# Patient Record
Sex: Female | Born: 1985 | Race: Black or African American | Hispanic: No | Marital: Single | Smoking: Former smoker
Health system: Southern US, Community
[De-identification: ages and names within clinical notes are randomized; demographics above are authoritative.]

## PROBLEM LIST (undated history)

## (undated) DIAGNOSIS — I509 Heart failure, unspecified: Secondary | ICD-10-CM

## (undated) DIAGNOSIS — N049 Nephrotic syndrome with unspecified morphologic changes: Secondary | ICD-10-CM

## (undated) DIAGNOSIS — I10 Essential (primary) hypertension: Secondary | ICD-10-CM

## (undated) DIAGNOSIS — I739 Peripheral vascular disease, unspecified: Secondary | ICD-10-CM

## (undated) DIAGNOSIS — N183 Chronic kidney disease, stage 3 unspecified: Secondary | ICD-10-CM

## (undated) DIAGNOSIS — D638 Anemia in other chronic diseases classified elsewhere: Secondary | ICD-10-CM

## (undated) DIAGNOSIS — J45909 Unspecified asthma, uncomplicated: Secondary | ICD-10-CM

## (undated) DIAGNOSIS — R0600 Dyspnea, unspecified: Secondary | ICD-10-CM

## (undated) DIAGNOSIS — E119 Type 2 diabetes mellitus without complications: Secondary | ICD-10-CM

## (undated) HISTORY — DX: Heart failure, unspecified: I50.9

## (undated) HISTORY — PX: OTHER SURGICAL HISTORY: SHX169

## (undated) HISTORY — PX: TONSILLECTOMY: SUR1361

---
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
## 2004-08-01 ENCOUNTER — Emergency Department: Payer: Self-pay | Admitting: General Practice

## 2005-01-08 ENCOUNTER — Emergency Department: Payer: Self-pay | Admitting: Unknown Physician Specialty

## 2006-01-27 ENCOUNTER — Emergency Department: Payer: Self-pay | Admitting: Emergency Medicine

## 2006-03-01 ENCOUNTER — Emergency Department: Payer: Self-pay | Admitting: General Practice

## 2008-08-07 ENCOUNTER — Emergency Department: Payer: Self-pay | Admitting: Emergency Medicine

## 2010-11-03 ENCOUNTER — Emergency Department: Payer: Self-pay | Admitting: Emergency Medicine

## 2011-04-23 ENCOUNTER — Emergency Department: Payer: Self-pay | Admitting: Internal Medicine

## 2011-06-09 ENCOUNTER — Emergency Department: Payer: Self-pay | Admitting: Emergency Medicine

## 2011-06-09 IMAGING — US US EXTREM LOW VENOUS*L*
1 series · 17 of 24 positions shown · non-contrast
Comparison: none

REASON FOR EXAM: marked edema
COMMENTS:   LMP: Four weeks ago

PROCEDURE:     US  - US DOPPLER LOW EXTR LEFT  - [DATE]  [DATE]
RESULT:     Comparison: None

[Series 1: us extrem low venous*left* · 17 of 26 slices shown]
[im 1/26]
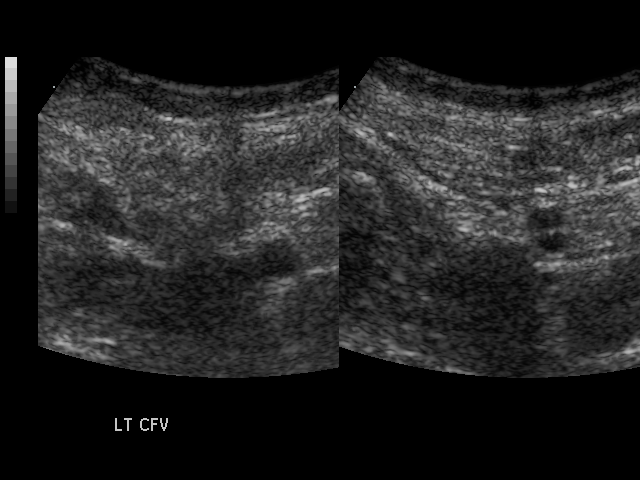
[im 3/26]
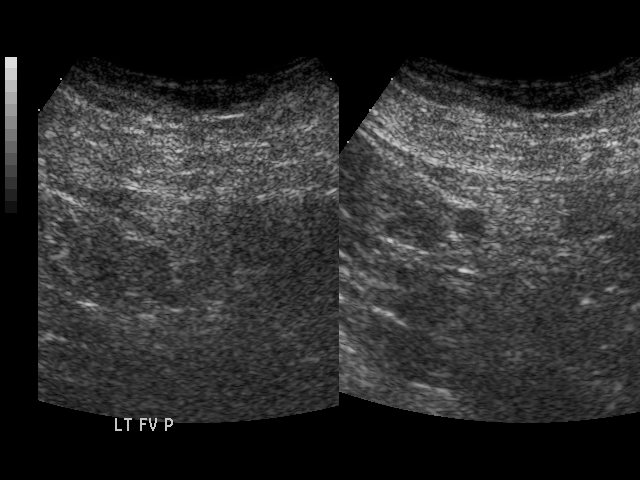
[im 4/26]
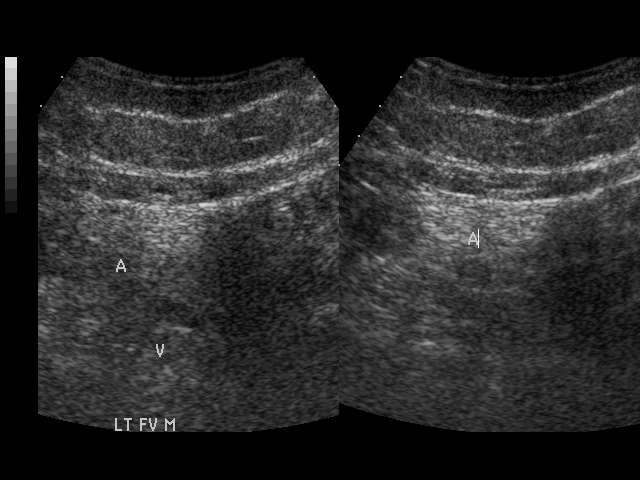
[im 5/26]
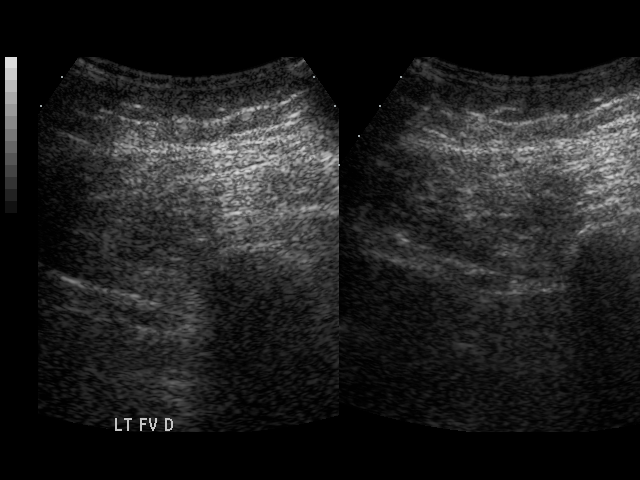
[im 7/26]
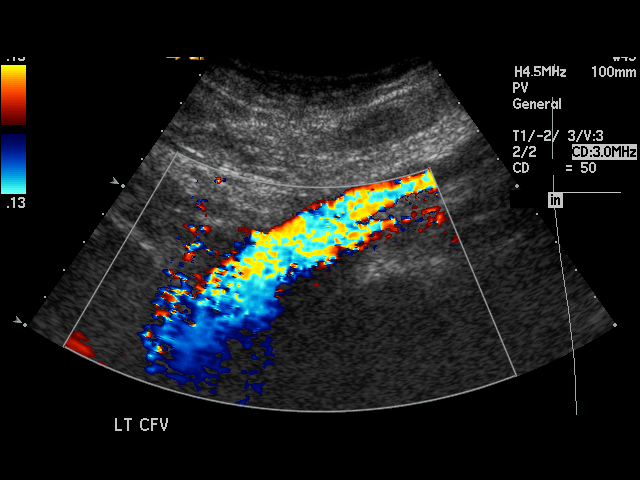
[im 8/26]
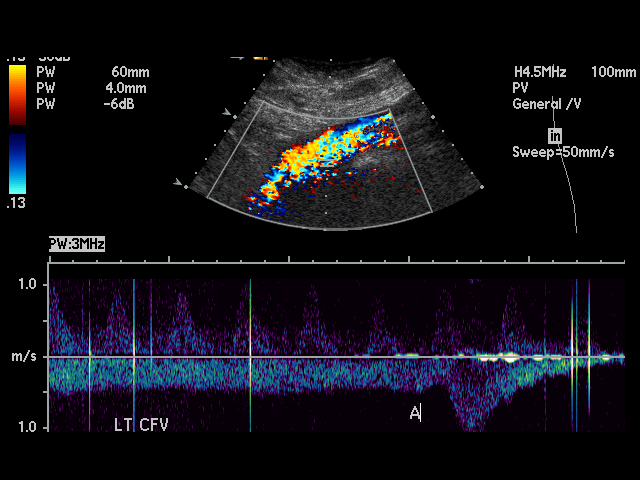
[im 10/26]
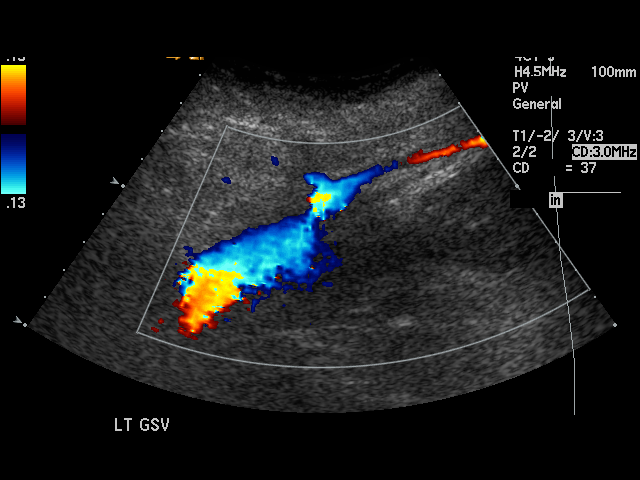
[im 11/26]
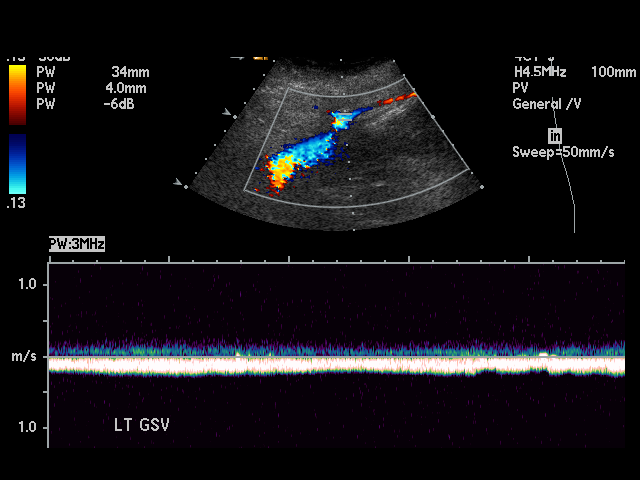
[im 14/26]
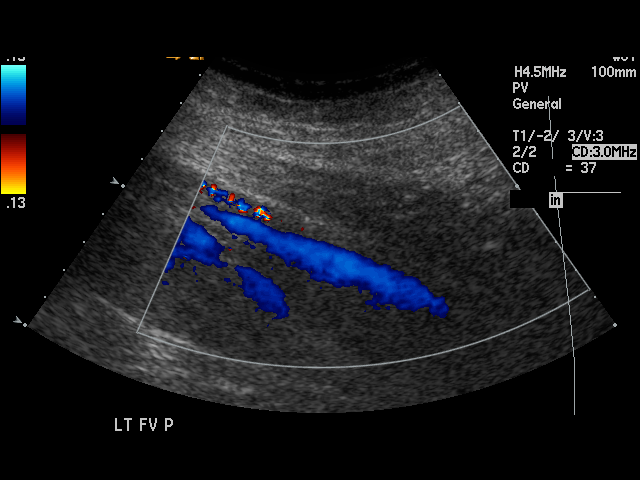
[im 15/26]
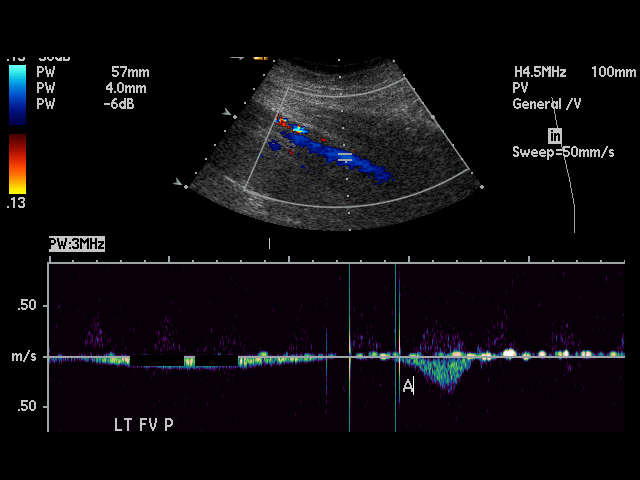
[im 16/26]
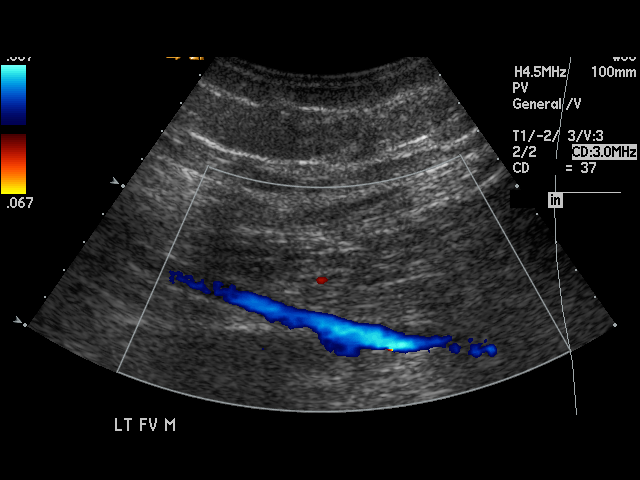
[im 18/26]
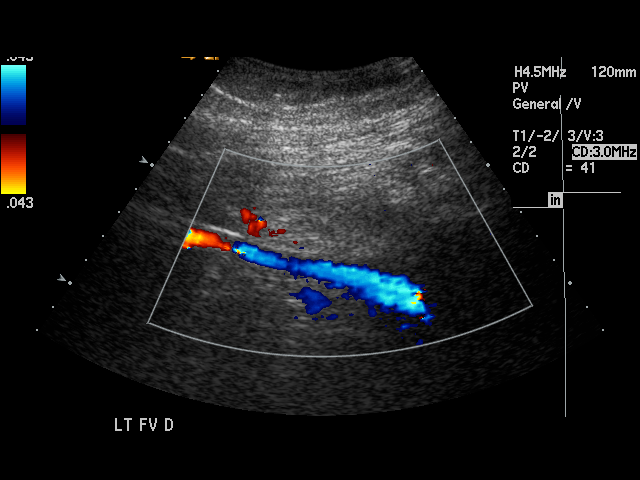
[im 19/26]
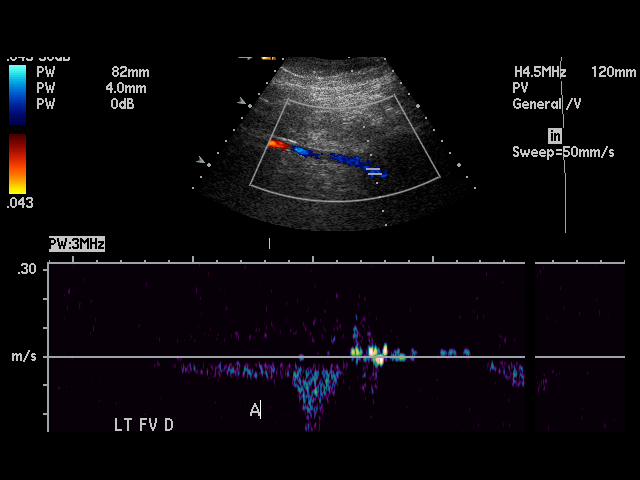
[im 21/26]
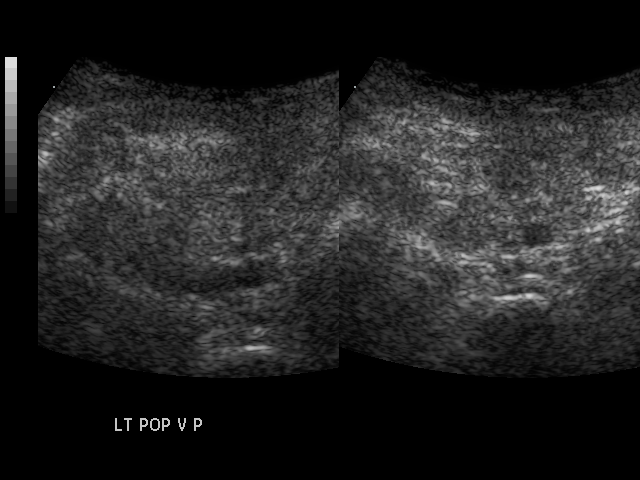
[im 22/26]
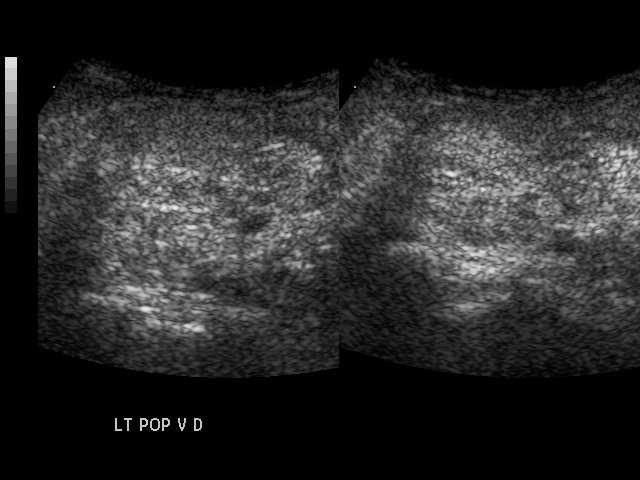
[im 23/26]
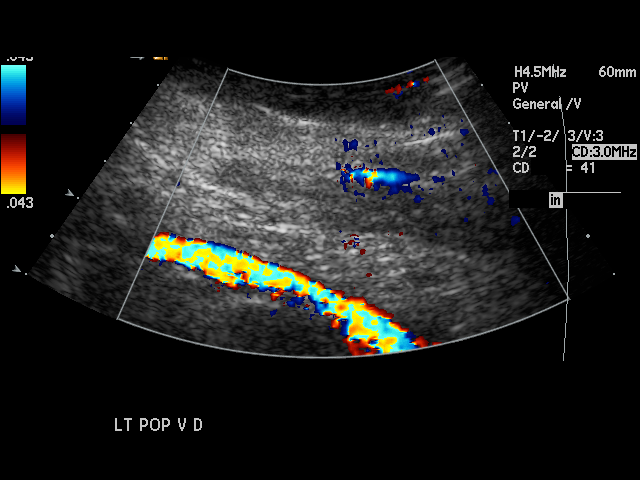
[im 26/26]
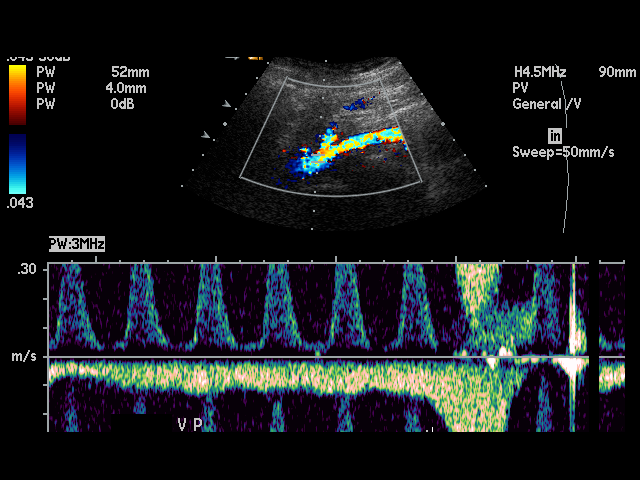

[17 of 24 positions shown; findings below may reference images not displayed]

FINDINGS: Multiple longitudinal and transverse gray-scale as well as color
and spectral Doppler images of the left lower extremity veins were obtained
from the common femoral veins through the popliteal veins.

The left common femoral, greater saphenous, femoral, popliteal veins, and
venous trifurcation are patent, demonstrating normal color-flow and
compressibility. No intraluminal thrombus is identified.There is normal
respiratory variation and augmentation demonstrated at all vein levels.
IMPRESSION: No evidence of DVT in the left lower extremity.

## 2011-07-02 ENCOUNTER — Emergency Department: Payer: Self-pay | Admitting: Emergency Medicine

## 2011-07-02 IMAGING — US US EXTREM LOW VENOUS*L*
1 series · 18 of 21 positions shown · non-contrast
Comparison: none

REASON FOR EXAM: Persistent pain, swelling
COMMENTS:

PROCEDURE:     US  - US DOPPLER LOW EXTR LEFT  - [DATE]  [DATE]
RESULT:     Left lower extremity color flow duplex Doppler reveals no
evidence of venous thrombosis.

[Series 1: us extrem low venous*left* · 18 of 21 slices shown]
[im 1/21]
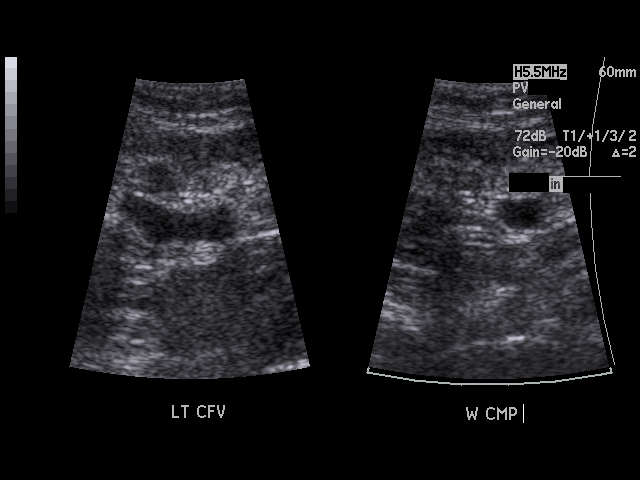
[im 2/21]
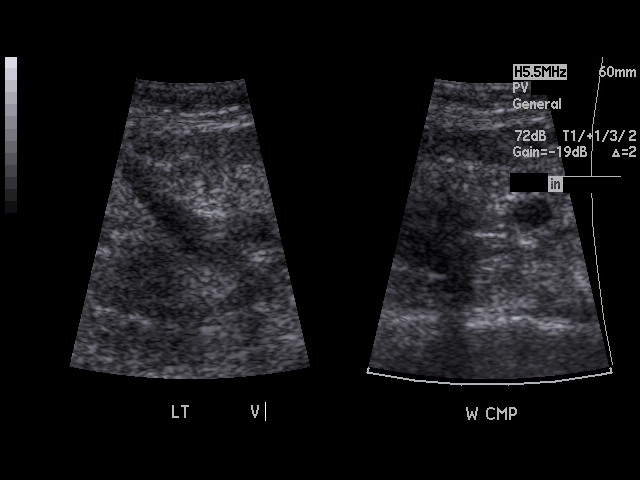
[im 3/21]
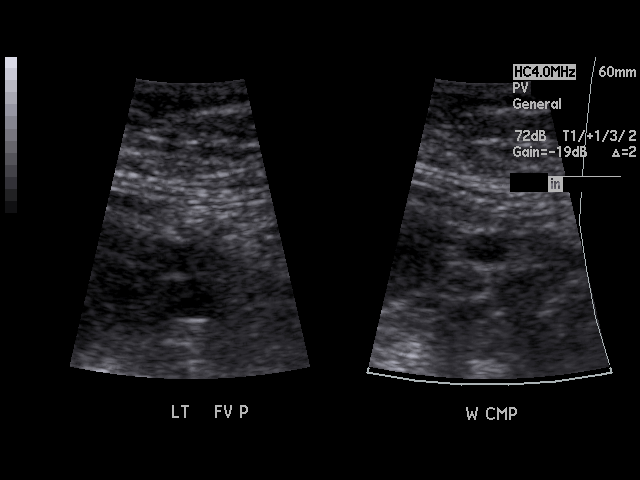
[im 5/21]
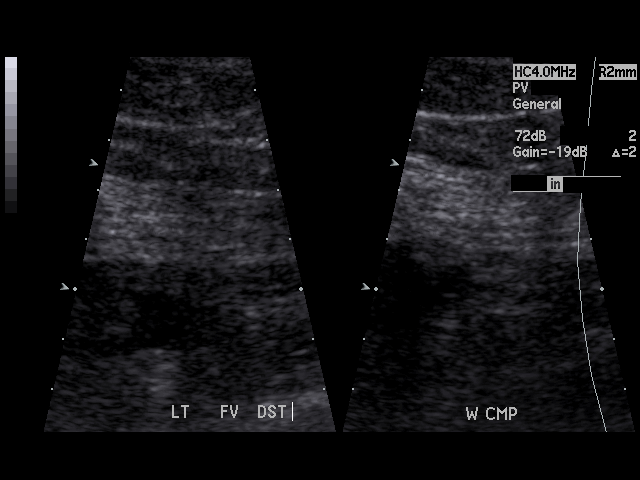
[im 6/21]
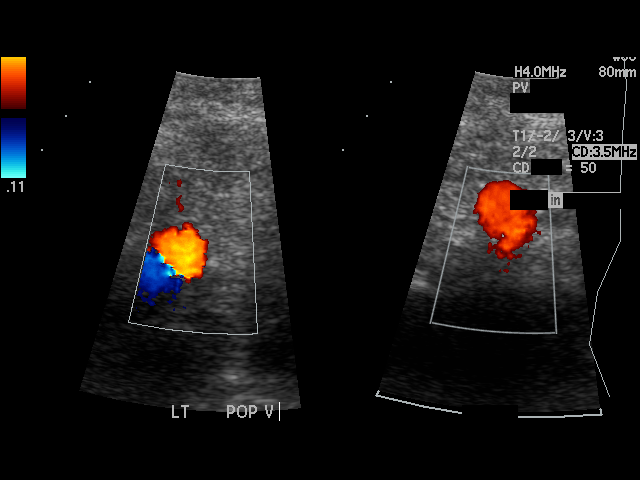
[im 7/21]
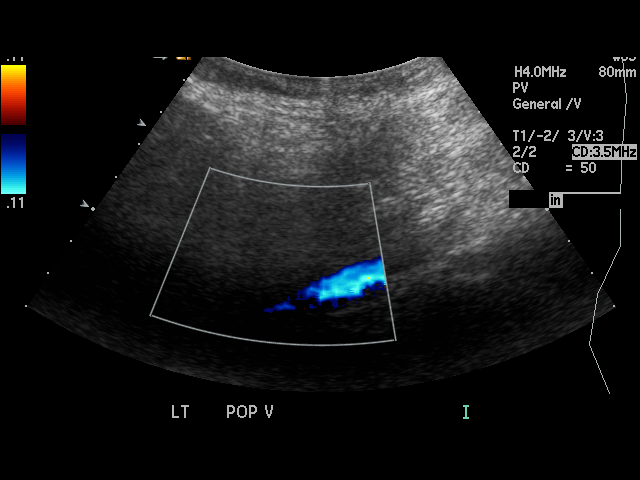
[im 8/21]
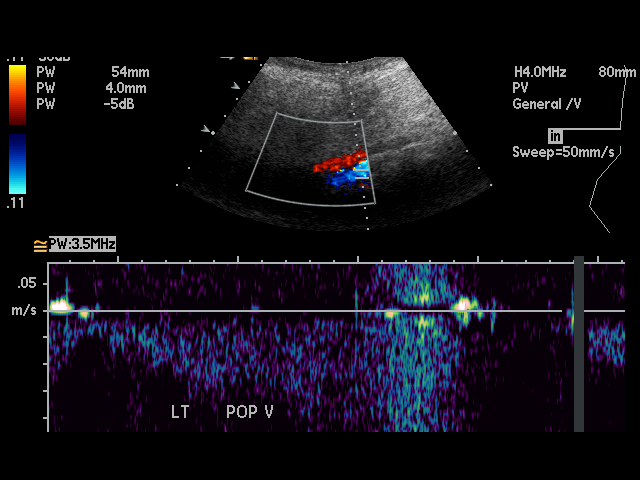
[im 9/21]
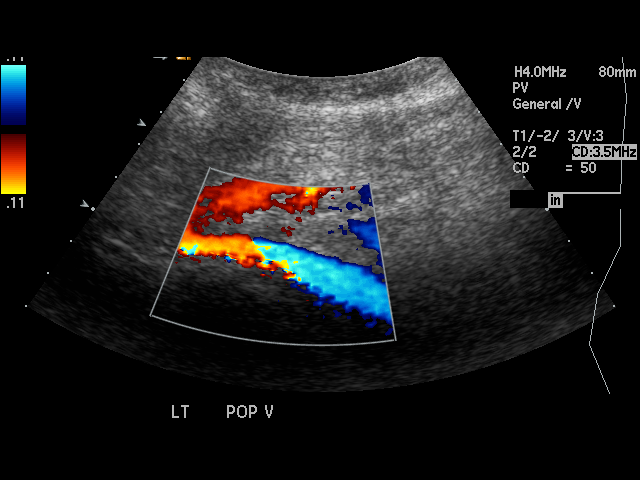
[im 10/21]
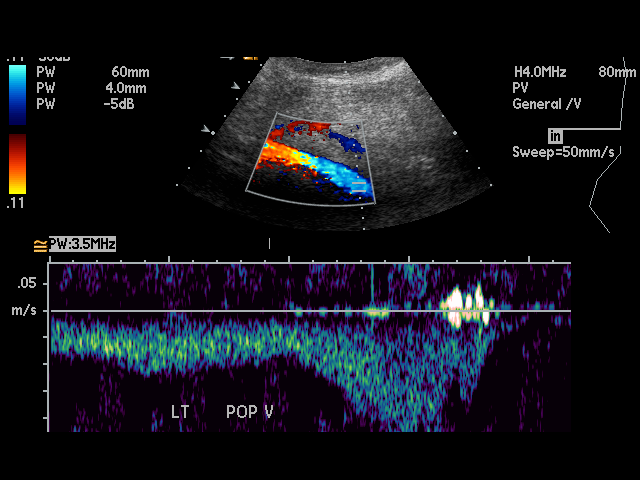
[im 12/21]
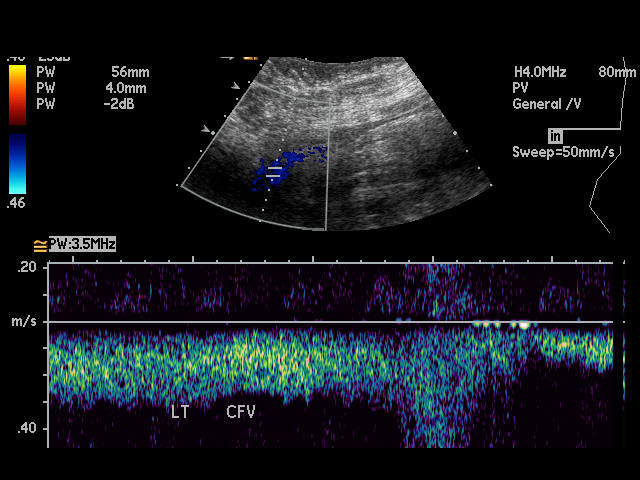
[im 13/21]
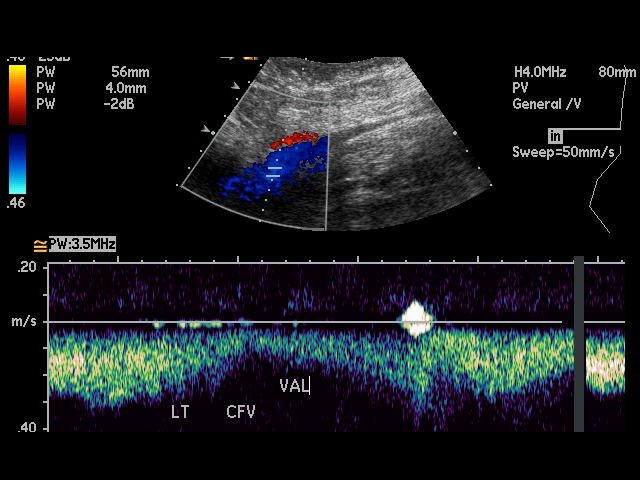
[im 14/21]
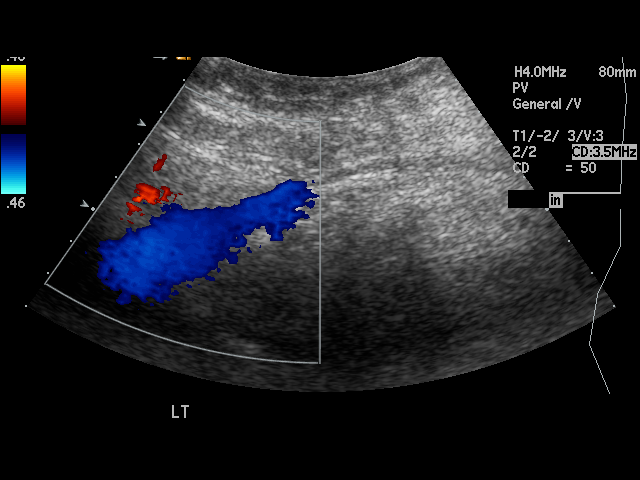
[im 15/21]
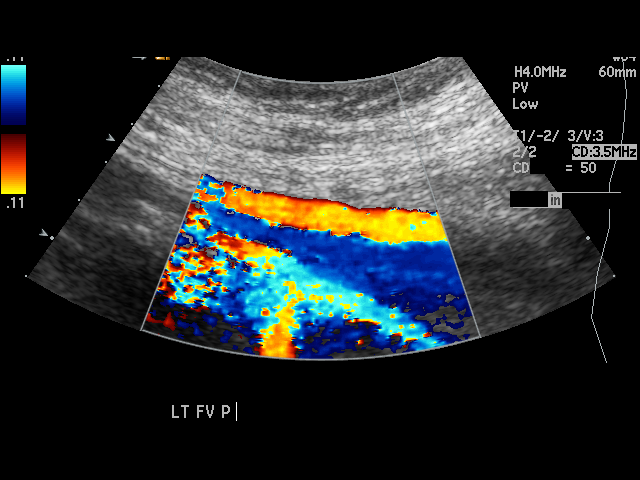
[im 16/21]
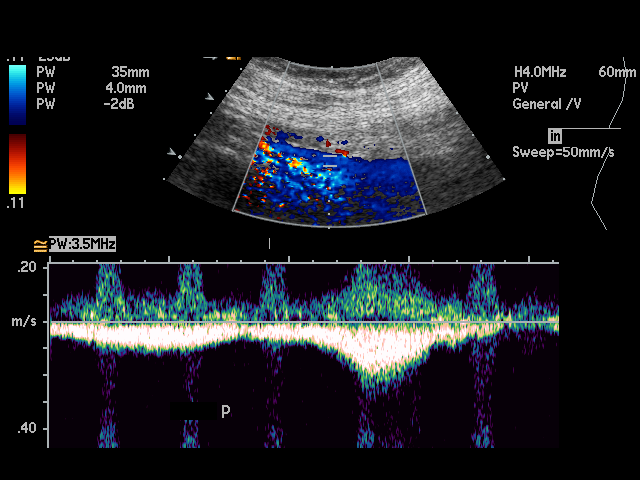
[im 17/21]
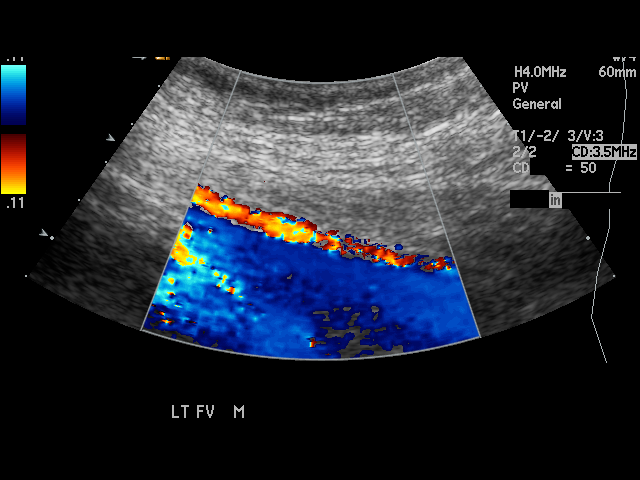
[im 19/21]
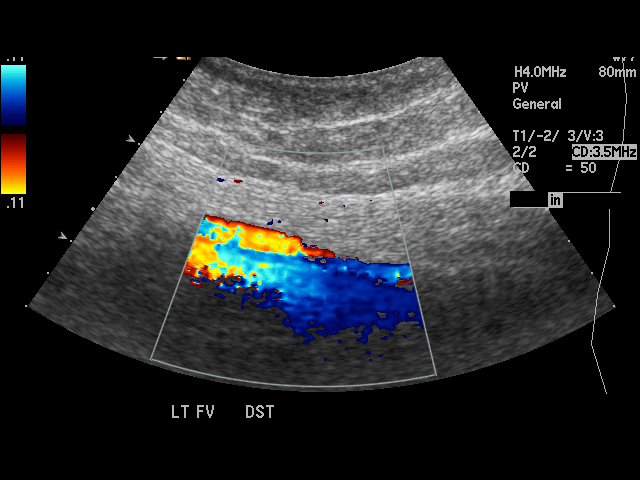
[im 20/21]
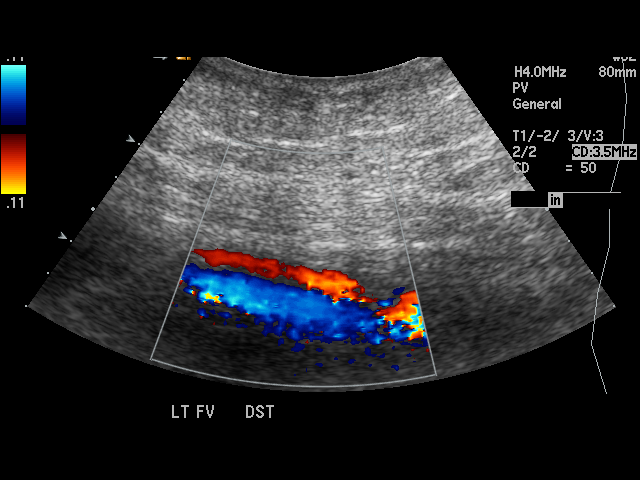
[im 21/21]
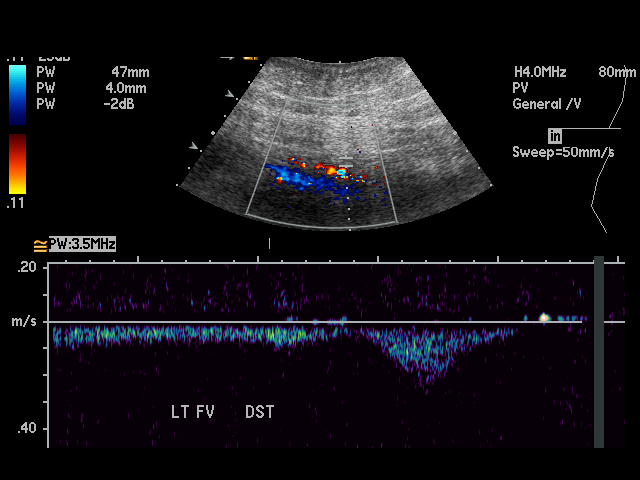

[18 of 21 positions shown; findings below may reference images not displayed]

IMPRESSION: Negative exam.

## 2011-08-08 ENCOUNTER — Emergency Department: Payer: Self-pay | Admitting: Emergency Medicine

## 2013-02-26 ENCOUNTER — Emergency Department: Payer: Self-pay | Admitting: Emergency Medicine

## 2013-12-31 ENCOUNTER — Emergency Department: Payer: Self-pay | Admitting: Emergency Medicine

## 2013-12-31 LAB — COMPREHENSIVE METABOLIC PANEL
ANION GAP: 4 — AB (ref 7–16)
Albumin: 3.1 g/dL — ABNORMAL LOW (ref 3.4–5.0)
Alkaline Phosphatase: 98 U/L
BILIRUBIN TOTAL: 0.4 mg/dL (ref 0.2–1.0)
BUN: 19 mg/dL — AB (ref 7–18)
CREATININE: 2.12 mg/dL — AB (ref 0.60–1.30)
Calcium, Total: 8.5 mg/dL (ref 8.5–10.1)
Chloride: 105 mmol/L (ref 98–107)
Co2: 26 mmol/L (ref 21–32)
EGFR (African American): 36 — ABNORMAL LOW
EGFR (Non-African Amer.): 31 — ABNORMAL LOW
Glucose: 243 mg/dL — ABNORMAL HIGH (ref 65–99)
OSMOLALITY: 280 (ref 275–301)
POTASSIUM: 3.9 mmol/L (ref 3.5–5.1)
SGOT(AST): 21 U/L (ref 15–37)
SGPT (ALT): 26 U/L (ref 12–78)
SODIUM: 135 mmol/L — AB (ref 136–145)
Total Protein: 7.4 g/dL (ref 6.4–8.2)

## 2013-12-31 LAB — CBC
HCT: 38.5 % (ref 35.0–47.0)
HGB: 12.5 g/dL (ref 12.0–16.0)
MCH: 27.5 pg (ref 26.0–34.0)
MCHC: 32.6 g/dL (ref 32.0–36.0)
MCV: 84 fL (ref 80–100)
Platelet: 239 10*3/uL (ref 150–440)
RBC: 4.57 10*6/uL (ref 3.80–5.20)
RDW: 15.2 % — ABNORMAL HIGH (ref 11.5–14.5)
WBC: 13.1 10*3/uL — AB (ref 3.6–11.0)

## 2013-12-31 LAB — HCG, QUANTITATIVE, PREGNANCY: Beta Hcg, Quant.: <1 m[IU]/mL — ABNORMAL LOW

## 2016-02-11 ENCOUNTER — Inpatient Hospital Stay
Admission: EM | Admit: 2016-02-11 | Discharge: 2016-02-16 | DRG: 291 | Disposition: A | Discharge status: Home / Self Care | Payer: Managed Care, Other (non HMO) | Attending: Internal Medicine | Admitting: Internal Medicine

## 2016-02-11 ENCOUNTER — Encounter: Payer: Self-pay | Admitting: Emergency Medicine

## 2016-02-11 ENCOUNTER — Inpatient Hospital Stay: Payer: Managed Care, Other (non HMO)

## 2016-02-11 ENCOUNTER — Emergency Department: Payer: Managed Care, Other (non HMO)

## 2016-02-11 DIAGNOSIS — N289 Disorder of kidney and ureter, unspecified: Secondary | ICD-10-CM | POA: Diagnosis not present

## 2016-02-11 DIAGNOSIS — Z8249 Family history of ischemic heart disease and other diseases of the circulatory system: Secondary | ICD-10-CM | POA: Diagnosis not present

## 2016-02-11 DIAGNOSIS — E8770 Fluid overload, unspecified: Secondary | ICD-10-CM | POA: Diagnosis not present

## 2016-02-11 DIAGNOSIS — J45909 Unspecified asthma, uncomplicated: Secondary | ICD-10-CM | POA: Diagnosis present

## 2016-02-11 DIAGNOSIS — Z6841 Body Mass Index (BMI) 40.0 and over, adult: Secondary | ICD-10-CM | POA: Diagnosis not present

## 2016-02-11 DIAGNOSIS — R609 Edema, unspecified: Secondary | ICD-10-CM | POA: Diagnosis not present

## 2016-02-11 DIAGNOSIS — D509 Iron deficiency anemia, unspecified: Secondary | ICD-10-CM | POA: Diagnosis present

## 2016-02-11 DIAGNOSIS — N920 Excessive and frequent menstruation with regular cycle: Secondary | ICD-10-CM | POA: Diagnosis not present

## 2016-02-11 DIAGNOSIS — I5033 Acute on chronic diastolic (congestive) heart failure: Secondary | ICD-10-CM | POA: Diagnosis present

## 2016-02-11 DIAGNOSIS — D631 Anemia in chronic kidney disease: Secondary | ICD-10-CM | POA: Diagnosis not present

## 2016-02-11 DIAGNOSIS — N184 Chronic kidney disease, stage 4 (severe): Secondary | ICD-10-CM | POA: Diagnosis present

## 2016-02-11 DIAGNOSIS — I509 Heart failure, unspecified: Secondary | ICD-10-CM | POA: Insufficient documentation

## 2016-02-11 DIAGNOSIS — N189 Chronic kidney disease, unspecified: Secondary | ICD-10-CM | POA: Insufficient documentation

## 2016-02-11 DIAGNOSIS — E8809 Other disorders of plasma-protein metabolism, not elsewhere classified: Secondary | ICD-10-CM | POA: Diagnosis present

## 2016-02-11 DIAGNOSIS — I130 Hypertensive heart and chronic kidney disease with heart failure and stage 1 through stage 4 chronic kidney disease, or unspecified chronic kidney disease: Secondary | ICD-10-CM | POA: Diagnosis present

## 2016-02-11 DIAGNOSIS — N179 Acute kidney failure, unspecified: Secondary | ICD-10-CM | POA: Diagnosis present

## 2016-02-11 DIAGNOSIS — I160 Hypertensive urgency: Secondary | ICD-10-CM | POA: Diagnosis not present

## 2016-02-11 DIAGNOSIS — I161 Hypertensive emergency: Secondary | ICD-10-CM | POA: Diagnosis present

## 2016-02-11 DIAGNOSIS — I10 Essential (primary) hypertension: Secondary | ICD-10-CM | POA: Diagnosis present

## 2016-02-11 DIAGNOSIS — R600 Localized edema: Secondary | ICD-10-CM | POA: Insufficient documentation

## 2016-02-11 DIAGNOSIS — N049 Nephrotic syndrome with unspecified morphologic changes: Secondary | ICD-10-CM | POA: Insufficient documentation

## 2016-02-11 DIAGNOSIS — R81 Glycosuria: Secondary | ICD-10-CM | POA: Diagnosis present

## 2016-02-11 DIAGNOSIS — R0682 Tachypnea, not elsewhere classified: Secondary | ICD-10-CM

## 2016-02-11 DIAGNOSIS — Z820 Family history of epilepsy and other diseases of the nervous system: Secondary | ICD-10-CM | POA: Diagnosis not present

## 2016-02-11 DIAGNOSIS — N183 Chronic kidney disease, stage 3 (moderate): Secondary | ICD-10-CM | POA: Diagnosis not present

## 2016-02-11 DIAGNOSIS — R000 Tachycardia, unspecified: Secondary | ICD-10-CM | POA: Insufficient documentation

## 2016-02-11 DIAGNOSIS — D6489 Other specified anemias: Secondary | ICD-10-CM

## 2016-02-11 DIAGNOSIS — I248 Other forms of acute ischemic heart disease: Secondary | ICD-10-CM | POA: Diagnosis present

## 2016-02-11 HISTORY — DX: Unspecified asthma, uncomplicated: J45.909

## 2016-02-11 HISTORY — DX: Morbid (severe) obesity due to excess calories: E66.01

## 2016-02-11 HISTORY — DX: Anemia in other chronic diseases classified elsewhere: D63.8

## 2016-02-11 HISTORY — DX: Essential (primary) hypertension: I10

## 2016-02-11 HISTORY — DX: Chronic kidney disease, stage 3 unspecified: N18.30

## 2016-02-11 HISTORY — DX: Chronic kidney disease, stage 3 (moderate): N18.3

## 2016-02-11 LAB — CBC
HCT: 26.1 % — ABNORMAL LOW (ref 35.0–47.0)
HEMOGLOBIN: 7.1 g/dL — AB (ref 12.0–16.0)
MCH: 16.2 pg — ABNORMAL LOW (ref 26.0–34.0)
MCHC: 27.3 g/dL — ABNORMAL LOW (ref 32.0–36.0)
MCV: 59.4 fL — AB (ref 80.0–100.0)
PLATELETS: 292 10*3/uL (ref 150–440)
RBC: 4.4 MIL/uL (ref 3.80–5.20)
RDW: 21.4 % — ABNORMAL HIGH (ref 11.5–14.5)
WBC: 12.9 10*3/uL — AB (ref 3.6–11.0)

## 2016-02-11 LAB — URINALYSIS COMPLETE WITH MICROSCOPIC (ARMC ONLY)
BACTERIA UA: NONE SEEN
Bilirubin Urine: NEGATIVE
Glucose, UA: 50 mg/dL — AB
Hgb urine dipstick: NEGATIVE
Ketones, ur: NEGATIVE mg/dL
Leukocytes, UA: NEGATIVE
Nitrite: NEGATIVE
RBC / HPF: NONE SEEN RBC/hpf (ref 0–5)
SPECIFIC GRAVITY, URINE: 1.011 (ref 1.005–1.030)
pH: 5 (ref 5.0–8.0)

## 2016-02-11 LAB — PROTEIN / CREATININE RATIO, URINE
Creatinine, Urine: 22 mg/dL
Protein Creatinine Ratio: 4 mg/mg{Cre} — ABNORMAL HIGH (ref 0.00–0.15)
TOTAL PROTEIN, URINE: 88 mg/dL

## 2016-02-11 LAB — HEPATIC FUNCTION PANEL
ALBUMIN: 3.1 g/dL — AB (ref 3.5–5.0)
ALK PHOS: 67 U/L (ref 38–126)
ALT: 13 U/L — AB (ref 14–54)
AST: 19 U/L (ref 15–41)
Bilirubin, Direct: 0.3 mg/dL (ref 0.1–0.5)
Indirect Bilirubin: 0.6 mg/dL (ref 0.3–0.9)
TOTAL PROTEIN: 6.8 g/dL (ref 6.5–8.1)
Total Bilirubin: 0.9 mg/dL (ref 0.3–1.2)

## 2016-02-11 LAB — TROPONIN I
TROPONIN I: 0.03 ng/mL (ref <0.031)
Troponin I: 0.03 ng/mL (ref <0.031)
Troponin I: 0.05 ng/mL — ABNORMAL HIGH (ref <0.031)
Troponin I: 0.2 ng/mL — ABNORMAL HIGH (ref <0.031)

## 2016-02-11 LAB — IRON AND TIBC
IRON: 13 ug/dL — AB (ref 28–170)
Saturation Ratios: 3 % — ABNORMAL LOW (ref 10.4–31.8)
TIBC: 506 ug/dL — AB (ref 250–450)
UIBC: 493 ug/dL

## 2016-02-11 LAB — BRAIN NATRIURETIC PEPTIDE: B Natriuretic Peptide: 469 pg/mL — ABNORMAL HIGH (ref 0.0–100.0)

## 2016-02-11 LAB — BASIC METABOLIC PANEL
ANION GAP: 8 (ref 5–15)
BUN: 39 mg/dL — ABNORMAL HIGH (ref 6–20)
CALCIUM: 8.3 mg/dL — AB (ref 8.9–10.3)
CO2: 20 mmol/L — AB (ref 22–32)
Chloride: 110 mmol/L (ref 101–111)
Creatinine, Ser: 3.08 mg/dL — ABNORMAL HIGH (ref 0.44–1.00)
GFR, EST AFRICAN AMERICAN: 22 mL/min — AB (ref >60)
GFR, EST NON AFRICAN AMERICAN: 19 mL/min — AB (ref >60)
Glucose, Bld: 116 mg/dL — ABNORMAL HIGH (ref 65–99)
Potassium: 3.7 mmol/L (ref 3.5–5.1)
Sodium: 138 mmol/L (ref 135–145)

## 2016-02-11 LAB — FERRITIN: FERRITIN: 9 ng/mL — AB (ref 11–307)

## 2016-02-11 LAB — TSH: TSH: 2.125 u[IU]/mL (ref 0.350–4.500)

## 2016-02-11 LAB — VITAMIN B12: Vitamin B-12: 352 pg/mL (ref 180–914)

## 2016-02-11 LAB — POCT PREGNANCY, URINE: PREG TEST UR: NEGATIVE

## 2016-02-11 LAB — FOLATE: Folate: 8.4 ng/mL (ref >5.9)

## 2016-02-11 LAB — HEMOGLOBIN A1C: Hgb A1c MFr Bld: 5.9 % (ref 4.0–6.0)

## 2016-02-11 IMAGING — CR DG CHEST 2V
1 series · 2 of 2 positions shown · non-contrast
Comparison: None available for comparison at time of study
interpretation.

CLINICAL DATA: LEFT leg swelling and drainage for 1 week. Diagnosed
with cellulitis. History of hypertension.

EXAM:
CHEST  2 VIEW

[Series 1: w chest pa · 0.14mm/px · 2 of 2 slices shown]
[im 1/2]
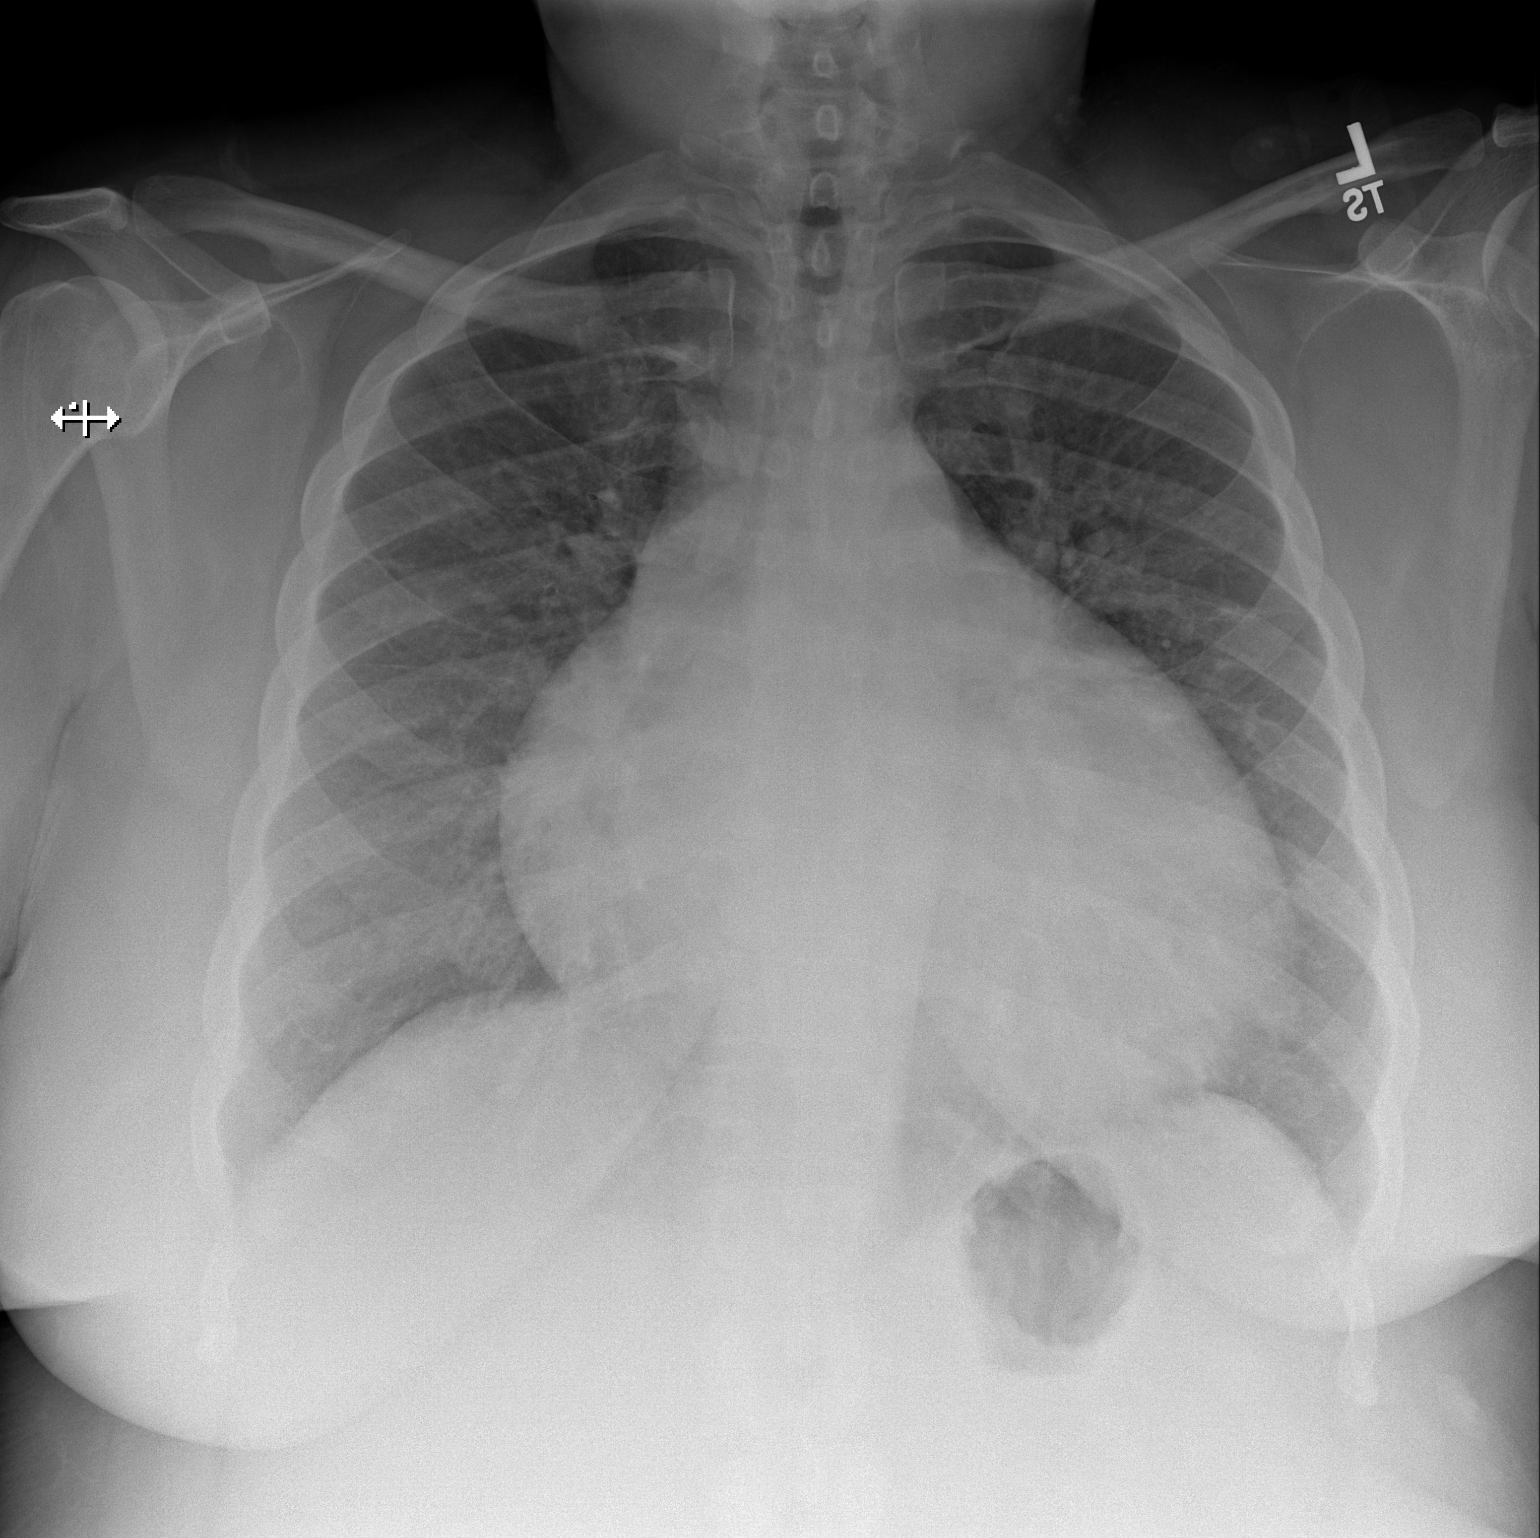
[im 2/2]
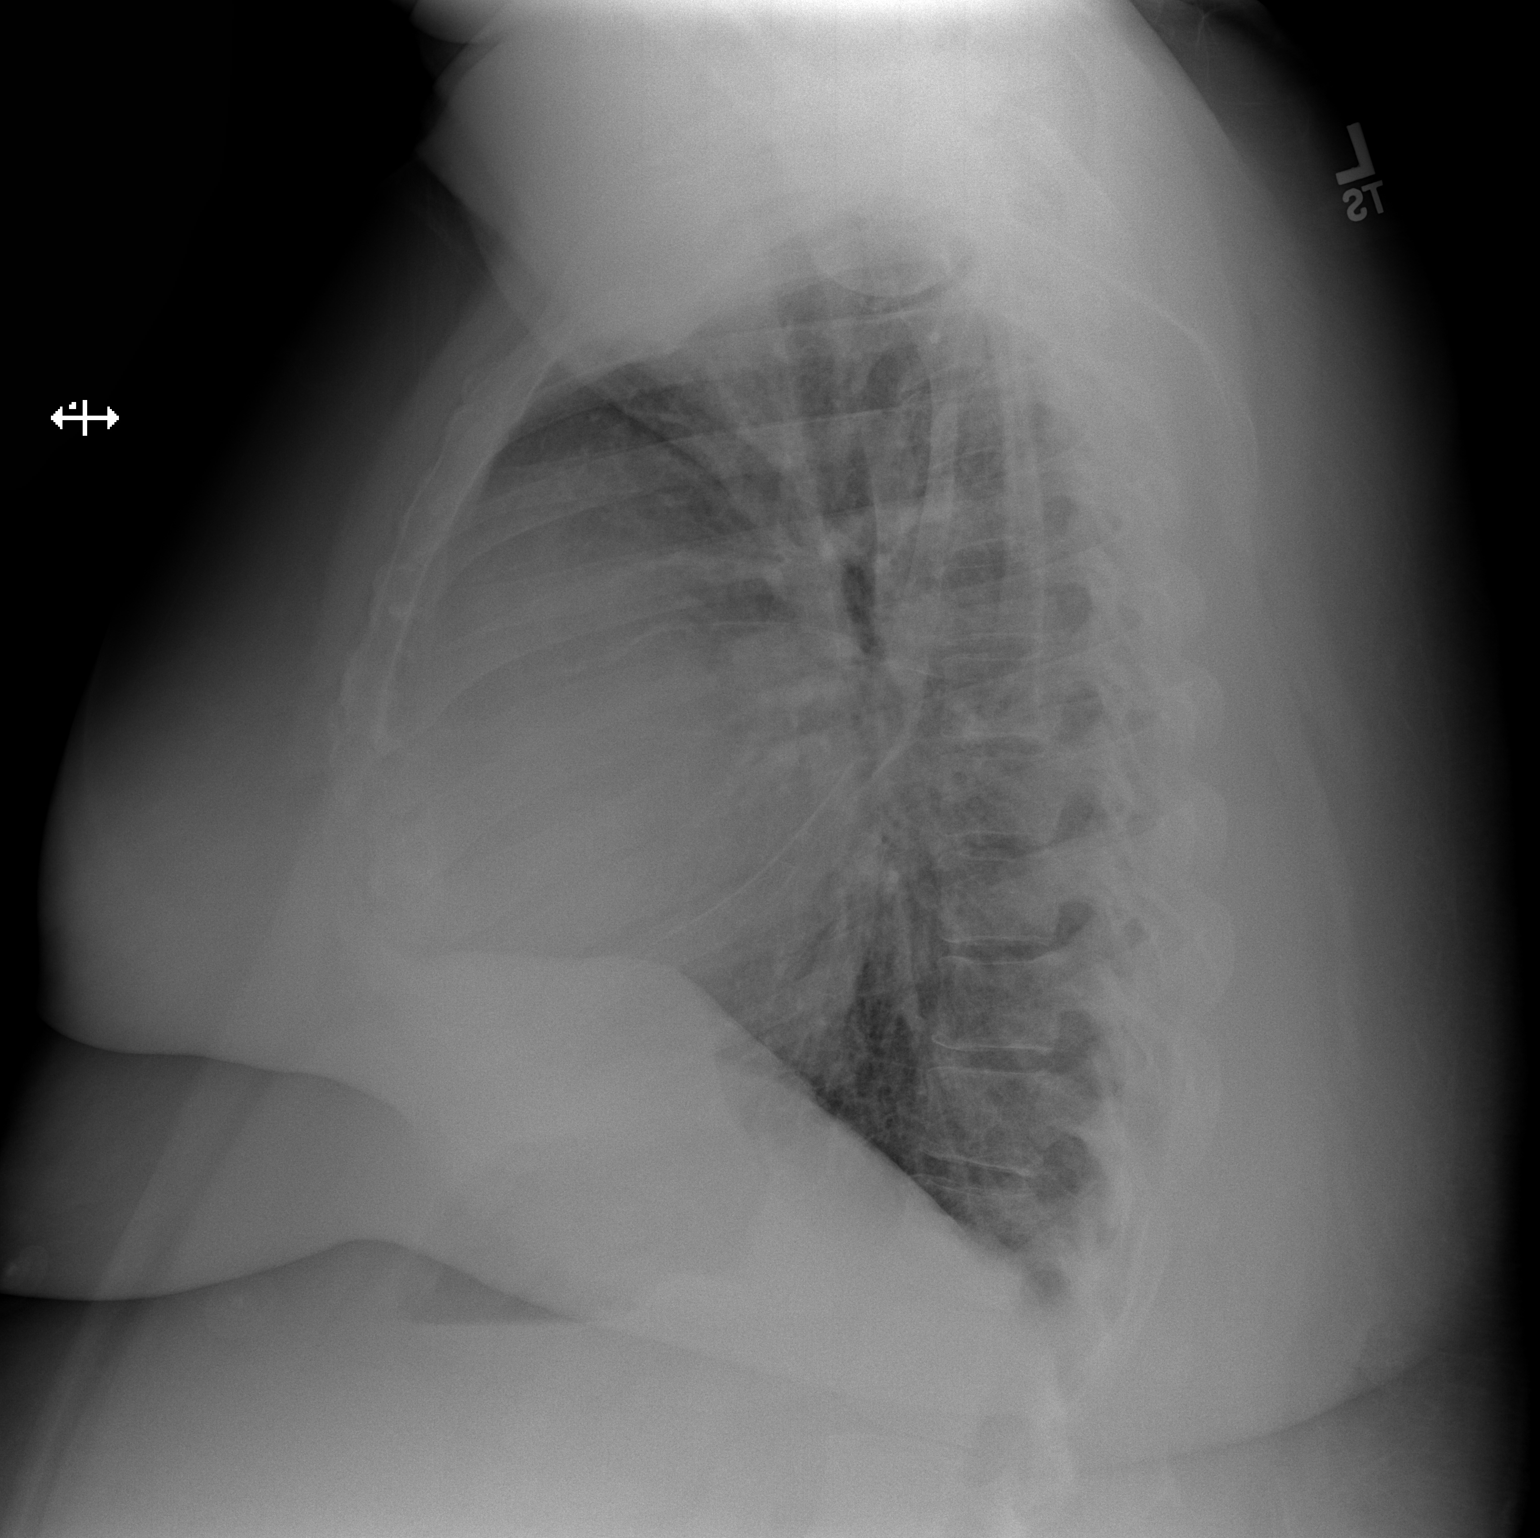

[2 of 2 positions shown; findings below may reference images not displayed]

FINDINGS: The cardiac silhouette is moderately enlarged, mediastinal
silhouette is nonsuspicious. Pulmonary vascular congestion without
pleural effusion or focal consolidation. No pneumothorax. Large body
habitus. Osseous structures are nonsuspicious.
IMPRESSION: Moderate cardiomegaly and pulmonary vascular congestion.

## 2016-02-11 MED ORDER — ASPIRIN EC 81 MG PO TBEC
81.0000 mg | DELAYED_RELEASE_TABLET | Freq: Every day | ORAL | Status: DC
  Administered 2016-02-11 – 2016-02-13 (×3): 81 mg via ORAL
  Filled 2016-02-11 (×3): qty 1

## 2016-02-11 MED ORDER — SODIUM CHLORIDE 0.9% FLUSH
3.0000 mL | Freq: Two times a day (BID) | INTRAVENOUS | Status: DC
  Administered 2016-02-11 – 2016-02-16 (×8): 3 mL via INTRAVENOUS

## 2016-02-11 MED ORDER — HYDRALAZINE HCL 20 MG/ML IJ SOLN
10.0000 mg | Freq: Once | INTRAMUSCULAR | Status: AC
  Administered 2016-02-11: 10 mg via INTRAVENOUS

## 2016-02-11 MED ORDER — HEPARIN SODIUM (PORCINE) 5000 UNIT/ML IJ SOLN
5000.0000 [IU] | Freq: Three times a day (TID) | INTRAMUSCULAR | Status: DC
  Administered 2016-02-11 – 2016-02-12 (×2): 5000 [IU] via SUBCUTANEOUS
  Filled 2016-02-11 (×3): qty 1

## 2016-02-11 MED ORDER — SODIUM CHLORIDE 0.9 % IV SOLN: 250.0000 mL | INTRAVENOUS | Status: DC | PRN

## 2016-02-11 MED ORDER — SODIUM CHLORIDE 0.9% FLUSH: 3.0000 mL | INTRAVENOUS | Status: DC | PRN

## 2016-02-11 MED ORDER — ACETAMINOPHEN 325 MG PO TABS
650.0000 mg | ORAL_TABLET | ORAL | Status: DC | PRN
  Administered 2016-02-11 – 2016-02-15 (×3): 650 mg via ORAL
  Filled 2016-02-11 (×3): qty 2

## 2016-02-11 MED ORDER — NITROGLYCERIN 2 % TD OINT
1.0000 [in_us] | TOPICAL_OINTMENT | Freq: Once | TRANSDERMAL | Status: AC
  Administered 2016-02-11: 1 [in_us] via TOPICAL

## 2016-02-11 MED ORDER — RAMIPRIL 2.5 MG PO CAPS: 2.5000 mg | ORAL_CAPSULE | Freq: Every day | ORAL | Status: DC

## 2016-02-11 MED ORDER — CARVEDILOL 6.25 MG PO TABS
6.2500 mg | ORAL_TABLET | Freq: Two times a day (BID) | ORAL | Status: DC
  Administered 2016-02-11 – 2016-02-12 (×2): 6.25 mg via ORAL
  Filled 2016-02-11 (×2): qty 1

## 2016-02-11 MED ORDER — AMLODIPINE BESYLATE 5 MG PO TABS
5.0000 mg | ORAL_TABLET | Freq: Every day | ORAL | Status: DC
  Administered 2016-02-11: 5 mg via ORAL
  Filled 2016-02-11: qty 1

## 2016-02-11 MED ORDER — ONDANSETRON HCL 4 MG/2ML IJ SOLN: 4.0000 mg | Freq: Four times a day (QID) | INTRAMUSCULAR | Status: DC | PRN

## 2016-02-11 MED ORDER — OXYCODONE-ACETAMINOPHEN 5-325 MG PO TABS
1.0000 | ORAL_TABLET | Freq: Four times a day (QID) | ORAL | Status: DC | PRN
  Administered 2016-02-11 – 2016-02-12 (×3): 1 via ORAL
  Filled 2016-02-11 (×3): qty 1

## 2016-02-11 MED ORDER — FUROSEMIDE 10 MG/ML IJ SOLN
40.0000 mg | Freq: Two times a day (BID) | INTRAMUSCULAR | Status: DC
  Administered 2016-02-11: 40 mg via INTRAVENOUS
  Filled 2016-02-11: qty 4

## 2016-02-11 MED ORDER — HYDRALAZINE HCL 20 MG/ML IJ SOLN
10.0000 mg | INTRAMUSCULAR | Status: DC | PRN
  Administered 2016-02-11 – 2016-02-12 (×4): 10 mg via INTRAVENOUS
  Filled 2016-02-11 (×5): qty 1

## 2016-02-11 MED ORDER — FUROSEMIDE 10 MG/ML IJ SOLN
20.0000 mg | Freq: Once | INTRAMUSCULAR | Status: AC
  Administered 2016-02-11: 20 mg via INTRAVENOUS
  Filled 2016-02-11: qty 4

## 2016-02-11 MED ORDER — NITROGLYCERIN 2 % TD OINT
TOPICAL_OINTMENT | TRANSDERMAL | Status: AC
  Filled 2016-02-11: qty 1

## 2016-02-11 NOTE — ED Notes (Signed)
Pt states left leg is more swollen to her than right. Pt complains of "leaking" from left lateral lower leg. Pt states history of same in past with diagnosis of cellulitis. No redness to extremities noted. Pt states has had intermittent shob with exertion and lying down. Pt denies cough, known fever.

## 2016-02-11 NOTE — Care Management Note (Signed)
Case Management Note  Patient Details  Name: [name] MRN: [id number] Date of Birth: [birth year]  Subjective/Objective:   CM assessment for new CHF. Met with patient at bedside. She works, is independent and active. Lives at home alone. Denies issues obtaining medications, copays. No PCP. List of accepting physicians given to patients.  Appointment scheduled with CHF clinic for /1:00 PM. Placed on discharge follow up instructions.   Action/Plan: Follow up with CHF clinic and new PCP.   Expected Discharge Date:                  Expected Discharge Plan:  Home/Self Care  In-House Referral:     Discharge [place] not met per provider, HF Clinic  Post Acute Care Choice:    Choice offered to:     DME Arranged:    DME Agency:     HH Arranged:    HH Agency:     Status of Service:  Completed, signed off  Medicare Important Message Given:    Date Medicare IM Given:    Medicare IM give by:    Date Additional Medicare IM Given:    Additional Medicare Important Message give by:     If discussed at [place] of Stay Meetings, dates discussed:    Additional Comments:  Jolly Mango, RN 02/11/2016, 9:49 AM

## 2016-02-11 NOTE — Consult Note (Signed)
Date: 02/11/2016                  Patient Name:  [name]  MRN: [id number]  DOB: 07/10/1986  Age / Sex: 30 y.o., female         PCP: No PCP Per Patient                 Service Requesting Consult: Internal medicine                 Reason for Consult: ARF, CKD, Edema            History of Present Illness: Patient is a 30 y.o. female with medical problems of HTN, bronchial asthma, chronic kidney disease  who was admitted to [facility] on 02/11/2016 for evaluation of evaluation of worsening edema.  Patient does not have a primary care physician.  She used to take hydrochlorothiazide for hypertension but has not taken it in a long time because of no medical care. Workup so far shows that urinalysis has glucosuria and protenuria Her creatinine is elevated at 3.08.  GFR 22. Only known previous value is 2.12 from March 2015 with a GFR was 36 She is also found to be severely anemic with hemoglobin of 26.1, MCV of 59.4 Suggesting microcytic anemia   Medications: Outpatient medications: Prescriptions prior to admission  Medication Sig Dispense Refill Last Dose  . hydrochlorothiazide (HYDRODIURIL) 25 MG tablet Take 25 mg by mouth daily.   02/10/2016 at 1900    Current medications: Current Facility-Administered Medications  Medication Dose Route Frequency Provider Last Rate Last Dose  . 0.9 %  sodium chloride infusion  250 mL Intravenous PRN Saundra Shelling, MD      . acetaminophen (TYLENOL) tablet 650 mg  650 mg Oral Q4H PRN Saundra Shelling, MD   650 mg at 02/11/16 0800  . amLODipine (NORVASC) tablet 5 mg  5 mg Oral Daily Ryan M Dunn, PA-C   5 mg at 02/11/16 1050  . aspirin EC tablet 81 mg  81 mg Oral Daily Pavan Pyreddy, MD   81 mg at 02/11/16 1030  . carvedilol (COREG) tablet 6.25 mg  6.25 mg Oral BID WC Pavan Pyreddy, MD   6.25 mg at 02/11/16 1030  . heparin injection 5,000 Units  5,000 Units Subcutaneous Q8H Pavan Pyreddy, MD   5,000 Units at 02/11/16 1030  . hydrALAZINE (APRESOLINE)  injection 10 mg  10 mg Intravenous Q4H PRN Saundra Shelling, MD   10 mg at 02/11/16 1059  . ondansetron (ZOFRAN) injection 4 mg  4 mg Intravenous Q6H PRN Saundra Shelling, MD      . oxyCODONE-acetaminophen (PERCOCET/ROXICET) 5-325 MG per tablet 1 tablet  1 tablet Oral Q6H PRN Dustin Flock, MD   1 tablet at 02/11/16 1148  . sodium chloride flush (NS) 0.9 % injection 3 mL  3 mL Intravenous Q12H Pavan Pyreddy, MD   3 mL at 02/11/16 1030  . sodium chloride flush (NS) 0.9 % injection 3 mL  3 mL Intravenous PRN Saundra Shelling, MD          Allergies: No Known Allergies    Past Medical History: Past Medical History  Diagnosis Date  . Hypertension   . Asthma   . CKD (chronic kidney disease), stage III   . Anemia of chronic disease   . Morbid obesity ([place])      Past Surgical History: Past Surgical History  Procedure Laterality Date  . Tonsillectomy  Family History: Family History  Problem Relation Age of Onset  . Hypertension Mother   . Multiple sclerosis Mother      Social History: Social History   Social History  . Marital Status: Single    Spouse Name: N/A  . Number of Children: N/A  . Years of Education: N/A   Occupational History  . works at [employer] Topics  . Smoking status: Never Smoker   . Smokeless tobacco: Not on file  . Alcohol Use: No  . Drug Use: No  . Sexual Activity: Not on file   Other Topics Concern  . Not on file   Social History Narrative  . No narrative on file     Review of Systems: Gen: no fevers, chills, weight gain due to fluid retention HEENT: no complaints CV: shortness of breath with exertion Resp: no cough or sputum GI:no GI bleed,no hemoptysis or blood in stool GU : history of uterine polyp 2015 MS: no acute complaints Derm:  no acute complaints Psych:no acute complaints Heme: no c/o Neuro: no complaints Endocrineo complaints  Vital Signs: Blood pressure 159/93, pulse 113, temperature 98.2 F  (36.8 C), temperature source Oral, resp. rate 24, height 5\' 6"  (1.676 m), weight 145.242 kg (320 lb 3.2 oz), last menstrual period 01/18/2016, SpO2 100 %.   Intake/Output Summary (Last 24 hours) at 02/11/16 1417 Last data filed at 02/11/16 1407  Gross per 24 hour  Intake    100 ml  Output   2500 ml  Net  -2400 ml    Weight trends: Filed Weights   02/11/16 0135 02/11/16 1033  Weight: 136.079 kg (300 lb) 145.242 kg (320 lb 3.2 oz)    Physical Exam: General:  obese lady, laying in the bed  HEENT Ale conjunctiva, anicteric, moist oral mucous membranes  Neck:  supple  Lungs: Clear to auscultation, normal respiratory effort  Heart::  S3 gallop, regular, no rub  Abdomen: Soft, nontender  Extremities:  + pitting edema over lower abdomen and legs up to thighs  Neurologic: Alert, oriented  Skin: No acute rashes  Access:   Foley:        Lab results: Basic Metabolic Panel:  Recent Labs Lab 02/11/16 0155  NA 138  K 3.7  CL 110  CO2 20*  GLUCOSE 116*  BUN 39*  CREATININE 3.08*  CALCIUM 8.3*    Liver Function Tests:  Recent Labs Lab 02/11/16 0155  AST 19  ALT 13*  ALKPHOS 67  BILITOT 0.9  PROT 6.8  ALBUMIN 3.1*   No results for input(s): LIPASE, AMYLASE in the last 168 hours. No results for input(s): AMMONIA in the last 168 hours.  CBC:  Recent Labs Lab 02/11/16 0155  WBC 12.9*  HGB 7.1*  HCT 26.1*  MCV 59.4*  PLT 292    Cardiac Enzymes:  Recent Labs Lab 02/11/16 0722  TROPONINI 0.03    BNP: Invalid input(s): POCBNP  CBG: No results for input(s): GLUCAP in the last 168 hours.  Microbiology: No results found for this or any previous visit (from the past 720 hour(s)).   Coagulation Studies: No results for input(s): LABPROT, INR in the last 72 hours.  Urinalysis:  Recent Labs  02/11/16 0200  COLORURINE YELLOW*  LABSPEC 1.011  PHURINE 5.0  GLUCOSEU 50*  HGBUR NEGATIVE  BILIRUBINUR NEGATIVE  KETONESUR NEGATIVE  PROTEINUR >500*   NITRITE NEGATIVE  LEUKOCYTESUR NEGATIVE        Imaging: Dg Chest  2 View  02/11/2016  CLINICAL DATA:  LEFT leg swelling and drainage for 1 week. Diagnosed with cellulitis. History of hypertension. EXAM: CHEST  2 VIEW COMPARISON:  None available for comparison at time of study interpretation. FINDINGS: The cardiac silhouette is moderately enlarged, mediastinal silhouette is nonsuspicious. Pulmonary vascular congestion without pleural effusion or focal consolidation. No pneumothorax. Large body habitus. Osseous structures are nonsuspicious. IMPRESSION: Moderate cardiomegaly and pulmonary vascular congestion. Electronically Signed   By: Elon Alas M.D.   On: 02/11/2016 02:33      Assessment & Plan: Pt is a 30 y.o. yo female with a PMHX of morbid obesity, dysfunctional uterine bleeding, chronic kidney disease stage III in 2015, was admitted on 02/11/2016 with worsening lower extremity edema.   1.  Acute renal failure on chronic kidney disease stage III versus progression of kidney disease leading to stage IV CKD 2.  Proteinuria 3.  Anasarca 4.  Severe microcytic anemia 5.  Hypoalbuminemia 6.  Severe Hypertension  Plan: Workup for nephrotic syndrome Serologies Iron studies to workup for micocytic anemia Renal ultrasound  We'll follow with you

## 2016-02-11 NOTE — ED Provider Notes (Signed)
[facility] Emergency Department Provider Note  ____________________________________________  Time seen: Approximately 3:00 AM  I have reviewed the triage vital signs and the nursing notes.   HISTORY  Chief Complaint Leg Swelling    HPI ALIZAE HERVEY is a 30 y.o. female with history of morbid obesity and hypertension who presents with a constellation of symptoms but mostly for swelling and bilateral lower extremities, left worse than right, with some "leaking" from the left lateral lower leg.  She also reports gradual onset of worsening shortness of breath with exertion and when she lies flat.  This is been going on for several weeks.  She denies chest pain, fever/chills, cough, abdominal pain, nausea, vomiting, dysuria.Nothing makes her symptoms better and they are getting gradually worse over time.  She currently does not have a primary care doctor.  Her only blood pressure medicine was HCTZ 25 mg daily and she did not take it for an extended period of time because she recently moved and lost her meds.  She did find them recently, however, and has been taking them for at least a couple of days.   Past Medical History  Diagnosis Date  . Hypertension     There are no active problems to display for this patient.   History reviewed. No pertinent past surgical history.  Current Outpatient Rx  Name  Route  Sig  Dispense  Refill  . hydrochlorothiazide (HYDRODIURIL) 25 MG tablet   Oral   Take 25 mg by mouth daily.           Allergies Review of patient's allergies indicates no known allergies.  No family history on file.  Social History Social History  Substance Use Topics  . Smoking status: Never Smoker   . Smokeless tobacco: None  . Alcohol Use: No    Review of Systems Constitutional: No fever/chills Eyes: No visual changes. ENT: No sore throat. Cardiovascular: Denies chest pain. Respiratory: Shortness of breath with exertion and  lying flat. Gastrointestinal: No abdominal pain.  No nausea, no vomiting.  No diarrhea.  No constipation. Genitourinary: Negative for dysuria. Musculoskeletal: Negative for back pain.  Pitting edema in bilateral lower extremities, worse on the left, and with some weeping from her left lower leg. Skin: Negative for rash. Neurological: Negative for headaches, focal weakness or numbness.  10-point ROS otherwise negative.  ____________________________________________   PHYSICAL EXAM:  VITAL SIGNS: ED Triage Vitals  Enc Vitals Group     BP 02/11/16 0135 216/113 mmHg     Pulse Rate 02/11/16 0135 110     Resp 02/11/16 0135 20     Temp 02/11/16 0135 98.2 F (36.8 C)     Temp Source 02/11/16 0135 Oral     SpO2 02/11/16 0135 98 %     Weight 02/11/16 0135 300 lb (136.079 kg)     Height 02/11/16 0135 5\' 6"  (1.676 m)     Head Cir --      Peak Flow --      Pain Score --      Pain Loc --      Pain Edu? --      Excl. in [place]? --     Constitutional: Alert and oriented. Well appearing and in no acute distress though she has an increased respiratory rate. Eyes: Conjunctivae are normal. PERRL. EOMI. Head: Atraumatic. Nose: No congestion/rhinnorhea. Mouth/Throat: Mucous membranes are moist.  Oropharynx non-erythematous. Neck: No stridor.  No meningeal signs.   Cardiovascular: Normal rate, regular rhythm.  Good peripheral circulation. Grossly normal heart sounds.   Respiratory: Normal respiratory effort.  No retractions. Lungs CTAB. Gastrointestinal: Super morbid obesity. Soft and nontender. No distention.  Musculoskeletal: 2+ pitting edema of bilateral lower extremities.  The left is worse than the right.  There is no evidence of cellulitis although there are chronic skin thickening and discoloration of both legs.  There is no active weeping at this time. Neurologic:  Normal speech and language. No gross focal neurologic deficits are appreciated.  Skin:  Skin is warm, dry and intact. No rash  noted. Psychiatric: Mood and affect are normal. Speech and behavior are normal.  ____________________________________________   LABS (all labs ordered are listed, but only abnormal results are displayed)  Labs Reviewed  BASIC METABOLIC PANEL - Abnormal; Notable for the following:    CO2 20 (*)    Glucose, Bld 116 (*)    BUN 39 (*)    Creatinine, Ser 3.08 (*)    Calcium 8.3 (*)    GFR calc non Af Amer 19 (*)    GFR calc Af Amer 22 (*)    All other components within normal limits  CBC - Abnormal; Notable for the following:    WBC 12.9 (*)    Hemoglobin 7.1 (*)    HCT 26.1 (*)    MCV 59.4 (*)    MCH 16.2 (*)    MCHC 27.3 (*)    RDW 21.4 (*)    All other components within normal limits  BRAIN NATRIURETIC PEPTIDE - Abnormal; Notable for the following:    B Natriuretic Peptide 469.0 (*)    All other components within normal limits  HEPATIC FUNCTION PANEL - Abnormal; Notable for the following:    Albumin 3.1 (*)    ALT 13 (*)    All other components within normal limits  URINALYSIS COMPLETEWITH MICROSCOPIC ([facility] ONLY) - Abnormal; Notable for the following:    Color, Urine YELLOW (*)    APPearance CLEAR (*)    Glucose, UA 50 (*)    Protein, ur >500 (*)    Squamous Epithelial / LPF 0-5 (*)    All other components within normal limits  TROPONIN I  POC URINE PREG, ED  POCT PREGNANCY, URINE   ____________________________________________  EKG  ED ECG REPORT I, Dhilan Brauer, the attending physician, personally viewed and interpreted this ECG.  Date: 02/11/2016 EKG Time: 02:00 Rate: 114 Rhythm: Sinus tachycardia QRS Axis: normal Intervals: normal ST/T Wave abnormalities: Non-specific ST segment / T-wave changes, but no evidence of acute ischemia. Conduction Disturbances: none Narrative Interpretation: unremarkable  ____________________________________________  RADIOLOGY   Dg Chest 2 View  02/11/2016  CLINICAL DATA:  LEFT leg swelling and drainage for 1 week.  Diagnosed with cellulitis. History of hypertension. EXAM: CHEST  2 VIEW COMPARISON:  None available for comparison at time of study interpretation. FINDINGS: The cardiac silhouette is moderately enlarged, mediastinal silhouette is nonsuspicious. Pulmonary vascular congestion without pleural effusion or focal consolidation. No pneumothorax. Large body habitus. Osseous structures are nonsuspicious. IMPRESSION: Moderate cardiomegaly and pulmonary vascular congestion. Electronically Signed   By: Elon Alas M.D.   On: 02/11/2016 02:33    ____________________________________________   PROCEDURES  Procedure(s) performed: None  Critical Care performed: No ____________________________________________   INITIAL IMPRESSION / ASSESSMENT AND PLAN / ED COURSE  Pertinent labs & imaging results that were available during my care of the patient were reviewed by me and considered in my medical decision making (see chart for details).  3:13 AM:  The patient has signs and symptoms consistent with congestive heart failure with exacerbation at this time.  Her body habitus is too large to use the new CHF vest in the emergency department.  I am giving her dose of Lasix 20 mg IV.  I believe that she needs treatment for her hypertension and CHF, and her vital signs are abnormal (tachycardic, tachypnea, and excessive hypertension).  I explained to her the reasons I thought she should come into the hospital but at this point she is refusing.  I am still waiting for the rest of her lab work to come back but I will reassess and rediscuss with her when all the lab results have returned.  I will still give her a dose of the Lasix to help begin the diuresis process.  ----------------------------------------- 3:51 AM on 02/11/2016 -----------------------------------------  The patient's labs are concerning for acute on chronic renal failure, and I am concerned about the multisystem organ involvement given the  congestive heart failure exacerbation (without a known diagnosis of CHF).  I had an extensive conversation with the patient about just how ill she is in her need for hospitalization.  She initially refuses multiple times stating that there is too much going on in her life but I expressed this severe morbidity and mortality if she does not seek immediate treatment and inpatient hospitalization.  Her nurse, April, also spoke with her and eventually she agreed to hospitalization.  I spoke With the hospitalist who will admit.  I treated her with the Lasix 20 mg IV as well as with nitroglycerin 1 inch paste on her chest for both her hypertensive urgency and her volume overload.  She needs IV fluids for her renal failure but given the volume overload I am deferring the fluids to the hospitalist to manage the balance between input and output. ____________________________________________  FINAL CLINICAL IMPRESSION(S) / ED DIAGNOSES  Final diagnoses:  Acute congestive heart failure, unspecified congestive heart failure type ([place])  Acute renal failure, unspecified acute renal failure type ([place])  Peripheral edema  Sinus tachycardia ([facility])  Tachypnea  Hypertensive urgency      NEW MEDICATIONS STARTED DURING THIS VISIT:  New Prescriptions   No medications on file      Note:  This document was prepared using Dragon voice recognition software and may include unintentional dictation errors.   Hinda Kehr, MD 02/11/16 [phone]

## 2016-02-11 NOTE — ED Notes (Signed)
Dr. Karma Greaser aware of htn, in to reassess and speak with pt regarding admission.

## 2016-02-11 NOTE — Progress Notes (Signed)
Patient admitted early this morning with with sob, noted to have acute CHF, worsening renal failure and anemia  1. Acute CHF type unknown continue Lasix follow echo 2. Anemia due to iron deficiency last hemoglobin in March 2015 was normal 3. acute renal failure on chronic renal failure could be related to fluid overload I will ask nephrology to see order renal ultrasound 4. Miscellaneous heparin for DVT prophylaxis

## 2016-02-11 NOTE — ED Notes (Signed)
Spoke with dr. reddy regarding pt's continued htn value currently 175/112. Order for additional 10mg  hydralazine iv received.

## 2016-02-11 NOTE — H&P (Signed)
[place] [facility] [place] NAME: [name]    MR#:  [id number]  DATE OF BIRTH:  July 29, 1986  DATE OF ADMISSION:  02/11/2016  PRIMARY CARE PHYSICIAN: No PCP Per Patient   REQUESTING/REFERRING PHYSICIAN:   CHIEF COMPLAINT:   Chief Complaint  Patient presents with  . Leg Swelling    HISTORY OF PRESENT ILLNESS: [name]  is a 30 y.o. female with a known history of Hypertension, bronchial asthma, chronic kidney disease presented to the emergency room because she had swelling in both the legs. Patient says she has been retaining fluid and swelling in the legs for more than a week. She does not have any primary care physician. She also has a shortness of breath and which has been worsening for the last few days. No history of chest pain. Has history of orthopnea. Patient does not have any history of heart failure. She was evaluated in the emergency room and chest x-ray showed vascular congestion and first set of troponin was negative. Hospitalist service was consulted for further care. Patient also has elevated creatinine when compared to the old lab. No history of fever or chills or cough. No history of recent travel or sick contacts at home.  PAST MEDICAL HISTORY:   Past Medical History  Diagnosis Date  . Hypertension   . Asthma   . CKD (chronic kidney disease) stage 2, GFR 60-89 ml/min     PAST SURGICAL HISTORY: Past Surgical History  Procedure Laterality Date  . Tonsillectomy      SOCIAL HISTORY:  Social History  Substance Use Topics  . Smoking status: Never Smoker   . Smokeless tobacco: Not on file  . Alcohol Use: No    FAMILY HISTORY:  Family History  Problem Relation Age of Onset  . Hypertension Mother   . Multiple sclerosis Mother     DRUG ALLERGIES: No Known Allergies  REVIEW OF SYSTEMS:   CONSTITUTIONAL: No fever, fatigue or weakness.  EYES: No blurred or double vision.  EARS, NOSE, AND THROAT: No  tinnitus or ear pain.  RESPIRATORY: No cough, has shortness of breath, no wheezing or hemoptysis.  CARDIOVASCULAR: No chest pain, has orthopnea,has edema.  GASTROINTESTINAL: No nausea, vomiting, diarrhea or abdominal pain.  GENITOURINARY: No dysuria, hematuria.  ENDOCRINE: No polyuria, nocturia,  HEMATOLOGY: No anemia, easy bruising or bleeding SKIN: No rash or lesion. MUSCULOSKELETAL: No joint pain or arthritis.swelling in both legs noted.   NEUROLOGIC: No tingling, numbness, weakness.  PSYCHIATRY: No anxiety or depression.   MEDICATIONS AT HOME:  Prior to Admission medications   Medication Sig Start Date End Date Taking? Authorizing Provider  hydrochlorothiazide (HYDRODIURIL) 25 MG tablet Take 25 mg by mouth daily.   Yes Historical Provider, MD      PHYSICAL EXAMINATION:   VITAL SIGNS: Blood pressure 179/124, pulse 113, temperature 98.2 F (36.8 C), temperature source Oral, resp. rate 35, height 5\' 6"  (1.676 m), weight 136.079 kg (300 lb), last menstrual period 01/18/2016, SpO2 100 %.  GENERAL:  30 y.o.-year-old patient lying in the bed with no acute distress.  EYES: Pupils equal, round, reactive to light and accommodation. No scleral icterus. Extraocular muscles intact.  HEENT: Head atraumatic, normocephalic. Oropharynx and nasopharynx clear.  NECK:  Supple, no jugular venous distention. No thyroid enlargement, no tenderness.  LUNGS: Decreased breath sounds bilaterally, no wheezing, bilateral basal crepitations heard. No use of accessory muscles of respiration.  CARDIOVASCULAR: S1, S2 normal. No murmurs, rubs, or gallops.  ABDOMEN: Soft, nontender, nondistended. Bowel sounds present. No organomegaly or mass.  EXTREMITIES: No pedal edema, cyanosis, or clubbing.Bilateral lower extremity edema noted.  NEUROLOGIC: Cranial nerves II through XII are intact. Muscle strength 5/5 in all extremities. Sensation intact. Gait normal. PSYCHIATRIC: The patient is alert and oriented x 3.  SKIN:  No obvious rash, lesion, or ulcer.   LABORATORY PANEL:   CBC  Recent Labs Lab 02/11/16 0155  WBC 12.9*  HGB 7.1*  HCT 26.1*  PLT 292  MCV 59.4*  MCH 16.2*  MCHC 27.3*  RDW 21.4*   ------------------------------------------------------------------------------------------------------------------  Chemistries   Recent Labs Lab 02/11/16 0155  NA 138  K 3.7  CL 110  CO2 20*  GLUCOSE 116*  BUN 39*  CREATININE 3.08*  CALCIUM 8.3*  AST 19  ALT 13*  ALKPHOS 67  BILITOT 0.9   ------------------------------------------------------------------------------------------------------------------ estimated creatinine clearance is 37.9 mL/min (by C-G formula based on Cr of 3.08). ------------------------------------------------------------------------------------------------------------------ No results for input(s): TSH, T4TOTAL, T3FREE, THYROIDAB in the last 72 hours.  Invalid input(s): FREET3   Coagulation profile No results for input(s): INR, PROTIME in the last 168 hours. ------------------------------------------------------------------------------------------------------------------- No results for input(s): DDIMER in the last 72 hours. -------------------------------------------------------------------------------------------------------------------  Cardiac Enzymes  Recent Labs Lab 02/11/16 0155  TROPONINI 0.03   ------------------------------------------------------------------------------------------------------------------ Invalid input(s): POCBNP  ---------------------------------------------------------------------------------------------------------------  Urinalysis    Component Value Date/Time   COLORURINE YELLOW* 02/11/2016 0200   APPEARANCEUR CLEAR* 02/11/2016 0200   LABSPEC 1.011 02/11/2016 0200   PHURINE 5.0 02/11/2016 0200   GLUCOSEU 50* 02/11/2016 0200   HGBUR NEGATIVE 02/11/2016 0200   BILIRUBINUR NEGATIVE 02/11/2016 0200   KETONESUR  NEGATIVE 02/11/2016 0200   PROTEINUR >500* 02/11/2016 0200   NITRITE NEGATIVE 02/11/2016 0200   LEUKOCYTESUR NEGATIVE 02/11/2016 0200     RADIOLOGY: Dg Chest 2 View  02/11/2016  CLINICAL DATA:  LEFT leg swelling and drainage for 1 week. Diagnosed with cellulitis. History of hypertension. EXAM: CHEST  2 VIEW COMPARISON:  None available for comparison at time of study interpretation. FINDINGS: The cardiac silhouette is moderately enlarged, mediastinal silhouette is nonsuspicious. Pulmonary vascular congestion without pleural effusion or focal consolidation. No pneumothorax. Large body habitus. Osseous structures are nonsuspicious. IMPRESSION: Moderate cardiomegaly and pulmonary vascular congestion. Electronically Signed   By: Elon Alas M.D.   On: 02/11/2016 02:33    EKG: Orders placed or performed during the hospital encounter of 02/11/16  . ED EKG within 10 minutes  . ED EKG within 10 minutes  . EKG 12-Lead  . EKG 12-Lead    IMPRESSION AND PLAN: 30 year old obese female patient with history of hypertension, bronchial asthma, chronic kidney disease presented to the emergency room with the difficulty breathing and swelling in both lower extremities. Admitting diagnosis 1. Fluid overload 2. New onset congestive heart failure 3. Uncontrolled hypertension 4. Acute on chronic renal failure 5. Anemia which could be secondary to kidney disease Treatment plan 1. Admit patient to telemetry 2 diurese patient with IV Lasix 40 mg every 12 hourly 3. DVT prophylaxis subcutaneous heparin 4. Control blood pressure with oral beta blocker, ACE inhibitor and when necessary IV hydralazine 5 check echocardiogram 6. Cardiology consultation 7. Monitor hemoglobin and hematocrit.  All the records are reviewed and case discussed with ED provider. Management plans discussed with the patient, family and they are in agreement.  CODE STATUS:FULL Code Status History    This patient does not have a  recorded code status. Please follow your organizational policy for patients in this situation.  TOTAL TIME TAKING CARE OF THIS PATIENT:52 minutes.    Saundra Shelling M.D on 02/11/2016 at 4:56 AM  Between 7am to 6pm - Pager - [phone]  After 6pm go to [email or web address] - password [email or web address] [facility]  [facility]  Office  [phone]  CC: Primary care physician; No PCP Per Patient

## 2016-02-11 NOTE — ED Notes (Signed)
Pt assisted up to restroom

## 2016-02-11 NOTE — ED Notes (Signed)
.  Patient ambulatory to triage with steady gait, without difficulty or distress noted; pt reports swelling to left leg left eye tonight; st hx of same with cellulitis

## 2016-02-11 NOTE — Progress Notes (Deleted)
Called from [place] stating that pts o2 sats in 40s/ RN to bedside to assess/ pt o2 sats in the 70s/ fingernail bed bluish/ placed on NRB and resp called to room/ BP 105/57/ continues to be bradycardic/ HR 40-50s/ pt easily arousalible/ speaking to nurse and resp / no distress/ ABG ordered and EKG./ pt is a DNR/  MD aware and at bedside/ MD speaking with family/ waiting on results of ABG to determine if bipap is needed/ staff at pts bedside/ will continue to monitor

## 2016-02-11 NOTE — Consult Note (Signed)
Cardiology Consultation Note  Patient ID: ROOSEVELT GORDNER, MRN: [id number], DOB/AGE: Nov 18, 1985 30 y.o. Admit date: 02/11/2016   Date of Consult: 02/11/2016 Primary Physician: No PCP Per Patient Primary Cardiologist: New to [facility]  Chief Complaint: LE swelling and weeping for 2-3 years Reason for Consult: Acute on chronic diastolic CHF   HPI: 30 y.o. female with h/o CKD stage III, uncontrolled HTN, morbid obesity, and asthma who has not seen an MD in teh outpatient setting in many years presented to [facility] on 4/26 with LE swelling for the past 2-3 years. Cardiology is consulted for CHF exacerbation int he setting of hypoalbuminemia and acute on chronic anemia with hgb of 7.1.   She has no previously known cardiac history. Patient has chronic LE edma for the past 2-3 years. Her current edema is actually improved from her baseline, though she is still with significant edema. She presented to [facility] on 4/26 given weeping from the LLE. No SOB, chest pain, palpitations, N/V/D, or diaphoresis. She was originally diagnosed with HTN 2-3 years ago and has been prescribed HCTZ only, though does not take this medication. She does not have a PCP. She does not check her BP at home. She has stable 2-pillow orthopnea for years. No early satiety. She denies any BRBPR, melena, hematuria, or hematemesis. Her periods are fairly heavy and last 5-7 days.  Upon the patient's arrival to [facility] they were found to have negative troponin x 2, BNP 469, >500 protein in urine, albumin 3.1, SCr 2.12-->3.08, admission BP of 216/113-->169/98. ECG showed sinus tachycardia, 114 bpm, low voltage precordial leads, borderline IVCD, nonspecific st/t changes, CXR showed cardiomegaly with vascular congestion. She received IV Lasix 20 mg x 1 with bump in renal function as above and no documented UOP.    Past Medical History  Diagnosis Date  . Hypertension   . Asthma   . CKD (chronic kidney disease) stage 2, GFR 60-89 ml/min       Most  Recent Cardiac Studies: none   Surgical History:  Past Surgical History  Procedure Laterality Date  . Tonsillectomy       Home Meds: Prior to Admission medications   Medication Sig Start Date End Date Taking? Authorizing Provider  hydrochlorothiazide (HYDRODIURIL) 25 MG tablet Take 25 mg by mouth daily.   Yes Historical Provider, MD    Inpatient Medications:  . aspirin EC  81 mg Oral Daily  . carvedilol  6.25 mg Oral BID WC  . furosemide  40 mg Intravenous Q12H  . heparin  5,000 Units Subcutaneous Q8H  . ramipril  2.5 mg Oral Daily  . sodium chloride flush  3 mL Intravenous Q12H      Allergies: No Known Allergies  Social History   Social History  . Marital Status: Single    Spouse Name: N/A  . Number of Children: N/A  . Years of Education: N/A   Occupational History  . works at [employer] Topics  . Smoking status: Never Smoker   . Smokeless tobacco: Not on file  . Alcohol Use: No  . Drug Use: No  . Sexual Activity: Not on file   Other Topics Concern  . Not on file   Social History Narrative  . No narrative on file     Family History  Problem Relation Age of Onset  . Hypertension Mother   . Multiple sclerosis Mother      Review of Systems: Review of Systems  Constitutional:  Positive for malaise/fatigue. Negative for fever, chills, weight loss and diaphoresis.  HENT: Negative for congestion.   Eyes: Negative for discharge and redness.  Respiratory: Negative for cough, hemoptysis, sputum production, shortness of breath and wheezing.   Cardiovascular: Positive for leg swelling. Negative for chest pain, palpitations, orthopnea, claudication and PND.  Gastrointestinal: Negative for nausea, vomiting, diarrhea, constipation, blood in stool and melena.  Genitourinary: Negative for hematuria.  Musculoskeletal: Negative for myalgias and falls.  Skin: Negative for rash.  Neurological: Positive for weakness. Negative for dizziness,  tingling, tremors, sensory change, speech change, focal weakness and loss of consciousness.  Endo/Heme/Allergies: Does not bruise/bleed easily.  Psychiatric/Behavioral: Negative for substance abuse. The patient is not nervous/anxious.   All other systems reviewed and are negative.   Labs:  Recent Labs  02/11/16 0155 02/11/16 0722  TROPONINI 0.03 0.03   Lab Results  Component Value Date   WBC 12.9* 02/11/2016   HGB 7.1* 02/11/2016   HCT 26.1* 02/11/2016   MCV 59.4* 02/11/2016   PLT 292 02/11/2016    Recent Labs Lab 02/11/16 0155  NA 138  K 3.7  CL 110  CO2 20*  BUN 39*  CREATININE 3.08*  CALCIUM 8.3*  PROT 6.8  BILITOT 0.9  ALKPHOS 67  ALT 13*  AST 19  GLUCOSE 116*   No results found for: CHOL, HDL, LDLCALC, TRIG No results found for: DDIMER  Radiology/Studies:  Dg Chest 2 View  02/11/2016  CLINICAL DATA:  LEFT leg swelling and drainage for 1 week. Diagnosed with cellulitis. History of hypertension. EXAM: CHEST  2 VIEW COMPARISON:  None available for comparison at time of study interpretation. FINDINGS: The cardiac silhouette is moderately enlarged, mediastinal silhouette is nonsuspicious. Pulmonary vascular congestion without pleural effusion or focal consolidation. No pneumothorax. Large body habitus. Osseous structures are nonsuspicious. IMPRESSION: Moderate cardiomegaly and pulmonary vascular congestion. Electronically Signed   By: Elon Alas M.D.   On: 02/11/2016 02:33    EKG: sinus tachycardia, 114 bpm, low voltage precordial leads, borderline IVCD, nonspecific st/t changes  Weights: Filed Weights   02/11/16 0135  Weight: 300 lb (136.079 kg)     Physical Exam: Blood pressure 169/98, pulse 113, temperature 98.2 F (36.8 C), temperature source Oral, resp. rate 20, height 5\' 6"  (1.676 m), weight 300 lb (136.079 kg), last menstrual period 01/18/2016, SpO2 97 %. Body mass index is 48.44 kg/(m^2). General: Well developed, well nourished, in no acute  distress. Head: Normocephalic, atraumatic, sclera non-icteric, no xanthomas, nares are without discharge.  Neck: Negative for carotid bruits. JVD unable to be assessed given patient's body habitus. Lungs: Clear bilaterally to auscultation without wheezes, rales, or rhonchi. Breathing is unlabored. Heart: RRR with S1 S2. No murmurs, rubs, or gallops appreciated. Abdomen: Obese, soft, non-tender, non-distended with normoactive bowel sounds. No hepatomegaly. No rebound/guarding. No obvious abdominal masses. Msk:  Strength and tone appear normal for age. Extremities: No clubbing or cyanosis. 2+ pitting edema to the hips.  Distal pedal pulses are 2+ and equal bilaterally. Neuro: Alert and oriented X 3. No facial asymmetry. No focal deficit. Moves all extremities spontaneously. Psych:  Responds to questions appropriately with a normal affect.    Assessment and Plan:   1. Acute on chronic diastolic CHF: -Echo pending to evaluate LV systolic function -BNP elevated at 469 -No UOP with IV Lasix 20 mg x 1, however there was worsening of renal function with this -Will hold diuresis this morning given worsening renal function -Suspect her LE edema, which is actually  improved from her baseline, is multifactorial including diastolic CHF, hypoalbuminemia, and acute on chronic anemia. -Without addressing and correcting her underlying anemia and protein issues diuresis alone will not resolve her symptoms. These aspects need to be addressed as well.  2. Concern for nephrotic syndrome: -Patient presented with SCr of 2.12 which worsened to 3.08 s/p IV Lasix 20 mg x 1. Patient with >500 mg protein in her UA without hematuria, an albumin of 3.1, and SBP 216. -Cannot rule out that her LE edema and vascular congestion is multifactorial including both acute on chronic diastolic CHF and nephrotic syndrome. -Recommend work up for nephrotic syndrome and nephrology consultation.   3. Malignant HTN/hypertensive  emergency: -As above, she presented with BP 216/113 with evidence of end organ damage. -BP has improved to 99991111 systolic at this time -Continue to titrate antihypertensives -Hold Altace as below given worsening renal function -Add Norvasc and titrate   4. 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 Anemia of chronic disease: -Prior hgb from 2015 was 12.5 -Hgb this admission was 7.1 -Needs evaluation for possible bleed -Recommend Fe, EPO, and transfusion to hgb of 8.5 -Per IM  6. Asthma: -Controlled  7. Morbid obesity: -Weight loss advised   Melvern Banker, PA-C Pager: [phone] 02/11/2016, 9:25 AM

## 2016-02-12 ENCOUNTER — Inpatient Hospital Stay (HOSPITAL_COMMUNITY)
Admit: 2016-02-12 | Discharge: 2016-02-12 | Disposition: A | Discharge status: Home / Self Care | Payer: Managed Care, Other (non HMO) | Attending: Internal Medicine | Admitting: Internal Medicine

## 2016-02-12 DIAGNOSIS — R600 Localized edema: Secondary | ICD-10-CM | POA: Insufficient documentation

## 2016-02-12 DIAGNOSIS — N189 Chronic kidney disease, unspecified: Secondary | ICD-10-CM

## 2016-02-12 DIAGNOSIS — R000 Tachycardia, unspecified: Secondary | ICD-10-CM | POA: Insufficient documentation

## 2016-02-12 DIAGNOSIS — E8770 Fluid overload, unspecified: Secondary | ICD-10-CM

## 2016-02-12 DIAGNOSIS — I160 Hypertensive urgency: Secondary | ICD-10-CM | POA: Insufficient documentation

## 2016-02-12 DIAGNOSIS — R609 Edema, unspecified: Secondary | ICD-10-CM

## 2016-02-12 DIAGNOSIS — N179 Acute kidney failure, unspecified: Secondary | ICD-10-CM

## 2016-02-12 DIAGNOSIS — D509 Iron deficiency anemia, unspecified: Secondary | ICD-10-CM | POA: Insufficient documentation

## 2016-02-12 DIAGNOSIS — I509 Heart failure, unspecified: Secondary | ICD-10-CM | POA: Insufficient documentation

## 2016-02-12 LAB — ECHOCARDIOGRAM COMPLETE
Height: 66 in
WEIGHTICAEL: 4889.6 [oz_av]

## 2016-02-12 LAB — LIPID PANEL
CHOL/HDL RATIO: 3 ratio
Cholesterol: 89 mg/dL (ref 0–200)
HDL: 30 mg/dL — AB (ref >40)
LDL CALC: 46 mg/dL (ref 0–99)
TRIGLYCERIDES: 65 mg/dL (ref <150)
VLDL: 13 mg/dL (ref 0–40)

## 2016-02-12 LAB — PROTIME-INR
INR: 1.16
Prothrombin Time: 15 seconds (ref 11.4–15.0)

## 2016-02-12 LAB — CBC WITH DIFFERENTIAL/PLATELET
BASOS ABS: 0.1 10*3/uL (ref 0–0.1)
Basophils Relative: 1 %
EOS PCT: 1 %
Eosinophils Absolute: 0.1 10*3/uL (ref 0–0.7)
HEMATOCRIT: 27.6 % — AB (ref 35.0–47.0)
Hemoglobin: 7.6 g/dL — ABNORMAL LOW (ref 12.0–16.0)
LYMPHS ABS: 1.3 10*3/uL (ref 1.0–3.6)
Lymphocytes Relative: 9 %
MCH: 16.1 pg — AB (ref 26.0–34.0)
MCHC: 27.5 g/dL — AB (ref 32.0–36.0)
MCV: 58.4 fL — AB (ref 80.0–100.0)
MONO ABS: 0.7 10*3/uL (ref 0.2–0.9)
Monocytes Relative: 5 %
Neutro Abs: 12.3 10*3/uL — ABNORMAL HIGH (ref 1.4–6.5)
Neutrophils Relative %: 84 %
Platelets: 326 10*3/uL (ref 150–440)
RBC: 4.72 MIL/uL (ref 3.80–5.20)
RDW: 21.6 % — AB (ref 11.5–14.5)
WBC: 14.5 10*3/uL — AB (ref 3.6–11.0)

## 2016-02-12 LAB — HEPATITIS B SURFACE ANTIBODY,QUALITATIVE: HEP B S AB: NONREACTIVE

## 2016-02-12 LAB — ANCA TITERS
Atypical P-ANCA titer: <1:20 {titer}
C-ANCA: <1:20 {titer}

## 2016-02-12 LAB — BASIC METABOLIC PANEL
ANION GAP: 12 (ref 5–15)
BUN: 34 mg/dL — ABNORMAL HIGH (ref 6–20)
CALCIUM: 8.8 mg/dL — AB (ref 8.9–10.3)
CHLORIDE: 109 mmol/L (ref 101–111)
CO2: 20 mmol/L — AB (ref 22–32)
Creatinine, Ser: 2.71 mg/dL — ABNORMAL HIGH (ref 0.44–1.00)
GFR calc non Af Amer: 22 mL/min — ABNORMAL LOW (ref >60)
GFR, EST AFRICAN AMERICAN: 26 mL/min — AB (ref >60)
Glucose, Bld: 110 mg/dL — ABNORMAL HIGH (ref 65–99)
POTASSIUM: 3.6 mmol/L (ref 3.5–5.1)
Sodium: 141 mmol/L (ref 135–145)

## 2016-02-12 LAB — HEPATITIS C ANTIBODY (REFLEX)

## 2016-02-12 LAB — HEPATITIS B SURFACE ANTIGEN: Hepatitis B Surface Ag: NEGATIVE

## 2016-02-12 LAB — HCV COMMENT:

## 2016-02-12 LAB — C3 COMPLEMENT: C3 Complement: 141 mg/dL (ref 82–167)

## 2016-02-12 LAB — APTT: aPTT: 28 seconds (ref 24–36)

## 2016-02-12 LAB — MPO/PR-3 (ANCA) ANTIBODIES
ANCA Proteinase 3: <3.5 U/mL (ref 0.0–3.5)
Myeloperoxidase Abs: <9 U/mL (ref 0.0–9.0)

## 2016-02-12 LAB — HIV ANTIBODY (ROUTINE TESTING W REFLEX): HIV SCREEN 4TH GENERATION: NONREACTIVE

## 2016-02-12 LAB — HEPARIN LEVEL (UNFRACTIONATED)
Heparin Unfractionated: 0.2 IU/mL — ABNORMAL LOW (ref 0.30–0.70)
Heparin Unfractionated: <0.1 IU/mL — ABNORMAL LOW (ref 0.30–0.70)

## 2016-02-12 LAB — C4 COMPLEMENT: COMPLEMENT C4, BODY FLUID: 24 mg/dL (ref 14–44)

## 2016-02-12 LAB — TROPONIN I: Troponin I: 0.22 ng/mL — ABNORMAL HIGH (ref <0.031)

## 2016-02-12 LAB — OCCULT BLOOD X 1 CARD TO LAB, STOOL: FECAL OCCULT BLD: NEGATIVE

## 2016-02-12 LAB — PARATHYROID HORMONE, INTACT (NO CA): PTH: 217 pg/mL — ABNORMAL HIGH (ref 15–65)

## 2016-02-12 MED ORDER — CARVEDILOL 12.5 MG PO TABS
12.5000 mg | ORAL_TABLET | Freq: Once | ORAL | Status: AC
  Administered 2016-02-12: 12.5 mg via ORAL
  Filled 2016-02-12: qty 1

## 2016-02-12 MED ORDER — SODIUM CHLORIDE 0.9 % IV SOLN
500.0000 mg | Freq: Once | INTRAVENOUS | Status: DC
  Filled 2016-02-12: qty 25

## 2016-02-12 MED ORDER — SODIUM CHLORIDE 0.9 % IV SOLN
510.0000 mg | Freq: Once | INTRAVENOUS | Status: AC
  Administered 2016-02-12: 510 mg via INTRAVENOUS
  Filled 2016-02-12: qty 17

## 2016-02-12 MED ORDER — CARVEDILOL 12.5 MG PO TABS
25.0000 mg | ORAL_TABLET | Freq: Two times a day (BID) | ORAL | Status: DC
  Administered 2016-02-12 – 2016-02-16 (×8): 25 mg via ORAL
  Filled 2016-02-12 (×8): qty 2

## 2016-02-12 MED ORDER — FUROSEMIDE 10 MG/ML IJ SOLN
40.0000 mg | Freq: Once | INTRAMUSCULAR | Status: AC
  Administered 2016-02-12: 40 mg via INTRAVENOUS
  Filled 2016-02-12: qty 4

## 2016-02-12 MED ORDER — HEPARIN (PORCINE) IN NACL 100-0.45 UNIT/ML-% IJ SOLN
1650.0000 [IU]/h | INTRAMUSCULAR | Status: DC
  Administered 2016-02-12: 1200 [IU]/h via INTRAVENOUS
  Administered 2016-02-13: 1650 [IU]/h via INTRAVENOUS
  Filled 2016-02-12 (×3): qty 250

## 2016-02-12 MED ORDER — CLONIDINE HCL 0.1 MG PO TABS
0.1000 mg | ORAL_TABLET | Freq: Three times a day (TID) | ORAL | Status: DC
  Administered 2016-02-12 – 2016-02-13 (×5): 0.1 mg via ORAL
  Filled 2016-02-12 (×5): qty 1

## 2016-02-12 MED ORDER — HEPARIN BOLUS VIA INFUSION
2800.0000 [IU] | Freq: Once | INTRAVENOUS | Status: AC
  Administered 2016-02-12: 2800 [IU] via INTRAVENOUS
  Filled 2016-02-12: qty 2800

## 2016-02-12 MED ORDER — ISOSORBIDE MONONITRATE ER 30 MG PO TB24
30.0000 mg | ORAL_TABLET | Freq: Two times a day (BID) | ORAL | Status: DC
  Administered 2016-02-12 – 2016-02-16 (×9): 30 mg via ORAL
  Filled 2016-02-12 (×9): qty 1

## 2016-02-12 NOTE — Progress Notes (Signed)
Subjective:  Serum creatinine slightly improved to 2.71.urine output 4750 cc. Lower Extremity edema, appears to be improved.  Also noted to have severe iron deficiency. Urine protein/creatinine ratio is 4.0 suggesting nephrotic range proteinuria   Objective:  Vital signs in last 24 hours:  Temp:  [98 F (36.7 C)-98.6 F (37 C)] 98.5 F (36.9 C) (04/28 1122) Pulse Rate:  [100-114] 100 (04/28 1122) Resp:  [16-20] 20 (04/28 1122) BP: (151-172)/(93-103) 151/93 mmHg (04/28 1122) SpO2:  [98 %-100 %] 100 % (04/28 1122) Weight:  [138.619 kg (305 lb 9.6 oz)] 138.619 kg (305 lb 9.6 oz) (04/28 0602)  Weight change: 9.163 kg (20 lb 3.2 oz) Filed Weights   02/11/16 0135 02/11/16 1033 02/12/16 0602  Weight: 136.079 kg (300 lb) 145.242 kg (320 lb 3.2 oz) 138.619 kg (305 lb 9.6 oz)    Intake/Output:    Intake/Output Summary (Last 24 hours) at 02/12/16 1409 Last data filed at 02/12/16 0830  Gross per 24 hour  Intake    240 ml  Output   2250 ml  Net  -2010 ml     Physical Exam: General: No acute distress, sitting up in the bed  HEENT Pale conjunctiva, moist oral mucous membranes  Neck supple  Pulm/lungs normal effort,clear to auscultation bilaterally  CVS/Heart Regular, tachycardic, S3 gallop  Abdomen:  Soft, nontender, nondistended  Extremities: 2+ pitting edema bilaterally  Neurologic: Alert, oriented  Skin: No acute rashes          Basic Metabolic Panel:   Recent Labs Lab 02/11/16 0155 02/12/16 0449  NA 138 141  K 3.7 3.6  CL 110 109  CO2 20* 20*  GLUCOSE 116* 110*  BUN 39* 34*  CREATININE 3.08* 2.71*  CALCIUM 8.3* 8.8*     CBC:  Recent Labs Lab 02/11/16 0155 02/12/16 0449  WBC 12.9* 14.5*  NEUTROABS  --  12.3*  HGB 7.1* 7.6*  HCT 26.1* 27.6*  MCV 59.4* 58.4*  PLT 292 326      Microbiology:  No results found for this or any previous visit (from the past 720 hour(s)).  Coagulation Studies:  Recent Labs  02/12/16 0708  LABPROT 15.0  INR  1.16    Urinalysis:  Recent Labs  02/11/16 0200  COLORURINE YELLOW*  LABSPEC 1.011  PHURINE 5.0  GLUCOSEU 50*  HGBUR NEGATIVE  BILIRUBINUR NEGATIVE  KETONESUR NEGATIVE  PROTEINUR >500*  NITRITE NEGATIVE  LEUKOCYTESUR NEGATIVE      Imaging: Dg Chest 2 View  02/11/2016  CLINICAL DATA:  LEFT leg swelling and drainage for 1 week. Diagnosed with cellulitis. History of hypertension. EXAM: CHEST  2 VIEW COMPARISON:  None available for comparison at time of study interpretation. FINDINGS: The cardiac silhouette is moderately enlarged, mediastinal silhouette is nonsuspicious. Pulmonary vascular congestion without pleural effusion or focal consolidation. No pneumothorax. Large body habitus. Osseous structures are nonsuspicious. IMPRESSION: Moderate cardiomegaly and pulmonary vascular congestion. Electronically Signed   By: Elon Alas M.D.   On: 02/11/2016 02:33   US Renal  02/11/2016  CLINICAL DATA:  Acute renal failure EXAM: RENAL / URINARY TRACT ULTRASOUND COMPLETE COMPARISON:  None in PACs FINDINGS: Right Kidney: Length: 12.1 cm. The renal cortical echotexture is increased and greater than that of the adjacent liver. There is no hydronephrosis nor discrete mass. Left Kidney: Length: 10.6 cm. The cortical echotexture is increased. There is no hydronephrosis. There is no discrete mass. Bladder: The partially distended urinary bladder is normal in appearance. Bilateral ureteral jets are observed. IMPRESSION: Increased renal  cortical echotexture consistent with medical renal disease. There is no evidence of obstruction. Electronically Signed   By: David  Martinique M.D.   On: 02/11/2016 15:46     Medications:   . heparin 1,200 Units/hr (02/12/16 0819)   . aspirin EC  81 mg Oral Daily  . carvedilol  25 mg Oral BID WC  . cloNIDine  0.1 mg Oral TID  . ferumoxytol  510 mg Intravenous Once  . isosorbide mononitrate  30 mg Oral BID  . sodium chloride flush  3 mL Intravenous Q12H   sodium  chloride, acetaminophen, hydrALAZINE, ondansetron (ZOFRAN) IV, oxyCODONE-acetaminophen, sodium chloride flush  Assessment/ Plan:  30 y.o. female with morbid obesity, malignant hypertension, asthma, chronic kidney disease  1.  Acute renal failure on chronic kidney disease stage III.  Baseline creatinine unknown but it was 2.12 in March 2015 with GFR of 36.  Patient also has nephrotic range proteinuria. Cholesterol is not elevated.  Hemoglobin A1c is 5.9%. Differential diagnosis includes FSGS-primary versus secondary due to morbid obesity Patient might end up needing a kidney biopsy. if still in the hospital, will arrange for it next week otherwise we can get it done  ANA, ANCA are pending  2. Severe iron deficiency anemia Recommend Feraheme administration  3.  Anasarca 2-D echo is pending Currently getting IV Lasix with good result  4.severe hypertension.  Treated with carvedilol.  Avoid ACE inhibitors/ARBat this time with acute renal failure but we will be able to use it once serum creatinine stabilizes. We will add clonidine to her regimen   LOS: 1 Drako Maese 4/28/20172:09 PM

## 2016-02-12 NOTE — Progress Notes (Addendum)
Patient: [name] / Admit Date: 02/11/2016 / Date of Encounter: 02/12/2016, 8:43 AM   Subjective: Reports edema has improved slightly, still firm edema in both legs Recent fatigue, tachycardia  Review of Systems: Review of Systems  Constitutional: Positive for malaise/fatigue.  Respiratory: Positive for shortness of breath.   Cardiovascular: Positive for palpitations.  Gastrointestinal: Negative.   Musculoskeletal: Negative.   Neurological: Negative.   Psychiatric/Behavioral: Negative.   All other systems reviewed and are negative.   Objective: Telemetry: sinus tachycardia Physical Exam: Blood pressure 170/97, pulse 109, temperature 98 F (36.7 C), temperature source Oral, resp. rate 16, height 5\' 6"  (1.676 m), weight 305 lb 9.6 oz (138.619 kg), last menstrual period 01/18/2016, SpO2 98 %. Body mass index is 49.35 kg/(m^2). General: Well developed, well nourished, in no acute distress. Head: Normocephalic, atraumatic, sclera non-icteric, no xanthomas, nares are without discharge. Neck: Negative for carotid bruits. JVD unable to be assessed given patient's body habitus. Lungs: Clear bilaterally to auscultation without wheezes, rales, or rhonchi. Breathing is unlabored. Heart: RRR with S1 S2. No murmurs, rubs, or gallops appreciated. Abdomen: Obese, soft, non-tender, non-distended with normoactive bowel sounds. No hepatomegaly. No rebound/guarding. No obvious abdominal masses. Msk: Strength and tone appear normal for age. Extremities: No clubbing or cyanosis. 1+ pitting edema to the hips. Distal pedal pulses are 2+ and equal bilaterally. Neuro: Alert and oriented X 3. No facial asymmetry. No focal deficit. Moves all extremities spontaneously. Psych: Responds to questions appropriately with a normal affect.   Intake/Output Summary (Last 24 hours) at 02/12/16 0843 Last data filed at 02/12/16 [id number]  Gross per 24 hour  Intake      0 ml  Output   4750 ml  Net   -4750 ml    Inpatient Medications:  . amLODipine  5 mg Oral Daily  . aspirin EC  81 mg Oral Daily  . carvedilol  6.25 mg Oral BID WC  . sodium chloride flush  3 mL Intravenous Q12H   Infusions:  . heparin 1,200 Units/hr (02/12/16 0819)    Labs:  Recent Labs  02/11/16 0155 02/12/16 0449  NA 138 141  K 3.7 3.6  CL 110 109  CO2 20* 20*  GLUCOSE 116* 110*  BUN 39* 34*  CREATININE 3.08* 2.71*  CALCIUM 8.3* 8.8*    Recent Labs  02/11/16 0155  AST 19  ALT 13*  ALKPHOS 67  BILITOT 0.9  PROT 6.8  ALBUMIN 3.1*    Recent Labs  02/11/16 0155  WBC 12.9*  HGB 7.1*  HCT 26.1*  MCV 59.4*  PLT 292    Recent Labs  02/11/16 0722 02/11/16 1317 02/11/16 1920 02/12/16 0449  TROPONINI 0.03 0.05* 0.20* 0.22*   Invalid input(s): POCBNP  Recent Labs  02/11/16 1317  HGBA1C 5.9     Weights: Filed Weights   02/11/16 0135 02/11/16 1033 02/12/16 0602  Weight: 300 lb (136.079 kg) 320 lb 3.2 oz (145.242 kg) 305 lb 9.6 oz (138.619 kg)     Radiology/Studies:  Dg Chest 2 View  02/11/2016  CLINICAL DATA:  LEFT leg swelling and drainage for 1 week. Diagnosed with cellulitis. History of hypertension. EXAM: CHEST  2 VIEW COMPARISON:  None available for comparison at time of study interpretation. FINDINGS: The cardiac silhouette is moderately enlarged, mediastinal silhouette is nonsuspicious. Pulmonary vascular congestion without pleural effusion or focal consolidation. No pneumothorax. Large body habitus. Osseous structures are nonsuspicious. IMPRESSION: Moderate cardiomegaly and pulmonary vascular congestion. Electronically Signed   By: Sandie Ano  Bloomer M.D.   On: 02/11/2016 02:33   US Renal  02/11/2016  CLINICAL DATA:  Acute renal failure EXAM: RENAL / URINARY TRACT ULTRASOUND COMPLETE COMPARISON:  None in PACs FINDINGS: Right Kidney: Length: 12.1 cm. The renal cortical echotexture is increased and greater than that of the adjacent liver. There is no hydronephrosis nor  discrete mass. Left Kidney: Length: 10.6 cm. The cortical echotexture is increased. There is no hydronephrosis. There is no discrete mass. Bladder: The partially distended urinary bladder is normal in appearance. Bilateral ureteral jets are observed. IMPRESSION: Increased renal cortical echotexture consistent with medical renal disease. There is no evidence of obstruction. Electronically Signed   By: David  Martinique M.D.   On: 02/11/2016 15:46     Assessment and Plan  30 y.o. female   1. Acute on chronic diastolic CHF: -Echo pending  LE edema is multifactorial including diastolic CHF, hypoalbuminemia, and acute on chronic anemia. --Could continue gentle diuresis, For anemia, see below  2. Anemia: Consider Epogen, consider blood transfusion, check iron studies She does report having heavy menstrual cycles 5-7 days per months May need progesterone birth control (would defer to primary care/GYN)  3. Malignant HTN/hypertensive emergency:  presented with BP 216/113  -Increase coreg up to 25 mg po BID Avoid CA channel blockers in setting of leg edema Start isosorbide 30 mg po BID  4. 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 Asthma: -Controlled  6. Morbid obesity: -Weight loss advised  7. Sinus tachycardia Possibly exacerbated by anemia --Will increase coreg up to 25 mg po BID   Total encounter time more than 35 minutes  Greater than 50% was spent in counseling and coordination of care with the patient    Signed, Esmond Plants, MD, Ph.D. [facility] 02/12/2016, 8:43 AM

## 2016-02-12 NOTE — Progress Notes (Signed)
[place] [facility]                                                                                                                                                                                            Patient Demographics   [name], is a 30 y.o. female, DOB - June 15, 1986, [place] date - 02/11/2016   Admitting Physician Saundra Shelling, MD  Outpatient Primary MD for the patient is No PCP Per Patient   LOS - 1  Subjective:Patient feeling better breathing improved renal function improved patient urinated a lot since yesterday denies any chest pain and swelling of the lower extremity improved      Review of Systems:   CONSTITUTIONAL: No documented fever. No fatigue, weakness. No weight gain, no weight loss.  EYES: No blurry or double vision.  ENT: No tinnitus. No postnasal drip. No redness of the oropharynx.  RESPIRATORY: No cough, no wheeze, no hemoptysis.Positive  dyspnea.  CARDIOVASCULAR: No chest pain. No orthopnea. No palpitations. No syncope.  GASTROINTESTINAL: No nausea, no vomiting or diarrhea. No abdominal pain. No melena or hematochezia.  GENITOURINARY: No dysuria or hematuria.  ENDOCRINE: No polyuria or nocturia. No heat or cold intolerance.  HEMATOLOGY: No anemia. No bruising. No bleeding.  INTEGUMENTARY: No rashes. No lesions.  MUSCULOSKELETAL: No arthritis. No swelling. No gout.  NEUROLOGIC: No numbness, tingling, or ataxia. No seizure-type activity.  PSYCHIATRIC: No anxiety. No insomnia. No ADD.    Vitals:   Filed Vitals:   02/11/16 1943 02/11/16 2027 02/12/16 0602 02/12/16 0651  BP: 168/103 163/102 172/99 170/97  Pulse: 104 109 114 109  Temp: 98.6 F (37 C)  98 F (36.7 C)   TempSrc: Oral  Oral   Resp: 16  16   Height:      Weight:   138.619 kg (305 lb 9.6 oz)   SpO2: 100%  98%     Wt Readings from Last 3 Encounters:  02/12/16 138.619 kg (305 lb 9.6 oz)     Intake/Output Summary (Last  24 hours) at 02/12/16 0915 Last data filed at 02/12/16 [id number]  Gross per 24 hour  Intake      0 ml  Output   4750 ml  Net  -4750 ml    Physical Exam:   GENERAL: Pleasant-appearing in no apparent distress.  HEAD, EYES, EARS, NOSE AND THROAT: Atraumatic, normocephalic. Extraocular muscles are intact. Pupils equal and reactive to light. Sclerae anicteric. No conjunctival injection. No oro-pharyngeal erythema.  NECK: Supple. There is no jugular venous distention. No bruits, no lymphadenopathy, no thyromegaly.  HEART: Regular rate and rhythm,.  No murmurs, no rubs, no clicks.  LUNGS: Clear to auscultation bilaterally. No rales or rhonchi. No wheezes.  ABDOMEN: Soft, flat, nontender, nondistended. Has good bowel sounds. No hepatosplenomegaly appreciated.  EXTREMITIES: No evidence of any cyanosis, clubbing, or  +2 pedal and radial pulses bilaterally.  NEUROLOGIC: The patient is alert, awake, and oriented x3 with no focal motor or sensory deficits appreciated bilaterally.  SKIN: Moist and warm with no rashes appreciated.  Psych: Not anxious, depressed LN: No inguinal LN enlargement    Antibiotics   Anti-infectives    None      Medications   Scheduled Meds: . aspirin EC  81 mg Oral Daily  . carvedilol  12.5 mg Oral Once  . carvedilol  25 mg Oral BID WC  . isosorbide mononitrate  30 mg Oral BID  . sodium chloride flush  3 mL Intravenous Q12H   Continuous Infusions: . heparin 1,200 Units/hr (02/12/16 0819)   PRN Meds:.sodium chloride, acetaminophen, hydrALAZINE, ondansetron (ZOFRAN) IV, oxyCODONE-acetaminophen, sodium chloride flush   Data Review:   Micro Results No results found for this or any previous visit (from the past 240 hour(s)).  Radiology Reports Dg Chest 2 View  02/11/2016  CLINICAL DATA:  LEFT leg swelling and drainage for 1 week. Diagnosed with cellulitis. History of hypertension. EXAM: CHEST  2 VIEW COMPARISON:  None available for comparison at time of study  interpretation. FINDINGS: The cardiac silhouette is moderately enlarged, mediastinal silhouette is nonsuspicious. Pulmonary vascular congestion without pleural effusion or focal consolidation. No pneumothorax. Large body habitus. Osseous structures are nonsuspicious. IMPRESSION: Moderate cardiomegaly and pulmonary vascular congestion. Electronically Signed   By: Elon Alas M.D.   On: 02/11/2016 02:33   US Renal  02/11/2016  CLINICAL DATA:  Acute renal failure EXAM: RENAL / URINARY TRACT ULTRASOUND COMPLETE COMPARISON:  None in PACs FINDINGS: Right Kidney: Length: 12.1 cm. The renal cortical echotexture is increased and greater than that of the adjacent liver. There is no hydronephrosis nor discrete mass. Left Kidney: Length: 10.6 cm. The cortical echotexture is increased. There is no hydronephrosis. There is no discrete mass. Bladder: The partially distended urinary bladder is normal in appearance. Bilateral ureteral jets are observed. IMPRESSION: Increased renal cortical echotexture consistent with medical renal disease. There is no evidence of obstruction. Electronically Signed   By: David  Martinique M.D.   On: 02/11/2016 15:46     CBC  Recent Labs Lab 02/11/16 0155  WBC 12.9*  HGB 7.1*  HCT 26.1*  PLT 292  MCV 59.4*  MCH 16.2*  MCHC 27.3*  RDW 21.4*    Chemistries   Recent Labs Lab 02/11/16 0155 02/12/16 0449  NA 138 141  K 3.7 3.6  CL 110 109  CO2 20* 20*  GLUCOSE 116* 110*  BUN 39* 34*  CREATININE 3.08* 2.71*  CALCIUM 8.3* 8.8*  AST 19  --   ALT 13*  --   ALKPHOS 67  --   BILITOT 0.9  --    ------------------------------------------------------------------------------------------------------------------ estimated creatinine clearance is 43.6 mL/min (by C-G formula based on Cr of 2.71). ------------------------------------------------------------------------------------------------------------------  Recent Labs  02/11/16 1317  HGBA1C 5.9    ------------------------------------------------------------------------------------------------------------------  Recent Labs  02/12/16 0449  CHOL 89  HDL 30*  LDLCALC 46  TRIG 65  CHOLHDL 3.0   ------------------------------------------------------------------------------------------------------------------  Recent Labs  02/11/16 1317  TSH 2.125   ------------------------------------------------------------------------------------------------------------------  Recent Labs  02/11/16 0722 02/11/16 1317  VITAMINB12 352  --   FOLATE  --  8.4  FERRITIN  --  9*  TIBC  --  506*  IRON  --  13*    Coagulation profile  Recent Labs Lab 02/12/16 0708  INR 1.16    No results for input(s): DDIMER in the last 72 hours.  Cardiac Enzymes  Recent Labs Lab 02/11/16 1317 02/11/16 1920 02/12/16 0449  TROPONINI 0.05* 0.20* 0.22*   ------------------------------------------------------------------------------------------------------------------ Invalid input(s): POCBNP    Assessment & Plan  Patient is a 30 year old presents with swelling short of breath  1. Acute CHF type unknown : Continue to monitor for now Lasix has been discontinued. Patient did diurese well with Lasix.  Nephrology and cardiology following  Echocardiogram of the heart pending  2. Anemia due to iron deficiency last hemoglobin in March 2015 was normalHemoglobin currently pending. Severe iron deficiency. I will give her a dose of IV iron  3. acute renal failure on chronic renal failure could be related to fluid overload Appreciate nephrology input plan for workup for nephrotic syndrome 4. Elevated troponin could be demand ischemia or due to non-ST MI continue heparin drip cardiology following echocardiogram of the heart pending  5. Miscellaneous heparin for DVT prophylaxis               Code Status Orders        Start     Ordered   02/11/16 0708  Full code   Continuous      02/11/16 0707    Code Status History    Date Active Date Inactive Code Status Order ID Comments User Context   This patient has a current code status but no historical code status.           Consults  Cardiology nephrology  DVT Prophylaxis heparin  Lab Results  Component Value Date   PLT 292 02/11/2016     Time Spent in minutes   93min Greater than 50% of time spent in care coordination and counseling patient regarding the condition and plan of care.   Dustin Flock M.D on 02/12/2016 at 9:15 AM  Between 7am to 6pm - Pager - [phone]  After 6pm go to [email or web address] - password [email or web address] [facility]   Office  [phone]

## 2016-02-12 NOTE — Progress Notes (Signed)
*  PRELIMINARY RESULTS* Echocardiogram 2D Echocardiogram has been performed.  [name] 02/12/2016, 7:39 AM

## 2016-02-12 NOTE — Progress Notes (Signed)
Initial Heart Failure Clinic appointment scheduled for Feb 19, 2016 at 1:00pm. Thank you.

## 2016-02-12 NOTE — Progress Notes (Signed)
ANTICOAGULATION CONSULT NOTE - Initial Consult  Pharmacy Consult for heparin Indication: chest pain/ACS  No Known Allergies  Patient Measurements: Height: 5\' 6"  (167.6 cm) Weight: (!) 305 lb 9.6 oz (138.619 kg) IBW/kg (Calculated) : 59.3 Heparin Dosing Weight: 92.7 kg  Vital Signs: Temp: 98 F (36.7 C) (04/28 0602) Temp Source: Oral (04/28 0602) BP: 172/99 mmHg (04/28 0602) Pulse Rate: 114 (04/28 0602)  Labs:  Recent Labs  02/11/16 0155  02/11/16 1317 02/11/16 1920 02/12/16 0449  HGB 7.1*  --   --   --   --   HCT 26.1*  --   --   --   --   PLT 292  --   --   --   --   CREATININE 3.08*  --   --   --  2.71*  TROPONINI 0.03  < > 0.05* 0.20* 0.22*  < > = values in this interval not displayed.  Estimated Creatinine Clearance: 43.6 mL/min (by C-G formula based on Cr of 2.71).   Medical History: Past Medical History  Diagnosis Date  . Hypertension   . Asthma   . CKD (chronic kidney disease), stage III   . Anemia of chronic disease   . Morbid obesity (HCC)     Medications:  Infusions:  . heparin      Assessment: 29 yof with positive troponin, pharmacy consulted for heparin dosing. Note, patient received 5000 units subcutaneously x 1 at 0618 this morning.  Goal of Therapy:  Heparin level 0.3-0.7 units/ml Monitor platelets by anticoagulation protocol: Yes   Plan:  Start heparin infusion at 1200 units/hr Check anti-Xa level in 6 hours and daily while on heparin Continue to monitor H&H and platelets  Laural Benes, Pharm.D., BCPS Clinical Pharmacist 02/12/2016,6:48 AM

## 2016-02-12 NOTE — Progress Notes (Signed)
ANTICOAGULATION CONSULT NOTE - Initial Consult  Pharmacy Consult for heparin Indication: chest pain/ACS  No Known Allergies  Patient Measurements: Height: 5\' 6"  (167.6 cm) Weight: (!) 305 lb 9.6 oz (138.619 kg) IBW/kg (Calculated) : 59.3 Heparin Dosing Weight: 92.7 kg  Vital Signs: Temp: 98.5 F (36.9 C) (04/28 1122) Temp Source: Oral (04/28 1122) BP: 151/93 mmHg (04/28 1122) Pulse Rate: 100 (04/28 1122)  Labs:  Recent Labs  02/11/16 0155  02/11/16 1317 02/11/16 1920 02/12/16 0449 02/12/16 0708 02/12/16 1354  HGB 7.1*  --   --   --  7.6*  --   --   HCT 26.1*  --   --   --  27.6*  --   --   PLT 292  --   --   --  326  --   --   APTT  --   --   --   --   --  28  --   LABPROT  --   --   --   --   --  15.0  --   INR  --   --   --   --   --  1.16  --   HEPARINUNFRC  --   --   --   --   --   --  <0.10*  CREATININE 3.08*  --   --   --  2.71*  --   --   TROPONINI 0.03  < > 0.05* 0.20* 0.22*  --   --   < > = values in this interval not displayed.  Estimated Creatinine Clearance: 43.6 mL/min (by C-G formula based on Cr of 2.71).   Medical History: Past Medical History  Diagnosis Date  . Hypertension   . Asthma   . CKD (chronic kidney disease), stage III   . Anemia of chronic disease   . Morbid obesity ([place])     Medications:  Infusions:  . heparin 1,200 Units/hr (02/12/16 [id number])    Assessment: 84 yof with positive troponin, pharmacy consulted for heparin dosing. Note, patient received 5000 units subcutaneously x 1 at 0618 this morning.  Goal of Therapy:  Heparin level 0.3-0.7 units/ml Monitor platelets by anticoagulation protocol: Yes   Plan:  Heparin level is below goal. Drip paused only for 15 minutes per RN. Will bolus heparin 2800 units iv and increase infusion to 1500 units/hr. Will recheck a HL in 6 hours.   Ulice Dash D, Pharm.D., BCPS Clinical Pharmacist 02/12/2016,4:25 PM

## 2016-02-12 NOTE — Progress Notes (Signed)
Notified MD of critical troponin 0.22; subQ heparin discontinued and heparin pharmacy consult order placed by Dr. Estanislado Pandy. Patient has not complained of any chest pain, VSS, and ST on tele. Nursing staff will continue to monitor. Earleen Reaper, RN

## 2016-02-13 DIAGNOSIS — N183 Chronic kidney disease, stage 3 (moderate): Secondary | ICD-10-CM

## 2016-02-13 DIAGNOSIS — N920 Excessive and frequent menstruation with regular cycle: Secondary | ICD-10-CM

## 2016-02-13 DIAGNOSIS — D631 Anemia in chronic kidney disease: Secondary | ICD-10-CM | POA: Insufficient documentation

## 2016-02-13 DIAGNOSIS — N189 Chronic kidney disease, unspecified: Secondary | ICD-10-CM

## 2016-02-13 DIAGNOSIS — D509 Iron deficiency anemia, unspecified: Secondary | ICD-10-CM

## 2016-02-13 LAB — BASIC METABOLIC PANEL
Anion gap: 7 (ref 5–15)
BUN: 37 mg/dL — AB (ref 6–20)
CHLORIDE: 108 mmol/L (ref 101–111)
CO2: 25 mmol/L (ref 22–32)
Calcium: 8.7 mg/dL — ABNORMAL LOW (ref 8.9–10.3)
Creatinine, Ser: 2.93 mg/dL — ABNORMAL HIGH (ref 0.44–1.00)
GFR, EST AFRICAN AMERICAN: 24 mL/min — AB (ref >60)
GFR, EST NON AFRICAN AMERICAN: 20 mL/min — AB (ref >60)
Glucose, Bld: 101 mg/dL — ABNORMAL HIGH (ref 65–99)
POTASSIUM: 3.8 mmol/L (ref 3.5–5.1)
SODIUM: 140 mmol/L (ref 135–145)

## 2016-02-13 LAB — CBC
HEMATOCRIT: 26.4 % — AB (ref 35.0–47.0)
Hemoglobin: 7.4 g/dL — ABNORMAL LOW (ref 12.0–16.0)
MCH: 16.4 pg — ABNORMAL LOW (ref 26.0–34.0)
MCHC: 27.9 g/dL — ABNORMAL LOW (ref 32.0–36.0)
MCV: 59 fL — AB (ref 80.0–100.0)
PLATELETS: 289 10*3/uL (ref 150–440)
RBC: 4.48 MIL/uL (ref 3.80–5.20)
RDW: 20.8 % — AB (ref 11.5–14.5)
WBC: 11.2 10*3/uL — AB (ref 3.6–11.0)

## 2016-02-13 LAB — RETICULOCYTES
RBC.: 4.56 MIL/uL (ref 3.80–5.20)
RETIC COUNT ABSOLUTE: 145.9 10*3/uL (ref 19.0–183.0)
Retic Ct Pct: 3.2 % — ABNORMAL HIGH (ref 0.4–3.1)

## 2016-02-13 LAB — IRON AND TIBC
IRON: 163 ug/dL (ref 28–170)
Saturation Ratios: 43 % — ABNORMAL HIGH (ref 10.4–31.8)
TIBC: 384 ug/dL (ref 250–450)
UIBC: 221 ug/dL

## 2016-02-13 LAB — FOLATE: FOLATE: 6.8 ng/mL (ref >5.9)

## 2016-02-13 LAB — ANA W/REFLEX IF POSITIVE: Anti Nuclear Antibody(ANA): NEGATIVE

## 2016-02-13 LAB — PREPARE RBC (CROSSMATCH)

## 2016-02-13 LAB — HEPARIN LEVEL (UNFRACTIONATED): Heparin Unfractionated: 0.27 IU/mL — ABNORMAL LOW (ref 0.30–0.70)

## 2016-02-13 LAB — ABO/RH: ABO/RH(D): A POS

## 2016-02-13 LAB — FERRITIN: FERRITIN: 45 ng/mL (ref 11–307)

## 2016-02-13 MED ORDER — SODIUM CHLORIDE 0.9% FLUSH: 10.0000 mL | INTRAVENOUS | Status: DC | PRN

## 2016-02-13 MED ORDER — HEPARIN BOLUS VIA INFUSION
1400.0000 [IU] | Freq: Once | INTRAVENOUS | Status: AC
  Administered 2016-02-13: 1400 [IU] via INTRAVENOUS
  Filled 2016-02-13: qty 1400

## 2016-02-13 MED ORDER — DIPHENHYDRAMINE HCL 25 MG PO CAPS
25.0000 mg | ORAL_CAPSULE | Freq: Once | ORAL | Status: AC
  Administered 2016-02-13: 25 mg via ORAL
  Filled 2016-02-13: qty 1

## 2016-02-13 MED ORDER — SODIUM CHLORIDE 0.9 % IV SOLN
250.0000 mL | Freq: Once | INTRAVENOUS | Status: AC
  Administered 2016-02-13: 250 mL via INTRAVENOUS

## 2016-02-13 MED ORDER — ACETAMINOPHEN 325 MG PO TABS
650.0000 mg | ORAL_TABLET | Freq: Once | ORAL | Status: AC
  Administered 2016-02-13: 650 mg via ORAL
  Filled 2016-02-13: qty 2

## 2016-02-13 NOTE — Progress Notes (Signed)
Tylenol/Benadryl given for premeds, s/s transfusion reaction reviewed with pt.  Denies need.

## 2016-02-13 NOTE — Progress Notes (Signed)
ANTICOAGULATION CONSULT NOTE - Initial Consult  Pharmacy Consult for heparin Indication: chest pain/ACS  No Known Allergies  Patient Measurements: Height: 5\' 6"  (167.6 cm) Weight: (!) 305 lb 9.6 oz (138.619 kg) IBW/kg (Calculated) : 59.3 Heparin Dosing Weight: 92.7 kg  Vital Signs: Temp: 98.2 F (36.8 C) (04/28 1953) Temp Source: Oral (04/28 1953) BP: 121/68 mmHg (04/28 1953) Pulse Rate: 90 (04/28 1953)  Labs:  Recent Labs  02/11/16 0155  02/11/16 1317 02/11/16 1920 02/12/16 0449 02/12/16 0708 02/12/16 1354 02/12/16 2330  HGB 7.1*  --   --   --  7.6*  --   --   --   HCT 26.1*  --   --   --  27.6*  --   --   --   PLT 292  --   --   --  326  --   --   --   APTT  --   --   --   --   --  28  --   --   LABPROT  --   --   --   --   --  15.0  --   --   INR  --   --   --   --   --  1.16  --   --   HEPARINUNFRC  --   --   --   --   --   --  <0.10* 0.20*  CREATININE 3.08*  --   --   --  2.71*  --   --   --   TROPONINI 0.03  < > 0.05* 0.20* 0.22*  --   --   --   < > = values in this interval not displayed.  Estimated Creatinine Clearance: 43.6 mL/min (by C-G formula based on Cr of 2.71).   Medical History: Past Medical History  Diagnosis Date  . Hypertension   . Asthma   . CKD (chronic kidney disease), stage III   . Anemia of chronic disease   . Morbid obesity ([place])     Medications:  Infusions:  . heparin 1,500 Units/hr (02/12/16 1754)    Assessment: 64 yof with positive troponin, pharmacy consulted for heparin dosing. Note, patient received 5000 units subcutaneously x 1 at 0618 this morning.  Goal of Therapy:  Heparin level 0.3-0.7 units/ml Monitor platelets by anticoagulation protocol: Yes   Plan:  Heparin level subtherapeutic. 1400 units IV x 1 bolus and increase rate to 1650 units/hr. Will recheck a HL in 6 hours.   Laural Benes, Pharm.D., BCPS Clinical Pharmacist 02/13/2016,12:06 AM

## 2016-02-13 NOTE — Progress Notes (Signed)
Subjective:  Serum creatinine remains high at 2.91/ GFR 24 urine output 2275 cc. Lower Extremity edema appears to be improved.  Urine protein/creatinine ratio is 4.0 suggesting nephrotic range proteinuria Given iv iron this admission   Objective:  Vital signs in last 24 hours:  Temp:  [97.5 F (36.4 C)-98.7 F (37.1 C)] 97.5 F (36.4 C) (04/29 1133) Pulse Rate:  [59-93] 85 (04/29 1133) Resp:  [17-20] 19 (04/29 1133) BP: (109-136)/(61-83) 121/61 mmHg (04/29 1133) SpO2:  [97 %-100 %] 100 % (04/29 1133) Weight:  [136.76 kg (301 lb 8 oz)] 136.76 kg (301 lb 8 oz) (04/29 0453)  Weight change: -8.482 kg (-18 lb 11.2 oz) Filed Weights   02/11/16 1033 02/12/16 0602 02/13/16 0453  Weight: 145.242 kg (320 lb 3.2 oz) 138.619 kg (305 lb 9.6 oz) 136.76 kg (301 lb 8 oz)    Intake/Output:    Intake/Output Summary (Last 24 hours) at 02/13/16 1544 Last data filed at 02/13/16 1411  Gross per 24 hour  Intake 956.45 ml  Output   2675 ml  Net -1718.55 ml     Physical Exam: General: No acute distress, sitting up in the bed  HEENT Pale conjunctiva, moist oral mucous membranes  Neck supple  Pulm/lungs normal effort,clear to auscultation bilaterally  CVS/Heart Regular, tachycardic, S3 gallop  Abdomen:  Soft, nontender, nondistended  Extremities: 2+ pitting edema bilaterally  Neurologic: Alert, oriented  Skin: No acute rashes          Basic Metabolic Panel:   Recent Labs Lab 02/11/16 0155 02/12/16 0449 02/13/16 0701  NA 138 141 140  K 3.7 3.6 3.8  CL 110 109 108  CO2 20* 20* 25  GLUCOSE 116* 110* 101*  BUN 39* 34* 37*  CREATININE 3.08* 2.71* 2.93*  CALCIUM 8.3* 8.8* 8.7*     CBC:  Recent Labs Lab 02/11/16 0155 02/12/16 0449 02/13/16 0701  WBC 12.9* 14.5* 11.2*  NEUTROABS  --  12.3*  --   HGB 7.1* 7.6* 7.4*  HCT 26.1* 27.6* 26.4*  MCV 59.4* 58.4* 59.0*  PLT 292 326 289      Microbiology:  No results found for this or any previous visit (from the past 720  hour(s)).  Coagulation Studies:  Recent Labs  02/12/16 0708  LABPROT 15.0  INR 1.16    Urinalysis:  Recent Labs  02/11/16 0200  COLORURINE YELLOW*  LABSPEC 1.011  PHURINE 5.0  GLUCOSEU 50*  HGBUR NEGATIVE  BILIRUBINUR NEGATIVE  KETONESUR NEGATIVE  PROTEINUR >500*  NITRITE NEGATIVE  LEUKOCYTESUR NEGATIVE      Imaging: US Renal  02/11/2016  CLINICAL DATA:  Acute renal failure EXAM: RENAL / URINARY TRACT ULTRASOUND COMPLETE COMPARISON:  None in PACs FINDINGS: Right Kidney: Length: 12.1 cm. The renal cortical echotexture is increased and greater than that of the adjacent liver. There is no hydronephrosis nor discrete mass. Left Kidney: Length: 10.6 cm. The cortical echotexture is increased. There is no hydronephrosis. There is no discrete mass. Bladder: The partially distended urinary bladder is normal in appearance. Bilateral ureteral jets are observed. IMPRESSION: Increased renal cortical echotexture consistent with medical renal disease. There is no evidence of obstruction. Electronically Signed   By: David  Martinique M.D.   On: 02/11/2016 15:46     Medications:     . sodium chloride  250 mL Intravenous Once  . acetaminophen  650 mg Oral Once  . carvedilol  25 mg Oral BID WC  . cloNIDine  0.1 mg Oral TID  . diphenhydrAMINE  25 mg Oral Once  . isosorbide mononitrate  30 mg Oral BID  . sodium chloride flush  3 mL Intravenous Q12H   sodium chloride, acetaminophen, hydrALAZINE, ondansetron (ZOFRAN) IV, oxyCODONE-acetaminophen, sodium chloride flush, sodium chloride flush  Assessment/ Plan:  30 y.o. female with morbid obesity, malignant hypertension, asthma, chronic kidney disease  1.  Acute renal failure on chronic kidney disease stage III.  Baseline creatinine unknown but it was 2.12 in March 2015 with GFR of 36.  Patient also has nephrotic range proteinuria. Cholesterol is not elevated.  Hemoglobin A1c is 5.9%. Differential diagnosis includes FSGS-primary versus  secondary due to morbid obesity Patient will need a kidney biopsy. if still in the hospital, will arrange for it next week otherwise we can get it done as outpatient ANA, ANCA, Hep B, hep C, HIV, ANA are negative  2. Severe iron deficiency anemia - Feraheme administration  3.  Anasarca 2-D echo - EF 50-55% Currently getting IV Lasix with good result  4.severe hypertension.  Treated with carvedilol.  Avoid ACE inhibitors/ARBat this time with acute renal failure but we will be able to use it once serum creatinine stabilizes. We will add clonidine to her regimen  5. Nephrotic range proteinuria See above   LOS: 2 Johnna Bollier 4/29/20173:44 PM

## 2016-02-13 NOTE — Progress Notes (Signed)
Patient ID: [name], female   DOB: [birth year], 30 y.o.   MRN: [id number] [facility] NAME: [name]    MR#:  [id number]  DATE OF BIRTH:  [birth year]  SUBJECTIVE:  No complaints weak  REVIEW OF SYSTEMS:   Review of Systems  Constitutional: Negative for fever, chills and weight loss.  HENT: Negative for ear discharge, ear pain and nosebleeds.   Eyes: Negative for blurred vision, pain and discharge.  Respiratory: Positive for sputum production. Negative for shortness of breath, wheezing and stridor.   Cardiovascular: Positive for leg swelling. Negative for chest pain, palpitations, orthopnea and PND.  Gastrointestinal: Negative for nausea, vomiting, abdominal pain and diarrhea.  Genitourinary: Negative for urgency and frequency.  Musculoskeletal: Negative for back pain and joint pain.  Neurological: Positive for weakness. Negative for sensory change, speech change and focal weakness.  Psychiatric/Behavioral: Negative for depression and hallucinations. The patient is not nervous/anxious.   All other systems reviewed and are negative.  Tolerating Diet:yes Tolerating PT: not needed  DRUG ALLERGIES:  No Known Allergies  VITALS:  Blood pressure 121/61, pulse 85, temperature 97.5 F (36.4 C), temperature source Oral, resp. rate 19, height 5\' 6"  (1.676 m), weight 136.76 kg (301 lb 8 oz), last menstrual period 01/18/2016, SpO2 100 %.  PHYSICAL EXAMINATION:   Physical Exam  GENERAL:  30 y.o.-year-old patient lying in the bed with no acute distress. obese EYES: Pupils equal, round, reactive to light and accommodation. No scleral icterus. Extraocular muscles intact.  HEENT: Head atraumatic, normocephalic. Oropharynx and nasopharynx clear.  NECK:  Supple, no jugular venous distention. No thyroid enlargement, no tenderness.  LUNGS: Normal breath sounds bilaterally, no wheezing, rales, rhonchi. No use of accessory  muscles of respiration.  CARDIOVASCULAR: S1, S2 normal. No murmurs, rubs, or gallops.  ABDOMEN: Soft, nontender, nondistended. Bowel sounds present. No organomegaly or mass.  EXTREMITIES: No cyanosis, clubbing , +++edema b/l.    NEUROLOGIC: Cranial nerves II through XII are intact. No focal Motor or sensory deficits b/l.   PSYCHIATRIC:  patient is alert and oriented x 3.  SKIN: No obvious rash, lesion, or ulcer.   LABORATORY PANEL:  CBC  Recent Labs Lab 02/13/16 0701  WBC 11.2*  HGB 7.4*  HCT 26.4*  PLT 289    Chemistries   Recent Labs Lab 02/11/16 0155  02/13/16 0701  NA 138  < > 140  K 3.7  < > 3.8  CL 110  < > 108  CO2 20*  < > 25  GLUCOSE 116*  < > 101*  BUN 39*  < > 37*  CREATININE 3.08*  < > 2.93*  CALCIUM 8.3*  < > 8.7*  AST 19  --   --   ALT 13*  --   --   ALKPHOS 67  --   --   BILITOT 0.9  --   --   < > = values in this interval not displayed. Cardiac Enzymes  Recent Labs Lab 02/12/16 0449  TROPONINI 0.22*   RADIOLOGY:  US Renal  02/11/2016  CLINICAL DATA:  Acute renal failure EXAM: RENAL / URINARY TRACT ULTRASOUND COMPLETE COMPARISON:  None in PACs FINDINGS: Right Kidney: Length: 12.1 cm. The renal cortical echotexture is increased and greater than that of the adjacent liver. There is no hydronephrosis nor discrete mass. Left Kidney: Length: 10.6 cm. The cortical echotexture is increased. There is no hydronephrosis. There is no discrete mass. Bladder: The partially  distended urinary bladder is normal in appearance. Bilateral ureteral jets are observed. IMPRESSION: Increased renal cortical echotexture consistent with medical renal disease. There is no evidence of obstruction. Electronically Signed   By: David  Martinique M.D.   On: 02/11/2016 15:46   ASSESSMENT AND PLAN:  [name] is a 30 y.o. female with a known history of Hypertension, bronchial asthma, chronic kidney disease presented to the emergency room because she had swelling in both the legs.  Patient says she has been retaining fluid and swelling in the legs for more than a week. She does not have any primary care physician. She also has a shortness of breath and which has been worsening for the last few days  1. Acute CHF diastolic : Continue to monitor for now Lasix has been discontinued. Patient did diurese well with Lasix.  Nephrology and cardiology following  Echocardiogram noted  2. Anemia due to iron deficiency last hemoglobin in March 2015 was normalHemoglobin currently pending. Severe iron deficiency.recieved dose of IV iron  -hematology to see pt Pt will benefit from 1unit of BT if ok with hematology  3. acute renal failure on chronic renal failure could be related to fluid overload and nephrotic syndrome Appreciate nephrology input plan for workup for nephrotic syndrome -pt will need renal biopsy -will ask IR to do on Monday. Will hold asa  4. Elevated troponin could be demand ischemia or due to non-ST MI continue heparin drip cardiology following echocardiogram of the heart pending  5. Miscellaneous heparin for DVT prophylaxis   Case discussed with Care Management/Social Worker. Management plans discussed with the patient, family and they are in agreement.  CODE STATUS: full DVT Prophylaxis: lovenox TOTAL TIME TAKING CARE OF THIS PATIENT: 30 minutes.  >50% time spent on counselling and coordination of care pt and Dr Candiss Norse  POSSIBLE D/C IN 1-2 DAYS, DEPENDING ON CLINICAL CONDITION.  Note: This dictation was prepared with Dragon dictation along with smaller phrase technology. Any transcriptional errors that result from this process are unintentional.  Vishal Sandlin M.D on 02/13/2016 at 1:46 PM  Between 7am to 6pm - Pager - [phone]  After 6pm go to [email or web address] - password [email or web address] [facility]  [facility]  Office  [phone]  CC: Primary care physician; No PCP Per Patient

## 2016-02-13 NOTE — Progress Notes (Signed)
1 unit PRBC's hung.

## 2016-02-13 NOTE — Plan of Care (Signed)
Problem: Pain Managment: Goal: General experience of comfort will improve Outcome: Progressing PRN medications  Problem: Tissue Perfusion: Goal: Risk factors for ineffective tissue perfusion will decrease Outcome: Progressing Heparin

## 2016-02-13 NOTE — Consult Note (Signed)
[place]  [phone]  Fax:([phone]) [place] DOB: 14-Mar-1986  MR#: [id number]  BTD#:[id number]  Patient Care Team: No Pcp Per Patient as PCP - General (General Practice)  CHIEF COMPLAINT:  Chief Complaint  Patient presents with  . Leg Swelling    INTERVAL HISTORY:  [name] is a 30 y.o. female with a known history of Hypertension, bronchial asthma, chronic kidney disease presented to the emergency room because she had swelling in both the legs. Patient says she has been retaining fluid and swelling in the legs for more than a week She also has a shortness of breath and which has been worsening for the last few days. No history of chest pain. Has history of orthopnea. Ferritin and iron/tibc have been drawn and found to iron deficiency anemia. She reports last menstrual cycle was the first week of April. She saturates a super plus tampon and an overnight pad in 1-2 hours with frequent episodes of bleeding through. Heavy bleeding typically lasts 4 days and her cycle lasts a total of 7 days.   REVIEW OF SYSTEMS:   Review of Systems  Constitutional: Positive for malaise/fatigue. Negative for fever, chills, weight loss and diaphoresis.  HENT: Negative.   Eyes: Negative.   Respiratory: Positive for shortness of breath. Negative for cough, hemoptysis, sputum production and wheezing.   Cardiovascular: Positive for orthopnea and leg swelling. Negative for chest pain, palpitations, claudication and PND.  Gastrointestinal: Negative for heartburn, nausea, vomiting, abdominal pain, diarrhea, constipation, blood in stool and melena.  Genitourinary: Negative.   Musculoskeletal: Negative.   Skin: Negative.   Neurological: Negative for dizziness, tingling, focal weakness, seizures and weakness.  Endo/Heme/Allergies: Does not bruise/bleed easily.  Psychiatric/Behavioral: Negative for depression. The patient is not nervous/anxious and does not have  insomnia.     As per HPI. Otherwise, a complete review of systems is negatve.   PAST MEDICAL HISTORY: Past Medical History  Diagnosis Date  . Hypertension   . Asthma   . CKD (chronic kidney disease), stage III   . Anemia of chronic disease   . Morbid obesity ([place])     PAST SURGICAL HISTORY: Past Surgical History  Procedure Laterality Date  . Tonsillectomy      FAMILY HISTORY Family History  Problem Relation Age of Onset  . Hypertension Mother   . Multiple sclerosis Mother     GYNECOLOGIC HISTORY:  Patient's last menstrual period was 01/18/2016 (exact date).      HEALTH MAINTENANCE: Social History  Substance Use Topics  . Smoking status: Never Smoker   . Smokeless tobacco: None  . Alcohol Use: No     No Known Allergies  Current Facility-Administered Medications  Medication Dose Route Frequency Provider Last Rate Last Dose  . 0.9 %  sodium chloride infusion  250 mL Intravenous PRN Saundra Shelling, MD      . acetaminophen (TYLENOL) tablet 650 mg  650 mg Oral Q4H PRN Saundra Shelling, MD   650 mg at 02/13/16 0745  . carvedilol (COREG) tablet 25 mg  25 mg Oral BID WC Minna Merritts, MD   25 mg at 02/13/16 1643  . cloNIDine (CATAPRES) tablet 0.1 mg  0.1 mg Oral TID Murlean Iba, MD   0.1 mg at 02/13/16 1644  . hydrALAZINE (APRESOLINE) injection 10 mg  10 mg Intravenous Q4H PRN Saundra Shelling, MD   10 mg at 02/12/16 0618  . isosorbide mononitrate (IMDUR) 24 hr tablet 30 mg  30  mg Oral BID Minna Merritts, MD   30 mg at 02/13/16 0904  . ondansetron (ZOFRAN) injection 4 mg  4 mg Intravenous Q6H PRN Saundra Shelling, MD      . oxyCODONE-acetaminophen (PERCOCET/ROXICET) 5-325 MG per tablet 1 tablet  1 tablet Oral Q6H PRN Dustin Flock, MD   1 tablet at 02/12/16 0624  . sodium chloride flush (NS) 0.9 % injection 10 mL  10 mL Intracatheter PRN Evlyn Kanner, NP      . sodium chloride flush (NS) 0.9 % injection 3 mL  3 mL Intravenous Q12H Saundra Shelling, MD   3 mL at 02/13/16  0904  . sodium chloride flush (NS) 0.9 % injection 3 mL  3 mL Intravenous PRN Saundra Shelling, MD        OBJECTIVE: BP 134/76 mmHg  Pulse 83  Temp(Src) 98.3 F (36.8 C) (Oral)  Resp 17  Ht 5' 6"  (1.676 m)  Wt 301 lb 8 oz (136.76 kg)  BMI 48.69 kg/m2  SpO2 100%  LMP 01/18/2016 (Exact Date)   Body mass index is 48.69 kg/(m^2).    ECOG FS:2 - Symptomatic, <50% confined to bed  General: Well-developed, well-nourished, no acute distress. Sitting in chair. Eyes: Pink conjunctiva, anicteric sclera. HEENT: Normocephalic, moist mucous membranes, clear oropharnyx. Lungs: Clear to auscultation bilaterally. Heart: Regular rate and rhythm. Musculoskeletal: 2+ pitting edema, left>right.. Neuro: Alert, answering all questions appropriately. Cranial nerves grossly intact. Skin: No rashes or petechiae noted. Psych: Normal affect.   LAB RESULTS:  Admission on 02/11/2016  Component Date Value Ref Range Status  . Sodium 02/11/2016 138  135 - 145 mmol/L Final  . Potassium 02/11/2016 3.7  3.5 - 5.1 mmol/L Final  . Chloride 02/11/2016 110  101 - 111 mmol/L Final  . CO2 02/11/2016 20* 22 - 32 mmol/L Final  . Glucose, Bld 02/11/2016 116* 65 - 99 mg/dL Final  . BUN 02/11/2016 39* 6 - 20 mg/dL Final  . Creatinine, Ser 02/11/2016 3.08* 0.44 - 1.00 mg/dL Final  . Calcium 02/11/2016 8.3* 8.9 - 10.3 mg/dL Final  . GFR calc non Af Amer 02/11/2016 19* >60 mL/min Final  . GFR calc Af Amer 02/11/2016 22* >60 mL/min Final   Comment: (NOTE) The eGFR has been calculated using the CKD EPI equation. This calculation has not been validated in all clinical situations. eGFR's persistently <60 mL/min signify possible Chronic Kidney Disease.   . Anion gap 02/11/2016 8  5 - 15 Final  . WBC 02/11/2016 12.9* 3.6 - 11.0 K/uL Final  . RBC 02/11/2016 4.40  3.80 - 5.20 MIL/uL Final  . Hemoglobin 02/11/2016 7.1* 12.0 - 16.0 g/dL Final  . HCT 02/11/2016 26.1* 35.0 - 47.0 % Final  . MCV 02/11/2016 59.4* 80.0 - 100.0 fL  Final  . MCH 02/11/2016 16.2* 26.0 - 34.0 pg Final  . MCHC 02/11/2016 27.3* 32.0 - 36.0 g/dL Final  . RDW 02/11/2016 21.4* 11.5 - 14.5 % Final  . Platelets 02/11/2016 292  150 - 440 K/uL Final  . Troponin I 02/11/2016 0.03  <0.031 ng/mL Final   Comment:        NO INDICATION OF MYOCARDIAL INJURY.   . B Natriuretic Peptide 02/11/2016 469.0* 0.0 - 100.0 pg/mL Final  . Total Protein 02/11/2016 6.8  6.5 - 8.1 g/dL Final  . Albumin 02/11/2016 3.1* 3.5 - 5.0 g/dL Final  . AST 02/11/2016 19  15 - 41 U/L Final  . ALT 02/11/2016 13* 14 - 54 U/L Final  . Alkaline  Phosphatase 02/11/2016 67  38 - 126 U/L Final  . Total Bilirubin 02/11/2016 0.9  0.3 - 1.2 mg/dL Final  . Bilirubin, Direct 02/11/2016 0.3  0.1 - 0.5 mg/dL Final  . Indirect Bilirubin 02/11/2016 0.6  0.3 - 0.9 mg/dL Final  . Color, Urine 02/11/2016 YELLOW* YELLOW Final  . APPearance 02/11/2016 CLEAR* CLEAR Final  . Glucose, UA 02/11/2016 50* NEGATIVE mg/dL Final  . Bilirubin Urine 02/11/2016 NEGATIVE  NEGATIVE Final  . Ketones, ur 02/11/2016 NEGATIVE  NEGATIVE mg/dL Final  . Specific Gravity, Urine 02/11/2016 1.011  1.005 - 1.030 Final  . Hgb urine dipstick 02/11/2016 NEGATIVE  NEGATIVE Final  . pH 02/11/2016 5.0  5.0 - 8.0 Final  . Protein, ur 02/11/2016 >500* NEGATIVE mg/dL Final  . Nitrite 02/11/2016 NEGATIVE  NEGATIVE Final  . Leukocytes, UA 02/11/2016 NEGATIVE  NEGATIVE Final  . RBC / HPF 02/11/2016 NONE SEEN  0 - 5 RBC/hpf Final  . WBC, UA 02/11/2016 0-5  0 - 5 WBC/hpf Final  . Bacteria, UA 02/11/2016 NONE SEEN  NONE SEEN Final  . Squamous Epithelial / LPF 02/11/2016 0-5* NONE SEEN Final  . Preg Test, Ur 02/11/2016 NEGATIVE  NEGATIVE Final   Comment:        THE SENSITIVITY OF THIS METHODOLOGY IS >24 mIU/mL   . Troponin I 02/11/2016 0.05* <0.031 ng/mL Final   Comment: READ BACK AND VERIFIED WITH JESSICA CHRISTMAS RN AT 7619 02/11/16 MSS.        PERSISTENTLY INCREASED TROPONIN VALUES IN THE RANGE OF 0.04-0.49 ng/mL CAN  BE SEEN IN:       -UNSTABLE ANGINA       -CONGESTIVE HEART FAILURE       -MYOCARDITIS       -CHEST TRAUMA       -ARRYHTHMIAS       -LATE PRESENTING MYOCARDIAL INFARCTION       -COPD   CLINICAL FOLLOW-UP RECOMMENDED.   Marland Kitchen Troponin I 02/11/2016 0.20* <0.031 ng/mL Final   Comment: PREVIOUS RESULT CALLED AT 1620 02/11/16 MSS.        PERSISTENTLY INCREASED TROPONIN VALUES IN THE RANGE OF 0.04-0.49 ng/mL CAN BE SEEN IN:       -UNSTABLE ANGINA       -CONGESTIVE HEART FAILURE       -MYOCARDITIS       -CHEST TRAUMA       -ARRYHTHMIAS       -LATE PRESENTING MYOCARDIAL INFARCTION       -COPD   CLINICAL FOLLOW-UP RECOMMENDED.   . Weight 02/12/2016 4889.6   Final  . Height 02/12/2016 66   Final  . BP 02/12/2016 170/97   Final  . Troponin I 02/11/2016 0.03  <0.031 ng/mL Final   Comment:        NO INDICATION OF MYOCARDIAL INJURY.   . Iron 02/11/2016 13* 28 - 170 ug/dL Final  . TIBC 02/11/2016 506* 250 - 450 ug/dL Final  . Saturation Ratios 02/11/2016 3* 10.4 - 31.8 % Final  . UIBC 02/11/2016 493   Final  . Ferritin 02/11/2016 9* 11 - 307 ng/mL Final  . Vitamin B-12 02/11/2016 352  180 - 914 pg/mL Final   Comment: (NOTE) This assay is not validated for testing neonatal or myeloproliferative syndrome specimens for Vitamin B12 levels. Performed at [facility]   . Folate 02/11/2016 8.4  >5.9 ng/mL Final  . Fecal Occult Bld 02/12/2016 NEGATIVE  NEGATIVE Final  . Creatinine, Urine 02/11/2016 22   Final  .  Total Protein, Urine 02/11/2016 88   Final   NO NORMAL RANGE ESTABLISHED FOR THIS TEST  . Protein Creatinine Ratio 02/11/2016 4.00* 0.00 - 0.15 mg/mg[Cre] Final  . TSH 02/11/2016 2.125  0.350 - 4.500 uIU/mL Final  . C3 Complement 02/11/2016 141  82 - 167 mg/dL Final   Comment: (NOTE) Performed At: [facility] [place], [place] [id number] Lindon Romp MD [id number]   . Complement C4, Body Fluid 02/11/2016 24  14 - 44 mg/dL Final   Comment:  (NOTE) Performed At: [facility] [place], [place] [id number] Lindon Romp MD [id number]   . Hepatitis B Surface Ag 02/11/2016 Negative  Negative Final   Comment: (NOTE) Performed At: [facility] [place], [place] [id number] Lindon Romp MD [id number]   . HCV Ab 02/11/2016 <0.1  0.0 - 0.9 s/co ratio Final   Comment: (NOTE) Performed At: [facility] [place], [place] [id number] Lindon Romp MD QG:[id number]   . HIV Screen 4th Generation wRfx 02/11/2016 Non Reactive  Non Reactive Final   Comment: (NOTE) Performed At: [facility] [place], [place] [id number] Lindon Romp MD [id number]   . Hep B S Ab 02/11/2016 Non Reactive   Final   Comment: (NOTE)              Non Reactive: Inconsistent with immunity,                            less than 10 mIU/mL              Reactive:     Consistent with immunity,                            greater than 9.9 mIU/mL Performed At: [facility] [place], [place] [id number] Lindon Romp MD SR:[id number]   . PTH 02/11/2016 217* 15 - 65 pg/mL Final   Comment: (NOTE) Performed At: [facility] [place], [place] [id number] Lindon Romp MD TR:[id number]   . C-ANCA 02/11/2016 <1:20  Neg:<1:20 titer Final  . P-ANCA 02/11/2016 <1:20  Neg:<1:20 titer Final   Comment: (NOTE) The presence of positive fluorescence exhibiting P-ANCA or C-ANCA patterns alone is not specific for the diagnosis of Wegener's Granulomatosis (WG) or microscopic polyangiitis. Decisions about treatment should not be based solely on ANCA IFA results.  The International ANCA Group Consensus recommends follow up testing of positive sera with both PR-3 and MPO-ANCA enzyme immunoassays. As many as 5% serum samples are positive only by EIA. Ref. AM J Clin Pathol 1999;111:507-513.   Marland Kitchen Atypical P-ANCA titer 02/11/2016  <1:20  Neg:<1:20 titer Final   Comment: (NOTE) The atypical pANCA pattern has been observed in a significant percentage of patients with ulcerative colitis, primary sclerosing cholangitis and autoimmune hepatitis. Performed At: [facility] [place], [place] [id number] Lindon Romp MD TY:[id number]   . Myeloperoxidase Abs 02/11/2016 <9.0  0.0 - 9.0 U/mL Final  . ANCA Proteinase 3 02/11/2016 <3.5  0.0 - 3.5 U/mL Corrected   Comment: (NOTE) Performed At: [facility] [place], [place] [id number] Lindon Romp MD UY:[id number]   . Hgb A1c MFr Bld 02/11/2016 5.9  4.0 - 6.0 % Final  . Sodium 02/12/2016 141  135 - 145 mmol/L Final  . Potassium  02/12/2016 3.6  3.5 - 5.1 mmol/L Final  . Chloride 02/12/2016 109  101 - 111 mmol/L Final  . CO2 02/12/2016 20* 22 - 32 mmol/L Final  . Glucose, Bld 02/12/2016 110* 65 - 99 mg/dL Final  . BUN 02/12/2016 34* 6 - 20 mg/dL Final  . Creatinine, Ser 02/12/2016 2.71* 0.44 - 1.00 mg/dL Final  . Calcium 02/12/2016 8.8* 8.9 - 10.3 mg/dL Final  . GFR calc non Af Amer 02/12/2016 22* >60 mL/min Final  . GFR calc Af Amer 02/12/2016 26* >60 mL/min Final   Comment: (NOTE) The eGFR has been calculated using the CKD EPI equation. This calculation has not been validated in all clinical situations. eGFR's persistently <60 mL/min signify possible Chronic Kidney Disease.   . Anion gap 02/12/2016 12  5 - 15 Final  . Anit Nuclear Antibody(ANA) 02/12/2016 Negative  Negative Final   Comment: (NOTE) Performed At: [facility] [place], [place] [id number] Lindon Romp MD [id number]   . Cholesterol 02/12/2016 89  0 - 200 mg/dL Final  . Triglycerides 02/12/2016 65  <150 mg/dL Final  . HDL 02/12/2016 30* >40 mg/dL Final  . Total CHOL/HDL Ratio 02/12/2016 3.0   Final  . VLDL 02/12/2016 13  0 - 40 mg/dL Final  . LDL Cholesterol 02/12/2016 46  0 - 99 mg/dL Final   Comment:        Total  Cholesterol/HDL:CHD Risk Coronary Heart Disease Risk Table                     Men   Women  1/2 Average Risk   3.4   3.3  Average Risk       5.0   4.4  2 X Average Risk   9.6   7.1  3 X Average Risk  23.4   11.0        Use the calculated Patient Ratio above and the CHD Risk Table to determine the patient's CHD Risk.        ATP III CLASSIFICATION (LDL):  <100     mg/dL   Optimal  100-129  mg/dL   Near or Above                    Optimal  130-159  mg/dL   Borderline  160-189  mg/dL   High  >190     mg/dL   Very High   . Troponin I 02/12/2016 0.22* <0.031 ng/mL Final   Comment: PREVIOUS RESULT CALLED BY MSS @ 9381 ON 02/11/2016 CAF        PERSISTENTLY INCREASED TROPONIN VALUES IN THE RANGE OF 0.04-0.49 ng/mL CAN BE SEEN IN:       -UNSTABLE ANGINA       -CONGESTIVE HEART FAILURE       -MYOCARDITIS       -CHEST TRAUMA       -ARRYHTHMIAS       -LATE PRESENTING MYOCARDIAL INFARCTION       -COPD   CLINICAL FOLLOW-UP RECOMMENDED.   Marland Kitchen aPTT 02/12/2016 28  24 - 36 seconds Final  . Prothrombin Time 02/12/2016 15.0  11.4 - 15.0 seconds Final  . INR 02/12/2016 1.16   Final  . Heparin Unfractionated 02/12/2016 <0.10* 0.30 - 0.70 IU/mL Final   Comment:        IF HEPARIN RESULTS ARE BELOW EXPECTED VALUES, AND PATIENT DOSAGE HAS BEEN CONFIRMED, SUGGEST FOLLOW UP TESTING OF ANTITHROMBIN III LEVELS.   . WBC  02/12/2016 14.5* 3.6 - 11.0 K/uL Final  . RBC 02/12/2016 4.72  3.80 - 5.20 MIL/uL Final  . Hemoglobin 02/12/2016 7.6* 12.0 - 16.0 g/dL Final   RESULT REPEATED AND VERIFIED  . HCT 02/12/2016 27.6* 35.0 - 47.0 % Final   RESULT REPEATED AND VERIFIED  . MCV 02/12/2016 58.4* 80.0 - 100.0 fL Final  . MCH 02/12/2016 16.1* 26.0 - 34.0 pg Final  . MCHC 02/12/2016 27.5* 32.0 - 36.0 g/dL Final  . RDW 02/12/2016 21.6* 11.5 - 14.5 % Final  . Platelets 02/12/2016 326  150 - 440 K/uL Final  . Neutrophils Relative % 02/12/2016 84   Final  . Lymphocytes Relative 02/12/2016 9   Final  .  Monocytes Relative 02/12/2016 5   Final  . Eosinophils Relative 02/12/2016 1   Final  . Basophils Relative 02/12/2016 1   Final  . Neutro Abs 02/12/2016 12.3* 1.4 - 6.5 K/uL Final  . Lymphs Abs 02/12/2016 1.3  1.0 - 3.6 K/uL Final  . Monocytes Absolute 02/12/2016 0.7  0.2 - 0.9 K/uL Final  . Eosinophils Absolute 02/12/2016 0.1  0 - 0.7 K/uL Final  . Basophils Absolute 02/12/2016 0.1  0 - 0.1 K/uL Final  . Heparin Unfractionated 02/12/2016 0.20* 0.30 - 0.70 IU/mL Final   Comment:        IF HEPARIN RESULTS ARE BELOW EXPECTED VALUES, AND PATIENT DOSAGE HAS BEEN CONFIRMED, SUGGEST FOLLOW UP TESTING OF ANTITHROMBIN III LEVELS.   . Comment: 02/11/2016 Comment   Final   Comment: (NOTE) Non reactive HCV antibody screen is consistent with no HCV infection, unless recent infection is suspected or other evidence exists to indicate HCV infection. Performed At: [facility] [place], [place] [id number] Lindon Romp MD [id number]   . Sodium 02/13/2016 140  135 - 145 mmol/L Final  . Potassium 02/13/2016 3.8  3.5 - 5.1 mmol/L Final  . Chloride 02/13/2016 108  101 - 111 mmol/L Final  . CO2 02/13/2016 25  22 - 32 mmol/L Final  . Glucose, Bld 02/13/2016 101* 65 - 99 mg/dL Final  . BUN 02/13/2016 37* 6 - 20 mg/dL Final  . Creatinine, Ser 02/13/2016 2.93* 0.44 - 1.00 mg/dL Final  . Calcium 02/13/2016 8.7* 8.9 - 10.3 mg/dL Final  . GFR calc non Af Amer 02/13/2016 20* >60 mL/min Final  . GFR calc Af Amer 02/13/2016 24* >60 mL/min Final   Comment: (NOTE) The eGFR has been calculated using the CKD EPI equation. This calculation has not been validated in all clinical situations. eGFR's persistently <60 mL/min signify possible Chronic Kidney Disease.   . Anion gap 02/13/2016 7  5 - 15 Final  . WBC 02/13/2016 11.2* 3.6 - 11.0 K/uL Final  . RBC 02/13/2016 4.48  3.80 - 5.20 MIL/uL Final  . Hemoglobin 02/13/2016 7.4* 12.0 - 16.0 g/dL Final   RESULT REPEATED AND VERIFIED  .  HCT 02/13/2016 26.4* 35.0 - 47.0 % Final  . MCV 02/13/2016 59.0* 80.0 - 100.0 fL Final  . MCH 02/13/2016 16.4* 26.0 - 34.0 pg Final  . MCHC 02/13/2016 27.9* 32.0 - 36.0 g/dL Final  . RDW 02/13/2016 20.8* 11.5 - 14.5 % Final  . Platelets 02/13/2016 289  150 - 440 K/uL Final  . Heparin Unfractionated 02/13/2016 0.27* 0.30 - 0.70 IU/mL Final   Comment:        IF HEPARIN RESULTS ARE BELOW EXPECTED VALUES, AND PATIENT DOSAGE HAS BEEN CONFIRMED, SUGGEST FOLLOW UP TESTING OF ANTITHROMBIN III LEVELS.   Marland Kitchen  Folate 02/13/2016 6.8  >5.9 ng/mL Final  . Iron 02/13/2016 163  28 - 170 ug/dL Final  . TIBC 02/13/2016 384  250 - 450 ug/dL Final  . Saturation Ratios 02/13/2016 43* 10.4 - 31.8 % Final  . UIBC 02/13/2016 221   Final  . Ferritin 02/13/2016 45  11 - 307 ng/mL Final  . Retic Ct Pct 02/13/2016 3.2* 0.4 - 3.1 % Final  . RBC. 02/13/2016 4.56  3.80 - 5.20 MIL/uL Final  . Retic Count, Manual 02/13/2016 145.9  19.0 - 183.0 K/uL Final  . Order Confirmation 02/13/2016 ORDER PROCESSED BY BLOOD BANK   Final  . ABO/RH(D) 02/13/2016 A POS   Final  . Antibody Screen 02/13/2016 NEG   Final  . Sample Expiration 02/13/2016 02/16/2016   Final  . Unit Number 02/13/2016 [id number]   Final  . Blood Component Type 02/13/2016 RBC, LR IRR   Final  . Unit division 02/13/2016 00   Final  . Status of Unit 02/13/2016 ISSUED   Final  . Transfusion Status 02/13/2016 OK TO TRANSFUSE   Final  . Crossmatch Result 02/13/2016 Compatible   Final  . ABO/RH(D) 02/13/2016 A POS   Final    STUDIES: Dg Chest 2 View  02/11/2016  CLINICAL DATA:  LEFT leg swelling and drainage for 1 week. Diagnosed with cellulitis. History of hypertension. EXAM: CHEST  2 VIEW COMPARISON:  None available for comparison at time of study interpretation. FINDINGS: The cardiac silhouette is moderately enlarged, mediastinal silhouette is nonsuspicious. Pulmonary vascular congestion without pleural effusion or focal consolidation. No pneumothorax.  Large body habitus. Osseous structures are nonsuspicious. IMPRESSION: Moderate cardiomegaly and pulmonary vascular congestion. Electronically Signed   By: Elon Alas M.D.   On: 02/11/2016 02:33   US Renal  02/11/2016  CLINICAL DATA:  Acute renal failure EXAM: RENAL / URINARY TRACT ULTRASOUND COMPLETE COMPARISON:  None in PACs FINDINGS: Right Kidney: Length: 12.1 cm. The renal cortical echotexture is increased and greater than that of the adjacent liver. There is no hydronephrosis nor discrete mass. Left Kidney: Length: 10.6 cm. The cortical echotexture is increased. There is no hydronephrosis. There is no discrete mass. Bladder: The partially distended urinary bladder is normal in appearance. Bilateral ureteral jets are observed. IMPRESSION: Increased renal cortical echotexture consistent with medical renal disease. There is no evidence of obstruction. Electronically Signed   By: David  Martinique M.D.   On: 02/11/2016 15:46    ASSESSMENT:  Iron deficiency anemia.  PLAN:   1. IDA. Most recent Hgb in 2015 was wnl. In the setting of acute on chronic renal failure, CHF, as well as HTN. Heavy menses may also be a contributing factor. Patient was ordered feraheme this morning which she has received. Will also tranfuse with 1 unit of pRBCs today. Patient will require follow up as an outpatient in 1 week for another feraheme infusion. Follow up should be made with Dr. Mike Gip at time of discharge.   Patient expressed understanding and was in agreement with this plan. She also understands that She can call clinic at any time with any questions, concerns, or complaints.   We will continue follow through this admission. Thank you for the opportunity to provide care to Ms. Lacks. Dr. Grayland Ormond was available for consultation and review of plan of care for this patient.   Evlyn Kanner, NP   02/13/2016 5:38 PM

## 2016-02-13 NOTE — Progress Notes (Signed)
Tolerating blood well. 

## 2016-02-14 ENCOUNTER — Inpatient Hospital Stay: Payer: Managed Care, Other (non HMO)

## 2016-02-14 ENCOUNTER — Other Ambulatory Visit: Payer: Self-pay | Admitting: Family Medicine

## 2016-02-14 ENCOUNTER — Encounter: Payer: Self-pay | Admitting: Obstetrics and Gynecology

## 2016-02-14 DIAGNOSIS — D6489 Other specified anemias: Secondary | ICD-10-CM

## 2016-02-14 LAB — TYPE AND SCREEN
ABO/RH(D): A POS
Antibody Screen: NEGATIVE
UNIT DIVISION: 0

## 2016-02-14 LAB — BASIC METABOLIC PANEL
Anion gap: 7 (ref 5–15)
BUN: 37 mg/dL — AB (ref 6–20)
CALCIUM: 8.5 mg/dL — AB (ref 8.9–10.3)
CO2: 25 mmol/L (ref 22–32)
CREATININE: 2.94 mg/dL — AB (ref 0.44–1.00)
Chloride: 107 mmol/L (ref 101–111)
GFR calc non Af Amer: 20 mL/min — ABNORMAL LOW (ref >60)
GFR, EST AFRICAN AMERICAN: 24 mL/min — AB (ref >60)
GLUCOSE: 84 mg/dL (ref 65–99)
Potassium: 4.1 mmol/L (ref 3.5–5.1)
Sodium: 139 mmol/L (ref 135–145)

## 2016-02-14 LAB — TSH: TSH: 2.104 u[IU]/mL (ref 0.350–4.500)

## 2016-02-14 LAB — VITAMIN B12: VITAMIN B 12: 305 pg/mL (ref 180–914)

## 2016-02-14 LAB — HEMOGLOBIN AND HEMATOCRIT, BLOOD
HCT: 27.4 % — ABNORMAL LOW (ref 35.0–47.0)
Hemoglobin: 7.7 g/dL — ABNORMAL LOW (ref 12.0–16.0)

## 2016-02-14 MED ORDER — MEDROXYPROGESTERONE ACETATE 150 MG/ML IM SUSP
150.0000 mg | Freq: Once | INTRAMUSCULAR | Status: AC
  Administered 2016-02-14: 150 mg via INTRAMUSCULAR
  Filled 2016-02-14: qty 1

## 2016-02-14 MED ORDER — CLONIDINE HCL 0.1 MG PO TABS
0.1000 mg | ORAL_TABLET | Freq: Two times a day (BID) | ORAL | Status: DC
  Administered 2016-02-14 – 2016-02-16 (×5): 0.1 mg via ORAL
  Filled 2016-02-14 (×5): qty 1

## 2016-02-14 NOTE — Consult Note (Signed)
GYNECOLOGY INPATIENT CONSULT NOTE  Reason for Consult: Anemia and history of heavy menses Referring Physician: Fritzi Mandes, MD  [name] is an 30 y.o. P0 female with a known history of Hypertension, bronchial asthma, chronic kidney disease who is currently admitted to the hospital for treatment of acute CHF exacerbation, acute on chronic renal failure, and anemia due to iron deficiency.  Consult placed due to patient with having h/o heavy menses, cycle just started today, and patient with anemia s/p blood transfusion 1 unit PRBCs this admission.  Patient notes that she has not seen a Editor, commissioning in years.     Pertinent Gynecological History: Menses: flow is excessive with use of 5-6 pads or tampons on heaviest days.  Cycles last 5-6 days, uses both super tampons and pads on heaviest days.  Bleeding: heavy  menstrual bleleding, worsening over the past 1-2 years Contraception: none Blood transfusions: 1 unit PRBCs thtis admission Sexually transmitted diseases: no past history Previous GYN Procedures: cervical polypectomy 2015  Last pap: normal Date: between 3-5 years ago    Menstrual History: Menarche age: 5 Patient's last menstrual period was 01/18/2016 (exact date).    Past Medical History  Diagnosis Date  . Hypertension   . Asthma   . CKD (chronic kidney disease), stage III   . Anemia of chronic disease   . Morbid obesity ([place])     Obstetric History   G0   P0   T0   P0   A0   TAB0   SAB0   E0   M0   L0       Past Surgical History  Procedure Laterality Date  . Tonsillectomy      Family History  Problem Relation Age of Onset  . Hypertension Mother   . Multiple sclerosis Mother     Social History   Social History  . Marital Status: Single    Spouse Name: N/A  . Number of Children: N/A  . Years of Education: N/A   Occupational History  . works at [employer] Topics  . Smoking status: Never Smoker   . Smokeless tobacco: Not on  file  . Alcohol Use: No  . Drug Use: No  . Sexual Activity: Not on file   Other Topics Concern  . Not on file   Social History Narrative  . No narrative on file    Allergies: No Known Allergies  Medications:  Prior to Admission:  Prescriptions prior to admission  Medication Sig Dispense Refill Last Dose  . hydrochlorothiazide (HYDRODIURIL) 25 MG tablet Take 25 mg by mouth daily.   02/10/2016 at 1900   Scheduled: . carvedilol  25 mg Oral BID WC  . cloNIDine  0.1 mg Oral BID  . isosorbide mononitrate  30 mg Oral BID  . medroxyPROGESTERone  150 mg Intramuscular Once  . sodium chloride flush  3 mL Intravenous Q12H    Review of Systems  Constitutional: Negative for fever and chills.  HENT: Negative for tinnitus.   Eyes: Negative for blurred vision and pain.  Respiratory: Positive for wheezing. Negative for cough and shortness of breath.   Cardiovascular: Positive for leg swelling. Negative for chest pain and palpitations.  Gastrointestinal: Negative for heartburn, nausea, vomiting, abdominal pain, diarrhea and constipation.  Genitourinary: Negative for dysuria, urgency and frequency.  Musculoskeletal: Negative for myalgias, back pain and joint pain.  Skin: Negative for rash.  Neurological: Negative for dizziness, sensory change, weakness and headaches.  Endo/Heme/Allergies: Negative for polydipsia. Does not bruise/bleed easily.  Psychiatric/Behavioral: Negative for depression and substance abuse.    Blood pressure 127/70, pulse 80, temperature 98.2 F (36.8 C), temperature source Oral, resp. rate 21, height 5' 6"  (1.676 m), weight 303 lb 3.2 oz (137.531 kg), last menstrual period 01/18/2016, SpO2 98 %. Physical Exam  Constitutional: She is oriented to person, place, and time. She appears well-developed. No distress.  HENT:  Head: Normocephalic and atraumatic.  Eyes: Conjunctivae and EOM are normal. Pupils are equal, round, and reactive to light.  Neck: Normal range of  motion. Neck supple. No tracheal deviation present. No thyromegaly present.  Cardiovascular: Normal rate and regular rhythm.  Exam reveals no friction rub.   No murmur heard. Respiratory: Effort normal. No respiratory distress. She has no wheezes.  GI: Soft. Bowel sounds are normal. She exhibits no distension and no mass. There is no tenderness. There is no guarding.  Genitourinary: Vagina normal and uterus normal. No vaginal discharge found.  Cervix appears normal.  Exam limited by body habitus but uterus palpates normal size and shape, non -tender.  No adnexal masses or tenderness.   Musculoskeletal: Normal range of motion. She exhibits edema (trace bilateral LE edema).  Neurological: She is alert and oriented to person, place, and time.  Skin: Skin is warm and dry.  Psychiatric: She has a normal mood and affect.      Labs:  Results for orders placed or performed during the hospital encounter of 02/11/16 (from the past 48 hour(s))  Heparin level (unfractionated)     Status: Abnormal   Collection Time: 02/12/16  1:54 PM  Result Value Ref Range   Heparin Unfractionated <0.10 (L) 0.30 - 0.70 IU/mL    Comment:        IF HEPARIN RESULTS ARE BELOW EXPECTED VALUES, AND PATIENT DOSAGE HAS BEEN CONFIRMED, SUGGEST FOLLOW UP TESTING OF ANTITHROMBIN III LEVELS.   Occult blood card to lab, stool RN will collect     Status: None   Collection Time: 02/12/16  9:17 PM  Result Value Ref Range   Fecal Occult Bld NEGATIVE NEGATIVE  Heparin level (unfractionated)     Status: Abnormal   Collection Time: 02/12/16 11:30 PM  Result Value Ref Range   Heparin Unfractionated 0.20 (L) 0.30 - 0.70 IU/mL    Comment:        IF HEPARIN RESULTS ARE BELOW EXPECTED VALUES, AND PATIENT DOSAGE HAS BEEN CONFIRMED, SUGGEST FOLLOW UP TESTING OF ANTITHROMBIN III LEVELS.   Basic metabolic panel     Status: Abnormal   Collection Time: 02/13/16  7:01 AM  Result Value Ref Range   Sodium 140 135 - 145 mmol/L    Potassium 3.8 3.5 - 5.1 mmol/L   Chloride 108 101 - 111 mmol/L   CO2 25 22 - 32 mmol/L   Glucose, Bld 101 (H) 65 - 99 mg/dL   BUN 37 (H) 6 - 20 mg/dL   Creatinine, Ser 2.93 (H) 0.44 - 1.00 mg/dL   Calcium 8.7 (L) 8.9 - 10.3 mg/dL   GFR calc non Af Amer 20 (L) >60 mL/min   GFR calc Af Amer 24 (L) >60 mL/min    Comment: (NOTE) The eGFR has been calculated using the CKD EPI equation. This calculation has not been validated in all clinical situations. eGFR's persistently <60 mL/min signify possible Chronic Kidney Disease.    Anion gap 7 5 - 15  CBC     Status: Abnormal   Collection Time: 02/13/16  7:01 AM  Result Value Ref Range   WBC 11.2 (H) 3.6 - 11.0 K/uL   RBC 4.48 3.80 - 5.20 MIL/uL   Hemoglobin 7.4 (L) 12.0 - 16.0 g/dL    Comment: RESULT REPEATED AND VERIFIED   HCT 26.4 (L) 35.0 - 47.0 %   MCV 59.0 (L) 80.0 - 100.0 fL   MCH 16.4 (L) 26.0 - 34.0 pg   MCHC 27.9 (L) 32.0 - 36.0 g/dL   RDW 20.8 (H) 11.5 - 14.5 %   Platelets 289 150 - 440 K/uL  Heparin level (unfractionated)     Status: Abnormal   Collection Time: 02/13/16  7:01 AM  Result Value Ref Range   Heparin Unfractionated 0.27 (L) 0.30 - 0.70 IU/mL    Comment:        IF HEPARIN RESULTS ARE BELOW EXPECTED VALUES, AND PATIENT DOSAGE HAS BEEN CONFIRMED, SUGGEST FOLLOW UP TESTING OF ANTITHROMBIN III LEVELS.   Folate     Status: None   Collection Time: 02/13/16  7:01 AM  Result Value Ref Range   Folate 6.8 >5.9 ng/mL  Iron and TIBC     Status: Abnormal   Collection Time: 02/13/16  7:01 AM  Result Value Ref Range   Iron 163 28 - 170 ug/dL   TIBC 384 250 - 450 ug/dL   Saturation Ratios 43 (H) 10.4 - 31.8 %   UIBC 221 ug/dL  Ferritin     Status: None   Collection Time: 02/13/16  7:01 AM  Result Value Ref Range   Ferritin 45 11 - 307 ng/mL  Reticulocytes     Status: Abnormal   Collection Time: 02/13/16  7:01 AM  Result Value Ref Range   Retic Ct Pct 3.2 (H) 0.4 - 3.1 %   RBC. 4.56 3.80 - 5.20 MIL/uL   Retic  Count, Manual 145.9 19.0 - 183.0 K/uL  Prepare RBC     Status: None   Collection Time: 02/13/16  3:41 PM  Result Value Ref Range   Order Confirmation ORDER PROCESSED BY BLOOD BANK   Type and screen [place]     Status: None   Collection Time: 02/13/16  3:41 PM  Result Value Ref Range   ABO/RH(D) A POS    Antibody Screen NEG    Sample Expiration 02/16/2016    Unit Number [id number]    Blood Component Type RBC, LR IRR    Unit division 00    Status of Unit ISSUED,FINAL    Transfusion Status OK TO TRANSFUSE    Crossmatch Result Compatible   ABO/Rh     Status: None   Collection Time: 02/13/16  3:42 PM  Result Value Ref Range   ABO/RH(D) A POS   Basic metabolic panel     Status: Abnormal   Collection Time: 02/14/16  5:32 AM  Result Value Ref Range   Sodium 139 135 - 145 mmol/L   Potassium 4.1 3.5 - 5.1 mmol/L   Chloride 107 101 - 111 mmol/L   CO2 25 22 - 32 mmol/L   Glucose, Bld 84 65 - 99 mg/dL   BUN 37 (H) 6 - 20 mg/dL   Creatinine, Ser 2.94 (H) 0.44 - 1.00 mg/dL   Calcium 8.5 (L) 8.9 - 10.3 mg/dL   GFR calc non Af Amer 20 (L) >60 mL/min   GFR calc Af Amer 24 (L) >60 mL/min    Comment: (NOTE) The eGFR has been calculated using the CKD EPI equation. This calculation has not been  validated in all clinical situations. eGFR's persistently <60 mL/min signify possible Chronic Kidney Disease.    Anion gap 7 5 - 15  Hemoglobin and hematocrit, blood     Status: Abnormal   Collection Time: 02/14/16  5:32 AM  Result Value Ref Range   Hemoglobin 7.7 (L) 12.0 - 16.0 g/dL    Comment: RESULT REPEATED AND VERIFIED   HCT 27.4 (L) 35.0 - 47.0 %    Comment: RESULT REPEATED AND VERIFIED    No results found.  Assessment/Plan: 1. Menorrhagia - Patient notes worsening cycles over past 2 years, has not sought care for this.  She has a normal exam, no evidence of lesions.  All prior labs reviewed, will order TSH as thyroid disease can also be a cause of abnormal  bleeding, and a pelvic ultrasound to evaluate for any structural gynecologic abnormalities (as patient's exam was limited by body habitus).  Will notify patient with these results.  - Discussed management options for abnormal uterine bleeding including tranexamic acid (Lysteda), oral progesterone, Depo Provera, Mirena IUD, endometrial ablation (Novasure/Hydrothermal Ablation) or hysterectomy as definitive surgical management.  Discussed risks and benefits of each method.   Patient desires Depo Provera.  Is due to begin next period today or tomorrow.  Will administer Depo Provera injection today.   2. Anemia, iron deficiency - S/p 1 unit PRBCs this admission - Is scheduled to see Hematologist outpatient for weekly iron infusions.  - Hopefully some improvement will also be seen with management of heavy menses with Depo Provera.   3. Multiple comorbidities (including acute CHF, acute on chronic renal failure, HTN) - Being managed by Hospitalist team.  Patient anticipated to undergo kidney biopsy in a.m.    Thank you for the consult.  Will f/u tomorrow to discuss results of ultrasound.  Patient will need to have f/u in my office in the next 2-3 weeks.     Rubie Maid, MD [facility] [phone] )(office) [phone] (pager)

## 2016-02-14 NOTE — Plan of Care (Signed)
Problem: Activity: Goal: Risk for activity intolerance will decrease Outcome: Progressing Independent in room  Problem: Fluid Volume: Goal: Ability to maintain a balanced intake and output will improve Outcome: Progressing I&O, daily weights

## 2016-02-14 NOTE — Progress Notes (Signed)
Subjective:  Serum creatinine remains high at 2.94/ GFR 24.  urine output is good. Lower Extremity edema appears to be improved. Net neg by over 6 liters since admission Urine protein/creatinine ratio is 4.0 suggesting nephrotic range proteinuria Given iv iron this admission   Objective:  Vital signs in last 24 hours:  Temp:  [97.7 F (36.5 C)-98.7 F (37.1 C)] 98.2 F (36.8 C) (04/30 1057) Pulse Rate:  [80-87] 80 (04/30 1057) Resp:  [16-21] 21 (04/30 1057) BP: (123-134)/(70-81) 127/70 mmHg (04/30 1057) SpO2:  [98 %-100 %] 98 % (04/30 1057) Weight:  [137.531 kg (303 lb 3.2 oz)] 137.531 kg (303 lb 3.2 oz) (04/30 0432)  Weight change: 0.771 kg (1 lb 11.2 oz) Filed Weights   02/12/16 0602 02/13/16 0453 02/14/16 0432  Weight: 138.619 kg (305 lb 9.6 oz) 136.76 kg (301 lb 8 oz) 137.531 kg (303 lb 3.2 oz)    Intake/Output:    Intake/Output Summary (Last 24 hours) at 02/14/16 1347 Last data filed at 02/14/16 1100  Gross per 24 hour  Intake 1150.67 ml  Output   1200 ml  Net -49.33 ml     Physical Exam: General: No acute distress, sitting up in the chair  HEENT Pale conjunctiva, moist oral mucous membranes  Neck supple  Pulm/lungs normal effort,clear to auscultation bilaterally  CVS/Heart Regular, tachycardic,    Abdomen:  Soft, nontender, nondistended  Extremities: 2+ pitting edema bilaterally; Left > Right  Neurologic: Alert, oriented  Skin: No acute rashes          Basic Metabolic Panel:   Recent Labs Lab 02/11/16 0155 02/12/16 0449 02/13/16 0701 02/14/16 0532  NA 138 141 140 139  K 3.7 3.6 3.8 4.1  CL 110 109 108 107  CO2 20* 20* 25 25  GLUCOSE 116* 110* 101* 84  BUN 39* 34* 37* 37*  CREATININE 3.08* 2.71* 2.93* 2.94*  CALCIUM 8.3* 8.8* 8.7* 8.5*     CBC:  Recent Labs Lab 02/11/16 0155 02/12/16 0449 02/13/16 0701 02/14/16 0532  WBC 12.9* 14.5* 11.2*  --   NEUTROABS  --  12.3*  --   --   HGB 7.1* 7.6* 7.4* 7.7*  HCT 26.1* 27.6* 26.4* 27.4*   MCV 59.4* 58.4* 59.0*  --   PLT 292 326 289  --       Microbiology:  No results found for this or any previous visit (from the past 720 hour(s)).  Coagulation Studies:  Recent Labs  02/12/16 0708  LABPROT 15.0  INR 1.16    Urinalysis: No results for input(s): COLORURINE, LABSPEC, PHURINE, GLUCOSEU, HGBUR, BILIRUBINUR, KETONESUR, PROTEINUR, UROBILINOGEN, NITRITE, LEUKOCYTESUR in the last 72 hours.  Invalid input(s): APPERANCEUR    Imaging: No results found.   Medications:     . carvedilol  25 mg Oral BID WC  . cloNIDine  0.1 mg Oral BID  . isosorbide mononitrate  30 mg Oral BID  . sodium chloride flush  3 mL Intravenous Q12H   sodium chloride, acetaminophen, hydrALAZINE, ondansetron (ZOFRAN) IV, oxyCODONE-acetaminophen, sodium chloride flush, sodium chloride flush  Assessment/ Plan:  30 y.o. female with morbid obesity, malignant hypertension, asthma, chronic kidney disease  1.  Acute renal failure on chronic kidney disease stage III.  Baseline creatinine unknown but it was 2.12 in March 2015 with GFR of 36.  Patient also has nephrotic range proteinuria. Cholesterol is not elevated.  Hemoglobin A1c is 5.9%. Differential diagnosis includes FSGS-primary versus secondary due to morbid obesity Patient will need a kidney biopsy. Requested  by VIR on Monday ANA, ANCA, Hep B, hep C, HIV, ANA are negative ? New baseline of 2394/GFR 24  2. Severe iron deficiency anemia - Feraheme administration  3.  Anasarca 2-D echo - EF 50-55% Improved Net neg by 6 liters Will need lasix 40 BID prior to d/c  4.severe hypertension.  Treated with carvedilol.  Avoid ACE inhibitors/ARBat this time with acute renal failure but we will be able to use it once serum creatinine stabilizes. Better controlled with clonidine  5. Nephrotic range proteinuria NPO, renal Bx on Monday possibly by VIR. Send specimen to [facility] nephropathology - f/u as outpatient - advised patient of no heavy lifting  over 20 lbs x 2 weeks/ post procedure   LOS: 3 Britian Jentz 4/30/20171:47 PM

## 2016-02-14 NOTE — Progress Notes (Signed)
Back from ultrasound

## 2016-02-14 NOTE — Progress Notes (Signed)
Patient ID: [name], female   DOB: 1986/10/08, 30 y.o.   MRN: [id number] [facility] NAME: [name]    MR#:  [id number]  DATE OF BIRTH:  August 25, 1986  SUBJECTIVE:  No complaints Weak Started her periods  REVIEW OF SYSTEMS:   Review of Systems  Constitutional: Negative for fever, chills and weight loss.  HENT: Negative for ear discharge, ear pain and nosebleeds.   Eyes: Negative for blurred vision, pain and discharge.  Respiratory: Negative for shortness of breath, wheezing and stridor.   Cardiovascular: Positive for leg swelling. Negative for chest pain, palpitations, orthopnea and PND.  Gastrointestinal: Negative for nausea, vomiting, abdominal pain and diarrhea.  Genitourinary: Negative for urgency and frequency.  Musculoskeletal: Negative for back pain and joint pain.  Neurological: Positive for weakness. Negative for sensory change, speech change and focal weakness.  Psychiatric/Behavioral: Negative for depression and hallucinations. The patient is not nervous/anxious.   All other systems reviewed and are negative.  Tolerating Diet:yes Tolerating PT: not needed  DRUG ALLERGIES:  No Known Allergies  VITALS:  Blood pressure 127/72, pulse 86, temperature 98.6 F (37 C), temperature source Oral, resp. rate 20, height 5\' 6"  (1.676 m), weight 137.531 kg (303 lb 3.2 oz), last menstrual period 01/18/2016, SpO2 98 %.  PHYSICAL EXAMINATION:   Physical Exam  GENERAL:  30 y.o.-year-old patient lying in the bed with no acute distress. Obese, gen pallor EYES: Pupils equal, round, reactive to light and accommodation. No scleral icterus. Extraocular muscles intact.  HEENT: Head atraumatic, normocephalic. Oropharynx and nasopharynx clear.  NECK:  Supple, no jugular venous distention. No thyroid enlargement, no tenderness.  LUNGS: Normal breath sounds bilaterally, no wheezing, rales, rhonchi. No use of accessory  muscles of respiration.  CARDIOVASCULAR: S1, S2 normal. No murmurs, rubs, or gallops.  ABDOMEN: Soft, nontender, nondistended. Bowel sounds present. No organomegaly or mass.  EXTREMITIES: No cyanosis, clubbing , +++edema b/l.    NEUROLOGIC: Cranial nerves II through XII are intact. No focal Motor or sensory deficits b/l.   PSYCHIATRIC:  patient is alert and oriented x 3.  SKIN: No obvious rash, lesion, or ulcer.   LABORATORY PANEL:  CBC  Recent Labs Lab 02/13/16 0701 02/14/16 0532  WBC 11.2*  --   HGB 7.4* 7.7*  HCT 26.4* 27.4*  PLT 289  --     Chemistries   Recent Labs Lab 02/11/16 0155  02/14/16 0532  NA 138  < > 139  K 3.7  < > 4.1  CL 110  < > 107  CO2 20*  < > 25  GLUCOSE 116*  < > 84  BUN 39*  < > 37*  CREATININE 3.08*  < > 2.94*  CALCIUM 8.3*  < > 8.5*  AST 19  --   --   ALT 13*  --   --   ALKPHOS 67  --   --   BILITOT 0.9  --   --   < > = values in this interval not displayed. Cardiac Enzymes  Recent Labs Lab 02/12/16 0449  TROPONINI 0.22*   RADIOLOGY:  No results found. ASSESSMENT AND PLAN:  [name] is a 30 y.o. female with a known history of Hypertension, bronchial asthma, chronic kidney disease presented to the emergency room because she had swelling in both the legs. Patient says she has been retaining fluid and swelling in the legs for more than a week. She does not have any primary  care physician. She also has a shortness of breath and which has been worsening for the last few days  1. Acute CHF diastolic : Continue to monitor for now Lasix has been discontinued. Patient did diurese well with Lasix UOP 6.5 liters so far. Held lasix Nephrology and cardiology following  Echocardiogram noted  2. Anemia due to iron deficiency last hemoglobin in March 2015 was normal Hemoglobin  - Severe iron deficiency.recieved dose of IV iron x2 -hematology to see pt- f/u with Dr Mike Gip for weekly IV ferraheme -s/p 1 unit BT on 4/29  3. acute renal  failure on chronic renal failure could be related to fluid overload and nephrotic syndrome Appreciate nephrology input plan for workup for nephrotic syndrome -pt will need renal biopsy -will ask IR to do on Monday. Will hold asa  4. Elevated troponin could be demand ischemia or due to non-ST MI continue heparin drip cardiology following echocardiogram of the heart pending  5. Miscellaneous heparin for DVT prophylaxis  6. menorrorrahgia Gyn consult ?pelvic USG   Case discussed with Care Management/Social Worker. Management plans discussed with the patient, family and they are in agreement.  CODE STATUS: full DVT Prophylaxis: lovenox TOTAL TIME TAKING CARE OF THIS PATIENT: 30 minutes.  >50% time spent on counselling and coordination of care pt and Dr Candiss Norse  POSSIBLE D/C IN 1-2 DAYS, DEPENDING ON CLINICAL CONDITION.  Note: This dictation was prepared with Dragon dictation along with smaller phrase technology. Any transcriptional errors that result from this process are unintentional.  Alden Feagan M.D on 02/14/2016 at 7:17 AM  Between 7am to 6pm - Pager - [phone]  After 6pm go to [email or web address] - password [email or web address] [facility]  [facility]  Office  [phone]  CC: Primary care physician; No PCP Per Patient

## 2016-02-14 NOTE — Progress Notes (Signed)
To ultrasound via w/c.

## 2016-02-14 NOTE — Consult Note (Signed)
[place]  [phone]  Fax:([phone] [place] DOB: 10/25/1985  MR#: [id number]  QMV#:[id number]  Patient Care Team: No Pcp Per Patient as PCP - General (General Practice)  CHIEF COMPLAINT:  Chief Complaint  Patient presents with  . Leg Swelling    INTERVAL HISTORY:  [name] is a 30 y.o. female with a known history of Hypertension, bronchial asthma, chronic kidney disease presented to the emergency room because she had swelling in both the legs. Patient says she has been retaining fluid and swelling in the legs for more than a week She also has a shortness of breath and which has been worsening for the last few days. No history of chest pain. Has history of orthopnea. Ferritin and iron/tibc have been drawn and found to iron deficiency anemia. She reports last menstrual cycle was the first week of April. She saturates a super plus tampon and an overnight pad in 1-2 hours with frequent episodes of bleeding through. Heavy bleeding typically lasts 4 days and her cycle lasts a total of ~7 days.  She reports having started her cycle this morning. She states that she is feeling somewhat improved from yesterday. She has discussed with Dr. Candiss Norse a renal biopsy and this is scheduled for Monday.   REVIEW OF SYSTEMS:   Review of Systems  Constitutional: Positive for malaise/fatigue. Negative for fever, chills, weight loss and diaphoresis.  HENT: Negative.   Eyes: Negative.   Respiratory: Positive for shortness of breath. Negative for cough, hemoptysis, sputum production and wheezing.   Cardiovascular: Positive for orthopnea and leg swelling. Negative for chest pain, palpitations, claudication and PND.  Gastrointestinal: Negative for heartburn, nausea, vomiting, abdominal pain, diarrhea, constipation, blood in stool and melena.  Genitourinary: Negative.   Musculoskeletal: Negative.   Skin: Negative.   Neurological: Negative for dizziness,  tingling, focal weakness, seizures and weakness.  Endo/Heme/Allergies: Does not bruise/bleed easily.  Psychiatric/Behavioral: Negative for depression. The patient is not nervous/anxious and does not have insomnia.     As per HPI. Otherwise, a complete review of systems is negatve.   PAST MEDICAL HISTORY: Past Medical History  Diagnosis Date  . Hypertension   . Asthma   . CKD (chronic kidney disease), stage III   . Anemia of chronic disease   . Morbid obesity ([place])     PAST SURGICAL HISTORY: Past Surgical History  Procedure Laterality Date  . Tonsillectomy      FAMILY HISTORY Family History  Problem Relation Age of Onset  . Hypertension Mother   . Multiple sclerosis Mother     GYNECOLOGIC HISTORY:  Patient's last menstrual period was 01/18/2016 (exact date).      HEALTH MAINTENANCE: Social History  Substance Use Topics  . Smoking status: Never Smoker   . Smokeless tobacco: None  . Alcohol Use: No     No Known Allergies  Current Facility-Administered Medications  Medication Dose Route Frequency Provider Last Rate Last Dose  . 0.9 %  sodium chloride infusion  250 mL Intravenous PRN Saundra Shelling, MD      . acetaminophen (TYLENOL) tablet 650 mg  650 mg Oral Q4H PRN Saundra Shelling, MD   650 mg at 02/13/16 0745  . carvedilol (COREG) tablet 25 mg  25 mg Oral BID WC Minna Merritts, MD   25 mg at 02/14/16 0747  . cloNIDine (CATAPRES) tablet 0.1 mg  0.1 mg Oral BID Fritzi Mandes, MD   0.1 mg at 02/14/16 0908  .  hydrALAZINE (APRESOLINE) injection 10 mg  10 mg Intravenous Q4H PRN Saundra Shelling, MD   10 mg at 02/12/16 0618  . isosorbide mononitrate (IMDUR) 24 hr tablet 30 mg  30 mg Oral BID Minna Merritts, MD   30 mg at 02/14/16 0908  . medroxyPROGESTERone (DEPO-PROVERA) injection 150 mg  150 mg Intramuscular Once Rubie Maid, MD      . ondansetron (ZOFRAN) injection 4 mg  4 mg Intravenous Q6H PRN Saundra Shelling, MD      . oxyCODONE-acetaminophen (PERCOCET/ROXICET) 5-325  MG per tablet 1 tablet  1 tablet Oral Q6H PRN Dustin Flock, MD   1 tablet at 02/12/16 0624  . sodium chloride flush (NS) 0.9 % injection 10 mL  10 mL Intracatheter PRN Evlyn Kanner, NP      . sodium chloride flush (NS) 0.9 % injection 3 mL  3 mL Intravenous Q12H Saundra Shelling, MD   3 mL at 02/14/16 0908  . sodium chloride flush (NS) 0.9 % injection 3 mL  3 mL Intravenous PRN Saundra Shelling, MD        OBJECTIVE: BP 127/70 mmHg  Pulse 80  Temp(Src) 98.2 F (36.8 C) (Oral)  Resp 21  Ht 5' 6"  (1.676 m)  Wt 303 lb 3.2 oz (137.531 kg)  BMI 48.96 kg/m2  SpO2 98%  LMP 01/18/2016 (Exact Date)   Body mass index is 48.96 kg/(m^2).    ECOG FS:2 - Symptomatic, <50% confined to bed  General: Well-developed, well-nourished, no acute distress. Sitting in chair. Eyes: Pink conjunctiva, anicteric sclera. HEENT: Normocephalic, moist mucous membranes, clear oropharnyx. Lungs: Clear to auscultation bilaterally. Heart: Regular rate and rhythm. Musculoskeletal: 2+ pitting edema, left>right.. Neuro: Alert, answering all questions appropriately. Cranial nerves grossly intact. Skin: No rashes or petechiae noted. Psych: Normal affect.   LAB RESULTS:  Admission on 02/11/2016  Component Date Value Ref Range Status  . Sodium 02/11/2016 138  135 - 145 mmol/L Final  . Potassium 02/11/2016 3.7  3.5 - 5.1 mmol/L Final  . Chloride 02/11/2016 110  101 - 111 mmol/L Final  . CO2 02/11/2016 20* 22 - 32 mmol/L Final  . Glucose, Bld 02/11/2016 116* 65 - 99 mg/dL Final  . BUN 02/11/2016 39* 6 - 20 mg/dL Final  . Creatinine, Ser 02/11/2016 3.08* 0.44 - 1.00 mg/dL Final  . Calcium 02/11/2016 8.3* 8.9 - 10.3 mg/dL Final  . GFR calc non Af Amer 02/11/2016 19* >60 mL/min Final  . GFR calc Af Amer 02/11/2016 22* >60 mL/min Final   Comment: (NOTE) The eGFR has been calculated using the CKD EPI equation. This calculation has not been validated in all clinical situations. eGFR's persistently <60 mL/min signify  possible Chronic Kidney Disease.   . Anion gap 02/11/2016 8  5 - 15 Final  . WBC 02/11/2016 12.9* 3.6 - 11.0 K/uL Final  . RBC 02/11/2016 4.40  3.80 - 5.20 MIL/uL Final  . Hemoglobin 02/11/2016 7.1* 12.0 - 16.0 g/dL Final  . HCT 02/11/2016 26.1* 35.0 - 47.0 % Final  . MCV 02/11/2016 59.4* 80.0 - 100.0 fL Final  . MCH 02/11/2016 16.2* 26.0 - 34.0 pg Final  . MCHC 02/11/2016 27.3* 32.0 - 36.0 g/dL Final  . RDW 02/11/2016 21.4* 11.5 - 14.5 % Final  . Platelets 02/11/2016 292  150 - 440 K/uL Final  . Troponin I 02/11/2016 0.03  <0.031 ng/mL Final   Comment:        NO INDICATION OF MYOCARDIAL INJURY.   . B Natriuretic Peptide 02/11/2016  469.0* 0.0 - 100.0 pg/mL Final  . Total Protein 02/11/2016 6.8  6.5 - 8.1 g/dL Final  . Albumin 02/11/2016 3.1* 3.5 - 5.0 g/dL Final  . AST 02/11/2016 19  15 - 41 U/L Final  . ALT 02/11/2016 13* 14 - 54 U/L Final  . Alkaline Phosphatase 02/11/2016 67  38 - 126 U/L Final  . Total Bilirubin 02/11/2016 0.9  0.3 - 1.2 mg/dL Final  . Bilirubin, Direct 02/11/2016 0.3  0.1 - 0.5 mg/dL Final  . Indirect Bilirubin 02/11/2016 0.6  0.3 - 0.9 mg/dL Final  . Color, Urine 02/11/2016 YELLOW* YELLOW Final  . APPearance 02/11/2016 CLEAR* CLEAR Final  . Glucose, UA 02/11/2016 50* NEGATIVE mg/dL Final  . Bilirubin Urine 02/11/2016 NEGATIVE  NEGATIVE Final  . Ketones, ur 02/11/2016 NEGATIVE  NEGATIVE mg/dL Final  . Specific Gravity, Urine 02/11/2016 1.011  1.005 - 1.030 Final  . Hgb urine dipstick 02/11/2016 NEGATIVE  NEGATIVE Final  . pH 02/11/2016 5.0  5.0 - 8.0 Final  . Protein, ur 02/11/2016 >500* NEGATIVE mg/dL Final  . Nitrite 02/11/2016 NEGATIVE  NEGATIVE Final  . Leukocytes, UA 02/11/2016 NEGATIVE  NEGATIVE Final  . RBC / HPF 02/11/2016 NONE SEEN  0 - 5 RBC/hpf Final  . WBC, UA 02/11/2016 0-5  0 - 5 WBC/hpf Final  . Bacteria, UA 02/11/2016 NONE SEEN  NONE SEEN Final  . Squamous Epithelial / LPF 02/11/2016 0-5* NONE SEEN Final  . Preg Test, Ur 02/11/2016  NEGATIVE  NEGATIVE Final   Comment:        THE SENSITIVITY OF THIS METHODOLOGY IS >24 mIU/mL   . Troponin I 02/11/2016 0.05* <0.031 ng/mL Final   Comment: READ BACK AND VERIFIED WITH JESSICA CHRISTMAS RN AT 1694 02/11/16 MSS.        PERSISTENTLY INCREASED TROPONIN VALUES IN THE RANGE OF 0.04-0.49 ng/mL CAN BE SEEN IN:       -UNSTABLE ANGINA       -CONGESTIVE HEART FAILURE       -MYOCARDITIS       -CHEST TRAUMA       -ARRYHTHMIAS       -LATE PRESENTING MYOCARDIAL INFARCTION       -COPD   CLINICAL FOLLOW-UP RECOMMENDED.   Marland Kitchen Troponin I 02/11/2016 0.20* <0.031 ng/mL Final   Comment: PREVIOUS RESULT CALLED AT 1620 02/11/16 MSS.        PERSISTENTLY INCREASED TROPONIN VALUES IN THE RANGE OF 0.04-0.49 ng/mL CAN BE SEEN IN:       -UNSTABLE ANGINA       -CONGESTIVE HEART FAILURE       -MYOCARDITIS       -CHEST TRAUMA       -ARRYHTHMIAS       -LATE PRESENTING MYOCARDIAL INFARCTION       -COPD   CLINICAL FOLLOW-UP RECOMMENDED.   . Weight 02/12/2016 4889.6   Final  . Height 02/12/2016 66   Final  . BP 02/12/2016 170/97   Final  . Troponin I 02/11/2016 0.03  <0.031 ng/mL Final   Comment:        NO INDICATION OF MYOCARDIAL INJURY.   . Iron 02/11/2016 13* 28 - 170 ug/dL Final  . TIBC 02/11/2016 506* 250 - 450 ug/dL Final  . Saturation Ratios 02/11/2016 3* 10.4 - 31.8 % Final  . UIBC 02/11/2016 493   Final  . Ferritin 02/11/2016 9* 11 - 307 ng/mL Final  . Vitamin B-12 02/11/2016 352  180 - 914 pg/mL Final   Comment: (NOTE)  This assay is not validated for testing neonatal or myeloproliferative syndrome specimens for Vitamin B12 levels. Performed at [facility]   . Folate 02/11/2016 8.4  >5.9 ng/mL Final  . Fecal Occult Bld 02/12/2016 NEGATIVE  NEGATIVE Final  . Creatinine, Urine 02/11/2016 22   Final  . Total Protein, Urine 02/11/2016 88   Final   NO NORMAL RANGE ESTABLISHED FOR THIS TEST  . Protein Creatinine Ratio 02/11/2016 4.00* 0.00 - 0.15 mg/mg[Cre] Final  .  TSH 02/11/2016 2.125  0.350 - 4.500 uIU/mL Final  . C3 Complement 02/11/2016 141  82 - 167 mg/dL Final   Comment: (NOTE) Performed At: [facility] [place], [place] [id number] Lindon Romp MD [id number]   . Complement C4, Body Fluid 02/11/2016 24  14 - 44 mg/dL Final   Comment: (NOTE) Performed At: [facility] [place], [place] [id number] Lindon Romp MD [id number]   . Hepatitis B Surface Ag 02/11/2016 Negative  Negative Final   Comment: (NOTE) Performed At: [facility] [place], [place] [id number] Lindon Romp MD [id number]   . HCV Ab 02/11/2016 <0.1  0.0 - 0.9 s/co ratio Final   Comment: (NOTE) Performed At: [facility] [place], [place] [id number] Lindon Romp MD YI:[id number]   . HIV Screen 4th Generation wRfx 02/11/2016 Non Reactive  Non Reactive Final   Comment: (NOTE) Performed At: [facility] [place], [place] [id number] Lindon Romp MD [id number]   . Hep B S Ab 02/11/2016 Non Reactive   Final   Comment: (NOTE)              Non Reactive: Inconsistent with immunity,                            less than 10 mIU/mL              Reactive:     Consistent with immunity,                            greater than 9.9 mIU/mL Performed At: [facility] [place], [place] [id number] Lindon Romp MD [id number]   . PTH 02/11/2016 217* 15 - 65 pg/mL Final   Comment: (NOTE) Performed At: [facility] [place], [place] [id number] Lindon Romp MD [id number]   . C-ANCA 02/11/2016 <1:20  Neg:<1:20 titer Final  . P-ANCA 02/11/2016 <1:20  Neg:<1:20 titer Final   Comment: (NOTE) The presence of positive fluorescence exhibiting P-ANCA or C-ANCA patterns alone is not specific for the diagnosis of Wegener's Granulomatosis (WG) or microscopic polyangiitis. Decisions about treatment  should not be based solely on ANCA IFA results.  The International ANCA Group Consensus recommends follow up testing of positive sera with both PR-3 and MPO-ANCA enzyme immunoassays. As many as 5% serum samples are positive only by EIA. Ref. AM J Clin Pathol 1999;111:507-513.   Marland Kitchen Atypical P-ANCA titer 02/11/2016 <1:20  Neg:<1:20 titer Final   Comment: (NOTE) The atypical pANCA pattern has been observed in a significant percentage of patients with ulcerative colitis, primary sclerosing cholangitis and autoimmune hepatitis. Performed At: [facility] [place], [place] [id number] Lindon Romp MD [id number]   . Myeloperoxidase Abs 02/11/2016 <9.0  0.0 - 9.0 U/mL Final  . ANCA Proteinase 3 02/11/2016 <3.5  0.0 -  3.5 U/mL Corrected   Comment: (NOTE) Performed At: [facility] [place], [place] [id number] Lindon Romp MD [id number]   . Hgb A1c MFr Bld 02/11/2016 5.9  4.0 - 6.0 % Final  . Sodium 02/12/2016 141  135 - 145 mmol/L Final  . Potassium 02/12/2016 3.6  3.5 - 5.1 mmol/L Final  . Chloride 02/12/2016 109  101 - 111 mmol/L Final  . CO2 02/12/2016 20* 22 - 32 mmol/L Final  . Glucose, Bld 02/12/2016 110* 65 - 99 mg/dL Final  . BUN 02/12/2016 34* 6 - 20 mg/dL Final  . Creatinine, Ser 02/12/2016 2.71* 0.44 - 1.00 mg/dL Final  . Calcium 02/12/2016 8.8* 8.9 - 10.3 mg/dL Final  . GFR calc non Af Amer 02/12/2016 22* >60 mL/min Final  . GFR calc Af Amer 02/12/2016 26* >60 mL/min Final   Comment: (NOTE) The eGFR has been calculated using the CKD EPI equation. This calculation has not been validated in all clinical situations. eGFR's persistently <60 mL/min signify possible Chronic Kidney Disease.   . Anion gap 02/12/2016 12  5 - 15 Final  . Anit Nuclear Antibody(ANA) 02/12/2016 Negative  Negative Final   Comment: (NOTE) Performed At: [facility] [place], [place] [id number] Lindon Romp MD  RS:[id number]   . Cholesterol 02/12/2016 89  0 - 200 mg/dL Final  . Triglycerides 02/12/2016 65  <150 mg/dL Final  . HDL 02/12/2016 30* >40 mg/dL Final  . Total CHOL/HDL Ratio 02/12/2016 3.0   Final  . VLDL 02/12/2016 13  0 - 40 mg/dL Final  . LDL Cholesterol 02/12/2016 46  0 - 99 mg/dL Final   Comment:        Total Cholesterol/HDL:CHD Risk Coronary Heart Disease Risk Table                     Men   Women  1/2 Average Risk   3.4   3.3  Average Risk       5.0   4.4  2 X Average Risk   9.6   7.1  3 X Average Risk  23.4   11.0        Use the calculated Patient Ratio above and the CHD Risk Table to determine the patient's CHD Risk.        ATP III CLASSIFICATION (LDL):  <100     mg/dL   Optimal  100-129  mg/dL   Near or Above                    Optimal  130-159  mg/dL   Borderline  160-189  mg/dL   High  >190     mg/dL   Very High   . Troponin I 02/12/2016 0.22* <0.031 ng/mL Final   Comment: PREVIOUS RESULT CALLED BY MSS @ 0938 ON 02/11/2016 CAF        PERSISTENTLY INCREASED TROPONIN VALUES IN THE RANGE OF 0.04-0.49 ng/mL CAN BE SEEN IN:       -UNSTABLE ANGINA       -CONGESTIVE HEART FAILURE       -MYOCARDITIS       -CHEST TRAUMA       -ARRYHTHMIAS       -LATE PRESENTING MYOCARDIAL INFARCTION       -COPD   CLINICAL FOLLOW-UP RECOMMENDED.   Marland Kitchen aPTT 02/12/2016 28  24 - 36 seconds Final  . Prothrombin Time 02/12/2016 15.0  11.4 - 15.0 seconds Final  .  INR 02/12/2016 1.16   Final  . Heparin Unfractionated 02/12/2016 <0.10* 0.30 - 0.70 IU/mL Final   Comment:        IF HEPARIN RESULTS ARE BELOW EXPECTED VALUES, AND PATIENT DOSAGE HAS BEEN CONFIRMED, SUGGEST FOLLOW UP TESTING OF ANTITHROMBIN III LEVELS.   . WBC 02/12/2016 14.5* 3.6 - 11.0 K/uL Final  . RBC 02/12/2016 4.72  3.80 - 5.20 MIL/uL Final  . Hemoglobin 02/12/2016 7.6* 12.0 - 16.0 g/dL Final   RESULT REPEATED AND VERIFIED  . HCT 02/12/2016 27.6* 35.0 - 47.0 % Final   RESULT REPEATED AND VERIFIED  . MCV  02/12/2016 58.4* 80.0 - 100.0 fL Final  . MCH 02/12/2016 16.1* 26.0 - 34.0 pg Final  . MCHC 02/12/2016 27.5* 32.0 - 36.0 g/dL Final  . RDW 02/12/2016 21.6* 11.5 - 14.5 % Final  . Platelets 02/12/2016 326  150 - 440 K/uL Final  . Neutrophils Relative % 02/12/2016 84   Final  . Lymphocytes Relative 02/12/2016 9   Final  . Monocytes Relative 02/12/2016 5   Final  . Eosinophils Relative 02/12/2016 1   Final  . Basophils Relative 02/12/2016 1   Final  . Neutro Abs 02/12/2016 12.3* 1.4 - 6.5 K/uL Final  . Lymphs Abs 02/12/2016 1.3  1.0 - 3.6 K/uL Final  . Monocytes Absolute 02/12/2016 0.7  0.2 - 0.9 K/uL Final  . Eosinophils Absolute 02/12/2016 0.1  0 - 0.7 K/uL Final  . Basophils Absolute 02/12/2016 0.1  0 - 0.1 K/uL Final  . Heparin Unfractionated 02/12/2016 0.20* 0.30 - 0.70 IU/mL Final   Comment:        IF HEPARIN RESULTS ARE BELOW EXPECTED VALUES, AND PATIENT DOSAGE HAS BEEN CONFIRMED, SUGGEST FOLLOW UP TESTING OF ANTITHROMBIN III LEVELS.   . Comment: 02/11/2016 Comment   Final   Comment: (NOTE) Non reactive HCV antibody screen is consistent with no HCV infection, unless recent infection is suspected or other evidence exists to indicate HCV infection. Performed At: [facility] [place], [place] [id number] Lindon Romp MD [id number]   . Sodium 02/13/2016 140  135 - 145 mmol/L Final  . Potassium 02/13/2016 3.8  3.5 - 5.1 mmol/L Final  . Chloride 02/13/2016 108  101 - 111 mmol/L Final  . CO2 02/13/2016 25  22 - 32 mmol/L Final  . Glucose, Bld 02/13/2016 101* 65 - 99 mg/dL Final  . BUN 02/13/2016 37* 6 - 20 mg/dL Final  . Creatinine, Ser 02/13/2016 2.93* 0.44 - 1.00 mg/dL Final  . Calcium 02/13/2016 8.7* 8.9 - 10.3 mg/dL Final  . GFR calc non Af Amer 02/13/2016 20* >60 mL/min Final  . GFR calc Af Amer 02/13/2016 24* >60 mL/min Final   Comment: (NOTE) The eGFR has been calculated using the CKD EPI equation. This calculation has not been validated in  all clinical situations. eGFR's persistently <60 mL/min signify possible Chronic Kidney Disease.   . Anion gap 02/13/2016 7  5 - 15 Final  . WBC 02/13/2016 11.2* 3.6 - 11.0 K/uL Final  . RBC 02/13/2016 4.48  3.80 - 5.20 MIL/uL Final  . Hemoglobin 02/13/2016 7.4* 12.0 - 16.0 g/dL Final   RESULT REPEATED AND VERIFIED  . HCT 02/13/2016 26.4* 35.0 - 47.0 % Final  . MCV 02/13/2016 59.0* 80.0 - 100.0 fL Final  . MCH 02/13/2016 16.4* 26.0 - 34.0 pg Final  . MCHC 02/13/2016 27.9* 32.0 - 36.0 g/dL Final  . RDW 02/13/2016 20.8* 11.5 - 14.5 % Final  . Platelets  02/13/2016 289  150 - 440 K/uL Final  . Heparin Unfractionated 02/13/2016 0.27* 0.30 - 0.70 IU/mL Final   Comment:        IF HEPARIN RESULTS ARE BELOW EXPECTED VALUES, AND PATIENT DOSAGE HAS BEEN CONFIRMED, SUGGEST FOLLOW UP TESTING OF ANTITHROMBIN III LEVELS.   Marland Kitchen Vitamin B-12 02/13/2016 305  180 - 914 pg/mL Final   Comment: (NOTE) This assay is not validated for testing neonatal or myeloproliferative syndrome specimens for Vitamin B12 levels. Performed at [facility]   . Folate 02/13/2016 6.8  >5.9 ng/mL Final  . Iron 02/13/2016 163  28 - 170 ug/dL Final  . TIBC 02/13/2016 384  250 - 450 ug/dL Final  . Saturation Ratios 02/13/2016 43* 10.4 - 31.8 % Final  . UIBC 02/13/2016 221   Final  . Ferritin 02/13/2016 45  11 - 307 ng/mL Final  . Retic Ct Pct 02/13/2016 3.2* 0.4 - 3.1 % Final  . RBC. 02/13/2016 4.56  3.80 - 5.20 MIL/uL Final  . Retic Count, Manual 02/13/2016 145.9  19.0 - 183.0 K/uL Final  . Order Confirmation 02/13/2016 ORDER PROCESSED BY BLOOD BANK   Final  . ABO/RH(D) 02/13/2016 A POS   Final  . Antibody Screen 02/13/2016 NEG   Final  . Sample Expiration 02/13/2016 02/16/2016   Final  . Unit Number 02/13/2016 [id number]   Final  . Blood Component Type 02/13/2016 RBC, LR IRR   Final  . Unit division 02/13/2016 00   Final  . Status of Unit 02/13/2016 ISSUED,FINAL   Final  . Transfusion Status 02/13/2016 OK  TO TRANSFUSE   Final  . Crossmatch Result 02/13/2016 Compatible   Final  . ABO/RH(D) 02/13/2016 A POS   Final  . Sodium 02/14/2016 139  135 - 145 mmol/L Final  . Potassium 02/14/2016 4.1  3.5 - 5.1 mmol/L Final  . Chloride 02/14/2016 107  101 - 111 mmol/L Final  . CO2 02/14/2016 25  22 - 32 mmol/L Final  . Glucose, Bld 02/14/2016 84  65 - 99 mg/dL Final  . BUN 02/14/2016 37* 6 - 20 mg/dL Final  . Creatinine, Ser 02/14/2016 2.94* 0.44 - 1.00 mg/dL Final  . Calcium 02/14/2016 8.5* 8.9 - 10.3 mg/dL Final  . GFR calc non Af Amer 02/14/2016 20* >60 mL/min Final  . GFR calc Af Amer 02/14/2016 24* >60 mL/min Final   Comment: (NOTE) The eGFR has been calculated using the CKD EPI equation. This calculation has not been validated in all clinical situations. eGFR's persistently <60 mL/min signify possible Chronic Kidney Disease.   . Anion gap 02/14/2016 7  5 - 15 Final  . Hemoglobin 02/14/2016 7.7* 12.0 - 16.0 g/dL Final   RESULT REPEATED AND VERIFIED  . HCT 02/14/2016 27.4* 35.0 - 47.0 % Final   RESULT REPEATED AND VERIFIED    STUDIES: Dg Chest 2 View  02/11/2016  CLINICAL DATA:  LEFT leg swelling and drainage for 1 week. Diagnosed with cellulitis. History of hypertension. EXAM: CHEST  2 VIEW COMPARISON:  None available for comparison at time of study interpretation. FINDINGS: The cardiac silhouette is moderately enlarged, mediastinal silhouette is nonsuspicious. Pulmonary vascular congestion without pleural effusion or focal consolidation. No pneumothorax. Large body habitus. Osseous structures are nonsuspicious. IMPRESSION: Moderate cardiomegaly and pulmonary vascular congestion. Electronically Signed   By: Elon Alas M.D.   On: 02/11/2016 02:33   US Renal  02/11/2016  CLINICAL DATA:  Acute renal failure EXAM: RENAL / URINARY TRACT ULTRASOUND COMPLETE COMPARISON:  None  in PACs FINDINGS: Right Kidney: Length: 12.1 cm. The renal cortical echotexture is increased and greater than that of  the adjacent liver. There is no hydronephrosis nor discrete mass. Left Kidney: Length: 10.6 cm. The cortical echotexture is increased. There is no hydronephrosis. There is no discrete mass. Bladder: The partially distended urinary bladder is normal in appearance. Bilateral ureteral jets are observed. IMPRESSION: Increased renal cortical echotexture consistent with medical renal disease. There is no evidence of obstruction. Electronically Signed   By: David  Martinique M.D.   On: 02/11/2016 15:46    ASSESSMENT:  Iron deficiency anemia.  PLAN:   1. IDA. Most recent Hgb in 2015 was wnl. In the setting of acute on chronic renal failure, CHF, as well as HTN. Heavy menses may also be a contributing factor. Patient was ordered feraheme this morning which she has received. She also received 1 unit of pRBCs yesterday. Patient will require follow up as an outpatient in 1 week for another feraheme infusion. Follow up should be made with Dr. Mike Gip at time of discharge.   Patient expressed understanding and was in agreement with this plan. She also understands that She can call clinic at any time with any questions, concerns, or complaints.   We will continue follow through this admission. Thank you for the opportunity to provide care to [name]. Dr. Grayland Ormond was available for consultation and review of plan of care for this patient.   Evlyn Kanner, NP   02/14/2016 2:27 PM

## 2016-02-15 ENCOUNTER — Inpatient Hospital Stay: Payer: Managed Care, Other (non HMO)

## 2016-02-15 DIAGNOSIS — N289 Disorder of kidney and ureter, unspecified: Secondary | ICD-10-CM

## 2016-02-15 DIAGNOSIS — D631 Anemia in chronic kidney disease: Secondary | ICD-10-CM

## 2016-02-15 LAB — PROTEIN ELECTROPHORESIS, SERUM
A/G Ratio: 0.9 (ref 0.7–1.7)
ALPHA-1-GLOBULIN: 0.3 g/dL (ref 0.0–0.4)
ALPHA-2-GLOBULIN: 0.9 g/dL (ref 0.4–1.0)
Albumin ELP: 3.5 g/dL (ref 2.9–4.4)
BETA GLOBULIN: 1.1 g/dL (ref 0.7–1.3)
GAMMA GLOBULIN: 1.4 g/dL (ref 0.4–1.8)
Globulin, Total: 3.7 g/dL (ref 2.2–3.9)
Total Protein ELP: 7.2 g/dL (ref 6.0–8.5)

## 2016-02-15 LAB — PROTEIN ELECTRO, RANDOM URINE
ALBUMIN ELP UR: 59.8 %
Alpha-1-Globulin, U: 2.9 %
Alpha-2-Globulin, U: 6.4 %
BETA GLOBULIN, U: 12.5 %
GAMMA GLOBULIN, U: 18.4 %
Total Protein, Urine: 352.5 mg/dL

## 2016-02-15 IMAGING — CT CT BIOPSY
3 series · 14 of 32 positions shown, 19 images · non-contrast
Comparison: none

INDICATION: Nephrotic syndrome with need for renal biopsy

[Series 2: routine abdomen · axial · 0.93mm/px · z∈[-720,-546]mm · 7 of 47 slices shown, 12 images (1 of 2)]
[im 6/47  soft-tissue]
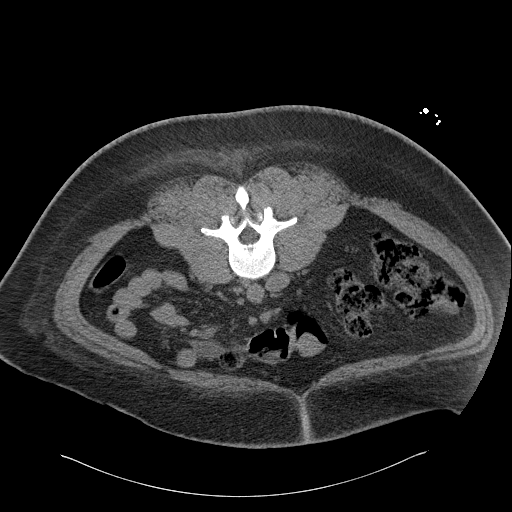
[im 6/47  bone]
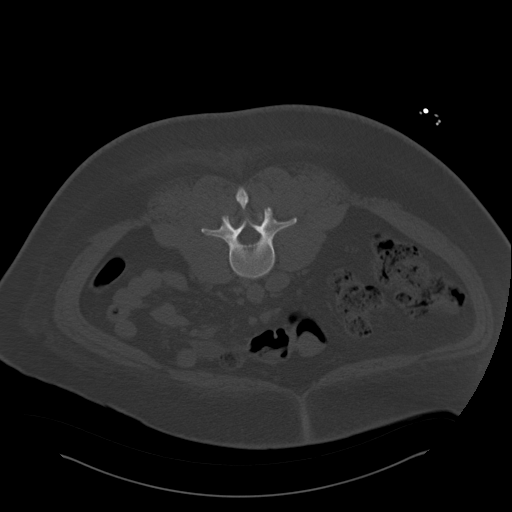
[im 12/47  soft-tissue]
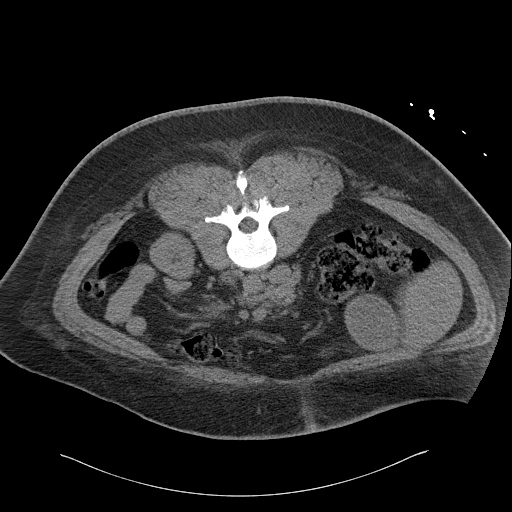
[im 18/47  soft-tissue]
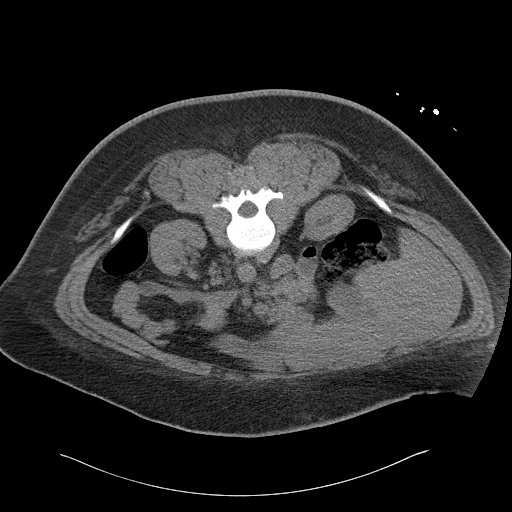
[im 24/47  soft-tissue]
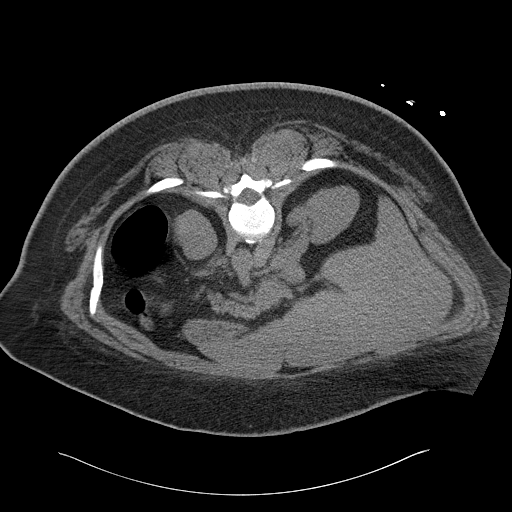
[im 24/47  lung]
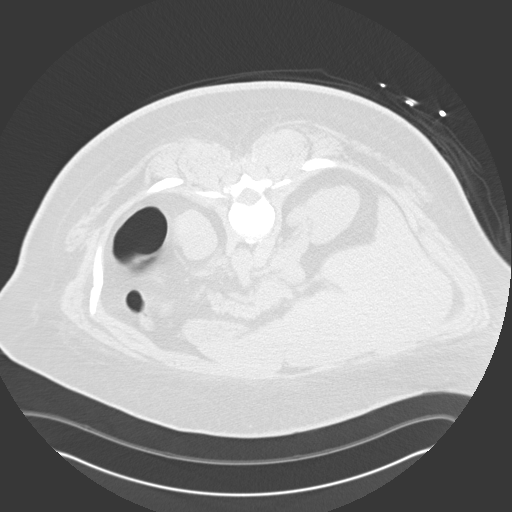
[im 29/47  soft-tissue]
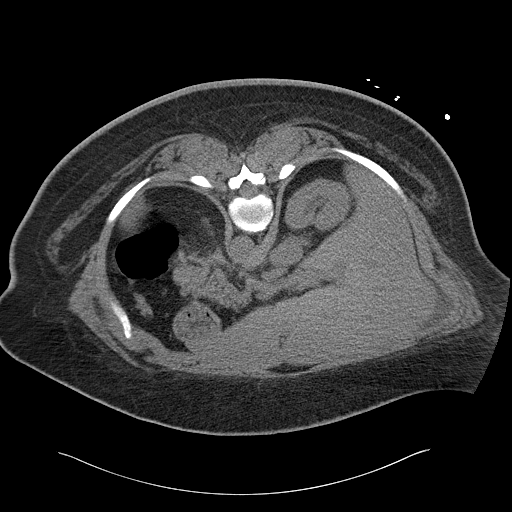
[im 29/47  lung]
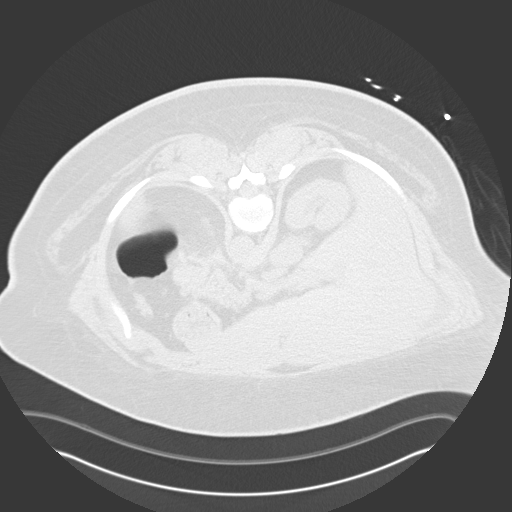
[im 35/47  soft-tissue]
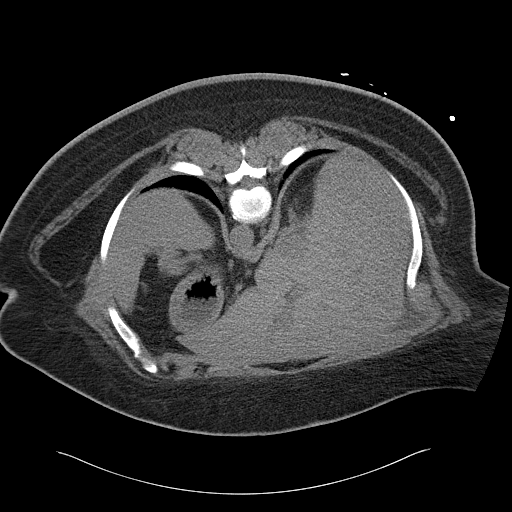
[im 35/47  lung]
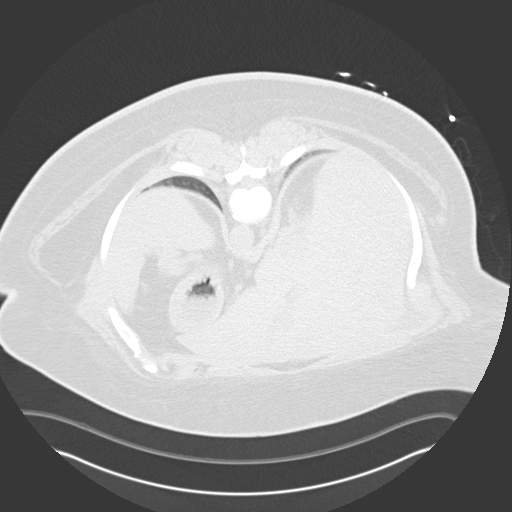
[im 41/47  soft-tissue]
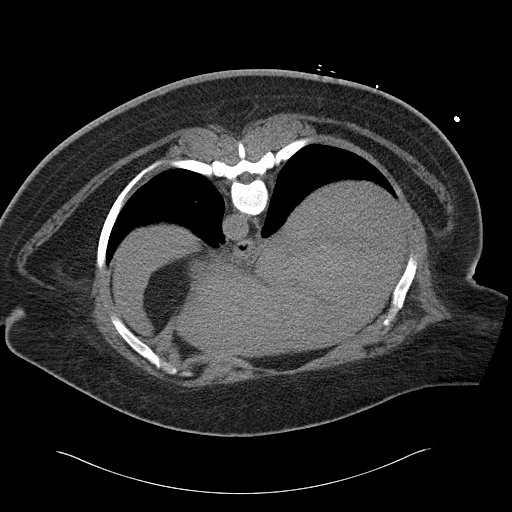
[im 41/47  lung]
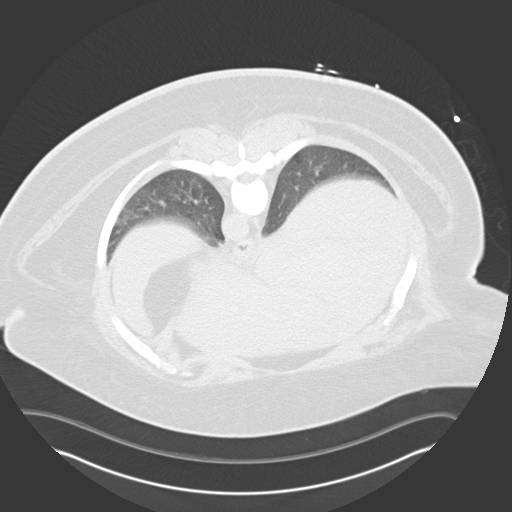

[Series 3: biopsy 2.4 b30s · axial · 0.74mm/px · z∈[-702,-697]mm · 4 of 96 slices shown]
[im 12/96  soft-tissue]
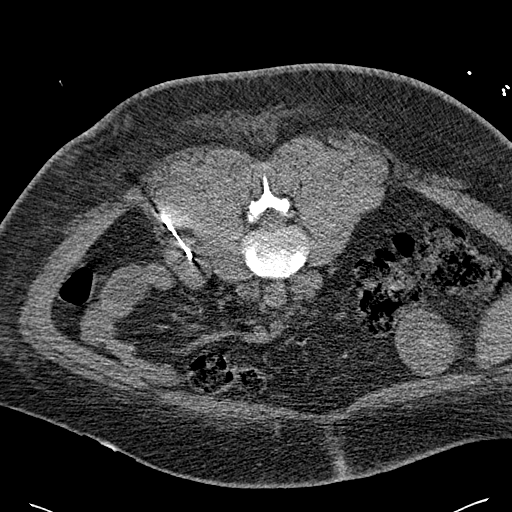
[im 23/96  soft-tissue]
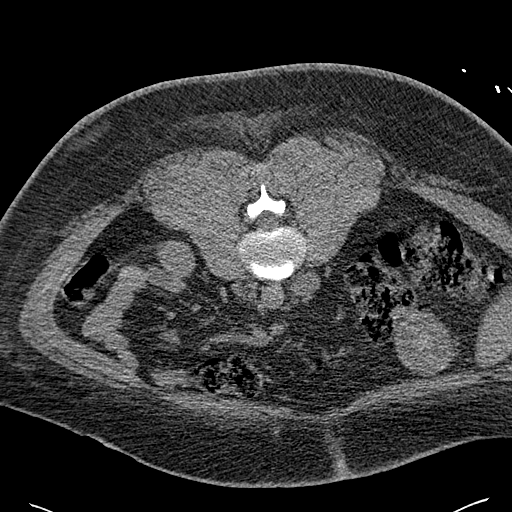
[im 34/96  soft-tissue]
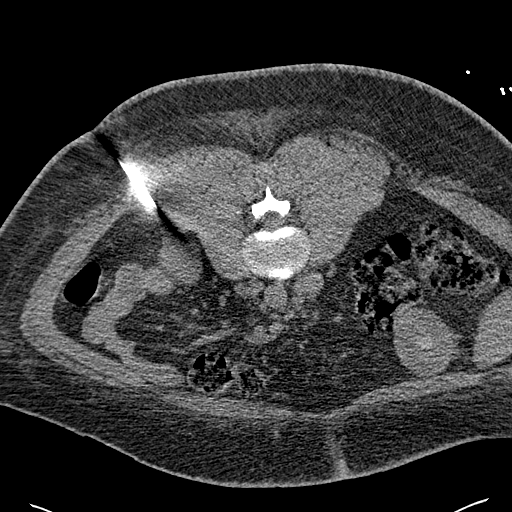
[im 45/96  soft-tissue]
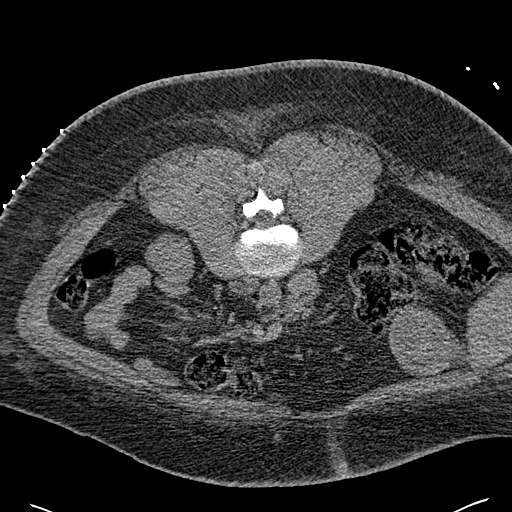

[Series 4: routine abdomen · axial · 0.93mm/px · z∈[-707,-642]mm · 3 of 27 slices shown (2 of 2)]
[im 7/27  soft-tissue]
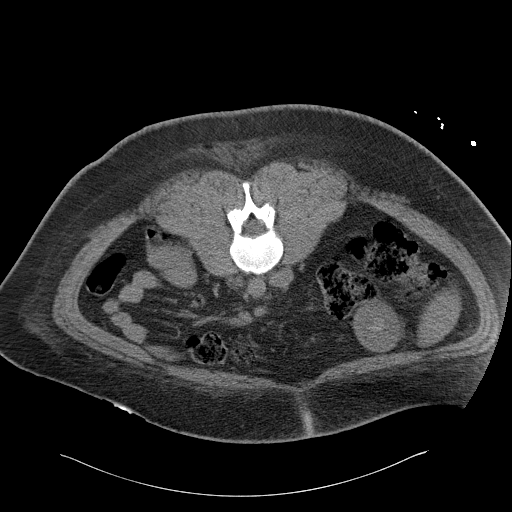
[im 14/27  soft-tissue]
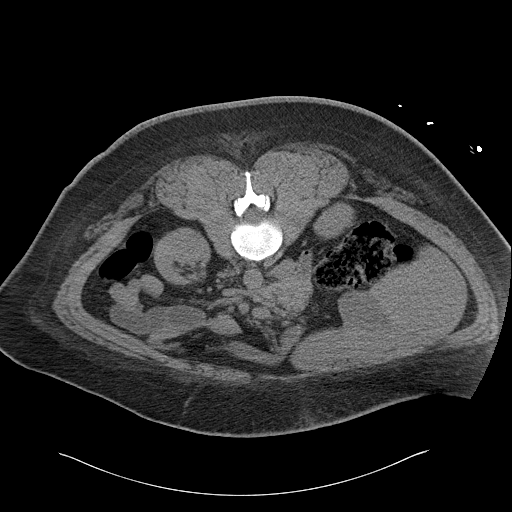
[im 20/27  soft-tissue]
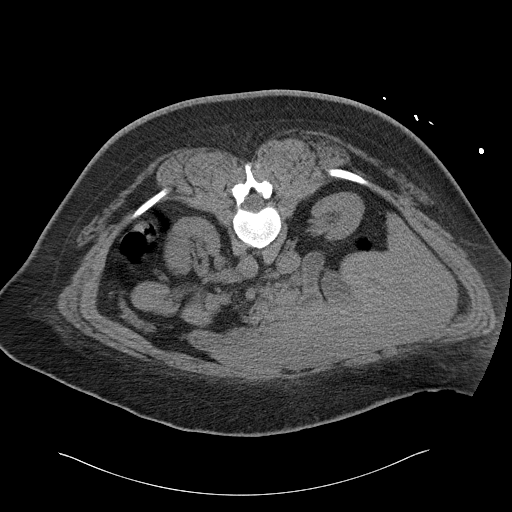

[14 of 32 positions shown; findings below may reference images not displayed]

EXAM:
CT RENAL BIOPSY

MEDICATIONS:
None.

ANESTHESIA/SEDATION:
Moderate (conscious) sedation was employed during this procedure. A
total of Versed 1 mg was administered intravenously.

Moderate Sedation Time: 40 minutes. The patient's level of
consciousness and vital signs were monitored continuously by
radiology nursing throughout the procedure under my direct
supervision.

FLUOROSCOPY TIME:  Not applicable

COMPLICATIONS:
None immediate.

PROCEDURE:
Informed written consent was obtained from the patient after a
thorough discussion of the procedural risks, benefits and
alternatives. All questions were addressed. Maximal Sterile Barrier
Technique was utilized including caps, mask, sterile gowns, sterile
gloves, sterile drape, hand hygiene and skin antiseptic. A timeout
was performed prior to the initiation of the procedure.

Initial CT scanning was performed and the inferior pole of the left
kidney was localized. An area was selected for subsequent biopsy
needle placement. Initial local anesthesia was not given utilizing
0.25% bupivacaine. Subsequently deep anesthesia was administered
utilizing bupivacaine as well to the margin of the lower pole of the
left kidney. Utilizing CT fluoroscopic guidance, a 15 gauge guiding
needle was placed adjacent to the lower pole of the left kidney.
Multiple 16 gauge core biopsies were then obtained. These were sent
for pathologic evaluation and deemed adequate. The guiding needle
was then removed following placement of Gel-Foam slurry surrounding
the lower pole of the left kidney. Followup scanning shows no
significant hematoma. The patient tolerated the procedure well was
returned to her room in satisfactory condition.
IMPRESSION: Successful CT-guided left random renal biopsy.

## 2016-02-15 MED ORDER — BUPIVACAINE HCL (PF) 0.25 % IJ SOLN
INTRAMUSCULAR | Status: AC
  Filled 2016-02-15: qty 30

## 2016-02-15 MED ORDER — MIDAZOLAM HCL 5 MG/5ML IJ SOLN
INTRAMUSCULAR | Status: AC
  Filled 2016-02-15: qty 5

## 2016-02-15 MED ORDER — ASPIRIN EC 81 MG PO TBEC
81.0000 mg | DELAYED_RELEASE_TABLET | Freq: Every day | ORAL | Status: DC
  Administered 2016-02-16: 81 mg via ORAL
  Filled 2016-02-15: qty 1

## 2016-02-15 MED ORDER — BUPIVACAINE HCL (PF) 0.25 % IJ SOLN
INTRAMUSCULAR | Status: AC | PRN
  Administered 2016-02-15: 30 mL

## 2016-02-15 MED ORDER — HEPARIN SODIUM (PORCINE) 5000 UNIT/ML IJ SOLN
5000.0000 [IU] | Freq: Three times a day (TID) | INTRAMUSCULAR | Status: DC
  Administered 2016-02-16: 5000 [IU] via SUBCUTANEOUS
  Filled 2016-02-15: qty 1

## 2016-02-15 MED ORDER — MIDAZOLAM HCL 5 MG/5ML IJ SOLN
INTRAMUSCULAR | Status: AC | PRN
Start: 2016-02-15 — End: 2016-02-15
  Administered 2016-02-15: 1 mg via INTRAVENOUS

## 2016-02-15 MED ORDER — FUROSEMIDE 40 MG PO TABS
40.0000 mg | ORAL_TABLET | Freq: Two times a day (BID) | ORAL | Status: DC
  Administered 2016-02-15 – 2016-02-16 (×2): 40 mg via ORAL
  Filled 2016-02-15 (×2): qty 1

## 2016-02-15 MED ORDER — SODIUM CHLORIDE 0.9 % IV SOLN
INTRAVENOUS | Status: DC
  Administered 2016-02-15: 08:00:00 via INTRAVENOUS

## 2016-02-15 MED ORDER — FENTANYL CITRATE (PF) 100 MCG/2ML IJ SOLN
INTRAMUSCULAR | Status: AC
  Filled 2016-02-15: qty 4

## 2016-02-15 NOTE — Procedures (Signed)
CT guided left renal biopsy  Complications:  None  Blood Loss: none  See dictation in canopy pacs

## 2016-02-15 NOTE — Consult Note (Signed)
GYNECOLOGY INPATIENT CONSULT NOTE FOLLOW UP  Subjective: Patient reports feeling sore, but otherwise ok.  Had renal biopsy this morning. Notes receiving Depo Provera injection overnight with no adverse reactions.   Objective: I have reviewed patient's vital signs and intake and output.  Filed Vitals:   02/15/16 1016 02/15/16 1022 02/15/16 1055 02/15/16 1114  BP: 167/93 173/91 142/100 136/86  Pulse: 84 86  83  Temp:      TempSrc:      Resp: 30 30    Height:      Weight:      SpO2: 100% 100%      General: alert and no distress, morbidly obese Resp: clear to auscultation bilaterally Cardio: regular rate and rhythm, S1, S2 normal, no murmur, click, rub or gallop GI: soft, non-tender; bowel sounds normal; no masses,  no organomegaly.  Extremities: extremities normal, atraumatic, no cyanosis or edema Vaginal Bleeding: minimal   Labs:  CBC Latest Ref Rng 02/14/2016 02/13/2016 02/12/2016  WBC 3.6 - 11.0 K/uL - 11.2(H) 14.5(H)  Hemoglobin 12.0 - 16.0 g/dL 7.7(L) 7.4(L) 7.6(L)  Hematocrit 35.0 - 47.0 % 27.4(L) 26.4(L) 27.6(L)  Platelets 150 - 440 K/uL - 289 326      Imaging (02/14/2016):  EXAM: TRANSABDOMINAL AND TRANSVAGINAL ULTRASOUND OF PELVIS COMPARISON: 12/31/2013  FINDINGS: Uterus  Measurements: 7.8 x 3.8 x 4.6 cm. No fibroids or other mass visualized.  Endometrium  Thickness: 4 mm. No focal abnormality visualized.  Right ovary  Measurements: 4.3 x 3.4 x 2.7 cm. Normal appearance/no adnexal mass.  Left ovary  Measurements: 3.3 x 1.6 x 1.9 cm. Normal appearance/no adnexal mass.  Other findings  Small volume pelvic ascites.  IMPRESSION: Negative pelvic ultrasound.  Assessment/Plan: 1. Menorrhagia - Reviewed pelvic ultrasound results with patient.   No fibroids or other masses noted. Relatively normal sono.  - Patient received Depo Provera overnight.Menses started today, light. Will be following up in office q 3 months for injections.   2.  Anemia, iron deficiency - S/p 1 unit PRBCs this admission - Is scheduled to see Hematologist outpatient for weekly iron infusions.  - Hopefully some improvement will also be seen with management of heavy menses with Depo Provera.   3. Multiple comorbidities (including acute CHF, acute on chronic renal failure, HTN) - Being managed by Hospitalist team. Patient s/p undergo kidney biopsy this morning.  Is scheduled to d/c home possibly tomorrow.     Thank you for the consult. Will sign off. Patient will need to have f/u in my office in the next 2-3 weeks.      LOS: 4 days    Rubie Maid, MD [facility] [phone] )(office) [phone] (pager)

## 2016-02-15 NOTE — Progress Notes (Signed)
Patient back from Renal Biopsy. Site is clean dry and intact no signs of bleeding noted. Patient is aware that she will be on bedrest for the next four hours. Will continue to monitor site.

## 2016-02-15 NOTE — Progress Notes (Signed)
Subjective:  Recent status post CT-guided renal biopsytoday. She appears have tolerated this quite well. Her mother is currently at bedside as well.    Objective:  Vital signs in last 24 hours:  Temp:  [98.2 F (36.8 C)-98.3 F (36.8 C)] 98.2 F (36.8 C) (05/01 0350) Pulse Rate:  [80-86] 80 (05/01 1656) Resp:  [18-35] 30 (05/01 1022) BP: (115-173)/(75-100) 115/75 mmHg (05/01 1656) SpO2:  [98 %-100 %] 100 % (05/01 1022) Weight:  [136.397 kg (300 lb 11.2 oz)] 136.397 kg (300 lb 11.2 oz) (05/01 0522)  Weight change: -1.134 kg (-2 lb 8 oz) Filed Weights   02/13/16 0453 02/14/16 0432 02/15/16 0522  Weight: 136.76 kg (301 lb 8 oz) 137.531 kg (303 lb 3.2 oz) 136.397 kg (300 lb 11.2 oz)    Intake/Output:    Intake/Output Summary (Last 24 hours) at 02/15/16 1759 Last data filed at 02/15/16 1343  Gross per 24 hour  Intake    240 ml  Output    300 ml  Net    -60 ml     Physical Exam: General: No acute distress, laying flat in bed  HEENT Pale conjunctiva, moist oral mucous membranes  Neck supple  Pulm/lungs normal effort,clear to auscultation bilaterally  CVS/Heart S1S2 no rubs  Abdomen:  Soft, nontender, nondistended  Extremities: 2+ pitting edema bilaterally; Left > Right  Neurologic: Alert, oriented, follows commands  Skin: No acute rashes          Basic Metabolic Panel:   Recent Labs Lab 02/11/16 0155 02/12/16 0449 02/13/16 0701 02/14/16 0532  NA 138 141 140 139  K 3.7 3.6 3.8 4.1  CL 110 109 108 107  CO2 20* 20* 25 25  GLUCOSE 116* 110* 101* 84  BUN 39* 34* 37* 37*  CREATININE 3.08* 2.71* 2.93* 2.94*  CALCIUM 8.3* 8.8* 8.7* 8.5*     CBC:  Recent Labs Lab 02/11/16 0155 02/12/16 0449 02/13/16 0701 02/14/16 0532  WBC 12.9* 14.5* 11.2*  --   NEUTROABS  --  12.3*  --   --   HGB 7.1* 7.6* 7.4* 7.7*  HCT 26.1* 27.6* 26.4* 27.4*  MCV 59.4* 58.4* 59.0*  --   PLT 292 326 289  --       Microbiology:  No results found for this or any previous  visit (from the past 720 hour(s)).  Coagulation Studies: No results for input(s): LABPROT, INR in the last 72 hours.  Urinalysis: No results for input(s): COLORURINE, LABSPEC, PHURINE, GLUCOSEU, HGBUR, BILIRUBINUR, KETONESUR, PROTEINUR, UROBILINOGEN, NITRITE, LEUKOCYTESUR in the last 72 hours.  Invalid input(s): APPERANCEUR    Imaging: US Transvaginal Non-ob  02/14/2016  CLINICAL DATA:  Menorrhagia x2 years EXAM: TRANSABDOMINAL AND TRANSVAGINAL ULTRASOUND OF PELVIS TECHNIQUE: Both transabdominal and transvaginal ultrasound examinations of the pelvis were performed. Transabdominal technique was performed for global imaging of the pelvis including uterus, ovaries, adnexal regions, and pelvic cul-de-sac. It was necessary to proceed with endovaginal exam following the transabdominal exam to visualize the endometrium. COMPARISON:  12/31/2013 FINDINGS: Uterus Measurements: 7.8 x 3.8 x 4.6 cm. No fibroids or other mass visualized. Endometrium Thickness: 4 mm.  No focal abnormality visualized. Right ovary Measurements: 4.3 x 3.4 x 2.7 cm. Normal appearance/no adnexal mass. Left ovary Measurements: 3.3 x 1.6 x 1.9 cm. Normal appearance/no adnexal mass. Other findings Small volume pelvic ascites. IMPRESSION: Negative pelvic ultrasound. Electronically Signed   By: Julian Hy M.D.   On: 02/14/2016 18:28   US Pelvis Complete  02/14/2016  CLINICAL DATA:  Menorrhagia x2 years EXAM: TRANSABDOMINAL AND TRANSVAGINAL ULTRASOUND OF PELVIS TECHNIQUE: Both transabdominal and transvaginal ultrasound examinations of the pelvis were performed. Transabdominal technique was performed for global imaging of the pelvis including uterus, ovaries, adnexal regions, and pelvic cul-de-sac. It was necessary to proceed with endovaginal exam following the transabdominal exam to visualize the endometrium. COMPARISON:  12/31/2013 FINDINGS: Uterus Measurements: 7.8 x 3.8 x 4.6 cm. No fibroids or other mass visualized. Endometrium  Thickness: 4 mm.  No focal abnormality visualized. Right ovary Measurements: 4.3 x 3.4 x 2.7 cm. Normal appearance/no adnexal mass. Left ovary Measurements: 3.3 x 1.6 x 1.9 cm. Normal appearance/no adnexal mass. Other findings Small volume pelvic ascites. IMPRESSION: Negative pelvic ultrasound. Electronically Signed   By: Julian Hy M.D.   On: 02/14/2016 18:28   Ct Biopsy  02/15/2016  INDICATION: Nephrotic syndrome with need for renal biopsy EXAM: CT RENAL BIOPSY MEDICATIONS: None. ANESTHESIA/SEDATION: Moderate (conscious) sedation was employed during this procedure. A total of Versed 1 mg was administered intravenously. Moderate Sedation Time: 40 minutes. The patient's level of consciousness and vital signs were monitored continuously by radiology nursing throughout the procedure under my direct supervision. FLUOROSCOPY TIME:  Not applicable COMPLICATIONS: None immediate. PROCEDURE: Informed written consent was obtained from the patient after a thorough discussion of the procedural risks, benefits and alternatives. All questions were addressed. Maximal Sterile Barrier Technique was utilized including caps, mask, sterile gowns, sterile gloves, sterile drape, hand hygiene and skin antiseptic. A timeout was performed prior to the initiation of the procedure. Initial CT scanning was performed and the inferior pole of the left kidney was localized. An area was selected for subsequent biopsy needle placement. Initial local anesthesia was not given utilizing 0.25% bupivacaine. Subsequently deep anesthesia was administered utilizing bupivacaine as well to the margin of the lower pole of the left kidney. Utilizing CT fluoroscopic guidance, a 15 gauge guiding needle was placed adjacent to the lower pole of the left kidney. Multiple 16 gauge core biopsies were then obtained. These were sent for pathologic evaluation and deemed adequate. The guiding needle was then removed following placement of Gel-Foam slurry  surrounding the lower pole of the left kidney. Followup scanning shows no significant hematoma. The patient tolerated the procedure well was returned to her room in satisfactory condition. IMPRESSION: Successful CT-guided left random renal biopsy. Electronically Signed   By: Inez Catalina M.D.   On: 02/15/2016 11:56     Medications:   . sodium chloride 10 mL/hr at 02/15/16 0814   . [START ON 02/16/2016] aspirin EC  81 mg Oral Daily  . carvedilol  25 mg Oral BID WC  . cloNIDine  0.1 mg Oral BID  . furosemide  40 mg Oral BID  . [START ON 02/16/2016] heparin subcutaneous  5,000 Units Subcutaneous Q8H  . isosorbide mononitrate  30 mg Oral BID  . sodium chloride flush  3 mL Intravenous Q12H   sodium chloride, acetaminophen, hydrALAZINE, ondansetron (ZOFRAN) IV, oxyCODONE-acetaminophen, sodium chloride flush, sodium chloride flush  Assessment/ Plan:  30 y.o. female with morbid obesity, malignant hypertension, asthma, chronic kidney disease  1.  Acute renal failure on chronic kidney disease stage III.  Baseline creatinine unknown but it was 2.12 in March 2015 with GFR of 36.  Patient also has nephrotic range proteinuria. Cholesterol is not elevated.  Hemoglobin A1c is 5.9%. Differential diagnosis includes FSGS-primary versus secondary due to morbid obesity Patient underwent CT guided renal biopsy 02/15/16.   -  Await biopsy results, follow renal function trend  daily for now.  2. Severe iron deficiency anemia - Feraheme administration performed, monitor hgb.   3.  Anasarca 2-D echo - EF 50-55% Continue Lasix 40 mg by mouth twice a day.  4. Hypertension.  Blood pressure 115/75.  Continue coreg, clonidine.  5. Nephrotic range proteinuria - s/p renal biopsy today, awaiting result.    LOS: 4 [name] 5/1/20175:59 PM

## 2016-02-15 NOTE — Progress Notes (Addendum)
Patient ID: [name], female   DOB: 02-06-1986, 30 y.o.   MRN: [id number] [facility] [place] NAME: [name]    MR#:  [id number]  DATE OF BIRTH:  May 19, 1986  SUBJECTIVE:  No complaints Status post kidney biopsy today.  REVIEW OF SYSTEMS:   Review of Systems  Constitutional: Negative for fever, chills and weight loss.  HENT: Negative for ear discharge, ear pain and nosebleeds.   Eyes: Negative for blurred vision, pain and discharge.  Respiratory: Negative for shortness of breath, wheezing and stridor.   Cardiovascular: Positive for leg swelling. Negative for chest pain, palpitations, orthopnea and PND.  Gastrointestinal: Negative for nausea, vomiting, abdominal pain and diarrhea.  Genitourinary: Negative for urgency and frequency.  Musculoskeletal: Negative for back pain and joint pain.  Neurological: Positive for weakness. Negative for sensory change, speech change and focal weakness.  Psychiatric/Behavioral: Negative for depression and hallucinations. The patient is not nervous/anxious.   All other systems reviewed and are negative.  Tolerating Diet:yes Tolerating PT: not needed  DRUG ALLERGIES:  No Known Allergies  VITALS:  Blood pressure 136/86, pulse 83, temperature 98.2 F (36.8 C), temperature source Oral, resp. rate 30, height 5\' 6"  (1.676 m), weight 136.397 kg (300 lb 11.2 oz), last menstrual period 02/14/2016, SpO2 100 %.  PHYSICAL EXAMINATION:   Physical Exam  GENERAL:  30 y.o.-year-old patient lying in the bed with no acute distress. Morbid obese. EYES: Pupils equal, round, reactive to light and accommodation. No scleral icterus. Extraocular muscles intact.  HEENT: Head atraumatic, normocephalic. Oropharynx and nasopharynx clear.  NECK:  Supple, no jugular venous distention. No thyroid enlargement, no tenderness.  LUNGS: Normal breath sounds bilaterally, no wheezing, rales, rhonchi. No use of  accessory muscles of respiration.  CARDIOVASCULAR: S1, S2 normal. No murmurs, rubs, or gallops.  ABDOMEN: Soft, nontender, nondistended. Bowel sounds present. No organomegaly or mass.  EXTREMITIES: No cyanosis, clubbing , bilateral leg edema 1-2 +    NEUROLOGIC: Cranial nerves II through XII are intact. No focal Motor or sensory deficits b/l.   PSYCHIATRIC:  patient is alert and oriented x 3.  SKIN: No obvious rash, lesion, or ulcer.   LABORATORY PANEL:  CBC  Recent Labs Lab 02/13/16 0701 02/14/16 0532  WBC 11.2*  --   HGB 7.4* 7.7*  HCT 26.4* 27.4*  PLT 289  --     Chemistries   Recent Labs Lab 02/11/16 0155  02/14/16 0532  NA 138  < > 139  K 3.7  < > 4.1  CL 110  < > 107  CO2 20*  < > 25  GLUCOSE 116*  < > 84  BUN 39*  < > 37*  CREATININE 3.08*  < > 2.94*  CALCIUM 8.3*  < > 8.5*  AST 19  --   --   ALT 13*  --   --   ALKPHOS 67  --   --   BILITOT 0.9  --   --   < > = values in this interval not displayed. Cardiac Enzymes  Recent Labs Lab 02/12/16 0449  TROPONINI 0.22*   RADIOLOGY:  US Transvaginal Non-ob  02/14/2016  CLINICAL DATA:  Menorrhagia x2 years EXAM: TRANSABDOMINAL AND TRANSVAGINAL ULTRASOUND OF PELVIS TECHNIQUE: Both transabdominal and transvaginal ultrasound examinations of the pelvis were performed. Transabdominal technique was performed for global imaging of the pelvis including uterus, ovaries, adnexal regions, and pelvic cul-de-sac. It was necessary to proceed with endovaginal exam following  the transabdominal exam to visualize the endometrium. COMPARISON:  12/31/2013 FINDINGS: Uterus Measurements: 7.8 x 3.8 x 4.6 cm. No fibroids or other mass visualized. Endometrium Thickness: 4 mm.  No focal abnormality visualized. Right ovary Measurements: 4.3 x 3.4 x 2.7 cm. Normal appearance/no adnexal mass. Left ovary Measurements: 3.3 x 1.6 x 1.9 cm. Normal appearance/no adnexal mass. Other findings Small volume pelvic ascites. IMPRESSION: Negative pelvic  ultrasound. Electronically Signed   By: Julian Hy M.D.   On: 02/14/2016 18:28   US Pelvis Complete  02/14/2016  CLINICAL DATA:  Menorrhagia x2 years EXAM: TRANSABDOMINAL AND TRANSVAGINAL ULTRASOUND OF PELVIS TECHNIQUE: Both transabdominal and transvaginal ultrasound examinations of the pelvis were performed. Transabdominal technique was performed for global imaging of the pelvis including uterus, ovaries, adnexal regions, and pelvic cul-de-sac. It was necessary to proceed with endovaginal exam following the transabdominal exam to visualize the endometrium. COMPARISON:  12/31/2013 FINDINGS: Uterus Measurements: 7.8 x 3.8 x 4.6 cm. No fibroids or other mass visualized. Endometrium Thickness: 4 mm.  No focal abnormality visualized. Right ovary Measurements: 4.3 x 3.4 x 2.7 cm. Normal appearance/no adnexal mass. Left ovary Measurements: 3.3 x 1.6 x 1.9 cm. Normal appearance/no adnexal mass. Other findings Small volume pelvic ascites. IMPRESSION: Negative pelvic ultrasound. Electronically Signed   By: Julian Hy M.D.   On: 02/14/2016 18:28   Ct Biopsy  02/15/2016  INDICATION: Nephrotic syndrome with need for renal biopsy EXAM: CT RENAL BIOPSY MEDICATIONS: None. ANESTHESIA/SEDATION: Moderate (conscious) sedation was employed during this procedure. A total of Versed 1 mg was administered intravenously. Moderate Sedation Time: 40 minutes. The patient's level of consciousness and vital signs were monitored continuously by radiology nursing throughout the procedure under my direct supervision. FLUOROSCOPY TIME:  Not applicable COMPLICATIONS: None immediate. PROCEDURE: Informed written consent was obtained from the patient after a thorough discussion of the procedural risks, benefits and alternatives. All questions were addressed. Maximal Sterile Barrier Technique was utilized including caps, mask, sterile gowns, sterile gloves, sterile drape, hand hygiene and skin antiseptic. A timeout was performed prior  to the initiation of the procedure. Initial CT scanning was performed and the inferior pole of the left kidney was localized. An area was selected for subsequent biopsy needle placement. Initial local anesthesia was not given utilizing 0.25% bupivacaine. Subsequently deep anesthesia was administered utilizing bupivacaine as well to the margin of the lower pole of the left kidney. Utilizing CT fluoroscopic guidance, a 15 gauge guiding needle was placed adjacent to the lower pole of the left kidney. Multiple 16 gauge core biopsies were then obtained. These were sent for pathologic evaluation and deemed adequate. The guiding needle was then removed following placement of Gel-Foam slurry surrounding the lower pole of the left kidney. Followup scanning shows no significant hematoma. The patient tolerated the procedure well was returned to her room in satisfactory condition. IMPRESSION: Successful CT-guided left random renal biopsy. Electronically Signed   By: Inez Catalina M.D.   On: 02/15/2016 11:56   ASSESSMENT AND PLAN:  [name] is a 30 y.o. female with a known history of Hypertension, bronchial asthma, chronic kidney disease presented to the emergency room because she had swelling in both the legs. Patient says she has been retaining fluid and swelling in the legs for more than a week. She does not have any primary care physician. She also has a shortness of breath and which has been worsening for the last few days  1. Acute  Diastolic CHF :  She was treated wtih Lasix  which was discontinued. Resume lasix 40 mg bid per Dr. Candiss Norse. Echocardiogram EF: 50% - 55%.  2. Anemia due to iron deficiency last hemoglobin in March 2015 was normal Hemoglobin  - Severe iron deficiency.recieved dose of IV iron x2 -hematology to see pt- f/u with Dr Mike Gip for weekly IV ferraheme -s/p 1 unit BT on 4/29. Follow-up hemoglobin.   3. 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 Follow up pathology. May resume aspirin and heparin subcutaneous tomorrow.  4. Elevated troponin,  could be demand ischemia or acute renal failure. She was on heparin drip.   5. Miscellaneous heparin for DVT prophylaxis  6. Menorrorrahgia. She was treated with Depo-PROVERA. Follow-up with OB/GYN as outpatient.  7. Malignant emergent HTN BP was 216/113.Better controlled. She has been treated with Coreg 25mg  BID, Clonidine 0.1mg  BID, and Imdur 30mg  BID.  I discussed with Dr. Holley Raring and cardiologist. Case discussed with Care Management/Social Worker. Management plans discussed with the patient, her mother and they are in agreement.  CODE STATUS: full code.  TOTAL TIME TAKING CARE OF THIS PATIENT: 35 minutes.  >50% time spent on counselling and coordination of care.  POSSIBLE D/C IN 1-2 DAYS, DEPENDING ON CLINICAL CONDITION.  Note: This dictation was prepared with Dragon dictation along with smaller phrase technology. Any transcriptional errors that result from this process are unintentional.  Demetrios Loll M.D on 02/15/2016 at 1:40 PM  Between 7am to 6pm - Pager - [phone]  After 6pm go to [email or web address] - password [email or web address] [facility]  [facility]  Office  [phone]  CC: Primary care physician; No PCP Per Patient

## 2016-02-15 NOTE — Progress Notes (Signed)
Hospital Problem List     Principal Problem:   CHF (congestive heart failure) (HCC) Active Problems:   Fluid overload   Uncontrolled hypertension   Acute congestive heart failure (HCC)   Hypertensive urgency   Peripheral edema   Sinus tachycardia (HCC)   Acute on chronic renal failure (HCC)   Anemia due to other cause    Patient Profile:   Primary Cardiologist: Dr. Fletcher Anon  30 y.o. female w/ PMH of Stage 3 CKD, uncontrolled HTN, morbid obesity, and asthma who has not seen an MD in the outpatient setting in many years presented to [facility] on 4/26 with LE swelling for the past 2-3 years. Cardiology consulted for CHF exacerbation.  Subjective   Had renal biopsy this AM. Denies any orthopnea, PND, or current dyspnea. Reports lower extremity edema has significantly improved.   Inpatient Medications    . [START ON 02/16/2016] aspirin EC  81 mg Oral Daily  . carvedilol  25 mg Oral BID WC  . cloNIDine  0.1 mg Oral BID  . isosorbide mononitrate  30 mg Oral BID  . sodium chloride flush  3 mL Intravenous Q12H    Vital Signs    Filed Vitals:   02/15/16 1016 02/15/16 1022 02/15/16 1055 02/15/16 1114  BP: 167/93 173/91 142/100 136/86  Pulse: 84 86  83  Temp:      TempSrc:      Resp: 30 30    Height:      Weight:      SpO2: 100% 100%      Intake/Output Summary (Last 24 hours) at 02/15/16 1143 Last data filed at 02/15/16 0522  Gross per 24 hour  Intake    240 ml  Output    650 ml  Net   -410 ml   Filed Weights   02/13/16 0453 02/14/16 0432 02/15/16 0522  Weight: 301 lb 8 oz (136.76 kg) 303 lb 3.2 oz (137.531 kg) 300 lb 11.2 oz (136.397 kg)    Physical Exam    General: Obese African American  female appearing in no acute distress. Head: Normocephalic, atraumatic.  Neck: Supple without bruits, JVD not elevated. Lungs:  Resp regular and unlabored, CTA without wheezing or rales. Heart: RRR, S1, S2, no S3, S4, or murmur; no rub. Abdomen: Soft, non-tender, non-distended  with normoactive bowel sounds. No hepatomegaly. No rebound/guarding. No obvious abdominal masses. Dressing in place on back. Extremities: No clubbing, cyanosis, 1+ pitting edema on right, 2+ on left. Distal pedal pulses are 2+ bilaterally. Neuro: Alert and oriented X 3. Moves all extremities spontaneously. Psych: Normal affect.  Labs    CBC  Recent Labs  02/13/16 0701 02/14/16 0532  WBC 11.2*  --   HGB 7.4* 7.7*  HCT 26.4* 27.4*  MCV 59.0*  --   PLT 289  --    Basic Metabolic Panel  Recent Labs  02/13/16 0701 02/14/16 0532  NA 140 139  K 3.8 4.1  CL 108 107  CO2 25 25  GLUCOSE 101* 84  BUN 37* 37*  CREATININE 2.93* 2.94*  CALCIUM 8.7* 8.5*   Thyroid Function Tests  Recent Labs  02/14/16 1413  TSH 2.104    Telemetry    NSR, HR in 70's - 80's. No atopic events.  ECG    No new tracings.   Cardiac Studies and Radiology     US Renal: 02/11/2016  CLINICAL DATA:  Acute renal failure EXAM: RENAL / URINARY TRACT ULTRASOUND COMPLETE COMPARISON:  None in PACs  FINDINGS: Right Kidney: Length: 12.1 cm. The renal cortical echotexture is increased and greater than that of the adjacent liver. There is no hydronephrosis nor discrete mass. Left Kidney: Length: 10.6 cm. The cortical echotexture is increased. There is no hydronephrosis. There is no discrete mass. Bladder: The partially distended urinary bladder is normal in appearance. Bilateral ureteral jets are observed. IMPRESSION: Increased renal cortical echotexture consistent with medical renal disease. There is no evidence of obstruction. Electronically Signed   By: David  Martinique M.D.   On: 02/11/2016 15:46    Echocardiogram: 02/12/2016 Study Conclusions - Left ventricle: The cavity size was at the upper limits of  normal. There was mild concentric hypertrophy. Systolic function  was normal. The estimated ejection fraction was in the range of  50% to 55%. Wall motion was normal; there were no regional wall  motion  abnormalities. Doppler parameters are consistent with  abnormal left ventricular relaxation (grade 1 diastolic  dysfunction). - Left atrium: The atrium was mildly dilated. - Right ventricle: Systolic function was normal. - Pulmonary arteries: Systolic pressure was within the normal  range. - Pericardium, extracardiac: A small pericardial effusion was  identified.  Assessment & Plan    1. Acute on chronic diastolic CHF - Echo this admission shows a preserved EF of 50-55% with Grade 1 DD. - LE edema is multifactorial including diastolic CHF, hypoalbuminemia, and acute on chronic anemia.  - still has significant lower extremity edema on examination, lungs are clear and JVD does not appear elevated. - net output this admission is -6.7L. Nephrology recommended Lasix dosing of 40mg  BID.  - would continue BB. No ACE-I/ARB secondary to AKI.  2. Anemia - Hgb at 7.7 on 02/14/2016. - is being followed by OB/GYN for menorrhagia. Hematology recommended weekly IV Feraheme.  3. Malignant HTN/hypertensive emergency - presented with BP of 216/113. - BP has been 120/75 - 173/100 in the past 24 hours. - continue Coreg 25mg  BID, Clonidine 0.1mg  BID, and Imdur 30mg  BID.  4. 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 Limit nephrotoxic agents - Creatinine at 2.94 on 02/14/2016. - underwent left renal biopsy this AM.  - Nephrology following.   5. Sinus tachycardia - Possibly exacerbated by anemia - currently resolved, with HR remaining in the 80's. - continue BB.    6. Elevated Troponin - cyclic values this admission were 0.05, 0.20, and 0.22. Likely secondary to demand ischemia in the setting of hypertensive emergency and acute on chronic Stage 4 CKD. Will recheck with AM labs to make sure this is trending down. - was on IV Heparin, d/c'ed for procedure. Would not resume at this time. - EKG without acute ischemic changes and no recent anginal symptoms  Signed, Erma Heritage , PA-C 11:43 AM 02/15/2016 Pager: [phone]

## 2016-02-15 NOTE — Progress Notes (Signed)
Post IR Procedure Consult: Anticoagluation/Antiplatelet Med list review  Patient is s/p IR renal biopsy. Bleeding risk associated with procedure: standard.  Previously on aspirin 81mg  daily, discontinued yesterday for procedure.   Will resume aspirin 81 mg daily tomorrow.  Patient was also previously on heparin drip for NSTEMI vs. Demand ischemia. Heparin drip stopped 4/29. Discussed with Dr. Bridgett Larsson and Dr. Holley Raring. Will not plan to resume heparin after procedure.   Rexene Edison, PharmD Clinical Pharmacist  02/15/2016 11:07 AM

## 2016-02-16 DIAGNOSIS — N049 Nephrotic syndrome with unspecified morphologic changes: Secondary | ICD-10-CM | POA: Insufficient documentation

## 2016-02-16 DIAGNOSIS — N920 Excessive and frequent menstruation with regular cycle: Secondary | ICD-10-CM | POA: Insufficient documentation

## 2016-02-16 LAB — CBC
HCT: 30.5 % — ABNORMAL LOW (ref 35.0–47.0)
Hemoglobin: 8.7 g/dL — ABNORMAL LOW (ref 12.0–16.0)
MCH: 18.3 pg — ABNORMAL LOW (ref 26.0–34.0)
MCHC: 28.5 g/dL — ABNORMAL LOW (ref 32.0–36.0)
MCV: 64.2 fL — ABNORMAL LOW (ref 80.0–100.0)
PLATELETS: 297 10*3/uL (ref 150–440)
RBC: 4.75 MIL/uL (ref 3.80–5.20)
RDW: 23.1 % — ABNORMAL HIGH (ref 11.5–14.5)
WBC: 12.8 10*3/uL — AB (ref 3.6–11.0)

## 2016-02-16 LAB — BASIC METABOLIC PANEL
Anion gap: 9 (ref 5–15)
BUN: 35 mg/dL — ABNORMAL HIGH (ref 6–20)
CALCIUM: 8.9 mg/dL (ref 8.9–10.3)
CO2: 23 mmol/L (ref 22–32)
CREATININE: 2.94 mg/dL — AB (ref 0.44–1.00)
Chloride: 110 mmol/L (ref 101–111)
GFR, EST AFRICAN AMERICAN: 24 mL/min — AB (ref >60)
GFR, EST NON AFRICAN AMERICAN: 20 mL/min — AB (ref >60)
Glucose, Bld: 93 mg/dL (ref 65–99)
Potassium: 4 mmol/L (ref 3.5–5.1)
SODIUM: 142 mmol/L (ref 135–145)

## 2016-02-16 LAB — METHYLMALONIC ACID, SERUM: METHYLMALONIC ACID, QUANTITATIVE: 368 nmol/L (ref 0–378)

## 2016-02-16 LAB — TROPONIN I: Troponin I: <0.03 ng/mL (ref <0.031)

## 2016-02-16 MED ORDER — ASPIRIN 81 MG PO TBEC: 81.0000 mg | DELAYED_RELEASE_TABLET | Freq: Every day | ORAL | Status: AC

## 2016-02-16 MED ORDER — FUROSEMIDE 40 MG PO TABS: 40.0000 mg | ORAL_TABLET | Freq: Two times a day (BID) | ORAL | Status: DC

## 2016-02-16 MED ORDER — ISOSORBIDE MONONITRATE ER 30 MG PO TB24: 30.0000 mg | ORAL_TABLET | Freq: Two times a day (BID) | ORAL | Status: DC

## 2016-02-16 MED ORDER — CARVEDILOL 25 MG PO TABS: 25.0000 mg | ORAL_TABLET | Freq: Two times a day (BID) | ORAL | Status: DC

## 2016-02-16 MED ORDER — CLONIDINE HCL 0.1 MG PO TABS: 0.1000 mg | ORAL_TABLET | Freq: Two times a day (BID) | ORAL | Status: DC

## 2016-02-16 NOTE — Discharge Instructions (Signed)
Heart Failure Clinic appointment on Feb 19, 2016 at 1:00pm with Darylene Price, [place]. Please call [phone] to reschedule.   Heart healthy diet. Activity as tolerated.

## 2016-02-16 NOTE — Progress Notes (Signed)
[place] at [place] was admitted to the Hospital on 02/11/2016 and Discharged  02/16/2016 and should be excused from work/school   for 14  days starting 02/11/2016, follow up appointments to decide whether go back to work.  Demetrios Loll MD.  Demetrios Loll M.D on 02/16/2016,at 1:29 PM  [place] [facility]  [phone]

## 2016-02-16 NOTE — Progress Notes (Signed)
Discharge instructions explained to pt and pts mother/ verbalized an understanding/ iv and tele removed/ rx given to pt/ will transport off unit via wheelchair.

## 2016-02-16 NOTE — Progress Notes (Signed)
Subjective:  Patient had CT-guided renal biopsy yesterday. She tolerated this well. Creatinine stable at 2.94. She is adamant that she would like to leave today.     Objective:  Vital signs in last 24 hours:  Temp:  [98.2 F (36.8 C)] 98.2 F (36.8 C) (05/02 0512) Pulse Rate:  [78-83] 82 (05/02 0725) Resp:  [16-18] 18 (05/02 0725) BP: (115-136)/(75-87) 130/81 mmHg (05/02 0725) SpO2:  [99 %-100 %] 99 % (05/02 0725) Weight:  [135.94 kg (299 lb 11.1 oz)] 135.94 kg (299 lb 11.1 oz) (05/02 0512)  Weight change: -0.457 kg (-1 lb 0.1 oz) Filed Weights   02/14/16 0432 02/15/16 0522 02/16/16 0512  Weight: 137.531 kg (303 lb 3.2 oz) 136.397 kg (300 lb 11.2 oz) 135.94 kg (299 lb 11.1 oz)    Intake/Output:    Intake/Output Summary (Last 24 hours) at 02/16/16 1108 Last data filed at 02/16/16 0958  Gross per 24 hour  Intake    603 ml  Output   2700 ml  Net  -2097 ml     Physical Exam: General: No acute distress, Sitting up in bed   HEENT Pale conjunctiva, moist oral mucous membranes  Neck supple  Pulm/lungs normal effort,clear to auscultation bilaterally  CVS/Heart S1S2 no rubs  Abdomen:  Soft, nontender, nondistended  Extremities: 2+ pitting edema bilaterally; Left > Right  Neurologic: Alert, oriented, follows commands  Skin: No acute rashes          Basic Metabolic Panel:   Recent Labs Lab 02/11/16 0155 02/12/16 0449 02/13/16 0701 02/14/16 0532 02/16/16 0507  NA 138 141 140 139 142  K 3.7 3.6 3.8 4.1 4.0  CL 110 109 108 107 110  CO2 20* 20* 25 25 23   GLUCOSE 116* 110* 101* 84 93  BUN 39* 34* 37* 37* 35*  CREATININE 3.08* 2.71* 2.93* 2.94* 2.94*  CALCIUM 8.3* 8.8* 8.7* 8.5* 8.9     CBC:  Recent Labs Lab 02/11/16 0155 02/12/16 0449 02/13/16 0701 02/14/16 0532 02/16/16 0507  WBC 12.9* 14.5* 11.2*  --  12.8*  NEUTROABS  --  12.3*  --   --   --   HGB 7.1* 7.6* 7.4* 7.7* 8.7*  HCT 26.1* 27.6* 26.4* 27.4* 30.5*  MCV 59.4* 58.4* 59.0*  --  64.2*   PLT 292 326 289  --  297      Microbiology:  No results found for this or any previous visit (from the past 720 hour(s)).  Coagulation Studies: No results for input(s): LABPROT, INR in the last 72 hours.  Urinalysis: No results for input(s): COLORURINE, LABSPEC, PHURINE, GLUCOSEU, HGBUR, BILIRUBINUR, KETONESUR, PROTEINUR, UROBILINOGEN, NITRITE, LEUKOCYTESUR in the last 72 hours.  Invalid input(s): APPERANCEUR    Imaging: US Transvaginal Non-ob  02/14/2016  CLINICAL DATA:  Menorrhagia x2 years EXAM: TRANSABDOMINAL AND TRANSVAGINAL ULTRASOUND OF PELVIS TECHNIQUE: Both transabdominal and transvaginal ultrasound examinations of the pelvis were performed. Transabdominal technique was performed for global imaging of the pelvis including uterus, ovaries, adnexal regions, and pelvic cul-de-sac. It was necessary to proceed with endovaginal exam following the transabdominal exam to visualize the endometrium. COMPARISON:  12/31/2013 FINDINGS: Uterus Measurements: 7.8 x 3.8 x 4.6 cm. No fibroids or other mass visualized. Endometrium Thickness: 4 mm.  No focal abnormality visualized. Right ovary Measurements: 4.3 x 3.4 x 2.7 cm. Normal appearance/no adnexal mass. Left ovary Measurements: 3.3 x 1.6 x 1.9 cm. Normal appearance/no adnexal mass. Other findings Small volume pelvic ascites. IMPRESSION: Negative pelvic ultrasound. Electronically Signed   By: Bertis Ruddy  [place] Pink M.D.   On: 02/14/2016 18:28   US Pelvis Complete  02/14/2016  CLINICAL DATA:  Menorrhagia x2 years EXAM: TRANSABDOMINAL AND TRANSVAGINAL ULTRASOUND OF PELVIS TECHNIQUE: Both transabdominal and transvaginal ultrasound examinations of the pelvis were performed. Transabdominal technique was performed for global imaging of the pelvis including uterus, ovaries, adnexal regions, and pelvic cul-de-sac. It was necessary to proceed with endovaginal exam following the transabdominal exam to visualize the endometrium. COMPARISON:  12/31/2013  FINDINGS: Uterus Measurements: 7.8 x 3.8 x 4.6 cm. No fibroids or other mass visualized. Endometrium Thickness: 4 mm.  No focal abnormality visualized. Right ovary Measurements: 4.3 x 3.4 x 2.7 cm. Normal appearance/no adnexal mass. Left ovary Measurements: 3.3 x 1.6 x 1.9 cm. Normal appearance/no adnexal mass. Other findings Small volume pelvic ascites. IMPRESSION: Negative pelvic ultrasound. Electronically Signed   By: Julian Hy M.D.   On: 02/14/2016 18:28   Ct Biopsy  02/15/2016  INDICATION: Nephrotic syndrome with need for renal biopsy EXAM: CT RENAL BIOPSY MEDICATIONS: None. ANESTHESIA/SEDATION: Moderate (conscious) sedation was employed during this procedure. A total of Versed 1 mg was administered intravenously. Moderate Sedation Time: 40 minutes. The patient's level of consciousness and vital signs were monitored continuously by radiology nursing throughout the procedure under my direct supervision. FLUOROSCOPY TIME:  Not applicable COMPLICATIONS: None immediate. PROCEDURE: Informed written consent was obtained from the patient after a thorough discussion of the procedural risks, benefits and alternatives. All questions were addressed. Maximal Sterile Barrier Technique was utilized including caps, mask, sterile gowns, sterile gloves, sterile drape, hand hygiene and skin antiseptic. A timeout was performed prior to the initiation of the procedure. Initial CT scanning was performed and the inferior pole of the left kidney was localized. An area was selected for subsequent biopsy needle placement. Initial local anesthesia was not given utilizing 0.25% bupivacaine. Subsequently deep anesthesia was administered utilizing bupivacaine as well to the margin of the lower pole of the left kidney. Utilizing CT fluoroscopic guidance, a 15 gauge guiding needle was placed adjacent to the lower pole of the left kidney. Multiple 16 gauge core biopsies were then obtained. These were sent for pathologic evaluation  and deemed adequate. The guiding needle was then removed following placement of Gel-Foam slurry surrounding the lower pole of the left kidney. Followup scanning shows no significant hematoma. The patient tolerated the procedure well was returned to her room in satisfactory condition. IMPRESSION: Successful CT-guided left random renal biopsy. Electronically Signed   By: Inez Catalina M.D.   On: 02/15/2016 11:56     Medications:   . sodium chloride 10 mL/hr at 02/15/16 0814   . aspirin EC  81 mg Oral Daily  . carvedilol  25 mg Oral BID WC  . cloNIDine  0.1 mg Oral BID  . furosemide  40 mg Oral BID  . heparin subcutaneous  5,000 Units Subcutaneous Q8H  . isosorbide mononitrate  30 mg Oral BID  . sodium chloride flush  3 mL Intravenous Q12H   sodium chloride, acetaminophen, hydrALAZINE, ondansetron (ZOFRAN) IV, oxyCODONE-acetaminophen, sodium chloride flush, sodium chloride flush  Assessment/ Plan:  30 y.o. female with morbid obesity, malignant hypertension, asthma, chronic kidney disease  1.  Acute renal failure on chronic kidney disease stage III.  Baseline creatinine unknown but it was 2.12 in March 2015 with GFR of 36.  Patient also has nephrotic range proteinuria. Cholesterol is not elevated.  Hemoglobin A1c is 5.9%. Differential diagnosis includes FSGS-primary versus secondary due to morbid obesity Patient underwent CT guided renal biopsy  02/15/16.   -  Patient had renal biopsy. She does not want to wait in the hospital for the result of the biopsy. Given stability of renal function this is reasonable. Therefore she will be discharged today and will have follow-up with Dr. Candiss Norse in the next 2 weeks.  2. Severe iron deficiency anemia - Feraheme administration performed, hgb up to 8.7.  3.  Anasarca 2-D echo - EF 50-55% Continue Lasix 40 mg by mouth twice a day.  4. Hypertension.  Blood pressure 130/81.  Continue coreg and clonidine.    5. Nephrotic range proteinuria - s/p renal  biopsy yesterday, awaiting result.    LOS: 5 Amere Bricco 5/2/201711:08 AM

## 2016-02-16 NOTE — Progress Notes (Signed)
[facility] Hematology/Oncology Progress Note  Date of admission: 02/11/2016  Hospital day:  02/15/2016  Chief Complaint: [name] is a 30 y.o. female with chronic kidney disease who was admitted with bilateral lower extremity swelling and shortness of breath.  History of Present Illness:  Prior to recent events, the patient denies any past history of anemia.  Hemoglobin was normal in 2015.  She notes a history of the renal insufficiency  (stage III chronic kidney disease). She presented to the emergency room with bilateral lower extremity swelling as well as shortness of breath.   Initial labs revealed a hematocrit of 26.1, hematoma 7.1, MCV 59., platelets  292,000, and white count 12,900.  BUN was 39 with a creatinine of 3.08. Calcium was 8.3. Albumin was 3.1. Protein was 6.8. Urinalysis reveals greater than 500 mg/dL protein.  Iron studies included a ferritin of 9 (low), iron saturation 3% (low) and TIBC 506 (elevated) consistent with iron deficiency anemia. B12 was 352 and folate 8.4. Guaiac was negative. TSH was 2.16 (normal).  Hepatitis B and C serologies were negative. HIV testing was negative.  She has received 1 unit of packed red blood cells as well as a Feraheme for her iron deficiency anemia. She has undergone a laboratory workup for her chronic kidney disease. She underwent a left sided renal biopsy today.  Echo on 02/12/2016 revealed an EF of 50-55%.  She notes a good diet.  She eats meat regularly. She denies any melena or hematochezia.  She does note heavy menses. She has a period every 28 days lasting from 5-7 days.  Menses are heavy requiring a pad and tampon about 6 times a day. She denies any lightheadedness. She has ice pica.  She denies any excess bruising or bleeding.  She has undergone a tonsillectomy without excess bleeding.  Subjective:  Feeling a little better today.  Notes plans for discharge tomorrow.  Social History: The patient is  accompanied by her mother  today.  Allergies: No Known Allergies  Scheduled Medications: . aspirin EC  81 mg Oral Daily  . carvedilol  25 mg Oral BID WC  . cloNIDine  0.1 mg Oral BID  . furosemide  40 mg Oral BID  . heparin subcutaneous  5,000 Units Subcutaneous Q8H  . isosorbide mononitrate  30 mg Oral BID  . sodium chloride flush  3 mL Intravenous Q12H    Review of Systems: GENERAL:  Feels better.  No fevers, sweats or weight loss. PERFORMANCE STATUS (ECOG):  1 HEENT:  No visual changes, runny nose, sore throat, mouth sores or tenderness. Lungs: Shortness of breath, resolved.  No cough.  No hemoptysis. Cardiac:  No chest pain, palpitations, orthopnea, or PND. GI:  No nausea, vomiting, diarrhea, constipation, melena or hematochezia. GU:  No urgency, frequency, dysuria, or hematuria.  Heavy menses. Musculoskeletal:  No back pain.  No joint pain.  No muscle tenderness. Extremities:  Lower extremity swelling. Skin:  No rashes or skin changes. Neuro:  General weakness.  No headache, numbness or weakness, balance or coordination issues. Endocrine:  No diabetes, thyroid issues, hot flashes or night sweats. Psych:  No mood changes, depression or anxiety. Pain:  No focal pain. Review of systems:  All other systems reviewed and found to be negative.  Physical Exam: Blood pressure 130/81, pulse 82, temperature 98.2 F (36.8 C), temperature source Oral, resp. rate 18, height 5' 6"  (1.676 m), weight 299 lb 11.1 oz (135.94 kg), last menstrual period 02/14/2016, SpO2 99 %.  GENERAL:  Well developed, well nourished, heavy set woman sitting comfortably in the exam room in no acute distress. MENTAL STATUS:  Alert and oriented to person, place and time. HEAD:  Brown hair.  Normocephalic, atraumatic, face full and symmetric, no Cushingoid features. EYES:  Brown eyes.  Pupils equal round and reactive to light and accomodation.  No conjunctivitis or scleral icterus. ENT:  Oropharynx clear without  lesion.  Tongue normal. Mucous membranes moist.  RESPIRATORY:  Clear to auscultation without rales, wheezes or rhonchi. CARDIOVASCULAR:  Regular rate and rhythm without murmur, rub or gallop. ABDOMEN:  Soft, non-tender, with active bowel sounds, and no appreciable hepatosplenomegaly.  No masses. BACK:  Right flank dressing s/p renal biopsy.   SKIN:  No rashes, ulcers or lesions. EXTREMITIES: Bilateral lower extremity edema (left > right).  No skin discoloration or tenderness.  No palpable cords. LYMPH NODES: No palpable cervical, supraclavicular, axillary or inguinal adenopathy  NEUROLOGICAL: Unremarkable. PSYCH:  Appropriate.  Results for orders placed or performed during the hospital encounter of 02/11/16 (from the past 48 hour(s))  TSH     Status: None   Collection Time: 02/14/16  2:13 PM  Result Value Ref Range   TSH 2.104 0.350 - 4.500 uIU/mL  Troponin I     Status: None   Collection Time: 02/16/16  5:07 AM  Result Value Ref Range   Troponin I <0.03 <0.031 ng/mL    Comment:        NO INDICATION OF MYOCARDIAL INJURY.   Basic metabolic panel     Status: Abnormal   Collection Time: 02/16/16  5:07 AM  Result Value Ref Range   Sodium 142 135 - 145 mmol/L   Potassium 4.0 3.5 - 5.1 mmol/L   Chloride 110 101 - 111 mmol/L   CO2 23 22 - 32 mmol/L   Glucose, Bld 93 65 - 99 mg/dL   BUN 35 (H) 6 - 20 mg/dL   Creatinine, Ser 2.94 (H) 0.44 - 1.00 mg/dL   Calcium 8.9 8.9 - 10.3 mg/dL   GFR calc non Af Amer 20 (L) >60 mL/min   GFR calc Af Amer 24 (L) >60 mL/min    Comment: (NOTE) The eGFR has been calculated using the CKD EPI equation. This calculation has not been validated in all clinical situations. eGFR's persistently <60 mL/min signify possible Chronic Kidney Disease.    Anion gap 9 5 - 15  CBC     Status: Abnormal   Collection Time: 02/16/16  5:07 AM  Result Value Ref Range   WBC 12.8 (H) 3.6 - 11.0 K/uL   RBC 4.75 3.80 - 5.20 MIL/uL   Hemoglobin 8.7 (L) 12.0 - 16.0 g/dL     Comment: RESULT REPEATED AND VERIFIED   HCT 30.5 (L) 35.0 - 47.0 %   MCV 64.2 (L) 80.0 - 100.0 fL   MCH 18.3 (L) 26.0 - 34.0 pg   MCHC 28.5 (L) 32.0 - 36.0 g/dL   RDW 23.1 (H) 11.5 - 14.5 %   Platelets 297 150 - 440 K/uL   US Transvaginal Non-ob  02/14/2016  CLINICAL DATA:  Menorrhagia x2 years EXAM: TRANSABDOMINAL AND TRANSVAGINAL ULTRASOUND OF PELVIS TECHNIQUE: Both transabdominal and transvaginal ultrasound examinations of the pelvis were performed. Transabdominal technique was performed for global imaging of the pelvis including uterus, ovaries, adnexal regions, and pelvic cul-de-sac. It was necessary to proceed with endovaginal exam following the transabdominal exam to visualize the endometrium. COMPARISON:  12/31/2013 FINDINGS: Uterus Measurements: 7.8 x 3.8 x 4.6  cm. No fibroids or other mass visualized. Endometrium Thickness: 4 mm.  No focal abnormality visualized. Right ovary Measurements: 4.3 x 3.4 x 2.7 cm. Normal appearance/no adnexal mass. Left ovary Measurements: 3.3 x 1.6 x 1.9 cm. Normal appearance/no adnexal mass. Other findings Small volume pelvic ascites. IMPRESSION: Negative pelvic ultrasound. Electronically Signed   By: Julian Hy M.D.   On: 02/14/2016 18:28   US Pelvis Complete  02/14/2016  CLINICAL DATA:  Menorrhagia x2 years EXAM: TRANSABDOMINAL AND TRANSVAGINAL ULTRASOUND OF PELVIS TECHNIQUE: Both transabdominal and transvaginal ultrasound examinations of the pelvis were performed. Transabdominal technique was performed for global imaging of the pelvis including uterus, ovaries, adnexal regions, and pelvic cul-de-sac. It was necessary to proceed with endovaginal exam following the transabdominal exam to visualize the endometrium. COMPARISON:  12/31/2013 FINDINGS: Uterus Measurements: 7.8 x 3.8 x 4.6 cm. No fibroids or other mass visualized. Endometrium Thickness: 4 mm.  No focal abnormality visualized. Right ovary Measurements: 4.3 x 3.4 x 2.7 cm. Normal appearance/no  adnexal mass. Left ovary Measurements: 3.3 x 1.6 x 1.9 cm. Normal appearance/no adnexal mass. Other findings Small volume pelvic ascites. IMPRESSION: Negative pelvic ultrasound. Electronically Signed   By: Julian Hy M.D.   On: 02/14/2016 18:28   Ct Biopsy  02/15/2016  INDICATION: Nephrotic syndrome with need for renal biopsy EXAM: CT RENAL BIOPSY MEDICATIONS: None. ANESTHESIA/SEDATION: Moderate (conscious) sedation was employed during this procedure. A total of Versed 1 mg was administered intravenously. Moderate Sedation Time: 40 minutes. The patient's level of consciousness and vital signs were monitored continuously by radiology nursing throughout the procedure under my direct supervision. FLUOROSCOPY TIME:  Not applicable COMPLICATIONS: None immediate. PROCEDURE: Informed written consent was obtained from the patient after a thorough discussion of the procedural risks, benefits and alternatives. All questions were addressed. Maximal Sterile Barrier Technique was utilized including caps, mask, sterile gowns, sterile gloves, sterile drape, hand hygiene and skin antiseptic. A timeout was performed prior to the initiation of the procedure. Initial CT scanning was performed and the inferior pole of the left kidney was localized. An area was selected for subsequent biopsy needle placement. Initial local anesthesia was not given utilizing 0.25% bupivacaine. Subsequently deep anesthesia was administered utilizing bupivacaine as well to the margin of the lower pole of the left kidney. Utilizing CT fluoroscopic guidance, a 15 gauge guiding needle was placed adjacent to the lower pole of the left kidney. Multiple 16 gauge core biopsies were then obtained. These were sent for pathologic evaluation and deemed adequate. The guiding needle was then removed following placement of Gel-Foam slurry surrounding the lower pole of the left kidney. Followup scanning shows no significant hematoma. The patient tolerated the  procedure well was returned to her room in satisfactory condition. IMPRESSION: Successful CT-guided left random renal biopsy. Electronically Signed   By: Inez Catalina M.D.   On: 02/15/2016 11:56    Assessment:  SUELLA COGAR is a 30 y.o. female with renal insufficiency and iron deficiency anemia.  Iron deficiency anemia is likely due to heavy menses.  Diet is good.  She denies melena or hematochezia.  Work-up on 02/11/2016 revealed iron deficiency anemia with a ferritin of 9 (low), iron saturation 3% (low) and TIBC 506 (elevated).  MCV was 59.  BUN was 39 with a creatinine of 3.08.  Calcium was 8.3. Albumin was 3.1 and protein was 6.8. Urinalysis reveals greater than 500 mg/dL protein.  Normal labs included:  B12, folate, TSH, hepatitis B and C serologies, and HIV testing.  Guaiac was negative.  She also has anemia of chronic kidney disease.  Etiology unclear.  Renal biopsy performed today.  Plan: 1.  Hematology/Oncology:  Iron deficiency anemia and anemia of chronic kidney disease.  She has received 1 unit of PRBCs and Feraheme.  Will follow-up in outpatient department for additional Feraheme.  She is s/p Depo-Provera.  Doubt myeloma given young age.  Await kidney biopsy. 2.  Nephrology:  Follow-up kidney biopsy.  If iron deficiency anemia corrected and patient remains anemic in the outpatient department, may require an ESA.   Lequita Asal, MD  02/16/2016, 8:33 AM

## 2016-02-16 NOTE — Discharge Summary (Signed)
[place] [facility] [place] NAME: [name]    MR#:  [id number]  DATE OF BIRTH:  1986/05/10  DATE OF ADMISSION:  02/11/2016 ADMITTING PHYSICIAN: Saundra Shelling, MD  DATE OF DISCHARGE: 02/16/2016 PRIMARY CARE PHYSICIAN: No PCP Per Patient    ADMISSION DIAGNOSIS:  Tachypnea [R06.82] Sinus tachycardia ([place]) [R00.0] Peripheral edema [R60.9] Hypertensive urgency [I16.0] Acute renal failure, unspecified acute renal failure type ([place]) [N17.9] Acute congestive heart failure, unspecified congestive heart failure type ([place]) [I50.9]   DISCHARGE DIAGNOSIS:  Acute Diastolic CHF Anemia due to iron deficiency 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 SECONDARY DIAGNOSIS:   Past Medical History  Diagnosis Date  . Hypertension   . Asthma   . CKD (chronic kidney disease), stage III   . Anemia of chronic disease   . Morbid obesity [facility])     HOSPITAL COURSE:   [name] is a 30 y.o. female with a known history of Hypertension, bronchial asthma, chronic kidney disease presented to the emergency room because she had swelling in both the legs. Patient says she has been retaining fluid and swelling in the legs for more than a week. She does not have any primary care physician. She also has a shortness of breath and which has been worsening for the last few days  1. Acute Diastolic CHF :  She was treated wtih Lasix which was discontinued. Resumed lasix 40 mg bid per Dr. Candiss Norse. Echocardiogram EF: 50% - 55%. Her symptoms has much improved, leg swelling is much better.  2. Anemia due to iron deficiency last hemoglobin in March 2015 was normal Hemoglobin  - Severe iron deficiency.recieved dose of IV iron x2 -hematology to see pt- f/u with Dr Mike Gip for weekly IV ferraheme -s/p 1 unit BT on 4/29. Hemoglobin is stable. Hemoglobin today is 8.7.   3. 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  Status post renal biopsy. Follow up pathology and BMP with nephrologist. Resume aspirin today. Renal function is stable at 2.94. Dr. Holley Raring suggested stay in the hospital 1 more day to day and follow-up kidney biopsy report. But the patient insisted going home today.  4. Elevated troponin, could be demand ischemia or acute renal failure. She was on heparin drip.   5. Miscellaneous heparin for DVT prophylaxis  6. Menorrorrahgia. She was treated with Depo-PROVERA. Follow-up with OB/GYN as outpatient.  7. Malignant emergent HTN. Controlled. She has been treated with Coreg 25mg  BID, Clonidine 0.1mg  BID, and Imdur 30mg  BID.  I discussed with Dr. Holley Raring and Dr. Rockey Situ.  DISCHARGE CONDITIONS:   Stable, discharge to home today.  CONSULTS OBTAINED:  Treatment Team:  Murlean Iba, MD Rubie Maid, MD  DRUG ALLERGIES:  No Known Allergies  DISCHARGE MEDICATIONS:   Current Discharge Medication List    START taking these medications   Details  aspirin EC 81 MG EC tablet Take 1 tablet (81 mg total) by mouth daily. Qty: 30 tablet, Refills: 2    carvedilol (COREG) 25 MG tablet Take 1 tablet (25 mg total) by mouth 2 (two) times daily with a meal. Qty: 60 tablet, Refills: 0    cloNIDine (CATAPRES) 0.1 MG tablet Take 1 tablet (0.1 mg total) by mouth 2 (two) times daily. Qty: 60 tablet, Refills: 0    furosemide (LASIX) 40 MG tablet Take 1 tablet (40 mg total) by mouth 2 (two) times daily. Qty: 60  tablet, Refills: 0    isosorbide mononitrate (IMDUR) 30 MG 24 hr tablet Take 1 tablet (30 mg total) by mouth 2 (two) times daily. Qty: 60 tablet, Refills: 0      STOP taking these medications     hydrochlorothiazide (HYDRODIURIL) 25 MG tablet          DISCHARGE INSTRUCTIONS:    If you experience worsening of your admission symptoms, develop shortness of breath, life threatening emergency, suicidal or homicidal thoughts you must  seek medical attention immediately by calling 911 or calling your MD immediately  if symptoms less severe.  You Must read complete instructions/literature along with all the possible adverse reactions/side effects for all the Medicines you take and that have been prescribed to you. Take any new Medicines after you have completely understood and accept all the possible adverse reactions/side effects.   Please note  You were cared for by a hospitalist during your hospital stay. If you have any questions about your discharge medications or the care you received while you were in the hospital after you are discharged, you can call the unit and asked to speak with the hospitalist on call if the hospitalist that took care of you is not available. Once you are discharged, your primary care physician will handle any further medical issues. Please note that NO REFILLS for any discharge medications will be authorized once you are discharged, as it is imperative that you return to your primary care physician (or establish a relationship with a primary care physician if you do not have one) for your aftercare needs so that they can reassess your need for medications and monitor your lab values.    Today   SUBJECTIVE   No complaint.   VITAL SIGNS:  Blood pressure 115/67, pulse 71, temperature 97.7 F (36.5 C), temperature source Oral, resp. rate 18, height 5\' 6"  (1.676 m), weight 135.94 kg (299 lb 11.1 oz), last menstrual period 02/14/2016, SpO2 100 %.  I/O:   Intake/Output Summary (Last 24 hours) at 02/16/16 1126 Last data filed at 02/16/16 0958  Gross per 24 hour  Intake    603 ml  Output   2700 ml  Net  -2097 ml    PHYSICAL EXAMINATION:  GENERAL:  30 y.o.-year-old patient lying in the bed with no acute distress. Morbidly obese. EYES: Pupils equal, round, reactive to light and accommodation. No scleral icterus. Extraocular muscles intact.  HEENT: Head atraumatic, normocephalic. Oropharynx and  nasopharynx clear.  NECK:  Supple, no jugular venous distention. No thyroid enlargement, no tenderness.  LUNGS: Normal breath sounds bilaterally, no wheezing, rales,rhonchi or crepitation. No use of accessory muscles of respiration.  CARDIOVASCULAR: S1, S2 normal. No murmurs, rubs, or gallops.  ABDOMEN: Soft, non-tender, non-distended. Bowel sounds present. No organomegaly or mass.  EXTREMITIES: Trace leg edema, no cyanosis, or clubbing.  NEUROLOGIC: Cranial nerves II through XII are intact. Muscle strength 5/5 in all extremities. Sensation intact. Gait not checked.  PSYCHIATRIC: The patient is alert and oriented x 3.  SKIN: No obvious rash, lesion, or ulcer.   DATA REVIEW:   CBC  Recent Labs Lab 02/16/16 0507  WBC 12.8*  HGB 8.7*  HCT 30.5*  PLT 297    Chemistries   Recent Labs Lab 02/11/16 0155  02/16/16 0507  NA 138  < > 142  K 3.7  < > 4.0  CL 110  < > 110  CO2 20*  < > 23  GLUCOSE 116*  < > 93  BUN  39*  < > 35*  CREATININE 3.08*  < > 2.94*  CALCIUM 8.3*  < > 8.9  AST 19  --   --   ALT 13*  --   --   ALKPHOS 67  --   --   BILITOT 0.9  --   --   < > = values in this interval not displayed.  Cardiac Enzymes  Recent Labs Lab 02/16/16 0507  TROPONINI <0.03    Microbiology Results  No results found for this or any previous visit.  RADIOLOGY:  US Transvaginal Non-ob  02/14/2016  CLINICAL DATA:  Menorrhagia x2 years EXAM: TRANSABDOMINAL AND TRANSVAGINAL ULTRASOUND OF PELVIS TECHNIQUE: Both transabdominal and transvaginal ultrasound examinations of the pelvis were performed. Transabdominal technique was performed for global imaging of the pelvis including uterus, ovaries, adnexal regions, and pelvic cul-de-sac. It was necessary to proceed with endovaginal exam following the transabdominal exam to visualize the endometrium. COMPARISON:  12/31/2013 FINDINGS: Uterus Measurements: 7.8 x 3.8 x 4.6 cm. No fibroids or other mass visualized. Endometrium Thickness: 4 mm.   No focal abnormality visualized. Right ovary Measurements: 4.3 x 3.4 x 2.7 cm. Normal appearance/no adnexal mass. Left ovary Measurements: 3.3 x 1.6 x 1.9 cm. Normal appearance/no adnexal mass. Other findings Small volume pelvic ascites. IMPRESSION: Negative pelvic ultrasound. Electronically Signed   By: Julian Hy M.D.   On: 02/14/2016 18:28   US Pelvis Complete  02/14/2016  CLINICAL DATA:  Menorrhagia x2 years EXAM: TRANSABDOMINAL AND TRANSVAGINAL ULTRASOUND OF PELVIS TECHNIQUE: Both transabdominal and transvaginal ultrasound examinations of the pelvis were performed. Transabdominal technique was performed for global imaging of the pelvis including uterus, ovaries, adnexal regions, and pelvic cul-de-sac. It was necessary to proceed with endovaginal exam following the transabdominal exam to visualize the endometrium. COMPARISON:  12/31/2013 FINDINGS: Uterus Measurements: 7.8 x 3.8 x 4.6 cm. No fibroids or other mass visualized. Endometrium Thickness: 4 mm.  No focal abnormality visualized. Right ovary Measurements: 4.3 x 3.4 x 2.7 cm. Normal appearance/no adnexal mass. Left ovary Measurements: 3.3 x 1.6 x 1.9 cm. Normal appearance/no adnexal mass. Other findings Small volume pelvic ascites. IMPRESSION: Negative pelvic ultrasound. Electronically Signed   By: Julian Hy M.D.   On: 02/14/2016 18:28   Ct Biopsy  02/15/2016  INDICATION: Nephrotic syndrome with need for renal biopsy EXAM: CT RENAL BIOPSY MEDICATIONS: None. ANESTHESIA/SEDATION: Moderate (conscious) sedation was employed during this procedure. A total of Versed 1 mg was administered intravenously. Moderate Sedation Time: 40 minutes. The patient's level of consciousness and vital signs were monitored continuously by radiology nursing throughout the procedure under my direct supervision. FLUOROSCOPY TIME:  Not applicable COMPLICATIONS: None immediate. PROCEDURE: Informed written consent was obtained from the patient after a thorough  discussion of the procedural risks, benefits and alternatives. All questions were addressed. Maximal Sterile Barrier Technique was utilized including caps, mask, sterile gowns, sterile gloves, sterile drape, hand hygiene and skin antiseptic. A timeout was performed prior to the initiation of the procedure. Initial CT scanning was performed and the inferior pole of the left kidney was localized. An area was selected for subsequent biopsy needle placement. Initial local anesthesia was not given utilizing 0.25% bupivacaine. Subsequently deep anesthesia was administered utilizing bupivacaine as well to the margin of the lower pole of the left kidney. Utilizing CT fluoroscopic guidance, a 15 gauge guiding needle was placed adjacent to the lower pole of the left kidney. Multiple 16 gauge core biopsies were then obtained. These were sent for pathologic evaluation and deemed  adequate. The guiding needle was then removed following placement of Gel-Foam slurry surrounding the lower pole of the left kidney. Followup scanning shows no significant hematoma. The patient tolerated the procedure well was returned to her room in satisfactory condition. IMPRESSION: Successful CT-guided left random renal biopsy. Electronically Signed   By: Inez Catalina M.D.   On: 02/15/2016 11:56        Management plans discussed with the patient, family and they are in agreement.  CODE STATUS:     Code Status Orders        Start     Ordered   02/11/16 0708  Full code   Continuous     02/11/16 0707    Code Status History    Date Active Date Inactive Code Status Order ID Comments User Context   This patient has a current code status but no historical code status.      TOTAL TIME TAKING CARE OF THIS PATIENT:  37 minutes.    Demetrios Loll M.D on 02/16/2016 at 11:26 AM  Between 7am to 6pm - Pager - [phone]  After 6pm go to [email or web address] - password [email or web address] [facility]  [facility]  Office   [phone]  CC: Primary care physician; No PCP Per Patient

## 2016-02-16 NOTE — Care Management (Signed)
Provided patient with scales to monitor weight.

## 2016-02-18 ENCOUNTER — Inpatient Hospital Stay (HOSPITAL_BASED_OUTPATIENT_CLINIC_OR_DEPARTMENT_OTHER): Payer: Managed Care, Other (non HMO) | Admitting: Hematology and Oncology

## 2016-02-18 ENCOUNTER — Inpatient Hospital Stay: Payer: Managed Care, Other (non HMO)

## 2016-02-18 ENCOUNTER — Encounter: Payer: Self-pay | Admitting: Hematology and Oncology

## 2016-02-18 ENCOUNTER — Encounter: Payer: Self-pay | Admitting: *Deleted

## 2016-02-18 ENCOUNTER — Other Ambulatory Visit: Payer: Self-pay | Admitting: Hematology and Oncology

## 2016-02-18 ENCOUNTER — Inpatient Hospital Stay: Payer: Managed Care, Other (non HMO) | Attending: Hematology and Oncology

## 2016-02-18 VITALS — BP 138/85 | HR 79 | Temp 95.9°F | Resp 18 | Ht 66.0 in | Wt 290.9 lb

## 2016-02-18 DIAGNOSIS — Z7982 Long term (current) use of aspirin: Secondary | ICD-10-CM | POA: Diagnosis not present

## 2016-02-18 DIAGNOSIS — N183 Chronic kidney disease, stage 3 (moderate): Secondary | ICD-10-CM | POA: Diagnosis not present

## 2016-02-18 DIAGNOSIS — D509 Iron deficiency anemia, unspecified: Secondary | ICD-10-CM

## 2016-02-18 DIAGNOSIS — N289 Disorder of kidney and ureter, unspecified: Secondary | ICD-10-CM | POA: Diagnosis not present

## 2016-02-18 DIAGNOSIS — I129 Hypertensive chronic kidney disease with stage 1 through stage 4 chronic kidney disease, or unspecified chronic kidney disease: Secondary | ICD-10-CM | POA: Diagnosis not present

## 2016-02-18 DIAGNOSIS — Z79899 Other long term (current) drug therapy: Secondary | ICD-10-CM

## 2016-02-18 DIAGNOSIS — N920 Excessive and frequent menstruation with regular cycle: Secondary | ICD-10-CM

## 2016-02-18 DIAGNOSIS — D6489 Other specified anemias: Secondary | ICD-10-CM

## 2016-02-18 DIAGNOSIS — J45909 Unspecified asthma, uncomplicated: Secondary | ICD-10-CM | POA: Insufficient documentation

## 2016-02-18 LAB — COMPREHENSIVE METABOLIC PANEL
ALT: 13 U/L — ABNORMAL LOW (ref 14–54)
AST: 15 U/L (ref 15–41)
Albumin: 3.5 g/dL (ref 3.5–5.0)
Alkaline Phosphatase: 65 U/L (ref 38–126)
Anion gap: 9 (ref 5–15)
BUN: 51 mg/dL — ABNORMAL HIGH (ref 6–20)
CO2: 24 mmol/L (ref 22–32)
Calcium: 8.9 mg/dL (ref 8.9–10.3)
Chloride: 105 mmol/L (ref 101–111)
Creatinine, Ser: 3.14 mg/dL — ABNORMAL HIGH (ref 0.44–1.00)
GFR calc Af Amer: 22 mL/min — ABNORMAL LOW (ref >60)
GFR calc non Af Amer: 19 mL/min — ABNORMAL LOW (ref >60)
Glucose, Bld: 123 mg/dL — ABNORMAL HIGH (ref 65–99)
Potassium: 3.8 mmol/L (ref 3.5–5.1)
Sodium: 138 mmol/L (ref 135–145)
Total Bilirubin: 0.6 mg/dL (ref 0.3–1.2)
Total Protein: 7.4 g/dL (ref 6.5–8.1)

## 2016-02-18 LAB — CBC WITH DIFFERENTIAL/PLATELET
BASOS ABS: 0.1 10*3/uL (ref 0–0.1)
Basophils Relative: 1 %
EOS ABS: 0.3 10*3/uL (ref 0–0.7)
HCT: 33 % — ABNORMAL LOW (ref 35.0–47.0)
Hemoglobin: 9.8 g/dL — ABNORMAL LOW (ref 12.0–16.0)
Lymphs Abs: 1.8 10*3/uL (ref 1.0–3.6)
MCH: 19.1 pg — ABNORMAL LOW (ref 26.0–34.0)
MCHC: 29.6 g/dL — ABNORMAL LOW (ref 32.0–36.0)
MCV: 64.6 fL — ABNORMAL LOW (ref 80.0–100.0)
Monocytes Absolute: 0.7 10*3/uL (ref 0.2–0.9)
Monocytes Relative: 5 %
Neutro Abs: 10.5 10*3/uL — ABNORMAL HIGH (ref 1.4–6.5)
Neutrophils Relative %: 79 %
PLATELETS: 301 10*3/uL (ref 150–440)
RBC: 5.11 MIL/uL (ref 3.80–5.20)
RDW: 27 % — ABNORMAL HIGH (ref 11.5–14.5)
WBC: 13.3 10*3/uL — AB (ref 3.6–11.0)

## 2016-02-18 LAB — SAMPLE TO BLOOD BANK

## 2016-02-18 LAB — FERRITIN: Ferritin: 83 ng/mL (ref 11–307)

## 2016-02-18 NOTE — Progress Notes (Signed)
Pt reports heavy periods first four days.  Pt reports that since she received Depo shot periods have not been heavy this time.

## 2016-02-19 ENCOUNTER — Ambulatory Visit: Payer: Managed Care, Other (non HMO) | Admitting: Family

## 2016-02-19 ENCOUNTER — Telehealth: Payer: Self-pay | Admitting: Family

## 2016-02-19 NOTE — Telephone Encounter (Signed)
Patient did not show for her Initial [facility] appointment on 02/19/16. Will attempt to reschedule.

## 2016-03-01 ENCOUNTER — Encounter: Payer: Managed Care, Other (non HMO) | Admitting: Nurse Practitioner

## 2016-03-02 ENCOUNTER — Encounter: Payer: Self-pay | Admitting: Obstetrics and Gynecology

## 2016-03-02 LAB — SURGICAL PATHOLOGY

## 2016-03-03 ENCOUNTER — Ambulatory Visit: Payer: Managed Care, Other (non HMO) | Admitting: Cardiovascular Disease

## 2016-03-03 ENCOUNTER — Encounter: Payer: Self-pay | Admitting: *Deleted

## 2016-03-07 ENCOUNTER — Encounter: Payer: Self-pay | Admitting: Nephrology

## 2016-03-10 ENCOUNTER — Ambulatory Visit: Payer: Managed Care, Other (non HMO) | Attending: Family | Admitting: Family

## 2016-03-10 ENCOUNTER — Encounter: Payer: Self-pay | Admitting: Family

## 2016-03-10 VITALS — BP 117/62 | HR 78 | Resp 18 | Ht 66.0 in | Wt 292.0 lb

## 2016-03-10 DIAGNOSIS — J45909 Unspecified asthma, uncomplicated: Secondary | ICD-10-CM | POA: Insufficient documentation

## 2016-03-10 DIAGNOSIS — I10 Essential (primary) hypertension: Secondary | ICD-10-CM

## 2016-03-10 DIAGNOSIS — N289 Disorder of kidney and ureter, unspecified: Secondary | ICD-10-CM

## 2016-03-10 DIAGNOSIS — D631 Anemia in chronic kidney disease: Secondary | ICD-10-CM | POA: Diagnosis not present

## 2016-03-10 DIAGNOSIS — Z6841 Body Mass Index (BMI) 40.0 and over, adult: Secondary | ICD-10-CM | POA: Insufficient documentation

## 2016-03-10 DIAGNOSIS — I130 Hypertensive heart and chronic kidney disease with heart failure and stage 1 through stage 4 chronic kidney disease, or unspecified chronic kidney disease: Secondary | ICD-10-CM | POA: Insufficient documentation

## 2016-03-10 DIAGNOSIS — I5032 Chronic diastolic (congestive) heart failure: Secondary | ICD-10-CM | POA: Diagnosis present

## 2016-03-10 DIAGNOSIS — N183 Chronic kidney disease, stage 3 (moderate): Secondary | ICD-10-CM | POA: Diagnosis not present

## 2016-03-10 MED ORDER — CARVEDILOL 25 MG PO TABS: 25.0000 mg | ORAL_TABLET | Freq: Two times a day (BID) | ORAL | Status: DC

## 2016-03-10 MED ORDER — ISOSORBIDE MONONITRATE ER 30 MG PO TB24: 30.0000 mg | ORAL_TABLET | Freq: Two times a day (BID) | ORAL | Status: DC

## 2016-03-10 MED ORDER — CLONIDINE HCL 0.1 MG PO TABS: 0.1000 mg | ORAL_TABLET | Freq: Two times a day (BID) | ORAL | Status: DC

## 2016-03-10 NOTE — Progress Notes (Signed)
Subjective:    Patient ID: [name], female    DOB: 08/22/1986, 30 y.o.   MRN: [id number]  Congestive Heart Failure Presents for initial visit. The disease course has been stable. Pertinent negatives include no abdominal pain, chest pain, edema, fatigue, orthopnea, palpitations or shortness of breath. The symptoms have been improving. Past treatments include salt and fluid restriction, beta blockers and ACE inhibitors. The treatment provided significant relief. Compliance with prior treatments has been good. Her past medical history is significant for anemia, chronic lung disease and HTN. There is no history of CVA or DM.  Hypertension This is a chronic problem. The current episode started more than 1 year ago. The problem has been gradually improving since onset. Pertinent negatives include no chest pain, headaches, neck pain, palpitations, peripheral edema or shortness of breath. There are no associated agents to hypertension. Risk factors for coronary artery disease include obesity. Past treatments include ACE inhibitors, beta blockers, alpha 1 blockers, diuretics and lifestyle changes. The current treatment provides significant improvement. There are no compliance problems.  Hypertensive end-organ damage includes kidney disease and heart failure.   Past Medical History  Diagnosis Date  . Hypertension   . Asthma   . CKD (chronic kidney disease), stage III   . Anemia of chronic disease   . Morbid obesity [facility])     Past Surgical History  Procedure Laterality Date  . Tonsillectomy      Family History  Problem Relation Age of Onset  . Hypertension Mother   . Multiple sclerosis Mother   . Cancer Maternal Grandmother     Ovarian   . Cancer Maternal Grandfather     Prostate     Social History  Substance Use Topics  . Smoking status: Never Smoker   . Smokeless tobacco: Never Used  . Alcohol Use: No    No Known Allergies  Prior to Admission medications   Medication Sig  Start Date End Date Taking? Authorizing Provider  aspirin EC 81 MG EC tablet Take 1 tablet (81 mg total) by mouth daily. Patient taking differently: Take 81 mg by mouth as needed for pain.  02/16/16  Yes Demetrios Loll, MD  carvedilol (COREG) 25 MG tablet Take 1 tablet (25 mg total) by mouth 2 (two) times daily with a meal. 03/10/16  Yes Alisa Graff, FNP  cloNIDine (CATAPRES) 0.1 MG tablet Take 1 tablet (0.1 mg total) by mouth 2 (two) times daily. 03/10/16  Yes Alisa Graff, FNP  enalapril (VASOTEC) 5 MG tablet Take 5 mg by mouth daily.   Yes Historical Provider, MD  Ferrous Sulfate (FERROUSUL PO) Take 1 tablet by mouth daily.   Yes Historical Provider, MD  furosemide (LASIX) 40 MG tablet Take 1 tablet (40 mg total) by mouth 2 (two) times daily. 02/16/16  Yes Demetrios Loll, MD  isosorbide mononitrate (IMDUR) 30 MG 24 hr tablet Take 1 tablet (30 mg total) by mouth 2 (two) times daily. 03/10/16  Yes Alisa Graff, FNP      Review of Systems  Constitutional: Negative for appetite change and fatigue.  HENT: Negative for congestion, rhinorrhea and sore throat.   Eyes: Negative.   Respiratory: Negative for cough, chest tightness and shortness of breath.   Cardiovascular: Negative for chest pain, palpitations and leg swelling.  Gastrointestinal: Negative for abdominal pain and abdominal distention.  Endocrine: Negative.   Genitourinary: Negative.   Musculoskeletal: Negative for back pain and neck pain.  Skin: Negative.   Allergic/Immunologic: Negative.  Neurological: Positive for dizziness (after taking medications). Negative for light-headedness and headaches.  Hematological: Negative for adenopathy. Does not bruise/bleed easily.  Psychiatric/Behavioral: Negative for sleep disturbance (sleeping on 2 pillows) and dysphoric mood. The patient is not nervous/anxious.        Objective:   Physical Exam  Constitutional: She is oriented to person, place, and time. She appears well-developed and  well-nourished.  HENT:  Head: Normocephalic and atraumatic.  Eyes: Conjunctivae are normal. Pupils are equal, round, and reactive to light.  Neck: Normal range of motion. Neck supple.  Cardiovascular: Normal rate and regular rhythm.   Pulmonary/Chest: Effort normal. She has no wheezes. She has no rales.  Abdominal: Soft. She exhibits no distension. There is no tenderness.  Musculoskeletal: She exhibits no edema or tenderness.  Neurological: She is alert and oriented to person, place, and time.  Skin: Skin is warm and dry.  Psychiatric: She has a normal mood and affect. Her behavior is normal. Thought content normal.  Nursing note and vitals reviewed.   BP 117/62 mmHg  Pulse 78  Resp 18  Ht 5\' 6"  (1.676 m)  Wt 292 lb (132.45 kg)  BMI 47.15 kg/m2  SpO2 100%  LMP 02/14/2016       Assessment & Plan:  1: Chronic heart failure with preserved ejection fraction- Patient presents without any fatigue, shortness of breath or swelling in her legs/abdomen. She is not weighing daily and the importance of weighing every morning was discussed. She is to call for an overnight weight gain of >2 pounds or a weekly weight gain of >5 pounds. She does not use salt and uses Mrs. Dash seasoning instead. Discussed the importance of following a 2000mg  sodium diet and reviewed how to read food labels. Written dietary information was also given to patient. She is trying to increase her activity. Tolerating her medications without known side effects. 2: HTN- Blood pressure looks great today. Continue medications at this time. 3: Renal insufficiency- Is being followed closely by Dr. Candiss Norse in this regard.  Medication bottles were reviewed.  Return here in 1 month or sooner for any questions/problems before then.

## 2016-03-10 NOTE — Patient Instructions (Signed)
Begin weighing daily and call for an overnight weight gain of > 2 pounds or a weekly weight gain of >5 pounds. 

## 2016-03-11 ENCOUNTER — Encounter: Payer: Self-pay | Admitting: Family

## 2016-03-11 DIAGNOSIS — N289 Disorder of kidney and ureter, unspecified: Secondary | ICD-10-CM | POA: Insufficient documentation

## 2016-03-11 DIAGNOSIS — I10 Essential (primary) hypertension: Secondary | ICD-10-CM | POA: Insufficient documentation

## 2016-03-15 ENCOUNTER — Encounter: Payer: Self-pay | Admitting: Hematology and Oncology

## 2016-03-15 ENCOUNTER — Other Ambulatory Visit: Payer: Managed Care, Other (non HMO)

## 2016-03-15 NOTE — Progress Notes (Signed)
[place] [facility] day:  02/18/2016  Chief Complaint: [name] is a 30 y.o. female with chronic renal insufficiency and iron deficiency anemia who is seen for reassessment and continuation of IV iron after recent hospitalization.  HPI: The patient was admitted to [facility] from 02/11/2016 - 02/16/2016 after presenting with bilateral lower extremity swelling and shortness of breath.  Prior to admission, she denied any past history of anemia. Hemoglobin was normal in 2015. She has a history of the renal insufficiency (stage III chronic kidney disease).   Initial labs revealed a hematocrit of 26.1, hematoma 7.1, MCV 59, platelets 292,000, and white count 12,900. BUN was 39 with a creatinine of 3.08. Calcium was 8.3. Albumin was 3.1. Protein was 6.8. Urinalysis reveals greater than 500 mg/dL protein. Iron studies included a ferritin of 9 (low), iron saturation 3% (low) and TIBC 506 (elevated) consistent with iron deficiency anemia. B12 was 352 and folate 8.4. Guaiac was negative. TSH was 2.16 (normal). Hepatitis B and C serologies were negative. HIV testing was negative.  She has received 1 unit of packed red blood cells as well as a Feraheme (02/12/2016) for her iron deficiency anemia.  She underwent a laboratory workup for her chronic kidney disease. She underwent a left sided renal biopsy on 02/15/2016 (pathology pending). Echo on 02/12/2016 revealed an EF of 50-55%.  She notes a good diet. She eats meat regularly. She denies any melena or hematochezia. She has heavy menses. She has a period every 28 days lasting from 5-7 days. Menses are heavy requiring a pad and tampon about 6 times a day.  She denies any lightheadedness. Menses has been light since receiving DepoProvera.  She has had ice pica. She denies any excess bruising or bleeding. She has undergone a tonsillectomy without excess bleeding.  CBC on discharge included a hematocrit of 30.5,  hemoglobin 8.7, MCV 64.2, platelets 297,000, and WBC 12,800.  Symptomatically, she feels better.  She is slightly dizzy.  Menses are less.  She has a follow-up appointment with gynecology on 03/02/2016.  She denies any melena or hematochezia.   Past Medical History  Diagnosis Date  . Hypertension   . Asthma   . CKD (chronic kidney disease), stage III   . Anemia of chronic disease   . Morbid obesity [facility])     Past Surgical History  Procedure Laterality Date  . Tonsillectomy      Family History  Problem Relation Age of Onset  . Hypertension Mother   . Multiple sclerosis Mother   . Cancer Maternal Grandmother     Ovarian   . Cancer Maternal Grandfather     Prostate     Social History:  reports that she has never smoked. She has never used smokeless tobacco. She reports that she does not drink alcohol or use illicit drugs.  The patient is accompanied by her mother today.  Allergies: No Known Allergies  Current Medications: Current Outpatient Prescriptions  Medication Sig Dispense Refill  . aspirin EC 81 MG EC tablet Take 1 tablet (81 mg total) by mouth daily. (Patient taking differently: Take 81 mg by mouth as needed for pain. ) 30 tablet 2  . furosemide (LASIX) 40 MG tablet Take 1 tablet (40 mg total) by mouth 2 (two) times daily. 60 tablet 0  . carvedilol (COREG) 25 MG tablet Take 1 tablet (25 mg total) by mouth 2 (two) times daily with a meal. 60 tablet 5  . cloNIDine (CATAPRES) 0.1  MG tablet Take 1 tablet (0.1 mg total) by mouth 2 (two) times daily. 60 tablet 5  . enalapril (VASOTEC) 5 MG tablet Take 5 mg by mouth daily.    . Ferrous Sulfate (FERROUSUL PO) Take 1 tablet by mouth daily.    . isosorbide mononitrate (IMDUR) 30 MG 24 hr tablet Take 1 tablet (30 mg total) by mouth 2 (two) times daily. 60 tablet 5   No current facility-administered medications for this visit.    Review of Systems:  GENERAL:  Feels better.  No fevers, sweats or weight loss. PERFORMANCE  STATUS (ECOG):  1 HEENT:  No visual changes, runny nose, sore throat, mouth sores or tenderness. Lungs: No shortness of breath or cough.  No hemoptysis. Cardiac:  No chest pain, palpitations, orthopnea, or PND. GI:  No nausea, vomiting, diarrhea, constipation, melena or hematochezia. GU:  No urgency, frequency, dysuria, or hematuria.  Menses lighter. Musculoskeletal:  No back pain.  No joint pain.  No muscle tenderness. Extremities:  No pain or swelling. Skin:  No rashes or skin changes. Neuro:  Less dizzy.  No headache, numbness or weakness, balance or coordination issues. Endocrine:  No diabetes, thyroid issues, hot flashes or night sweats. Psych:  No mood changes, depression or anxiety. Pain:  No focal pain. Review of systems:  All other systems reviewed and found to be negative.   Physical Exam: Blood pressure 138/85, pulse 79, temperature 95.9 F (35.5 C), temperature source Tympanic, resp. rate 18, height 5' 6"  (1.676 m), weight 290 lb 14.4 oz (131.95 kg), last menstrual period 02/14/2016. GENERAL:  Well developed, well nourished, sitting comfortably in the exam room in no acute distress. MENTAL STATUS:  Alert and oriented to person, place and time. HEAD:  Brown hair.  Normocephalic, atraumatic, face symmetric, no Cushingoid features. EYES:  Brown eyes.  Pupils equal round and reactive to light and accomodation.  No conjunctivitis or scleral icterus. ENT:  Oropharynx clear without lesion.  Tongue normal. Mucous membranes moist.  RESPIRATORY:  Clear to auscultation without rales, wheezes or rhonchi. CARDIOVASCULAR:  Regular rate and rhythm without murmur, rub or gallop. ABDOMEN:  Soft, non-tender, with active bowel sounds, and no hepatosplenomegaly.  No masses. SKIN:  No rashes, ulcers or lesions. EXTREMITIES: No edema, no skin discoloration or tenderness.  No palpable cords. LYMPH NODES: No palpable cervical, supraclavicular, axillary or inguinal adenopathy  NEUROLOGICAL:  Unremarkable. PSYCH:  Appropriate.  Clinical Support on 02/18/2016  Component Date Value Ref Range Status  . WBC 02/18/2016 13.3* 3.6 - 11.0 K/uL Final  . RBC 02/18/2016 5.11  3.80 - 5.20 MIL/uL Final  . Hemoglobin 02/18/2016 9.8* 12.0 - 16.0 g/dL Final   RESULT REPEATED AND VERIFIED  . HCT 02/18/2016 33.0* 35.0 - 47.0 % Final   RESULT REPEATED AND VERIFIED  . MCV 02/18/2016 64.6* 80.0 - 100.0 fL Final  . MCH 02/18/2016 19.1* 26.0 - 34.0 pg Final  . MCHC 02/18/2016 29.6* 32.0 - 36.0 g/dL Final  . RDW 02/18/2016 27.0* 11.5 - 14.5 % Final  . Platelets 02/18/2016 301  150 - 440 K/uL Final  . Neutrophils Relative % 02/18/2016 79%   Final  . Neutro Abs 02/18/2016 10.5* 1.4 - 6.5 K/uL Final  . Lymphocytes Relative 02/18/2016 13%   Final  . Lymphs Abs 02/18/2016 1.8  1.0 - 3.6 K/uL Final  . Monocytes Relative 02/18/2016 5%   Final  . Monocytes Absolute 02/18/2016 0.7  0.2 - 0.9 K/uL Final  . Eosinophils Relative 02/18/2016 2%  Final  . Eosinophils Absolute 02/18/2016 0.3  0 - 0.7 K/uL Final  . Basophils Relative 02/18/2016 1%   Final  . Basophils Absolute 02/18/2016 0.1  0 - 0.1 K/uL Final  . Sodium 02/18/2016 138  135 - 145 mmol/L Final  . Potassium 02/18/2016 3.8  3.5 - 5.1 mmol/L Final  . Chloride 02/18/2016 105  101 - 111 mmol/L Final  . CO2 02/18/2016 24  22 - 32 mmol/L Final  . Glucose, Bld 02/18/2016 123* 65 - 99 mg/dL Final  . BUN 02/18/2016 51* 6 - 20 mg/dL Final  . Creatinine, Ser 02/18/2016 3.14* 0.44 - 1.00 mg/dL Final  . Calcium 02/18/2016 8.9  8.9 - 10.3 mg/dL Final  . Total Protein 02/18/2016 7.4  6.5 - 8.1 g/dL Final  . Albumin 02/18/2016 3.5  3.5 - 5.0 g/dL Final  . AST 02/18/2016 15  15 - 41 U/L Final  . ALT 02/18/2016 13* 14 - 54 U/L Final  . Alkaline Phosphatase 02/18/2016 65  38 - 126 U/L Final  . Total Bilirubin 02/18/2016 0.6  0.3 - 1.2 mg/dL Final  . GFR calc non Af Amer 02/18/2016 19* >60 mL/min Final  . GFR calc Af Amer 02/18/2016 22* >60 mL/min Final    Comment: (NOTE) The eGFR has been calculated using the CKD EPI equation. This calculation has not been validated in all clinical situations. eGFR's persistently <60 mL/min signify possible Chronic Kidney Disease.   . Anion gap 02/18/2016 9  5 - 15 Final  . Ferritin 02/18/2016 83  11 - 307 ng/mL Final  . Blood Bank Specimen 02/18/2016 SAMPLE AVAILABLE FOR TESTING   Final  . Sample Expiration 02/18/2016 02/21/2016   Final  Admission on 02/11/2016, Discharged on 02/16/2016  No results displayed because visit has over 200 results.      Assessment:  JOLAN MEALOR is a 30 y.o. female with renal insufficiency and iron deficiency anemia. Iron deficiency anemia is likely due to heavy menses. Diet is good. She denies melena or hematochezia.  Work-up on 02/11/2016 revealed iron deficiency anemia with a ferritin of 9 (low), iron saturation 3% (low) and TIBC 506 (elevated). MCV was 59. BUN was 39 with a creatinine of 3.08. Calcium was 8.3. Albumin was 3.1 and protein was 6.8. Urinalysis reveals greater than 500 mg/dL protein. Normal labs included: B12, folate, TSH, hepatitis B and C serologies, and HIV testing. Guaiac was negative.  She received Feraheme 510 mg IV on 02/12/2016.  She also has anemia of chronic kidney disease.Creatinine is 3.14 (CrCl 22 ml/min) today.  Renal biopsy on 02/15/2016 is pending.    Symptomatically, she is feeling better.  Exam is unremarkable.  Hematocrit has improved to 33.0.  Ferritin is 83.  Plan: 1.  Review hospitalization and diagnosis of iron deficiency anemia.  Discus plan for additional IV iron if needed. 2.  Discuss renal insufficiency.  Discuss potential need for Procrit in the future if anemia persists with adequate iron stores. 3.  Labs today:  CBC with diff, CMP, ferritin. 4.  Follow-up with gynecology on 03/02/2016. 5.  RTC in 1 month for MD assessment, labs (CBC with diff, ferritin 2 days before) and +/- Feraheme.   Lequita Asal, MD   02/18/2016

## 2016-03-25 ENCOUNTER — Inpatient Hospital Stay: Payer: Managed Care, Other (non HMO) | Attending: Hematology and Oncology

## 2016-03-25 DIAGNOSIS — N189 Chronic kidney disease, unspecified: Secondary | ICD-10-CM | POA: Insufficient documentation

## 2016-03-25 DIAGNOSIS — N269 Renal sclerosis, unspecified: Secondary | ICD-10-CM | POA: Insufficient documentation

## 2016-03-25 DIAGNOSIS — Z79899 Other long term (current) drug therapy: Secondary | ICD-10-CM | POA: Insufficient documentation

## 2016-03-25 DIAGNOSIS — D509 Iron deficiency anemia, unspecified: Secondary | ICD-10-CM | POA: Diagnosis present

## 2016-03-25 DIAGNOSIS — I129 Hypertensive chronic kidney disease with stage 1 through stage 4 chronic kidney disease, or unspecified chronic kidney disease: Secondary | ICD-10-CM | POA: Diagnosis not present

## 2016-03-25 LAB — CBC WITH DIFFERENTIAL/PLATELET
Basophils Absolute: 0 10*3/uL (ref 0–0.1)
Basophils Relative: 0 %
Eosinophils Absolute: 0.3 10*3/uL (ref 0–0.7)
Eosinophils Relative: 2 %
HCT: 33.3 % — ABNORMAL LOW (ref 35.0–47.0)
Hemoglobin: 10.4 g/dL — ABNORMAL LOW (ref 12.0–16.0)
Lymphocytes Relative: 19 %
Lymphs Abs: 2.4 10*3/uL (ref 1.0–3.6)
MCH: 21.8 pg — ABNORMAL LOW (ref 26.0–34.0)
MCHC: 31.3 g/dL — ABNORMAL LOW (ref 32.0–36.0)
MCV: 69.5 fL — ABNORMAL LOW (ref 80.0–100.0)
Monocytes Absolute: 0.7 10*3/uL (ref 0.2–0.9)
Monocytes Relative: 6 %
Neutro Abs: 9 10*3/uL — ABNORMAL HIGH (ref 1.4–6.5)
Neutrophils Relative %: 73 %
Platelets: 198 10*3/uL (ref 150–440)
RBC: 4.79 MIL/uL (ref 3.80–5.20)
RDW: 32.3 % — ABNORMAL HIGH (ref 11.5–14.5)
WBC: 12.5 10*3/uL — ABNORMAL HIGH (ref 3.6–11.0)

## 2016-03-25 LAB — FERRITIN: Ferritin: 33 ng/mL (ref 11–307)

## 2016-03-28 ENCOUNTER — Inpatient Hospital Stay: Payer: Managed Care, Other (non HMO)

## 2016-03-28 ENCOUNTER — Inpatient Hospital Stay (HOSPITAL_BASED_OUTPATIENT_CLINIC_OR_DEPARTMENT_OTHER): Payer: Managed Care, Other (non HMO) | Admitting: Hematology and Oncology

## 2016-03-28 ENCOUNTER — Other Ambulatory Visit: Payer: Self-pay | Admitting: Hematology and Oncology

## 2016-03-28 VITALS — BP 133/76 | HR 80 | Temp 97.3°F | Resp 17 | Ht 66.0 in | Wt 297.6 lb

## 2016-03-28 DIAGNOSIS — I129 Hypertensive chronic kidney disease with stage 1 through stage 4 chronic kidney disease, or unspecified chronic kidney disease: Secondary | ICD-10-CM

## 2016-03-28 DIAGNOSIS — Z79899 Other long term (current) drug therapy: Secondary | ICD-10-CM

## 2016-03-28 DIAGNOSIS — D509 Iron deficiency anemia, unspecified: Secondary | ICD-10-CM

## 2016-03-28 DIAGNOSIS — N189 Chronic kidney disease, unspecified: Secondary | ICD-10-CM

## 2016-03-28 DIAGNOSIS — N289 Disorder of kidney and ureter, unspecified: Secondary | ICD-10-CM

## 2016-03-28 DIAGNOSIS — N269 Renal sclerosis, unspecified: Secondary | ICD-10-CM | POA: Diagnosis not present

## 2016-03-28 NOTE — Progress Notes (Signed)
[place] [facility] day:  03/28/2016   Chief Complaint: [name] is a 30 y.o. female with chronic renal insufficiency and iron deficiency anemia who is seen for 1 month assessment.  HPI: The patient was last seen in the medical oncology clinic on 02/18/2016.  At that time, she was feeling better after her hospitalization.  Exam was unremarkable.  Hematocrit had improved to 33.0.  Ferritin was 83.  She has followed up with nephrology.   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  She states that she was put on another blood pressure pill.  During the interim, she has felt great.  Her energy level is good.  Her diet is "not healthy foods".  She has had no further menses since her Depo Provera shot.  She has a follow-up with gynecology next month.  She is taking 1 iron pill a day.  She denies any melena or hematochezia.   Past Medical History  Diagnosis Date  . Hypertension   . Asthma   . CKD (chronic kidney disease), stage III   . Anemia of chronic disease   . Morbid obesity [facility])     Past Surgical History  Procedure Laterality Date  . Tonsillectomy      Family History  Problem Relation Age of Onset  . Hypertension Mother   . Multiple sclerosis Mother   . Cancer Maternal Grandmother     Ovarian   . Cancer Maternal Grandfather     Prostate     Social History:  reports that she has never smoked. She has never used smokeless tobacco. She reports that she does not drink alcohol or use illicit drugs.  She is a Freight forwarder at [employer].  She describes working 126 hours in 2 weeks.  The patient is alone today.  Allergies: No Known Allergies  Current Medications: Current Outpatient Prescriptions  Medication Sig Dispense Refill  . aspirin EC 81 MG EC tablet Take 1 tablet (81 mg total) by mouth daily. (Patient taking differently: Take 81 mg by mouth as  needed for pain. ) 30 tablet 2  . carvedilol (COREG) 25 MG tablet Take 1 tablet (25 mg total) by mouth 2 (two) times daily with a meal. 60 tablet 5  . cloNIDine (CATAPRES) 0.1 MG tablet Take 1 tablet (0.1 mg total) by mouth 2 (two) times daily. 60 tablet 5  . enalapril (VASOTEC) 5 MG tablet Take 5 mg by mouth daily.    . Ferrous Sulfate (FERROUSUL PO) Take 1 tablet by mouth daily.    . furosemide (LASIX) 40 MG tablet Take 1 tablet (40 mg total) by mouth 2 (two) times daily. 60 tablet 0  . isosorbide mononitrate (IMDUR) 30 MG 24 hr tablet Take 1 tablet (30 mg total) by mouth 2 (two) times daily. 60 tablet 5   No current facility-administered medications for this visit.    Review of Systems:  GENERAL:  Feels great.  No fevers or sweats.  Weight up 7 pounds. PERFORMANCE STATUS (ECOG):  0 HEENT:  No visual changes, runny nose, sore throat, mouth sores or tenderness. Lungs: No shortness of breath or cough.  No hemoptysis. Cardiac:  No chest pain, palpitations, orthopnea, or PND. GI:  No nausea, vomiting, diarrhea, constipation, melena or hematochezia. GU:  No urgency, frequency, dysuria, or hematuria.  Menses lighter. Musculoskeletal:  No back pain.  No joint pain.  No muscle tenderness. Extremities:  No pain  or swelling. Skin:  No rashes or skin changes. Neuro:  No headache, numbness or weakness, balance or coordination issues. Endocrine:  No diabetes, thyroid issues, hot flashes or night sweats. Psych:  No mood changes, depression or anxiety. Pain:  No focal pain. Review of systems:  All other systems reviewed and found to be negative.  Physical Exam: Blood pressure 133/76, pulse 80, temperature 97.3 F (36.3 C), temperature source Tympanic, resp. rate 17, height 5\' 6"  (1.676 m), weight 297 lb 9.9 oz (135 kg). GENERAL:  Well developed, well nourished, woman sitting comfortably in the exam room in no acute distress. MENTAL STATUS:  Alert and oriented to person, place and time. HEAD:   Brown hair.  Normocephalic, atraumatic, face symmetric, no Cushingoid features. EYES:  Brown eyes.  Pupils equal round and reactive to light and accomodation.  No conjunctivitis or scleral icterus. ENT:  Oropharynx clear without lesion.  Tongue normal. Mucous membranes moist.  RESPIRATORY:  Clear to auscultation without rales, wheezes or rhonchi. CARDIOVASCULAR:  Regular rate and rhythm without murmur, rub or gallop. ABDOMEN:  Soft, non-tender, with active bowel sounds, and no hepatosplenomegaly.  No masses. SKIN:  No rashes, ulcers or lesions. EXTREMITIES: No edema, no skin discoloration or tenderness.  No palpable cords. LYMPH NODES: No palpable cervical, supraclavicular, axillary or inguinal adenopathy  NEUROLOGICAL: Unremarkable. PSYCH:  Appropriate.  No visits with results within 3 Day(s) from this visit. Latest known visit with results is:  Appointment on 03/25/2016  Component Date Value Ref Range Status  . WBC 03/25/2016 12.5* 3.6 - 11.0 K/uL Final  . RBC 03/25/2016 4.79  3.80 - 5.20 MIL/uL Final  . Hemoglobin 03/25/2016 10.4* 12.0 - 16.0 g/dL Final  . HCT 03/25/2016 33.3* 35.0 - 47.0 % Final  . MCV 03/25/2016 69.5* 80.0 - 100.0 fL Final  . MCH 03/25/2016 21.8* 26.0 - 34.0 pg Final  . MCHC 03/25/2016 31.3* 32.0 - 36.0 g/dL Final  . RDW 03/25/2016 32.3* 11.5 - 14.5 % Final  . Platelets 03/25/2016 198  150 - 440 K/uL Final  . Neutrophils Relative % 03/25/2016 73   Final  . Neutro Abs 03/25/2016 9.0* 1.4 - 6.5 K/uL Final  . Lymphocytes Relative 03/25/2016 19   Final  . Lymphs Abs 03/25/2016 2.4  1.0 - 3.6 K/uL Final  . Monocytes Relative 03/25/2016 6   Final  . Monocytes Absolute 03/25/2016 0.7  0.2 - 0.9 K/uL Final  . Eosinophils Relative 03/25/2016 2   Final  . Eosinophils Absolute 03/25/2016 0.3  0 - 0.7 K/uL Final  . Basophils Relative 03/25/2016 0   Final  . Basophils Absolute 03/25/2016 0.0  0 - 0.1 K/uL Final  . Ferritin 03/25/2016 33  11 - 307 ng/mL Final     Assessment:  [name] is a 30 y.o. female with renal insufficiency and iron deficiency anemia. Iron deficiency anemia is likely due to heavy menses. Diet is good. She denies melena or hematochezia.  Work-up on 02/11/2016 revealed iron deficiency anemia with a ferritin of 9 (low), iron saturation 3% (low) and TIBC 506 (elevated). MCV was 59. BUN was 39 with a creatinine of 3.08. Calcium was 8.3. Albumin was 3.1 and protein was 6.8. Urinalysis reveals greater than 500 mg/dL protein. Normal labs included: B12, folate, TSH, hepatitis B and C serologies, and HIV testing. Guaiac was negative.  She received Feraheme 510 mg IV on 02/12/2016.  She is taking oral iron.  She also has anemia of chronic kidney disease.Creatinine was 3.14 (CrCl  22 ml/min) on 02/18/2016.  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   Symptomatically, she feels good.  She has had no menses since Depo Provera.  Exam is unremarkable.  Hematocrit has improved to 33.3.  Ferritin is 33.  Plan: 1.  Review labs from 03/25/2016.  Discuss plan for additional IV iron if ferritin < 30.  Encourage iron rich foods.  Patient to take oral iron with OJ or vitamin C.  Discuss plan for recheck of labs in 1 month to ensure continued improvement. 2.  Discuss renal insufficiency.  Discuss potential need for Procrit in the future if anemia (HCT < 30 or Hgb < 10) persists with adequate iron stores (goal ferritin 100). 3.  Follow-up with gynecology next month as scheduled. 4.  RTC in 1 month for labs (CBC, ferritin). 5.  RTC in 2 months for MD assessment, labs (CBC with diff, ferritin drawn day before) and +/- Feraheme.   Lequita Asal, MD  03/28/2016, 3:01 PM

## 2016-03-28 NOTE — Progress Notes (Signed)
No changes since last visit. 

## 2016-03-30 ENCOUNTER — Encounter: Payer: Self-pay | Admitting: Hematology and Oncology

## 2016-04-07 ENCOUNTER — Ambulatory Visit: Payer: Managed Care, Other (non HMO) | Admitting: Cardiovascular Disease

## 2016-04-07 ENCOUNTER — Encounter: Payer: Self-pay | Admitting: *Deleted

## 2016-04-13 ENCOUNTER — Ambulatory Visit: Payer: Managed Care, Other (non HMO) | Admitting: Family

## 2016-04-14 ENCOUNTER — Encounter: Payer: Self-pay | Admitting: Family

## 2016-04-14 ENCOUNTER — Ambulatory Visit: Payer: Managed Care, Other (non HMO) | Attending: Family | Admitting: Family

## 2016-04-14 VITALS — BP 131/78 | HR 79 | Resp 18 | Ht 66.0 in | Wt 298.0 lb

## 2016-04-14 DIAGNOSIS — I130 Hypertensive heart and chronic kidney disease with heart failure and stage 1 through stage 4 chronic kidney disease, or unspecified chronic kidney disease: Secondary | ICD-10-CM | POA: Diagnosis present

## 2016-04-14 DIAGNOSIS — Z9889 Other specified postprocedural states: Secondary | ICD-10-CM | POA: Diagnosis not present

## 2016-04-14 DIAGNOSIS — Z79899 Other long term (current) drug therapy: Secondary | ICD-10-CM | POA: Insufficient documentation

## 2016-04-14 DIAGNOSIS — Z7982 Long term (current) use of aspirin: Secondary | ICD-10-CM | POA: Diagnosis not present

## 2016-04-14 DIAGNOSIS — I10 Essential (primary) hypertension: Secondary | ICD-10-CM

## 2016-04-14 DIAGNOSIS — N289 Disorder of kidney and ureter, unspecified: Secondary | ICD-10-CM

## 2016-04-14 DIAGNOSIS — J45909 Unspecified asthma, uncomplicated: Secondary | ICD-10-CM | POA: Diagnosis not present

## 2016-04-14 DIAGNOSIS — I5032 Chronic diastolic (congestive) heart failure: Secondary | ICD-10-CM | POA: Diagnosis present

## 2016-04-14 NOTE — Patient Instructions (Signed)
Continue weighing daily and call for an overnight weight gain of > 2 pounds or a weekly weight gain of >5 pounds. 

## 2016-04-14 NOTE — Progress Notes (Signed)
Subjective:    Patient ID: [name], female    DOB: 20-Apr-1986, 30 y.o.   MRN: [id number]  Congestive Heart Failure Presents for follow-up visit. The disease course has been stable. Pertinent negatives include no abdominal pain, chest pain, chest pressure, edema, fatigue, muscle weakness, orthopnea, palpitations or shortness of breath. The symptoms have been stable. Past treatments include beta blockers, salt and fluid restriction and ACE inhibitors. The treatment provided significant relief. Compliance with prior treatments has been good. Her past medical history is significant for anemia and HTN. There is no history of CVA or DM.  Hypertension This is a chronic problem. The current episode started more than 1 year ago. The problem is unchanged. The problem is controlled. Pertinent negatives include no chest pain, headaches, neck pain, palpitations, peripheral edema or shortness of breath. There are no associated agents to hypertension. Risk factors for coronary artery disease include family history, obesity and sedentary lifestyle. Past treatments include beta blockers, ACE inhibitors, diuretics and lifestyle changes. The current treatment provides significant improvement. Compliance problems include exercise.  Hypertensive end-organ damage includes kidney disease and heart failure.   Past Medical History  Diagnosis Date  . Hypertension   . Asthma   . CKD (chronic kidney disease), stage III   . Anemia of chronic disease   . Morbid obesity [facility])     Past Surgical History  Procedure Laterality Date  . Tonsillectomy      Family History  Problem Relation Age of Onset  . Hypertension Mother   . Multiple sclerosis Mother   . Cancer Maternal Grandmother     Ovarian   . Cancer Maternal Grandfather     Prostate     Social History  Substance Use Topics  . Smoking status: Never Smoker   . Smokeless tobacco: Never Used  . Alcohol Use: No    No Known Allergies  Prior to  Admission medications   Medication Sig Start Date End Date Taking? Authorizing Provider  aspirin EC 81 MG EC tablet Take 1 tablet (81 mg total) by mouth daily. Patient taking differently: Take 81 mg by mouth as needed for pain.  02/16/16  Yes Demetrios Loll, MD  carvedilol (COREG) 25 MG tablet Take 1 tablet (25 mg total) by mouth 2 (two) times daily with a meal. 03/10/16  Yes Alisa Graff, FNP  cloNIDine (CATAPRES) 0.1 MG tablet Take 1 tablet (0.1 mg total) by mouth 2 (two) times daily. 03/10/16  Yes Alisa Graff, FNP  enalapril (VASOTEC) 5 MG tablet Take 5 mg by mouth daily.   Yes Historical Provider, MD  Ferrous Sulfate (FERROUSUL PO) Take 1 tablet by mouth daily.   Yes Historical Provider, MD  furosemide (LASIX) 40 MG tablet Take 1 tablet (40 mg total) by mouth 2 (two) times daily. 02/16/16  Yes Demetrios Loll, MD  isosorbide mononitrate (IMDUR) 30 MG 24 hr tablet Take 1 tablet (30 mg total) by mouth 2 (two) times daily. 03/10/16  Yes Alisa Graff, FNP      Review of Systems  Constitutional: Negative for appetite change and fatigue.  HENT: Negative for congestion, postnasal drip and sore throat.   Eyes: Negative.   Respiratory: Negative for cough, chest tightness and shortness of breath.   Cardiovascular: Negative for chest pain, palpitations and leg swelling.  Gastrointestinal: Negative for abdominal pain and abdominal distention.  Endocrine: Negative.   Genitourinary: Negative.   Musculoskeletal: Negative for back pain, muscle weakness and neck pain.  Skin: Negative.  Allergic/Immunologic: Negative.   Neurological: Positive for dizziness (for a few minutes after taking medications). Negative for light-headedness and headaches.  Hematological: Negative for adenopathy. Does not bruise/bleed easily.  Psychiatric/Behavioral: Negative for sleep disturbance (sleeping on 2 pillows) and dysphoric mood. The patient is not nervous/anxious.        Objective:   Physical Exam  Constitutional: She is  oriented to person, place, and time. She appears well-developed and well-nourished.  HENT:  Head: Normocephalic and atraumatic.  Eyes: Conjunctivae are normal. Pupils are equal, round, and reactive to light.  Neck: Normal range of motion. Neck supple.  Cardiovascular: Normal rate and regular rhythm.   Pulmonary/Chest: Effort normal. She has no wheezes. She has no rales.  Abdominal: Soft. She exhibits no distension. There is no tenderness.  Musculoskeletal: She exhibits no edema or tenderness.  Neurological: She is alert and oriented to person, place, and time.  Skin: Skin is warm and dry.  Psychiatric: She has a normal mood and affect. Her behavior is normal. Thought content normal.  Nursing note and vitals reviewed.   BP 131/78 mmHg  Pulse 79  Resp 18  Ht 5\' 6"  (1.676 m)  Wt 298 lb (135.172 kg)  BMI 48.12 kg/m2  SpO2 100%  LMP 02/15/2016 (Within Days)       Assessment & Plan:  1: Chronic heart failure with preserved ejection fraction- Patient presents without any fatigue or shortness of breath (Class I). She is not having difficulties at work. She continues to weigh herself and says that her weight has been stable. By our scale, she's gained 6 pounds in the last month. She admits that she's really not getting any exercise and she was encouraged to start fitting some into her daily schedule. Reminded to call for an overnight weight gain of >2 pounds or a weekly weight gain of >5 pounds. She is not adding salt to her food and uses Mrs. Dash instead and also tries to eat low sodium foods. She is tolerating her medications without difficulty.  2: HTN- Blood pressure looks good today. Continue medications. She sees her PCP on 04/27/16. 3: Renal insufficiency- She says that she has to make a follow-up appointment with nephrology.   Patient did not bring her medications nor a list. Each medication was verbally reviewed with the patient and she was encouraged to bring the bottles to every  visit to confirm accuracy of list.  Return in 3 months or sooner for any questions/problems before then.

## 2016-04-27 ENCOUNTER — Inpatient Hospital Stay: Payer: Managed Care, Other (non HMO) | Attending: Hematology and Oncology

## 2016-05-05 ENCOUNTER — Telehealth: Payer: Self-pay | Admitting: Obstetrics and Gynecology

## 2016-05-05 NOTE — Telephone Encounter (Signed)
This pt called and said you saw her in the ER, she was give Depo inj on 4/30 before she was discharged. She no showed for her appt in May to see you and now wants an appt before the 29th of July b/c she needs her next depo. You don not have anything available nor dr Tennis Must, what do I need to advise pt to do?

## 2016-05-06 NOTE — Telephone Encounter (Signed)
Schedule her for a nurse only visit by July 29th to received Depo injection, and a f/u appt with me at next available time.  Please inform patient that it is important that she follows up with me as I would not be able to continue her Depo injections long term without follow up.

## 2016-05-17 NOTE — Telephone Encounter (Signed)
I COULD NOT REACH THIS PT, TRIED SEVERAL TIMES.

## 2016-05-17 NOTE — Telephone Encounter (Signed)
Ok.  Can we please send this patient a letter stating that we have tried to contact her several times.

## 2016-05-18 ENCOUNTER — Encounter: Payer: Self-pay | Admitting: Obstetrics and Gynecology

## 2016-05-18 NOTE — Telephone Encounter (Signed)
Letter was sent on 05/18/16. I informed pt we could not contact her. I ask her to call the office to get a fu appt. And give [place] a way to contact her.

## 2016-05-24 ENCOUNTER — Inpatient Hospital Stay: Payer: Managed Care, Other (non HMO) | Attending: Hematology and Oncology

## 2016-05-24 DIAGNOSIS — N269 Renal sclerosis, unspecified: Secondary | ICD-10-CM | POA: Insufficient documentation

## 2016-05-24 DIAGNOSIS — D509 Iron deficiency anemia, unspecified: Secondary | ICD-10-CM | POA: Insufficient documentation

## 2016-05-24 DIAGNOSIS — Z79899 Other long term (current) drug therapy: Secondary | ICD-10-CM | POA: Diagnosis not present

## 2016-05-24 DIAGNOSIS — D631 Anemia in chronic kidney disease: Secondary | ICD-10-CM | POA: Diagnosis not present

## 2016-05-24 DIAGNOSIS — I129 Hypertensive chronic kidney disease with stage 1 through stage 4 chronic kidney disease, or unspecified chronic kidney disease: Secondary | ICD-10-CM | POA: Diagnosis not present

## 2016-05-24 DIAGNOSIS — N289 Disorder of kidney and ureter, unspecified: Secondary | ICD-10-CM

## 2016-05-24 DIAGNOSIS — N189 Chronic kidney disease, unspecified: Secondary | ICD-10-CM | POA: Insufficient documentation

## 2016-05-24 LAB — FERRITIN: Ferritin: 29 ng/mL (ref 11–307)

## 2016-05-24 LAB — CBC WITH DIFFERENTIAL/PLATELET
Basophils Absolute: 0.1 10*3/uL (ref 0–0.1)
Basophils Relative: 1 %
Eosinophils Absolute: 0.4 10*3/uL (ref 0–0.7)
Eosinophils Relative: 3 %
HCT: 34.2 % — ABNORMAL LOW (ref 35.0–47.0)
Hemoglobin: 11.2 g/dL — ABNORMAL LOW (ref 12.0–16.0)
Lymphocytes Relative: 20 %
Lymphs Abs: 2.5 10*3/uL (ref 1.0–3.6)
MCH: 25.2 pg — ABNORMAL LOW (ref 26.0–34.0)
MCHC: 32.8 g/dL (ref 32.0–36.0)
MCV: 76.7 fL — ABNORMAL LOW (ref 80.0–100.0)
Monocytes Absolute: 0.7 10*3/uL (ref 0.2–0.9)
Monocytes Relative: 6 %
Neutro Abs: 8.6 10*3/uL — ABNORMAL HIGH (ref 1.4–6.5)
Neutrophils Relative %: 70 %
Platelets: 232 10*3/uL (ref 150–440)
RBC: 4.46 MIL/uL (ref 3.80–5.20)
RDW: 17.8 % — ABNORMAL HIGH (ref 11.5–14.5)
WBC: 12.2 10*3/uL — ABNORMAL HIGH (ref 3.6–11.0)

## 2016-05-25 NOTE — Progress Notes (Signed)
[facility] day:  05/26/16   Chief Complaint: [name] is a 30 y.o. female with chronic renal insufficiency and iron deficiency anemia who is seen for 2 month assessment.  HPI: The patient was last seen in the medical oncology clinic on 03/28/2016.  At that time, she felt good.  She had no menses since Depo Provera.  Exam was unremarkable.  Hematocrit had improved to 33.3.  Ferritin was 33.  We discussed continuation of oral iron.  We discussed IV iron if her ferritin decreased to < 30.  Labs on 05/24/2016 revealed a hematocrit of 34.2, hemoglobin 11.2, MCV 76.7, WBC12,200 and ANC of 8600.  Ferritin was 29.  During the interim, she has done "okay". She notes a 2 week period, described as "not as heavy", but "light".  She has not been on oral iron for a week secondary to nausea caused by oral iron.  She is scheduled tomorrow for a "shot" (DepoProvera).   Past Medical History:  Diagnosis Date  . Anemia of chronic disease   . Asthma   . CKD (chronic kidney disease), stage III   . Hypertension   . Morbid obesity ([place])     Past Surgical History:  Procedure Laterality Date  . TONSILLECTOMY      Family History  Problem Relation Age of Onset  . Hypertension Mother   . Multiple sclerosis Mother   . Cancer Maternal Grandmother     Ovarian   . Cancer Maternal Grandfather     Prostate     Social History:  reports that she has never smoked. She has never used smokeless tobacco. She reports that she does not drink alcohol or use drugs.  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  The patient is alone today.  Allergies: No Known Allergies  Current Medications: Current Outpatient Prescriptions  Medication Sig Dispense Refill  . aspirin EC 81 MG EC tablet Take 1 tablet (81 mg total) by mouth daily. (Patient taking differently: Take 81 mg by mouth as needed for pain. ) 30 tablet 2  . carvedilol (COREG) 25  MG tablet Take 1 tablet (25 mg total) by mouth 2 (two) times daily with a meal. 60 tablet 5  . cloNIDine (CATAPRES) 0.1 MG tablet Take 1 tablet (0.1 mg total) by mouth 2 (two) times daily. 60 tablet 5  . enalapril (VASOTEC) 5 MG tablet Take 5 mg by mouth daily.    . furosemide (LASIX) 40 MG tablet Take 1 tablet (40 mg total) by mouth 2 (two) times daily. 60 tablet 0  . isosorbide mononitrate (IMDUR) 30 MG 24 hr tablet Take 1 tablet (30 mg total) by mouth 2 (two) times daily. 60 tablet 5  . Ferrous Sulfate (FERROUSUL PO) Take 1 tablet by mouth daily.     No current facility-administered medications for this visit.     Review of Systems:  GENERAL:  Feels "ok".  No fevers or sweats.  Weight up 7 pounds. PERFORMANCE STATUS (ECOG):  0 HEENT:  No visual changes, runny nose, sore throat, mouth sores or tenderness. Lungs: No shortness of breath or cough.  No hemoptysis. Cardiac:  No chest pain, palpitations, orthopnea, or PND. GI:  Nausea caused by oral iron.  No vomiting, diarrhea, constipation, melena or hematochezia. GU:  No urgency, frequency, dysuria, or hematuria.  Two week menses described as "light". Musculoskeletal:  No back pain.  No joint pain.  No muscle tenderness. Extremities:  No pain or swelling. Skin:  No rashes or skin changes. Neuro:  No headache, numbness or weakness, balance or coordination issues. Endocrine:  No diabetes, thyroid issues, hot flashes or night sweats. Psych:  No mood changes, depression or anxiety. Pain:  No focal pain. Review of systems:  All other systems reviewed and found to be negative.  Physical Exam: Blood pressure (!) 141/91, pulse 78, temperature 97.2 F (36.2 C), temperature source Tympanic, resp. rate 18, weight (!) 306 lb 15.9 oz (139.3 kg). GENERAL:  Well developed, well nourished, woman sitting comfortably in the exam room in no acute distress. MENTAL STATUS:  Alert and oriented to person, place and time. HEAD:  Brown hair pulled back.   Normocephalic, atraumatic, face symmetric, no Cushingoid features. EYES:  Brown eyes.  Pupils equal round and reactive to light and accomodation.  No conjunctivitis or scleral icterus. ENT:  Oropharynx clear without lesion.  Tongue normal. Mucous membranes moist.  RESPIRATORY:  Clear to auscultation without rales, wheezes or rhonchi. CARDIOVASCULAR:  Regular rate and rhythm without murmur, rub or gallop. ABDOMEN:  Soft, non-tender, with active bowel sounds, and no hepatosplenomegaly.  No masses. SKIN:  No rashes, ulcers or lesions. EXTREMITIES: No edema, no skin discoloration or tenderness.  No palpable cords. LYMPH NODES: No palpable cervical, supraclavicular, axillary or inguinal adenopathy  NEUROLOGICAL: Unremarkable. PSYCH:  Appropriate.   Appointment on 05/24/2016  Component Date Value Ref Range Status  . WBC 05/24/2016 12.2* 3.6 - 11.0 K/uL Final  . RBC 05/24/2016 4.46  3.80 - 5.20 MIL/uL Final  . Hemoglobin 05/24/2016 11.2* 12.0 - 16.0 g/dL Final  . HCT 05/24/2016 34.2* 35.0 - 47.0 % Final  . MCV 05/24/2016 76.7* 80.0 - 100.0 fL Final  . MCH 05/24/2016 25.2* 26.0 - 34.0 pg Final  . MCHC 05/24/2016 32.8  32.0 - 36.0 g/dL Final  . RDW 05/24/2016 17.8* 11.5 - 14.5 % Final  . Platelets 05/24/2016 232  150 - 440 K/uL Final  . Neutrophils Relative % 05/24/2016 70  % Final  . Neutro Abs 05/24/2016 8.6* 1.4 - 6.5 K/uL Final  . Lymphocytes Relative 05/24/2016 20  % Final  . Lymphs Abs 05/24/2016 2.5  1.0 - 3.6 K/uL Final  . Monocytes Relative 05/24/2016 6  % Final  . Monocytes Absolute 05/24/2016 0.7  0.2 - 0.9 K/uL Final  . Eosinophils Relative 05/24/2016 3  % Final  . Eosinophils Absolute 05/24/2016 0.4  0 - 0.7 K/uL Final  . Basophils Relative 05/24/2016 1  % Final  . Basophils Absolute 05/24/2016 0.1  0 - 0.1 K/uL Final  . Ferritin 05/24/2016 29  11 - 307 ng/mL Final    Assessment:  [name] is a 30 y.o. female with renal insufficiency and iron deficiency anemia. Iron  deficiency anemia is likely due to heavy menses. Diet is good. She denies melena or hematochezia.  Work-up on 02/11/2016 revealed iron deficiency anemia with a ferritin of 9 (low), iron saturation 3% (low) and TIBC 506 (elevated). MCV was 59. BUN was 39 with a creatinine of 3.08. Calcium was 8.3. Albumin was 3.1 and protein was 6.8. Urinalysis reveals greater than 500 mg/dL protein. Normal labs included: B12, folate, TSH, hepatitis B and C serologies, and HIV testing. Guaiac was negative.  She received Feraheme 510 mg IV on 02/12/2016.  She has not taken oral iron in 1 week secondary to nausea.  She has anemia of chronic kidney disease.Creatinine was 3.14 (CrCl 22 ml/min) on  02/18/2016.  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   Symptomatically, she feels "ok".  She has had a 2 week light menses.  Exam is unremarkable.  Hematocrit is 34.2.  Ferritin is 29.  Plan: 1.  Review labs from 05/24/2016.  Discuss consideration of additional IV iron if ferritin < 30.  Encourage iron rich foods.  Consider combination pill (vitamin C + oral iron).   2.  Review issues with renal insufficiency and anemia.  Discuss potential need for Procrit in the future if anemia (HCT < 30 or Hgb < 10) persists with adequate iron stores (goal ferritin 100). 3.  RTC in 6 weeks for labs (CBC with diff, ferritin, creatinine). 4.  RTC in 3 months for MD assessment, labs (CBC with diff, ferritin- day before) and +/- Feraheme.   Lequita Asal, MD  05/26/2016, 11:04 AM

## 2016-05-26 ENCOUNTER — Inpatient Hospital Stay (HOSPITAL_BASED_OUTPATIENT_CLINIC_OR_DEPARTMENT_OTHER): Payer: Managed Care, Other (non HMO) | Admitting: Hematology and Oncology

## 2016-05-26 ENCOUNTER — Inpatient Hospital Stay: Payer: Managed Care, Other (non HMO)

## 2016-05-26 ENCOUNTER — Other Ambulatory Visit: Payer: Self-pay | Admitting: Hematology and Oncology

## 2016-05-26 VITALS — BP 141/91 | HR 78 | Temp 97.2°F | Resp 18 | Wt 307.0 lb

## 2016-05-26 DIAGNOSIS — D631 Anemia in chronic kidney disease: Secondary | ICD-10-CM | POA: Diagnosis not present

## 2016-05-26 DIAGNOSIS — I129 Hypertensive chronic kidney disease with stage 1 through stage 4 chronic kidney disease, or unspecified chronic kidney disease: Secondary | ICD-10-CM

## 2016-05-26 DIAGNOSIS — N269 Renal sclerosis, unspecified: Secondary | ICD-10-CM

## 2016-05-26 DIAGNOSIS — N189 Chronic kidney disease, unspecified: Secondary | ICD-10-CM

## 2016-05-26 DIAGNOSIS — D509 Iron deficiency anemia, unspecified: Secondary | ICD-10-CM | POA: Diagnosis not present

## 2016-05-26 DIAGNOSIS — Z79899 Other long term (current) drug therapy: Secondary | ICD-10-CM

## 2016-05-26 DIAGNOSIS — D6489 Other specified anemias: Secondary | ICD-10-CM

## 2016-05-26 NOTE — Progress Notes (Signed)
Patient is here for folow up, she has not taken her iron in the past week says it makes her feel sick.

## 2016-05-27 DIAGNOSIS — D649 Anemia, unspecified: Secondary | ICD-10-CM | POA: Insufficient documentation

## 2016-06-03 ENCOUNTER — Telehealth: Payer: Self-pay | Admitting: *Deleted

## 2016-06-03 MED ORDER — IRON-VITAMIN C 65-125 MG PO TABS: 1.0000 | ORAL_TABLET | Freq: Every day | ORAL | 1 refills | Status: DC

## 2016-06-03 NOTE — Telephone Encounter (Signed)
Patient informed that Rx for Vitron has been sent ot pharmacy for her

## 2016-06-03 NOTE — Telephone Encounter (Signed)
  Let's try Vitron  M

## 2016-06-03 NOTE — Telephone Encounter (Signed)
Called to say that an iron pill was to be called in but no one said where it was sent. I see no documentation that anything was called in. Please advise

## 2016-06-28 ENCOUNTER — Encounter: Payer: Self-pay | Admitting: Obstetrics and Gynecology

## 2016-07-07 ENCOUNTER — Inpatient Hospital Stay: Payer: Managed Care, Other (non HMO) | Attending: Hematology and Oncology

## 2016-07-15 ENCOUNTER — Encounter: Payer: Self-pay | Admitting: Family

## 2016-07-15 ENCOUNTER — Ambulatory Visit: Payer: Managed Care, Other (non HMO) | Admitting: Family

## 2016-07-15 ENCOUNTER — Telehealth: Payer: Self-pay | Admitting: Family

## 2016-07-15 NOTE — Telephone Encounter (Signed)
Patient did not show for her [facility] appointment on 07/15/16. Will attempt to reschedule.

## 2016-07-24 ENCOUNTER — Encounter: Payer: Self-pay | Admitting: Hematology and Oncology

## 2016-07-28 ENCOUNTER — Inpatient Hospital Stay: Payer: Managed Care, Other (non HMO) | Attending: Hematology and Oncology

## 2016-07-28 DIAGNOSIS — N189 Chronic kidney disease, unspecified: Secondary | ICD-10-CM | POA: Insufficient documentation

## 2016-07-28 DIAGNOSIS — I129 Hypertensive chronic kidney disease with stage 1 through stage 4 chronic kidney disease, or unspecified chronic kidney disease: Secondary | ICD-10-CM | POA: Insufficient documentation

## 2016-07-28 DIAGNOSIS — Z79899 Other long term (current) drug therapy: Secondary | ICD-10-CM | POA: Diagnosis not present

## 2016-07-28 DIAGNOSIS — D631 Anemia in chronic kidney disease: Secondary | ICD-10-CM | POA: Diagnosis not present

## 2016-07-28 DIAGNOSIS — N269 Renal sclerosis, unspecified: Secondary | ICD-10-CM | POA: Diagnosis not present

## 2016-07-28 DIAGNOSIS — D509 Iron deficiency anemia, unspecified: Secondary | ICD-10-CM | POA: Insufficient documentation

## 2016-07-28 DIAGNOSIS — D6489 Other specified anemias: Secondary | ICD-10-CM

## 2016-07-28 LAB — CBC WITH DIFFERENTIAL/PLATELET
Basophils Absolute: 0.1 10*3/uL (ref 0–0.1)
Basophils Relative: 1 %
Eosinophils Absolute: 0.4 10*3/uL (ref 0–0.7)
Eosinophils Relative: 3 %
HCT: 37.2 % (ref 35.0–47.0)
Hemoglobin: 12 g/dL (ref 12.0–16.0)
Lymphocytes Relative: 20 %
Lymphs Abs: 2.9 10*3/uL (ref 1.0–3.6)
MCH: 25.8 pg — ABNORMAL LOW (ref 26.0–34.0)
MCHC: 32.4 g/dL (ref 32.0–36.0)
MCV: 79.6 fL — ABNORMAL LOW (ref 80.0–100.0)
Monocytes Absolute: 1 10*3/uL — ABNORMAL HIGH (ref 0.2–0.9)
Monocytes Relative: 7 %
Neutro Abs: 10.2 10*3/uL — ABNORMAL HIGH (ref 1.4–6.5)
Neutrophils Relative %: 69 %
Platelets: 213 10*3/uL (ref 150–440)
RBC: 4.68 MIL/uL (ref 3.80–5.20)
RDW: 17.3 % — ABNORMAL HIGH (ref 11.5–14.5)
WBC: 14.6 10*3/uL — ABNORMAL HIGH (ref 3.6–11.0)

## 2016-07-28 LAB — CREATININE, SERUM
Creatinine, Ser: 2.26 mg/dL — ABNORMAL HIGH (ref 0.44–1.00)
GFR calc Af Amer: 32 mL/min — ABNORMAL LOW (ref >60)
GFR calc non Af Amer: 28 mL/min — ABNORMAL LOW (ref >60)

## 2016-07-28 LAB — FERRITIN: Ferritin: 20 ng/mL (ref 11–307)

## 2016-08-26 ENCOUNTER — Inpatient Hospital Stay: Payer: Managed Care, Other (non HMO) | Attending: Hematology and Oncology | Admitting: Hematology and Oncology

## 2016-08-26 ENCOUNTER — Encounter: Payer: Self-pay | Admitting: Hematology and Oncology

## 2016-08-26 ENCOUNTER — Inpatient Hospital Stay: Payer: Managed Care, Other (non HMO)

## 2016-08-26 ENCOUNTER — Telehealth: Payer: Self-pay | Admitting: *Deleted

## 2016-08-26 VITALS — BP 128/82 | HR 81 | Resp 18 | Wt 310.6 lb

## 2016-08-26 DIAGNOSIS — D509 Iron deficiency anemia, unspecified: Secondary | ICD-10-CM

## 2016-08-26 DIAGNOSIS — Z79899 Other long term (current) drug therapy: Secondary | ICD-10-CM | POA: Insufficient documentation

## 2016-08-26 DIAGNOSIS — I129 Hypertensive chronic kidney disease with stage 1 through stage 4 chronic kidney disease, or unspecified chronic kidney disease: Secondary | ICD-10-CM | POA: Diagnosis not present

## 2016-08-26 DIAGNOSIS — J45909 Unspecified asthma, uncomplicated: Secondary | ICD-10-CM | POA: Insufficient documentation

## 2016-08-26 DIAGNOSIS — D631 Anemia in chronic kidney disease: Secondary | ICD-10-CM

## 2016-08-26 DIAGNOSIS — Z7982 Long term (current) use of aspirin: Secondary | ICD-10-CM | POA: Insufficient documentation

## 2016-08-26 DIAGNOSIS — N183 Chronic kidney disease, stage 3 (moderate): Secondary | ICD-10-CM

## 2016-08-26 DIAGNOSIS — N920 Excessive and frequent menstruation with regular cycle: Secondary | ICD-10-CM | POA: Diagnosis not present

## 2016-08-26 DIAGNOSIS — D6489 Other specified anemias: Secondary | ICD-10-CM

## 2016-08-26 DIAGNOSIS — N289 Disorder of kidney and ureter, unspecified: Secondary | ICD-10-CM

## 2016-08-26 LAB — CBC WITH DIFFERENTIAL/PLATELET
Basophils Absolute: 0 10*3/uL (ref 0–0.1)
Basophils Relative: 0 %
Eosinophils Absolute: 0.4 10*3/uL (ref 0–0.7)
Eosinophils Relative: 3 %
HCT: 38.7 % (ref 35.0–47.0)
Hemoglobin: 12.7 g/dL (ref 12.0–16.0)
Lymphocytes Relative: 21 %
Lymphs Abs: 2.5 10*3/uL (ref 1.0–3.6)
MCH: 26.5 pg (ref 26.0–34.0)
MCHC: 32.7 g/dL (ref 32.0–36.0)
MCV: 81 fL (ref 80.0–100.0)
Monocytes Absolute: 0.8 10*3/uL (ref 0.2–0.9)
Monocytes Relative: 7 %
Neutro Abs: 8.4 10*3/uL — ABNORMAL HIGH (ref 1.4–6.5)
Neutrophils Relative %: 69 %
Platelets: 169 10*3/uL (ref 150–440)
RBC: 4.77 MIL/uL (ref 3.80–5.20)
RDW: 16.8 % — ABNORMAL HIGH (ref 11.5–14.5)
WBC: 12.2 10*3/uL — ABNORMAL HIGH (ref 3.6–11.0)

## 2016-08-26 LAB — FERRITIN: Ferritin: 24 ng/mL (ref 11–307)

## 2016-08-26 NOTE — Progress Notes (Signed)
[facility] day:  08/26/16   Chief Complaint: [name] is a 30 y.o. female with chronic renal insufficiency and iron deficiency anemia who is seen for 3 month assessment.  HPI: The patient was last seen in the medical oncology clinic on 05/26/2016.  At that time, she felt "ok".  She had a 2 week light menses.  Exam was unremarkable.  Hematocrit was 34.2.  Ferritin was 29.  A prescription for Vitron was sent to the pharmacy.  Labs on 07/28/2016 revealed a hematocrit of 37.2, hemoglobin 12.0, MCV 79.6, platelets 213,000, WBC 14,600.  Ferritin was 20.  During the interim, she has done well.  She continues Depo Provera every 3 months (next 08/2016).  She goes to see her nephrologist next month.  Her enalapril was increased.  She describes her diet as"not good".  She eats a lot of fast food.     Past Medical History:  Diagnosis Date  . Anemia of chronic disease   . Asthma   . CKD (chronic kidney disease), stage III   . Hypertension   . Morbid obesity ([place])     Past Surgical History:  Procedure Laterality Date  . TONSILLECTOMY      Family History  Problem Relation Age of Onset  . Hypertension Mother   . Multiple sclerosis Mother   . Cancer Maternal Grandmother     Ovarian   . Cancer Maternal Grandfather     Prostate     Social History:  reports that she has never smoked. She has never used smokeless tobacco. She reports that she does not drink alcohol or use drugs.  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  Her hours have improved.  The patient is alone today.  Allergies: No Known Allergies  Current Medications: Current Outpatient Prescriptions  Medication Sig Dispense Refill  . aspirin EC 81 MG EC tablet Take 1 tablet (81 mg total) by mouth daily. (Patient taking differently: Take 81 mg by mouth as needed for pain. ) 30 tablet 2  . carvedilol (COREG) 25 MG tablet Take 1 tablet (25 mg total)  by mouth 2 (two) times daily with a meal. 60 tablet 5  . enalapril (VASOTEC) 5 MG tablet Take 5 mg by mouth daily.    . furosemide (LASIX) 40 MG tablet Take 1 tablet (40 mg total) by mouth 2 (two) times daily. 60 tablet 0  . Iron-Vitamin C 65-125 MG TABS Take 1 capsule by mouth daily. 30 tablet 1  . isosorbide mononitrate (IMDUR) 30 MG 24 hr tablet Take 1 tablet (30 mg total) by mouth 2 (two) times daily. 60 tablet 5   No current facility-administered medications for this visit.     Review of Systems:  GENERAL:  Feels "good".  No fevers or sweats.  Weight up 4 pounds. PERFORMANCE STATUS (ECOG):  0 HEENT:  No visual changes, runny nose, sore throat, mouth sores or tenderness. Lungs: No shortness of breath or cough.  No hemoptysis. Cardiac:  No chest pain, palpitations, orthopnea, or PND. GI:  No vomiting, diarrhea, constipation, melena or hematochezia. GU:  No urgency, frequency, dysuria, or hematuria.  Depo Provera every 3 months.. Musculoskeletal:  No back pain.  No joint pain.  No muscle tenderness. Extremities:  No pain or swelling. Skin:  No rashes or skin changes. Neuro:  No headache, numbness or weakness, balance or coordination issues. Endocrine:  No diabetes, thyroid  issues, hot flashes or night sweats. Psych:  No mood changes, depression or anxiety. Pain:  No focal pain. Review of systems:  All other systems reviewed and found to be negative.  Physical Exam: Blood pressure 128/82, pulse 81, resp. rate 18, weight (!) 310 lb 10.1 oz (140.9 kg). GENERAL:  Well developed, well nourished, heavyset woman sitting comfortably in the exam room in no acute distress. MENTAL STATUS:  Alert and oriented to person, place and time. HEAD:  Brown hair pulled back.  Normocephalic, atraumatic, face symmetric, no Cushingoid features. EYES:  Brown eyes.  Pupils equal round and reactive to light and accomodation.  No conjunctivitis or scleral icterus. ENT:  Oropharynx clear without lesion.  Tongue  normal. Mucous membranes moist.  RESPIRATORY:  Clear to auscultation without rales, wheezes or rhonchi. CARDIOVASCULAR:  Regular rate and rhythm without murmur, rub or gallop. ABDOMEN:  Soft, non-tender, with active bowel sounds, and no hepatosplenomegaly.  No masses. SKIN:  No rashes, ulcers or lesions. EXTREMITIES: No edema, no skin discoloration or tenderness.  No palpable cords. LYMPH NODES: No palpable cervical, supraclavicular, axillary or inguinal adenopathy  NEUROLOGICAL: Unremarkable. PSYCH:  Appropriate.   Appointment on 08/26/2016  Component Date Value Ref Range Status  . WBC 08/26/2016 12.2* 3.6 - 11.0 K/uL Final  . RBC 08/26/2016 4.77  3.80 - 5.20 MIL/uL Final  . Hemoglobin 08/26/2016 12.7  12.0 - 16.0 g/dL Final  . HCT 08/26/2016 38.7  35.0 - 47.0 % Final  . MCV 08/26/2016 81.0  80.0 - 100.0 fL Final  . MCH 08/26/2016 26.5  26.0 - 34.0 pg Final  . MCHC 08/26/2016 32.7  32.0 - 36.0 g/dL Final  . RDW 08/26/2016 16.8* 11.5 - 14.5 % Final  . Platelets 08/26/2016 169  150 - 440 K/uL Final  . Neutrophils Relative % 08/26/2016 69  % Final  . Neutro Abs 08/26/2016 8.4* 1.4 - 6.5 K/uL Final  . Lymphocytes Relative 08/26/2016 21  % Final  . Lymphs Abs 08/26/2016 2.5  1.0 - 3.6 K/uL Final  . Monocytes Relative 08/26/2016 7  % Final  . Monocytes Absolute 08/26/2016 0.8  0.2 - 0.9 K/uL Final  . Eosinophils Relative 08/26/2016 3  % Final  . Eosinophils Absolute 08/26/2016 0.4  0 - 0.7 K/uL Final  . Basophils Relative 08/26/2016 0  % Final  . Basophils Absolute 08/26/2016 0.0  0 - 0.1 K/uL Final    Assessment:  [name] is a 30 y.o. female with renal insufficiency and iron deficiency anemia. Iron deficiency anemia was likely due to heavy menses. Diet is good. She denies melena or hematochezia.  Menses are now light on Depo Provera.  Work-up on 02/11/2016 revealed iron deficiency anemia with a ferritin of 9 (low), iron saturation 3% (low) and TIBC 506 (elevated). MCV  was 59. BUN was 39 with a creatinine of 3.08. Calcium was 8.3. Albumin was 3.1 and protein was 6.8. Urinalysis reveals greater than 500 mg/dL protein. Normal labs included: B12, folate, TSH, hepatitis B and C serologies, and HIV testing. Guaiac was negative.  She received Feraheme 510 mg IV on 02/12/2016.  She does not take oral iron secondary to nausea.  She has anemia of chronic kidney disease.Creatinine was 3.14 (CrCl 22 ml/min) on 02/18/2016.  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   Symptomatically, she feels "good".  Menses are light on Depo Provera.  Exam is unremarkable.  Hematocrit has improved from 34.2 to 38.7.  Ferritin is 24.  Plan: 1.  Labs today:  CBC with diff, ferritin, iron studies. 2.  Discuss diet issues and iron rich foods.  Postpone IV iron as MCV normal with continued improvement in hematocrit and no blood loss (menses). 3.  No Feraheme today. 4.  RTC in 3 months for labs (CBC with diff, ferritin, creatinine). 5.  RTC in 6 months for MD assessment, labs (CBC with diff, ferritin) and +/- Feraheme.   Lequita Asal, MD  08/26/2016, 11:14 AM

## 2016-08-26 NOTE — Telephone Encounter (Signed)
-----   Message from Lequita Asal, MD sent at 08/26/2016 12:45 PM EST ----- Regarding: Please call patient  With ferritin level  M  ----- Message ----- From: Interface, Lab In [place] Sent: 08/26/2016  10:17 AM To: Lequita Asal, MD

## 2016-08-26 NOTE — Telephone Encounter (Signed)
Called patient to inform her that her ferritin level is improving.  Patient verbalized understanding and thanked me for calling.

## 2016-08-27 LAB — IRON AND TIBC
Iron: 51 ug/dL (ref 28–170)
Saturation Ratios: 14 % (ref 10.4–31.8)
TIBC: 359 ug/dL (ref 250–450)
UIBC: 308 ug/dL

## 2016-11-25 ENCOUNTER — Inpatient Hospital Stay: Payer: Managed Care, Other (non HMO) | Attending: Hematology and Oncology

## 2016-12-16 ENCOUNTER — Inpatient Hospital Stay: Payer: Managed Care, Other (non HMO) | Attending: Hematology and Oncology

## 2016-12-17 IMAGING — US US RENAL
1 series · 14 of 25 positions shown · non-contrast
Comparison: None in PACs

CLINICAL DATA: Acute renal failure

EXAM:
RENAL / URINARY TRACT ULTRASOUND COMPLETE

[Series 1: us renal · 0.23mm/px · 14 of 39 slices shown]
[im 1/39]
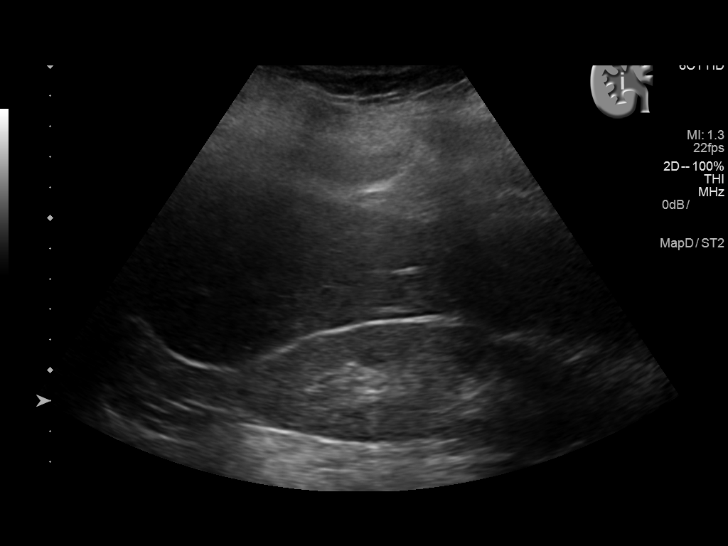
[im 4/39]
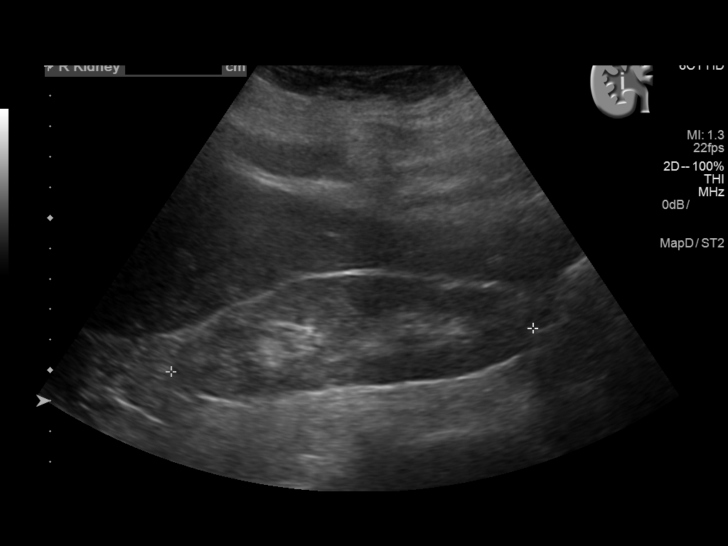
[im 7/39]
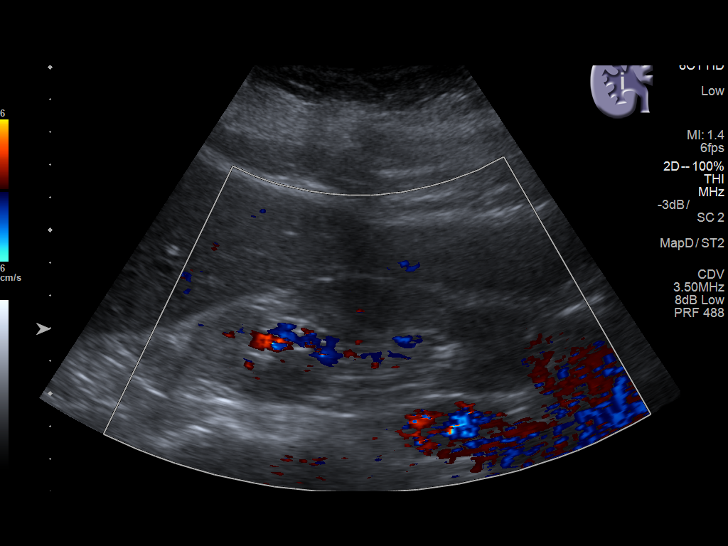
[im 10/39]
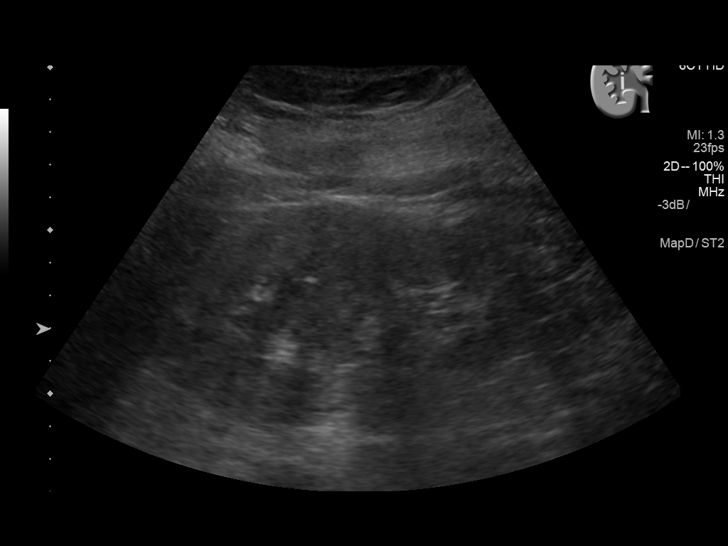
[im 13/39]
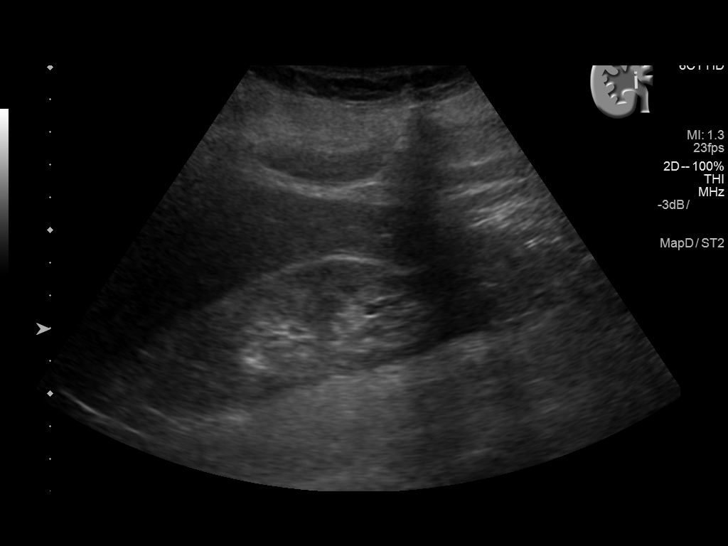
[im 15/39]
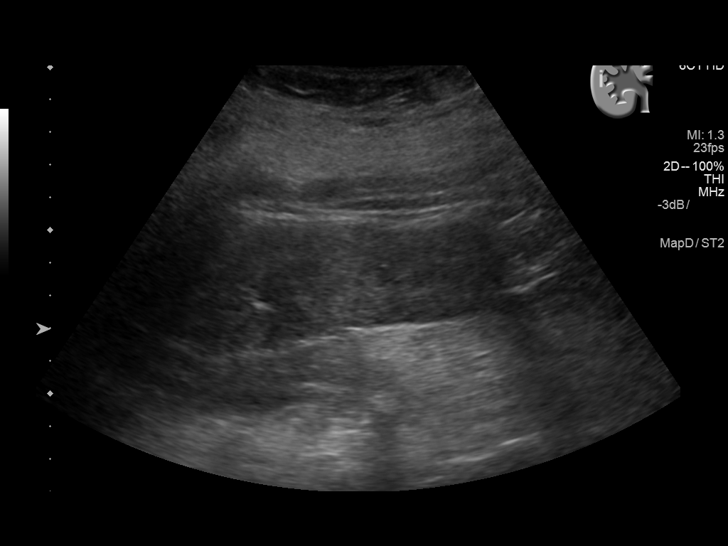
[im 18/39]
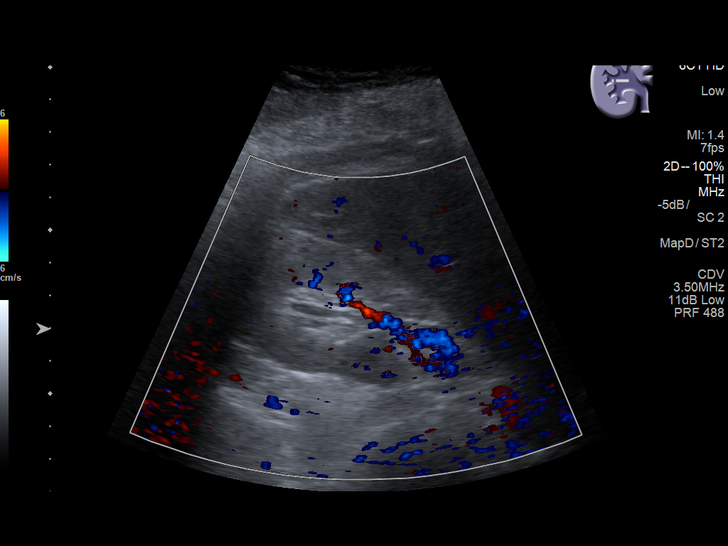
[im 21/39]
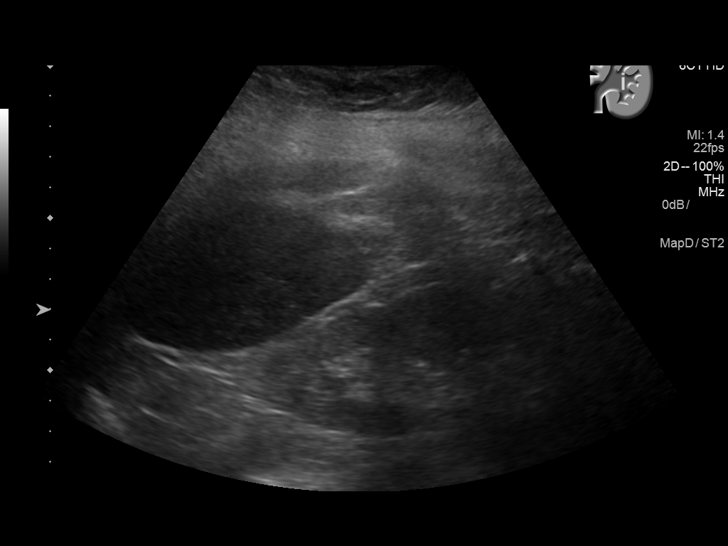
[im 24/39]
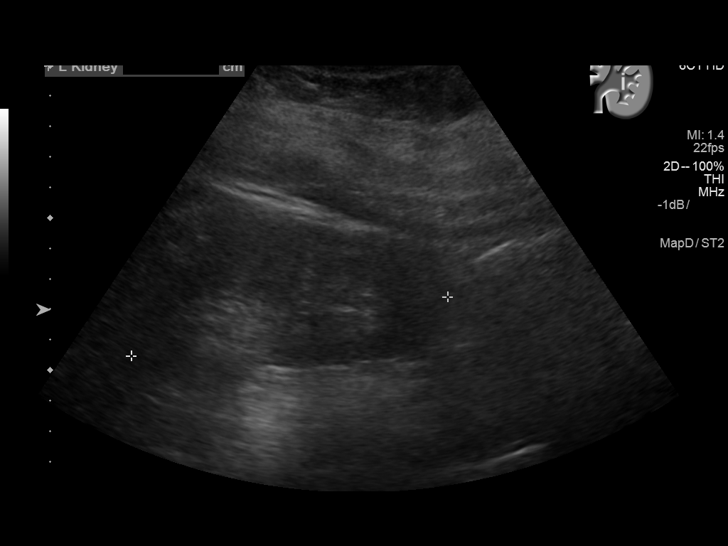
[im 26/39]
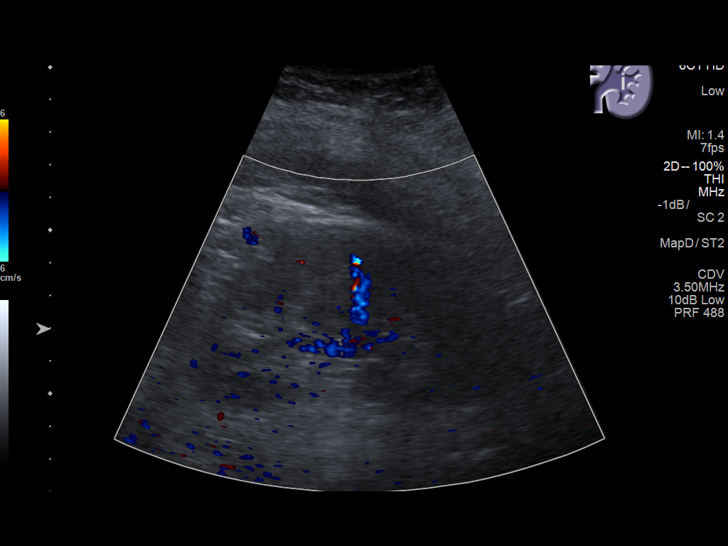
[im 29/39]
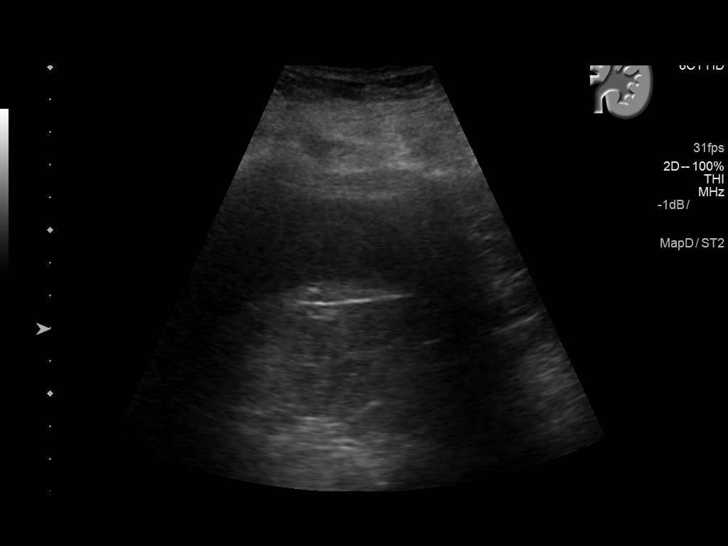
[im 32/39]
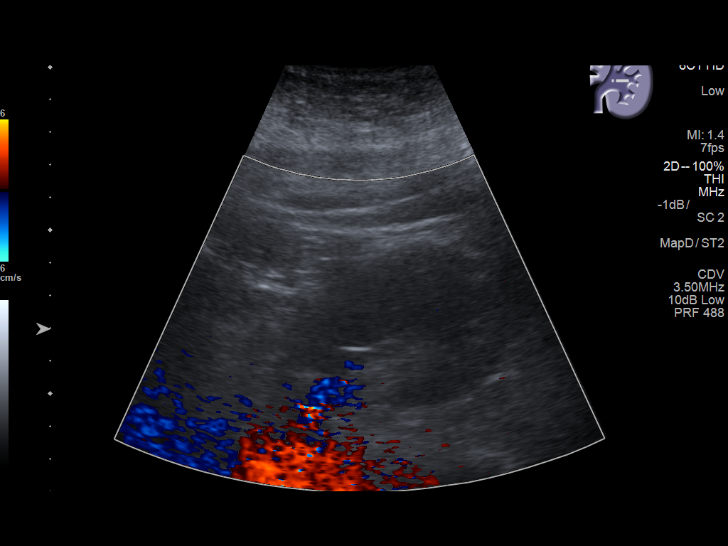
[im 35/39]
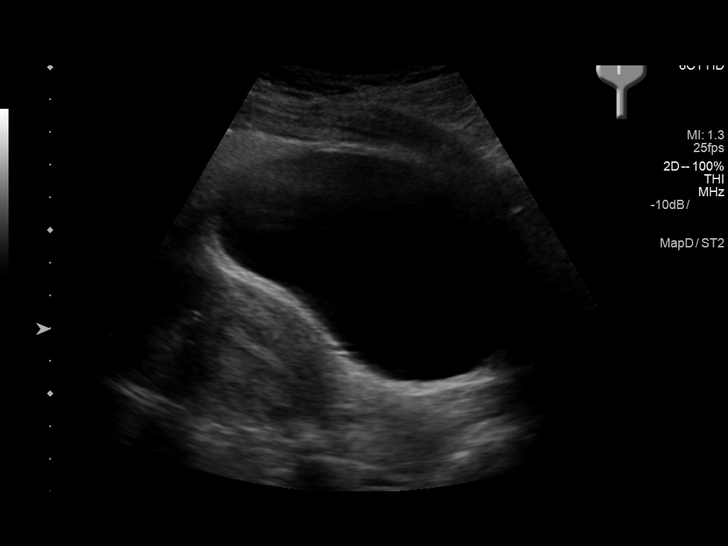
[im 39/39]
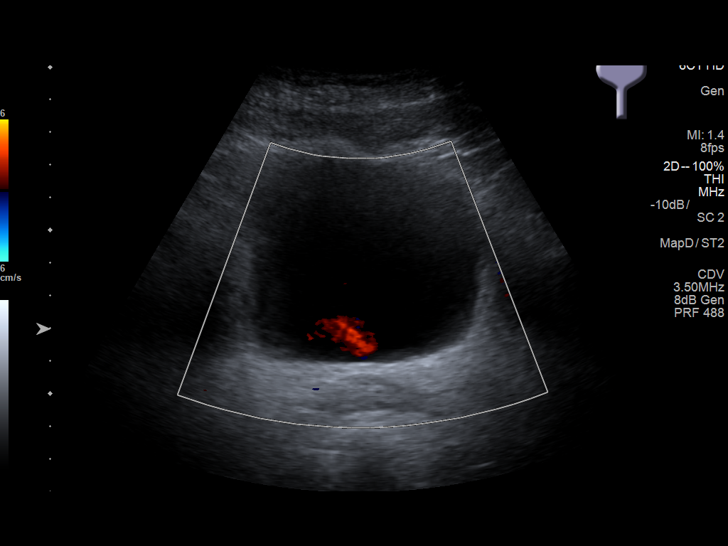

[14 of 25 positions shown; findings below may reference images not displayed]

FINDINGS: Right Kidney:

Length: 12.1 cm. The renal cortical echotexture is increased and
greater than that of the adjacent liver. There is no hydronephrosis
nor discrete mass.

Left Kidney:

Length: 10.6 cm. The cortical echotexture is increased. There is no
hydronephrosis. There is no discrete mass.

Bladder:

The partially distended urinary bladder is normal in appearance.
Bilateral ureteral jets are observed.
IMPRESSION: Increased renal cortical echotexture consistent with medical renal
disease. There is no evidence of obstruction.

## 2016-12-20 IMAGING — US US PELVIS COMPLETE
1 series · 14 of 25 positions shown · non-contrast
Comparison: [DATE]

CLINICAL DATA: Menorrhagia x2 years



[Series 1: us pelvis complete · 0.22mm/px · 14 of 144 slices shown]
[im 1/144]
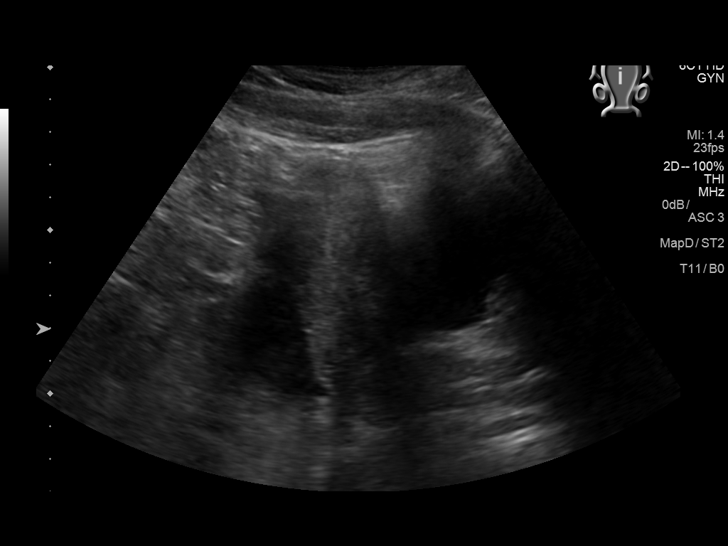
[im 12/144]
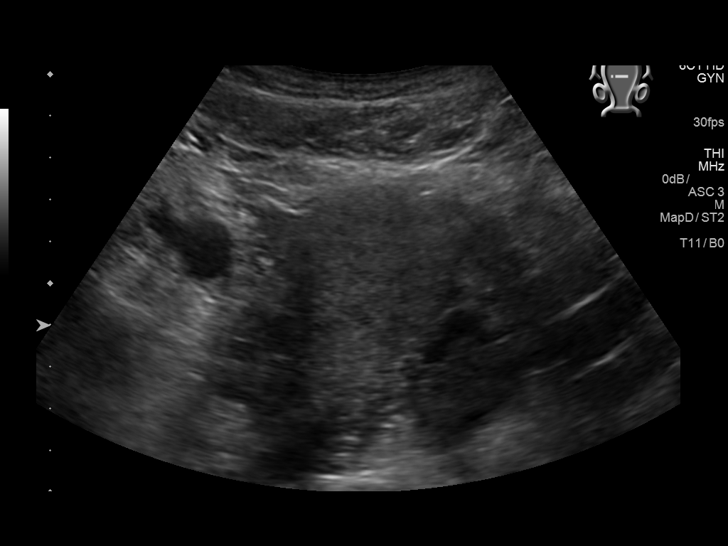
[im 24/144]
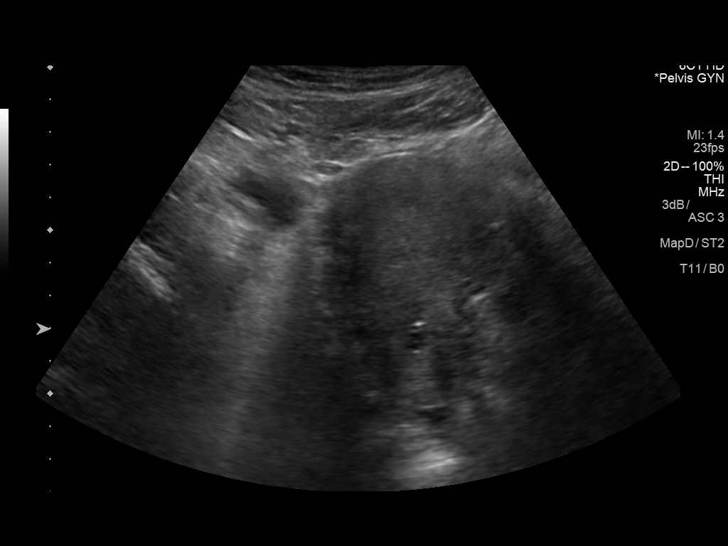
[im 36/144]
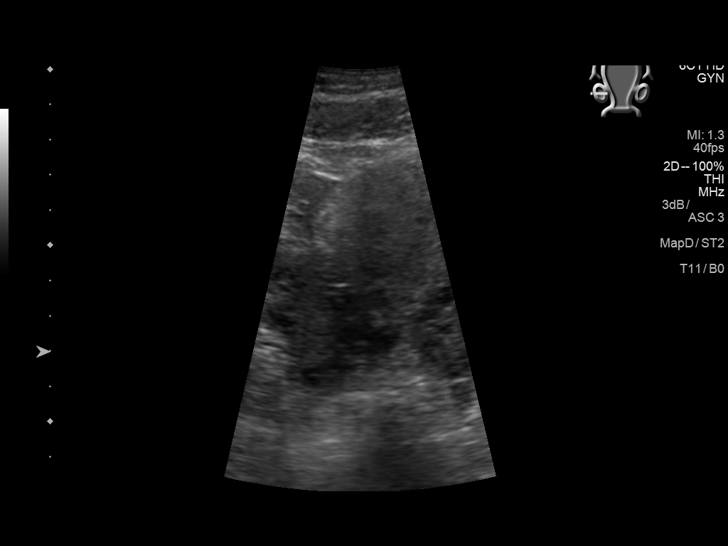
[im 48/144]
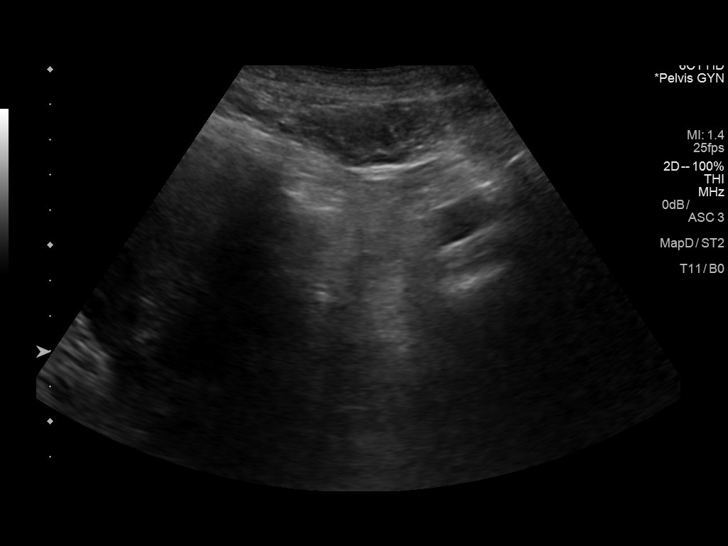
[im 54/144]
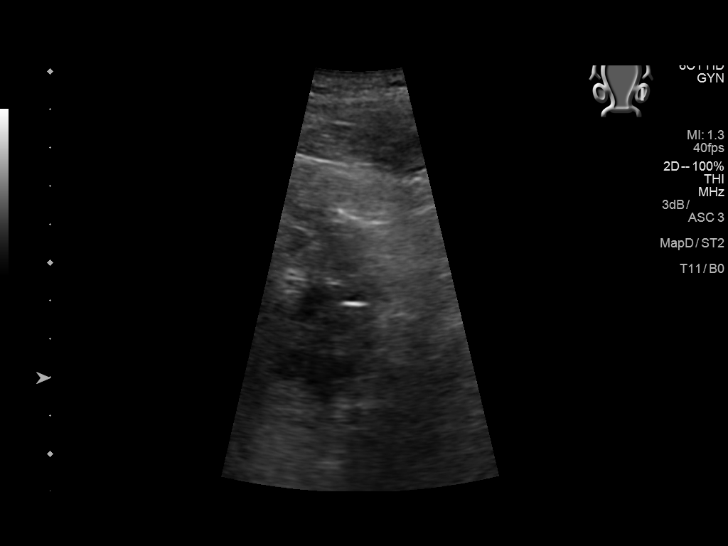
[im 66/144]
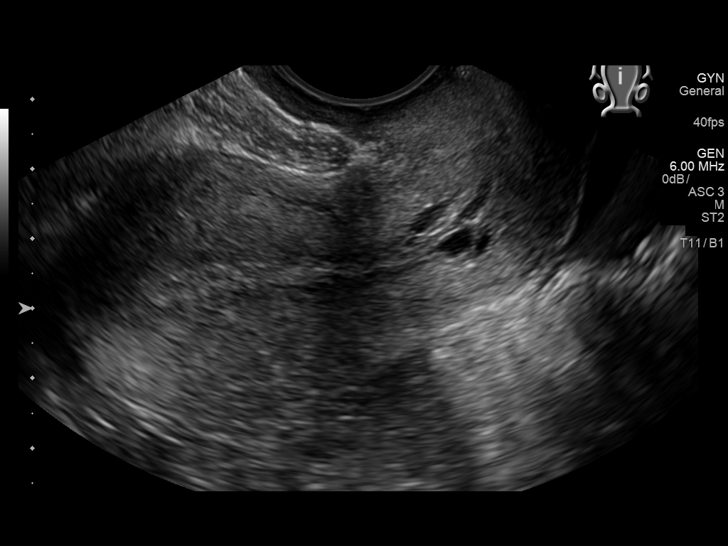
[im 78/144]
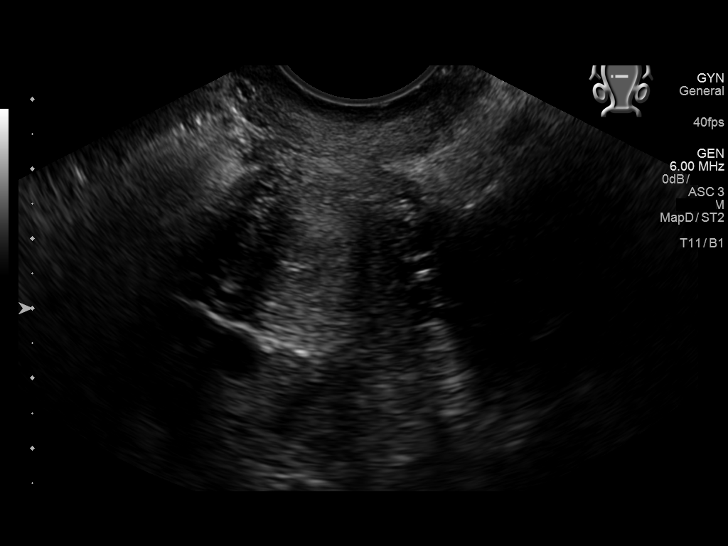
[im 90/144]
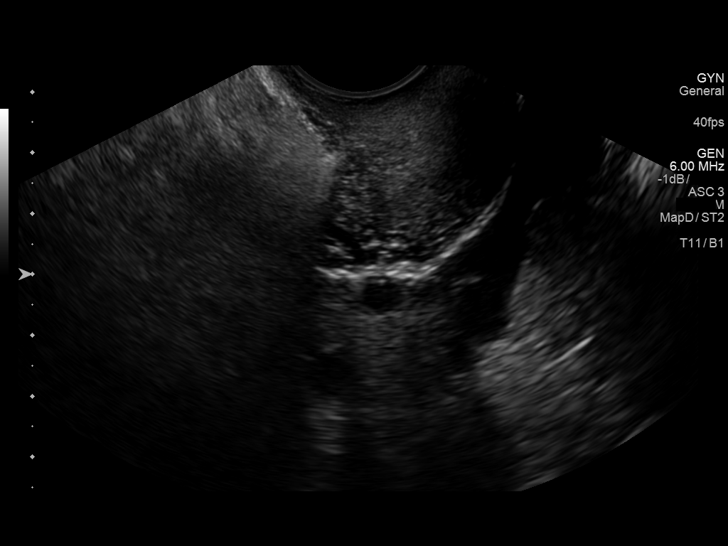
[im 96/144]
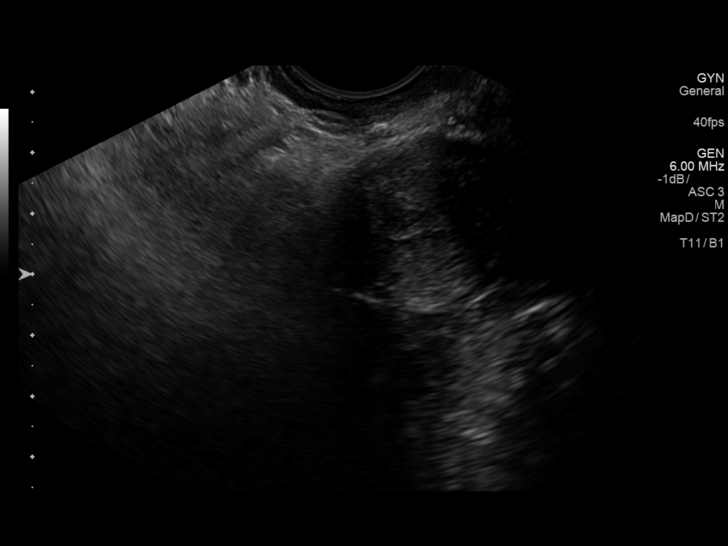
[im 108/144]
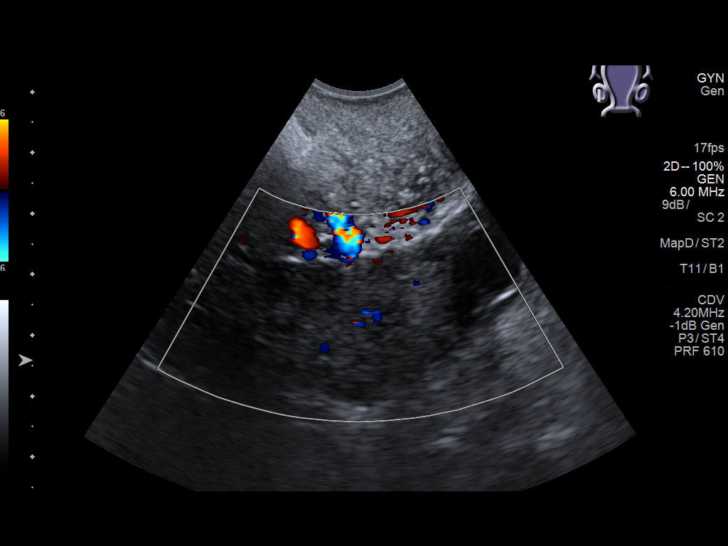
[im 120/144]
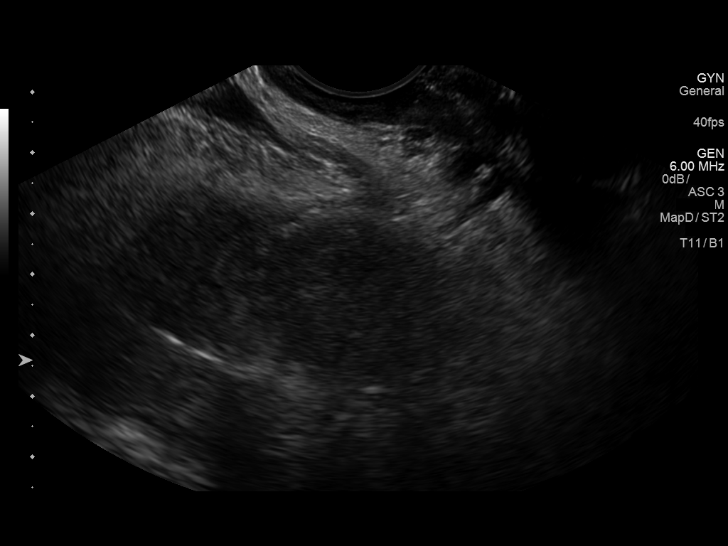
[im 132/144]
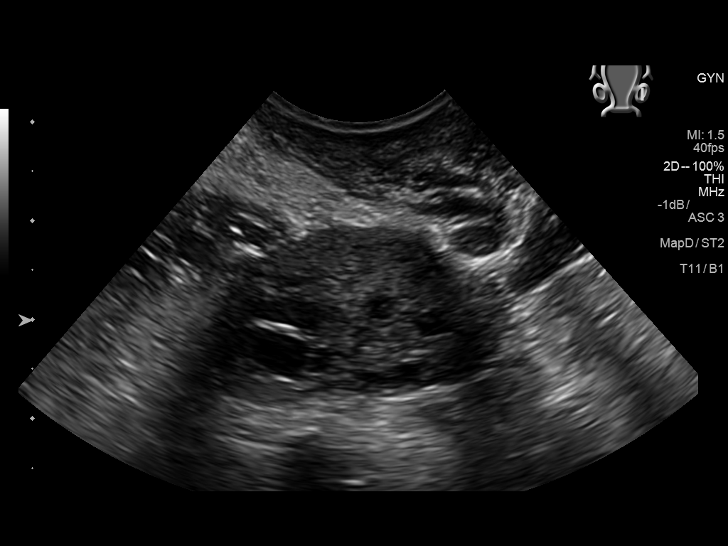
[im 144/144]
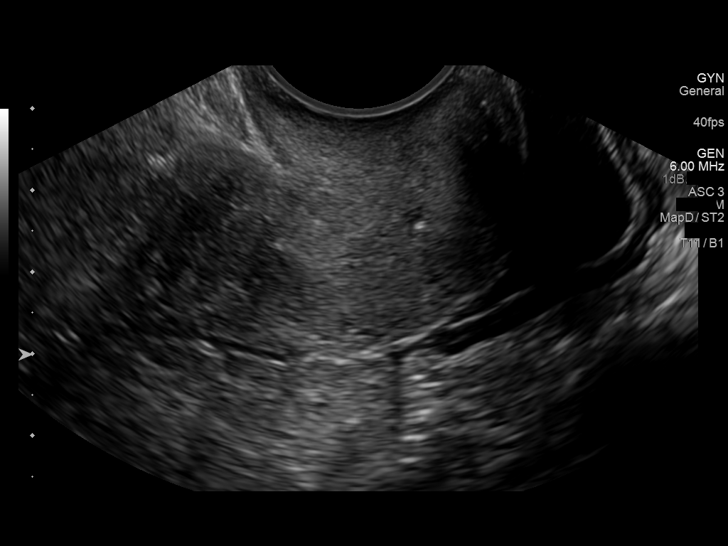

[14 of 25 positions shown; findings below may reference images not displayed]

FINDINGS: Uterus

Measurements: 7.8 x 3.8 x 4.6 cm. No fibroids or other mass
visualized.

Endometrium

Thickness: 4 mm.  No focal abnormality visualized.

Right ovary

Measurements: 4.3 x 3.4 x 2.7 cm. Normal appearance/no adnexal mass.

Left ovary

Measurements: 3.3 x 1.6 x 1.9 cm. Normal appearance/no adnexal mass.

Other findings

Small volume pelvic ascites.
IMPRESSION: Negative pelvic ultrasound.

## 2017-01-05 ENCOUNTER — Other Ambulatory Visit: Payer: Self-pay | Admitting: Family

## 2017-01-12 ENCOUNTER — Encounter: Payer: Self-pay | Admitting: Family

## 2017-01-12 ENCOUNTER — Ambulatory Visit: Payer: 59 | Attending: Family | Admitting: Family

## 2017-01-12 VITALS — BP 181/100 | HR 85 | Resp 18 | Ht 66.0 in | Wt 322.1 lb

## 2017-01-12 DIAGNOSIS — D631 Anemia in chronic kidney disease: Secondary | ICD-10-CM | POA: Diagnosis not present

## 2017-01-12 DIAGNOSIS — N183 Chronic kidney disease, stage 3 (moderate): Secondary | ICD-10-CM | POA: Insufficient documentation

## 2017-01-12 DIAGNOSIS — Z6841 Body Mass Index (BMI) 40.0 and over, adult: Secondary | ICD-10-CM | POA: Insufficient documentation

## 2017-01-12 DIAGNOSIS — I5032 Chronic diastolic (congestive) heart failure: Secondary | ICD-10-CM | POA: Diagnosis present

## 2017-01-12 DIAGNOSIS — I130 Hypertensive heart and chronic kidney disease with heart failure and stage 1 through stage 4 chronic kidney disease, or unspecified chronic kidney disease: Secondary | ICD-10-CM | POA: Insufficient documentation

## 2017-01-12 DIAGNOSIS — Z820 Family history of epilepsy and other diseases of the nervous system: Secondary | ICD-10-CM | POA: Diagnosis not present

## 2017-01-12 DIAGNOSIS — I10 Essential (primary) hypertension: Secondary | ICD-10-CM

## 2017-01-12 DIAGNOSIS — Z8041 Family history of malignant neoplasm of ovary: Secondary | ICD-10-CM | POA: Diagnosis not present

## 2017-01-12 DIAGNOSIS — Z8249 Family history of ischemic heart disease and other diseases of the circulatory system: Secondary | ICD-10-CM | POA: Diagnosis not present

## 2017-01-12 DIAGNOSIS — D509 Iron deficiency anemia, unspecified: Secondary | ICD-10-CM

## 2017-01-12 DIAGNOSIS — Z8042 Family history of malignant neoplasm of prostate: Secondary | ICD-10-CM | POA: Insufficient documentation

## 2017-01-12 DIAGNOSIS — Z7982 Long term (current) use of aspirin: Secondary | ICD-10-CM | POA: Insufficient documentation

## 2017-01-12 DIAGNOSIS — Z9889 Other specified postprocedural states: Secondary | ICD-10-CM | POA: Diagnosis not present

## 2017-01-12 DIAGNOSIS — N289 Disorder of kidney and ureter, unspecified: Secondary | ICD-10-CM

## 2017-01-12 MED ORDER — ISOSORBIDE MONONITRATE ER 30 MG PO TB24: 30.0000 mg | ORAL_TABLET | Freq: Two times a day (BID) | ORAL | 5 refills | Status: DC

## 2017-01-12 MED ORDER — CARVEDILOL 25 MG PO TABS: 25.0000 mg | ORAL_TABLET | Freq: Two times a day (BID) | ORAL | 5 refills | Status: DC

## 2017-01-12 NOTE — Patient Instructions (Signed)
Continue weighing daily and call for an overnight weight gain of > 2 pounds or a weekly weight gain of >5 pounds. 

## 2017-01-12 NOTE — Progress Notes (Signed)
Patient ID: [name], female    DOB: November 08, 1985, 31 y.o.   MRN: [id number]  HPI  [name] is a 31 y/o female with a history of HTN, CKD, morbid obesity, asthma, anemia and chronic heart failure.   Last echo was done 02/12/16 and showed an EF of 50-55%.  Last admitted 02/11/16 with acute exacerbation of her HF. Initially treated with IV diuretics and transitioned to oral diuretics. Received 2 doses of IV iron due to anemia along with a blood transfusion. Nephrology consult was done. Discharged home.   She presents today for her follow-up visit although hasn't been seen since June 2017. She denies any fatigue or shortness of breath upon exertion or at rest. She does notice occasional edema in her lower legs but she says that it depends on how much travelling she is doing. Does try to elevate them. Has been weighing daily and reports a gradual weight gain. Has been out of her carvedilol and isosorbide for the last week as she's been out of state.  Past Medical History:  Diagnosis Date  . Anemia of chronic disease   . Asthma   . CKD (chronic kidney disease), stage III   . Hypertension   . Morbid obesity ([place])    Past Surgical History:  Procedure Laterality Date  . TONSILLECTOMY     Family History  Problem Relation Age of Onset  . Hypertension Mother   . Multiple sclerosis Mother   . Cancer Maternal Grandmother     Ovarian   . Cancer Maternal Grandfather     Prostate    Social History  Substance Use Topics  . Smoking status: Never Smoker  . Smokeless tobacco: Never Used  . Alcohol use No   No Known Allergies Prior to Admission medications   Medication Sig Start Date End Date Taking? Authorizing Provider  aspirin EC 81 MG EC tablet Take 1 tablet (81 mg total) by mouth daily. Patient taking differently: Take 81 mg by mouth as needed for pain.  02/16/16  Yes Demetrios Loll, MD  enalapril (VASOTEC) 5 MG tablet Take 10 mg by mouth daily.    Yes Historical Provider, MD  furosemide  (LASIX) 40 MG tablet Take 1 tablet (40 mg total) by mouth 2 (two) times daily. 02/16/16  Yes Demetrios Loll, MD  Iron-Vitamin C 65-125 MG TABS Take 1 capsule by mouth daily. 06/03/16  Yes Lequita Asal, MD  carvedilol (COREG) 25 MG tablet Take 1 tablet (25 mg total) by mouth 2 (two) times daily with a meal. 01/12/17   Alisa Graff, FNP  isosorbide mononitrate (IMDUR) 30 MG 24 hr tablet Take 1 tablet (30 mg total) by mouth 2 (two) times daily. 01/12/17   Alisa Graff, FNP      Review of Systems  Constitutional: Negative for appetite change and fatigue.  HENT: Negative for congestion, postnasal drip and sore throat.   Eyes: Negative.   Respiratory: Negative for chest tightness and shortness of breath.   Cardiovascular: Positive for leg swelling (at times). Negative for chest pain and palpitations.  Gastrointestinal: Negative for abdominal distention and abdominal pain.  Endocrine: Negative.   Genitourinary: Negative.   Musculoskeletal: Negative for back pain and neck pain.  Skin: Negative.   Allergic/Immunologic: Negative.   Neurological: Negative for dizziness and light-headedness.  Hematological: Negative for adenopathy. Does not bruise/bleed easily.  Psychiatric/Behavioral: Negative for dysphoric mood, sleep disturbance (sleeping on 2 pillows) and suicidal ideas. The patient is not nervous/anxious.  Vitals:   01/12/17 1116  BP: (!) 181/100  Pulse: 85  Resp: 18  SpO2: 100%  Weight: (!) 322 lb 2 oz (146.1 kg)  Height: 5\' 6"  (1.676 m)   Wt Readings from Last 3 Encounters:  01/12/17 (!) 322 lb 2 oz (146.1 kg)  08/26/16 (!) 310 lb 10.1 oz (140.9 kg)  05/26/16 (!) 306 lb 15.9 oz (139.3 kg)   Lab Results  Component Value Date   CREATININE 2.26 (H) 07/28/2016   CREATININE 3.14 (H) 02/18/2016   CREATININE 2.94 (H) 02/16/2016    Physical Exam  Constitutional: She is oriented to person, place, and time. She appears well-developed and well-nourished.  HENT:  Head: Normocephalic  and atraumatic.  Eyes: Conjunctivae are normal. Pupils are equal, round, and reactive to light.  Neck: Normal range of motion. Neck supple. No JVD present.  Cardiovascular: Normal rate and regular rhythm.   Pulmonary/Chest: Effort normal. She has no wheezes. She has no rales.  Abdominal: Soft. She exhibits no distension. There is no tenderness.  Musculoskeletal: She exhibits edema (trace pitting edmea in bilateral lower legs). She exhibits no tenderness.  Neurological: She is alert and oriented to person, place, and time.  Skin: Skin is warm and dry.  Psychiatric: She has a normal mood and affect. Her behavior is normal. Thought content normal.  Nursing note and vitals reviewed.    Assessment & Plan:  1: Chronic heart failure with preserved ejection fraction-  - NYHA class II - euvolemic - weigh daily. Has gained 24 pounds since she was last here June 2017. Encouraged her to increase her activity. Reminded her to call for an overnight weight gain of >2 pounds or a weekly weight gain of >5 pounds - not adding salt to her food but admits that she's been recently eating saltier foods as she was out of town for work and staying in a hotel. Now back to eating her regular foods - needs to make an appointment with cardiologist Rockey Situ). She says that she'll call. If not not done by next visit, will make the appointment for her.  2: HTN-  - BP elevated today but she's been out of carvedilol and isosorbide for the last week - refills provided today and discussed the importance of not running out of medications even if she's out of town - to check her BP at home daily to make sure that it comes down - needs to make an appointment with PCP (Reeves)  3: Renal insufficiency-  - seeing nephrologist Candiss Norse) and returns to him 02/09/17  4: Anemia- - being followed at the cancer center Mike Gip) and saw her 08/26/16  Patient did not bring her medications nor a list. Each medication was  verbally reviewed with the patient and she was encouraged to bring the bottles to every visit to confirm accuracy of list.  Return here in 3 months or sooner for any questions/problems before then.

## 2017-02-24 ENCOUNTER — Other Ambulatory Visit: Payer: Managed Care, Other (non HMO)

## 2017-02-24 ENCOUNTER — Ambulatory Visit: Payer: Managed Care, Other (non HMO) | Admitting: Hematology and Oncology

## 2017-03-10 ENCOUNTER — Other Ambulatory Visit: Payer: 59

## 2017-03-10 ENCOUNTER — Ambulatory Visit: Payer: Self-pay | Admitting: Hematology and Oncology

## 2017-03-17 ENCOUNTER — Inpatient Hospital Stay: Payer: 59 | Admitting: Hematology and Oncology

## 2017-03-17 ENCOUNTER — Inpatient Hospital Stay: Payer: 59

## 2017-03-17 NOTE — Progress Notes (Deleted)
[facility] day:  03/17/17   Chief Complaint: [name] is a 31 y.o. female with chronic renal insufficiency and iron deficiency anemia who is seen for 6 month assessment.  HPI: The patient was last seen in the medical oncology clinic on 08/26/2016.  At that time, she feelt "good".  Menses was light on Depo Provera.  Exam wasunremarkable.  Hematocrit had improved from 34.2 to 38.7.  Ferritin was 24.    Past Medical History:  Diagnosis Date  . Anemia of chronic disease   . Asthma   . CKD (chronic kidney disease), stage III   . Hypertension   . Morbid obesity ([place])     Past Surgical History:  Procedure Laterality Date  . TONSILLECTOMY      Family History  Problem Relation Age of Onset  . Hypertension Mother   . Multiple sclerosis Mother   . Cancer Maternal Grandmother        Ovarian   . Cancer Maternal Grandfather        Prostate     Social History:  reports that she has never smoked. She has never used smokeless tobacco. She reports that she does not drink alcohol or use drugs.  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  Her hours have improved.  The patient is alone today.  Allergies: No Known Allergies  Current Medications: Current Outpatient Prescriptions  Medication Sig Dispense Refill  . aspirin EC 81 MG EC tablet Take 1 tablet (81 mg total) by mouth daily. (Patient taking differently: Take 81 mg by mouth as needed for pain. ) 30 tablet 2  . carvedilol (COREG) 25 MG tablet Take 1 tablet (25 mg total) by mouth 2 (two) times daily with a meal. 60 tablet 5  . enalapril (VASOTEC) 5 MG tablet Take 10 mg by mouth daily.     . furosemide (LASIX) 40 MG tablet Take 1 tablet (40 mg total) by mouth 2 (two) times daily. 60 tablet 0  . Iron-Vitamin C 65-125 MG TABS Take 1 capsule by mouth daily. 30 tablet 1  . isosorbide mononitrate (IMDUR) 30 MG 24 hr tablet Take 1 tablet (30 mg total) by mouth  2 (two) times daily. 60 tablet 5   No current facility-administered medications for this visit.     Review of Systems:  GENERAL:  Feels "good".  No fevers or sweats.  Weight up 4 pounds. PERFORMANCE STATUS (ECOG):  0 HEENT:  No visual changes, runny nose, sore throat, mouth sores or tenderness. Lungs: No shortness of breath or cough.  No hemoptysis. Cardiac:  No chest pain, palpitations, orthopnea, or PND. GI:  No vomiting, diarrhea, constipation, melena or hematochezia. GU:  No urgency, frequency, dysuria, or hematuria.  Depo Provera every 3 months.. Musculoskeletal:  No back pain.  No joint pain.  No muscle tenderness. Extremities:  No pain or swelling. Skin:  No rashes or skin changes. Neuro:  No headache, numbness or weakness, balance or coordination issues. Endocrine:  No diabetes, thyroid issues, hot flashes or night sweats. Psych:  No mood changes, depression or anxiety. Pain:  No focal pain. Review of systems:  All other systems reviewed and found to be negative.  Physical Exam: There were no vitals taken for this visit. GENERAL:  Well developed, well nourished, heavyset woman sitting comfortably in the exam room in no acute distress. MENTAL STATUS:  Alert and oriented to person, place  and time. HEAD:  Brown hair pulled back.  Normocephalic, atraumatic, face symmetric, no Cushingoid features. EYES:  Brown eyes.  Pupils equal round and reactive to light and accomodation.  No conjunctivitis or scleral icterus. ENT:  Oropharynx clear without lesion.  Tongue normal. Mucous membranes moist.  RESPIRATORY:  Clear to auscultation without rales, wheezes or rhonchi. CARDIOVASCULAR:  Regular rate and rhythm without murmur, rub or gallop. ABDOMEN:  Soft, non-tender, with active bowel sounds, and no hepatosplenomegaly.  No masses. SKIN:  No rashes, ulcers or lesions. EXTREMITIES: No edema, no skin discoloration or tenderness.  No palpable cords. LYMPH NODES: No palpable cervical,  supraclavicular, axillary or inguinal adenopathy  NEUROLOGICAL: Unremarkable. PSYCH:  Appropriate.   No visits with results within 3 Day(s) from this visit.  Latest known visit with results is:  Appointment on 08/26/2016  Component Date Value Ref Range Status  . WBC 08/26/2016 12.2* 3.6 - 11.0 K/uL Final  . RBC 08/26/2016 4.77  3.80 - 5.20 MIL/uL Final  . Hemoglobin 08/26/2016 12.7  12.0 - 16.0 g/dL Final  . HCT 08/26/2016 38.7  35.0 - 47.0 % Final  . MCV 08/26/2016 81.0  80.0 - 100.0 fL Final  . MCH 08/26/2016 26.5  26.0 - 34.0 pg Final  . MCHC 08/26/2016 32.7  32.0 - 36.0 g/dL Final  . RDW 08/26/2016 16.8* 11.5 - 14.5 % Final  . Platelets 08/26/2016 169  150 - 440 K/uL Final  . Neutrophils Relative % 08/26/2016 69  % Final  . Neutro Abs 08/26/2016 8.4* 1.4 - 6.5 K/uL Final  . Lymphocytes Relative 08/26/2016 21  % Final  . Lymphs Abs 08/26/2016 2.5  1.0 - 3.6 K/uL Final  . Monocytes Relative 08/26/2016 7  % Final  . Monocytes Absolute 08/26/2016 0.8  0.2 - 0.9 K/uL Final  . Eosinophils Relative 08/26/2016 3  % Final  . Eosinophils Absolute 08/26/2016 0.4  0 - 0.7 K/uL Final  . Basophils Relative 08/26/2016 0  % Final  . Basophils Absolute 08/26/2016 0.0  0 - 0.1 K/uL Final  . Ferritin 08/26/2016 24  11 - 307 ng/mL Final  . Iron 08/26/2016 51  28 - 170 ug/dL Final  . TIBC 08/26/2016 359  250 - 450 ug/dL Final  . Saturation Ratios 08/26/2016 14  10.4 - 31.8 % Final  . UIBC 08/26/2016 308  ug/dL Final    Assessment:  [name] is a 31 y.o. female with renal insufficiency and iron deficiency anemia. Iron deficiency anemia was likely due to heavy menses. Diet is good. She denies melena or hematochezia.  Menses are now light on Depo Provera.  Work-up on 02/11/2016 revealed iron deficiency anemia with a ferritin of 9 (low), iron saturation 3% (low) and TIBC 506 (elevated). MCV was 59. BUN was 39 with a creatinine of 3.08. Calcium was 8.3. Albumin was 3.1 and protein was  6.8. Urinalysis reveals greater than 500 mg/dL protein. Normal labs included: B12, folate, TSH, hepatitis B and C serologies, and HIV testing. Guaiac was negative.  She received Feraheme 510 mg IV on 02/12/2016.  She does not take oral iron secondary to nausea.  She has anemia of chronic kidney disease.Creatinine was 3.14 (CrCl 22 ml/min) on 02/18/2016.  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   Symptomatically, she feels "good".  Menses are light on Depo Provera.  Exam is unremarkable.  Hematocrit has improved from 34.2 to 38.7.  Ferritin is 24.  Plan: 1.  Labs today:  CBC with diff, ferritin, iron studies. 2.  Discuss diet issues and iron rich foods.  Postpone IV iron as MCV normal with continued improvement in hematocrit and no blood loss (menses). 3.  No Feraheme today. 4.  RTC in 3 months for labs (CBC with diff, ferritin, creatinine). 5.  RTC in 6 months for MD assessment, labs (CBC with diff, ferritin) and +/- Feraheme.   Lequita Asal, MD  03/17/2017, 5:31 AM

## 2017-04-11 ENCOUNTER — Telehealth: Payer: Self-pay | Admitting: Family

## 2017-04-11 ENCOUNTER — Ambulatory Visit: Payer: 59 | Admitting: Family

## 2017-04-11 NOTE — Telephone Encounter (Signed)
Patient did not show for her [facility] appointment on 04/11/17. Will attempt to reschedule.

## 2017-05-01 ENCOUNTER — Inpatient Hospital Stay: Payer: 59 | Admitting: Hematology and Oncology

## 2017-05-01 ENCOUNTER — Inpatient Hospital Stay: Payer: 59

## 2017-05-01 NOTE — Progress Notes (Unsigned)
[facility] day:  05/01/17   Chief Complaint: [name] is a 31 y.o. female with chronic renal insufficiency and iron deficiency anemia who is seen for 8 month assessment.  HPI: The patient was last seen in the medical oncology clinic on 08/26/2016.  At that time, she felt "good".  Menses was light on Depo Provera.  Exam was unremarkable.  Hematocrit had improved from 34.2 to 38.7.  Ferritin was 24.  She did not receive IV iron.  She continued iron rich foods.  She has not had follow-up labs.  During the interim,   Past Medical History:  Diagnosis Date  . Anemia of chronic disease   . Asthma   . CKD (chronic kidney disease), stage III   . Hypertension   . Morbid obesity ([place])     Past Surgical History:  Procedure Laterality Date  . TONSILLECTOMY      Family History  Problem Relation Age of Onset  . Hypertension Mother   . Multiple sclerosis Mother   . Cancer Maternal Grandmother        Ovarian   . Cancer Maternal Grandfather        Prostate     Social History:  reports that she has never smoked. She has never used smokeless tobacco. She reports that she does not drink alcohol or use drugs.  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  Her hours have improved.  The patient is alone today.  Allergies: No Known Allergies  Current Medications: Current Outpatient Prescriptions  Medication Sig Dispense Refill  . aspirin EC 81 MG EC tablet Take 1 tablet (81 mg total) by mouth daily. (Patient taking differently: Take 81 mg by mouth as needed for pain. ) 30 tablet 2  . carvedilol (COREG) 25 MG tablet Take 1 tablet (25 mg total) by mouth 2 (two) times daily with a meal. 60 tablet 5  . enalapril (VASOTEC) 5 MG tablet Take 10 mg by mouth daily.     . furosemide (LASIX) 40 MG tablet Take 1 tablet (40 mg total) by mouth 2 (two) times daily. 60 tablet 0  . Iron-Vitamin C 65-125 MG TABS Take 1 capsule  by mouth daily. 30 tablet 1  . isosorbide mononitrate (IMDUR) 30 MG 24 hr tablet Take 1 tablet (30 mg total) by mouth 2 (two) times daily. 60 tablet 5   No current facility-administered medications for this visit.     Review of Systems:  GENERAL:  Feels "good".  No fevers or sweats.  Weight up 4 pounds. PERFORMANCE STATUS (ECOG):  0 HEENT:  No visual changes, runny nose, sore throat, mouth sores or tenderness. Lungs: No shortness of breath or cough.  No hemoptysis. Cardiac:  No chest pain, palpitations, orthopnea, or PND. GI:  No vomiting, diarrhea, constipation, melena or hematochezia. GU:  No urgency, frequency, dysuria, or hematuria.  Depo Provera every 3 months.. Musculoskeletal:  No back pain.  No joint pain.  No muscle tenderness. Extremities:  No pain or swelling. Skin:  No rashes or skin changes. Neuro:  No headache, numbness or weakness, balance or coordination issues. Endocrine:  No diabetes, thyroid issues, hot flashes or night sweats. Psych:  No mood changes, depression or anxiety. Pain:  No focal pain. Review of systems:  All other systems reviewed and found to be negative.  Physical Exam: There were no vitals taken for this visit. GENERAL:  Well  developed, well nourished, heavyset woman sitting comfortably in the exam room in no acute distress. MENTAL STATUS:  Alert and oriented to person, place and time. HEAD:  Brown hair pulled back.  Normocephalic, atraumatic, face symmetric, no Cushingoid features. EYES:  Brown eyes.  Pupils equal round and reactive to light and accomodation.  No conjunctivitis or scleral icterus. ENT:  Oropharynx clear without lesion.  Tongue normal. Mucous membranes moist.  RESPIRATORY:  Clear to auscultation without rales, wheezes or rhonchi. CARDIOVASCULAR:  Regular rate and rhythm without murmur, rub or gallop. ABDOMEN:  Soft, non-tender, with active bowel sounds, and no hepatosplenomegaly.  No masses. SKIN:  No rashes, ulcers or  lesions. EXTREMITIES: No edema, no skin discoloration or tenderness.  No palpable cords. LYMPH NODES: No palpable cervical, supraclavicular, axillary or inguinal adenopathy  NEUROLOGICAL: Unremarkable. PSYCH:  Appropriate.   No visits with results within 3 Day(s) from this visit.  Latest known visit with results is:  Appointment on 08/26/2016  Component Date Value Ref Range Status  . WBC 08/26/2016 12.2* 3.6 - 11.0 K/uL Final  . RBC 08/26/2016 4.77  3.80 - 5.20 MIL/uL Final  . Hemoglobin 08/26/2016 12.7  12.0 - 16.0 g/dL Final  . HCT 08/26/2016 38.7  35.0 - 47.0 % Final  . MCV 08/26/2016 81.0  80.0 - 100.0 fL Final  . MCH 08/26/2016 26.5  26.0 - 34.0 pg Final  . MCHC 08/26/2016 32.7  32.0 - 36.0 g/dL Final  . RDW 08/26/2016 16.8* 11.5 - 14.5 % Final  . Platelets 08/26/2016 169  150 - 440 K/uL Final  . Neutrophils Relative % 08/26/2016 69  % Final  . Neutro Abs 08/26/2016 8.4* 1.4 - 6.5 K/uL Final  . Lymphocytes Relative 08/26/2016 21  % Final  . Lymphs Abs 08/26/2016 2.5  1.0 - 3.6 K/uL Final  . Monocytes Relative 08/26/2016 7  % Final  . Monocytes Absolute 08/26/2016 0.8  0.2 - 0.9 K/uL Final  . Eosinophils Relative 08/26/2016 3  % Final  . Eosinophils Absolute 08/26/2016 0.4  0 - 0.7 K/uL Final  . Basophils Relative 08/26/2016 0  % Final  . Basophils Absolute 08/26/2016 0.0  0 - 0.1 K/uL Final  . Ferritin 08/26/2016 24  11 - 307 ng/mL Final  . Iron 08/26/2016 51  28 - 170 ug/dL Final  . TIBC 08/26/2016 359  250 - 450 ug/dL Final  . Saturation Ratios 08/26/2016 14  10.4 - 31.8 % Final  . UIBC 08/26/2016 308  ug/dL Final    Assessment:  [name] is a 31 y.o. female with renal insufficiency and iron deficiency anemia. Iron deficiency anemia was likely due to heavy menses. Diet is good. She denies melena or hematochezia.  Menses are now light on Depo Provera.  Work-up on 02/11/2016 revealed iron deficiency anemia with a ferritin of 9 (low), iron saturation 3% (low)  and TIBC 506 (elevated). MCV was 59. BUN was 39 with a creatinine of 3.08. Calcium was 8.3. Albumin was 3.1 and protein was 6.8. Urinalysis reveals greater than 500 mg/dL protein. Normal labs included: B12, folate, TSH, hepatitis B and C serologies, and HIV testing. Guaiac was negative.  She received Feraheme 510 mg IV on 02/12/2016.  She does not take oral iron secondary to nausea.  She has anemia of chronic kidney disease.Creatinine was 3.14 (CrCl 22 ml/min) on 02/18/2016.  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   Symptomatically, she feels "good".  Menses are  light on Depo Provera.  Exam is unremarkable.  Hematocrit has improved from 34.2 to 38.7.  Ferritin is 24.  Plan: 1.  Labs today:  CBC with diff, ferritin, iron studies, creatinine.  2.  Discuss diet issues and iron rich foods.  Postpone IV iron as MCV normal with continued improvement in hematocrit and no blood loss (menses). 3.  No Feraheme today. 4.  RTC in 3 months for labs (CBC with diff, ferritin, creatinine). 5.  RTC in 6 months for MD assessment, labs (CBC with diff, ferritin) and +/- Feraheme.   Lequita Asal, MD  05/01/2017, 4:28 AM

## 2017-05-25 ENCOUNTER — Inpatient Hospital Stay: Payer: 59 | Attending: Hematology and Oncology

## 2017-05-25 ENCOUNTER — Encounter: Payer: Self-pay | Admitting: Hematology and Oncology

## 2017-05-25 ENCOUNTER — Inpatient Hospital Stay (HOSPITAL_BASED_OUTPATIENT_CLINIC_OR_DEPARTMENT_OTHER): Payer: 59 | Admitting: Hematology and Oncology

## 2017-05-25 VITALS — BP 174/120 | HR 78 | Temp 97.6°F | Resp 18 | Wt 333.2 lb

## 2017-05-25 DIAGNOSIS — I129 Hypertensive chronic kidney disease with stage 1 through stage 4 chronic kidney disease, or unspecified chronic kidney disease: Secondary | ICD-10-CM | POA: Diagnosis not present

## 2017-05-25 DIAGNOSIS — Z79899 Other long term (current) drug therapy: Secondary | ICD-10-CM

## 2017-05-25 DIAGNOSIS — D631 Anemia in chronic kidney disease: Secondary | ICD-10-CM

## 2017-05-25 DIAGNOSIS — N269 Renal sclerosis, unspecified: Secondary | ICD-10-CM | POA: Diagnosis not present

## 2017-05-25 DIAGNOSIS — J45909 Unspecified asthma, uncomplicated: Secondary | ICD-10-CM | POA: Insufficient documentation

## 2017-05-25 DIAGNOSIS — Z7982 Long term (current) use of aspirin: Secondary | ICD-10-CM | POA: Diagnosis not present

## 2017-05-25 DIAGNOSIS — N184 Chronic kidney disease, stage 4 (severe): Secondary | ICD-10-CM

## 2017-05-25 DIAGNOSIS — N183 Chronic kidney disease, stage 3 (moderate): Secondary | ICD-10-CM | POA: Diagnosis not present

## 2017-05-25 DIAGNOSIS — D509 Iron deficiency anemia, unspecified: Secondary | ICD-10-CM

## 2017-05-25 DIAGNOSIS — N289 Disorder of kidney and ureter, unspecified: Secondary | ICD-10-CM

## 2017-05-25 LAB — CBC WITH DIFFERENTIAL/PLATELET
Basophils Absolute: 0 10*3/uL (ref 0–0.1)
Basophils Relative: 0 %
Eosinophils Absolute: 0.2 10*3/uL (ref 0–0.7)
Eosinophils Relative: 2 %
HCT: 38.5 % (ref 35.0–47.0)
Hemoglobin: 12.3 g/dL (ref 12.0–16.0)
Lymphocytes Relative: 22 %
Lymphs Abs: 2.6 10*3/uL (ref 1.0–3.6)
MCH: 25 pg — ABNORMAL LOW (ref 26.0–34.0)
MCHC: 31.9 g/dL — ABNORMAL LOW (ref 32.0–36.0)
MCV: 78.5 fL — ABNORMAL LOW (ref 80.0–100.0)
Monocytes Absolute: 0.8 10*3/uL (ref 0.2–0.9)
Monocytes Relative: 7 %
Neutro Abs: 8.1 10*3/uL — ABNORMAL HIGH (ref 1.4–6.5)
Neutrophils Relative %: 69 %
Platelets: 233 10*3/uL (ref 150–440)
RBC: 4.91 MIL/uL (ref 3.80–5.20)
RDW: 17.3 % — ABNORMAL HIGH (ref 11.5–14.5)
WBC: 11.8 10*3/uL — ABNORMAL HIGH (ref 3.6–11.0)

## 2017-05-25 LAB — IRON AND TIBC
Iron: 33 ug/dL (ref 28–170)
Saturation Ratios: 10 % — ABNORMAL LOW (ref 10.4–31.8)
TIBC: 323 ug/dL (ref 250–450)
UIBC: 290 ug/dL

## 2017-05-25 LAB — CREATININE, SERUM
Creatinine, Ser: 2.56 mg/dL — ABNORMAL HIGH (ref 0.44–1.00)
GFR calc Af Amer: 27 mL/min — ABNORMAL LOW (ref >60)
GFR calc non Af Amer: 24 mL/min — ABNORMAL LOW (ref >60)

## 2017-05-25 LAB — FERRITIN: Ferritin: 20 ng/mL (ref 11–307)

## 2017-05-25 NOTE — Progress Notes (Signed)
Discussed elevated blood pressure with patient and discussed with Dr. Mike Gip.  Patient reports not taking her medicine this am, Dr. Mike Gip and RN instructed patient that the level of her BP was dangerous for a CVA and encouraged patient to go to the ED.

## 2017-05-25 NOTE — Progress Notes (Signed)
Patient offers no complaints today.  BP elevated 198/107 HR 86.  Patient states she has not taken her BP medications this morning.  She is going home after her appointment and will take it when she gets home.

## 2017-05-25 NOTE — Progress Notes (Signed)
[facility] day:  05/25/17   Chief Complaint: [name] is a 31 y.o. female with chronic renal insufficiency and iron deficiency anemia who is seen for 8 month assessment.  HPI: The patient was last seen in the medical oncology clinic on 08/26/2016.  At that time, she felt "good".  Menses was light on Depo Provera.  Exam was unremarkable.  Hematocrit had improved from 34.2 to 38.7.  Ferritin was 24.  She did not receive IV iron.  She continued iron rich foods.  She has not had follow-up labs.  During the interim, she denies any complaints.  She missed her follow-up as she was helping out in [place] on other properties.  She states that her menses has been light. She saw Dr. Candiss Norse about 2 months ago. She was told her renal function was stable. She states that her blood pressure is high today as she did not take her blood pressure medicine this morning.   Past Medical History:  Diagnosis Date  . Anemia of chronic disease   . Asthma   . CKD (chronic kidney disease), stage III   . Hypertension   . Morbid obesity ([place])     Past Surgical History:  Procedure Laterality Date  . TONSILLECTOMY      Family History  Problem Relation Age of Onset  . Hypertension Mother   . Multiple sclerosis Mother   . Cancer Maternal Grandmother        Ovarian   . Cancer Maternal Grandfather        Prostate     Social History:  reports that she has never smoked. She has never used smokeless tobacco. She reports that she does not drink alcohol or use drugs.  She is a Freight forwarder at [employer].  She describes working 126 hours in 2 weeks.  Her hours have improved.  She has recently been working in [place].  The patient is alone today.  Allergies: No Known Allergies  Current Medications: Current Outpatient Prescriptions  Medication Sig Dispense Refill  . aspirin EC 81 MG EC tablet Take 1 tablet (81 mg total) by mouth daily. (Patient taking differently:  Take 81 mg by mouth as needed for pain. ) 30 tablet 2  . carvedilol (COREG) 25 MG tablet Take 1 tablet (25 mg total) by mouth 2 (two) times daily with a meal. 60 tablet 5  . enalapril (VASOTEC) 5 MG tablet Take 10 mg by mouth daily.     . furosemide (LASIX) 40 MG tablet Take 1 tablet (40 mg total) by mouth 2 (two) times daily. 60 tablet 0  . Iron-Vitamin C 65-125 MG TABS Take 1 capsule by mouth daily. 30 tablet 1  . isosorbide mononitrate (IMDUR) 30 MG 24 hr tablet Take 1 tablet (30 mg total) by mouth 2 (two) times daily. 60 tablet 5   No current facility-administered medications for this visit.     Review of Systems:  GENERAL:  Feels "good".  No fevers or sweats.  Weight up 23 pounds. PERFORMANCE STATUS (ECOG):  0 HEENT:  No visual changes, runny nose, sore throat, mouth sores or tenderness. Lungs: No shortness of breath or cough.  No hemoptysis. Cardiac:  No chest pain, palpitations, orthopnea, or PND.  Hypertension, did not take medications this morning. GI:  No vomiting, diarrhea, constipation, melena or hematochezia. GU:  No urgency, frequency, dysuria, or hematuria.  Depo Provera every 3 months.. Musculoskeletal:  No back pain.  No joint  pain.  No muscle tenderness. Extremities:  No pain or swelling. Skin:  No rashes or skin changes. Neuro:  No headache, numbness or weakness, balance or coordination issues. Endocrine:  No diabetes, thyroid issues, hot flashes or night sweats. Psych:  No mood changes, depression or anxiety. Pain:  No focal pain. Review of systems:  All other systems reviewed and found to be negative.  Physical Exam: Blood pressure (!) 198/107, pulse 86, temperature 97.6 F (36.4 C), temperature source Tympanic, resp. rate 18, weight (!) 333 lb 4 oz (151.2 kg). GENERAL:  Well developed, well nourished, heavyset woman sitting comfortably in the exam room in no acute distress. MENTAL STATUS:  Alert and oriented to person, place and time. HEAD:  Brown hair pulled  back.  Normocephalic, atraumatic, face symmetric, no Cushingoid features. EYES:  Brown eyes.  Right stye.  Pupils equal round and reactive to light and accomodation.  No conjunctivitis or scleral icterus. ENT:  Oropharynx clear without lesion.  Tongue normal. Mucous membranes moist.  RESPIRATORY:  Clear to auscultation without rales, wheezes or rhonchi. CARDIOVASCULAR:  Regular rate and rhythm without murmur, rub or gallop. ABDOMEN:  Soft, non-tender, with active bowel sounds, and no hepatosplenomegaly.  No masses. SKIN:  No rashes, ulcers or lesions. EXTREMITIES: No edema, no skin discoloration or tenderness.  No palpable cords. LYMPH NODES: No palpable cervical, supraclavicular, axillary or inguinal adenopathy  NEUROLOGICAL: Unremarkable. PSYCH:  Appropriate.   Appointment on 05/25/2017  Component Date Value Ref Range Status  . WBC 05/25/2017 11.8* 3.6 - 11.0 K/uL Final  . RBC 05/25/2017 4.91  3.80 - 5.20 MIL/uL Final  . Hemoglobin 05/25/2017 12.3  12.0 - 16.0 g/dL Final  . HCT 05/25/2017 38.5  35.0 - 47.0 % Final  . MCV 05/25/2017 78.5* 80.0 - 100.0 fL Final  . MCH 05/25/2017 25.0* 26.0 - 34.0 pg Final  . MCHC 05/25/2017 31.9* 32.0 - 36.0 g/dL Final  . RDW 05/25/2017 17.3* 11.5 - 14.5 % Final  . Platelets 05/25/2017 233  150 - 440 K/uL Final  . Neutrophils Relative % 05/25/2017 69  % Final  . Neutro Abs 05/25/2017 8.1* 1.4 - 6.5 K/uL Final  . Lymphocytes Relative 05/25/2017 22  % Final  . Lymphs Abs 05/25/2017 2.6  1.0 - 3.6 K/uL Final  . Monocytes Relative 05/25/2017 7  % Final  . Monocytes Absolute 05/25/2017 0.8  0.2 - 0.9 K/uL Final  . Eosinophils Relative 05/25/2017 2  % Final  . Eosinophils Absolute 05/25/2017 0.2  0 - 0.7 K/uL Final  . Basophils Relative 05/25/2017 0  % Final  . Basophils Absolute 05/25/2017 0.0  0 - 0.1 K/uL Final  . Ferritin 05/25/2017 20  11 - 307 ng/mL Final  . Creatinine, Ser 05/25/2017 2.56* 0.44 - 1.00 mg/dL Final  . GFR calc non Af Amer  05/25/2017 24* >60 mL/min Final  . GFR calc Af Amer 05/25/2017 27* >60 mL/min Final   Comment: (NOTE) The eGFR has been calculated using the CKD EPI equation. This calculation has not been validated in all clinical situations. eGFR's persistently <60 mL/min signify possible Chronic Kidney Disease.   . Iron 05/25/2017 33  28 - 170 ug/dL Final  . TIBC 05/25/2017 323  250 - 450 ug/dL Final  . Saturation Ratios 05/25/2017 10* 10.4 - 31.8 % Final  . UIBC 05/25/2017 290  ug/dL Final    Assessment:  [name] is a 31 y.o. female with renal insufficiency and iron deficiency anemia. Iron deficiency anemia was  likely due to heavy menses. Diet is good. She denies melena or hematochezia.  Menses are now light on Depo Provera.  Work-up on 02/11/2016 revealed iron deficiency anemia with a ferritin of 9 (low), iron saturation 3% (low) and TIBC 506 (elevated). MCV was 59. BUN was 39 with a creatinine of 3.08. Calcium was 8.3. Albumin was 3.1 and protein was 6.8. Urinalysis reveals greater than 500 mg/dL protein. Normal labs included: B12, folate, TSH, hepatitis B and C serologies, and HIV testing. Guaiac was negative.  She received Feraheme 510 mg IV on 02/12/2016.  She does not take oral iron secondary to nausea.  She has anemia of chronic kidney disease.Creatinine was 3.14 (CrCl 22 ml/min) on 02/18/2016.  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   Symptomatically, she feels denies any complaint.  Menses are light on Depo Provera.  Blood pressure is elevated.  Exam is unremarkable.  Hematocrit is 38.5.  Ferritin is 20.  Creatinine is 2.56.  Plan: 1.  Labs today:  CBC with diff, ferritin, iron studies, creatinine. 2.  Discuss stable hematocrit.  No anemia.  MCV slightly low.  May require additional IV iron in next 6 weeks.  3.  Discuss concern for increased creatinine.  Follow-up with Dr. Candiss Norse. 4.   Discuss concern for elevated blood pressure.  Blood pressure repeated today.  Encouraged patient to go to ER. 5.  RTC in 6 weeks for labs (CBC with diff, ferritin) 6.  RTC in 3 months for MD assess, labs (CBC with diff, BMP, ferritin-day before) +/- Feraheme.   Lequita Asal, MD  05/25/2017, 11:00 AM

## 2017-07-04 ENCOUNTER — Emergency Department
Admission: EM | Admit: 2017-07-04 | Discharge: 2017-07-04 | Disposition: A | Discharge status: Home / Self Care | Payer: 59 | Attending: Emergency Medicine | Admitting: Emergency Medicine

## 2017-07-04 ENCOUNTER — Emergency Department: Payer: 59

## 2017-07-04 DIAGNOSIS — I130 Hypertensive heart and chronic kidney disease with heart failure and stage 1 through stage 4 chronic kidney disease, or unspecified chronic kidney disease: Secondary | ICD-10-CM | POA: Diagnosis not present

## 2017-07-04 DIAGNOSIS — J45909 Unspecified asthma, uncomplicated: Secondary | ICD-10-CM | POA: Insufficient documentation

## 2017-07-04 DIAGNOSIS — Z7982 Long term (current) use of aspirin: Secondary | ICD-10-CM | POA: Insufficient documentation

## 2017-07-04 DIAGNOSIS — R0789 Other chest pain: Secondary | ICD-10-CM | POA: Insufficient documentation

## 2017-07-04 DIAGNOSIS — N183 Chronic kidney disease, stage 3 (moderate): Secondary | ICD-10-CM | POA: Insufficient documentation

## 2017-07-04 DIAGNOSIS — I509 Heart failure, unspecified: Secondary | ICD-10-CM | POA: Diagnosis not present

## 2017-07-04 DIAGNOSIS — Z79899 Other long term (current) drug therapy: Secondary | ICD-10-CM | POA: Insufficient documentation
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
## 2017-07-04 MED ORDER — CEPHALEXIN 500 MG PO CAPS: 500.0000 mg | ORAL_CAPSULE | Freq: Three times a day (TID) | ORAL | 0 refills | Status: AC

## 2017-07-04 MED ORDER — KETOROLAC TROMETHAMINE 30 MG/ML IJ SOLN
30.0000 mg | Freq: Once | INTRAMUSCULAR | Status: AC
  Administered 2017-07-04: 30 mg via INTRAMUSCULAR
  Filled 2017-07-04: qty 1

## 2017-07-04 MED ORDER — ORPHENADRINE CITRATE 30 MG/ML IJ SOLN
60.0000 mg | Freq: Two times a day (BID) | INTRAMUSCULAR | Status: DC
  Administered 2017-07-04: 60 mg via INTRAMUSCULAR
  Filled 2017-07-04: qty 2

## 2017-07-04 MED ORDER — MELOXICAM 7.5 MG PO TABS: 7.5000 mg | ORAL_TABLET | Freq: Every day | ORAL | 1 refills | Status: AC

## 2017-07-04 MED ORDER — CYCLOBENZAPRINE HCL 10 MG PO TABS: 10.0000 mg | ORAL_TABLET | Freq: Three times a day (TID) | ORAL | 0 refills | Status: AC | PRN
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
## 2017-07-04 NOTE — ED Notes (Signed)
Pt states that her mother is going to take her home. Pt was advised not to drive since she received medication that will make her drowsy.
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
## 2017-07-06 ENCOUNTER — Inpatient Hospital Stay: Payer: 59 | Attending: Hematology and Oncology

## 2017-08-23 ENCOUNTER — Inpatient Hospital Stay: Payer: 59 | Attending: Hematology and Oncology

## 2017-08-24 ENCOUNTER — Inpatient Hospital Stay: Payer: 59 | Admitting: Hematology and Oncology

## 2017-08-24 ENCOUNTER — Inpatient Hospital Stay: Payer: 59

## 2017-08-24 ENCOUNTER — Other Ambulatory Visit: Payer: Self-pay | Admitting: Hematology and Oncology

## 2017-08-24 DIAGNOSIS — D509 Iron deficiency anemia, unspecified: Secondary | ICD-10-CM
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
## 2017-08-31 DIAGNOSIS — R7301 Impaired fasting glucose: Secondary | ICD-10-CM | POA: Insufficient documentation

## 2017-08-31 DIAGNOSIS — N183 Chronic kidney disease, stage 3 unspecified: Secondary | ICD-10-CM | POA: Insufficient documentation

## 2018-01-23 ENCOUNTER — Other Ambulatory Visit: Payer: Self-pay | Admitting: Family

## 2018-01-25 ENCOUNTER — Other Ambulatory Visit: Payer: Self-pay | Admitting: Family

## 2018-02-07 ENCOUNTER — Other Ambulatory Visit: Payer: Self-pay | Admitting: Family

## 2018-02-18 NOTE — Progress Notes (Signed)
Patient ID: [name], female    DOB: [birth year], 32 y.o.   MRN: [id number]  HPI  [name] is a 32 y/o female with a history of HTN, CKD, morbid obesity, asthma, anemia and chronic heart failure.   Last echo was done 02/12/16 and showed an EF of 50-55%.  Has not been admitted or been in the ED in the last 6 months.   She presents today for a follow-up visit although hasn't been seen since March 2018. She presents with intermittent palpitations since she's been out of medication for the last month. She has associated minimal edema in left foot. She currently denies any difficulty sleeping, abdominal distention, chest pain, shortness of breath, fatigue, dizziness or weight gain. Currently without medical insurance.   Past Medical History:  Diagnosis Date  . Anemia of chronic disease   . Asthma   . CKD (chronic kidney disease), stage III   . Hypertension   . Morbid obesity ([place])    Past Surgical History:  Procedure Laterality Date  . TONSILLECTOMY     Family History  Problem Relation Age of Onset  . Hypertension Mother   . Multiple sclerosis Mother   . Cancer Maternal Grandmother        Ovarian   . Cancer Maternal Grandfather        Prostate    Social History   Tobacco Use  . Smoking status: Never Smoker  . Smokeless tobacco: Never Used  Substance Use Topics  . Alcohol use: No    Alcohol/week: 0.0 oz   No Known Allergies  Prior to Admission medications   Medication Sig Start Date End Date Taking? Authorizing Provider  aspirin EC 81 MG EC tablet Take 1 tablet (81 mg total) by mouth daily. Patient taking differently: Take 81 mg by mouth as needed for pain.  02/16/16  Yes Demetrios Loll, MD  enalapril (VASOTEC) 5 MG tablet Take 10 mg by mouth daily.    Yes [provider]  furosemide (LASIX) 40 MG tablet Take 1 tablet (40 mg total) by mouth 2 (two) times daily. 02/16/16  Yes Demetrios Loll, MD  Insulin Degludec (TRESIBA) 100 UNIT/ML SOLN Inject 24 Units into the skin  daily.   Yes [provider]  Iron-Vitamin C 65-125 MG TABS Take 1 capsule by mouth daily. 06/03/16  Yes Lequita Asal, MD    Review of Systems  Constitutional: Negative for appetite change and fatigue.  HENT: Negative for congestion, postnasal drip and sore throat.   Eyes: Negative.   Respiratory: Negative for chest tightness and shortness of breath.   Cardiovascular: Positive for palpitations (infrequently) and leg swelling (at times). Negative for chest pain.  Gastrointestinal: Negative for abdominal distention and abdominal pain.  Endocrine: Negative.   Genitourinary: Negative.   Musculoskeletal: Negative for back pain and neck pain.  Skin: Negative.   Allergic/Immunologic: Negative.   Neurological: Negative for dizziness and light-headedness.  Hematological: Negative for adenopathy. Does not bruise/bleed easily.  Psychiatric/Behavioral: Negative for dysphoric mood, sleep disturbance (sleeping on 2 pillows) and suicidal ideas. The patient is not nervous/anxious.    Vitals:   02/20/18 1331  BP: (!) 166/97  Pulse: 89  Resp: 18  SpO2: 100%  Weight: (!) 330 lb 4 oz (149.8 kg)  Height: 5\' 6"  (1.676 m)   Wt Readings from Last 3 Encounters:  02/20/18 (!) 330 lb 4 oz (149.8 kg)  07/04/17 (!) 330 lb (149.7 kg)  05/25/17 (!) 333 lb 4 oz (151.2 kg)  Lab Results  Component Value Date   CREATININE 2.56 (H) 05/25/2017   CREATININE 2.26 (H) 07/28/2016   CREATININE 3.14 (H) 02/18/2016    Physical Exam  Constitutional: She is oriented to person, place, and time. She appears well-developed and well-nourished.  HENT:  Head: Normocephalic and atraumatic.  Eyes: Pupils are equal, round, and reactive to light. Conjunctivae are normal.  Neck: Normal range of motion. Neck supple. No JVD present.  Cardiovascular: Normal rate and regular rhythm.  Pulmonary/Chest: Effort normal. She has no wheezes. She has no rales.  Abdominal: Soft. She exhibits no distension. There is no  tenderness.  Musculoskeletal: She exhibits no edema or tenderness.  Neurological: She is alert and oriented to person, place, and time.  Skin: Skin is warm and dry.  Psychiatric: She has a normal mood and affect. Her behavior is normal. Thought content normal.  Nursing note and vitals reviewed.   Assessment & Plan:  1: Chronic heart failure with preserved ejection fraction-  - NYHA class I - euvolemic - has been weighing daily. Reminded to call for an overnight weight gain of >2 pounds or a weekly weight gain of >5 pounds - weight down 3 pounds from August 2018 - currently without medical insurance so unable to follow-up with cardiology - charity care application given to patient to fill out  2: HTN-  - BP elevated today but she's been out of carvedilol and isosorbide for the last month - refilled isosorbide as well as carvedilol. Did advise her to start the carvedilol at 1/2 tablet twice daily for a week and then begin 1 tablet twice daily -has seen PCP at [facility] within the last 2 months - supposed to have a sleep study but will need to wait to see if she gets approved for charity care - information given regarding medication management clinic  Patient did not bring her medications nor a list. Each medication was verbally reviewed with the patient and she was encouraged to bring the bottles to every visit to confirm accuracy of list.  Return in 1 month or sooner for any questions/problems before then.

## 2018-02-20 ENCOUNTER — Encounter: Payer: Self-pay | Admitting: Family

## 2018-02-20 ENCOUNTER — Ambulatory Visit: Payer: Self-pay | Attending: Family | Admitting: Family

## 2018-02-20 VITALS — BP 166/97 | HR 89 | Resp 18 | Ht 66.0 in | Wt 330.2 lb

## 2018-02-20 DIAGNOSIS — I5032 Chronic diastolic (congestive) heart failure: Secondary | ICD-10-CM | POA: Insufficient documentation

## 2018-02-20 DIAGNOSIS — Z7982 Long term (current) use of aspirin: Secondary | ICD-10-CM | POA: Insufficient documentation

## 2018-02-20 DIAGNOSIS — I10 Essential (primary) hypertension: Secondary | ICD-10-CM

## 2018-02-20 DIAGNOSIS — D631 Anemia in chronic kidney disease: Secondary | ICD-10-CM | POA: Insufficient documentation

## 2018-02-20 DIAGNOSIS — N183 Chronic kidney disease, stage 3 (moderate): Secondary | ICD-10-CM | POA: Insufficient documentation

## 2018-02-20 DIAGNOSIS — Z79899 Other long term (current) drug therapy: Secondary | ICD-10-CM | POA: Insufficient documentation

## 2018-02-20 DIAGNOSIS — I130 Hypertensive heart and chronic kidney disease with heart failure and stage 1 through stage 4 chronic kidney disease, or unspecified chronic kidney disease: Secondary | ICD-10-CM | POA: Insufficient documentation

## 2018-02-20 DIAGNOSIS — J45909 Unspecified asthma, uncomplicated: Secondary | ICD-10-CM | POA: Insufficient documentation

## 2018-02-20 DIAGNOSIS — Z6841 Body Mass Index (BMI) 40.0 and over, adult: Secondary | ICD-10-CM | POA: Insufficient documentation

## 2018-02-20 MED ORDER — ISOSORBIDE MONONITRATE ER 30 MG PO TB24: 30.0000 mg | ORAL_TABLET | Freq: Two times a day (BID) | ORAL | 5 refills | Status: DC

## 2018-02-20 MED ORDER — CARVEDILOL 25 MG PO TABS: 25.0000 mg | ORAL_TABLET | Freq: Two times a day (BID) | ORAL | 5 refills | Status: DC

## 2018-02-20 NOTE — Patient Instructions (Addendum)
Continue weighing daily and call for an overnight weight gain of > 2 pounds or a weekly weight gain of >5 pounds.  Resume carvedilol 1/2 tablet (12.5mg ) twice daily for one week and then go to 1 whole tablet twice daily.

## 2018-03-27 ENCOUNTER — Ambulatory Visit: Payer: Self-pay | Admitting: Family

## 2018-04-13 ENCOUNTER — Telehealth: Payer: Self-pay | Admitting: Family

## 2018-04-13 ENCOUNTER — Ambulatory Visit: Payer: Self-pay | Admitting: Family

## 2018-04-13 NOTE — Telephone Encounter (Signed)
Patient did not show for her [facility] appointment on 04/13/18. Will attempt to reschedule.

## 2018-10-17 HISTORY — DX: Other disorders of calcium metabolism: E83.59

## 2019-02-28 ENCOUNTER — Encounter
Admission: RE | Disposition: A | Discharge status: Home / Self Care | Payer: Self-pay | Source: Ambulatory Visit | Attending: Vascular Surgery

## 2019-02-28 ENCOUNTER — Other Ambulatory Visit
Admission: RE | Admit: 2019-02-28 | Discharge: 2019-02-28 | Disposition: A | Discharge status: Home / Self Care | Payer: Self-pay | Source: Ambulatory Visit | Attending: Vascular Surgery | Admitting: Vascular Surgery

## 2019-02-28 ENCOUNTER — Other Ambulatory Visit (INDEPENDENT_AMBULATORY_CARE_PROVIDER_SITE_OTHER): Payer: Self-pay | Admitting: Nurse Practitioner

## 2019-02-28 ENCOUNTER — Ambulatory Visit
Admission: RE | Admit: 2019-02-28 | Discharge: 2019-02-28 | Disposition: A | Discharge status: Home / Self Care | Payer: Self-pay | Source: Ambulatory Visit | Attending: Vascular Surgery | Admitting: Vascular Surgery

## 2019-02-28 ENCOUNTER — Other Ambulatory Visit: Payer: Self-pay

## 2019-02-28 DIAGNOSIS — Z1159 Encounter for screening for other viral diseases: Secondary | ICD-10-CM | POA: Insufficient documentation

## 2019-02-28 DIAGNOSIS — Z992 Dependence on renal dialysis: Secondary | ICD-10-CM | POA: Insufficient documentation

## 2019-02-28 DIAGNOSIS — I120 Hypertensive chronic kidney disease with stage 5 chronic kidney disease or end stage renal disease: Secondary | ICD-10-CM

## 2019-02-28 DIAGNOSIS — N186 End stage renal disease: Secondary | ICD-10-CM | POA: Insufficient documentation

## 2019-02-28 DIAGNOSIS — E1122 Type 2 diabetes mellitus with diabetic chronic kidney disease: Secondary | ICD-10-CM | POA: Insufficient documentation

## 2019-02-28 DIAGNOSIS — Z8249 Family history of ischemic heart disease and other diseases of the circulatory system: Secondary | ICD-10-CM | POA: Insufficient documentation

## 2019-02-28 DIAGNOSIS — I509 Heart failure, unspecified: Secondary | ICD-10-CM | POA: Insufficient documentation

## 2019-02-28 DIAGNOSIS — I132 Hypertensive heart and chronic kidney disease with heart failure and with stage 5 chronic kidney disease, or end stage renal disease: Secondary | ICD-10-CM | POA: Insufficient documentation

## 2019-02-28 DIAGNOSIS — Z6841 Body Mass Index (BMI) 40.0 and over, adult: Secondary | ICD-10-CM | POA: Insufficient documentation

## 2019-02-28 HISTORY — DX: Type 2 diabetes mellitus without complications: E11.9

## 2019-02-28 HISTORY — PX: DIALYSIS/PERMA CATHETER INSERTION: CATH118288

## 2019-02-28 LAB — POTASSIUM (ARMC VASCULAR LAB ONLY): Potassium (ARMC vascular lab): 4.3 (ref 3.5–5.1)

## 2019-02-28 LAB — GLUCOSE, CAPILLARY
Glucose-Capillary: 96 mg/dL (ref 70–99)
Glucose-Capillary: 95 mg/dL (ref 70–99)

## 2019-02-28 LAB — SARS CORONAVIRUS 2 BY RT PCR (HOSPITAL ORDER, PERFORMED IN ~~LOC~~ HOSPITAL LAB): SARS Coronavirus 2: NEGATIVE

## 2019-02-28 SURGERY — DIALYSIS/PERMA CATHETER INSERTION
Anesthesia: Moderate Sedation

## 2019-02-28 MED ORDER — ONDANSETRON HCL 4 MG/2ML IJ SOLN: 4.0000 mg | Freq: Four times a day (QID) | INTRAMUSCULAR | Status: DC | PRN

## 2019-02-28 MED ORDER — METHYLPREDNISOLONE SODIUM SUCC 125 MG IJ SOLR: 125.0000 mg | Freq: Once | INTRAMUSCULAR | Status: DC | PRN

## 2019-02-28 MED ORDER — DIPHENHYDRAMINE HCL 50 MG/ML IJ SOLN: 50.0000 mg | Freq: Once | INTRAMUSCULAR | Status: DC | PRN

## 2019-02-28 MED ORDER — HYDROMORPHONE HCL 1 MG/ML IJ SOLN: 1.0000 mg | Freq: Once | INTRAMUSCULAR | Status: DC | PRN

## 2019-02-28 MED ORDER — CEFAZOLIN SODIUM-DEXTROSE 1-4 GM/50ML-% IV SOLN
1.0000 g | Freq: Once | INTRAVENOUS | Status: AC
  Administered 2019-02-28: 1 g via INTRAVENOUS

## 2019-02-28 MED ORDER — FAMOTIDINE 20 MG PO TABS: 40.0000 mg | ORAL_TABLET | Freq: Once | ORAL | Status: DC | PRN

## 2019-02-28 MED ORDER — FENTANYL CITRATE (PF) 100 MCG/2ML IJ SOLN
INTRAMUSCULAR | Status: AC
  Filled 2019-02-28: qty 2

## 2019-02-28 MED ORDER — SODIUM CHLORIDE 0.9 % IV SOLN: INTRAVENOUS | Status: DC

## 2019-02-28 MED ORDER — HEPARIN SODIUM (PORCINE) 10000 UNIT/ML IJ SOLN
INTRAMUSCULAR | Status: AC
  Filled 2019-02-28: qty 1

## 2019-02-28 MED ORDER — MIDAZOLAM HCL 2 MG/2ML IJ SOLN
INTRAMUSCULAR | Status: DC | PRN
  Administered 2019-02-28 (×2): 2 mg via INTRAVENOUS

## 2019-02-28 MED ORDER — FENTANYL CITRATE (PF) 100 MCG/2ML IJ SOLN
INTRAMUSCULAR | Status: DC | PRN
  Administered 2019-02-28 (×2): 50 ug via INTRAVENOUS

## 2019-02-28 MED ORDER — MIDAZOLAM HCL 2 MG/ML PO SYRP: 8.0000 mg | ORAL_SOLUTION | Freq: Once | ORAL | Status: DC | PRN

## 2019-02-28 MED ORDER — MIDAZOLAM HCL 5 MG/5ML IJ SOLN
INTRAMUSCULAR | Status: AC
  Filled 2019-02-28: qty 5

## 2019-02-28 SURGICAL SUPPLY — 6 items
CATH PALINDROME RT-P 15FX23CM (CATHETERS) ×3 IMPLANT
DERMABOND ADVANCED (GAUZE/BANDAGES/DRESSINGS) ×2
DERMABOND ADVANCED .7 DNX12 (GAUZE/BANDAGES/DRESSINGS) ×1 IMPLANT
PACK ANGIOGRAPHY (CUSTOM PROCEDURE TRAY) ×3 IMPLANT
SUT MNCRL AB 4-0 PS2 18 (SUTURE) ×3 IMPLANT
TOWEL OR 17X26 4PK STRL BLUE (TOWEL DISPOSABLE) ×3 IMPLANT

## 2019-02-28 NOTE — Discharge Instructions (Signed)
Vascular Access for Hemodialysis        A vascular access is a connection to the blood inside your blood vessels that allows blood to be easily removed from your body and returned to your body during kidney dialysis (hemodialysis). Hemodialysis is a procedure in which a machine outside of the body filters the blood of a person whose kidneys are no longer working properly. There are three types of vascular accesses:  Arteriovenous fistula (AVF). This is a connection between an artery and a vein (usually in the arm) that is made by sewing them together. Blood in the artery flows directly into the vein, causing it to get larger over time. This makes it easier for the vein to be used for hemodialysis. An arteriovenous fistula takes 1-6 months to develop after surgery.  Arteriovenous graft (AVG). This is a connection between an artery and a vein in the arm that is made with a tube. An arteriovenous graft can be used within 2-3 weeks of surgery.  Venous catheter. This is a thin, flexible tube that is placed in a large vein (usually in the neck, chest, or groin). A venous catheter for hemodialysis contains two tubes that come out of the skin. A venous catheter can be used right away. It is usually used as a temporary access if you need hemodialysis before a fistula or graft has developed, or if kidney failure is sudden (acute) and likely to improve without the need for long-term dialysis. It may also be used as a permanent access if a fistula or graft cannot be created. Which type of access is best for me? The type of access that is best for you depends on the size and strength of your veins, your age, and any other health problems that you have, such as diabetes. An ultrasound test may be used to look at your veins to help make this decision. A fistula is usually the preferred type of access. It can last several years and is less likely than the other types of accesses to become infected or to cause a blood  clot within a blood vessel (thrombosis). However, a fistula is not an option for everyone. If your veins are not the right size or if the fistula does not develop properly, a graft may be used instead. Grafts require you to have strong veins. If your veins are not strong enough for a graft, a catheter may be used. Catheters are more likely than fistulas and grafts to become infected or to have a thrombosis. Sometimes, only one type of access is an option. Your health care provider will help you determine which type of access is best for you. How is a vascular access used? The way that the access is used depends on the type of access:  If the access is a fistula or graft, two needles are inserted through the skin into the access before each hemodialysis session. Blood leaves the body through one of the needles and travels through a tube to the hemodialysis machine (dialyzer). Then it flows through another tube and returns to the body through the second needle.  If the access is a catheter, one tube is connected directly to the tube that leads to the dialyzer, and the other tube is connected to a tube that leads away from the dialyzer. Blood leaves the body through one tube and returns to the body through the other. What problems can occur with vascular access?  A blood clot within a blood vessel (thrombosis).   Thrombosis can lead to a narrowing of a blood vessel (stenosis). If thrombosis occurs frequently, another access site may be created as a backup.  Infection.  Heart enlargement (cardiomegaly) and heart failure. Changes in blood flow may cause an increase in blood pressure or heart rate, making your heart work harder to pump blood. These problems are most likely to occur with a venous catheter and least likely to occur with an arteriovenous fistula. How do I care for my vascular access?  Wear a medical alert bracelet. In case of an emergency, this bracelet tells health care providers that you  are a dialysis patient and allows them to care for your veins appropriately. If you have a graft or fistula:  A "bruit" is a noise that is heard with a stethoscope, and a "thrill" is a vibration that is felt over the graft or fistula. The presence of the bruit and thrill indicates that the access is working. You will be taught to feel for the thrill each day. If this is not felt, the access may be clotted. Call your health care provider.  Keep your arm straight and raised (elevated) above your heart while the access site is healing.  You may freely use the arm where your vascular access is located after the site heals. Keep the following in mind: ? Avoid pressure on the arm. ? Avoid lifting heavy objects with the arm. ? Avoid sleeping on the arm. ? Avoid wearing tight-sleeved shirts or jewelry around the graft or fistula.  Do not allow blood pressure monitoring or needle punctures on the side where the graft or fistula is located.  With permission from your health care provider, you may do exercises to help with blood flow through a fistula. These exercises involve squeezing a rubber ball or other soft objects as instructed.  Wash your access site according to directions from your health care provider. If you have a venous catheter:  Keep the insertion site clean and dry at all times.  If you are told you can shower after the site heals, use a protective covering over the catheter to keep it dry.  Follow directions from your health care provider for bandage (dressing) changes.  Ask your health care provider what activities are safe for you. You may be restricted from lifting or making repetitive arm movements on the side with the catheter. Contact a health care provider if:  Swelling around the graft or fistula gets worse.  You develop new pain.  Your catheter gets damaged. Get help right away if:  You have pain, numbness, an unusual pale skin color, or blue fingers or sores at  the tips of your fingers in the hand on the side of your fistula.  You have chills.  You have a fever.  You have pus or other fluid (drainage) at the vascular access site.  You develop skin redness or red streaking on the skin around, above, or below the vascular access.  You have bleeding at the vascular access that cannot be easily controlled.  Your catheter gets pulled out of place.  You feel your heart racing or skipping beats.  You have chest pain. Summary  A vascular access is a connection to the blood inside your blood vessels that allows blood to be easily removed from your body and returned to your body during kidney dialysis (hemodialysis).  There are three types of vascular accesses.The type of access that is best for you depends on the size and strength of your   veins, your age, and any other health problems that you have, such as diabetes.  A fistula is usually the preferred type of access, although it is not an option for everyone. It can last several years and is less likely than the other types of accesses to become infected or to cause a blood clot within a blood vessel (thrombosis).  Wear a medical alert bracelet. In case of an emergency, this tells health care providers that you are a dialysis patient. This information is not intended to replace advice given to you by your health care provider. Make sure you discuss any questions you have with your health care provider. Document Released: 12/24/2002 Document Revised: 10/28/2016 Document Reviewed: 10/28/2016 Elsevier Interactive Patient Education  2019 [employer].

## 2019-02-28 NOTE — OR Nursing (Signed)
Pt reports last period one month ago, had intercourse 3 weeks ago. States she does not think she could be pregnant. Pregnancy test was not done, but shielding of abdomen performed due to fact that pt needs dialysis catheter placed.Plan for minimal radiation use.

## 2019-02-28 NOTE — H&P (Signed)
[facility] Admission History & Physical  MRN : [id number]  [name] is a 33 y.o. (1985-12-22) female who presents with chief complaint of No chief complaint on file. Marland Kitchen  History of Present Illness: Patient is sent over from her nephrologist office for placement of a PermCath.  She has advanced chronic kidney disease and was found to have a markedly reduced hemoglobin of 6, a markedly elevated creatinine, and need to initiate dialysis at this time.  She has swelling and some extra volume.  She reports lethargy and weakness with minimal activity.  Current Facility-Administered Medications  Medication Dose Route Frequency Provider Last Rate Last Dose  . 0.9 %  sodium chloride infusion   Intravenous Continuous Eulogio Ditch E, NP      . ceFAZolin (ANCEF) IVPB 1 g/50 mL premix  1 g Intravenous Once Eulogio Ditch E, NP      . diphenhydrAMINE (BENADRYL) injection 50 mg  50 mg Intravenous Once PRN Kris Hartmann, NP      . famotidine (PEPCID) tablet 40 mg  40 mg Oral Once PRN Kris Hartmann, NP      . fentaNYL (SUBLIMAZE) 100 MCG/2ML injection           . HYDROmorphone (DILAUDID) injection 1 mg  1 mg Intravenous Once PRN Eulogio Ditch E, NP      . methylPREDNISolone sodium succinate (SOLU-MEDROL) 125 mg/2 mL injection 125 mg  125 mg Intravenous Once PRN Eulogio Ditch E, NP      . midazolam (VERSED) 2 MG/ML syrup 8 mg  8 mg Oral Once PRN Kris Hartmann, NP      . midazolam (VERSED) 5 MG/5ML injection           . ondansetron (ZOFRAN) injection 4 mg  4 mg Intravenous Q6H PRN Kris Hartmann, NP        Past Medical History:  Diagnosis Date  . Anemia of chronic disease   . Asthma   . CHF (congestive heart failure) ([place])   . CKD (chronic kidney disease), stage III ([place])   . Diabetes ([place])    type 2  . Hypertension   . Morbid obesity ([place])     Past Surgical History:  Procedure Laterality Date  . TONSILLECTOMY      Social History Social History    Tobacco Use  . Smoking status: Never Smoker  . Smokeless tobacco: Never Used  Substance Use Topics  . Alcohol use: No    Alcohol/week: 0.0 standard drinks  . Drug use: No    Family History Family History  Problem Relation Age of Onset  . Hypertension Mother   . Multiple sclerosis Mother   . Cancer Maternal Grandmother        Ovarian   . Cancer Maternal Grandfather        Prostate     No Known Allergies   REVIEW OF SYSTEMS (Negative unless checked)  Constitutional: [] Weight loss  [] Fever  [] Chills Cardiac: [] Chest pain   [] Chest pressure   [] Palpitations   [] Shortness of breath when laying flat   [] Shortness of breath at rest   [x] Shortness of breath with exertion. Vascular:  [] Pain in legs with walking   [] Pain in legs at rest   [] Pain in legs when laying flat   [] Claudication   [] Pain in feet when walking  [] Pain in feet at rest  [] Pain in feet when laying flat   [] History of DVT   [] Phlebitis   [  x]Swelling in legs   [] Varicose veins   [] Non-healing ulcers Pulmonary:   [] Uses home oxygen   [] Productive cough   [] Hemoptysis   [] Wheeze  [] COPD   [] Asthma Neurologic:  [] Dizziness  [] Blackouts   [] Seizures   [] History of stroke   [] History of TIA  [] Aphasia   [] Temporary blindness   [] Dysphagia   [] Weakness or numbness in arms   [] Weakness or numbness in legs Musculoskeletal:  [] Arthritis   [] Joint swelling   [] Joint pain   [] Low back pain Hematologic:  [] Easy bruising  [] Easy bleeding   [] Hypercoagulable state   [x] Anemic  [] Hepatitis Gastrointestinal:  [] Blood in stool   [] Vomiting blood  [x] Gastroesophageal reflux/heartburn   [] Difficulty swallowing. Genitourinary:  [] Chronic kidney disease   [] Difficult urination  [] Frequent urination  [] Burning with urination   [] Blood in urine Skin:  [] Rashes   [] Ulcers   [] Wounds Psychological:  [] History of anxiety   []  History of major depression.  Physical Examination  Vitals:   02/28/19 1326  BP: (!) 171/118  Pulse: 95  Resp: 20   Temp: 97.9 F (36.6 C)  TempSrc: Oral  SpO2: 100%  Weight: (!) 154.2 kg  Height: 5\' 6"  (1.676 m)   Body mass index is 54.88 kg/m. Gen: WD/WN, NAD.  Morbidly obese   Head: [place]/AT, No temporalis wasting.  Ear/Nose/Throat: Hearing grossly intact, nares w/o erythema or drainage, oropharynx w/o Erythema/Exudate,  Eyes: Conjunctiva clear, sclera non-icteric Neck: Trachea midline.  No JVD.  Pulmonary:  Good air movement, respirations not labored, no use of accessory muscles.  Cardiac: RRR, normal S1, S2. Vascular:  Vessel Right Left  Radial Palpable Palpable   Musculoskeletal: M/S 5/5 throughout.  Extremities without ischemic changes.  No deformity or atrophy.  Neurologic: Sensation grossly intact in extremities.  Symmetrical.  Speech is fluent. Motor exam as listed above. Psychiatric: Judgment intact, Mood & affect appropriate for pt's clinical situation. Dermatologic: No rashes or ulcers noted.  No cellulitis or open wounds.      CBC Lab Results  Component Value Date   WBC 11.8 (H) 05/25/2017   HGB 12.3 05/25/2017   HCT 38.5 05/25/2017   MCV 78.5 (L) 05/25/2017   PLT 233 05/25/2017    BMET    Component Value Date/Time   NA 138 02/18/2016 1413   NA 135 (L) 12/31/2013 1020   K 3.8 02/18/2016 1413   K 3.9 12/31/2013 1020   CL 105 02/18/2016 1413   CL 105 12/31/2013 1020   CO2 24 02/18/2016 1413   CO2 26 12/31/2013 1020   GLUCOSE 123 (H) 02/18/2016 1413   GLUCOSE 243 (H) 12/31/2013 1020   BUN 51 (H) 02/18/2016 1413   BUN 19 (H) 12/31/2013 1020   CREATININE 2.56 (H) 05/25/2017 0958   CREATININE 2.12 (H) 12/31/2013 1020   CALCIUM 8.9 02/18/2016 1413   CALCIUM 8.5 12/31/2013 1020   GFRNONAA 24 (L) 05/25/2017 0958   GFRNONAA 31 (L) 12/31/2013 1020   GFRAA 27 (L) 05/25/2017 0958   GFRAA 36 (L) 12/31/2013 1020   CrCl cannot be calculated (Patient's most recent lab result is older than the maximum 21 days allowed.).  COAG Lab Results  Component Value Date   INR  1.16 02/12/2016    Radiology No results found.    Assessment/Plan 1.  ESRD.  Patient sent from nephrology for initiation of dialysis with need for a PermCath.  Will ultimately need an outpatient work-up with vein mapping for evaluation for AV fistular graft. 2.  Diabetes.  Likely  an underlying cause of her renal failure and blood glucose control important in reducing the progression of atherosclerotic disease. Also, involved in wound healing. On appropriate medications. 3. Hypertension. Likely an underlying cause of her renal failure and blood pressure control important in reducing the progression of atherosclerotic disease. On appropriate oral medications.    [name], MD  02/28/2019 2:20 PM

## 2019-02-28 NOTE — Op Note (Signed)
OPERATIVE NOTE    PRE-OPERATIVE DIAGNOSIS: 1. ESRD   POST-OPERATIVE DIAGNOSIS: same as above  PROCEDURE: 1. Ultrasound guidance for vascular access to the right internal jugular vein 2. Fluoroscopic guidance for placement of catheter 3. Placement of a 23 cm tip to cuff tunneled hemodialysis catheter via the right internal jugular vein  SURGEON: [name], MD  ANESTHESIA:  Local with Moderate conscious sedation for approximately 15 minutes using 4 mg of Versed and 100 mcg of Fentanyl  ESTIMATED BLOOD LOSS: 10 cc  FLUORO TIME: less than one minute  CONTRAST: none  FINDING(S): 1.  Patent right internal jugular vein  SPECIMEN(S):  None  INDICATIONS:   [name] is a 33 y.o.female who presents with renal failure.  The patient needs long term dialysis access for their ESRD, and a Permcath is necessary.  Risks and benefits are discussed and informed consent is obtained.    DESCRIPTION: After obtaining full informed written consent, the patient was brought back to the vascular suited. The patient's right neck and chest were sterilely prepped and draped in a sterile surgical field was created. Moderate conscious sedation was administered during a face to face encounter with the patient throughout the procedure with my supervision of the RN administering medicines and monitoring the patient's vital signs, pulse oximetry, telemetry and mental status throughout from the start of the procedure until the patient was taken to the recovery room.  The right internal jugular vein was visualized with ultrasound and found to be patent. It was then accessed under direct ultrasound guidance and a permanent image was recorded. A wire was placed. After skin nick and dilatation, the peel-away sheath was placed over the wire. I then turned my attention to an area under the clavicle. Approximately 1-2 fingerbreadths below the clavicle a small counterincision was created and tunneled from the  subclavicular incision to the access site. Using fluoroscopic guidance, a 23 centimeter tip to cuff tunneled hemodialysis catheter was selected, and tunneled from the subclavicular incision to the access site. It was then placed through the peel-away sheath and the peel-away sheath was removed. Using fluoroscopic guidance the catheter tips were parked in the right atrium. The appropriate distal connectors were placed. It withdrew blood well and flushed easily with heparinized saline and a concentrated heparin solution was then placed. It was secured to the chest wall with 2 Prolene sutures. The access incision was closed single 4-0 Monocryl. A 4-0 Monocryl pursestring suture was placed around the exit site. Sterile dressings were placed. The patient tolerated the procedure well and was taken to the recovery room in stable condition.  COMPLICATIONS: None  CONDITION: Stable  [name], MD 02/28/2019 5:04 PM   This note was created with Dragon Medical transcription system. Any errors in dictation are purely unintentional.

## 2019-03-01 ENCOUNTER — Encounter: Payer: Self-pay | Admitting: Vascular Surgery

## 2019-03-22 ENCOUNTER — Encounter (INDEPENDENT_AMBULATORY_CARE_PROVIDER_SITE_OTHER): Payer: Self-pay

## 2019-03-22 ENCOUNTER — Other Ambulatory Visit (INDEPENDENT_AMBULATORY_CARE_PROVIDER_SITE_OTHER): Payer: Self-pay

## 2019-03-22 ENCOUNTER — Ambulatory Visit (INDEPENDENT_AMBULATORY_CARE_PROVIDER_SITE_OTHER): Payer: Self-pay | Admitting: Nurse Practitioner

## 2019-03-22 ENCOUNTER — Ambulatory Visit (INDEPENDENT_AMBULATORY_CARE_PROVIDER_SITE_OTHER): Payer: Self-pay | Admitting: Vascular Surgery

## 2019-04-22 ENCOUNTER — Other Ambulatory Visit (INDEPENDENT_AMBULATORY_CARE_PROVIDER_SITE_OTHER): Payer: Self-pay | Admitting: Vascular Surgery

## 2019-04-22 DIAGNOSIS — N186 End stage renal disease: Secondary | ICD-10-CM

## 2019-04-26 ENCOUNTER — Other Ambulatory Visit (INDEPENDENT_AMBULATORY_CARE_PROVIDER_SITE_OTHER): Payer: Self-pay

## 2019-04-26 ENCOUNTER — Ambulatory Visit (INDEPENDENT_AMBULATORY_CARE_PROVIDER_SITE_OTHER): Payer: Self-pay | Admitting: Nurse Practitioner

## 2019-04-26 ENCOUNTER — Encounter (INDEPENDENT_AMBULATORY_CARE_PROVIDER_SITE_OTHER): Payer: Self-pay

## 2019-05-02 DIAGNOSIS — N186 End stage renal disease: Secondary | ICD-10-CM | POA: Insufficient documentation

## 2019-05-02 DIAGNOSIS — I10 Essential (primary) hypertension: Secondary | ICD-10-CM | POA: Insufficient documentation

## 2019-05-20 ENCOUNTER — Other Ambulatory Visit (INDEPENDENT_AMBULATORY_CARE_PROVIDER_SITE_OTHER): Payer: Self-pay | Admitting: Nephrology

## 2019-05-20 ENCOUNTER — Ambulatory Visit (INDEPENDENT_AMBULATORY_CARE_PROVIDER_SITE_OTHER): Payer: Self-pay

## 2019-05-20 ENCOUNTER — Other Ambulatory Visit: Payer: Self-pay

## 2019-05-20 DIAGNOSIS — M79605 Pain in left leg: Secondary | ICD-10-CM

## 2019-05-20 DIAGNOSIS — M7989 Other specified soft tissue disorders: Secondary | ICD-10-CM

## 2019-05-21 HISTORY — PX: OTHER SURGICAL HISTORY: SHX169

## 2019-07-02 ENCOUNTER — Other Ambulatory Visit: Payer: Self-pay

## 2019-07-02 ENCOUNTER — Encounter: Payer: Self-pay | Admitting: *Deleted

## 2019-07-02 ENCOUNTER — Inpatient Hospital Stay
Admission: EM | Admit: 2019-07-02 | Discharge: 2019-07-10 | DRG: 871 | Disposition: A | Discharge status: Home / Self Care | Payer: Medicare Other | Attending: Internal Medicine | Admitting: Internal Medicine

## 2019-07-02 ENCOUNTER — Emergency Department: Payer: Medicare Other

## 2019-07-02 ENCOUNTER — Inpatient Hospital Stay: Payer: Medicare Other

## 2019-07-02 DIAGNOSIS — Z79899 Other long term (current) drug therapy: Secondary | ICD-10-CM

## 2019-07-02 DIAGNOSIS — L918 Other hypertrophic disorders of the skin: Secondary | ICD-10-CM | POA: Diagnosis not present

## 2019-07-02 DIAGNOSIS — E875 Hyperkalemia: Secondary | ICD-10-CM | POA: Diagnosis not present

## 2019-07-02 DIAGNOSIS — L03115 Cellulitis of right lower limb: Secondary | ICD-10-CM | POA: Diagnosis present

## 2019-07-02 DIAGNOSIS — I96 Gangrene, not elsewhere classified: Secondary | ICD-10-CM | POA: Diagnosis not present

## 2019-07-02 DIAGNOSIS — Z6841 Body Mass Index (BMI) 40.0 and over, adult: Secondary | ICD-10-CM

## 2019-07-02 DIAGNOSIS — Z7982 Long term (current) use of aspirin: Secondary | ICD-10-CM

## 2019-07-02 DIAGNOSIS — Z992 Dependence on renal dialysis: Secondary | ICD-10-CM | POA: Diagnosis not present

## 2019-07-02 DIAGNOSIS — Z20828 Contact with and (suspected) exposure to other viral communicable diseases: Secondary | ICD-10-CM | POA: Diagnosis present

## 2019-07-02 DIAGNOSIS — E1152 Type 2 diabetes mellitus with diabetic peripheral angiopathy with gangrene: Secondary | ICD-10-CM | POA: Diagnosis present

## 2019-07-02 DIAGNOSIS — L97219 Non-pressure chronic ulcer of right calf with unspecified severity: Secondary | ICD-10-CM | POA: Diagnosis present

## 2019-07-02 DIAGNOSIS — N186 End stage renal disease: Secondary | ICD-10-CM | POA: Diagnosis not present

## 2019-07-02 DIAGNOSIS — I959 Hypotension, unspecified: Secondary | ICD-10-CM | POA: Diagnosis not present

## 2019-07-02 DIAGNOSIS — I82409 Acute embolism and thrombosis of unspecified deep veins of unspecified lower extremity: Secondary | ICD-10-CM | POA: Diagnosis not present

## 2019-07-02 DIAGNOSIS — Z8249 Family history of ischemic heart disease and other diseases of the circulatory system: Secondary | ICD-10-CM

## 2019-07-02 DIAGNOSIS — I132 Hypertensive heart and chronic kidney disease with heart failure and with stage 5 chronic kidney disease, or end stage renal disease: Secondary | ICD-10-CM | POA: Diagnosis present

## 2019-07-02 DIAGNOSIS — M7989 Other specified soft tissue disorders: Secondary | ICD-10-CM | POA: Diagnosis present

## 2019-07-02 DIAGNOSIS — Z8041 Family history of malignant neoplasm of ovary: Secondary | ICD-10-CM

## 2019-07-02 DIAGNOSIS — I5023 Acute on chronic systolic (congestive) heart failure: Secondary | ICD-10-CM | POA: Diagnosis present

## 2019-07-02 DIAGNOSIS — R0989 Other specified symptoms and signs involving the circulatory and respiratory systems: Secondary | ICD-10-CM

## 2019-07-02 DIAGNOSIS — L03116 Cellulitis of left lower limb: Secondary | ICD-10-CM | POA: Diagnosis present

## 2019-07-02 DIAGNOSIS — Z820 Family history of epilepsy and other diseases of the nervous system: Secondary | ICD-10-CM | POA: Diagnosis not present

## 2019-07-02 DIAGNOSIS — R600 Localized edema: Secondary | ICD-10-CM

## 2019-07-02 DIAGNOSIS — L118 Other specified acantholytic disorders: Secondary | ICD-10-CM | POA: Diagnosis not present

## 2019-07-02 DIAGNOSIS — I120 Hypertensive chronic kidney disease with stage 5 chronic kidney disease or end stage renal disease: Secondary | ICD-10-CM | POA: Diagnosis not present

## 2019-07-02 DIAGNOSIS — E11622 Type 2 diabetes mellitus with other skin ulcer: Secondary | ICD-10-CM | POA: Diagnosis present

## 2019-07-02 DIAGNOSIS — J45909 Unspecified asthma, uncomplicated: Secondary | ICD-10-CM | POA: Diagnosis present

## 2019-07-02 DIAGNOSIS — A419 Sepsis, unspecified organism: Secondary | ICD-10-CM | POA: Diagnosis present

## 2019-07-02 DIAGNOSIS — Z8042 Family history of malignant neoplasm of prostate: Secondary | ICD-10-CM

## 2019-07-02 DIAGNOSIS — D631 Anemia in chronic kidney disease: Secondary | ICD-10-CM | POA: Diagnosis present

## 2019-07-02 DIAGNOSIS — E1122 Type 2 diabetes mellitus with diabetic chronic kidney disease: Secondary | ICD-10-CM | POA: Diagnosis present

## 2019-07-02 DIAGNOSIS — L989 Disorder of the skin and subcutaneous tissue, unspecified: Secondary | ICD-10-CM | POA: Diagnosis not present

## 2019-07-02 LAB — CBC
HCT: 28.3 % — ABNORMAL LOW (ref 36.0–46.0)
Hemoglobin: 8.8 g/dL — ABNORMAL LOW (ref 12.0–15.0)
MCH: 27.6 pg (ref 26.0–34.0)
MCHC: 31.1 g/dL (ref 30.0–36.0)
MCV: 88.7 fL (ref 80.0–100.0)
Platelets: 327 10*3/uL (ref 150–400)
RBC: 3.19 MIL/uL — ABNORMAL LOW (ref 3.87–5.11)
RDW: 18.1 % — ABNORMAL HIGH (ref 11.5–15.5)
WBC: 24.1 10*3/uL — ABNORMAL HIGH (ref 4.0–10.5)
nRBC: 0 % (ref 0.0–0.2)

## 2019-07-02 LAB — LACTIC ACID, PLASMA
Lactic Acid, Venous: 1.2 mmol/L (ref 0.5–1.9)
Lactic Acid, Venous: 1.1 mmol/L (ref 0.5–1.9)

## 2019-07-02 LAB — BASIC METABOLIC PANEL
Anion gap: 18 — ABNORMAL HIGH (ref 5–15)
BUN: 69 mg/dL — ABNORMAL HIGH (ref 6–20)
CO2: 21 mmol/L — ABNORMAL LOW (ref 22–32)
Calcium: 8.3 mg/dL — ABNORMAL LOW (ref 8.9–10.3)
Chloride: 100 mmol/L (ref 98–111)
Creatinine, Ser: 12.98 mg/dL — ABNORMAL HIGH (ref 0.44–1.00)
GFR calc Af Amer: 4 mL/min — ABNORMAL LOW (ref >60)
GFR calc non Af Amer: 3 mL/min — ABNORMAL LOW (ref >60)
Glucose, Bld: 104 mg/dL — ABNORMAL HIGH (ref 70–99)
Potassium: 4.6 mmol/L (ref 3.5–5.1)
Sodium: 139 mmol/L (ref 135–145)

## 2019-07-02 LAB — PROTIME-INR
INR: 1.1 (ref 0.8–1.2)
Prothrombin Time: 14.4 seconds (ref 11.4–15.2)

## 2019-07-02 IMAGING — US US EXTREM LOW VENOUS
1 series · 13 of 24 positions shown · non-contrast
Comparison: None.

CLINICAL DATA: Bilateral leg swelling for 1 month



[Series 1: us extrem low venous · 13 of 58 slices shown]
[im 1/58]
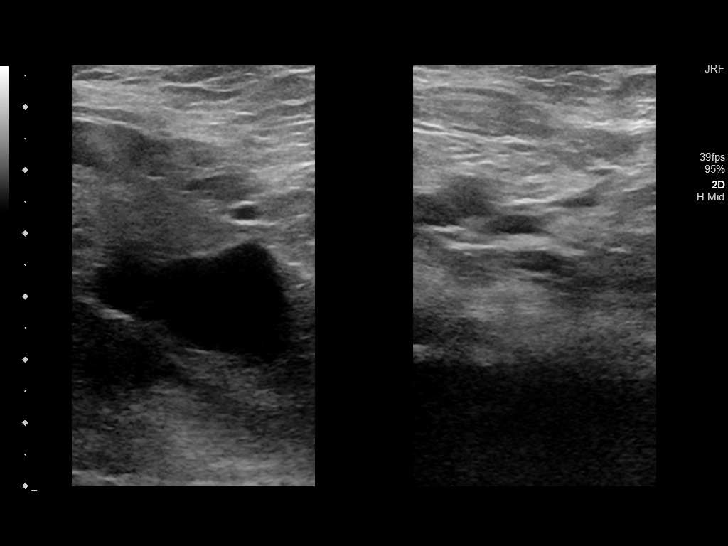
[im 5/58]
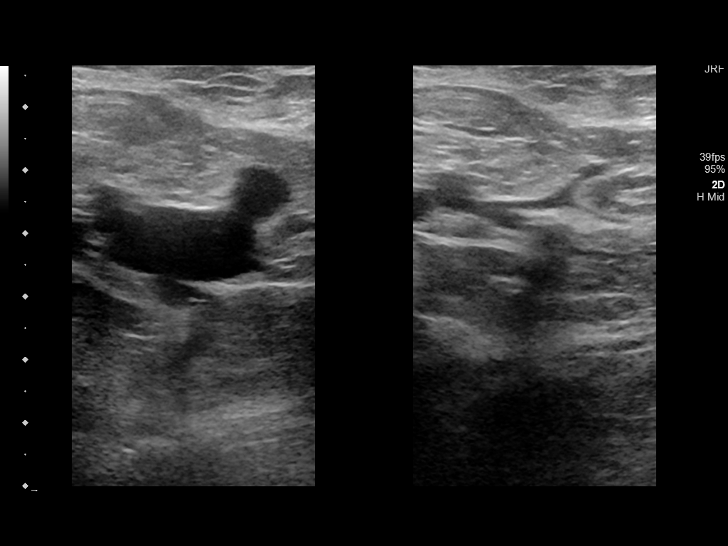
[im 10/58]
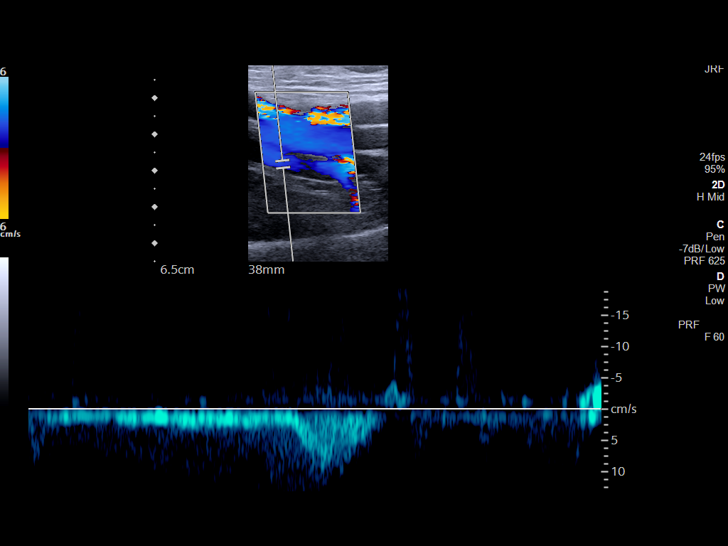
[im 15/58]
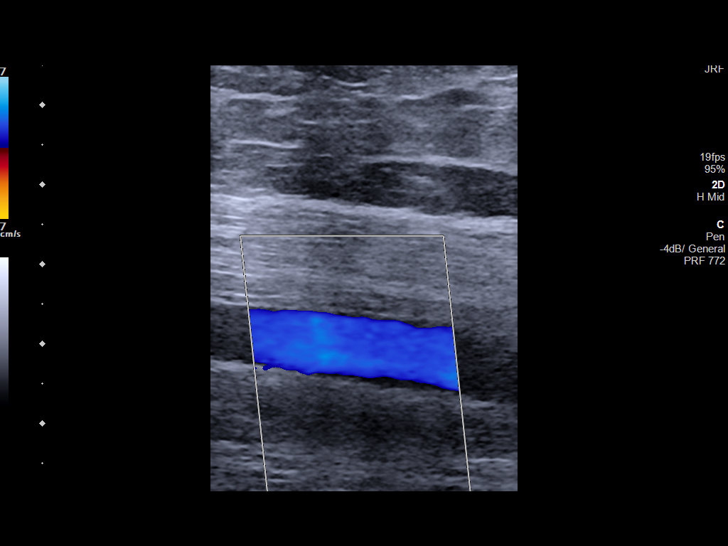
[im 20/58]
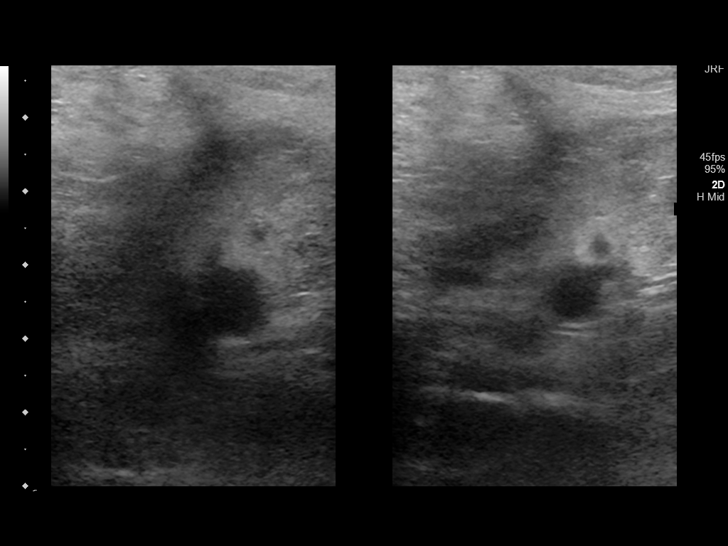
[im 25/58]
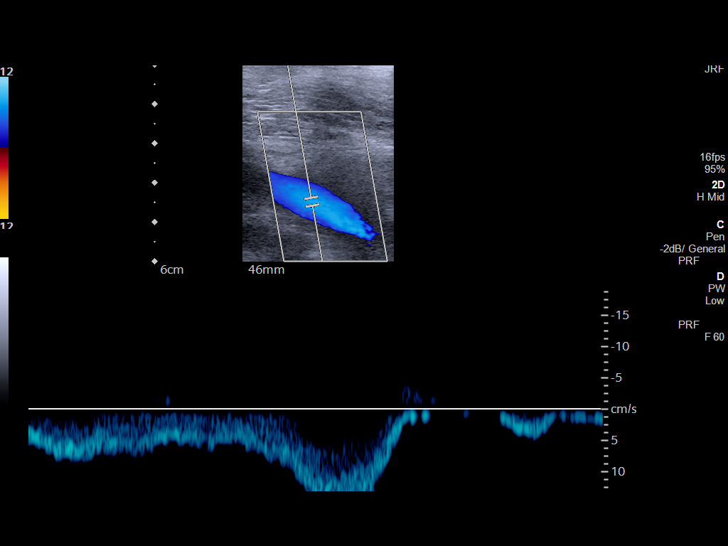
[im 30/58]
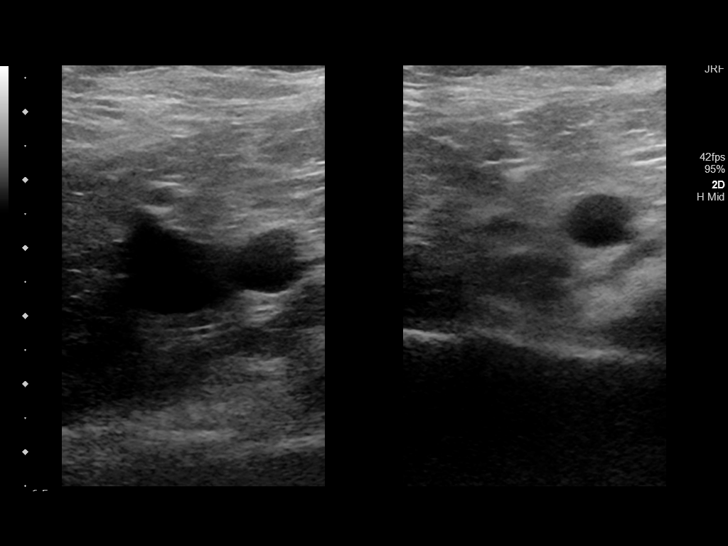
[im 33/58]
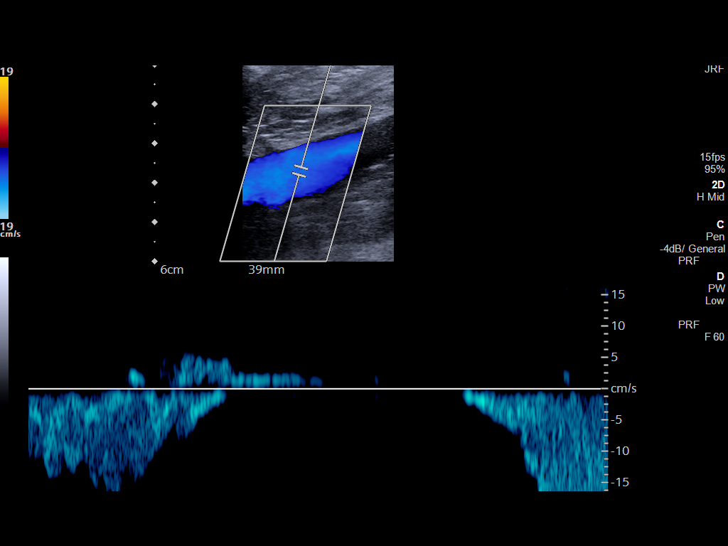
[im 38/58]
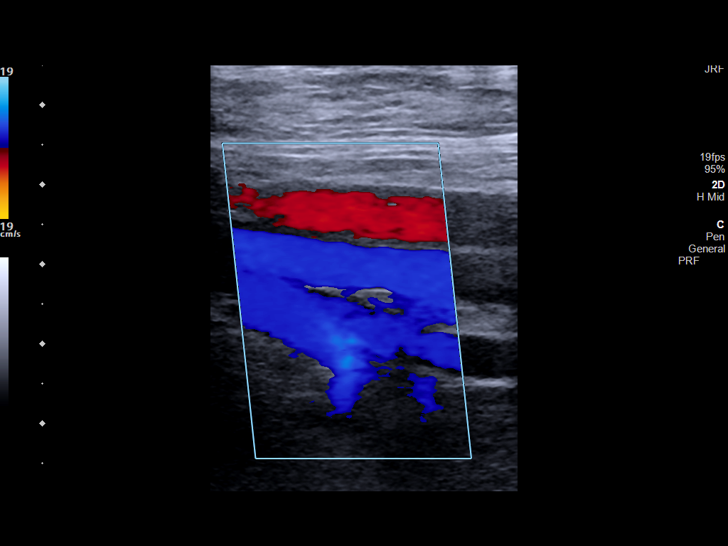
[im 43/58]
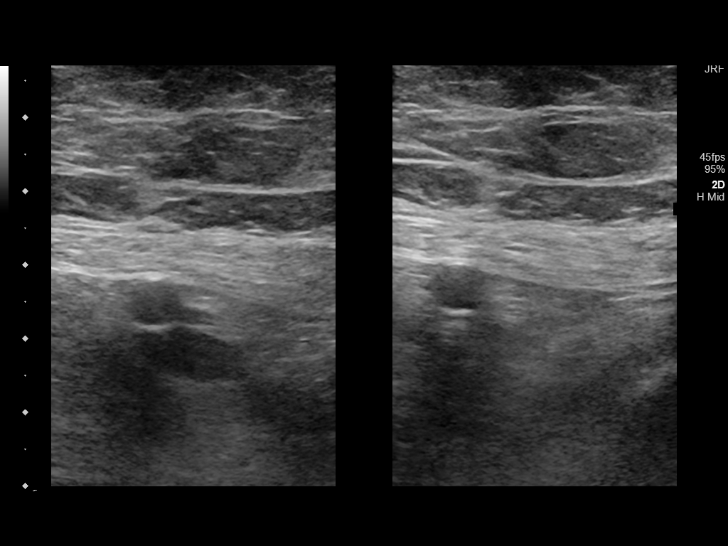
[im 48/58]
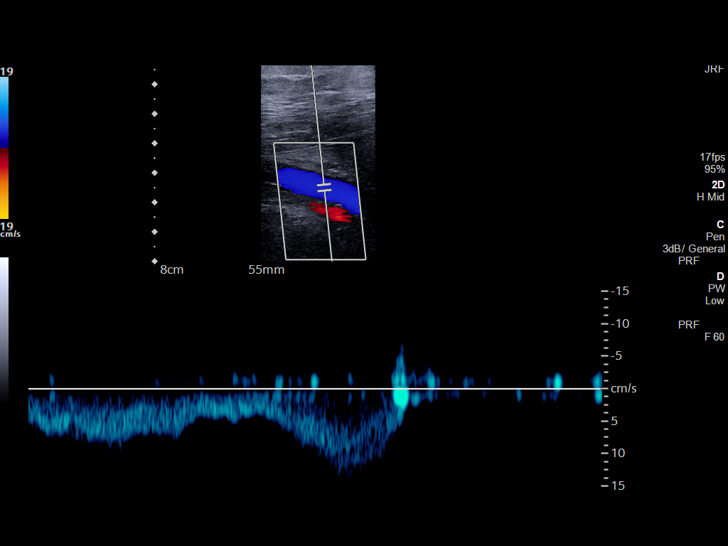
[im 53/58]
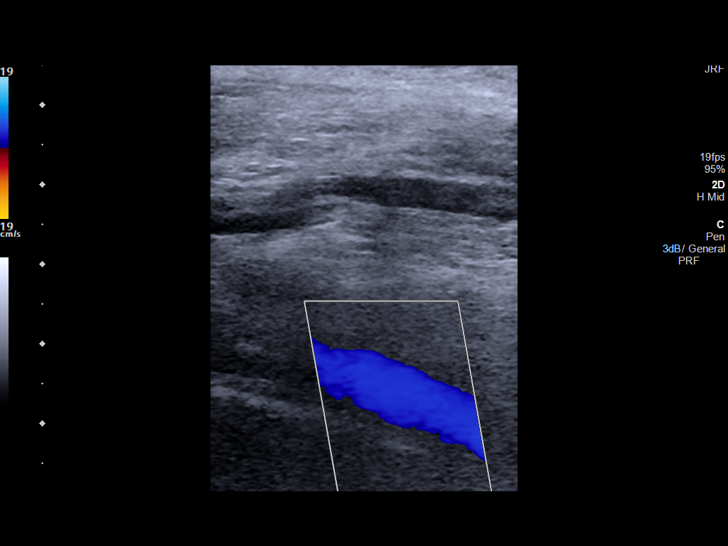
[im 58/58]
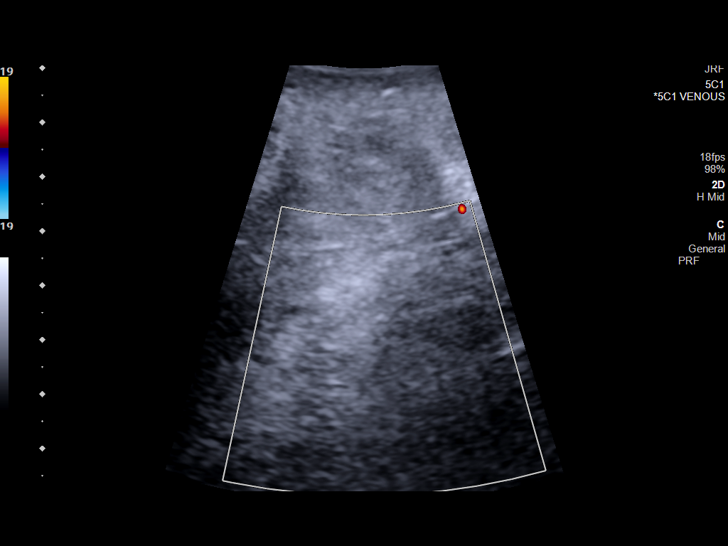

[13 of 24 positions shown; findings below may reference images not displayed]

FINDINGS: RIGHT LOWER EXTREMITY

Common Femoral Vein: No evidence of thrombus. Normal
compressibility, respiratory phasicity and response to augmentation.

Saphenofemoral Junction: No evidence of thrombus. Normal
compressibility and flow on color Doppler imaging.

Profunda Femoral Vein: No evidence of thrombus. Normal
compressibility and flow on color Doppler imaging.

Femoral Vein: No evidence of thrombus. Normal compressibility,
respiratory phasicity and response to augmentation.

Popliteal Vein: No evidence of thrombus. Normal compressibility,
respiratory phasicity and response to augmentation.

Calf Veins: Calf veins are difficult to visualize due to underlying
edema.

Superficial Great Saphenous Vein: No evidence of thrombus. Normal
compressibility.

Venous Reflux:  None.

Other Findings:  None.

LEFT LOWER EXTREMITY

Common Femoral Vein: No evidence of thrombus. Normal
compressibility, respiratory phasicity and response to augmentation.

Saphenofemoral Junction: No evidence of thrombus. Normal
compressibility and flow on color Doppler imaging.

Profunda Femoral Vein: No evidence of thrombus. Normal
compressibility and flow on color Doppler imaging.

Femoral Vein: No evidence of thrombus. Normal compressibility,
respiratory phasicity and response to augmentation.

Popliteal Vein: No evidence of thrombus. Normal compressibility,
respiratory phasicity and response to augmentation.

Calf Veins: Calf veins are difficult to visualize due to underlying
edema

Superficial Great Saphenous Vein: No evidence of thrombus. Normal
compressibility.

Venous Reflux:  None.

Other Findings:  None.
IMPRESSION: No central deep venous thrombosis is noted. The calf veins are
difficult to visualize.

## 2019-07-02 IMAGING — DX DG CHEST 1V PORT
1 series · 1 of 1 positions shown · non-contrast
Comparison: [DATE]

CLINICAL DATA: Peripheral edema

EXAM:
PORTABLE CHEST 1 VIEW

[chest ap]
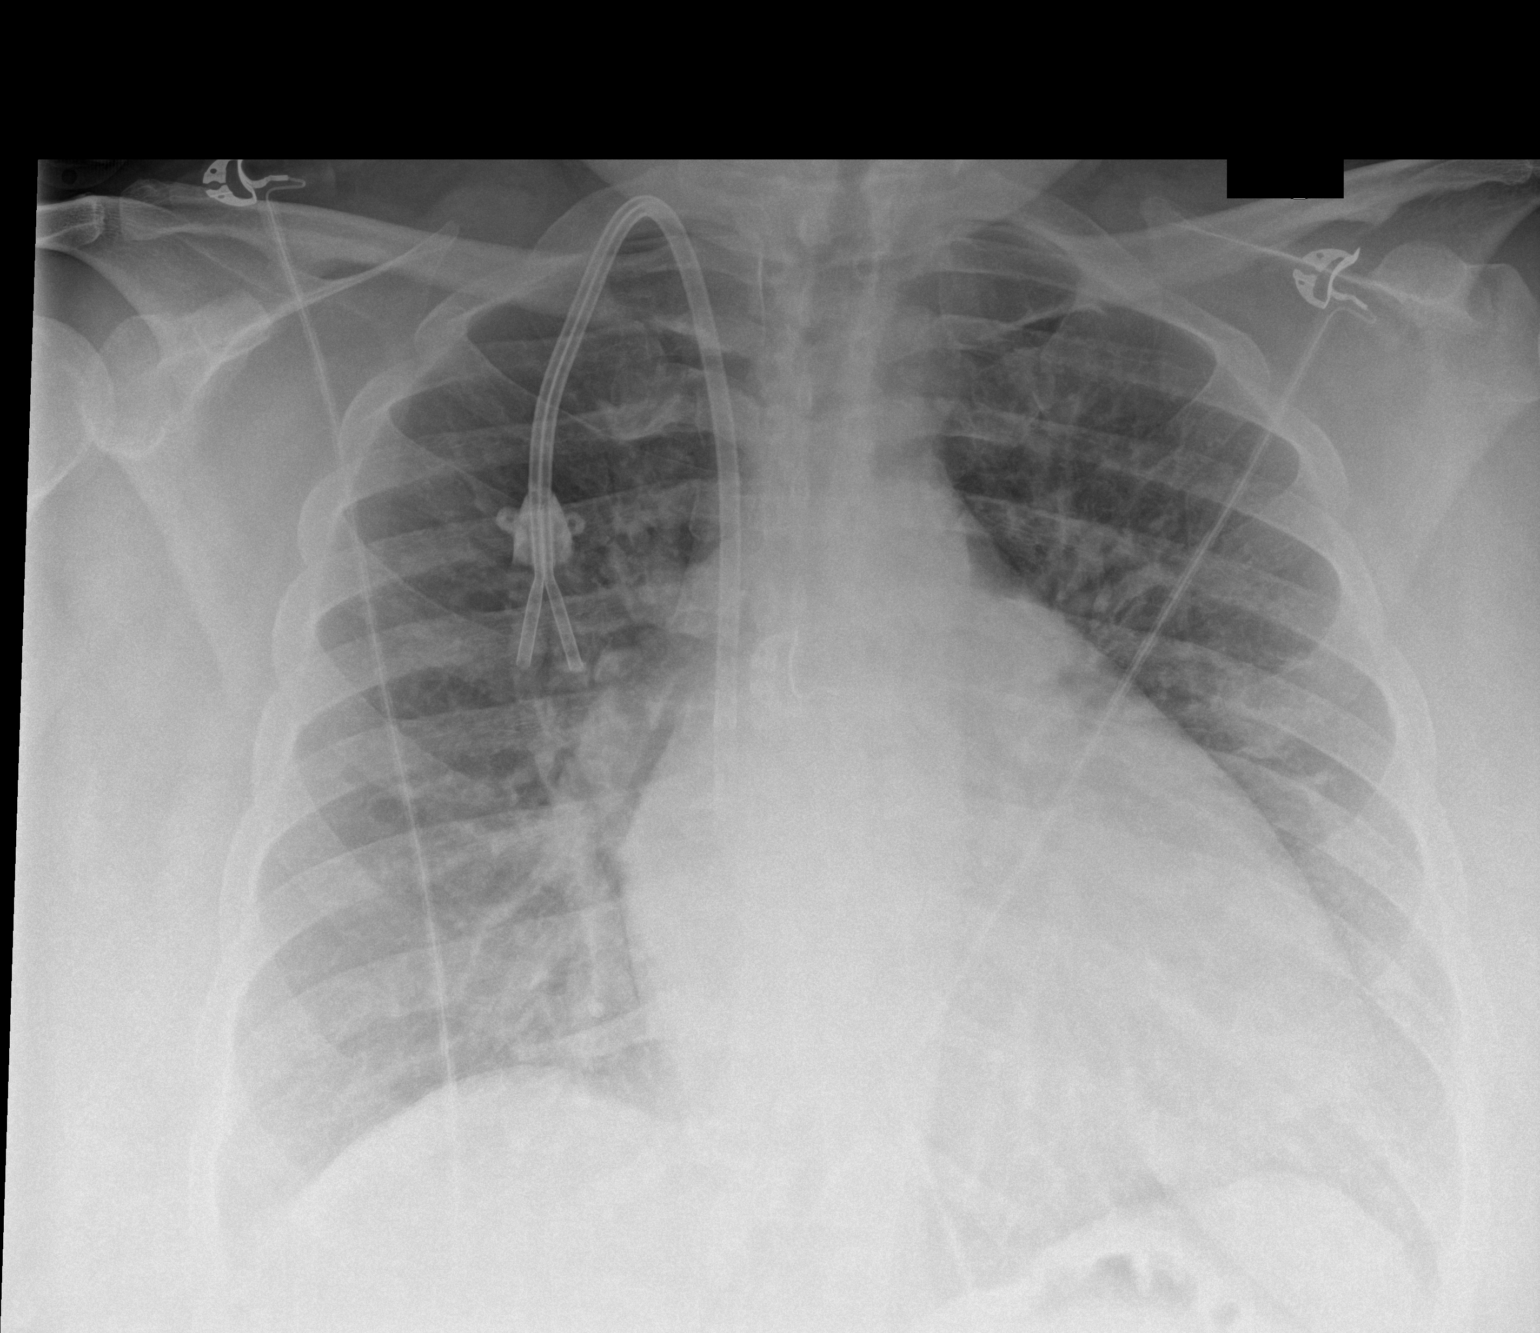

[1 of 1 positions shown; findings below may reference images not displayed]

FINDINGS: Cardiac shadow is mildly prominent. Dialysis catheter is again seen.
Vascular congestion is noted likely related to volume overload. No
focal infiltrate or effusion is seen.
IMPRESSION: Vascular congestion likely related to volume overload.

## 2019-07-02 IMAGING — DX DG TIBIA/FIBULA 2V*R*
2 series · 2 of 2 positions shown · non-contrast
Comparison: None.

CLINICAL DATA: Right lower leg swelling and bruising. No known
injury.

EXAM:
RIGHT TIBIA AND FIBULA - 2 VIEW

[tibia ap]
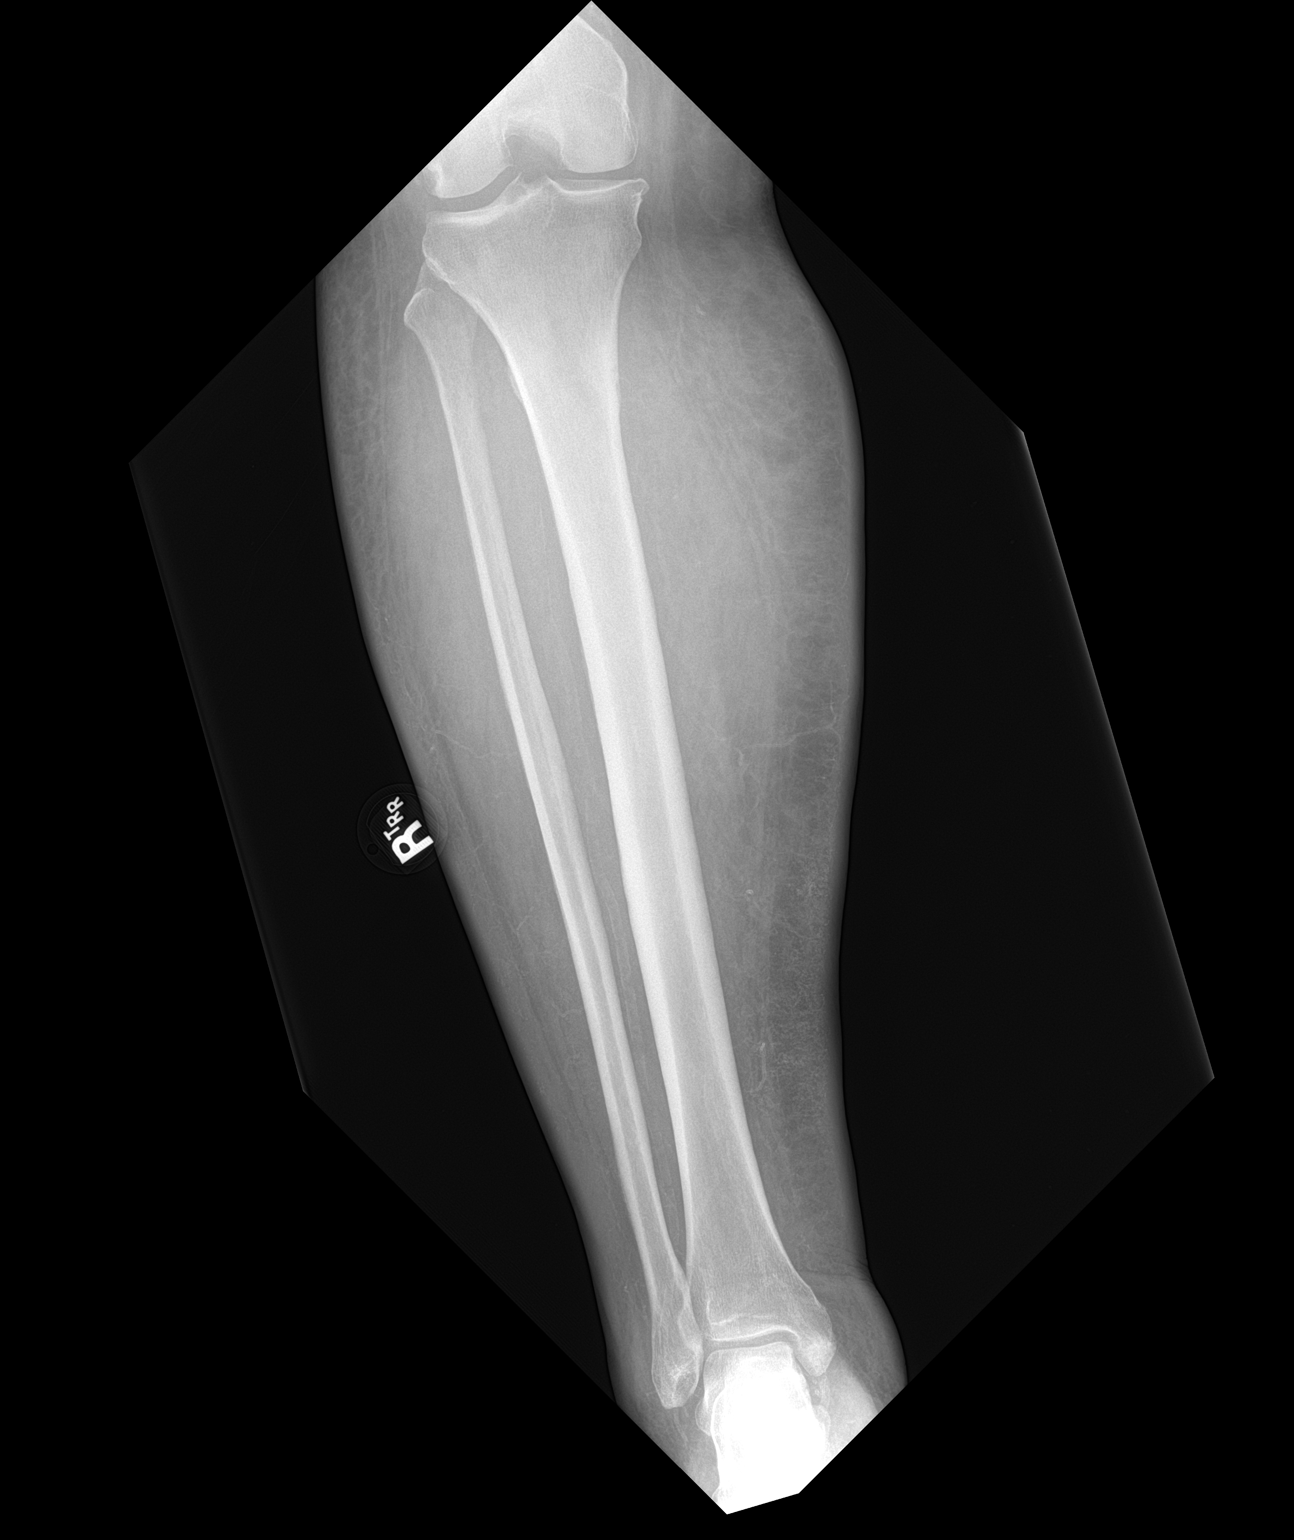

[tibia lat]
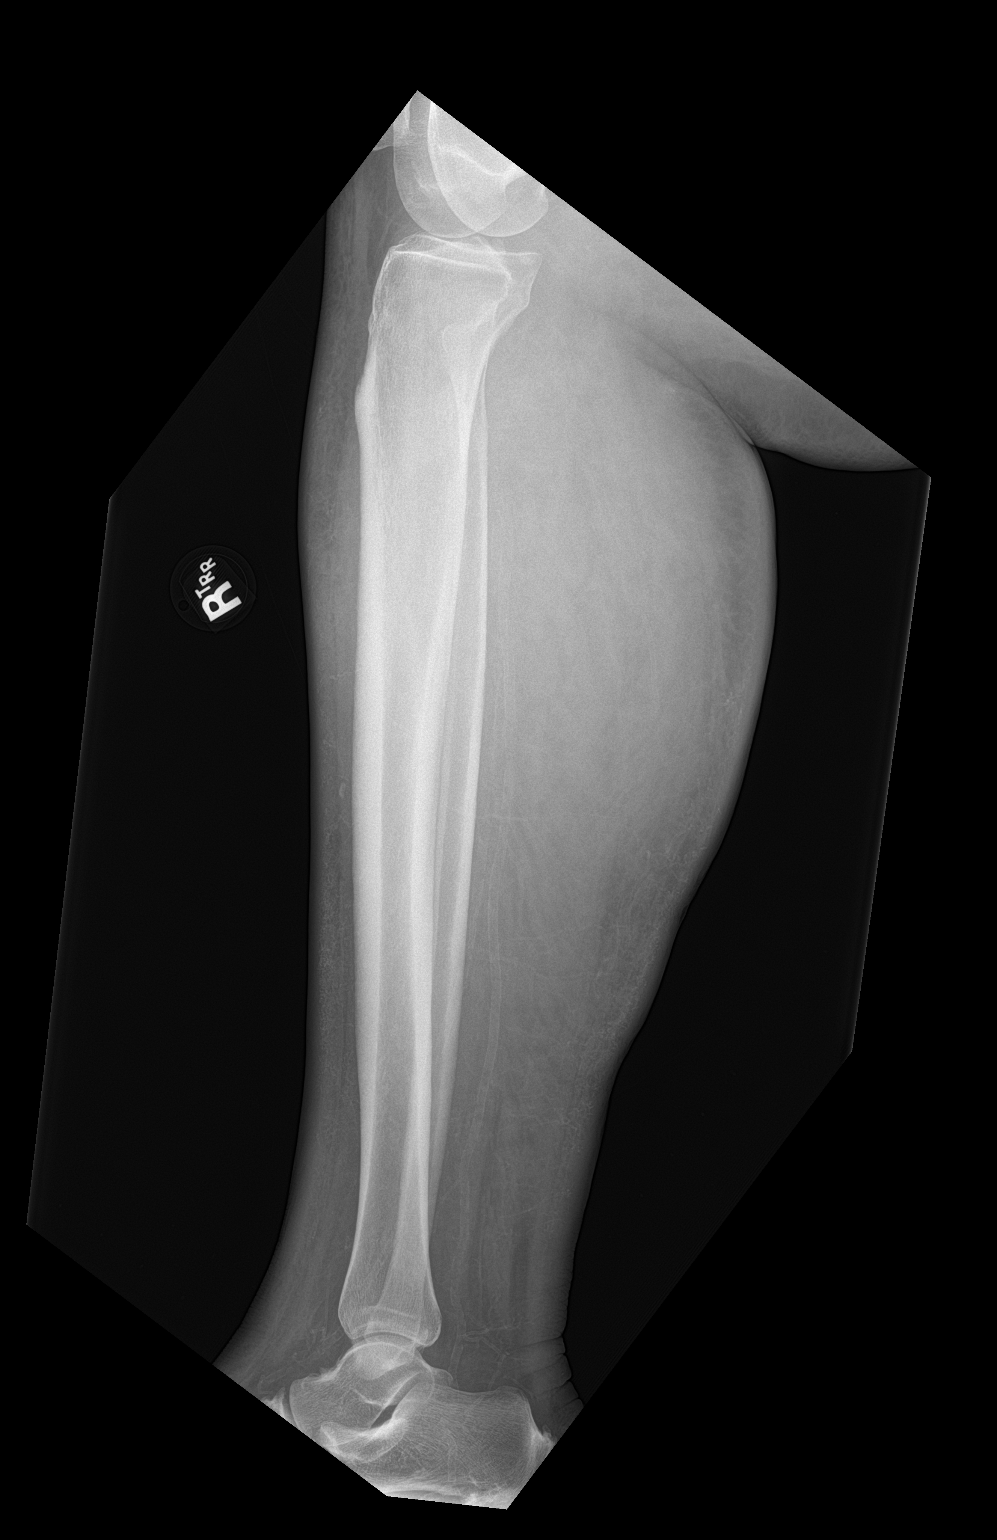

[2 of 2 positions shown; findings below may reference images not displayed]

FINDINGS: There is no evidence of fracture or other focal bone lesions. Soft
tissues are unremarkable.
IMPRESSION: Negative exam.

## 2019-07-02 MED ORDER — DIPHENHYDRAMINE HCL 50 MG/ML IJ SOLN
25.0000 mg | Freq: Once | INTRAMUSCULAR | Status: AC
  Administered 2019-07-02: 25 mg via INTRAVENOUS

## 2019-07-02 MED ORDER — VANCOMYCIN HCL 10 G IV SOLR
2500.0000 mg | Freq: Once | INTRAVENOUS | Status: AC
  Administered 2019-07-02: 23:00:00 2500 mg via INTRAVENOUS
  Filled 2019-07-02: qty 2500

## 2019-07-02 MED ORDER — AMLODIPINE BESYLATE 10 MG PO TABS
10.0000 mg | ORAL_TABLET | Freq: Every day | ORAL | Status: DC
  Administered 2019-07-03 – 2019-07-05 (×2): 10 mg via ORAL
  Filled 2019-07-02 (×2): qty 1

## 2019-07-02 MED ORDER — SODIUM CHLORIDE 0.9 % IV SOLN: 2.0000 g | Freq: Once | INTRAVENOUS | Status: DC

## 2019-07-02 MED ORDER — SODIUM CHLORIDE 0.9 % IV SOLN
2.0000 g | Freq: Once | INTRAVENOUS | Status: AC
  Administered 2019-07-02: 2 g via INTRAVENOUS
  Filled 2019-07-02: qty 2

## 2019-07-02 MED ORDER — CLONIDINE HCL 0.1 MG PO TABS
0.1000 mg | ORAL_TABLET | Freq: Three times a day (TID) | ORAL | Status: DC
  Administered 2019-07-02 – 2019-07-05 (×8): 0.1 mg via ORAL
  Filled 2019-07-02 (×9): qty 1

## 2019-07-02 MED ORDER — HYDROMORPHONE HCL 1 MG/ML IJ SOLN
1.0000 mg | INTRAMUSCULAR | Status: DC | PRN
  Administered 2019-07-03 – 2019-07-09 (×15): 1 mg via INTRAVENOUS
  Filled 2019-07-02 (×17): qty 1

## 2019-07-02 MED ORDER — ASPIRIN EC 81 MG PO TBEC
81.0000 mg | DELAYED_RELEASE_TABLET | Freq: Every day | ORAL | Status: DC
  Administered 2019-07-03 – 2019-07-10 (×7): 81 mg via ORAL
  Filled 2019-07-02 (×7): qty 1

## 2019-07-02 MED ORDER — CARVEDILOL 25 MG PO TABS
25.0000 mg | ORAL_TABLET | Freq: Two times a day (BID) | ORAL | Status: DC
  Administered 2019-07-03 – 2019-07-10 (×9): 25 mg via ORAL
  Filled 2019-07-02 (×11): qty 1

## 2019-07-02 MED ORDER — INSULIN ASPART 100 UNIT/ML ~~LOC~~ SOLN: 0.0000 [IU] | Freq: Three times a day (TID) | SUBCUTANEOUS | Status: DC

## 2019-07-02 MED ORDER — DIPHENHYDRAMINE HCL 50 MG/ML IJ SOLN
INTRAMUSCULAR | Status: AC
  Filled 2019-07-02: qty 1

## 2019-07-02 MED ORDER — INSULIN ASPART 100 UNIT/ML ~~LOC~~ SOLN: 0.0000 [IU] | Freq: Every day | SUBCUTANEOUS | Status: DC

## 2019-07-02 MED ORDER — VANCOMYCIN HCL IN DEXTROSE 1-5 GM/200ML-% IV SOLN
1000.0000 mg | Freq: Once | INTRAVENOUS | Status: DC
  Filled 2019-07-02: qty 200

## 2019-07-02 MED ORDER — LABETALOL HCL 5 MG/ML IV SOLN
10.0000 mg | INTRAVENOUS | Status: DC | PRN
  Administered 2019-07-03: 10 mg via INTRAVENOUS
  Filled 2019-07-02: qty 4

## 2019-07-02 MED ORDER — VANCOMYCIN HCL IN DEXTROSE 1-5 GM/200ML-% IV SOLN: 1000.0000 mg | Freq: Once | INTRAVENOUS | Status: DC

## 2019-07-02 MED ORDER — METRONIDAZOLE IN NACL 5-0.79 MG/ML-% IV SOLN
500.0000 mg | Freq: Once | INTRAVENOUS | Status: AC
  Administered 2019-07-02: 500 mg via INTRAVENOUS
  Filled 2019-07-02: qty 100

## 2019-07-02 MED ORDER — CALCIUM ACETATE (PHOS BINDER) 667 MG PO CAPS
1334.0000 mg | ORAL_CAPSULE | Freq: Three times a day (TID) | ORAL | Status: DC
  Administered 2019-07-03 – 2019-07-09 (×14): 1334 mg via ORAL
  Filled 2019-07-02 (×15): qty 2

## 2019-07-02 MED ORDER — ISOSORBIDE MONONITRATE ER 30 MG PO TB24
30.0000 mg | ORAL_TABLET | Freq: Two times a day (BID) | ORAL | Status: DC
  Administered 2019-07-02 – 2019-07-10 (×14): 30 mg via ORAL
  Filled 2019-07-02 (×14): qty 1

## 2019-07-02 MED ORDER — HYDROMORPHONE HCL 1 MG/ML IJ SOLN
1.0000 mg | Freq: Once | INTRAMUSCULAR | Status: AC
  Administered 2019-07-02: 23:00:00 1 mg via INTRAVENOUS
  Filled 2019-07-02: qty 1

## 2019-07-02 MED ORDER — TORSEMIDE 20 MG PO TABS
40.0000 mg | ORAL_TABLET | Freq: Every day | ORAL | Status: DC
  Administered 2019-07-03 – 2019-07-10 (×7): 40 mg via ORAL
  Filled 2019-07-02 (×8): qty 2

## 2019-07-02 MED ORDER — HEPARIN SODIUM (PORCINE) 5000 UNIT/ML IJ SOLN
5000.0000 [IU] | Freq: Three times a day (TID) | INTRAMUSCULAR | Status: DC
  Administered 2019-07-03 – 2019-07-04 (×3): 5000 [IU] via SUBCUTANEOUS
  Filled 2019-07-02 (×4): qty 1

## 2019-07-02 MED ORDER — OXYCODONE-ACETAMINOPHEN 5-325 MG PO TABS
1.0000 | ORAL_TABLET | Freq: Once | ORAL | Status: AC
  Administered 2019-07-02: 1 via ORAL
  Filled 2019-07-02: qty 1

## 2019-07-02 MED ORDER — HYDROXYZINE HCL 25 MG PO TABS: 25.0000 mg | ORAL_TABLET | Freq: Two times a day (BID) | ORAL | Status: DC | PRN

## 2019-07-02 MED ORDER — METRONIDAZOLE IN NACL 5-0.79 MG/ML-% IV SOLN
500.0000 mg | Freq: Three times a day (TID) | INTRAVENOUS | Status: DC
  Administered 2019-07-03 – 2019-07-08 (×14): 500 mg via INTRAVENOUS
  Filled 2019-07-02 (×18): qty 100

## 2019-07-02 MED ORDER — ACETAMINOPHEN 500 MG PO TABS
1000.0000 mg | ORAL_TABLET | Freq: Once | ORAL | Status: AC
  Administered 2019-07-02: 22:00:00 1000 mg via ORAL
  Filled 2019-07-02: qty 2

## 2019-07-02 NOTE — ED Notes (Signed)
Spoke with admitting doctor regarding BP concerns, admitting doctor to speak with med surg charge nurse

## 2019-07-02 NOTE — ED Notes (Addendum)
Ok to start antibiotics per Dr. Jari Pigg with one set of blood cultures. Attempted IV access lab draw without success.

## 2019-07-02 NOTE — ED Triage Notes (Signed)
Pt to ED reporting bilateral leg swelling and bruising with pt reporting she has been able to "rub" her skin off which has caused abrasions and ulcers to the back of right calf. Pt rpeorts the swelling has been worsening for the past month. Dialysis patient, last dialysis was Saturday. Pt scheduled to go again tomorrow.   Pt tearful with leg pain.

## 2019-07-02 NOTE — ED Provider Notes (Signed)
[facility] Emergency Department Provider Note  ____________________________________________   First MD Initiated Contact with Patient 07/02/19 2040     (approximate)  I have reviewed the triage vital signs and the nursing notes.   HISTORY  Chief Complaint Leg Swelling and Bleeding/Bruising    HPI [name] is a 33 y.o. female with ESRD who presents with concerns for leg swelling.  Pt has baseline swollen leg but has been rx with doxy for left lower leg redness recently.  She also had some skin breakdown on the right lower leg with now scabs on it.  Endorses feeling warm in that leg with pain that is moderate, constant, burning, nothing makes it better or worse.  Denies dysuria, SOB, abd pain. No known covid contacts.  Did miss dialysis today. Last dialysis on Saturday.           Past Medical History:  Diagnosis Date  . Anemia of chronic disease   . Asthma   . CHF (congestive heart failure) ([place])   . CKD (chronic kidney disease), stage III ([place])   . Diabetes ([place])    type 2  . Hypertension   . Morbid obesity [facility])     Patient Active Problem List   Diagnosis Date Noted  . HTN (hypertension) 03/11/2016  . Renal insufficiency 03/11/2016  . Menorrhagia   . Nephrotic syndrome   . Anemia in chronic kidney disease   . Iron deficiency anemia 02/12/2016  . CHF (congestive heart failure) ([place]) 02/11/2016    Past Surgical History:  Procedure Laterality Date  . DIALYSIS/PERMA CATHETER INSERTION N/A 02/28/2019   Procedure: DIALYSIS/PERMA CATHETER INSERTION;  Surgeon: Algernon Huxley, MD;  Location: [facility];  Service: Cardiovascular;  Laterality: N/A;  . TONSILLECTOMY      Prior to Admission medications   Medication Sig Start Date End Date Taking? Authorizing Provider  amLODipine (NORVASC) 10 MG tablet Take 10 mg by mouth daily.   Yes [provider]  aspirin EC 81 MG EC tablet Take 1 tablet (81 mg total) by mouth daily.  02/16/16  Yes Demetrios Loll, MD  calcium acetate (PHOSLO) 667 MG capsule Take 1,334 mg by mouth 3 (three) times daily with meals.   Yes [provider]  carvedilol (COREG) 25 MG tablet Take 1 tablet (25 mg total) by mouth 2 (two) times daily with a meal. 02/20/18  Yes Darylene Price A, FNP  cloNIDine (CATAPRES) 0.1 MG tablet Take 0.1 mg by mouth 3 (three) times daily.   Yes [provider]  hydrOXYzine (ATARAX/VISTARIL) 25 MG tablet Take 25 mg by mouth 2 (two) times daily as needed for itching. 05/09/19  Yes [provider]  isosorbide mononitrate (IMDUR) 30 MG 24 hr tablet Take 1 tablet (30 mg total) by mouth 2 (two) times daily. 02/20/18  Yes Hackney, Otila Kluver A, FNP  losartan (COZAAR) 100 MG tablet Take 100 mg by mouth every evening.    Yes [provider]  torsemide (DEMADEX) 20 MG tablet Take 40 mg by mouth daily.    Yes [provider]    Allergies Patient has no known allergies.  Family History  Problem Relation Age of Onset  . Hypertension Mother   . Multiple sclerosis Mother   . Cancer Maternal Grandmother        Ovarian   . Cancer Maternal Grandfather        Prostate     Social History Social History   Tobacco Use  .  Smoking status: Never Smoker  . Smokeless tobacco: Never Used  Substance Use Topics  . Alcohol use: No    Alcohol/week: 0.0 standard drinks  . Drug use: No      Review of Systems Constitutional: + fever Eyes: No visual changes. ENT: No sore throat. Cardiovascular: Denies chest pain. Respiratory: Denies shortness of breath. Gastrointestinal: No abdominal pain.  No nausea, no vomiting.  No diarrhea.  No constipation. Genitourinary: Negative for dysuria. Musculoskeletal: Negative for back pain. + leg swelling + leg pain + redness Skin: Negative for rash. Neurological: Negative for headaches, focal weakness or numbness. All other ROS negative ____________________________________________   PHYSICAL EXAM:  VITAL  SIGNS: ED Triage Vitals  Enc Vitals Group     BP 07/02/19 1939 (!) 213/130     Pulse Rate 07/02/19 1939 (!) 131     Resp 07/02/19 1939 20     Temp 07/02/19 1939 99.5 F (37.5 C)     Temp Source 07/02/19 1939 Oral     SpO2 07/02/19 1939 100 %     Weight 07/02/19 1940 285 lb (129.3 kg)     Height 07/02/19 1940 5\' 6"  (1.676 m)     Head Circumference --      Peak Flow --      Pain Score 07/02/19 1940 10     Pain Loc --      Pain Edu? --      Excl. in [place]? --     Constitutional: Alert and oriented overweight female  Eyes: Conjunctivae are normal. EOMI. Head: Atraumatic. Nose: No congestion/rhinnorhea. Mouth/Throat: Mucous membranes are moist.   Neck: No stridor. Trachea Midline. FROM Cardiovascular: tachycardiac, regular rhythm. Grossly normal heart sounds.  Good peripheral circulation. R chest wall dialysis catheter Respiratory: Normal respiratory effort.  No retractions. Lungs CTAB. Gastrointestinal: Soft and nontender. No distention. No abdominal bruits.  Musculoskeletal: 2+ edema bilateral.  Scabs on the back of the R leg some warmth no obvious redness. Good distal pulse. No crepitus.  Neurologic:  Normal speech and language. No gross focal neurologic deficits are appreciated.  Skin:  Skin is warm, dry and intact. No rash noted. Psychiatric: Mood and affect are normal. Speech and behavior are normal. GU: Deferred   ____________________________________________   LABS (all labs ordered are listed, but only abnormal results are displayed)  Labs Reviewed  CBC - Abnormal; Notable for the following components:      Result Value   WBC 24.1 (*)    RBC 3.19 (*)    Hemoglobin 8.8 (*)    HCT 28.3 (*)    RDW 18.1 (*)    All other components within normal limits  BASIC METABOLIC PANEL - Abnormal; Notable for the following components:   CO2 21 (*)    Glucose, Bld 104 (*)    BUN 69 (*)    Creatinine, Ser 12.98 (*)    Calcium 8.3 (*)    GFR calc non Af Amer 3 (*)    GFR calc Af  Amer 4 (*)    Anion gap 18 (*)    All other components within normal limits  CULTURE, BLOOD (ROUTINE X 2)  CULTURE, BLOOD (ROUTINE X 2)  URINE CULTURE  SARS CORONAVIRUS 2 (TAT 6-24 HRS)  LACTIC ACID, PLASMA  PROTIME-INR  LACTIC ACID, PLASMA  URINALYSIS, ROUTINE W REFLEX MICROSCOPIC  HEMOGLOBIN A1C  HIV ANTIBODY (ROUTINE TESTING W REFLEX)  BASIC METABOLIC PANEL  CBC  MAGNESIUM  POC URINE PREG, ED   ____________________________________________  ED ECG REPORT I, Vanessa Newville, the attending physician, personally viewed and interpreted this ECG.  EKG sinus tachycardia rate of 127, no st elevation, no twi.  ____________________________________________  RADIOLOGY Robert Bellow, personally viewed and evaluated these images (plain radiographs) as part of my medical decision making, as well as reviewing the written report by the radiologist.  ED MD interpretation:  Mild vascular congestion, no pna.   Official radiology report(s): Dg Tibia/fibula Right  Result Date: 07/02/2019 CLINICAL DATA:  Right lower leg swelling and bruising. No known injury. EXAM: RIGHT TIBIA AND FIBULA - 2 VIEW COMPARISON:  None. FINDINGS: There is no evidence of fracture or other focal bone lesions. Soft tissues are unremarkable. IMPRESSION: Negative exam. Electronically Signed   By: Inge Rise M.D.   On: 07/02/2019 20:44   Dg Chest Portable 1 View  Result Date: 07/02/2019 CLINICAL DATA:  Peripheral edema EXAM: PORTABLE CHEST 1 VIEW COMPARISON:  07/05/2017 FINDINGS: Cardiac shadow is mildly prominent. Dialysis catheter is again seen. Vascular congestion is noted likely related to volume overload. No focal infiltrate or effusion is seen. IMPRESSION: Vascular congestion likely related to volume overload. Electronically Signed   By: Inez Catalina M.D.   On: 07/02/2019 20:50    ____________________________________________   PROCEDURES  Procedure(s) performed (including Critical Care):  .Critical Care  Performed by: Vanessa Grayson, MD Authorized by: Vanessa , MD   Critical care provider statement:    Critical care time (minutes):  45   Critical care was necessary to treat or prevent imminent or life-threatening deterioration of the following conditions:  Sepsis   Critical care was time spent personally by me on the following activities:  Discussions with consultants, evaluation of patient's response to treatment, examination of patient, ordering and performing treatments and interventions, ordering and review of laboratory studies, ordering and review of radiographic studies, pulse oximetry, re-evaluation of patient's condition, obtaining history from patient or surrogate and review of old charts     ____________________________________________   INITIAL IMPRESSION / [place] / ED COURSE  [name] was evaluated in Emergency Department on 07/02/2019 for the symptoms described in the history of present illness. She was evaluated in the context of the global COVID-19 pandemic, which necessitated consideration that the patient might be at risk for infection with the SARS-CoV-2 virus that causes COVID-19. Institutional protocols and algorithms that pertain to the evaluation of patients at risk for COVID-19 are in a state of rapid change based on information released by regulatory bodies including the CDC and federal and state organizations. These policies and algorithms were followed during the patient's care in the ED.    Pt with sepsis most likely from cellulitis R leg vs bacteremia and does have R chest wall dialysis catheter but no redness around it.  Given ESRD will cover broadly.  No crepitus on exam to suggest nec fasc and this has been going on a couple of days.  Xray to evaluate.  No abd pain to suggest abd infection. No urine symptoms to suggest UTI.  Will hold off on full fluid resuc given missed dialysis and concern for some pulm edema. Will first give Abs and see  how she handles that fluid and consider IVF if better and give tylenol for fever.  Chest xray no PNA but does have early signs of pulmonary edema.   K is normal does not need emergent dialysis.  WBC elevated 20K.  Sepsis alert called. Starts broad antibiotics. Possible R  leg cellulitis. Xray without crepitus.  No overtly red but does feel warming on the R and does have some scabs on that side and recent given doxy.   D/w hospital team and will admit pt.    ____________________________________________   FINAL CLINICAL IMPRESSION(S) / ED DIAGNOSES   Final diagnoses:  Sepsis, due to unspecified organism, unspecified whether acute organ dysfunction present ([place])  Cellulitis of right lower extremity      MEDICATIONS GIVEN DURING THIS VISIT:  Medications  vancomycin (VANCOCIN) 2,500 mg in sodium chloride 0.9 % 500 mL IVPB (2,500 mg Intravenous New Bag/Given 07/02/19 2240)  diphenhydrAMINE (BENADRYL) 50 MG/ML injection (has no administration in time range)  aspirin EC tablet 81 mg (has no administration in time range)  amLODipine (NORVASC) tablet 10 mg (has no administration in time range)  carvedilol (COREG) tablet 25 mg (has no administration in time range)  cloNIDine (CATAPRES) tablet 0.1 mg (has no administration in time range)  isosorbide mononitrate (IMDUR) 24 hr tablet 30 mg (has no administration in time range)  torsemide (DEMADEX) tablet 40 mg (has no administration in time range)  hydrOXYzine (ATARAX/VISTARIL) tablet 25 mg (has no administration in time range)  calcium acetate (PHOSLO) capsule 1,334 mg (has no administration in time range)  insulin aspart (novoLOG) injection 0-9 Units (has no administration in time range)  insulin aspart (novoLOG) injection 0-5 Units (has no administration in time range)  heparin injection 5,000 Units (has no administration in time range)  metroNIDAZOLE (FLAGYL) IVPB 500 mg (has no administration in time range)  HYDROmorphone (DILAUDID)  injection 1 mg (has no administration in time range)  ceFEPIme (MAXIPIME) 2 g in sodium chloride 0.9 % 100 mL IVPB (0 g Intravenous Stopped 07/02/19 2134)  metroNIDAZOLE (FLAGYL) IVPB 500 mg (500 mg Intravenous New Bag/Given 07/02/19 2136)  acetaminophen (TYLENOL) tablet 1,000 mg (1,000 mg Oral Given 07/02/19 2136)  oxyCODONE-acetaminophen (PERCOCET/ROXICET) 5-325 MG per tablet 1 tablet (1 tablet Oral Given 07/02/19 2136)  diphenhydrAMINE (BENADRYL) injection 25 mg (25 mg Intravenous Given 07/02/19 2142)  HYDROmorphone (DILAUDID) injection 1 mg (1 mg Intravenous Given 07/02/19 2238)     ED Discharge Orders    None       Note:  This document was prepared using Dragon voice recognition software and may include unintentional dictation errors.   Vanessa [place], MD 07/02/19 2256

## 2019-07-02 NOTE — ED Notes (Signed)
Report given to Melissa, RN

## 2019-07-02 NOTE — Progress Notes (Signed)
CODE SEPSIS - PHARMACY COMMUNICATION  **Broad Spectrum Antibiotics should be administered within 1 hour of Sepsis diagnosis**  Time Code Sepsis Called/Page Received:  9/15 @ 2019   Antibiotics Ordered: Cefepime , metronidazole   Time of 1st antibiotic administration: 9/15 @ 2035   Additional action taken by pharmacy:   If necessary, Name of Provider/Nurse Contacted:     Cheyrl Buley D ,PharmD Clinical Pharmacist  07/02/2019  9:37 PM

## 2019-07-02 NOTE — ED Notes (Signed)
First nurse made aware of patients vitals.

## 2019-07-02 NOTE — ED Notes (Signed)
Pt reports pain 7/10 after receiving dilaudid. Pt no longer writhing in pain and is laughing with sister at this time

## 2019-07-02 NOTE — ED Notes (Signed)
Verbal given by Dr. Stark Klein for dilaudid 1mg  IV

## 2019-07-02 NOTE — Consult Note (Signed)
PHARMACY -  BRIEF ANTIBIOTIC NOTE   Pharmacy has received consult(s) for Cefepime/Vancomycin from an ED provider.  The patient's profile has been reviewed for ht/wt/allergies/indication/available labs.    One time order(s) placed for Cefepime 2g x1 dose and Vancomycin 2500 mg X1 dose.   Further antibiotics/pharmacy consults should be ordered by admitting physician if indicated.                       Thank you, Rowland Lathe 07/02/2019  8:23 PM

## 2019-07-02 NOTE — ED Notes (Signed)
US at bedside

## 2019-07-03 LAB — CBC
HCT: 24.7 % — ABNORMAL LOW (ref 36.0–46.0)
Hemoglobin: 7.5 g/dL — ABNORMAL LOW (ref 12.0–15.0)
MCH: 27.4 pg (ref 26.0–34.0)
MCHC: 30.4 g/dL (ref 30.0–36.0)
MCV: 90.1 fL (ref 80.0–100.0)
Platelets: 296 10*3/uL (ref 150–400)
RBC: 2.74 MIL/uL — ABNORMAL LOW (ref 3.87–5.11)
RDW: 18.2 % — ABNORMAL HIGH (ref 11.5–15.5)
WBC: 21.4 10*3/uL — ABNORMAL HIGH (ref 4.0–10.5)
nRBC: 0 % (ref 0.0–0.2)

## 2019-07-03 LAB — GLUCOSE, CAPILLARY
Glucose-Capillary: 105 mg/dL — ABNORMAL HIGH (ref 70–99)
Glucose-Capillary: 62 mg/dL — ABNORMAL LOW (ref 70–99)
Glucose-Capillary: 63 mg/dL — ABNORMAL LOW (ref 70–99)
Glucose-Capillary: 77 mg/dL (ref 70–99)
Glucose-Capillary: 90 mg/dL (ref 70–99)
Glucose-Capillary: 99 mg/dL (ref 70–99)

## 2019-07-03 LAB — MAGNESIUM: Magnesium: 1.7 mg/dL (ref 1.7–2.4)

## 2019-07-03 LAB — BASIC METABOLIC PANEL
Anion gap: 15 (ref 5–15)
BUN: 73 mg/dL — ABNORMAL HIGH (ref 6–20)
CO2: 20 mmol/L — ABNORMAL LOW (ref 22–32)
Calcium: 7.7 mg/dL — ABNORMAL LOW (ref 8.9–10.3)
Chloride: 103 mmol/L (ref 98–111)
Creatinine, Ser: 13.43 mg/dL — ABNORMAL HIGH (ref 0.44–1.00)
GFR calc Af Amer: 4 mL/min — ABNORMAL LOW (ref >60)
GFR calc non Af Amer: 3 mL/min — ABNORMAL LOW (ref >60)
Glucose, Bld: 96 mg/dL (ref 70–99)
Potassium: 4.5 mmol/L (ref 3.5–5.1)
Sodium: 138 mmol/L (ref 135–145)

## 2019-07-03 LAB — SARS CORONAVIRUS 2 (TAT 6-24 HRS): SARS Coronavirus 2: NEGATIVE

## 2019-07-03 MED ORDER — OXYCODONE-ACETAMINOPHEN 7.5-325 MG PO TABS
1.0000 | ORAL_TABLET | Freq: Four times a day (QID) | ORAL | Status: DC | PRN
  Administered 2019-07-03 – 2019-07-07 (×6): 1 via ORAL
  Filled 2019-07-03 (×6): qty 1

## 2019-07-03 MED ORDER — VANCOMYCIN HCL IN DEXTROSE 1-5 GM/200ML-% IV SOLN
1000.0000 mg | INTRAVENOUS | Status: DC
  Administered 2019-07-04 – 2019-07-06 (×2): 1000 mg via INTRAVENOUS
  Filled 2019-07-03 (×4): qty 200

## 2019-07-03 MED ORDER — FENTANYL CITRATE (PF) 100 MCG/2ML IJ SOLN
50.0000 ug | Freq: Once | INTRAMUSCULAR | Status: AC
  Administered 2019-07-03: 01:00:00 50 ug via INTRAVENOUS
  Filled 2019-07-03: qty 2

## 2019-07-03 MED ORDER — EPOETIN ALFA 10000 UNIT/ML IJ SOLN
10000.0000 [IU] | INTRAMUSCULAR | Status: DC
  Administered 2019-07-04 – 2019-07-09 (×3): 10000 [IU] via INTRAVENOUS
  Filled 2019-07-03: qty 1

## 2019-07-03 MED ORDER — SODIUM CHLORIDE 0.9 % IV SOLN
2.0000 g | INTRAVENOUS | Status: DC
  Administered 2019-07-04 – 2019-07-09 (×4): 2 g via INTRAVENOUS
  Filled 2019-07-03 (×3): qty 2

## 2019-07-03 MED ORDER — CHLORHEXIDINE GLUCONATE CLOTH 2 % EX PADS
6.0000 | MEDICATED_PAD | Freq: Every day | CUTANEOUS | Status: DC
  Administered 2019-07-03 – 2019-07-09 (×6): 6 via TOPICAL

## 2019-07-03 MED ORDER — DIPHENHYDRAMINE HCL 25 MG PO CAPS
25.0000 mg | ORAL_CAPSULE | Freq: Three times a day (TID) | ORAL | Status: DC | PRN
  Administered 2019-07-04 – 2019-07-07 (×3): 25 mg via ORAL
  Filled 2019-07-03 (×3): qty 1

## 2019-07-03 MED ORDER — DIPHENHYDRAMINE HCL 25 MG PO CAPS
25.0000 mg | ORAL_CAPSULE | Freq: Four times a day (QID) | ORAL | Status: AC | PRN
  Administered 2019-07-03: 25 mg via ORAL
  Filled 2019-07-03: qty 1

## 2019-07-03 MED ORDER — SODIUM CHLORIDE 0.9 % IV SOLN
INTRAVENOUS | Status: DC | PRN
  Administered 2019-07-03 (×2): 30 mL via INTRAVENOUS
  Administered 2019-07-03: 250 mL via INTRAVENOUS
  Administered 2019-07-04 (×3): 30 mL via INTRAVENOUS
  Administered 2019-07-04 – 2019-07-09 (×3): 250 mL via INTRAVENOUS

## 2019-07-03 MED ORDER — SODIUM THIOSULFATE 25 % IV SOLN
25.0000 g | Freq: Once | INTRAVENOUS | Status: AC
  Administered 2019-07-03: 25 g via INTRAVENOUS
  Filled 2019-07-03: qty 100

## 2019-07-03 NOTE — Progress Notes (Signed)
Post HD Note:    07/03/19 1927  Vital Signs  Temp 99.1 F (37.3 C)  Temp Source Oral  Pulse Rate (!) 110  Resp 19  BP (!) 138/96  BP Location Right Arm  BP Method Automatic  Patient Position (if appropriate) Lying  Oxygen Therapy  SpO2 98 %  O2 Device Nasal Cannula  O2 Flow Rate (L/min) 3 L/min  Pain Assessment  Pain Scale 0-10  Pain Score 9  Post-Hemodialysis Assessment  Rinseback Volume (mL) 0 mL  KECN 42.7 V  Dialyzer Clearance Lightly streaked  Duration of HD Treatment -hour(s) 3.5 hour(s)  Hemodialysis Intake (mL) 250 mL  UF Total -Machine (mL) 2399 mL  Net UF (mL) 2149 mL  Tolerated HD Treatment Yes  AVG/AVF Arterial Site Held (minutes)  (n/a )  AVG/AVF Venous Site Held (minutes)  (n/a)  Education / Care Plan  Dialysis Education Provided Yes  Documented Education in Care Plan Yes  Outpatient Plan of Care Reviewed and on Chart Yes  Hemodialysis Catheter Right Internal jugular Double-lumen  Placement Date: 02/28/19   Time Out: Correct patient;Correct site;Correct procedure  Maximum sterile barrier precautions: Hand hygiene;Cap;Mask;Large sterile sheet;Sterile gloves;Sterile gown  Site Prep: Chlorhexidine  Local Anesthetic: Injectable - 1...  Site Condition No complications  Blue Lumen Status Heparin locked  Red Lumen Status Heparin locked  Purple Lumen Status N/A  Catheter fill solution Heparin 1000 units/ml  Catheter fill volume (Arterial) 1.7 cc  Catheter fill volume (Venous) 1.7  Dressing Type Biopatch;Occlusive  Dressing Status Clean;Dry;Intact  Interventions New dressing  Drainage Description None  Dressing Change Due 07/09/19  Post treatment catheter status Capped and Clamped

## 2019-07-03 NOTE — Progress Notes (Signed)
Pre HD Assessment:     07/03/19 1615  Neurological  Level of Consciousness Alert  Orientation Level Oriented X4  Respiratory  Respiratory Pattern Regular;Unlabored  Cardiac  Pulse Regular  Heart Sounds S1, S2  Vascular  R Radial Pulse +2  L Radial Pulse +2  Psychosocial  Psychosocial (WDL) WDL

## 2019-07-03 NOTE — Progress Notes (Signed)
Post HD Assessment:    07/03/19 1927  Neurological  Level of Consciousness Alert  Orientation Level Oriented X4  Respiratory  Respiratory Pattern Regular;Unlabored  Cardiac  Pulse Regular  Heart Sounds S1, S2  Vascular  R Radial Pulse +2  L Radial Pulse +2  Psychosocial  Psychosocial (WDL) WDL

## 2019-07-03 NOTE — Progress Notes (Signed)
Pharmacy Antibiotic Note  [name] is a 33 y.o. female admitted on 07/02/2019 with sepsis.  Pharmacy has been consulted for Vancomycin and Cefepime dosing.  past medical history of ESRD on HD Tuesdays, Thursdays and Saturdays,   Plan: Pt received Vancomycin 2500mg  and Cefepime 2gm on admission  Will monitor plans for HD and dose appropriately.    Height: 5\' 6"  (167.6 cm) Weight: 285 lb (129.3 kg) IBW/kg (Calculated) : 59.3  Temp (24hrs), Avg:99.1 F (37.3 C), Min:98.7 F (37.1 C), Max:99.5 F (37.5 C)  Recent Labs  Lab 07/02/19 1947 07/02/19 2008 07/02/19 2224  WBC 24.1*  --   --   CREATININE 12.98*  --   --   LATICACIDVEN  --  1.2 1.1    Estimated Creatinine Clearance: 8.4 mL/min (A) (by C-G formula based on SCr of 12.98 mg/dL (H)).    No Known Allergies  Antimicrobials this admission: Cefepime 9/15 >>  Vancomycin 9/15 >>  Flagyl 500mg  x 1 on 9/15  Dose adjustments this admission:  Microbiology results:  BCx:   UCx:    Sputum:    MRSA PCR:   Thank you for allowing pharmacy to be a part of this patient's care.  Hart Robinsons A 07/03/2019 2:35 AM

## 2019-07-03 NOTE — Progress Notes (Signed)
[place], [place] 07/03/19  Subjective:   LOS: 1 09/15 0701 - 09/16 0700 In: 274.1 [IV Piggyback:274.1] Out: -  Patient presented to ER for worsening le edema and painful ulcers Seen during HD  BFR 260 cc/min    Objective:  Vital signs in last 24 hours:  Temp:  [98.1 F (36.7 C)-99.5 F (37.5 C)] 98.5 F (36.9 C) (09/16 1158) Pulse Rate:  [100-138] 103 (09/16 1158) Resp:  [18-27] 18 (09/16 1158) BP: (140-213)/(97-130) 140/100 (09/16 1158) SpO2:  [92 %-100 %] 98 % (09/16 1158) Weight:  [129.3 kg] 129.3 kg (09/15 1940)  Weight change:  Filed Weights   07/02/19 1940  Weight: 129.3 kg    Intake/Output:    Intake/Output Summary (Last 24 hours) at 07/03/2019 1705 Last data filed at 07/03/2019 1600 Gross per 24 hour  Intake 782.47 ml  Output -  Net 782.47 ml    Gen:   Alert, cooperative, no distress, appears stated age Head:   Normocephalic, without obvious abnormality, atraumatic Eyes/ENT:  conjunctiva/corneas clear,  moist oral mucus membranes Neck:  Supple,  thyroid: not enlarged, no JVD Lungs:   Clear to auscultation bilaterally, respirations unlabored, O2 by [place] Heart:   Regular rate and rhythm, S1, S2 normal, no murmur, rub or gallop Abdomen:   Soft, non-tender, bowel sounds active  Extremities: no cyanosis; 3+  Edema;  Skin:  Skin color, texture, turgor normal, ulcerative lesions at back of calf Neurologic: Alert and oriented, able to answer questions appropriately       Basic Metabolic Panel:  Recent Labs  Lab 07/02/19 1947 07/03/19 0417  NA 139 138  K 4.6 4.5  CL 100 103  CO2 21* 20*  GLUCOSE 104* 96  BUN 69* 73*  CREATININE 12.98* 13.43*  CALCIUM 8.3* 7.7*  MG  --  1.7     CBC: Recent Labs  Lab 07/02/19 1947 07/03/19 0417  WBC 24.1* 21.4*  HGB 8.8* 7.5*  HCT 28.3* 24.7*  MCV 88.7 90.1  PLT 327 296      Lab Results  Component Value Date   HEPBSAG Negative 02/11/2016   HEPBSAB Non Reactive  02/11/2016      Microbiology:  Recent Results (from the past 240 hour(s))  Culture, blood (Routine x 2)     Status: None (Preliminary result)   Collection Time: 07/02/19  8:08 PM   Specimen: BLOOD  Result Value Ref Range Status   Specimen Description BLOOD LEFT ANTECUBITAL  Final   Special Requests   Final    BOTTLES DRAWN AEROBIC AND ANAEROBIC Blood Culture results may not be optimal due to an excessive volume of blood received in culture bottles   Culture   Final    NO GROWTH < 12 HOURS Performed at [facility], [address] [place], [place] [address]    Report Status PENDING  Incomplete  Culture, blood (Routine x 2)     Status: None (Preliminary result)   Collection Time: 07/02/19  8:08 PM   Specimen: BLOOD  Result Value Ref Range Status   Specimen Description BLOOD BLOOD LEFT HAND  Final   Special Requests   Final    BOTTLES DRAWN AEROBIC AND ANAEROBIC Blood Culture adequate volume   Culture   Final    NO GROWTH < 12 HOURS Performed at [facility], [place], [place] [address]    Report Status PENDING  Incomplete  SARS CORONAVIRUS 2 (TAT 6-24 HRS) Nasopharyngeal Nasopharyngeal Swab  Status: None   Collection Time: 07/02/19 10:48 PM   Specimen: Nasopharyngeal Swab  Result Value Ref Range Status   SARS Coronavirus 2 NEGATIVE NEGATIVE Final    Comment: (NOTE) SARS-CoV-2 target nucleic acids are NOT DETECTED. The SARS-CoV-2 RNA is generally detectable in upper and lower respiratory specimens during the acute phase of infection. Negative results do not preclude SARS-CoV-2 infection, do not rule out co-infections with other pathogens, and should not be used as the sole basis for treatment or other patient management decisions. Negative results must be combined with clinical observations, patient history, and epidemiological information. The expected result is Negative. Fact Sheet for Patients:  SugarRoll.be Fact Sheet for Healthcare Providers: https://www.woods-mathews.com/ This test is not yet approved or cleared by the [place] FDA and  has been authorized for detection and/or diagnosis of SARS-CoV-2 by FDA under an Emergency Use Authorization (EUA). This EUA will remain  in effect (meaning this test can be used) for the duration of the COVID-19 declaration under Section 56 4(b)(1) of the Act, 21 U.S.C. section 360bbb-3(b)(1), unless the authorization is terminated or revoked sooner. Performed at [facility], [address] [place], [place] [address]     Coagulation Studies: Recent Labs    07/02/19 01/15/07  LABPROT 14.4  INR 1.1    Urinalysis: No results for input(s): COLORURINE, LABSPEC, PHURINE, GLUCOSEU, HGBUR, BILIRUBINUR, KETONESUR, PROTEINUR, UROBILINOGEN, NITRITE, LEUKOCYTESUR in the last 72 hours.  Invalid input(s): APPERANCEUR    Imaging: Dg Tibia/fibula Right  Result Date: 07/02/2019 CLINICAL DATA:  Right lower leg swelling and bruising. No known injury. EXAM: RIGHT TIBIA AND FIBULA - 2 VIEW COMPARISON:  None. FINDINGS: There is no evidence of fracture or other focal bone lesions. Soft tissues are unremarkable. IMPRESSION: Negative exam. Electronically Signed   By: Inge Rise M.D.   On: 07/02/2019 20:44   US Venous Img Lower Bilateral  Result Date: 07/03/2019 CLINICAL DATA:  Bilateral leg swelling for 1 month EXAM: BILATERAL LOWER EXTREMITY VENOUS DOPPLER ULTRASOUND TECHNIQUE: Gray-scale sonography with graded compression, as well as color Doppler and duplex ultrasound were performed to evaluate the lower extremity deep venous systems from the level of the common femoral vein and including the common femoral, femoral, profunda femoral, popliteal and calf veins including the posterior tibial, peroneal and gastrocnemius veins when visible. The superficial great saphenous vein was also interrogated.  Spectral Doppler was utilized to evaluate flow at rest and with distal augmentation maneuvers in the common femoral, femoral and popliteal veins. COMPARISON:  None. FINDINGS: RIGHT LOWER EXTREMITY Common Femoral Vein: No evidence of thrombus. Normal compressibility, respiratory phasicity and response to augmentation. Saphenofemoral Junction: No evidence of thrombus. Normal compressibility and flow on color Doppler imaging. Profunda Femoral Vein: No evidence of thrombus. Normal compressibility and flow on color Doppler imaging. Femoral Vein: No evidence of thrombus. Normal compressibility, respiratory phasicity and response to augmentation. Popliteal Vein: No evidence of thrombus. Normal compressibility, respiratory phasicity and response to augmentation. Calf Veins: Calf veins are difficult to visualize due to underlying edema. Superficial Great Saphenous Vein: No evidence of thrombus. Normal compressibility. Venous Reflux:  None. Other Findings:  None. LEFT LOWER EXTREMITY Common Femoral Vein: No evidence of thrombus. Normal compressibility, respiratory phasicity and response to augmentation. Saphenofemoral Junction: No evidence of thrombus. Normal compressibility and flow on color Doppler imaging. Profunda Femoral Vein: No evidence of thrombus. Normal compressibility and flow on color Doppler imaging. Femoral Vein: No evidence of thrombus. Normal compressibility, respiratory phasicity and response to augmentation. Popliteal  Vein: No evidence of thrombus. Normal compressibility, respiratory phasicity and response to augmentation. Calf Veins: Calf veins are difficult to visualize due to underlying edema Superficial Great Saphenous Vein: No evidence of thrombus. Normal compressibility. Venous Reflux:  None. Other Findings:  None. IMPRESSION: No central deep venous thrombosis is noted. The calf veins are difficult to visualize. Electronically Signed   By: Inez Catalina M.D.   On: 07/03/2019 00:04   Dg Chest Portable  1 View  Result Date: 07/02/2019 CLINICAL DATA:  Peripheral edema EXAM: PORTABLE CHEST 1 VIEW COMPARISON:  07/05/2017 FINDINGS: Cardiac shadow is mildly prominent. Dialysis catheter is again seen. Vascular congestion is noted likely related to volume overload. No focal infiltrate or effusion is seen. IMPRESSION: Vascular congestion likely related to volume overload. Electronically Signed   By: Inez Catalina M.D.   On: 07/02/2019 20:50     Medications:   . sodium chloride Stopped (07/03/19 1549)  . [START ON 07/04/2019] ceFEPime (MAXIPIME) IV    . metronidazole Stopped (07/03/19 1508)  . [START ON 07/04/2019] vancomycin     . amLODipine  10 mg Oral Daily  . aspirin EC  81 mg Oral Daily  . calcium acetate  1,334 mg Oral TID WC  . carvedilol  25 mg Oral BID WC  . Chlorhexidine Gluconate Cloth  6 each Topical Q0600  . cloNIDine  0.1 mg Oral TID  . heparin  5,000 Units Subcutaneous Q8H  . insulin aspart  0-5 Units Subcutaneous QHS  . insulin aspart  0-9 Units Subcutaneous TID WC  . isosorbide mononitrate  30 mg Oral BID  . torsemide  40 mg Oral Daily   sodium chloride, diphenhydrAMINE, HYDROmorphone (DILAUDID) injection, hydrOXYzine, labetalol, oxyCODONE-acetaminophen  Assessment/ Plan:  33 y.o.[address] female with ESTD, HTN, DM, CHF, Anemia, Asthma, Chronic edema  Active Problems:   Sepsis due to cellulitis [facility])  [facility]/ [employer] TTS  #. ESRD Recent Labs    07/02/19 1947 07/03/19 0417  CREATININE 12.98* 13.43*  HD today for volume removal Extra treatment tomorrow - UF only  #. Anemia of CKD  Lab Results  Component Value Date   HGB 7.5 (L) 07/03/2019  EPO with HD  #. SHPTH     Component Value Date/Time   PTH 217 (H) 02/11/2016 1317   No results found for: PHOS Monitor phos  #. HTN   BP control has improved with volume removal  #. Diabetes type 2 with CKD Hgb A1c MFr Bld (%)  Date Value  02/11/2016 5.9   # Leg ulcers with chronic edema  Differential includes venous ulceration vs calciphylaxis Will consider sodium thiosulfate    LOS: [place] 9/16/20205:05 PM  [place], [place]

## 2019-07-03 NOTE — H&P (Addendum)
[place] [facility] [place] NAME: [name]    MR#:  [id number]  DATE OF BIRTH:  1986/07/04  DATE OF ADMISSION:  07/02/2019  PRIMARY CARE PHYSICIAN: [facility]   REQUESTING/REFERRING PHYSICIAN: Janeice Robinson, MD  CHIEF COMPLAINT:   Chief Complaint  Patient presents with  . Leg Swelling  . Bleeding/Bruising    HISTORY OF PRESENT ILLNESS:   33 year old female with past medical history of ESR D on HD Tuesdays, Thursdays and Saturdays, hypertension, diabetes mellitus, CHF, anemia, and asthma presenting to the ED with chief complaints of bilateral leg swelling and pain.  Ports onset of leg swelling for the past few months with worsening symptoms in the last month. She was recently treated with doxycycline for left lower leg redness with no improvement.  She describes symptoms of warmth in her leg, constant aching and burning worse with ambulation.  Patient states she is developed raw areas in her bilateral legs and bruising which is caused abrasion and ulcers to the back of her right calf.  Patient states she was unable to go to dialysis today due to not feeling well from constant pain associated with fever last night.  Denies shortness of breath, chest pain, palpitations, nausea or vomiting, abdominal pain or any recent sick contacts.  On arrival to the ED, she was afebrile with blood pressure 213/130 mm Hg and pulse rate 131 beats/min. There were no focal neurological deficits; she was alert and oriented x4.  Labs revealed WBC 24.1, hemoglobin 8.8, glucose 104, BUN 69, creatinine 12.9, lactic acid 1.2.  X-ray of the right leg shows no evidence of fracture or lesion.  Ultrasound venous bilateral shows no evidence of DVT.  Patient was started on empiric antibiotics for cellulitis in the ED and will be admitted under hospitalist service for further management.  PAST MEDICAL HISTORY:   Past Medical History:  Diagnosis Date  .  Anemia of chronic disease   . Asthma   . CHF (congestive heart failure) ([place])   . CKD (chronic kidney disease), stage III ([place])   . Diabetes ([place])    type 2  . Hypertension   . Morbid obesity ([place])     PAST SURGICAL HISTORY:   Past Surgical History:  Procedure Laterality Date  . DIALYSIS/PERMA CATHETER INSERTION N/A 02/28/2019   Procedure: DIALYSIS/PERMA CATHETER INSERTION;  Surgeon: Algernon Huxley, MD;  Location: [facility];  Service: Cardiovascular;  Laterality: N/A;  . TONSILLECTOMY      SOCIAL HISTORY:   Social History   Tobacco Use  . Smoking status: Never Smoker  . Smokeless tobacco: Never Used  Substance Use Topics  . Alcohol use: No    Alcohol/week: 0.0 standard drinks    FAMILY HISTORY:   Family History  Problem Relation Age of Onset  . Hypertension Mother   . Multiple sclerosis Mother   . Cancer Maternal Grandmother        Ovarian   . Cancer Maternal Grandfather        Prostate     DRUG ALLERGIES:  No Known Allergies  REVIEW OF SYSTEMS:   Review of Systems  Constitutional: Positive for chills. Negative for fever, malaise/fatigue and weight loss.  HENT: Negative for congestion, hearing loss and sore throat.   Eyes: Negative for blurred vision and double vision.  Respiratory: Negative for cough, shortness of breath and wheezing.   Cardiovascular: Positive for leg swelling. Negative for chest pain, palpitations and orthopnea.  Gastrointestinal: Negative for abdominal pain, diarrhea, nausea and vomiting.  Genitourinary: Negative for dysuria and urgency.  Musculoskeletal: Negative for myalgias.       Leg pain  Skin: Negative for rash.  Neurological: Negative for dizziness, sensory change, speech change, focal weakness and headaches.  Psychiatric/Behavioral: Negative for depression.   MEDICATIONS AT HOME:   Prior to Admission medications   Medication Sig Start Date End Date Taking? Authorizing Provider  amLODipine (NORVASC) 10 MG tablet  Take 10 mg by mouth daily.   Yes [provider]  aspirin EC 81 MG EC tablet Take 1 tablet (81 mg total) by mouth daily. 02/16/16  Yes Demetrios Loll, MD  calcium acetate (PHOSLO) 667 MG capsule Take 1,334 mg by mouth 3 (three) times daily with meals.   Yes [provider]  carvedilol (COREG) 25 MG tablet Take 1 tablet (25 mg total) by mouth 2 (two) times daily with a meal. 02/20/18  Yes Darylene Price A, FNP  cloNIDine (CATAPRES) 0.1 MG tablet Take 0.1 mg by mouth 3 (three) times daily.   Yes [provider]  hydrOXYzine (ATARAX/VISTARIL) 25 MG tablet Take 25 mg by mouth 2 (two) times daily as needed for itching. 05/09/19  Yes [provider]  isosorbide mononitrate (IMDUR) 30 MG 24 hr tablet Take 1 tablet (30 mg total) by mouth 2 (two) times daily. 02/20/18  Yes Hackney, Otila Kluver A, FNP  losartan (COZAAR) 100 MG tablet Take 100 mg by mouth every evening.    Yes [provider]  torsemide (DEMADEX) 20 MG tablet Take 40 mg by mouth daily.    Yes [provider]      VITAL SIGNS:  Blood pressure (!) 190/113, pulse (!) 126, temperature 98.7 F (37.1 C), temperature source Oral, resp. rate 20, height 5' 6"  (1.676 m), weight 129.3 kg, last menstrual period 06/05/2019, SpO2 99 %.  PHYSICAL EXAMINATION:   Physical Exam  GENERAL:  33 y.o.-year-old patient lying in the bed with no acute distress.  EYES: Pupils equal, round, reactive to light and accommodation. No scleral icterus. Extraocular muscles intact.  HEENT: Head atraumatic, normocephalic. Oropharynx and nasopharynx clear.  NECK:  Supple, no jugular venous distention. No thyroid enlargement, no tenderness.  LUNGS: Normal breath sounds bilaterally, no wheezing, rales,rhonchi or crepitation. No use of accessory muscles of respiration.  CARDIOVASCULAR: S1, S2 normal. No murmurs, rubs, or gallops.  ABDOMEN: Soft, nontender, nondistended. Bowel sounds present. No organomegaly or mass.  EXTREMITIES: No pedal  edema, cyanosis, or clubbing. No rash or lesions. + pedal pulses MUSCULOSKELETAL: Normal bulk, and power was 5+ grip and elbow, knee, and ankle flexion and extension bilaterally.  NEUROLOGIC:Alert and oriented x 3. CN 2-12 intact. Gait not tested due to safety concern. PSYCHIATRIC: The patient is alert and oriented x 3.  SKIN: As below    .   DATA REVIEWED:  LABORATORY PANEL:   CBC Recent Labs  Lab 07/02/19 1947  WBC 24.1*  HGB 8.8*  HCT 28.3*  PLT 327   ------------------------------------------------------------------------------------------------------------------  Chemistries  Recent Labs  Lab 07/02/19 1947  NA 139  K 4.6  CL 100  CO2 21*  GLUCOSE 104*  BUN 69*  CREATININE 12.98*  CALCIUM 8.3*   ------------------------------------------------------------------------------------------------------------------  Cardiac Enzymes No results for input(s): TROPONINI in the last 168 hours. ------------------------------------------------------------------------------------------------------------------  RADIOLOGY:  Dg Tibia/fibula Right  Result Date: 07/02/2019 CLINICAL DATA:  Right lower leg swelling and bruising. No known injury. EXAM: RIGHT TIBIA AND FIBULA - 2 VIEW COMPARISON:  None.  FINDINGS: There is no evidence of fracture or other focal bone lesions. Soft tissues are unremarkable. IMPRESSION: Negative exam. Electronically Signed   By: Inge Rise M.D.   On: 07/02/2019 20:44   US Venous Img Lower Bilateral  Result Date: 07/03/2019 CLINICAL DATA:  Bilateral leg swelling for 1 month EXAM: BILATERAL LOWER EXTREMITY VENOUS DOPPLER ULTRASOUND TECHNIQUE: Gray-scale sonography with graded compression, as well as color Doppler and duplex ultrasound were performed to evaluate the lower extremity deep venous systems from the level of the common femoral vein and including the common femoral, femoral, profunda femoral, popliteal and calf veins including the posterior  tibial, peroneal and gastrocnemius veins when visible. The superficial great saphenous vein was also interrogated. Spectral Doppler was utilized to evaluate flow at rest and with distal augmentation maneuvers in the common femoral, femoral and popliteal veins. COMPARISON:  None. FINDINGS: RIGHT LOWER EXTREMITY Common Femoral Vein: No evidence of thrombus. Normal compressibility, respiratory phasicity and response to augmentation. Saphenofemoral Junction: No evidence of thrombus. Normal compressibility and flow on color Doppler imaging. Profunda Femoral Vein: No evidence of thrombus. Normal compressibility and flow on color Doppler imaging. Femoral Vein: No evidence of thrombus. Normal compressibility, respiratory phasicity and response to augmentation. Popliteal Vein: No evidence of thrombus. Normal compressibility, respiratory phasicity and response to augmentation. Calf Veins: Calf veins are difficult to visualize due to underlying edema. Superficial Great Saphenous Vein: No evidence of thrombus. Normal compressibility. Venous Reflux:  None. Other Findings:  None. LEFT LOWER EXTREMITY Common Femoral Vein: No evidence of thrombus. Normal compressibility, respiratory phasicity and response to augmentation. Saphenofemoral Junction: No evidence of thrombus. Normal compressibility and flow on color Doppler imaging. Profunda Femoral Vein: No evidence of thrombus. Normal compressibility and flow on color Doppler imaging. Femoral Vein: No evidence of thrombus. Normal compressibility, respiratory phasicity and response to augmentation. Popliteal Vein: No evidence of thrombus. Normal compressibility, respiratory phasicity and response to augmentation. Calf Veins: Calf veins are difficult to visualize due to underlying edema Superficial Great Saphenous Vein: No evidence of thrombus. Normal compressibility. Venous Reflux:  None. Other Findings:  None. IMPRESSION: No central deep venous thrombosis is noted. The calf veins are  difficult to visualize. Electronically Signed   By: Inez Catalina M.D.   On: 07/03/2019 00:04   Dg Chest Portable 1 View  Result Date: 07/02/2019 CLINICAL DATA:  Peripheral edema EXAM: PORTABLE CHEST 1 VIEW COMPARISON:  07/05/2017 FINDINGS: Cardiac shadow is mildly prominent. Dialysis catheter is again seen. Vascular congestion is noted likely related to volume overload. No focal infiltrate or effusion is seen. IMPRESSION: Vascular congestion likely related to volume overload. Electronically Signed   By: Inez Catalina M.D.   On: 07/02/2019 20:50    EKG:  EKG: unchanged from previous tracings, sinus tachycardia. Vent. rate 127 BPM PR interval * ms QRS duration 94 ms QT/QTc 332/483 ms P-R-T axes 83 23 6  IMPRESSION AND PLAN:   33 y.o. female with past medical history of ESR D on HD Tuesdays, Thursdays and Saturdays, hypertension, diabetes mellitus, CHF, anemia, and asthma presenting to the ED with chief complaints of bilateral leg swelling and pain.  1. Sepsis - Patient meets SIRS criteria, likely secondary to Bilateral leg cellulitis - Admit to medsurg unit - Urine cultures pending - Blood cultures pending - Start Empiric abx with vancomycin, cefepime and Flagyl - PRN IV and p.o. pain management  2.  ESRD on HD Tuesdays, Thursdays and Saturday -BUN/creatinine elevated above baseline likely due to missing dialysis  -Hold  nephrotoxins -Nephrology consult placed.  Message sent via [facility] to Dr. Candiss Norse  3.  Acute on chronic systolic Congestive Heart Failure: Bilateral lower extremity edema  Last Echo 01/2016 with  EF 50-55% - Chest x-ray shows vascular congestion likely due to volume overload - Continue Demadex - Continue Imdur and Coreg - Pending HD in a.m. - Low salt diet  - Check daily weight - Strict I&Os  4. HTN- stable + Goal BP <130/80 -Continue clonidine, amlodipine,coreg -PRN antihypertensives  5. Diabetes Mellitus Type 2 with complications - Check [id number] - CBG  monitoring - SSI - DM education  6. Anemia of CKD:  Hgb at 8.8.  Receiving Epogen with each treatment.    7. DVT prophylaxis - Heparin    All the records are reviewed and case discussed with ED provider. Management plans discussed with the patient, family and they are in agreement.  CODE STATUS: FULL  TOTAL TIME TAKING CARE OF THIS PATIENT: 50 minutes.    on 07/03/2019 at 12:46 AM  Rufina Falco, DNP, FNP-BC [facility] Nurse Practitioner Between 7am to 6pm - Pager [phone]  After 6pm go to [email or web address] - password [email or web address] [facility]  Office  [phone]  CC: Primary care physician; [place]

## 2019-07-03 NOTE — Progress Notes (Signed)
[place] at [facility]                                                                                                                                                                                  Patient Demographics   [name], is a 33 y.o. female, DOB - [birth year], [place] date - 07/02/2019   Admitting Physician Lang Snow, NP  Outpatient Primary MD for the patient is [place]   LOS - 1  Subjective: Patient admitted with bilateral lower extremity cellulitis    Review of Systems:   CONSTITUTIONAL: No documented fever. No fatigue, weakness. No weight gain, no weight loss.  EYES: No blurry or double vision.  ENT: No tinnitus. No postnasal drip. No redness of the oropharynx.  RESPIRATORY: No cough, no wheeze, no hemoptysis. No dyspnea.  CARDIOVASCULAR: No chest pain. No orthopnea. No palpitations. No syncope.  GASTROINTESTINAL: No nausea, no vomiting or diarrhea. No abdominal pain. No melena or hematochezia.  GENITOURINARY: No dysuria or hematuria.  ENDOCRINE: No polyuria or nocturia. No heat or cold intolerance.  HEMATOLOGY: No anemia. No bruising. No bleeding.  INTEGUMENTARY: Bilateral lower extremity swelling  MUSCULOSKELETAL: No arthritis. No swelling. No gout.  NEUROLOGIC: No numbness, tingling, or ataxia. No seizure-type activity.  PSYCHIATRIC: No anxiety. No insomnia. No ADD.    Vitals:   Vitals:   07/03/19 0441 07/03/19 0528 07/03/19 0802 07/03/19 1158  BP: (!) 162/115 (!) 141/97 (!) 161/103 (!) 140/100  Pulse: (!) 113 100  (!) 103  Resp:  20  18  Temp:    98.5 F (36.9 C)  TempSrc:    Oral  SpO2: 100% 92%  98%  Weight:      Height:        Wt Readings from Last 3 Encounters:  07/02/19 129.3 kg  02/28/19 (!) 154.2 kg  02/20/18 (!) 149.8 kg     Intake/Output Summary (Last 24 hours) at 07/03/2019 1348 Last data filed at 07/03/2019 0300 Gross per 24 hour  Intake 274.06 ml   Output -  Net 274.06 ml    Physical Exam:   GENERAL: Pleasant-appearing in no apparent distress.  HEAD, EYES, EARS, NOSE AND THROAT: Atraumatic, normocephalic. Extraocular muscles are intact. Pupils equal and reactive to light. Sclerae anicteric. No conjunctival injection. No oro-pharyngeal erythema.  NECK: Supple. There is no jugular venous distention. No bruits, no lymphadenopathy, no thyromegaly.  HEART: Regular rate and rhythm,. No murmurs, no rubs, no clicks.  LUNGS: Clear to auscultation bilaterally. No rales or rhonchi. No wheezes.  ABDOMEN: Soft, flat, nontender, nondistended. Has good bowel sounds. No hepatosplenomegaly appreciated.  EXTREMITIES: Bilateral lower extremity swelling  warm to touch NEUROLOGIC: The patient is alert, awake, and oriented x3 with no focal motor or sensory deficits appreciated bilaterally.  SKIN: Moist and warm with no rashes appreciated.  Psych: Not anxious, depressed LN: No inguinal LN enlargement    Antibiotics   Anti-infectives (From admission, onward)   Start     Dose/Rate Route Frequency Ordered Stop   07/04/19 1200  vancomycin (VANCOCIN) IVPB 1000 mg/200 mL premix     1,000 mg 200 mL/hr over 60 Minutes Intravenous Every T-Th-Sa (Hemodialysis) 07/03/19 0839     07/04/19 1200  ceFEPIme (MAXIPIME) 2 g in sodium chloride 0.9 % 100 mL IVPB     2 g 200 mL/hr over 30 Minutes Intravenous Every T-Th-Sa (Hemodialysis) 07/03/19 0839     07/02/19 2330  metroNIDAZOLE (FLAGYL) IVPB 500 mg     500 mg 100 mL/hr over 60 Minutes Intravenous Every 8 hours 07/02/19 2245     07/02/19 2300  ceFEPIme (MAXIPIME) 2 g in sodium chloride 0.9 % 100 mL IVPB  Status:  Discontinued     2 g 200 mL/hr over 30 Minutes Intravenous  Once 07/02/19 2245 07/02/19 2252   07/02/19 2300  vancomycin (VANCOCIN) IVPB 1000 mg/200 mL premix  Status:  Discontinued     1,000 mg 200 mL/hr over 60 Minutes Intravenous  Once 07/02/19 2245 07/02/19 2251   07/02/19 2030  ceFEPIme (MAXIPIME)  2 g in sodium chloride 0.9 % 100 mL IVPB     2 g 200 mL/hr over 30 Minutes Intravenous  Once 07/02/19 2019 07/02/19 2134   07/02/19 2030  metroNIDAZOLE (FLAGYL) IVPB 500 mg     500 mg 100 mL/hr over 60 Minutes Intravenous  Once 07/02/19 2019 07/02/19 2316   07/02/19 2030  vancomycin (VANCOCIN) IVPB 1000 mg/200 mL premix  Status:  Discontinued     1,000 mg 200 mL/hr over 60 Minutes Intravenous  Once 07/02/19 2019 07/02/19 2021   07/02/19 2030  vancomycin (VANCOCIN) 2,500 mg in sodium chloride 0.9 % 500 mL IVPB     2,500 mg 250 mL/hr over 120 Minutes Intravenous  Once 07/02/19 2021 07/03/19 0007      Medications   Scheduled Meds: . amLODipine  10 mg Oral Daily  . aspirin EC  81 mg Oral Daily  . calcium acetate  1,334 mg Oral TID WC  . carvedilol  25 mg Oral BID WC  . Chlorhexidine Gluconate Cloth  6 each Topical Q0600  . cloNIDine  0.1 mg Oral TID  . heparin  5,000 Units Subcutaneous Q8H  . insulin aspart  0-5 Units Subcutaneous QHS  . insulin aspart  0-9 Units Subcutaneous TID WC  . isosorbide mononitrate  30 mg Oral BID  . torsemide  40 mg Oral Daily   Continuous Infusions: . sodium chloride 30 mL (07/03/19 0940)  . [START ON 07/04/2019] ceFEPime (MAXIPIME) IV    . metronidazole 500 mg (07/03/19 0941)  . [START ON 07/04/2019] vancomycin     PRN Meds:.sodium chloride, HYDROmorphone (DILAUDID) injection, hydrOXYzine, labetalol   Data Review:   Micro Results Recent Results (from the past 240 hour(s))  Culture, blood (Routine x 2)     Status: None (Preliminary result)   Collection Time: 07/02/19  8:08 PM   Specimen: BLOOD  Result Value Ref Range Status   Specimen Description BLOOD LEFT ANTECUBITAL  Final   Special Requests   Final    BOTTLES DRAWN AEROBIC AND ANAEROBIC Blood Culture results may not be optimal due to an excessive volume  of blood received in culture bottles   Culture   Final    NO GROWTH < 12 HOURS Performed at [facility], [address]  [place], [place] [address]    Report Status PENDING  Incomplete  Culture, blood (Routine x 2)     Status: None (Preliminary result)   Collection Time: 07/02/19  8:08 PM   Specimen: BLOOD  Result Value Ref Range Status   Specimen Description BLOOD BLOOD LEFT HAND  Final   Special Requests   Final    BOTTLES DRAWN AEROBIC AND ANAEROBIC Blood Culture adequate volume   Culture   Final    NO GROWTH < 12 HOURS Performed at [facility], [address], [place], [place] [address]    Report Status PENDING  Incomplete  SARS CORONAVIRUS 2 (TAT 6-24 HRS) Nasopharyngeal Nasopharyngeal Swab     Status: None   Collection Time: 07/02/19 10:48 PM   Specimen: Nasopharyngeal Swab  Result Value Ref Range Status   SARS Coronavirus 2 NEGATIVE NEGATIVE Final    Comment: (NOTE) SARS-CoV-2 target nucleic acids are NOT DETECTED. The SARS-CoV-2 RNA is generally detectable in upper and lower respiratory specimens during the acute phase of infection. Negative results do not preclude SARS-CoV-2 infection, do not rule out co-infections with other pathogens, and should not be used as the sole basis for treatment or other patient management decisions. Negative results must be combined with clinical observations, patient history, and epidemiological information. The expected result is Negative. Fact Sheet for Patients: SugarRoll.be Fact Sheet for Healthcare Providers: https://www.woods-mathews.com/ This test is not yet approved or cleared by the [place] FDA and  has been authorized for detection and/or diagnosis of SARS-CoV-2 by FDA under an Emergency Use Authorization (EUA). This EUA will remain  in effect (meaning this test can be used) for the duration of the COVID-19 declaration under Section 56 4(b)(1) of the Act, 21 U.S.C. section 360bbb-3(b)(1), unless the authorization is terminated or revoked sooner. Performed at [facility], [place] [address] [place], [place] [address]     Radiology Reports Dg Tibia/fibula Right  Result Date: 07/02/2019 CLINICAL DATA:  Right lower leg swelling and bruising. No known injury. EXAM: RIGHT TIBIA AND FIBULA - 2 VIEW COMPARISON:  None. FINDINGS: There is no evidence of fracture or other focal bone lesions. Soft tissues are unremarkable. IMPRESSION: Negative exam. Electronically Signed   By: Inge Rise M.D.   On: 07/02/2019 20:44   US Venous Img Lower Bilateral  Result Date: 07/03/2019 CLINICAL DATA:  Bilateral leg swelling for 1 month EXAM: BILATERAL LOWER EXTREMITY VENOUS DOPPLER ULTRASOUND TECHNIQUE: Gray-scale sonography with graded compression, as well as color Doppler and duplex ultrasound were performed to evaluate the lower extremity deep venous systems from the level of the common femoral vein and including the common femoral, femoral, profunda femoral, popliteal and calf veins including the posterior tibial, peroneal and gastrocnemius veins when visible. The superficial great saphenous vein was also interrogated. Spectral Doppler was utilized to evaluate flow at rest and with distal augmentation maneuvers in the common femoral, femoral and popliteal veins. COMPARISON:  None. FINDINGS: RIGHT LOWER EXTREMITY Common Femoral Vein: No evidence of thrombus. Normal compressibility, respiratory phasicity and response to augmentation. Saphenofemoral Junction: No evidence of thrombus. Normal compressibility and flow on color Doppler imaging. Profunda Femoral Vein: No evidence of thrombus. Normal compressibility and flow on color Doppler imaging. Femoral Vein: No evidence of thrombus. Normal compressibility, respiratory phasicity and response to augmentation. Popliteal Vein:  No evidence of thrombus. Normal compressibility, respiratory phasicity and response to augmentation. Calf Veins: Calf veins are difficult to visualize due to underlying edema. Superficial Great Saphenous Vein: No evidence of thrombus. Normal  compressibility. Venous Reflux:  None. Other Findings:  None. LEFT LOWER EXTREMITY Common Femoral Vein: No evidence of thrombus. Normal compressibility, respiratory phasicity and response to augmentation. Saphenofemoral Junction: No evidence of thrombus. Normal compressibility and flow on color Doppler imaging. Profunda Femoral Vein: No evidence of thrombus. Normal compressibility and flow on color Doppler imaging. Femoral Vein: No evidence of thrombus. Normal compressibility, respiratory phasicity and response to augmentation. Popliteal Vein: No evidence of thrombus. Normal compressibility, respiratory phasicity and response to augmentation. Calf Veins: Calf veins are difficult to visualize due to underlying edema Superficial Great Saphenous Vein: No evidence of thrombus. Normal compressibility. Venous Reflux:  None. Other Findings:  None. IMPRESSION: No central deep venous thrombosis is noted. The calf veins are difficult to visualize. Electronically Signed   By: Inez Catalina M.D.   On: 07/03/2019 00:04   Dg Chest Portable 1 View  Result Date: 07/02/2019 CLINICAL DATA:  Peripheral edema EXAM: PORTABLE CHEST 1 VIEW COMPARISON:  07/05/2017 FINDINGS: Cardiac shadow is mildly prominent. Dialysis catheter is again seen. Vascular congestion is noted likely related to volume overload. No focal infiltrate or effusion is seen. IMPRESSION: Vascular congestion likely related to volume overload. Electronically Signed   By: Inez Catalina M.D.   On: 07/02/2019 20:50     CBC Recent Labs  Lab 07/02/19 1947 07/03/19 0417  WBC 24.1* 21.4*  HGB 8.8* 7.5*  HCT 28.3* 24.7*  PLT 327 296  MCV 88.7 90.1  MCH 27.6 27.4  MCHC 31.1 30.4  RDW 18.1* 18.2*    Chemistries  Recent Labs  Lab 07/02/19 1947 07/03/19 0417  NA 139 138  K 4.6 4.5  CL 100 103  CO2 21* 20*  GLUCOSE 104* 96  BUN 69* 73*  CREATININE 12.98* 13.43*  CALCIUM 8.3* 7.7*  MG  --  1.7    ------------------------------------------------------------------------------------------------------------------ estimated creatinine clearance is 8.1 mL/min (A) (by C-G formula based on SCr of 13.43 mg/dL (H)). ------------------------------------------------------------------------------------------------------------------ No results for input(s): HGBA1C in the last 72 hours. ------------------------------------------------------------------------------------------------------------------ No results for input(s): CHOL, HDL, LDLCALC, TRIG, CHOLHDL, LDLDIRECT in the last 72 hours. ------------------------------------------------------------------------------------------------------------------ No results for input(s): TSH, T4TOTAL, T3FREE, THYROIDAB in the last 72 hours.  Invalid input(s): FREET3 ------------------------------------------------------------------------------------------------------------------ No results for input(s): VITAMINB12, FOLATE, FERRITIN, TIBC, IRON, RETICCTPCT in the last 72 hours.  Coagulation profile Recent Labs  Lab 07/02/19 2008  INR 1.1    No results for input(s): DDIMER in the last 72 hours.  Cardiac Enzymes No results for input(s): CKMB, TROPONINI, MYOGLOBIN in the last 168 hours.  Invalid input(s): CK ------------------------------------------------------------------------------------------------------------------ Invalid input(s): POCBNP    Assessment & Plan   33 y.o. female with past medical history of ESR D on HD Tuesdays, Thursdays and Saturdays, hypertension, diabetes mellitus, CHF, anemia, and asthma presenting to the ED with chief complaints of bilateral leg swelling and pain.  1. Sepsis - Patient meets SIRS criteria, likely secondary to Bilateral leg cellulitis --Continue abx with vancomycin, cefepime and Flagyl - PRN IV and p.o. pain management  2.  ESRD on HD Tuesdays, Thursdays and Saturday -Hemodialysis per  nephrology  3.  Acute on chronic systolicCongestive Heart Failure: Bilateral lower extremity edema Last Echo 01/2016 withEF 50-55% - Chest x-ray shows vascular congestion likely due to volume overload - Continue Demadex - Continue Imdur and Coreg -Hemodialysis  per nephrology  4.HTN- stable + Goal BP <130/80 -Continue clonidine, amlodipine,coreg -PRN antihypertensives  5. Diabetes Mellitus Type 2 with complications - CBG monitoring - SSI - DM education  6. Anemia of CKD:Hgb at8.8.Receiving Epogenwith each treatment.  7. DVT prophylaxis - Heparin       Code Status Orders  (From admission, onward)         Start     Ordered   07/02/19 2244  Full code  Continuous     07/02/19 2245        Code Status History    Date Active Date Inactive Code Status Order ID Comments User Context   02/11/2016 0707 02/16/2016 1708 Full Code [id number]  Saundra Shelling, MD Inpatient   Advance Care Planning Activity           Consults nephrology  DVT Prophylaxis  Lovenox  Lab Results  Component Value Date   PLT 296 07/03/2019     Time Spent in minutes 45 minutes 11am to 1145am  Greater than 50% of time spent in care coordination and counseling patient regarding the condition and plan of care.   Dustin Flock M.D on 07/03/2019 at 1:48 PM  Between 7am to 6pm - Pager - [phone]  After 6pm go to [email or web address] - Proofreader  [employer]   Office  [phone]

## 2019-07-03 NOTE — Progress Notes (Signed)
Pharmacy Antibiotic Note  [name] is a 33 y.o. female admitted on 07/02/2019 with sepsis due to cellulitis  Pharmacy has been consulted for Vancomycin and Cefepime dosing.  Her normal HD schedule is Tuesdays, Thursdays and Saturdays. Her leukocytosis is slightly improved, although still elevated since admission, no recent fevers  Plan: 1) vancomycin 1000 mg TThSa with HD   If she remains on vancomycin therapy pharmacy will obtain a pre-HD level prior to the 3rd HD session  Goal pre-HD level 15-25 mcg/mL  2) cefepime 2 grams TThSa with HD   Height: 5\' 6"  (167.6 cm) Weight: 285 lb (129.3 kg) IBW/kg (Calculated) : 59.3  Temp (24hrs), Avg:98.8 F (37.1 C), Min:98.1 F (36.7 C), Max:99.5 F (37.5 C)  Recent Labs  Lab 07/02/19 1947 07/02/19 2008 07/02/19 2224 07/03/19 0417  WBC 24.1*  --   --  21.4*  CREATININE 12.98*  --   --  13.43*  LATICACIDVEN  --  1.2 1.1  --     Estimated Creatinine Clearance: 8.1 mL/min (A) (by C-G formula based on SCr of 13.43 mg/dL (H)).    No Known Allergies  Antimicrobials this admission: Cefepime 9/15 >>  Vancomycin 9/15 >>  Flagyl 500mg  x 1 on 9/15  Microbiology results:  9/15 BCx: NGTD  9/15 SARS-CoV-2: negative    9/16 MRSA PCR: pending   Thank you for allowing pharmacy to be a part of this patient's care.  Dallie Piles, PharmD 07/03/2019 8:28 AM

## 2019-07-03 NOTE — Progress Notes (Signed)
Hypoglycemic Event  CBG: 62  Treatment: 4 oz apple juice  Symptoms: none  Follow-up CBG: Time:0910 CBG Result:77  Possible Reasons for Event: unknown  Comments/MD notified:Dr. Renaee Munda

## 2019-07-03 NOTE — Progress Notes (Signed)
HD Tx Initiated:     07/03/19 1623  Vital Signs  Temp 98.6 F (37 C)  Temp Source Oral  Pulse Rate (!) 109  Pulse Rate Source Dinamap  Resp 19  BP 128/90  BP Location Right Arm  BP Method Automatic  Patient Position (if appropriate) Lying  Oxygen Therapy  SpO2 93 %  O2 Device Nasal Cannula  O2 Flow Rate (L/min) 3 L/min  Pain Assessment  Pain Scale 0-10  Pain Score 9  During Hemodialysis Assessment  Blood Flow Rate (mL/min) 290 mL/min  Arterial Pressure (mmHg) -100 mmHg  Venous Pressure (mmHg) 90 mmHg  Transmembrane Pressure (mmHg) 70 mmHg  Ultrafiltration Rate (mL/min) 840 mL/min  Dialysate Flow Rate (mL/min) 600 ml/min  Conductivity: Machine  14  HD Safety Checks Performed Yes  Dialysis Fluid Bolus Normal Saline  Bolus Amount (mL) 250 mL  Intra-Hemodialysis Comments Tx initiated

## 2019-07-03 NOTE — Progress Notes (Signed)
Hypoglycemic Event  CBG: 62  Treatment: 4 oz apple juice per protocol Symptoms: asymptomatic Follow-up CBG: [id number] CBG Result:62  Possible Reasons for Event: unknown  Comments/MD notified:Dr. Renaee Munda

## 2019-07-03 NOTE — Progress Notes (Signed)
Pre HD Note:     07/03/19 1615  Vital Signs  Temp 98.6 F (37 C)  Temp Source Oral  Pulse Rate 63  Pulse Rate Source Dinamap  Resp 11  BP (!) 143/91  BP Location Right Arm  BP Method Automatic  Patient Position (if appropriate) Lying  Oxygen Therapy  SpO2 93 %  O2 Device Nasal Cannula  O2 Flow Rate (L/min) 3 L/min  Pain Assessment  Pain Scale 0-10  Pain Score 9  Time-Out for Hemodialysis  What Procedure? HD   Pt Identifiers(min of two) First/Last Name;MRN/Account#;Pt's DOB(use if MRN/Acct# not available  Correct Site? Yes  Correct Side? Yes  Correct Procedure? Yes  Consents Verified? Yes  Rad Studies Available? N/A  Safety Precautions Reviewed? Yes  Engineer, civil (consulting) Number 7  Station Number 3  UF/Alarm Test Passed  Conductivity: Meter 14  Conductivity: Machine  14  pH 7.4  Reverse Osmosis main  Normal Saline Lot Number [id number]  Dialyzer Lot Number [id number]  Disposable Set Lot Number [id number]  Machine Temperature 98.6 F (37 C)  Musician and Audible Yes  Blood Lines Intact and Secured Yes  Pre Treatment Patient Checks  Vascular access used during treatment Catheter  HD catheter dressing before treatment Peeling edges;No Biopatch;Not dated  Patient is receiving dialysis in a chair  (no)  Hepatitis B Surface Antigen Results  (unknown)  Isolation Initiated  (yes)  Hepatitis B Surface Antibody  (unknown)  Date Hepatitis B Surface Antibody Drawn  (unknown)  Hemodialysis Consent Verified Yes  Hemodialysis Standing Orders Initiated Yes  ECG (Telemetry) Monitor On Yes  Prime Ordered Normal Saline  Length of  DialysisTreatment -hour(s) 3.5 Hour(s)  Dialysis Treatment Comments  (Na 140)  Dialyzer Elisio 17H NR  Dialysate 2K;2.5 Ca  Dialysate Flow Ordered 600  Blood Flow Rate Ordered 400 mL/min  Ultrafiltration Goal 2.5 Liters  Dialysis Blood Pressure Support Ordered Normal Saline  Education / Care Plan  Dialysis Education Provided Yes   Documented Education in Care Plan Yes  Outpatient Plan of Care Reviewed and on Chart Yes

## 2019-07-03 NOTE — Progress Notes (Signed)
HD Tx Completed:     07/03/19 1915  Vital Signs  Temp 98.6 F (37 C)  Temp Source Oral  Pulse Rate (!) 103  Pulse Rate Source Dinamap  Resp (!) 21  BP 138/89  BP Location Right Arm  BP Method Automatic  Patient Position (if appropriate) Lying  Oxygen Therapy  SpO2 100 %  O2 Device Nasal Cannula  Pain Assessment  Pain Scale 0-10  Pain Score 9  Post-Hemodialysis Assessment  Rinseback Volume (mL) 0 mL  KECN 42.7 V  Dialyzer Clearance Lightly streaked  Duration of HD Treatment -hour(s) 3.5 hour(s)  Hemodialysis Intake (mL) 250 mL  UF Total -Machine (mL) 2399 mL  Net UF (mL) 2149 mL  Tolerated HD Treatment Yes  AVG/AVF Arterial Site Held (minutes)  (n/a )  AVG/AVF Venous Site Held (minutes)  (n/a)  Education / Care Plan  Dialysis Education Provided Yes  Documented Education in Care Plan Yes  Outpatient Plan of Care Reviewed and on Chart Yes  Hemodialysis Catheter Right Internal jugular Double-lumen  Placement Date: 02/28/19   Time Out: Correct patient;Correct site;Correct procedure  Maximum sterile barrier precautions: Hand hygiene;Cap;Mask;Large sterile sheet;Sterile gloves;Sterile gown  Site Prep: Chlorhexidine  Local Anesthetic: Injectable - 1...  Site Condition No complications  Blue Lumen Status Heparin locked  Red Lumen Status Heparin locked  Purple Lumen Status N/A  Catheter fill solution Heparin 1000 units/ml  Catheter fill volume (Arterial) 1.7 cc  Catheter fill volume (Venous) 1.7  Dressing Type Biopatch;Occlusive  Dressing Status Clean;Dry;Intact  Interventions New dressing  Drainage Description None  Dressing Change Due 07/09/19

## 2019-07-03 NOTE — Progress Notes (Signed)
Right leg ulcer

## 2019-07-04 ENCOUNTER — Inpatient Hospital Stay: Payer: Medicare Other

## 2019-07-04 LAB — CBC
HCT: 23.5 % — ABNORMAL LOW (ref 36.0–46.0)
Hemoglobin: 7.3 g/dL — ABNORMAL LOW (ref 12.0–15.0)
MCH: 27.3 pg (ref 26.0–34.0)
MCHC: 31.1 g/dL (ref 30.0–36.0)
MCV: 88 fL (ref 80.0–100.0)
Platelets: 264 10*3/uL (ref 150–400)
RBC: 2.67 MIL/uL — ABNORMAL LOW (ref 3.87–5.11)
RDW: 17.9 % — ABNORMAL HIGH (ref 11.5–15.5)
WBC: 16.8 10*3/uL — ABNORMAL HIGH (ref 4.0–10.5)
nRBC: 0 % (ref 0.0–0.2)

## 2019-07-04 LAB — BASIC METABOLIC PANEL
Anion gap: 15 (ref 5–15)
BUN: 55 mg/dL — ABNORMAL HIGH (ref 6–20)
CO2: 23 mmol/L (ref 22–32)
Calcium: 8 mg/dL — ABNORMAL LOW (ref 8.9–10.3)
Chloride: 100 mmol/L (ref 98–111)
Creatinine, Ser: 10.53 mg/dL — ABNORMAL HIGH (ref 0.44–1.00)
GFR calc Af Amer: 5 mL/min — ABNORMAL LOW (ref >60)
GFR calc non Af Amer: 4 mL/min — ABNORMAL LOW (ref >60)
Glucose, Bld: 106 mg/dL — ABNORMAL HIGH (ref 70–99)
Potassium: 4.4 mmol/L (ref 3.5–5.1)
Sodium: 138 mmol/L (ref 135–145)

## 2019-07-04 LAB — HIV ANTIBODY (ROUTINE TESTING W REFLEX): HIV Screen 4th Generation wRfx: NONREACTIVE

## 2019-07-04 LAB — HEMOGLOBIN A1C
Hgb A1c MFr Bld: <4.2 % — ABNORMAL LOW (ref 4.8–5.6)
Mean Plasma Glucose: <74 mg/dL

## 2019-07-04 LAB — GLUCOSE, CAPILLARY
Glucose-Capillary: 85 mg/dL (ref 70–99)
Glucose-Capillary: 87 mg/dL (ref 70–99)
Glucose-Capillary: 99 mg/dL (ref 70–99)

## 2019-07-04 LAB — PHOSPHORUS: Phosphorus: 7.8 mg/dL — ABNORMAL HIGH (ref 2.5–4.6)

## 2019-07-04 IMAGING — CT CT FEMUR *R* W/O CM
2 of 3 series · 8 of 33 positions shown, 10 images · non-contrast
Comparison: Right tibia-fibula x-ray [DATE]

CLINICAL DATA: Right lower extremity pain and swelling

EXAM:
CT OF THE LOWER RIGHT EXTREMITY WITHOUT CONTRAST
TECHNIQUE: Multidetector CT imaging of the right lower extremity was performed
according to the standard protocol.

[Series 8: coronal st right · coronal · 0.52mm/px · 3 of 139 slices shown]
[im 28/139  bone]
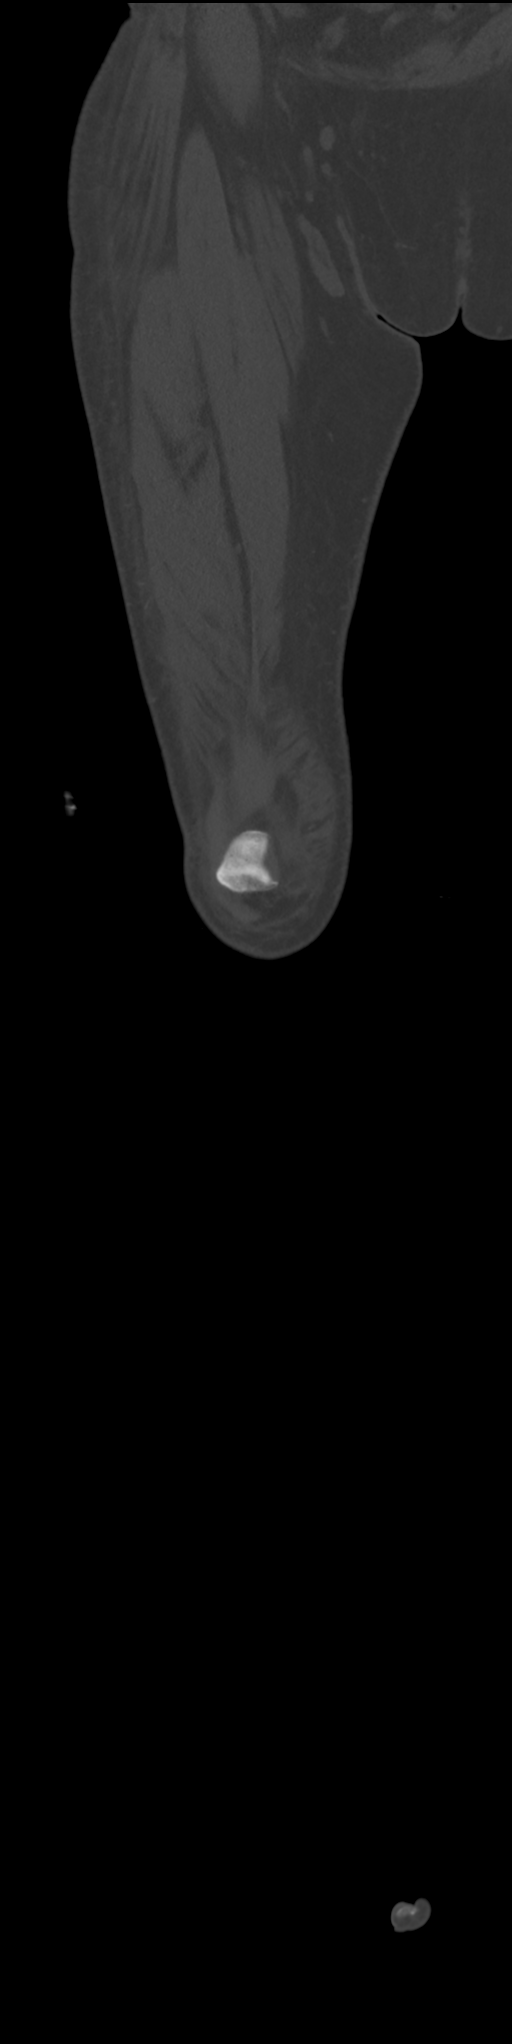
[im 56/139  bone]
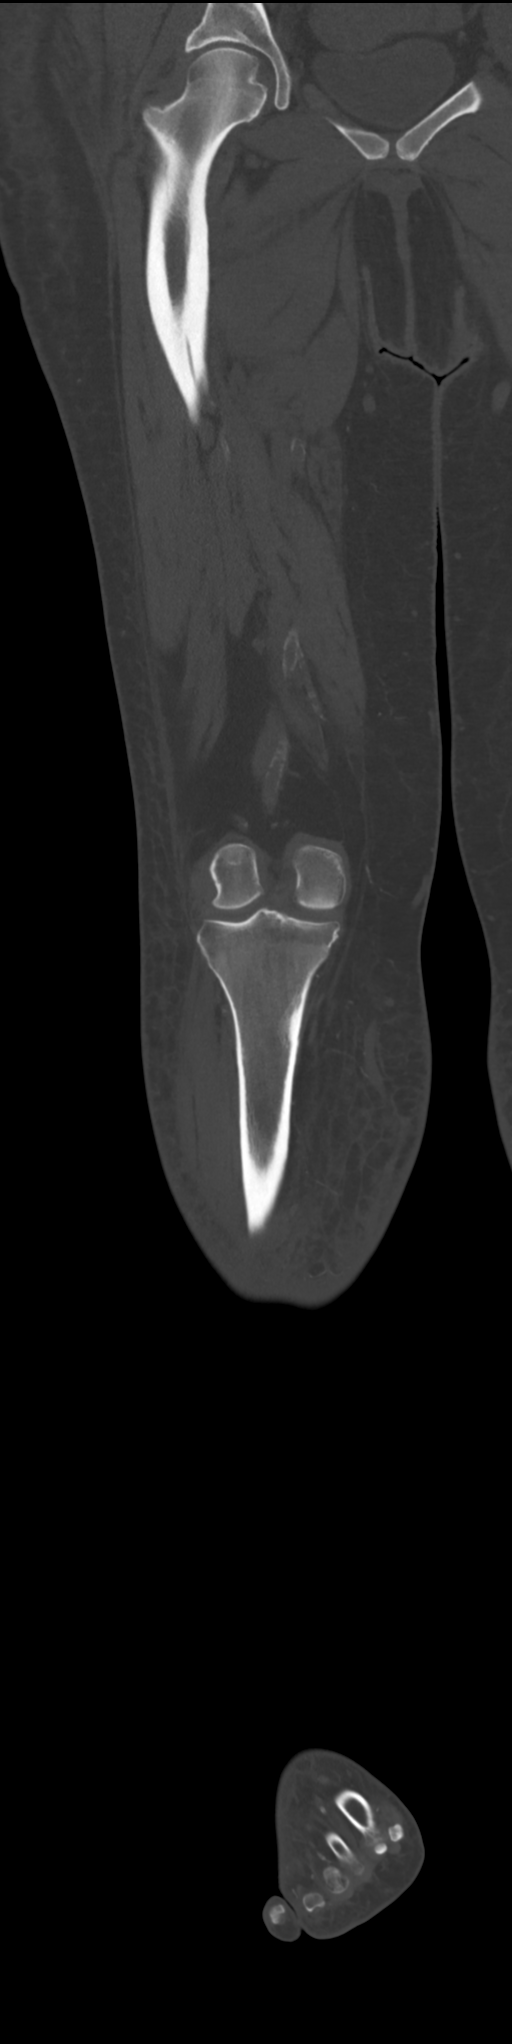
[im 83/139  bone]
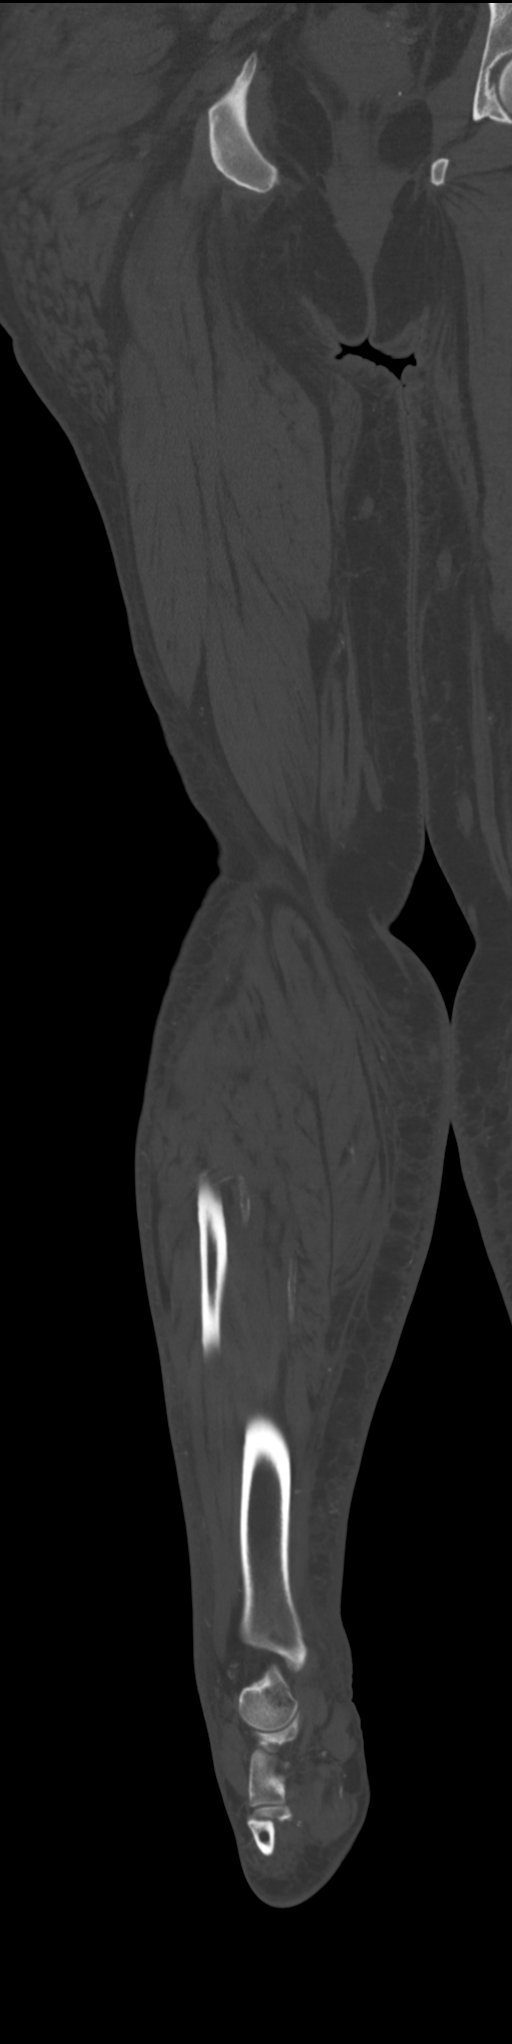

[Series 11: axial st right · axial · 0.53mm/px · z∈[-1130,-420]mm · 5 of 513 slices shown, 7 images]
[im 79/513  soft-tissue]
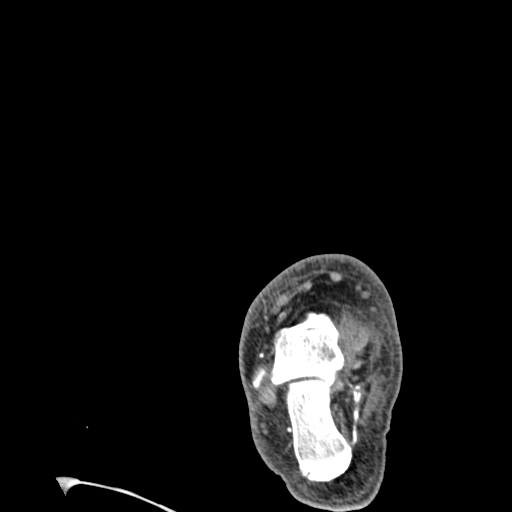
[im 79/513  bone]
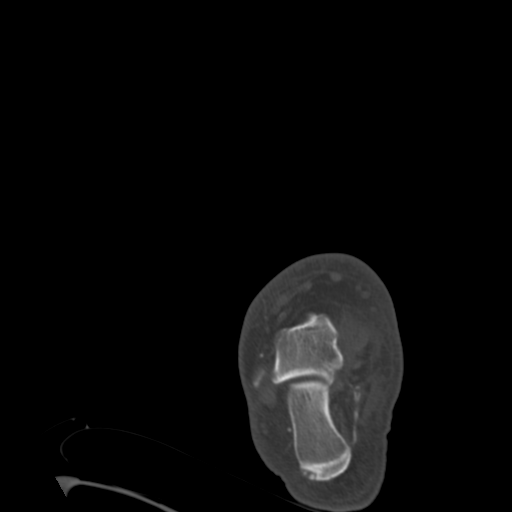
[im 158/513  bone]
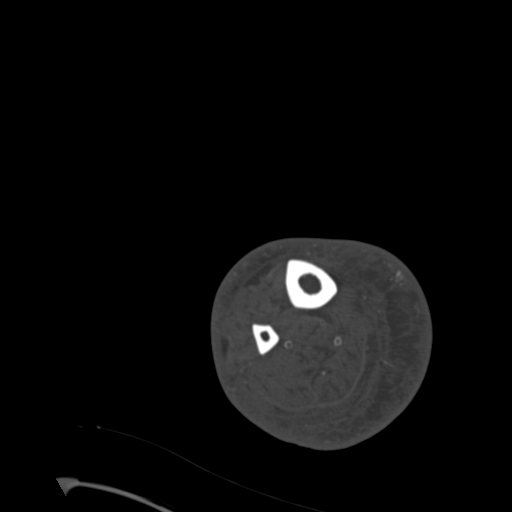
[im 276/513  bone]
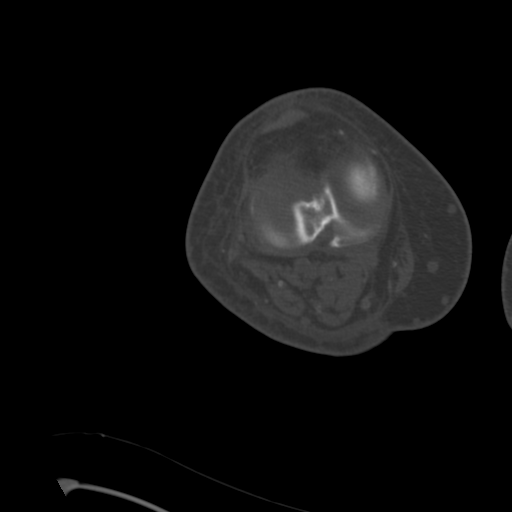
[im 355/513  bone]
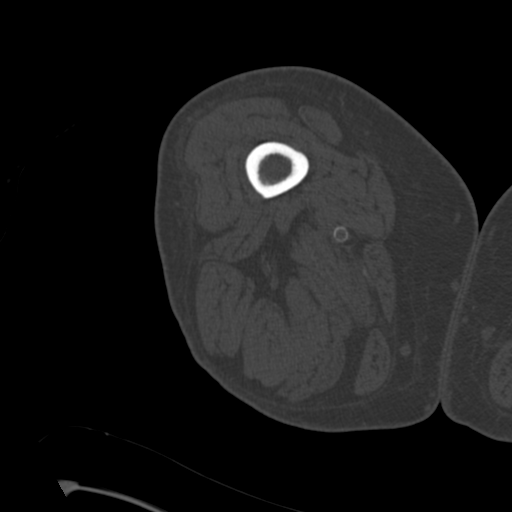
[im 434/513  soft-tissue]
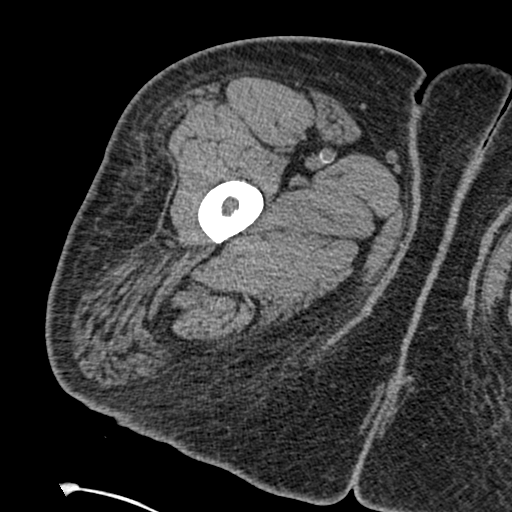
[im 434/513  bone]
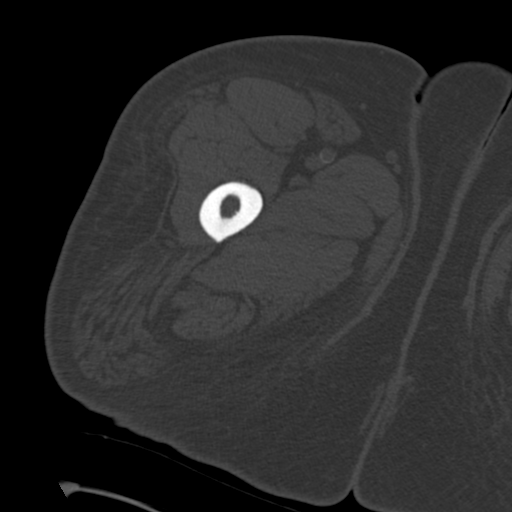

[8 of 33 positions shown; findings below may reference images not displayed]

FINDINGS: Bones/Joint/Cartilage

No acute fracture. No malalignment. Joint spaces are maintained.
Small bidirectional calcaneal enthesophytes. No cortical destruction
or periostitis. No focal bone lesions. No knee joint effusion. No
discernible hip joint effusion. No tibiotalar joint effusion.

Ligaments

Suboptimally assessed by CT.

Muscles and Tendons

Preserved muscle bulk. No CT evidence of intramuscular edema. No
intramuscular fluid collections. Tendons about the hip, knee, and
ankle including the Achilles tendon appear intact. No appreciable
tenosynovial fluid collection.

Soft tissues

There is circumferential subcutaneous edema most pronounced from the
level of the mid calf through the ankle. A thin amount of fluid
tracks along the superficial investing fascia overlying the calf
musculature. No deep fascial fluid collections. No well-defined or
drainable fluid collections within the soft tissues. No soft tissue
gas. No radiopaque foreign body. Extensive vascular calcifications
throughout the lower extremity. Multiple nonenlarged right inguinal
lymph nodes. Peritoneal drainage catheter partially visualized in
the deep pelvis.
IMPRESSION: 1. Nonspecific circumferential soft tissue edema, most pronounced at
the level of the mid right calf through the ankle. Findings can be
seen in fluid overload settings as well as cellulitis in the
appropriate clinical setting. No deep fascial fluid. No well-defined
or drainable fluid collections. No soft tissue gas.
2. No fracture or evidence of osteomyelitis.
3. No visible joint effusion at the right hip, knee, or ankle.

## 2019-07-04 IMAGING — CT CT FOOT*R* W/O CM
2 of 3 series · 8 of 33 positions shown, 10 images · non-contrast
Comparison: Right tibia-fibula x-ray [DATE]

CLINICAL DATA: Right lower extremity pain and swelling

EXAM:
CT OF THE LOWER RIGHT EXTREMITY WITHOUT CONTRAST
TECHNIQUE: Multidetector CT imaging of the right lower extremity was performed
according to the standard protocol.

[Series 8: coronal st right · coronal · 0.52mm/px · 3 of 139 slices shown]
[im 28/139  bone]
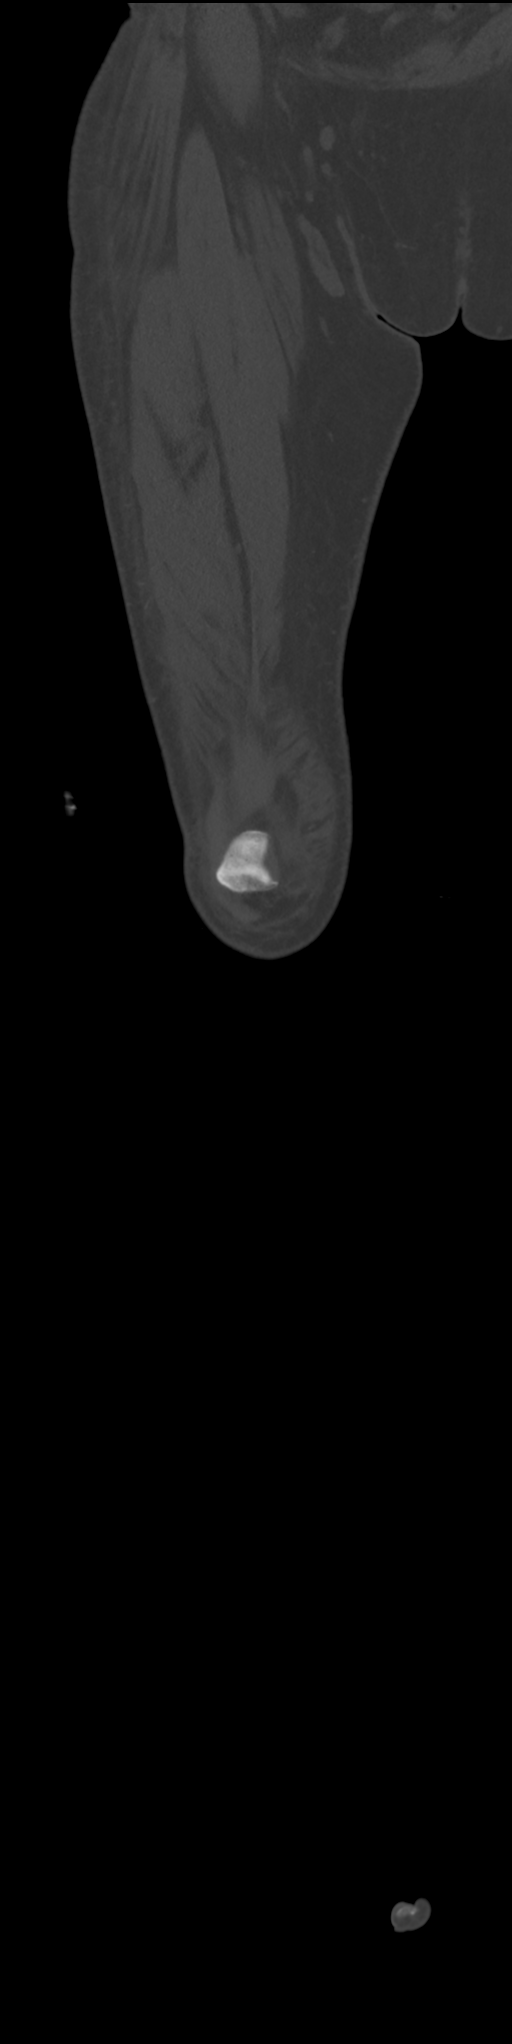
[im 56/139  bone]
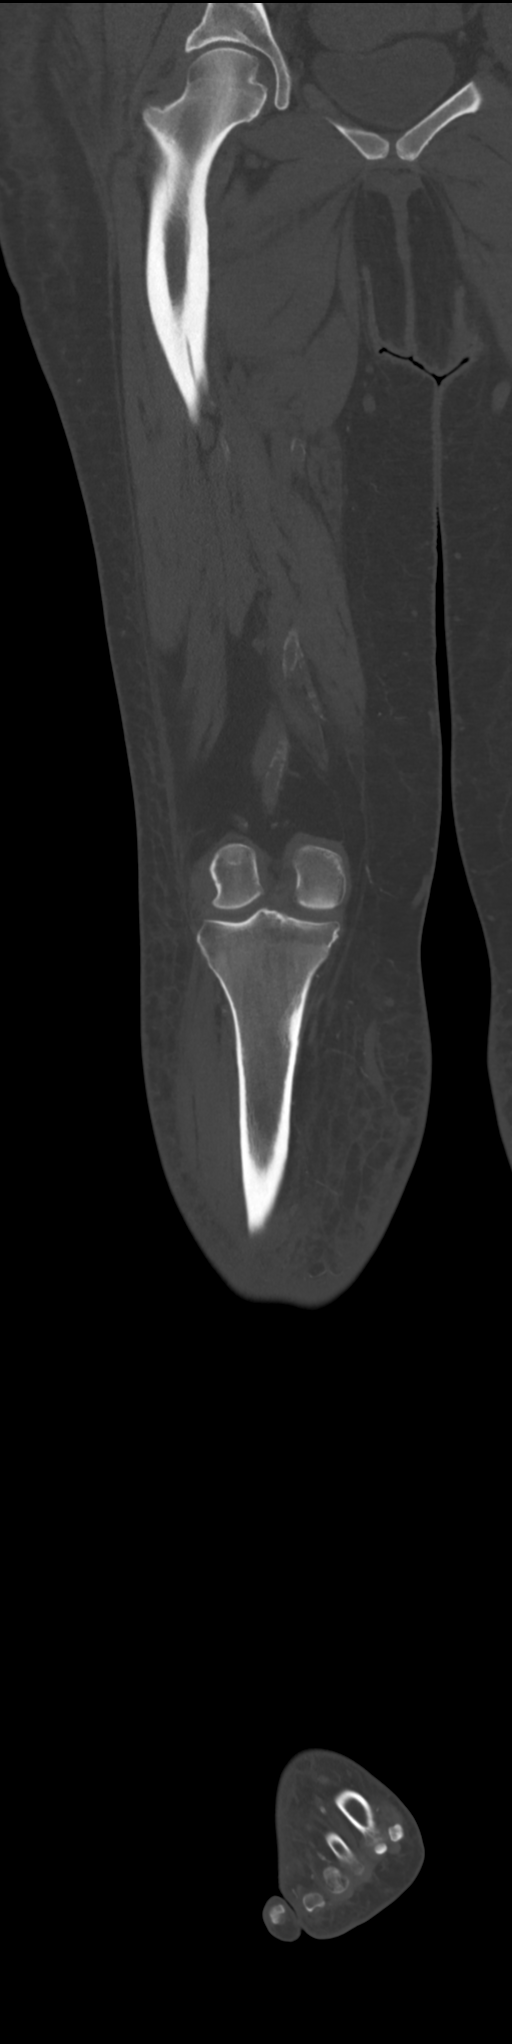
[im 83/139  bone]
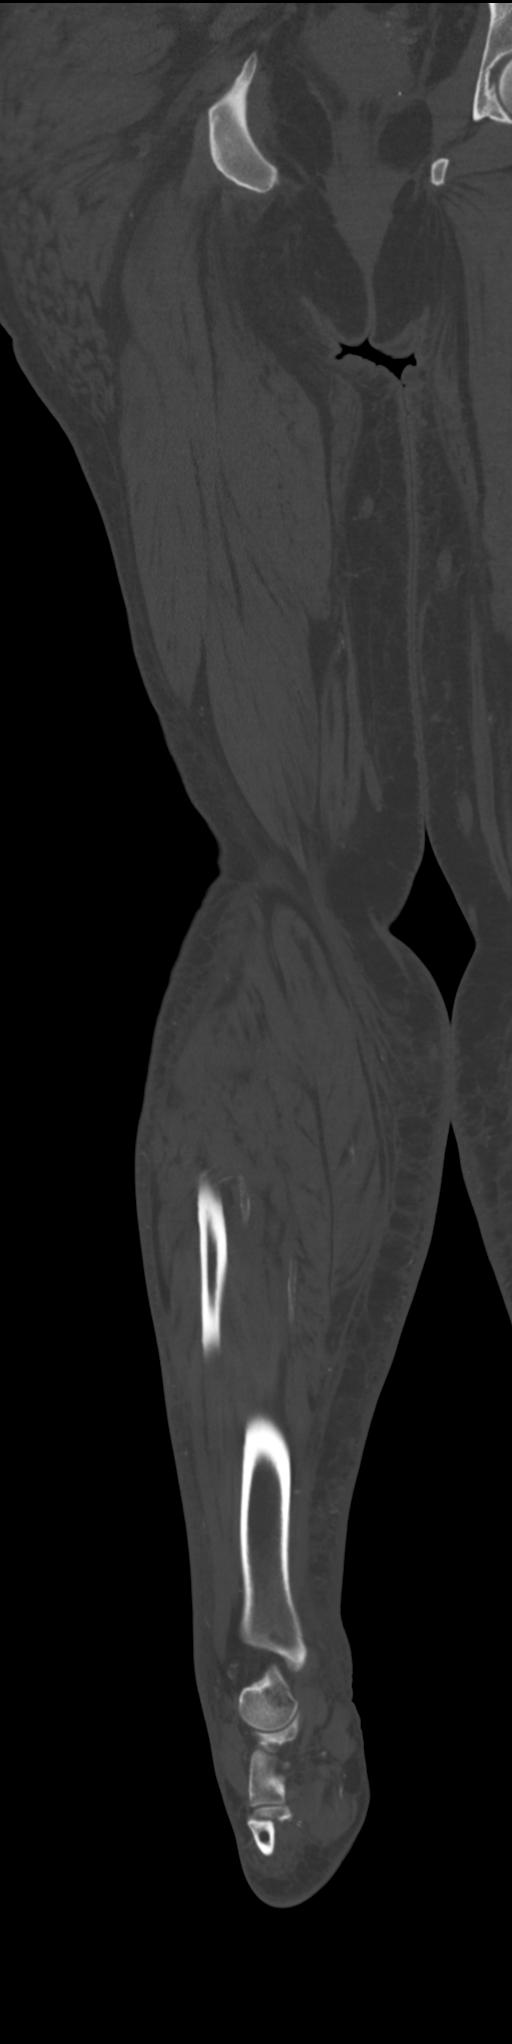

[Series 11: axial st right · axial · 0.53mm/px · z∈[-1130,-420]mm · 5 of 513 slices shown, 7 images]
[im 79/513  soft-tissue]
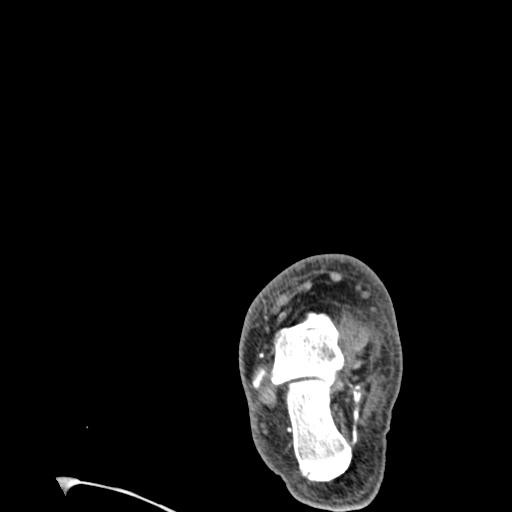
[im 79/513  bone]
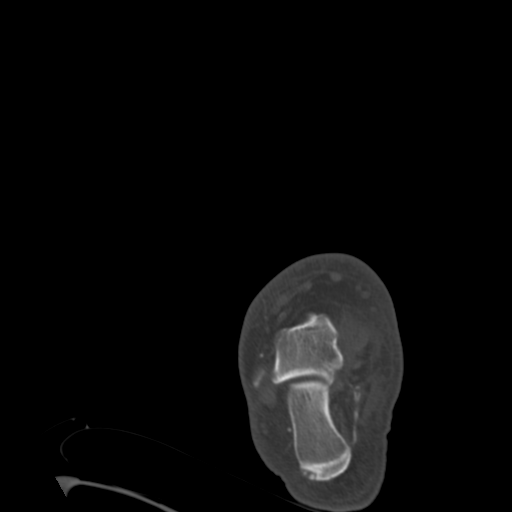
[im 158/513  bone]
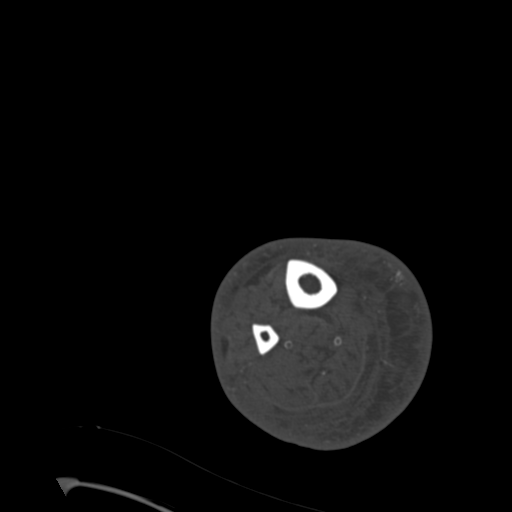
[im 276/513  bone]
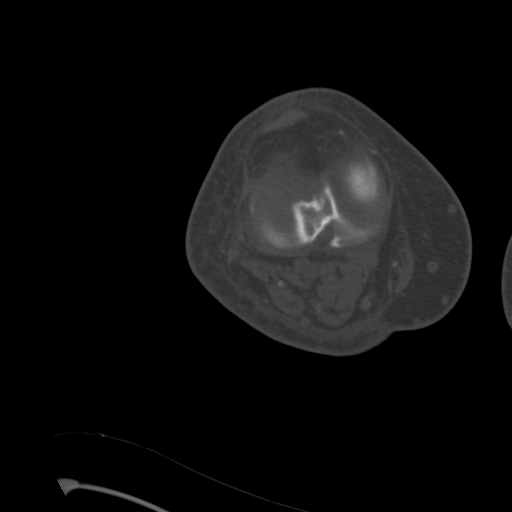
[im 355/513  bone]
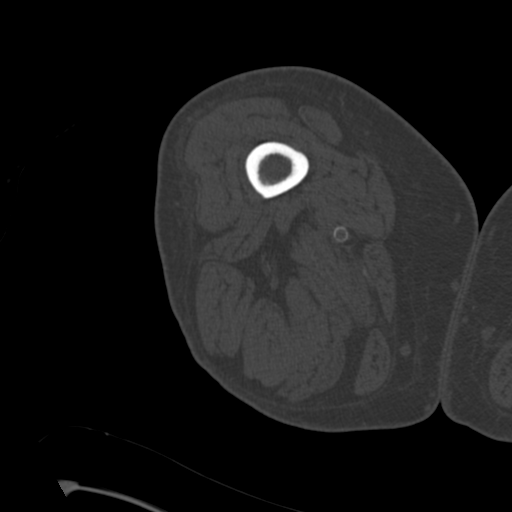
[im 434/513  soft-tissue]
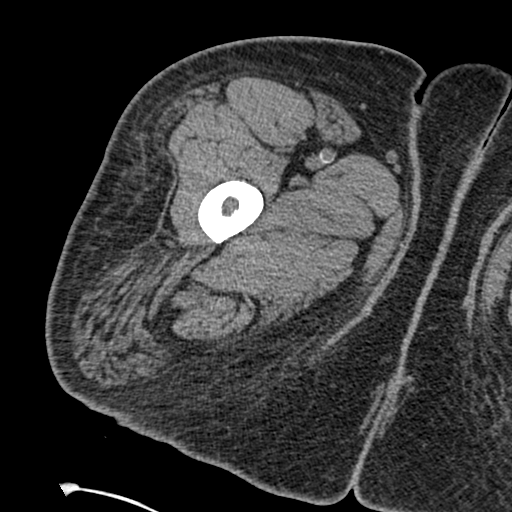
[im 434/513  bone]
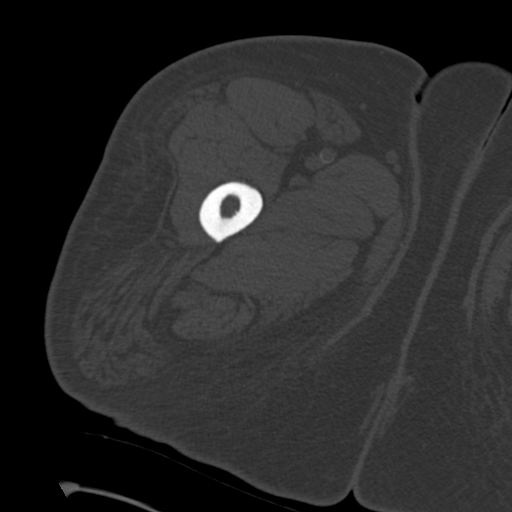

[8 of 33 positions shown; findings below may reference images not displayed]

FINDINGS: Bones/Joint/Cartilage

No acute fracture. No malalignment. Joint spaces are maintained.
Small bidirectional calcaneal enthesophytes. No cortical destruction
or periostitis. No focal bone lesions. No knee joint effusion. No
discernible hip joint effusion. No tibiotalar joint effusion.

Ligaments

Suboptimally assessed by CT.

Muscles and Tendons

Preserved muscle bulk. No CT evidence of intramuscular edema. No
intramuscular fluid collections. Tendons about the hip, knee, and
ankle including the Achilles tendon appear intact. No appreciable
tenosynovial fluid collection.

Soft tissues

There is circumferential subcutaneous edema most pronounced from the
level of the mid calf through the ankle. A thin amount of fluid
tracks along the superficial investing fascia overlying the calf
musculature. No deep fascial fluid collections. No well-defined or
drainable fluid collections within the soft tissues. No soft tissue
gas. No radiopaque foreign body. Extensive vascular calcifications
throughout the lower extremity. Multiple nonenlarged right inguinal
lymph nodes. Peritoneal drainage catheter partially visualized in
the deep pelvis.
IMPRESSION: 1. Nonspecific circumferential soft tissue edema, most pronounced at
the level of the mid right calf through the ankle. Findings can be
seen in fluid overload settings as well as cellulitis in the
appropriate clinical setting. No deep fascial fluid. No well-defined
or drainable fluid collections. No soft tissue gas.
2. No fracture or evidence of osteomyelitis.
3. No visible joint effusion at the right hip, knee, or ankle.

## 2019-07-04 IMAGING — CT CT TIBIA FIBULA *R* W/O CM
2 of 4 series · 8 of 33 positions shown, 10 images · non-contrast
Comparison: Right tibia-fibula x-ray [DATE]

CLINICAL DATA: Right lower extremity pain and swelling

EXAM:
CT OF THE LOWER RIGHT EXTREMITY WITHOUT CONTRAST
TECHNIQUE: Multidetector CT imaging of the right lower extremity was performed
according to the standard protocol.

[Series 8: coronal st right · coronal · 0.52mm/px · 3 of 139 slices shown]
[im 35/139  bone]
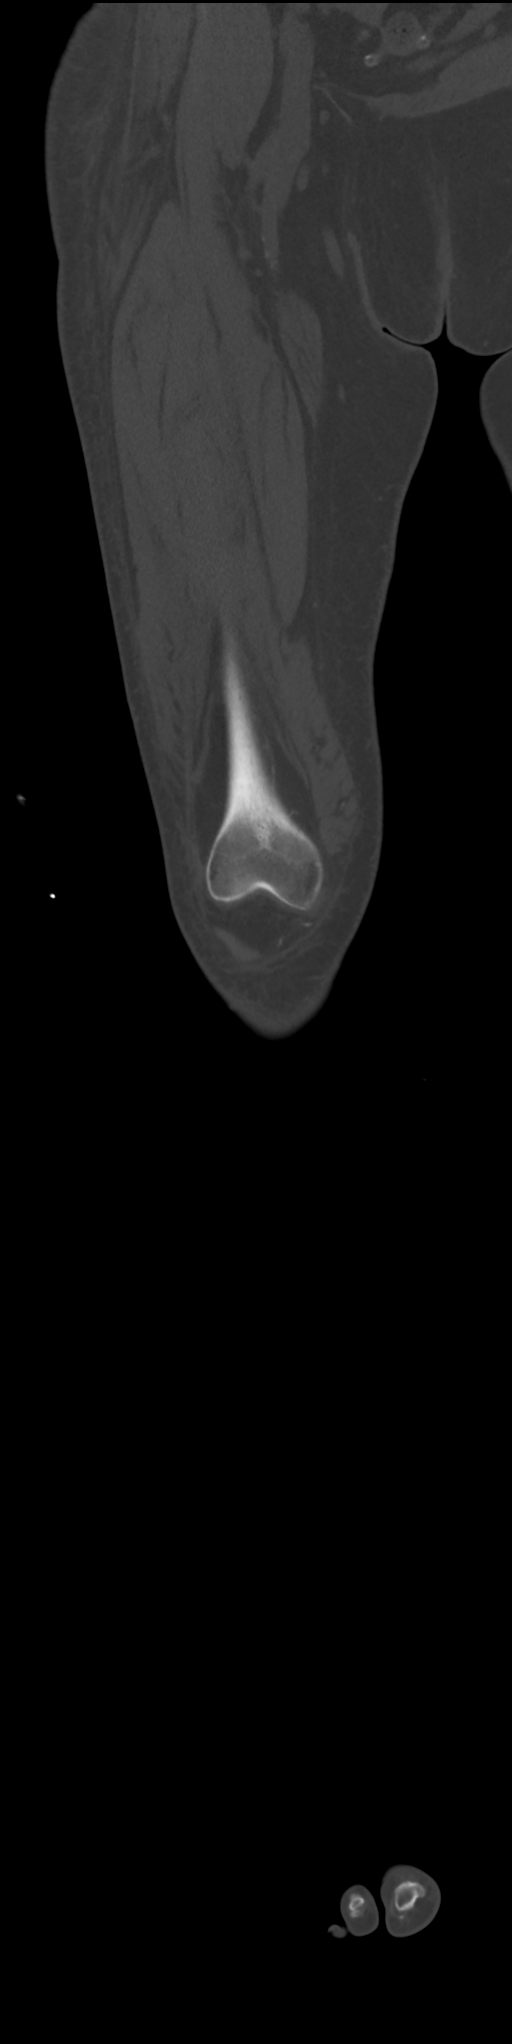
[im 70/139  bone]
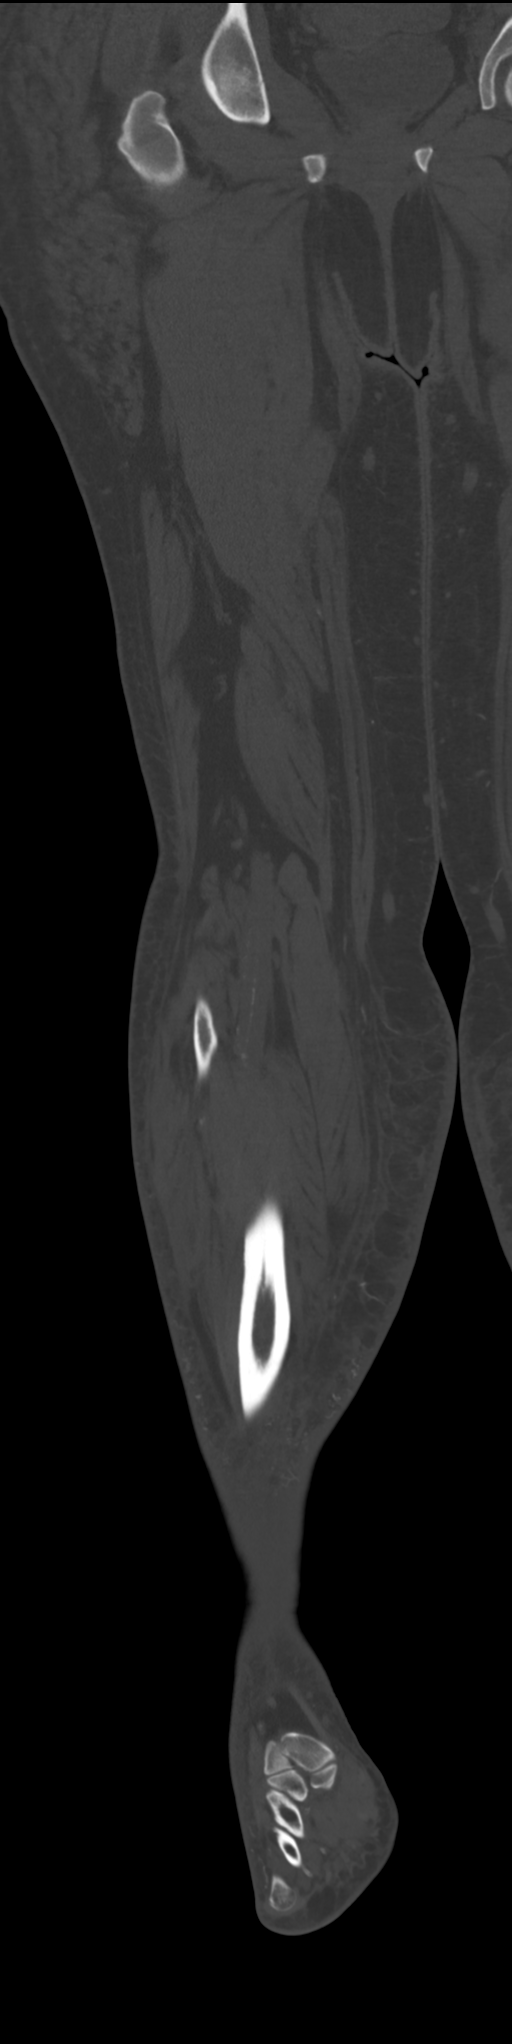
[im 104/139  bone]
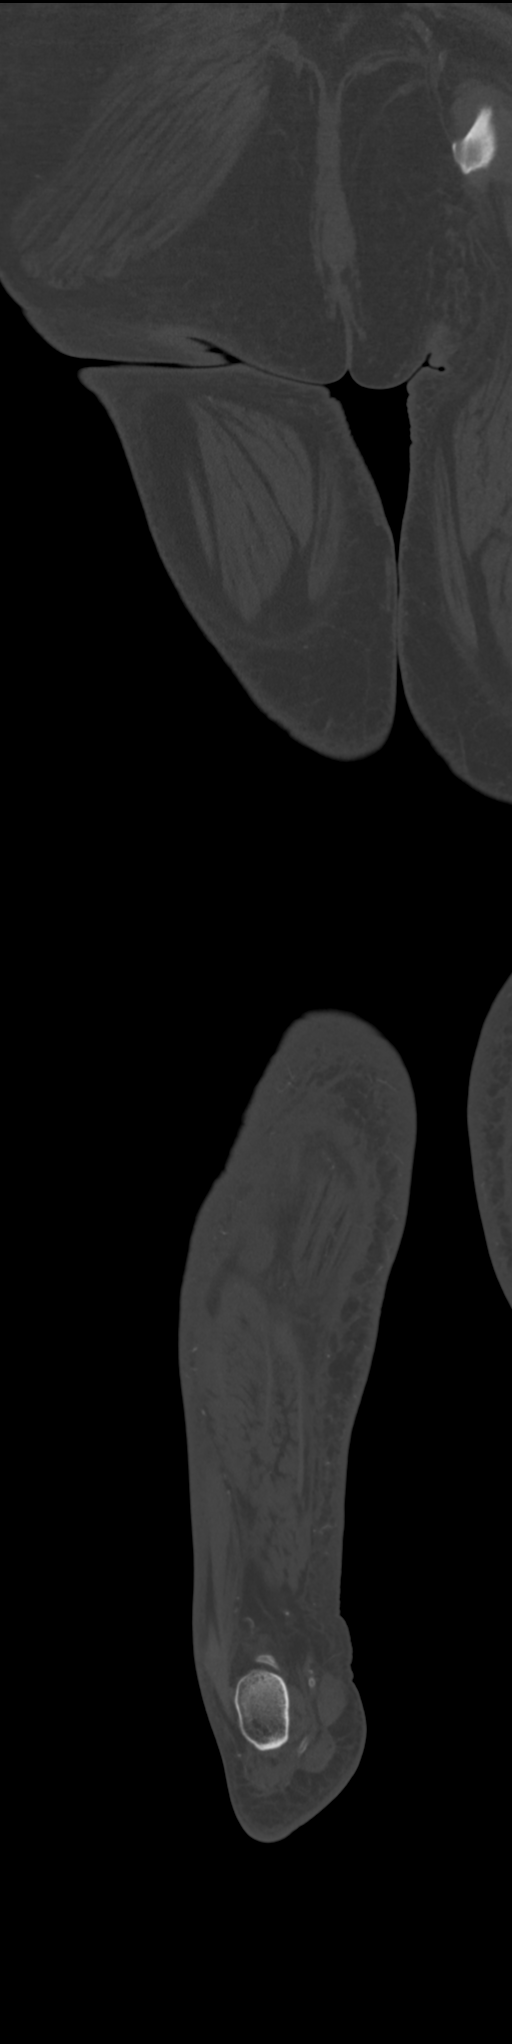

[Series 11: axial st right · axial · 0.53mm/px · z∈[-1130,-420]mm · 5 of 513 slices shown, 7 images]
[im 79/513  soft-tissue]
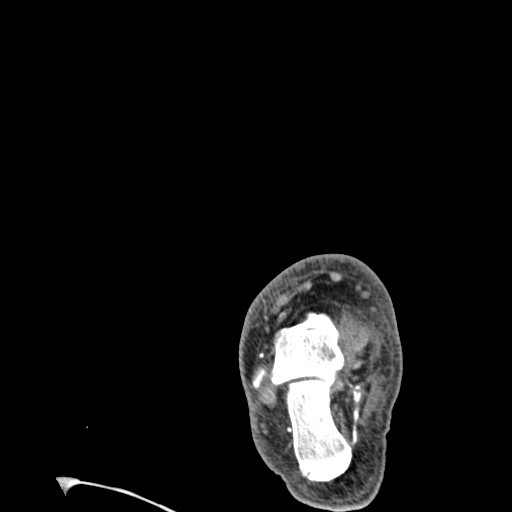
[im 79/513  bone]
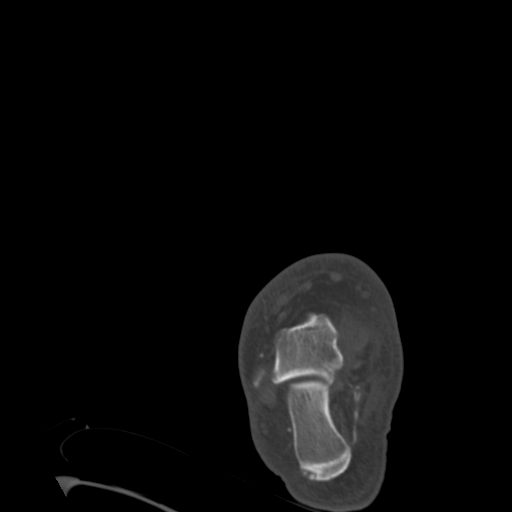
[im 158/513  bone]
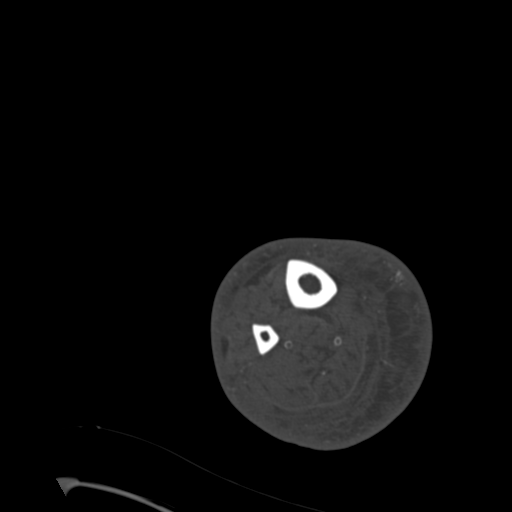
[im 276/513  bone]
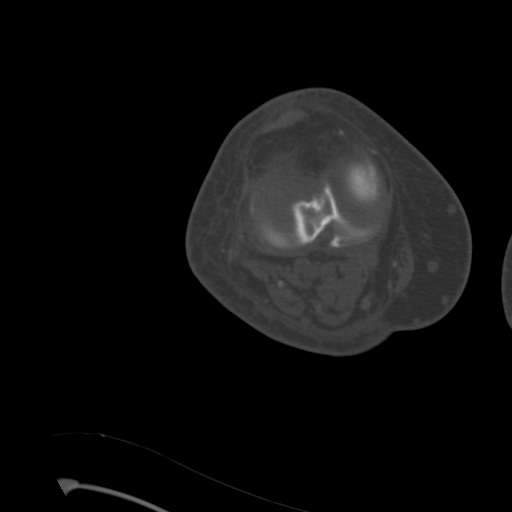
[im 355/513  bone]
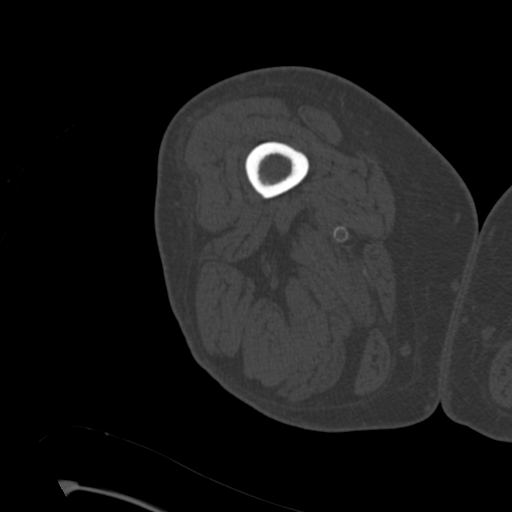
[im 434/513  soft-tissue]
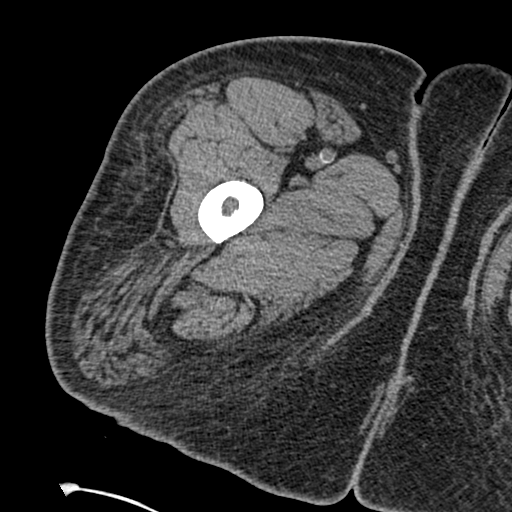
[im 434/513  bone]
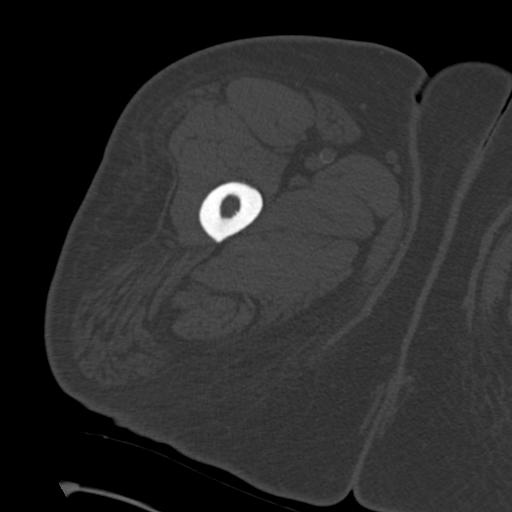

[8 of 33 positions shown; findings below may reference images not displayed]

FINDINGS: Bones/Joint/Cartilage

No acute fracture. No malalignment. Joint spaces are maintained.
Small bidirectional calcaneal enthesophytes. No cortical destruction
or periostitis. No focal bone lesions. No knee joint effusion. No
discernible hip joint effusion. No tibiotalar joint effusion.

Ligaments

Suboptimally assessed by CT.

Muscles and Tendons

Preserved muscle bulk. No CT evidence of intramuscular edema. No
intramuscular fluid collections. Tendons about the hip, knee, and
ankle including the Achilles tendon appear intact. No appreciable
tenosynovial fluid collection.

Soft tissues

There is circumferential subcutaneous edema most pronounced from the
level of the mid calf through the ankle. A thin amount of fluid
tracks along the superficial investing fascia overlying the calf
musculature. No deep fascial fluid collections. No well-defined or
drainable fluid collections within the soft tissues. No soft tissue
gas. No radiopaque foreign body. Extensive vascular calcifications
throughout the lower extremity. Multiple nonenlarged right inguinal
lymph nodes. Peritoneal drainage catheter partially visualized in
the deep pelvis.
IMPRESSION: 1. Nonspecific circumferential soft tissue edema, most pronounced at
the level of the mid right calf through the ankle. Findings can be
seen in fluid overload settings as well as cellulitis in the
appropriate clinical setting. No deep fascial fluid. No well-defined
or drainable fluid collections. No soft tissue gas.
2. No fracture or evidence of osteomyelitis.
3. No visible joint effusion at the right hip, knee, or ankle.

## 2019-07-04 IMAGING — CT CT FOOT*L* W/O CM
2 of 4 series · 8 of 33 positions shown, 10 images · non-contrast
Comparison: None.

CLINICAL DATA: Left lower extremity pain and swelling

EXAM:
CT OF THE LOWER LEFT EXTREMITY WITHOUT CONTRAST
TECHNIQUE: Multidetector CT imaging of the lower left extremity was performed
according to the standard protocol.

[Series 12: axial st left · axial · 0.45mm/px · z∈[-1114,-434]mm · 5 of 512 slices shown, 7 images]
[im 86/512  soft-tissue]
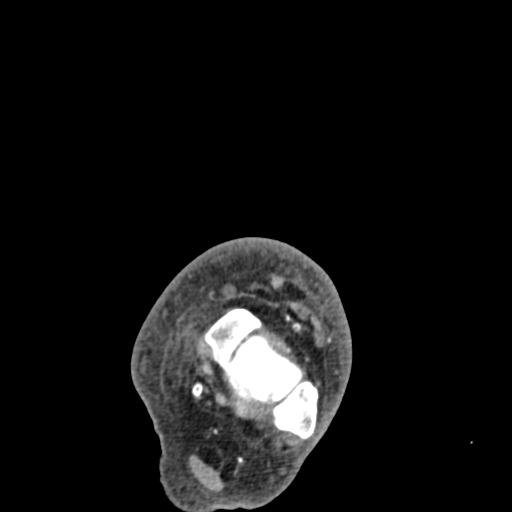
[im 86/512  bone]
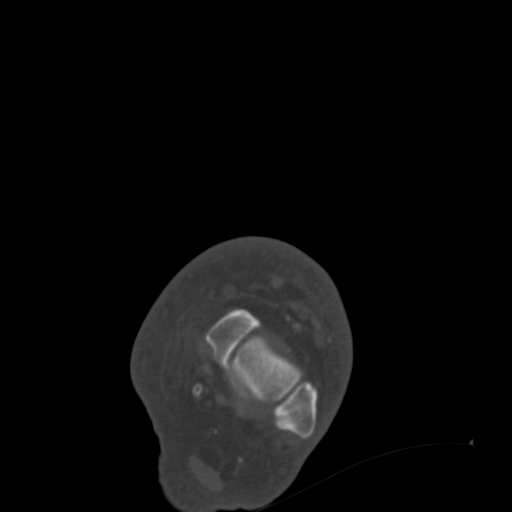
[im 171/512  bone]
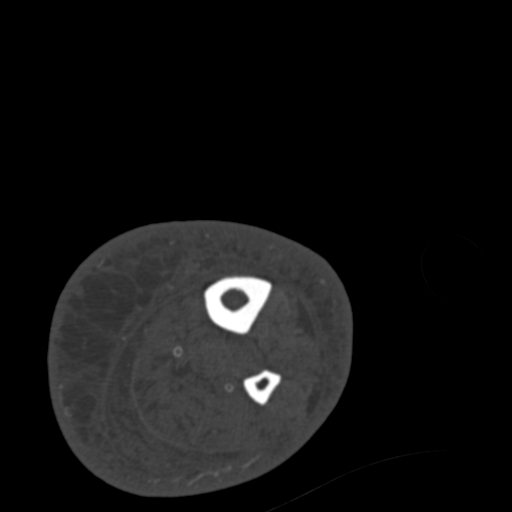
[im 256/512  bone]
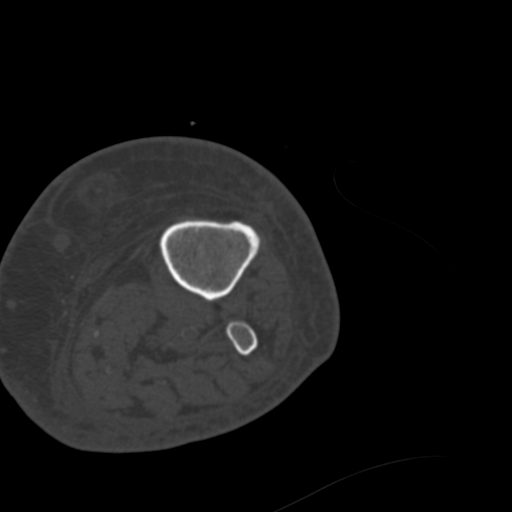
[im 341/512  bone]
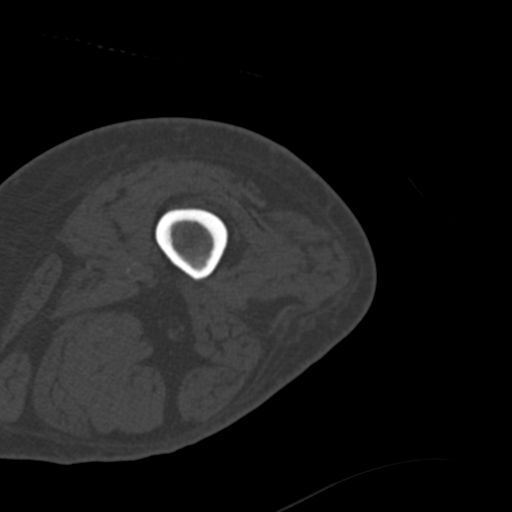
[im 426/512  soft-tissue]
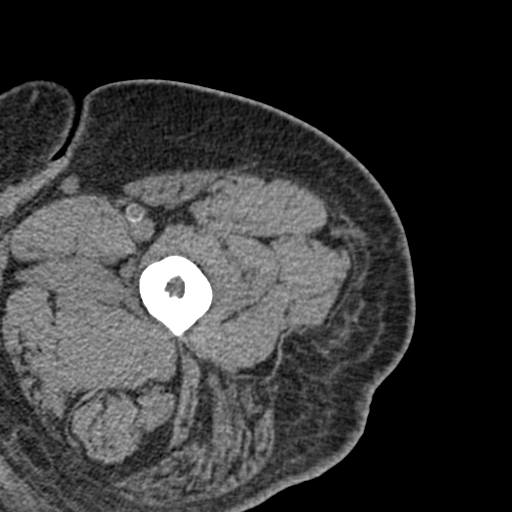
[im 426/512  bone]
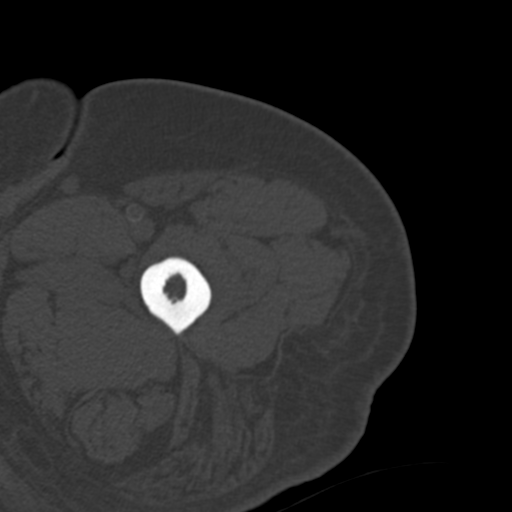

[Series 14: cor axial st left · coronal · 0.50mm/px · 3 of 121 slices shown]
[im 25/121  bone]
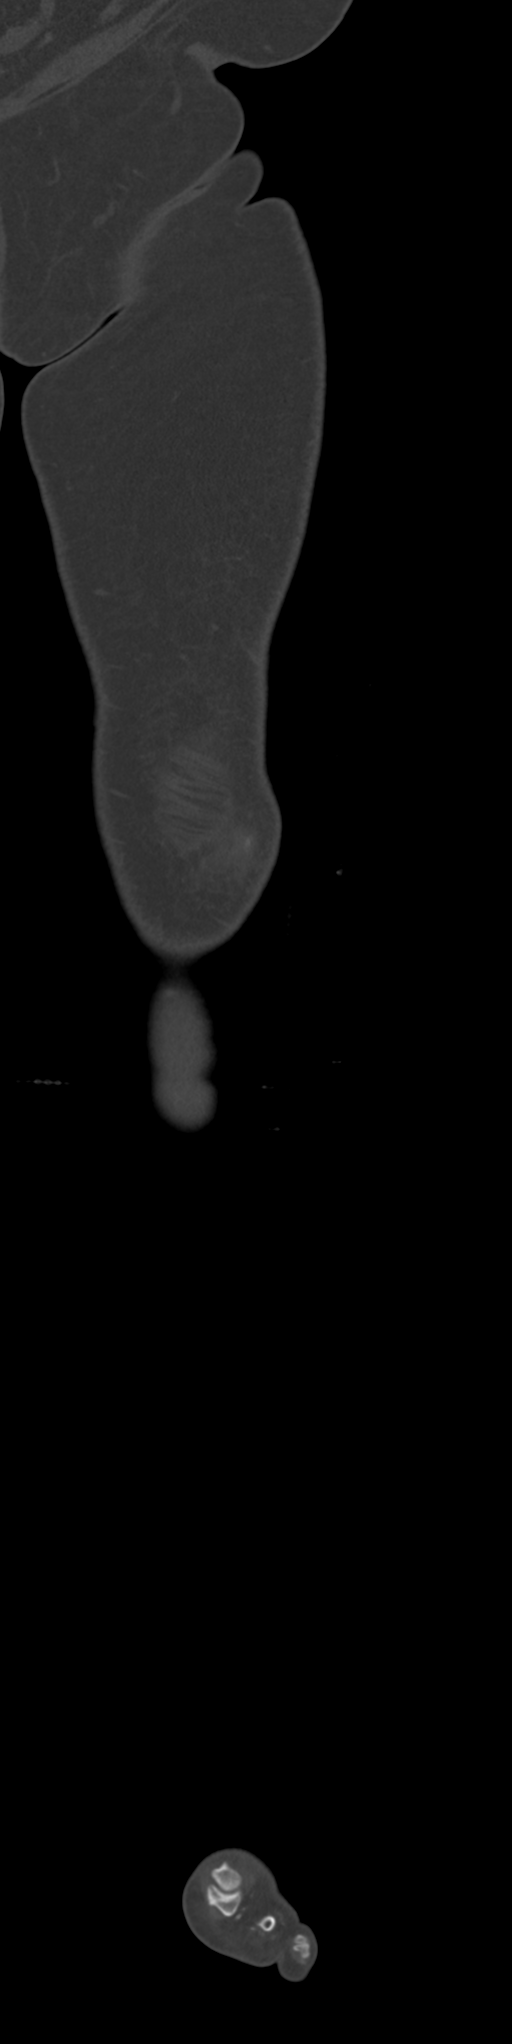
[im 49/121  bone]
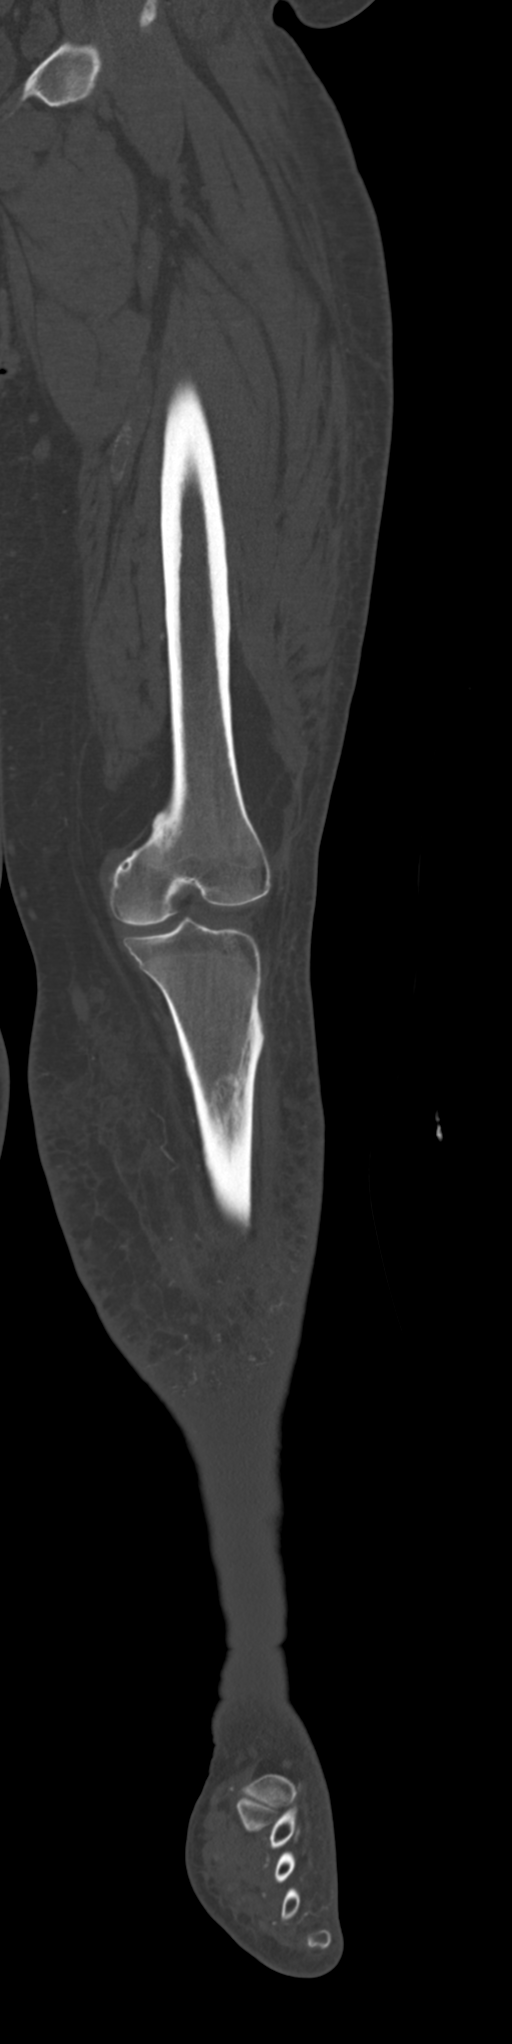
[im 73/121  bone]
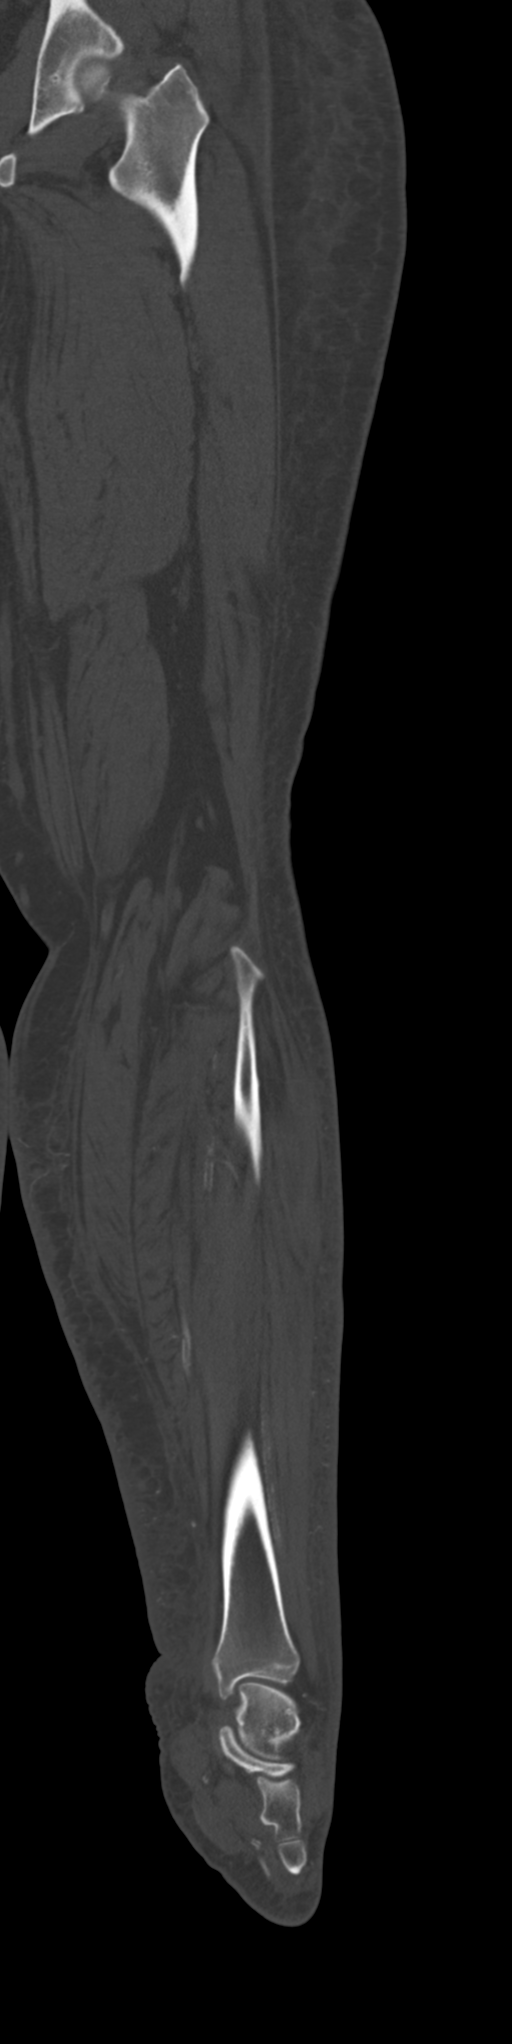

[8 of 33 positions shown; findings below may reference images not displayed]

FINDINGS: Bones/Joint/Cartilage

No acute fracture. No malalignment. Joint spaces are maintained.
Small bidirectional calcaneal enthesophytes. No cortical destruction
or periostitis. No focal bone lesions. No knee joint effusion. No
discernible hip joint effusion. No tibiotalar joint effusion.

Ligaments

Suboptimally assessed by CT.

Muscles and Tendons

Preserved muscle bulk. No CT evidence of intramuscular edema. No
intramuscular fluid collections. Tendons about the hip, knee, and
ankle including the Achilles tendon appear intact. No appreciable
tenosynovial fluid collection.

Soft tissues

There is circumferential subcutaneous edema most pronounced from the
level of the proximal calf through the ankle. A thin amount of fluid
tracks along the superficial investing fascia overlying the calf
musculature. No deep fascial fluid collections. No well-defined or
drainable fluid collections within the soft tissues. No soft tissue
gas. No radiopaque foreign body. Extensive vascular calcifications
throughout the lower extremity. Multiple nonenlarged left inguinal
lymph nodes.
IMPRESSION: 1. Nonspecific circumferential soft tissue edema, most pronounced at
the level of the proximal left calf through the ankle. Findings can
be seen in fluid overload settings as well as cellulitis in the
appropriate clinical setting. No deep fascial fluid. No well-defined
or drainable fluid collections. No soft tissue gas.
2. No fracture or evidence of osteomyelitis.
3. No visible joint effusion at the left hip, knee, or ankle.

## 2019-07-04 IMAGING — CT CT TIBIA FIBULA *L* W/O CM
2 of 4 series · 8 of 33 positions shown, 10 images · non-contrast
Comparison: None.

CLINICAL DATA: Left lower extremity pain and swelling

EXAM:
CT OF THE LOWER LEFT EXTREMITY WITHOUT CONTRAST
TECHNIQUE: Multidetector CT imaging of the lower left extremity was performed
according to the standard protocol.

[Series 12: axial st left · axial · 0.45mm/px · z∈[-1114,-434]mm · 5 of 512 slices shown, 7 images]
[im 86/512  soft-tissue]
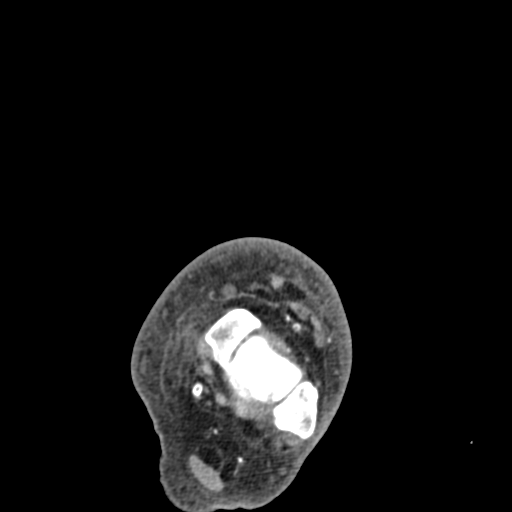
[im 86/512  bone]
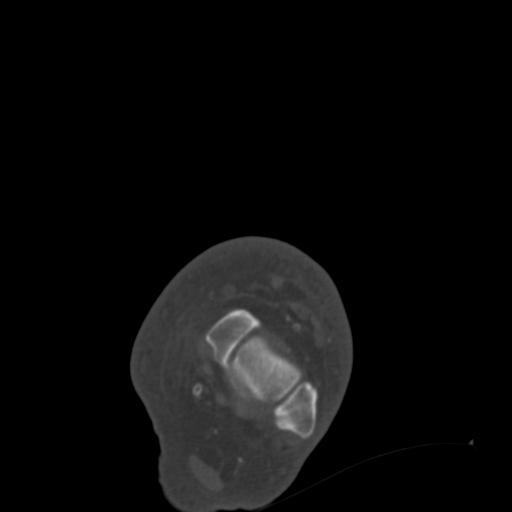
[im 171/512  bone]
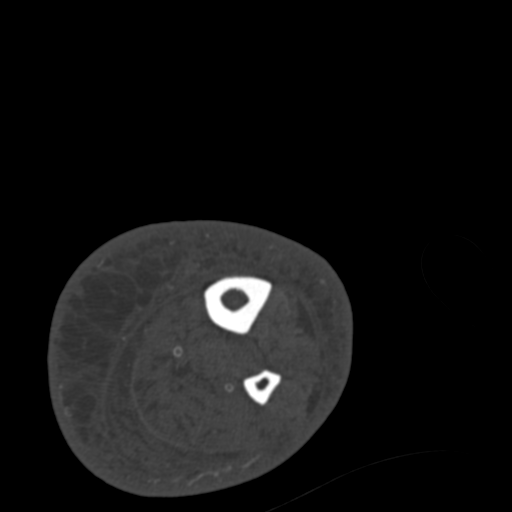
[im 256/512  bone]
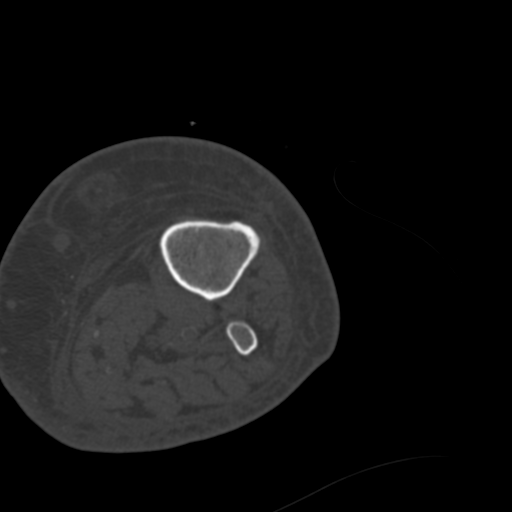
[im 341/512  bone]
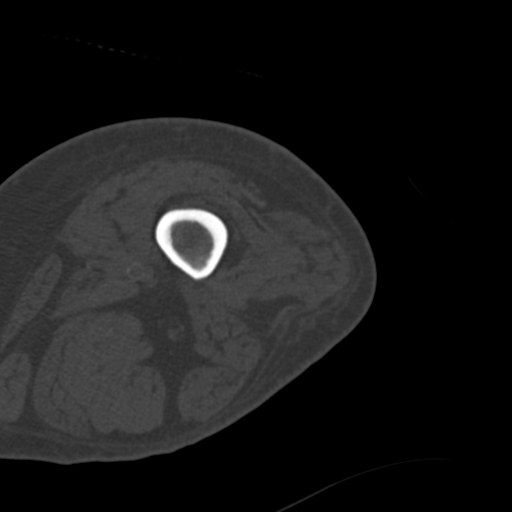
[im 426/512  soft-tissue]
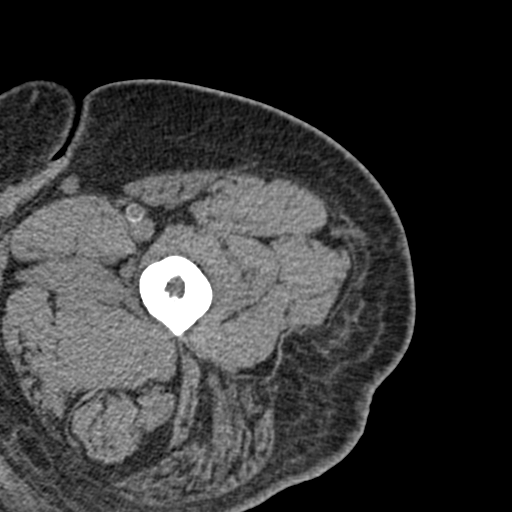
[im 426/512  bone]
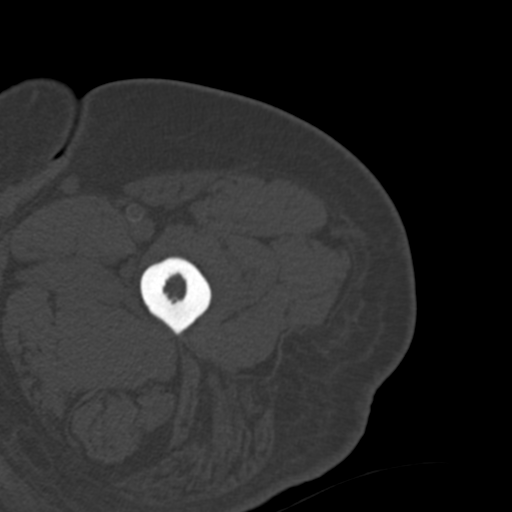

[Series 14: cor axial st left · coronal · 0.50mm/px · 3 of 121 slices shown]
[im 25/121  bone]
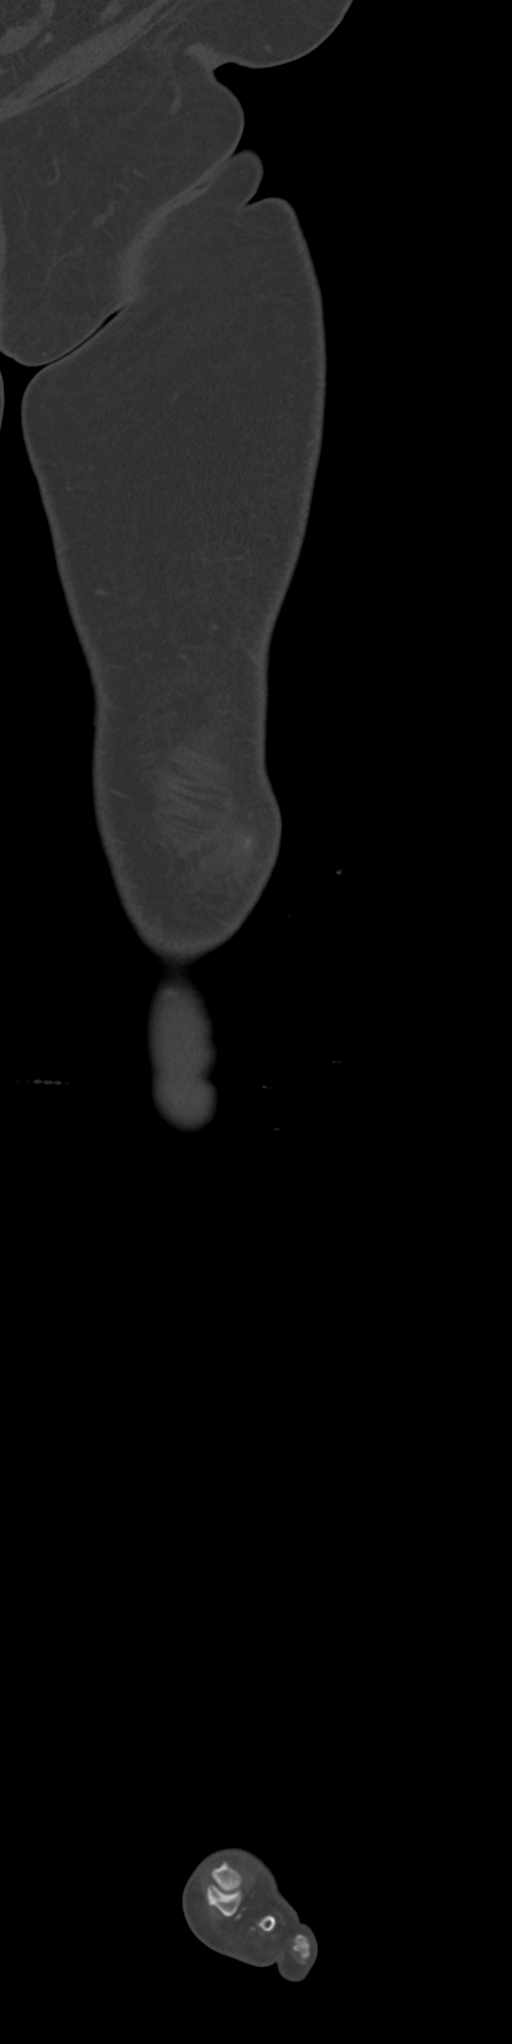
[im 49/121  bone]
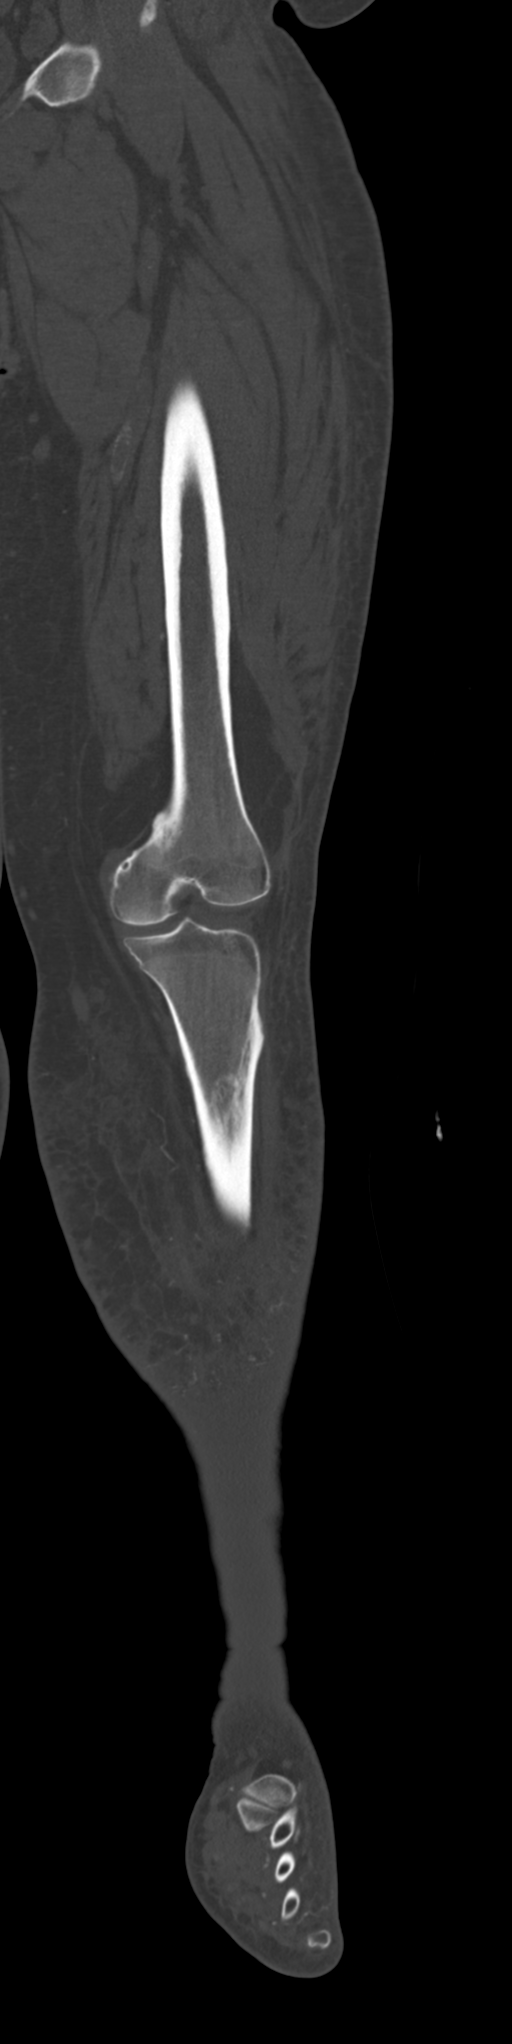
[im 73/121  bone]
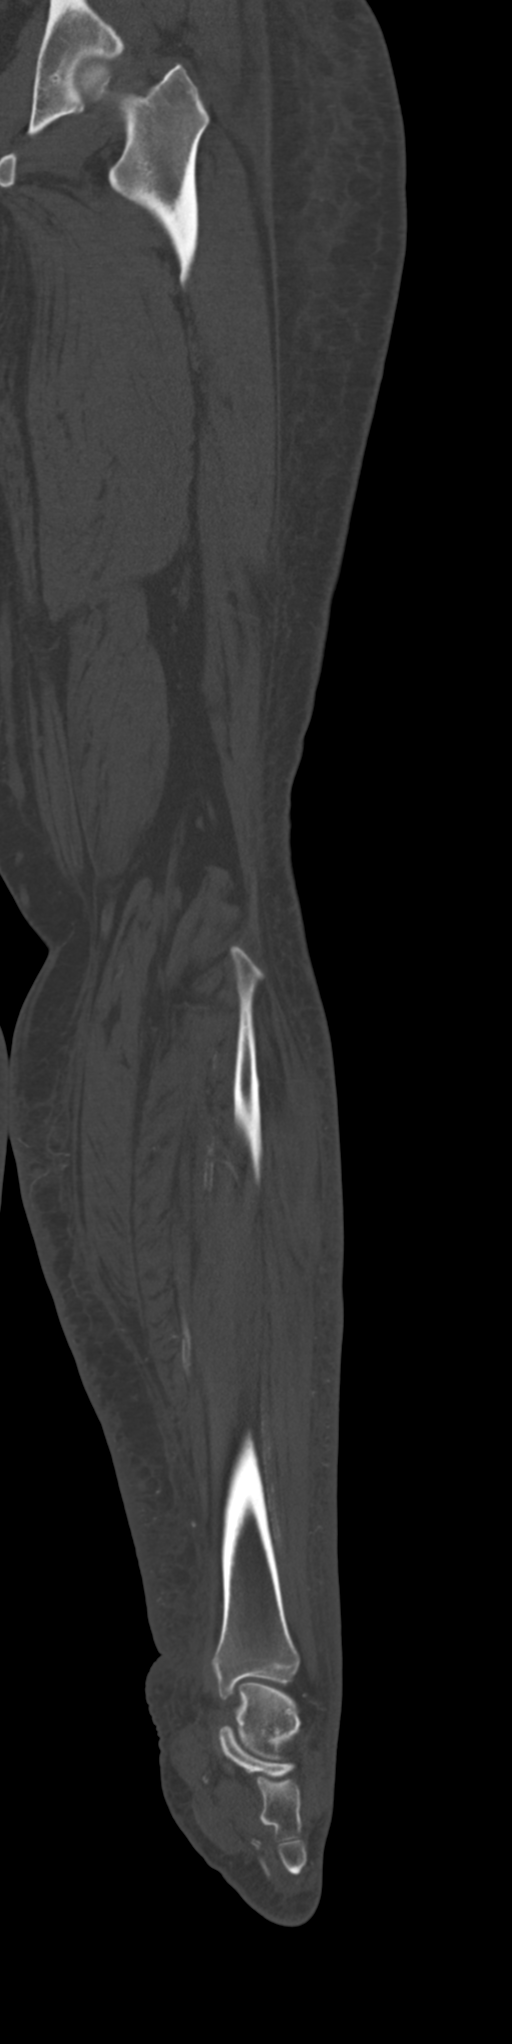

[8 of 33 positions shown; findings below may reference images not displayed]

FINDINGS: Bones/Joint/Cartilage

No acute fracture. No malalignment. Joint spaces are maintained.
Small bidirectional calcaneal enthesophytes. No cortical destruction
or periostitis. No focal bone lesions. No knee joint effusion. No
discernible hip joint effusion. No tibiotalar joint effusion.

Ligaments

Suboptimally assessed by CT.

Muscles and Tendons

Preserved muscle bulk. No CT evidence of intramuscular edema. No
intramuscular fluid collections. Tendons about the hip, knee, and
ankle including the Achilles tendon appear intact. No appreciable
tenosynovial fluid collection.

Soft tissues

There is circumferential subcutaneous edema most pronounced from the
level of the proximal calf through the ankle. A thin amount of fluid
tracks along the superficial investing fascia overlying the calf
musculature. No deep fascial fluid collections. No well-defined or
drainable fluid collections within the soft tissues. No soft tissue
gas. No radiopaque foreign body. Extensive vascular calcifications
throughout the lower extremity. Multiple nonenlarged left inguinal
lymph nodes.
IMPRESSION: 1. Nonspecific circumferential soft tissue edema, most pronounced at
the level of the proximal left calf through the ankle. Findings can
be seen in fluid overload settings as well as cellulitis in the
appropriate clinical setting. No deep fascial fluid. No well-defined
or drainable fluid collections. No soft tissue gas.
2. No fracture or evidence of osteomyelitis.
3. No visible joint effusion at the left hip, knee, or ankle.

## 2019-07-04 IMAGING — CT CT FEMUR *L* W/O CM
2 of 4 series · 8 of 33 positions shown, 10 images · non-contrast
Comparison: None.

CLINICAL DATA: Left lower extremity pain and swelling

EXAM:
CT OF THE LOWER LEFT EXTREMITY WITHOUT CONTRAST
TECHNIQUE: Multidetector CT imaging of the lower left extremity was performed
according to the standard protocol.

[Series 12: axial st left · axial · 0.45mm/px · z∈[-1114,-434]mm · 5 of 512 slices shown, 7 images]
[im 86/512  soft-tissue]
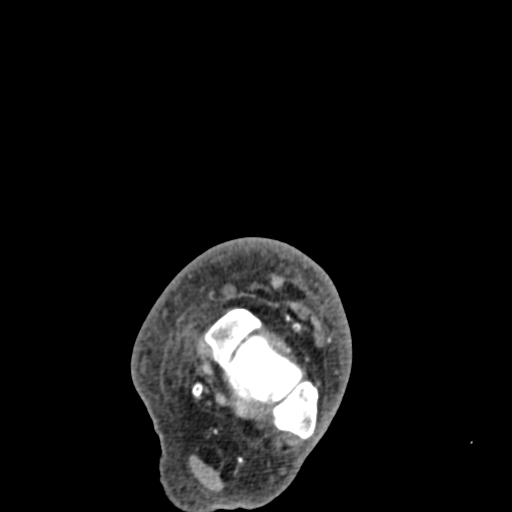
[im 86/512  bone]
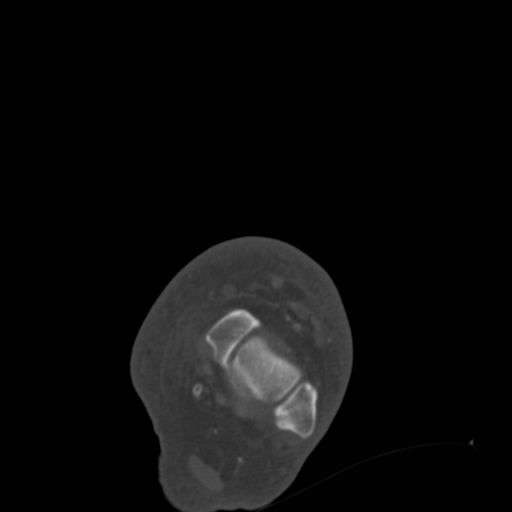
[im 171/512  bone]
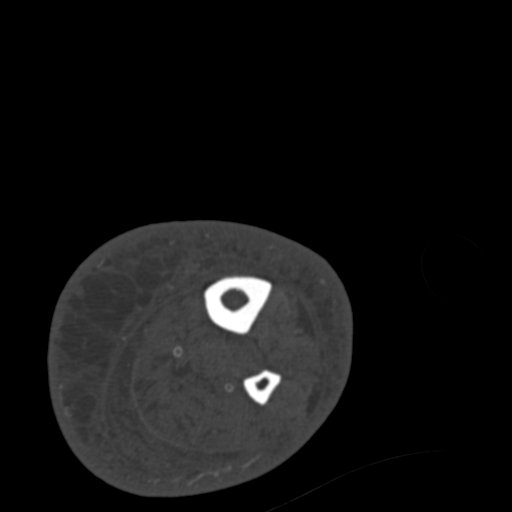
[im 256/512  bone]
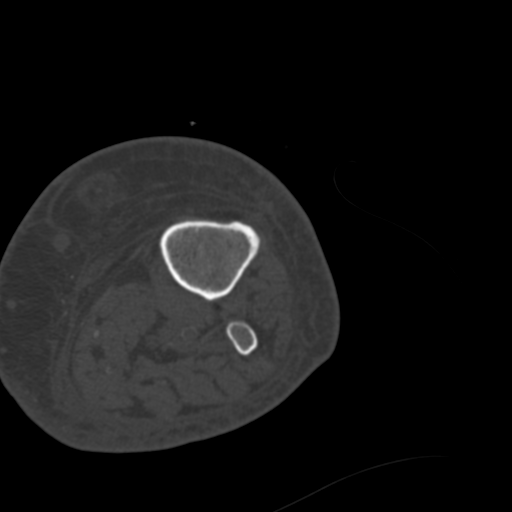
[im 341/512  bone]
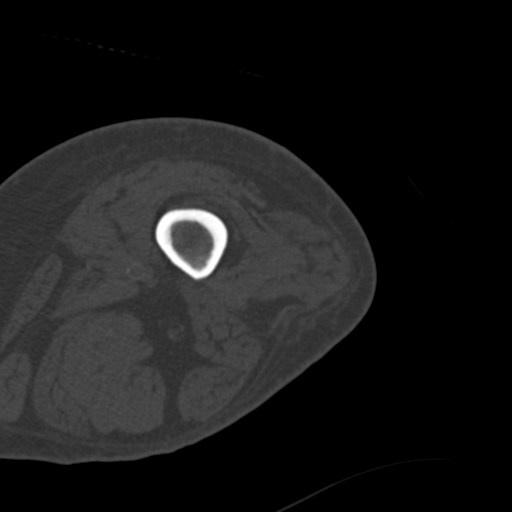
[im 426/512  soft-tissue]
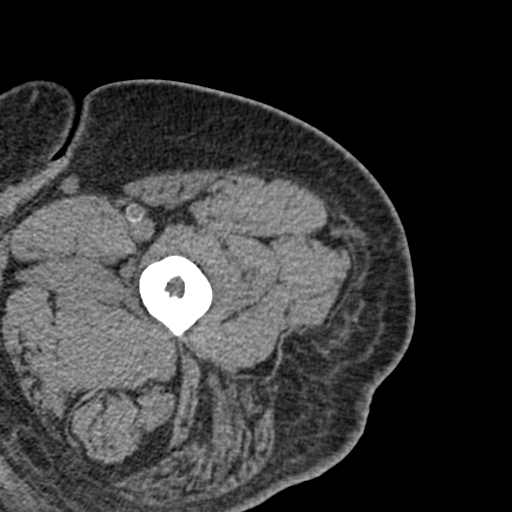
[im 426/512  bone]
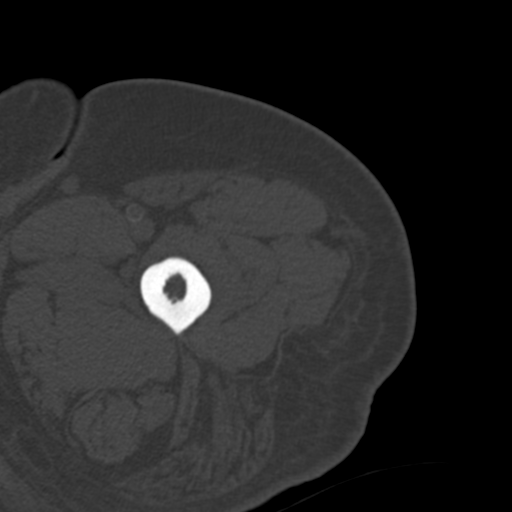

[Series 14: cor axial st left · coronal · 0.50mm/px · 3 of 121 slices shown]
[im 25/121  bone]
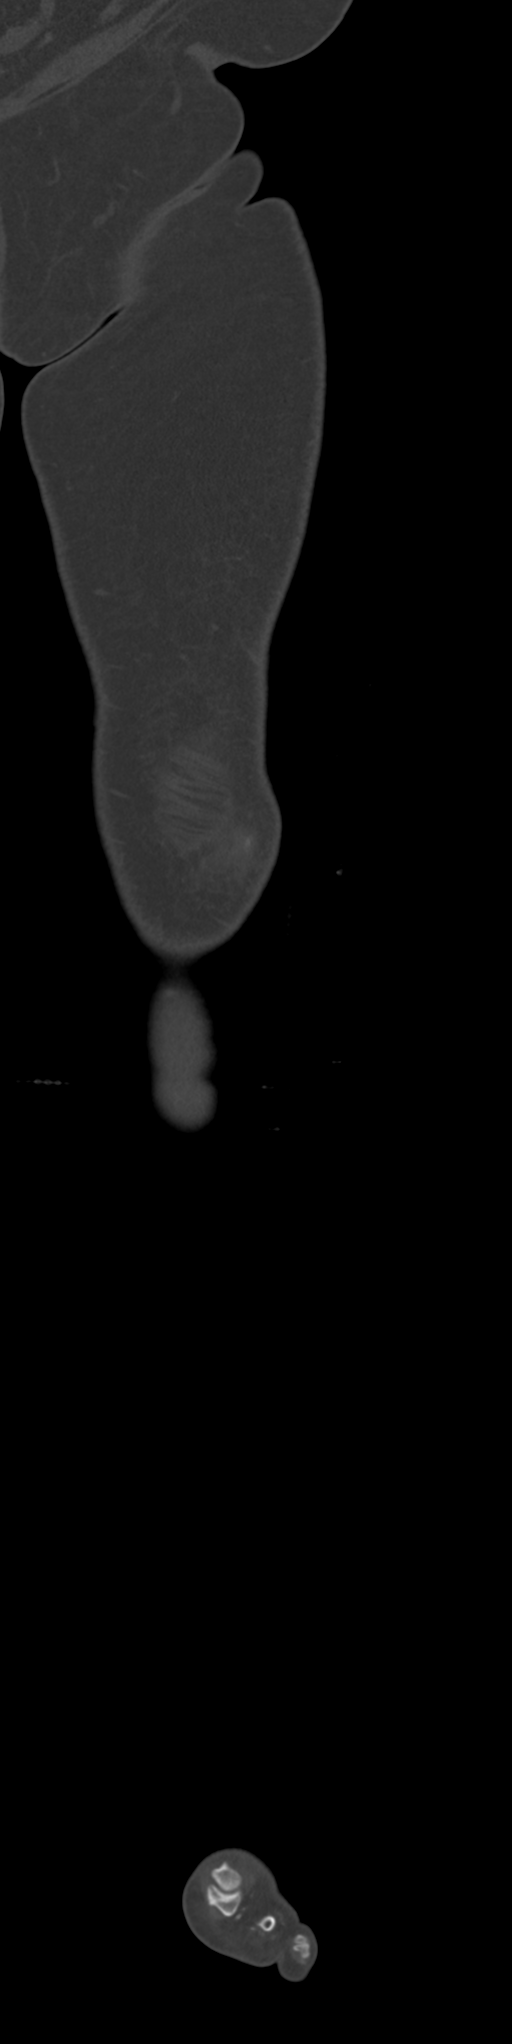
[im 49/121  bone]
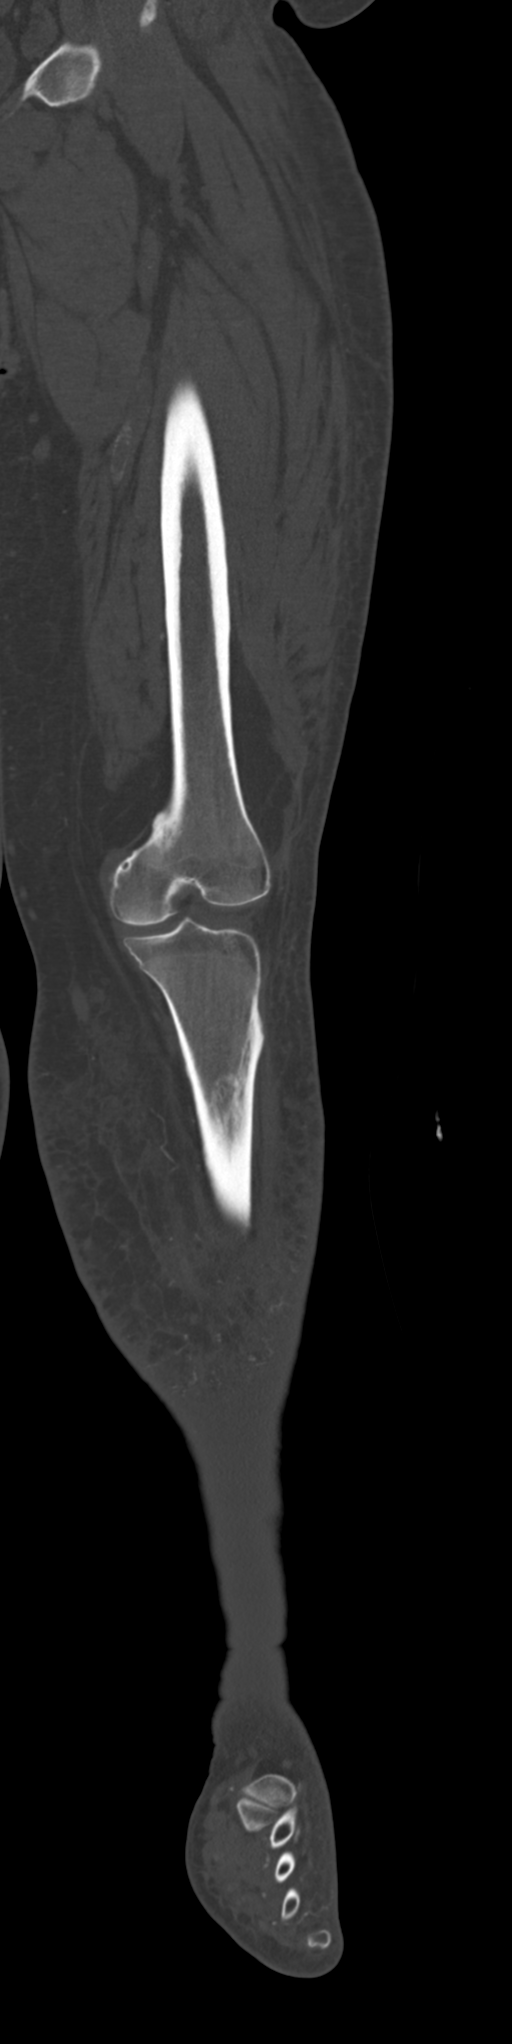
[im 73/121  bone]
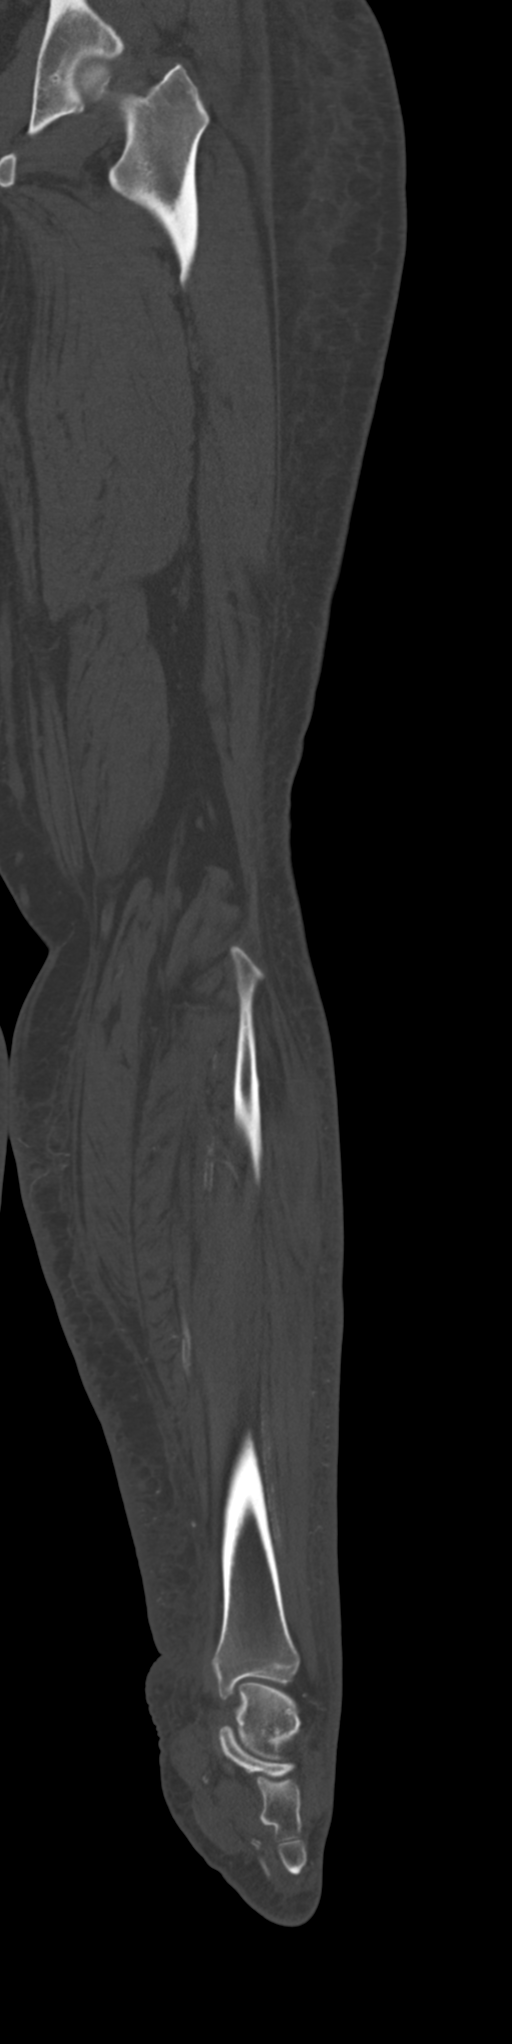

[8 of 33 positions shown; findings below may reference images not displayed]

FINDINGS: Bones/Joint/Cartilage

No acute fracture. No malalignment. Joint spaces are maintained.
Small bidirectional calcaneal enthesophytes. No cortical destruction
or periostitis. No focal bone lesions. No knee joint effusion. No
discernible hip joint effusion. No tibiotalar joint effusion.

Ligaments

Suboptimally assessed by CT.

Muscles and Tendons

Preserved muscle bulk. No CT evidence of intramuscular edema. No
intramuscular fluid collections. Tendons about the hip, knee, and
ankle including the Achilles tendon appear intact. No appreciable
tenosynovial fluid collection.

Soft tissues

There is circumferential subcutaneous edema most pronounced from the
level of the proximal calf through the ankle. A thin amount of fluid
tracks along the superficial investing fascia overlying the calf
musculature. No deep fascial fluid collections. No well-defined or
drainable fluid collections within the soft tissues. No soft tissue
gas. No radiopaque foreign body. Extensive vascular calcifications
throughout the lower extremity. Multiple nonenlarged left inguinal
lymph nodes.
IMPRESSION: 1. Nonspecific circumferential soft tissue edema, most pronounced at
the level of the proximal left calf through the ankle. Findings can
be seen in fluid overload settings as well as cellulitis in the
appropriate clinical setting. No deep fascial fluid. No well-defined
or drainable fluid collections. No soft tissue gas.
2. No fracture or evidence of osteomyelitis.
3. No visible joint effusion at the left hip, knee, or ankle.

## 2019-07-04 MED ORDER — SODIUM THIOSULFATE 25 % IV SOLN
25.0000 g | Freq: Every day | INTRAVENOUS | Status: DC
  Administered 2019-07-04 – 2019-07-08 (×4): 25 g via INTRAVENOUS
  Filled 2019-07-04 (×6): qty 100

## 2019-07-04 NOTE — Progress Notes (Signed)
   07/04/19 0930  Vital Signs  Pulse Rate (!) 110  Resp (!) 27  BP (!) 141/104  Oxygen Therapy  SpO2 100 %  Pain Assessment  Pain Scale 0-10  Pain Score 8  Pain Type Acute pain  Pain Location Leg  Pain Orientation Right;Left  During Hemodialysis Assessment  Blood Flow Rate (mL/min) 400 mL/min  Arterial Pressure (mmHg) -140 mmHg  Venous Pressure (mmHg) 130 mmHg  Transmembrane Pressure (mmHg) 10 mmHg  Ultrafiltration Rate (mL/min) 1140 mL/min  Dialysate Flow Rate (mL/min) 0 ml/min  Conductivity: Machine  14.2  HD Safety Checks Performed Yes  Intra-Hemodialysis Comments Progressing as prescribed

## 2019-07-04 NOTE — Progress Notes (Signed)
   07/04/19 1205  Neurological  Level of Consciousness Alert  Orientation Level Oriented X4  Respiratory  Respiratory Pattern Regular  Cardiac  Pulse Regular  ECG Monitor Yes  pt tolerated tx well pain management well ufg 4078ml via cvc no issues remained in bed for tx

## 2019-07-04 NOTE — Progress Notes (Signed)
Established hemodialysis patient known at [facility] TTS 6:45, patient transports self. Please contact me directly with any dialysis placement concerns.   Elvera Bicker Dialysis Coordinator [phone]

## 2019-07-04 NOTE — Progress Notes (Addendum)
   07/04/19 0815  Neurological  Level of Consciousness Alert  Orientation Level Oriented X4  Respiratory  Respiratory Pattern Regular  Cardiac  Pulse Regular  ECG Monitor Yes  Pt to treatment via bed VS stable c/o leg pain no distress noted. CVC WDL UFG 4052ml UF tx only sodium thiosulfate to be given during tx.

## 2019-07-04 NOTE — Consult Note (Addendum)
Subjective:   CC: Bilateral lower extremity cellulitis  HPI:  [name] is a 33 y.o. female who was consulted by [facility] for evaluation of above.  First noted a few days ago.  Symptoms include: Pain is sharp, worsening, localized to the bilateral lower extremities.  Skin is very sensitive and tight.  Exacerbated by touch.  Alleviated by nothing specific.  Associated with increased swelling in bilateral lower extremities extending from the knee downwards.  Swelling was gradually increasing along with the pain for the past few days.  Patient cannot recall any history of trauma to either legs she otherwise feels okay.  Denies any issues with moving her legs or feet.   Past Medical History:  has a past medical history of Anemia of chronic disease, Asthma, CHF (congestive heart failure) ([place]), CKD (chronic kidney disease), stage III ([place]), Diabetes ([place]), Hypertension, and Morbid obesity ([place]).  Past Surgical History:  has a past surgical history that includes Tonsillectomy and DIALYSIS/PERMA CATHETER INSERTION (N/A, 02/28/2019).  Family History: family history includes Cancer in her maternal grandfather and maternal grandmother; Hypertension in her mother; Multiple sclerosis in her mother.  Social History:  reports that she has never smoked. She has never used smokeless tobacco. She reports that she does not drink alcohol or use drugs.  Current Medications:  Medications Prior to Admission  Medication Sig Dispense Refill  . amLODipine (NORVASC) 10 MG tablet Take 10 mg by mouth daily.    Marland Kitchen aspirin EC 81 MG EC tablet Take 1 tablet (81 mg total) by mouth daily. 30 tablet 2  . calcium acetate (PHOSLO) 667 MG capsule Take 1,334 mg by mouth 3 (three) times daily with meals.    . carvedilol (COREG) 25 MG tablet Take 1 tablet (25 mg total) by mouth 2 (two) times daily with a meal. 60 tablet 5  . cloNIDine (CATAPRES) 0.1 MG tablet Take 0.1 mg by mouth 3 (three) times daily.    . hydrOXYzine  (ATARAX/VISTARIL) 25 MG tablet Take 25 mg by mouth 2 (two) times daily as needed for itching.    . isosorbide mononitrate (IMDUR) 30 MG 24 hr tablet Take 1 tablet (30 mg total) by mouth 2 (two) times daily. 60 tablet 5  . losartan (COZAAR) 100 MG tablet Take 100 mg by mouth every evening.     . torsemide (DEMADEX) 20 MG tablet Take 40 mg by mouth daily.       Allergies:  No Known Allergies  ROS:  General: Denies weight loss, weight gain, fatigue, fevers, chills, and night sweats. Eyes: Denies blurry vision, double vision, eye pain, itchy eyes, and tearing. Ears: Denies hearing loss, earache, and ringing in ears. Nose: Denies sinus pain, congestion, infections, runny nose, and nosebleeds. Mouth/throat: Denies hoarseness, sore throat, bleeding gums, and difficulty swallowing. Heart: Denies chest pain, palpitations, racing heart, irregular heartbeat, leg pain or swelling, and decreased activity tolerance. Respiratory: Denies breathing difficulty, shortness of breath, wheezing, cough, and sputum. GI: Denies change in appetite, heartburn, nausea, vomiting, constipation, diarrhea, and blood in stool. GU: Denies difficulty urinating, pain with urinating, urgency, frequency, blood in urine. Musculoskeletal: Denies joint stiffness, pain, swelling, muscle weakness. Skin: Denies rash, itching, mass, tumors, Neurologic: Denies headache, fainting, dizziness, seizures, numbness, and tingling. Psychiatric: Denies depression, anxiety, difficulty sleeping, and memory loss. Endocrine: Denies heat or cold intolerance, and increased thirst or urination. Blood/lymph: Denies easy bruising, easy bruising, and swollen glands     Objective:     BP 122/73 (BP Location: Left Arm)  Pulse (!) 110   Temp 98.7 F (37.1 C) (Oral)   Resp 20   Ht 5\' 6"  (1.676 m)   Wt 129.3 kg   LMP 06/05/2019   SpO2 99%   BMI 46.00 kg/m   Constitutional :  alert, cooperative, appears stated age and no distress   Lymphatics/Throat:  no asymmetry, masses, or scars  Respiratory:  clear to auscultation bilaterally  Cardiovascular:  regular rate and rhythm  Gastrointestinal: soft, non-tender; bowel sounds normal; no masses,  no organomegaly.  Musculoskeletal: Steady gait and movement  Skin: Cool and moist.  Bilateral lower extremity swelling with increased erythema more pronounced on the left consistent with cellulitis.  Tender to slight palpation mostly in the anterior portion, but no tenderness in the posterior aspect or the calf muscles are located.  The swelling is also missing from this area.  The swelling is focused in the anterior aspect and starts slightly distal to the knee extends down towards the ankle, not involving the feet.  Sensation and motor are intact bilaterally in the foot and ankle region, with no pain with full dorsiflexion and plantar flexion either passively or with active motion.  Doppler noted pulses in bilateral DP and PT.  Minimal serous drainage noted on the bedsheets and around the area of increased bruising in the posterior aspect.  No evidence of active bleeding or expanding hematoma at this time.  Psychiatric: Normal affect, non-agitated, not confused       LABS:  CMP Latest Ref Rng & Units 07/04/2019 07/03/2019 07/02/2019  Glucose 70 - 99 mg/dL 106(H) 96 104(H)  BUN 6 - 20 mg/dL 55(H) 73(H) 69(H)  Creatinine 0.44 - 1.00 mg/dL 10.53(H) 13.43(H) 12.98(H)  Sodium 135 - 145 mmol/L 138 138 139  Potassium 3.5 - 5.1 mmol/L 4.4 4.5 4.6  Chloride 98 - 111 mmol/L 100 103 100  CO2 22 - 32 mmol/L 23 20(L) 21(L)  Calcium 8.9 - 10.3 mg/dL 8.0(L) 7.7(L) 8.3(L)  Total Protein 6.5 - 8.1 g/dL - - -  Total Bilirubin 0.3 - 1.2 mg/dL - - -  Alkaline Phos 38 - 126 U/L - - -  AST 15 - 41 U/L - - -  ALT 14 - 54 U/L - - -   CBC Latest Ref Rng & Units 07/04/2019 07/03/2019 07/02/2019  WBC 4.0 - 10.5 K/uL 16.8(H) 21.4(H) 24.1(H)  Hemoglobin 12.0 - 15.0 g/dL 7.3(L) 7.5(L) 8.8(L)  Hematocrit 36.0 -  46.0 % 23.5(L) 24.7(L) 28.3(L)  Platelets 150 - 400 K/uL 264 296 327    RADS: Pending CT  Assessment:     Bilateral lower extremity cellulitis. Concern for compartment syndrome  Plan:      Tenderness likely due to the edema in the subcutaneous level, with no obvious evidence of fascial involvement on clinical exam.  CT images pending to see if any drainable fluid collection is found    Recommendation is to aggressively monitor her fluid status to minimize additional edema, along with continuing IV antibiotics for the cellulitis of unknown origin.  No obvious source of the infection at this point.  Continue to monitor closely with serial neurovascular checks and labs.  NPO for now, in case she needs to go to OR for urgent fasciotomy.  Initial notification of consultation at 10:40 AM, with any initial exam completed by 11 AM.

## 2019-07-04 NOTE — Progress Notes (Signed)
[facility] [place] NAME: [name]    MR#:  [id number]  DATE OF BIRTH:  [birth year]  SUBJECTIVE:  CHIEF COMPLAINT:   Chief Complaint  Patient presents with  . Leg Swelling  . Bleeding/Bruising   Patient seen during hemodialysis.  Still had significant swelling and tenderness to both lower extremity.  General surgery consult placed due to lower extremity being very firm and tight to evaluate for possibility of compartment syndrome.  CT bilateral lower extremity without contrast requested.  REVIEW OF SYSTEMS:  ROS  Constitutional:  Negative for fever, malaise/fatigue and weight loss.  HENT: Negative for congestion, hearing loss and sore throat.   Eyes: Negative for blurred vision and double vision.  Respiratory: Negative for cough, shortness of breath and wheezing.   Cardiovascular: Positive for leg swelling and pain. Negative for chest pain, palpitations and orthopnea.  Gastrointestinal: Negative for abdominal pain, diarrhea, nausea and vomiting.  Genitourinary: Negative for dysuria and urgency.  Musculoskeletal: Negative for myalgias.       Leg pain  Skin: Negative for rash.  Neurological: Negative for dizziness, sensory change, speech change, focal weakness and headaches.  Psychiatric/Behavioral: Negative for depression.   DRUG ALLERGIES:  No Known Allergies VITALS:  Blood pressure 122/73, pulse (!) 110, temperature 98.7 F (37.1 C), temperature source Oral, resp. rate 20, height 5' 6"  (1.676 m), weight 129.3 kg, last menstrual period 06/05/2019, SpO2 99 %. PHYSICAL EXAMINATION:  Physical Exam  GENERAL:  33 y.o.-year-old patient lying in the bed with no acute distress.  EYES: Pupils equal, round, reactive to light and accommodation. No scleral icterus. Extraocular muscles intact.  HEENT: Head atraumatic, normocephalic. Oropharynx and nasopharynx clear.  NECK:  Supple, no jugular venous distention. No thyroid enlargement, no  tenderness.  LUNGS: Normal breath sounds bilaterally, no wheezing, rales,rhonchi or crepitation. No use of accessory muscles of respiration.  CARDIOVASCULAR: S1, S2 normal. No murmurs, rubs, or gallops.  ABDOMEN: Soft, nontender, nondistended. Bowel sounds present. No organomegaly or mass.  EXTREMITIES: Noted significant swelling and redness with tenderness to my palpation to both lower extremity.  Feels very firm to touch.  Concern for developing compartment syndrome.  MUSCULOSKELETAL: Normal bulk, and power was 5+ grip and elbow, knee, and ankle flexion and extension bilaterally.  NEUROLOGIC:Alert and oriented x 3. CN 2-12 intact. Gait not tested due to safety concern. PSYCHIATRIC: The patient is alert and oriented x 3.  SKIN:  Areas of redness on both lower extremity between the knee joint extending towards the ankle joint.  Areas of healing blisters on both lower extremity.  LABORATORY PANEL:  Female CBC Recent Labs  Lab 07/04/19 0416  WBC 16.8*  HGB 7.3*  HCT 23.5*  PLT 264   ------------------------------------------------------------------------------------------------------------------ Chemistries  Recent Labs  Lab 07/03/19 0417 07/04/19 0416  NA 138 138  K 4.5 4.4  CL 103 100  CO2 20* 23  GLUCOSE 96 106*  BUN 73* 55*  CREATININE 13.43* 10.53*  CALCIUM 7.7* 8.0*  MG 1.7  --    RADIOLOGY:  No results found. ASSESSMENT AND PLAN:   33 y.o. female with past medical history of ESR D on HD Tuesdays, Thursdays and Saturdays, hypertension, diabetes mellitus, CHF, anemia, and asthma presenting to the ED with chief complaints of bilateral leg swelling and pain.  1. Sepsis -secondary to bilateral lower extremity cellulitis Patient on empiric antibiotics with IV vancomycin, cefepime and Flagyl Follow-up on cultures.  Gentle IV fluid hydration.  Based on  exam today, clinical concern for developing compartment syndrome as such surgery consult was placed.  Patient seen by  surgeon Dr. Lysle Pearl who recommended CT scanning of both lower extremity for further evaluation.  Leukocytosis improving. Ultrasound done with no evidence of DVT.  2.  ESRD on HD Tuesdays, Thursdays and Saturday -BUN/creatinine elevated above baseline likely due to missing dialysis  Patient having hemodialysis done this morning.  Nephrology following.  3.  Acute on chronic systolicCongestive Heart Failure: Bilateral lower extremity edema Last Echo 01/2016 withEF 50-55% - Chest x-ray shows vascular congestion likely due to volume overload - Continue Demadex - Continue Imdur and Coreg -Continue inpatient hemodialysis. Low salt diet - Check daily weight - Strict I&Os  4.HTN- stable Blood pressure controlled on current regimen  5. Diabetes Mellitus Type 2 with complications Glycosylated hemoglobin level less than 4.2.  Sliding scale insulin coverage  6. Anemia of CKD: Hemoglobin of 7.3 this morning.  Transfuse if hemoglobin less than 7.    DVT prophylaxis - hold Heparin this afternoon in case patient requires emergency surgery for compartment syndrome Patient cannot tolerate SCDs to both lower extremity due to severe pains   All the records are reviewed and case discussed with Care Management/Social Worker. Management plans discussed with the patient, family and they are in agreement. Hold on updated patient's mom; Ms. Beyonka Pitney [phone]).  She was updated on treatment plans as outlined above.  All questions were answered.  CODE STATUS: Full Code  TOTAL TIME TAKING CARE OF THIS PATIENT: 38 minutes.   More than 50% of the time was spent in counseling/coordination of care: YES  POSSIBLE D/C IN 3 DAYS, DEPENDING ON CLINICAL CONDITION.   Jesselle Laflamme M.D on 07/04/2019 at 12:53 PM  Between 7am to 6pm - Pager - [phone]  After 6pm go to [email or web address] - Proofreader  [facility]  Office  [phone]  CC: Primary care  physician; [facility]  Note: This dictation was prepared with Dragon dictation along with smaller phrase technology. Any transcriptional errors that result from this process are unintentional.

## 2019-07-04 NOTE — Progress Notes (Signed)
[facility], [place] 07/04/19  Subjective:   LOS: 2 09/16 0701 - 09/17 0700 In: 861 [P.O.:480; I.V.:77.5; IV Piggyback:303.5] Out: [id number]  Patient presented to ER for worsening le edema and painful ulcers Seen during HD   HEMODIALYSIS FLOWSHEET:  Blood Flow Rate (mL/min): 400 mL/min Arterial Pressure (mmHg): -160 mmHg Venous Pressure (mmHg): 100 mmHg Transmembrane Pressure (mmHg): 40 mmHg Ultrafiltration Rate (mL/min): 840 mL/min Dialysate Flow Rate (mL/min): 600 ml/min Conductivity: Machine : 14 Conductivity: Machine : 14 Dialysis Fluid Bolus: Normal Saline Bolus Amount (mL): 250 mL  Continues to c/o pain in the legs 4300 cc removed with HD yesterday Tolerated sodium thiosulfate yesterday   Objective:  Vital signs in last 24 hours:  Temp:  [98.5 F (36.9 C)-99.5 F (37.5 C)] 98.8 F (37.1 C) (09/17 0521) Pulse Rate:  [63-118] 115 (09/17 0845) Resp:  [11-44] 21 (09/17 0845) BP: (126-170)/(78-142) 170/142 (09/17 0845) SpO2:  [93 %-100 %] 100 % (09/17 0845)  Weight change:  Filed Weights   07/02/19 1940  Weight: 129.3 kg    Intake/Output:    Intake/Output Summary (Last 24 hours) at 07/04/2019 0855 Last data filed at 07/04/2019 0542 Gross per 24 hour  Intake 860.96 ml  Output 4298 ml  Net -3437.04 ml    Gen:   Alert, cooperative, no distress, appears stated age Head:   Normocephalic, without obvious abnormality, atraumatic Eyes/ENT:  conjunctiva/corneas clear,  moist oral mucus membranes Neck:  Supple,  thyroid: not enlarged, no JVD Lungs:   Clear to auscultation bilaterally, respirations unlabored, O2 by [place] Heart:   Regular rate and rhythm, S1, S2 normal, no murmur, rub or gallop Abdomen:   Soft, non-tender  Extremities: no cyanosis; 2-3+  Edema;  Skin:  Skin color, texture, turgor normal, ulcerative lesions at back of calf- rt Neurologic: Alert and oriented, able to answer questions appropriately       Basic Metabolic  Panel:  Recent Labs  Lab 07/02/19 1947 07/03/19 0417 07/04/19 0416  NA 139 138 138  K 4.6 4.5 4.4  CL 100 103 100  CO2 21* 20* 23  GLUCOSE 104* 96 106*  BUN 69* 73* 55*  CREATININE 12.98* 13.43* 10.53*  CALCIUM 8.3* 7.7* 8.0*  MG  --  1.7  --   PHOS  --   --  7.8*     CBC: Recent Labs  Lab 07/02/19 1947 07/03/19 0417 07/04/19 0416  WBC 24.1* 21.4* 16.8*  HGB 8.8* 7.5* 7.3*  HCT 28.3* 24.7* 23.5*  MCV 88.7 90.1 88.0  PLT 327 296 264      Lab Results  Component Value Date   HEPBSAG Negative 02/11/2016   HEPBSAB Non Reactive 02/11/2016      Microbiology:  Recent Results (from the past 240 hour(s))  Culture, blood (Routine x 2)     Status: None (Preliminary result)   Collection Time: 07/02/19  8:08 PM   Specimen: BLOOD  Result Value Ref Range Status   Specimen Description BLOOD LEFT ANTECUBITAL  Final   Special Requests   Final    BOTTLES DRAWN AEROBIC AND ANAEROBIC Blood Culture results may not be optimal due to an excessive volume of blood received in culture bottles   Culture   Final    NO GROWTH 2 DAYS Performed at [facility], [address] [place], [place] [address]    Report Status PENDING  Incomplete  Culture, blood (Routine x 2)     Status: None (Preliminary result)   Collection Time:  07/02/19  8:08 PM   Specimen: BLOOD  Result Value Ref Range Status   Specimen Description BLOOD BLOOD LEFT HAND  Final   Special Requests   Final    BOTTLES DRAWN AEROBIC AND ANAEROBIC Blood Culture adequate volume   Culture   Final    NO GROWTH 2 DAYS Performed at [facility], [place], [place] [address]    Report Status PENDING  Incomplete  SARS CORONAVIRUS 2 (TAT 6-24 HRS) Nasopharyngeal Nasopharyngeal Swab     Status: None   Collection Time: 07/02/19 10:48 PM   Specimen: Nasopharyngeal Swab  Result Value Ref Range Status   SARS Coronavirus 2 NEGATIVE NEGATIVE Final    Comment: (NOTE) SARS-CoV-2 target nucleic acids  are NOT DETECTED. The SARS-CoV-2 RNA is generally detectable in upper and lower respiratory specimens during the acute phase of infection. Negative results do not preclude SARS-CoV-2 infection, do not rule out co-infections with other pathogens, and should not be used as the sole basis for treatment or other patient management decisions. Negative results must be combined with clinical observations, patient history, and epidemiological information. The expected result is Negative. Fact Sheet for Patients: SugarRoll.be Fact Sheet for Healthcare Providers: https://www.woods-mathews.com/ This test is not yet approved or cleared by the [place] FDA and  has been authorized for detection and/or diagnosis of SARS-CoV-2 by FDA under an Emergency Use Authorization (EUA). This EUA will remain  in effect (meaning this test can be used) for the duration of the COVID-19 declaration under Section 56 4(b)(1) of the Act, 21 U.S.C. section 360bbb-3(b)(1), unless the authorization is terminated or revoked sooner. Performed at [facility], [place] [address] [place], [place] [address]     Coagulation Studies: Recent Labs    07/02/19 Jan 21, 2007  LABPROT 14.4  INR 1.1    Urinalysis: No results for input(s): COLORURINE, LABSPEC, PHURINE, GLUCOSEU, HGBUR, BILIRUBINUR, KETONESUR, PROTEINUR, UROBILINOGEN, NITRITE, LEUKOCYTESUR in the last 72 hours.  Invalid input(s): APPERANCEUR    Imaging: Dg Tibia/fibula Right  Result Date: 07/02/2019 CLINICAL DATA:  Right lower leg swelling and bruising. No known injury. EXAM: RIGHT TIBIA AND FIBULA - 2 VIEW COMPARISON:  None. FINDINGS: There is no evidence of fracture or other focal bone lesions. Soft tissues are unremarkable. IMPRESSION: Negative exam. Electronically Signed   By: Inge Rise M.D.   On: 07/02/2019 20:44   US Venous Img Lower Bilateral  Result Date: 07/03/2019 CLINICAL DATA:  Bilateral leg  swelling for 1 month EXAM: BILATERAL LOWER EXTREMITY VENOUS DOPPLER ULTRASOUND TECHNIQUE: Gray-scale sonography with graded compression, as well as color Doppler and duplex ultrasound were performed to evaluate the lower extremity deep venous systems from the level of the common femoral vein and including the common femoral, femoral, profunda femoral, popliteal and calf veins including the posterior tibial, peroneal and gastrocnemius veins when visible. The superficial great saphenous vein was also interrogated. Spectral Doppler was utilized to evaluate flow at rest and with distal augmentation maneuvers in the common femoral, femoral and popliteal veins. COMPARISON:  None. FINDINGS: RIGHT LOWER EXTREMITY Common Femoral Vein: No evidence of thrombus. Normal compressibility, respiratory phasicity and response to augmentation. Saphenofemoral Junction: No evidence of thrombus. Normal compressibility and flow on color Doppler imaging. Profunda Femoral Vein: No evidence of thrombus. Normal compressibility and flow on color Doppler imaging. Femoral Vein: No evidence of thrombus. Normal compressibility, respiratory phasicity and response to augmentation. Popliteal Vein: No evidence of thrombus. Normal compressibility, respiratory phasicity and response to augmentation. Calf Veins:  Calf veins are difficult to visualize due to underlying edema. Superficial Great Saphenous Vein: No evidence of thrombus. Normal compressibility. Venous Reflux:  None. Other Findings:  None. LEFT LOWER EXTREMITY Common Femoral Vein: No evidence of thrombus. Normal compressibility, respiratory phasicity and response to augmentation. Saphenofemoral Junction: No evidence of thrombus. Normal compressibility and flow on color Doppler imaging. Profunda Femoral Vein: No evidence of thrombus. Normal compressibility and flow on color Doppler imaging. Femoral Vein: No evidence of thrombus. Normal compressibility, respiratory phasicity and response to  augmentation. Popliteal Vein: No evidence of thrombus. Normal compressibility, respiratory phasicity and response to augmentation. Calf Veins: Calf veins are difficult to visualize due to underlying edema Superficial Great Saphenous Vein: No evidence of thrombus. Normal compressibility. Venous Reflux:  None. Other Findings:  None. IMPRESSION: No central deep venous thrombosis is noted. The calf veins are difficult to visualize. Electronically Signed   By: Inez Catalina M.D.   On: 07/03/2019 00:04   Dg Chest Portable 1 View  Result Date: 07/02/2019 CLINICAL DATA:  Peripheral edema EXAM: PORTABLE CHEST 1 VIEW COMPARISON:  07/05/2017 FINDINGS: Cardiac shadow is mildly prominent. Dialysis catheter is again seen. Vascular congestion is noted likely related to volume overload. No focal infiltrate or effusion is seen. IMPRESSION: Vascular congestion likely related to volume overload. Electronically Signed   By: Inez Catalina M.D.   On: 07/02/2019 20:50     Medications:   . sodium chloride Stopped (07/04/19 0539)  . ceFEPime (MAXIPIME) IV    . metronidazole 100 mL/hr at 07/04/19 0542  . sodium thiosulfate infusion for calciphylaxis    . vancomycin     . amLODipine  10 mg Oral Daily  . aspirin EC  81 mg Oral Daily  . calcium acetate  1,334 mg Oral TID WC  . carvedilol  25 mg Oral BID WC  . Chlorhexidine Gluconate Cloth  6 each Topical Q0600  . cloNIDine  0.1 mg Oral TID  . epoetin (EPOGEN/PROCRIT) injection  10,000 Units Intravenous Q T,Th,Sa-HD  . heparin  5,000 Units Subcutaneous Q8H  . insulin aspart  0-5 Units Subcutaneous QHS  . insulin aspart  0-9 Units Subcutaneous TID WC  . isosorbide mononitrate  30 mg Oral BID  . torsemide  40 mg Oral Daily   sodium chloride, diphenhydrAMINE, HYDROmorphone (DILAUDID) injection, hydrOXYzine, labetalol, oxyCODONE-acetaminophen  Assessment/ Plan:  33 y.o.African [place] female with ESTD, HTN, DM, CHF, Anemia, Asthma, Chronic edema  Active Problems:    Sepsis due to cellulitis [facility])  [facility]/ [employer] TTS  #. ESRD Recent Labs    07/02/19 1947 07/03/19 0417 07/04/19 0416  CREATININE 12.98* 13.43* 10.53*  HD today for volume removal UF only treatment today with goal ~ 4 liters Next treatment tomorrow Continue treatment with Sodium thiosulfate empirically  #. Anemia of CKD  Lab Results  Component Value Date   HGB 7.3 (L) 07/04/2019  EPO with HD on TTS  #. SHPTH     Component Value Date/Time   PTH 217 (H) 02/11/2016 1317   Lab Results  Component Value Date   PHOS 7.8 (H) 07/04/2019   Monitor phos  #. HTN   BP control expected to improve with volume removal  #. Diabetes type 2 with CKD Hgb A1c MFr Bld (%)  Date Value  02/11/2016 5.9   # Leg ulcers with chronic leg edema Differential includes venous ulceration vs calciphylaxis continue sodium thiosulfate empirically    LOS: 2 [name] Candiss Norse 9/17/20208:[address], [place]  [phone]  

## 2019-07-04 NOTE — Progress Notes (Signed)
Subjective:  CC: [name] is a 33 y.o. female  Hospital stay day 2,   BLE cellulitis, concern for compartment syndrome  HPI: No significance change since last exam this am.  States pain is under control, able to ambulate with some pain.  CT completed without any issues.  ROS:  General: Denies weight loss, weight gain, fatigue, fevers, chills, and night sweats. Heart: Denies chest pain, palpitations, racing heart, irregular heartbeat, leg pain or swelling, and decreased activity tolerance. Respiratory: Denies breathing difficulty, shortness of breath, wheezing, cough, and sputum. GI: Denies change in appetite, heartburn, nausea, vomiting, constipation, diarrhea, and blood in stool. GU: Denies difficulty urinating, pain with urinating, urgency, frequency, blood in urine.   Objective:   Temp:  [98.6 F (37 C)-99.6 F (37.6 C)] 98.7 F (37.1 C) (09/17 1247) Pulse Rate:  [63-121] 110 (09/17 1247) Resp:  [10-44] 20 (09/17 1247) BP: (122-170)/(73-142) 122/73 (09/17 1247) SpO2:  [93 %-100 %] 99 % (09/17 1247)     Height: 5\' 6"  (167.6 cm) Weight: 129.3 kg BMI (Calculated): 46.02   Intake/Output this shift:   Intake/Output Summary (Last 24 hours) at 07/04/2019 1556 Last data filed at 07/04/2019 1205 Gross per 24 hour  Intake 860.96 ml  Output 7798 ml  Net -6937.04 ml    Constitutional :  alert, cooperative, appears stated age and no distress  Respiratory:  clear to auscultation bilaterally  Cardiovascular:  regular rate and rhythm  Gastrointestinal: Cool and moist.  Bilateral lower extremity swelling with increased erythema more pronounced on the left consistent with cellulitis.  Tender to slight palpation mostly in the anterior portion, but no tenderness in the posterior aspect or the calf muscles are located.  The swelling is also missing from this area.  The swelling is focused in the anterior aspect and starts slightly distal to the knee extends down towards the ankle, not  involving the feet.  Sensation and motor are intact bilaterally in the foot and ankle region, with no pain with full dorsiflexion and plantar flexion either passively or with active motion.  Doppler noted pulses in bilateral DP and PT.  Minimal serous drainage noted on the bedsheets and around the area of increased bruising in the posterior aspect.  No evidence of active bleeding or expanding hematoma at this time..   Skin: Cool and moist.   Psychiatric: Normal affect, non-agitated, not confused       LABS:  CMP Latest Ref Rng & Units 07/04/2019 07/03/2019 07/02/2019  Glucose 70 - 99 mg/dL 106(H) 96 104(H)  BUN 6 - 20 mg/dL 55(H) 73(H) 69(H)  Creatinine 0.44 - 1.00 mg/dL 10.53(H) 13.43(H) 12.98(H)  Sodium 135 - 145 mmol/L 138 138 139  Potassium 3.5 - 5.1 mmol/L 4.4 4.5 4.6  Chloride 98 - 111 mmol/L 100 103 100  CO2 22 - 32 mmol/L 23 20(L) 21(L)  Calcium 8.9 - 10.3 mg/dL 8.0(L) 7.7(L) 8.3(L)  Total Protein 6.5 - 8.1 g/dL - - -  Total Bilirubin 0.3 - 1.2 mg/dL - - -  Alkaline Phos 38 - 126 U/L - - -  AST 15 - 41 U/L - - -  ALT 14 - 54 U/L - - -   CBC Latest Ref Rng & Units 07/04/2019 07/03/2019 07/02/2019  WBC 4.0 - 10.5 K/uL 16.8(H) 21.4(H) 24.1(H)  Hemoglobin 12.0 - 15.0 g/dL 7.3(L) 7.5(L) 8.8(L)  Hematocrit 36.0 - 46.0 % 23.5(L) 24.7(L) 28.3(L)  Platelets 150 - 400 K/uL 264 296 327    RADS: CLINICAL DATA:  Left lower extremity pain and swelling  EXAM: CT OF THE LOWER LEFT EXTREMITY WITHOUT CONTRAST  TECHNIQUE: Multidetector CT imaging of the lower left extremity was performed according to the standard protocol.  COMPARISON:  None.  FINDINGS: Bones/Joint/Cartilage  No acute fracture. No malalignment. Joint spaces are maintained. Small bidirectional calcaneal enthesophytes. No cortical destruction or periostitis. No focal bone lesions. No knee joint effusion. No discernible hip joint effusion. No tibiotalar joint effusion.  Ligaments  Suboptimally assessed by  CT.  Muscles and Tendons  Preserved muscle bulk. No CT evidence of intramuscular edema. No intramuscular fluid collections. Tendons about the hip, knee, and ankle including the Achilles tendon appear intact. No appreciable tenosynovial fluid collection.  Soft tissues  There is circumferential subcutaneous edema most pronounced from the level of the proximal calf through the ankle. A thin amount of fluid tracks along the superficial investing fascia overlying the calf musculature. No deep fascial fluid collections. No well-defined or drainable fluid collections within the soft tissues. No soft tissue gas. No radiopaque foreign body. Extensive vascular calcifications throughout the lower extremity. Multiple nonenlarged left inguinal lymph nodes.  IMPRESSION: 1. Nonspecific circumferential soft tissue edema, most pronounced at the level of the proximal left calf through the ankle. Findings can be seen in fluid overload settings as well as cellulitis in the appropriate clinical setting. No deep fascial fluid. No well-defined or drainable fluid collections. No soft tissue gas. 2. No fracture or evidence of osteomyelitis. 3. No visible joint effusion at the left hip, knee, or ankle.   Electronically Signed   By: Davina Poke M.D.   On: 07/04/2019 14:27  CLINICAL DATA:  Right lower extremity pain and swelling  EXAM: CT OF THE LOWER RIGHT EXTREMITY WITHOUT CONTRAST  TECHNIQUE: Multidetector CT imaging of the right lower extremity was performed according to the standard protocol.  COMPARISON:  Right tibia-fibula x-ray 07/02/2019  FINDINGS: Bones/Joint/Cartilage  No acute fracture. No malalignment. Joint spaces are maintained. Small bidirectional calcaneal enthesophytes. No cortical destruction or periostitis. No focal bone lesions. No knee joint effusion. No discernible hip joint effusion. No tibiotalar joint effusion.  Ligaments  Suboptimally assessed  by CT.  Muscles and Tendons  Preserved muscle bulk. No CT evidence of intramuscular edema. No intramuscular fluid collections. Tendons about the hip, knee, and ankle including the Achilles tendon appear intact. No appreciable tenosynovial fluid collection.  Soft tissues  There is circumferential subcutaneous edema most pronounced from the level of the mid calf through the ankle. A thin amount of fluid tracks along the superficial investing fascia overlying the calf musculature. No deep fascial fluid collections. No well-defined or drainable fluid collections within the soft tissues. No soft tissue gas. No radiopaque foreign body. Extensive vascular calcifications throughout the lower extremity. Multiple nonenlarged right inguinal lymph nodes. Peritoneal drainage catheter partially visualized in the deep pelvis.  IMPRESSION: 1. Nonspecific circumferential soft tissue edema, most pronounced at the level of the mid right calf through the ankle. Findings can be seen in fluid overload settings as well as cellulitis in the appropriate clinical setting. No deep fascial fluid. No well-defined or drainable fluid collections. No soft tissue gas. 2. No fracture or evidence of osteomyelitis. 3. No visible joint effusion at the right hip, knee, or ankle.   Electronically Signed   By: Davina Poke M.D.   On: 07/04/2019 14:32  Assessment:   BLE cellulitis,   Tenderness likely due to the edema in the subcutaneous level, with no obvious evidence of fascial involvement on  clinical exam.  CT also reassuring with no fascial fluid or obvious gas concerning for necrotizing fasciitis.  With the pain otherwise controlled at this point and no continuous pain when there is no palpation of the overlying skin, I believe most of the swelling and the cause of her discomfort is only at the subcutaneous level and not involving the deeper fascial structures or muscles that warrants a fasciotomy  at this point.  The physical exam with lack of pain with range of motion is also very reassuring along with the intact neurovascular exam over the past several hours.    Repeat physical exam performed at 1315 was essentially unchanged from exam reported on initial consultation earlier this am.  Continue IV abx and neurovascular checks for now.  Ok to resume renal diet.  Further care for her CKD and HD per hospitalist and nephrologist.  This encounter lasted >30min, of which majority was dedicated to direct patient care and discussion

## 2019-07-04 NOTE — Progress Notes (Signed)
This note also relates to the following rows which could not be included: Pulse Rate - Cannot attach notes to unvalidated device data Resp - Cannot attach notes to unvalidated device data SpO2 - Cannot attach notes to unvalidated device data   

## 2019-07-05 LAB — PHOSPHORUS: Phosphorus: 8.6 mg/dL — ABNORMAL HIGH (ref 2.5–4.6)

## 2019-07-05 LAB — GLUCOSE, CAPILLARY
Glucose-Capillary: 82 mg/dL (ref 70–99)
Glucose-Capillary: 123 mg/dL — ABNORMAL HIGH (ref 70–99)

## 2019-07-05 LAB — HEMOGLOBIN A1C
Hgb A1c MFr Bld: 5.1 % (ref 4.8–5.6)
Mean Plasma Glucose: 100 mg/dL

## 2019-07-05 LAB — CBC
HCT: 20.1 % — ABNORMAL LOW (ref 36.0–46.0)
Hemoglobin: 6.5 g/dL — ABNORMAL LOW (ref 12.0–15.0)
MCH: 27.4 pg (ref 26.0–34.0)
MCHC: 32.3 g/dL (ref 30.0–36.0)
MCV: 84.8 fL (ref 80.0–100.0)
Platelets: 266 10*3/uL (ref 150–400)
RBC: 2.37 MIL/uL — ABNORMAL LOW (ref 3.87–5.11)
RDW: 17.2 % — ABNORMAL HIGH (ref 11.5–15.5)
WBC: 16.6 10*3/uL — ABNORMAL HIGH (ref 4.0–10.5)
nRBC: 0 % (ref 0.0–0.2)

## 2019-07-05 LAB — BASIC METABOLIC PANEL
Anion gap: 21 — ABNORMAL HIGH (ref 5–15)
BUN: 65 mg/dL — ABNORMAL HIGH (ref 6–20)
CO2: 19 mmol/L — ABNORMAL LOW (ref 22–32)
Calcium: 8.1 mg/dL — ABNORMAL LOW (ref 8.9–10.3)
Chloride: 98 mmol/L (ref 98–111)
Creatinine, Ser: 12.22 mg/dL — ABNORMAL HIGH (ref 0.44–1.00)
GFR calc Af Amer: 4 mL/min — ABNORMAL LOW (ref >60)
GFR calc non Af Amer: 4 mL/min — ABNORMAL LOW (ref >60)
Glucose, Bld: 97 mg/dL (ref 70–99)
Potassium: 4.6 mmol/L (ref 3.5–5.1)
Sodium: 138 mmol/L (ref 135–145)

## 2019-07-05 LAB — HEMOGLOBIN: Hemoglobin: 5.7 g/dL — ABNORMAL LOW (ref 12.0–15.0)

## 2019-07-05 LAB — HEMOGLOBIN AND HEMATOCRIT, BLOOD
HCT: 23.5 % — ABNORMAL LOW (ref 36.0–46.0)
Hemoglobin: 7.9 g/dL — ABNORMAL LOW (ref 12.0–15.0)

## 2019-07-05 LAB — MAGNESIUM: Magnesium: 1.7 mg/dL (ref 1.7–2.4)

## 2019-07-05 LAB — PREPARE RBC (CROSSMATCH)

## 2019-07-05 MED ORDER — VANCOMYCIN HCL IN DEXTROSE 1-5 GM/200ML-% IV SOLN
1000.0000 mg | Freq: Once | INTRAVENOUS | Status: AC
  Administered 2019-07-05: 22:00:00 1000 mg via INTRAVENOUS
  Filled 2019-07-05: qty 200

## 2019-07-05 MED ORDER — SODIUM CHLORIDE 0.9% IV SOLUTION
Freq: Once | INTRAVENOUS | Status: AC
  Administered 2019-07-05: 14:00:00 via INTRAVENOUS

## 2019-07-05 MED ORDER — SODIUM CHLORIDE 0.9 % IV SOLN
2.0000 g | Freq: Once | INTRAVENOUS | Status: AC
  Administered 2019-07-06: 2 g via INTRAVENOUS
  Filled 2019-07-05: qty 2

## 2019-07-05 MED ORDER — ACETAMINOPHEN 325 MG PO TABS
650.0000 mg | ORAL_TABLET | Freq: Three times a day (TID) | ORAL | Status: DC | PRN
  Administered 2019-07-05: 650 mg via ORAL
  Filled 2019-07-05: qty 2

## 2019-07-05 MED ORDER — ONDANSETRON HCL 4 MG/2ML IJ SOLN
4.0000 mg | Freq: Four times a day (QID) | INTRAMUSCULAR | Status: DC | PRN
  Administered 2019-07-05 – 2019-07-08 (×3): 4 mg via INTRAVENOUS
  Filled 2019-07-05 (×4): qty 2

## 2019-07-05 NOTE — Progress Notes (Signed)
Pre HD:    07/05/19 0920  Vital Signs  Temp 98.9 F (37.2 C)  Temp Source Oral  Pulse Rate 96  Pulse Rate Source Dinamap  Resp (!) 23  BP 116/76  BP Location Left Arm  BP Method Automatic  Patient Position (if appropriate) Lying  Oxygen Therapy  SpO2 98 %  O2 Device Room Air  Pain Assessment  Pain Scale 0-10  Pain Score 0  Dialysis Weight  Weight 129.3 kg  Type of Weight Pre-Dialysis  Time-Out for Hemodialysis  What Procedure? HD   Pt Identifiers(min of two) First/Last Name;MRN/Account#;Pt's DOB(use if MRN/Acct# not available  Correct Site? Yes  Correct Side? Yes  Correct Procedure? Yes  Consents Verified? Yes  Rad Studies Available? N/A  Safety Precautions Reviewed? Yes  Engineer, civil (consulting) Number 4  Station Number 2  UF/Alarm Test Passed  Conductivity: Meter 13.8  Conductivity: Machine  14.2  pH 7.4  Reverse Osmosis main  Normal Saline Lot Number [id number]  Dialyzer Lot Number [id number]  Disposable Set Lot Number [id number]  Machine Temperature 98.6 F (37 C)  Musician and Audible Yes  Blood Lines Intact and Secured Yes  Pre Treatment Patient Checks  Vascular access used during treatment Catheter  HD catheter dressing before treatment WDL  Patient is receiving dialysis in a chair  (no)  Hepatitis B Surface Antigen Results Negative  Date Hepatitis B Surface Antigen Drawn 06/25/19  Isolation Initiated  (no)  Hepatitis B Surface Antibody  (<10)  Date Hepatitis B Surface Antibody Drawn 03/07/19  Hemodialysis Consent Verified Yes  Hemodialysis Standing Orders Initiated Yes  ECG (Telemetry) Monitor On Yes  Prime Ordered Normal Saline  Length of  DialysisTreatment -hour(s) 3.5 Hour(s)  Dialysis Treatment Comments  (Na 140)  Dialyzer Elisio 17H NR  Dialysate 2K;2.5 Ca  Dialysate Flow Ordered 600  Blood Flow Rate Ordered 300 mL/min  Ultrafiltration Goal 3.5 Liters  Dialysis Blood Pressure Support Ordered Normal Saline  Education / Care Plan   Dialysis Education Provided Yes  Documented Education in Care Plan Yes  Outpatient Plan of Care Reviewed and on Chart Yes  Hemodialysis Catheter Right Internal jugular Double-lumen  Placement Date: 02/28/19   Time Out: Correct patient;Correct site;Correct procedure  Maximum sterile barrier precautions: Hand hygiene;Cap;Mask;Large sterile sheet;Sterile gloves;Sterile gown  Site Prep: Chlorhexidine  Local Anesthetic: Injectable - 1...  Site Condition No complications  Blue Lumen Status Blood return noted  Red Lumen Status Blood return noted  Purple Lumen Status N/A  Dressing Type Biopatch;Occlusive  Dressing Status Clean;Dry;Intact  Drainage Description None

## 2019-07-05 NOTE — Progress Notes (Signed)
[facility], [place] 07/05/19  Subjective:   LOS: 3 09/17 0701 - 09/18 0700 In: [id number] [P.O.:120; I.V.:50.4; IV Piggyback:596.6] Out: 3500  Patient presented to ER for worsening le edema and painful ulcers Seen during HD C/o pain 6/10 PD catheter being flushed   HEMODIALYSIS FLOWSHEET:  Blood Flow Rate (mL/min): (P) 400 mL/min Arterial Pressure (mmHg): -200 mmHg Venous Pressure (mmHg): 160 mmHg Transmembrane Pressure (mmHg): 50 mmHg Ultrafiltration Rate (mL/min): 1140 mL/min Dialysate Flow Rate (mL/min): 600 ml/min Conductivity: Machine : 14.3 Conductivity: Machine : 14.3 Dialysis Fluid Bolus: Normal Saline Bolus Amount (mL): 250 mL  So far 4300+3500 cc removed with HD last 2 days   Objective:  Vital signs in last 24 hours:  Temp:  [98.5 F (36.9 C)-99.9 F (37.7 C)] 98.9 F (37.2 C) (09/18 0926) Pulse Rate:  [95-110] 105 (09/18 1215) Resp:  [14-27] 25 (09/18 1215) BP: (103-133)/(68-82) 130/82 (09/18 1215) SpO2:  [95 %-100 %] 100 % (09/18 1215) Weight:  [129.3 kg] 129.3 kg (09/18 0926)  Weight change:  Filed Weights   07/02/19 1940 07/05/19 0920 07/05/19 0926  Weight: 129.3 kg 129.3 kg 129.3 kg    Intake/Output:    Intake/Output Summary (Last 24 hours) at 07/05/2019 1245 Last data filed at 07/04/2019 1858 Gross per 24 hour  Intake 767.01 ml  Output --  Net 767.01 ml    Gen:   Alert, cooperative, no distress, appears stated age Head:   Normocephalic, without obvious abnormality, atraumatic Eyes/ENT:  conjunctiva/corneas clear,  moist oral mucus membranes Neck:  Supple,  thyroid: not enlarged, no JVD Lungs:   Clear to auscultation bilaterally, respirations unlabored, O2 by [place] Heart:   Regular rate and rhythm, S1, S2 normal, no murmur, rub or gallop Abdomen:   Soft, non-tender  Extremities: no cyanosis; 2-3+  Edema;  Skin:  Skin color, texture, turgor normal, ulcerative lesions at back of calf- rt Neurologic: Alert and oriented,  able to answer questions appropriately       Basic Metabolic Panel:  Recent Labs  Lab 07/02/19 1947 07/03/19 0417 07/04/19 0416 07/05/19 0351  NA 139 138 138 138  K 4.6 4.5 4.4 4.6  CL 100 103 100 98  CO2 21* 20* 23 19*  GLUCOSE 104* 96 106* 97  BUN 69* 73* 55* 65*  CREATININE 12.98* 13.43* 10.53* 12.22*  CALCIUM 8.3* 7.7* 8.0* 8.1*  MG  --  1.7  --  1.7  PHOS  --   --  7.8* 8.6*     CBC: Recent Labs  Lab 07/02/19 1947 07/03/19 0417 07/04/19 0416 07/05/19 0351  WBC 24.1* 21.4* 16.8* 16.6*  HGB 8.8* 7.5* 7.3* 6.5*  HCT 28.3* 24.7* 23.5* 20.1*  MCV 88.7 90.1 88.0 84.8  PLT 327 296 264 266      Lab Results  Component Value Date   HEPBSAG Negative 02/11/2016   HEPBSAB Non Reactive 02/11/2016      Microbiology:  Recent Results (from the past 240 hour(s))  Culture, blood (Routine x 2)     Status: None (Preliminary result)   Collection Time: 07/02/19  8:08 PM   Specimen: BLOOD  Result Value Ref Range Status   Specimen Description BLOOD LEFT ANTECUBITAL  Final   Special Requests   Final    BOTTLES DRAWN AEROBIC AND ANAEROBIC Blood Culture results may not be optimal due to an excessive volume of blood received in culture bottles   Culture   Final    NO GROWTH 3 DAYS Performed at [facility], [address] [place], [place] [address]    Report Status PENDING  Incomplete  Culture, blood (Routine x 2)     Status: None (Preliminary result)   Collection Time: 07/02/19  8:08 PM   Specimen: BLOOD  Result Value Ref Range Status   Specimen Description BLOOD BLOOD LEFT HAND  Final   Special Requests   Final    BOTTLES DRAWN AEROBIC AND ANAEROBIC Blood Culture adequate volume   Culture   Final    NO GROWTH 3 DAYS Performed at [facility], [place], [place] [address]    Report Status PENDING  Incomplete  SARS CORONAVIRUS 2 (TAT 6-24 HRS) Nasopharyngeal Nasopharyngeal Swab     Status: None   Collection Time: 07/02/19 10:48  PM   Specimen: Nasopharyngeal Swab  Result Value Ref Range Status   SARS Coronavirus 2 NEGATIVE NEGATIVE Final    Comment: (NOTE) SARS-CoV-2 target nucleic acids are NOT DETECTED. The SARS-CoV-2 RNA is generally detectable in upper and lower respiratory specimens during the acute phase of infection. Negative results do not preclude SARS-CoV-2 infection, do not rule out co-infections with other pathogens, and should not be used as the sole basis for treatment or other patient management decisions. Negative results must be combined with clinical observations, patient history, and epidemiological information. The expected result is Negative. Fact Sheet for Patients: SugarRoll.be Fact Sheet for Healthcare Providers: https://www.woods-mathews.com/ This test is not yet approved or cleared by the [place] FDA and  has been authorized for detection and/or diagnosis of SARS-CoV-2 by FDA under an Emergency Use Authorization (EUA). This EUA will remain  in effect (meaning this test can be used) for the duration of the COVID-19 declaration under Section 56 4(b)(1) of the Act, 21 U.S.C. section 360bbb-3(b)(1), unless the authorization is terminated or revoked sooner. Performed at [facility], [place] [address] [place], [place] [address]     Coagulation Studies: Recent Labs    07/02/19 01-25-2007  LABPROT 14.4  INR 1.1    Urinalysis: No results for input(s): COLORURINE, LABSPEC, PHURINE, GLUCOSEU, HGBUR, BILIRUBINUR, KETONESUR, PROTEINUR, UROBILINOGEN, NITRITE, LEUKOCYTESUR in the last 72 hours.  Invalid input(s): APPERANCEUR    Imaging: Ct Femur Left Wo Contrast  Result Date: 07/04/2019 CLINICAL DATA:  Left lower extremity pain and swelling EXAM: CT OF THE LOWER LEFT EXTREMITY WITHOUT CONTRAST TECHNIQUE: Multidetector CT imaging of the lower left extremity was performed according to the standard protocol. COMPARISON:  None. FINDINGS:  Bones/Joint/Cartilage No acute fracture. No malalignment. Joint spaces are maintained. Small bidirectional calcaneal enthesophytes. No cortical destruction or periostitis. No focal bone lesions. No knee joint effusion. No discernible hip joint effusion. No tibiotalar joint effusion. Ligaments Suboptimally assessed by CT. Muscles and Tendons Preserved muscle bulk. No CT evidence of intramuscular edema. No intramuscular fluid collections. Tendons about the hip, knee, and ankle including the Achilles tendon appear intact. No appreciable tenosynovial fluid collection. Soft tissues There is circumferential subcutaneous edema most pronounced from the level of the proximal calf through the ankle. A thin amount of fluid tracks along the superficial investing fascia overlying the calf musculature. No deep fascial fluid collections. No well-defined or drainable fluid collections within the soft tissues. No soft tissue gas. No radiopaque foreign body. Extensive vascular calcifications throughout the lower extremity. Multiple nonenlarged left inguinal lymph nodes. IMPRESSION: 1. Nonspecific circumferential soft tissue edema, most pronounced at the level of the proximal left calf through the ankle. Findings can be seen in fluid overload settings  as well as cellulitis in the appropriate clinical setting. No deep fascial fluid. No well-defined or drainable fluid collections. No soft tissue gas. 2. No fracture or evidence of osteomyelitis. 3. No visible joint effusion at the left hip, knee, or ankle. Electronically Signed   By: Davina Poke M.D.   On: 07/04/2019 14:27   Ct Femur Right Wo Contrast  Result Date: 07/04/2019 CLINICAL DATA:  Right lower extremity pain and swelling EXAM: CT OF THE LOWER RIGHT EXTREMITY WITHOUT CONTRAST TECHNIQUE: Multidetector CT imaging of the right lower extremity was performed according to the standard protocol. COMPARISON:  Right tibia-fibula x-ray 07/02/2019 FINDINGS: Bones/Joint/Cartilage  No acute fracture. No malalignment. Joint spaces are maintained. Small bidirectional calcaneal enthesophytes. No cortical destruction or periostitis. No focal bone lesions. No knee joint effusion. No discernible hip joint effusion. No tibiotalar joint effusion. Ligaments Suboptimally assessed by CT. Muscles and Tendons Preserved muscle bulk. No CT evidence of intramuscular edema. No intramuscular fluid collections. Tendons about the hip, knee, and ankle including the Achilles tendon appear intact. No appreciable tenosynovial fluid collection. Soft tissues There is circumferential subcutaneous edema most pronounced from the level of the mid calf through the ankle. A thin amount of fluid tracks along the superficial investing fascia overlying the calf musculature. No deep fascial fluid collections. No well-defined or drainable fluid collections within the soft tissues. No soft tissue gas. No radiopaque foreign body. Extensive vascular calcifications throughout the lower extremity. Multiple nonenlarged right inguinal lymph nodes. Peritoneal drainage catheter partially visualized in the deep pelvis. IMPRESSION: 1. Nonspecific circumferential soft tissue edema, most pronounced at the level of the mid right calf through the ankle. Findings can be seen in fluid overload settings as well as cellulitis in the appropriate clinical setting. No deep fascial fluid. No well-defined or drainable fluid collections. No soft tissue gas. 2. No fracture or evidence of osteomyelitis. 3. No visible joint effusion at the right hip, knee, or ankle. Electronically Signed   By: Davina Poke M.D.   On: 07/04/2019 14:32   Ct Tibia Fibula Left Wo Contrast  Result Date: 07/04/2019 CLINICAL DATA:  Left lower extremity pain and swelling EXAM: CT OF THE LOWER LEFT EXTREMITY WITHOUT CONTRAST TECHNIQUE: Multidetector CT imaging of the lower left extremity was performed according to the standard protocol. COMPARISON:  None. FINDINGS:  Bones/Joint/Cartilage No acute fracture. No malalignment. Joint spaces are maintained. Small bidirectional calcaneal enthesophytes. No cortical destruction or periostitis. No focal bone lesions. No knee joint effusion. No discernible hip joint effusion. No tibiotalar joint effusion. Ligaments Suboptimally assessed by CT. Muscles and Tendons Preserved muscle bulk. No CT evidence of intramuscular edema. No intramuscular fluid collections. Tendons about the hip, knee, and ankle including the Achilles tendon appear intact. No appreciable tenosynovial fluid collection. Soft tissues There is circumferential subcutaneous edema most pronounced from the level of the proximal calf through the ankle. A thin amount of fluid tracks along the superficial investing fascia overlying the calf musculature. No deep fascial fluid collections. No well-defined or drainable fluid collections within the soft tissues. No soft tissue gas. No radiopaque foreign body. Extensive vascular calcifications throughout the lower extremity. Multiple nonenlarged left inguinal lymph nodes. IMPRESSION: 1. Nonspecific circumferential soft tissue edema, most pronounced at the level of the proximal left calf through the ankle. Findings can be seen in fluid overload settings as well as cellulitis in the appropriate clinical setting. No deep fascial fluid. No well-defined or drainable fluid collections. No soft tissue gas. 2. No fracture or evidence of  osteomyelitis. 3. No visible joint effusion at the left hip, knee, or ankle. Electronically Signed   By: Davina Poke M.D.   On: 07/04/2019 14:27   Ct Tibia Fibula Right Wo Contrast  Result Date: 07/04/2019 CLINICAL DATA:  Right lower extremity pain and swelling EXAM: CT OF THE LOWER RIGHT EXTREMITY WITHOUT CONTRAST TECHNIQUE: Multidetector CT imaging of the right lower extremity was performed according to the standard protocol. COMPARISON:  Right tibia-fibula x-ray 07/02/2019 FINDINGS:  Bones/Joint/Cartilage No acute fracture. No malalignment. Joint spaces are maintained. Small bidirectional calcaneal enthesophytes. No cortical destruction or periostitis. No focal bone lesions. No knee joint effusion. No discernible hip joint effusion. No tibiotalar joint effusion. Ligaments Suboptimally assessed by CT. Muscles and Tendons Preserved muscle bulk. No CT evidence of intramuscular edema. No intramuscular fluid collections. Tendons about the hip, knee, and ankle including the Achilles tendon appear intact. No appreciable tenosynovial fluid collection. Soft tissues There is circumferential subcutaneous edema most pronounced from the level of the mid calf through the ankle. A thin amount of fluid tracks along the superficial investing fascia overlying the calf musculature. No deep fascial fluid collections. No well-defined or drainable fluid collections within the soft tissues. No soft tissue gas. No radiopaque foreign body. Extensive vascular calcifications throughout the lower extremity. Multiple nonenlarged right inguinal lymph nodes. Peritoneal drainage catheter partially visualized in the deep pelvis. IMPRESSION: 1. Nonspecific circumferential soft tissue edema, most pronounced at the level of the mid right calf through the ankle. Findings can be seen in fluid overload settings as well as cellulitis in the appropriate clinical setting. No deep fascial fluid. No well-defined or drainable fluid collections. No soft tissue gas. 2. No fracture or evidence of osteomyelitis. 3. No visible joint effusion at the right hip, knee, or ankle. Electronically Signed   By: Davina Poke M.D.   On: 07/04/2019 14:32   Ct Foot Left Wo Contrast  Result Date: 07/04/2019 CLINICAL DATA:  Left lower extremity pain and swelling EXAM: CT OF THE LOWER LEFT EXTREMITY WITHOUT CONTRAST TECHNIQUE: Multidetector CT imaging of the lower left extremity was performed according to the standard protocol. COMPARISON:  None.  FINDINGS: Bones/Joint/Cartilage No acute fracture. No malalignment. Joint spaces are maintained. Small bidirectional calcaneal enthesophytes. No cortical destruction or periostitis. No focal bone lesions. No knee joint effusion. No discernible hip joint effusion. No tibiotalar joint effusion. Ligaments Suboptimally assessed by CT. Muscles and Tendons Preserved muscle bulk. No CT evidence of intramuscular edema. No intramuscular fluid collections. Tendons about the hip, knee, and ankle including the Achilles tendon appear intact. No appreciable tenosynovial fluid collection. Soft tissues There is circumferential subcutaneous edema most pronounced from the level of the proximal calf through the ankle. A thin amount of fluid tracks along the superficial investing fascia overlying the calf musculature. No deep fascial fluid collections. No well-defined or drainable fluid collections within the soft tissues. No soft tissue gas. No radiopaque foreign body. Extensive vascular calcifications throughout the lower extremity. Multiple nonenlarged left inguinal lymph nodes. IMPRESSION: 1. Nonspecific circumferential soft tissue edema, most pronounced at the level of the proximal left calf through the ankle. Findings can be seen in fluid overload settings as well as cellulitis in the appropriate clinical setting. No deep fascial fluid. No well-defined or drainable fluid collections. No soft tissue gas. 2. No fracture or evidence of osteomyelitis. 3. No visible joint effusion at the left hip, knee, or ankle. Electronically Signed   By: Davina Poke M.D.   On: 07/04/2019 14:27  Ct Foot Right Wo Contrast  Result Date: 07/04/2019 CLINICAL DATA:  Right lower extremity pain and swelling EXAM: CT OF THE LOWER RIGHT EXTREMITY WITHOUT CONTRAST TECHNIQUE: Multidetector CT imaging of the right lower extremity was performed according to the standard protocol. COMPARISON:  Right tibia-fibula x-ray 07/02/2019 FINDINGS:  Bones/Joint/Cartilage No acute fracture. No malalignment. Joint spaces are maintained. Small bidirectional calcaneal enthesophytes. No cortical destruction or periostitis. No focal bone lesions. No knee joint effusion. No discernible hip joint effusion. No tibiotalar joint effusion. Ligaments Suboptimally assessed by CT. Muscles and Tendons Preserved muscle bulk. No CT evidence of intramuscular edema. No intramuscular fluid collections. Tendons about the hip, knee, and ankle including the Achilles tendon appear intact. No appreciable tenosynovial fluid collection. Soft tissues There is circumferential subcutaneous edema most pronounced from the level of the mid calf through the ankle. A thin amount of fluid tracks along the superficial investing fascia overlying the calf musculature. No deep fascial fluid collections. No well-defined or drainable fluid collections within the soft tissues. No soft tissue gas. No radiopaque foreign body. Extensive vascular calcifications throughout the lower extremity. Multiple nonenlarged right inguinal lymph nodes. Peritoneal drainage catheter partially visualized in the deep pelvis. IMPRESSION: 1. Nonspecific circumferential soft tissue edema, most pronounced at the level of the mid right calf through the ankle. Findings can be seen in fluid overload settings as well as cellulitis in the appropriate clinical setting. No deep fascial fluid. No well-defined or drainable fluid collections. No soft tissue gas. 2. No fracture or evidence of osteomyelitis. 3. No visible joint effusion at the right hip, knee, or ankle. Electronically Signed   By: Davina Poke M.D.   On: 07/04/2019 14:32     Medications:    sodium chloride 30 mL (07/04/19 1830)   ceFEPime (MAXIPIME) IV Stopped (07/04/19 1353)   ceFEPime (MAXIPIME) IV     metronidazole 500 mg (07/04/19 2155)   sodium thiosulfate infusion for calciphylaxis Stopped (07/04/19 1014)   vancomycin Stopped (07/04/19 1555)    vancomycin      sodium chloride   Intravenous Once   amLODipine  10 mg Oral Daily   aspirin EC  81 mg Oral Daily   calcium acetate  1,334 mg Oral TID WC   carvedilol  25 mg Oral BID WC   Chlorhexidine Gluconate Cloth  6 each Topical Q0600   cloNIDine  0.1 mg Oral TID   epoetin (EPOGEN/PROCRIT) injection  10,000 Units Intravenous Q T,Th,Sa-HD   isosorbide mononitrate  30 mg Oral BID   torsemide  40 mg Oral Daily   sodium chloride, diphenhydrAMINE, HYDROmorphone (DILAUDID) injection, hydrOXYzine, labetalol, oxyCODONE-acetaminophen  Assessment/ Plan:  33 y.o.[place] female with ESTD, HTN, DM, CHF, Anemia, Asthma, Chronic edema  Active Problems:   Sepsis due to cellulitis [facility])  [facility]/ [employer] TTS  #. ESRD with volume overload Recent Labs    07/02/19 1947 07/03/19 0417 07/04/19 0416 07/05/19 0351  CREATININE 12.98* 13.43* 10.53* 12.22*  HD today for volume removal UF only treatment again tomorrow Continue treatment with Sodium thiosulfate empirically  #. Anemia of CKD  Lab Results  Component Value Date   HGB 6.5 (L) 07/05/2019  EPO with HD on TTS Will need to be monitored closely  #. SHPTH     Component Value Date/Time   PTH 217 (H) 02/11/2016 1317   Lab Results  Component Value Date   PHOS 8.6 (H) 07/05/2019   Monitor phos  #. HTN   BP control expected to improve with volume  removal  #. 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

## 2019-07-05 NOTE — Progress Notes (Signed)
Post HD Assessment:     07/05/19 0920  Neurological  Level of Consciousness Alert  Orientation Level Oriented X4  Respiratory  Respiratory Pattern Regular;Unlabored  Cardiac  Pulse Regular  Heart Sounds S1, S2  Jugular Venous Distention (JVD) No  ECG Monitor Yes  Vascular  R Radial Pulse +2  L Radial Pulse +2  Psychosocial  Psychosocial (WDL) WDL

## 2019-07-05 NOTE — Progress Notes (Signed)
Pharmacy Antibiotic Note  [name] is a 33 y.o. female admitted on 07/02/2019 with sepsis due to cellulitis  Pharmacy has been consulted for Vancomycin and Cefepime dosing.  Her normal HD schedule is Tuesdays, Thursdays and Saturdays. This is day #4 of IV antibiotics, leukocytosis is improved, although still elevated, no recent fevers. Dr Stark Jock plans to continue the current antibiotics for another 1-2 days over concerns for compartment syndrome and continued tenderness  Plan: 1) continue vancomycin 1000 mg TThSa with HD   Nephrology is performing an additional HD session today  obtain a pre-HD level prior to the 3rd HD session, which is 9/19  Goal pre-HD level 15-25 mcg/mL  2) continue cefepime 2 grams TThSa with HD (extra dose today after HD)   Height: 5\' 6"  (167.6 cm) Weight: 285 lb 0.9 oz (129.3 kg) IBW/kg (Calculated) : 59.3  Temp (24hrs), Avg:98.9 F (37.2 C), Min:98.5 F (36.9 C), Max:99.9 F (37.7 C)  Recent Labs  Lab 07/02/19 1947 07/02/19 2008 07/02/19 2224 07/03/19 0417 07/04/19 0416 07/05/19 0351  WBC 24.1*  --   --  21.4* 16.8* 16.6*  CREATININE 12.98*  --   --  13.43* 10.53* 12.22*  LATICACIDVEN  --  1.2 1.1  --   --   --     Estimated Creatinine Clearance: 8.9 mL/min (A) (by C-G formula based on SCr of 12.22 mg/dL (H)).    No Known Allergies  Antimicrobials this admission: Cefepime 9/15 >>  Vancomycin 9/15 >>  Flagyl 500mg  9/15 >>  Microbiology results:  9/15 BCx: NGTD  9/15 SARS-CoV-2: negative    9/16 MRSA PCR: pending   Thank you for allowing pharmacy to be a part of this patient's care.  Dallie Piles, PharmD 07/05/2019 12:08 PM

## 2019-07-05 NOTE — Progress Notes (Signed)
Pt had an episode of nausea/emisis. Primary nurse paged and spoke to Dr. Leslye Peer. Orders received for zofran.

## 2019-07-05 NOTE — Progress Notes (Signed)
HD Tx Completed:     07/05/19 1255  Vital Signs  Temp 98.5 F (36.9 C)  Temp Source Oral  Pulse Rate (!) 103  Pulse Rate Source Dinamap  Resp (!) 21  BP 123/80  BP Location Left Arm  BP Method Automatic  Patient Position (if appropriate) Lying  Oxygen Therapy  SpO2 100 %  O2 Device Room Air  Pain Assessment  Pain Scale 0-10  Pain Score 6  During Hemodialysis Assessment  Blood Flow Rate (mL/min) 400 mL/min  Arterial Pressure (mmHg) -200 mmHg  Venous Pressure (mmHg) 160 mmHg  Transmembrane Pressure (mmHg) 50 mmHg  Ultrafiltration Rate (mL/min) 1140 mL/min  Dialysate Flow Rate (mL/min) 600 ml/min  Conductivity: Machine  14.3  HD Safety Checks Performed Yes  KECN 70.6 KECN  Intra-Hemodialysis Comments Tx completed;Tolerated well (unable to complete rinseback due to clotting. )

## 2019-07-05 NOTE — Progress Notes (Signed)
Post HD Note:     07/05/19 1300  Vital Signs  Temp 98.5 F (36.9 C)  Temp Source Oral  Pulse Rate 99  Pulse Rate Source Dinamap  Resp (!) 23  BP 123/80  BP Location Left Arm  BP Method Automatic  Patient Position (if appropriate) Lying  Oxygen Therapy  SpO2 99 %  O2 Device Room Air  Pain Assessment  Pain Scale 0-10  Pain Score 6  Post-Hemodialysis Assessment  Rinseback Volume (mL) 0 mL  KECN 70.6 V  Dialyzer Clearance Lightly streaked  Duration of HD Treatment -hour(s) 3.5 hour(s)  Hemodialysis Intake (mL) 250 mL  UF Total -Machine (mL) 3656 mL  Net UF (mL) 3406 mL  Tolerated HD Treatment Yes  Post-Hemodialysis Comments  (no)  AVG/AVF Arterial Site Held (minutes)  (n/a)  AVG/AVF Venous Site Held (minutes)  (n/a)  Education / Care Plan  Dialysis Education Provided Yes  Documented Education in Care Plan Yes  Outpatient Plan of Care Reviewed and on Chart Yes  Hemodialysis Catheter Right Internal jugular Double-lumen  Placement Date: 02/28/19   Time Out: Correct patient;Correct site;Correct procedure  Maximum sterile barrier precautions: Hand hygiene;Cap;Mask;Large sterile sheet;Sterile gloves;Sterile gown  Site Prep: Chlorhexidine  Local Anesthetic: Injectable - 1...  Site Condition No complications  Blue Lumen Status Heparin locked  Red Lumen Status Heparin locked  Purple Lumen Status N/A  Catheter fill solution Heparin 1000 units/ml  Catheter fill volume (Arterial) 1.7 cc  Catheter fill volume (Venous) 1.7  Dressing Type Biopatch;Occlusive  Dressing Status Dry;Intact  Drainage Description None  Post treatment catheter status Capped and Clamped

## 2019-07-05 NOTE — Progress Notes (Signed)
[facility] [place] NAME: [name]    MR#:  [id number]  DATE OF BIRTH:  Feb 18, 1986  SUBJECTIVE:  CHIEF COMPLAINT:   Chief Complaint  Patient presents with   Leg Swelling   Bleeding/Bruising   Patient seen during hemodialysis.  No new complaint this morning.  Noted drop in hemoglobin and 1 unit of packed red blood cell transfusion ordered this morning.  No fevers.   REVIEW OF SYSTEMS:  ROS  Constitutional:  Negative for fever, malaise/fatigue and weight loss.  HENT: Negative for congestion, hearing loss and sore throat.   Eyes: Negative for blurred vision and double vision.  Respiratory: Negative for cough, shortness of breath and wheezing.   Cardiovascular: Positive for leg swelling and pain but improving. Negative for chest pain, palpitations and orthopnea.  Gastrointestinal: Negative for abdominal pain, diarrhea, nausea and vomiting.  Genitourinary: Negative for dysuria and urgency.  Musculoskeletal: Negative for myalgias.       Leg pain  Skin: Negative for rash.  Neurological: Negative for dizziness, sensory change, speech change, focal weakness and headaches.  Psychiatric/Behavioral: Negative for depression.   DRUG ALLERGIES:  No Known Allergies VITALS:  Blood pressure 124/71, pulse (!) 103, temperature 98.9 F (37.2 C), temperature source Oral, resp. rate 20, height 5' 6"  (1.676 m), weight 129.3 kg, last menstrual period 06/05/2019, SpO2 100 %. PHYSICAL EXAMINATION:  Physical Exam  GENERAL:  33 y.o.-year-old patient lying in the bed with no acute distress.  EYES: Pupils equal, round, reactive to light and accommodation. No scleral icterus. Extraocular muscles intact.  HEENT: Head atraumatic, normocephalic. Oropharynx and nasopharynx clear.  NECK:  Supple, no jugular venous distention. No thyroid enlargement, no tenderness.  LUNGS: Normal breath sounds bilaterally, no wheezing, rales,rhonchi or crepitation. No use of  accessory muscles of respiration.  CARDIOVASCULAR: S1, S2 normal. No murmurs, rubs, or gallops.  ABDOMEN: Soft, nontender, nondistended. Bowel sounds present. No organomegaly or mass.  EXTREMITIES: Noted significant swelling and redness with tenderness to my palpation to both lower extremity.  Feels very less firm to touch.    MUSCULOSKELETAL: Normal bulk, and power was 5+ grip and elbow, knee, and ankle flexion and extension bilaterally.  NEUROLOGIC:Alert and oriented x 3. CN 2-12 intact. Gait not tested due to safety concern. PSYCHIATRIC: The patient is alert and oriented x 3.  SKIN:  Areas of redness on both lower extremity between the knee joint extending towards the ankle joint.  Areas of healing blisters on both lower extremity.  LABORATORY PANEL:  Female CBC Recent Labs  Lab 07/05/19 0351  WBC 16.6*  HGB 6.5*  HCT 20.1*  PLT 266   ------------------------------------------------------------------------------------------------------------------ Chemistries  Recent Labs  Lab 07/05/19 0351  NA 138  K 4.6  CL 98  CO2 19*  GLUCOSE 97  BUN 65*  CREATININE 12.22*  CALCIUM 8.1*  MG 1.7   RADIOLOGY:  Ct Femur Left Wo Contrast  Result Date: 07/04/2019 CLINICAL DATA:  Left lower extremity pain and swelling EXAM: CT OF THE LOWER LEFT EXTREMITY WITHOUT CONTRAST TECHNIQUE: Multidetector CT imaging of the lower left extremity was performed according to the standard protocol. COMPARISON:  None. FINDINGS: Bones/Joint/Cartilage No acute fracture. No malalignment. Joint spaces are maintained. Small bidirectional calcaneal enthesophytes. No cortical destruction or periostitis. No focal bone lesions. No knee joint effusion. No discernible hip joint effusion. No tibiotalar joint effusion. Ligaments Suboptimally assessed by CT. Muscles and Tendons Preserved muscle bulk. No CT evidence of intramuscular edema. No  intramuscular fluid collections. Tendons about the hip, knee, and ankle including the  Achilles tendon appear intact. No appreciable tenosynovial fluid collection. Soft tissues There is circumferential subcutaneous edema most pronounced from the level of the proximal calf through the ankle. A thin amount of fluid tracks along the superficial investing fascia overlying the calf musculature. No deep fascial fluid collections. No well-defined or drainable fluid collections within the soft tissues. No soft tissue gas. No radiopaque foreign body. Extensive vascular calcifications throughout the lower extremity. Multiple nonenlarged left inguinal lymph nodes. IMPRESSION: 1. Nonspecific circumferential soft tissue edema, most pronounced at the level of the proximal left calf through the ankle. Findings can be seen in fluid overload settings as well as cellulitis in the appropriate clinical setting. No deep fascial fluid. No well-defined or drainable fluid collections. No soft tissue gas. 2. No fracture or evidence of osteomyelitis. 3. No visible joint effusion at the left hip, knee, or ankle. Electronically Signed   By: Davina Poke M.D.   On: 07/04/2019 14:27   Ct Femur Right Wo Contrast  Result Date: 07/04/2019 CLINICAL DATA:  Right lower extremity pain and swelling EXAM: CT OF THE LOWER RIGHT EXTREMITY WITHOUT CONTRAST TECHNIQUE: Multidetector CT imaging of the right lower extremity was performed according to the standard protocol. COMPARISON:  Right tibia-fibula x-ray 07/02/2019 FINDINGS: Bones/Joint/Cartilage No acute fracture. No malalignment. Joint spaces are maintained. Small bidirectional calcaneal enthesophytes. No cortical destruction or periostitis. No focal bone lesions. No knee joint effusion. No discernible hip joint effusion. No tibiotalar joint effusion. Ligaments Suboptimally assessed by CT. Muscles and Tendons Preserved muscle bulk. No CT evidence of intramuscular edema. No intramuscular fluid collections. Tendons about the hip, knee, and ankle including the Achilles tendon appear  intact. No appreciable tenosynovial fluid collection. Soft tissues There is circumferential subcutaneous edema most pronounced from the level of the mid calf through the ankle. A thin amount of fluid tracks along the superficial investing fascia overlying the calf musculature. No deep fascial fluid collections. No well-defined or drainable fluid collections within the soft tissues. No soft tissue gas. No radiopaque foreign body. Extensive vascular calcifications throughout the lower extremity. Multiple nonenlarged right inguinal lymph nodes. Peritoneal drainage catheter partially visualized in the deep pelvis. IMPRESSION: 1. Nonspecific circumferential soft tissue edema, most pronounced at the level of the mid right calf through the ankle. Findings can be seen in fluid overload settings as well as cellulitis in the appropriate clinical setting. No deep fascial fluid. No well-defined or drainable fluid collections. No soft tissue gas. 2. No fracture or evidence of osteomyelitis. 3. No visible joint effusion at the right hip, knee, or ankle. Electronically Signed   By: Davina Poke M.D.   On: 07/04/2019 14:32   Ct Tibia Fibula Left Wo Contrast  Result Date: 07/04/2019 CLINICAL DATA:  Left lower extremity pain and swelling EXAM: CT OF THE LOWER LEFT EXTREMITY WITHOUT CONTRAST TECHNIQUE: Multidetector CT imaging of the lower left extremity was performed according to the standard protocol. COMPARISON:  None. FINDINGS: Bones/Joint/Cartilage No acute fracture. No malalignment. Joint spaces are maintained. Small bidirectional calcaneal enthesophytes. No cortical destruction or periostitis. No focal bone lesions. No knee joint effusion. No discernible hip joint effusion. No tibiotalar joint effusion. Ligaments Suboptimally assessed by CT. Muscles and Tendons Preserved muscle bulk. No CT evidence of intramuscular edema. No intramuscular fluid collections. Tendons about the hip, knee, and ankle including the Achilles  tendon appear intact. No appreciable tenosynovial fluid collection. Soft tissues There is circumferential subcutaneous edema  most pronounced from the level of the proximal calf through the ankle. A thin amount of fluid tracks along the superficial investing fascia overlying the calf musculature. No deep fascial fluid collections. No well-defined or drainable fluid collections within the soft tissues. No soft tissue gas. No radiopaque foreign body. Extensive vascular calcifications throughout the lower extremity. Multiple nonenlarged left inguinal lymph nodes. IMPRESSION: 1. Nonspecific circumferential soft tissue edema, most pronounced at the level of the proximal left calf through the ankle. Findings can be seen in fluid overload settings as well as cellulitis in the appropriate clinical setting. No deep fascial fluid. No well-defined or drainable fluid collections. No soft tissue gas. 2. No fracture or evidence of osteomyelitis. 3. No visible joint effusion at the left hip, knee, or ankle. Electronically Signed   By: Davina Poke M.D.   On: 07/04/2019 14:27   Ct Tibia Fibula Right Wo Contrast  Result Date: 07/04/2019 CLINICAL DATA:  Right lower extremity pain and swelling EXAM: CT OF THE LOWER RIGHT EXTREMITY WITHOUT CONTRAST TECHNIQUE: Multidetector CT imaging of the right lower extremity was performed according to the standard protocol. COMPARISON:  Right tibia-fibula x-ray 07/02/2019 FINDINGS: Bones/Joint/Cartilage No acute fracture. No malalignment. Joint spaces are maintained. Small bidirectional calcaneal enthesophytes. No cortical destruction or periostitis. No focal bone lesions. No knee joint effusion. No discernible hip joint effusion. No tibiotalar joint effusion. Ligaments Suboptimally assessed by CT. Muscles and Tendons Preserved muscle bulk. No CT evidence of intramuscular edema. No intramuscular fluid collections. Tendons about the hip, knee, and ankle including the Achilles tendon appear  intact. No appreciable tenosynovial fluid collection. Soft tissues There is circumferential subcutaneous edema most pronounced from the level of the mid calf through the ankle. A thin amount of fluid tracks along the superficial investing fascia overlying the calf musculature. No deep fascial fluid collections. No well-defined or drainable fluid collections within the soft tissues. No soft tissue gas. No radiopaque foreign body. Extensive vascular calcifications throughout the lower extremity. Multiple nonenlarged right inguinal lymph nodes. Peritoneal drainage catheter partially visualized in the deep pelvis. IMPRESSION: 1. Nonspecific circumferential soft tissue edema, most pronounced at the level of the mid right calf through the ankle. Findings can be seen in fluid overload settings as well as cellulitis in the appropriate clinical setting. No deep fascial fluid. No well-defined or drainable fluid collections. No soft tissue gas. 2. No fracture or evidence of osteomyelitis. 3. No visible joint effusion at the right hip, knee, or ankle. Electronically Signed   By: Davina Poke M.D.   On: 07/04/2019 14:32   Ct Foot Left Wo Contrast  Result Date: 07/04/2019 CLINICAL DATA:  Left lower extremity pain and swelling EXAM: CT OF THE LOWER LEFT EXTREMITY WITHOUT CONTRAST TECHNIQUE: Multidetector CT imaging of the lower left extremity was performed according to the standard protocol. COMPARISON:  None. FINDINGS: Bones/Joint/Cartilage No acute fracture. No malalignment. Joint spaces are maintained. Small bidirectional calcaneal enthesophytes. No cortical destruction or periostitis. No focal bone lesions. No knee joint effusion. No discernible hip joint effusion. No tibiotalar joint effusion. Ligaments Suboptimally assessed by CT. Muscles and Tendons Preserved muscle bulk. No CT evidence of intramuscular edema. No intramuscular fluid collections. Tendons about the hip, knee, and ankle including the Achilles tendon  appear intact. No appreciable tenosynovial fluid collection. Soft tissues There is circumferential subcutaneous edema most pronounced from the level of the proximal calf through the ankle. A thin amount of fluid tracks along the superficial investing fascia overlying the calf musculature. No deep  fascial fluid collections. No well-defined or drainable fluid collections within the soft tissues. No soft tissue gas. No radiopaque foreign body. Extensive vascular calcifications throughout the lower extremity. Multiple nonenlarged left inguinal lymph nodes. IMPRESSION: 1. Nonspecific circumferential soft tissue edema, most pronounced at the level of the proximal left calf through the ankle. Findings can be seen in fluid overload settings as well as cellulitis in the appropriate clinical setting. No deep fascial fluid. No well-defined or drainable fluid collections. No soft tissue gas. 2. No fracture or evidence of osteomyelitis. 3. No visible joint effusion at the left hip, knee, or ankle. Electronically Signed   By: Davina Poke M.D.   On: 07/04/2019 14:27   Ct Foot Right Wo Contrast  Result Date: 07/04/2019 CLINICAL DATA:  Right lower extremity pain and swelling EXAM: CT OF THE LOWER RIGHT EXTREMITY WITHOUT CONTRAST TECHNIQUE: Multidetector CT imaging of the right lower extremity was performed according to the standard protocol. COMPARISON:  Right tibia-fibula x-ray 07/02/2019 FINDINGS: Bones/Joint/Cartilage No acute fracture. No malalignment. Joint spaces are maintained. Small bidirectional calcaneal enthesophytes. No cortical destruction or periostitis. No focal bone lesions. No knee joint effusion. No discernible hip joint effusion. No tibiotalar joint effusion. Ligaments Suboptimally assessed by CT. Muscles and Tendons Preserved muscle bulk. No CT evidence of intramuscular edema. No intramuscular fluid collections. Tendons about the hip, knee, and ankle including the Achilles tendon appear intact. No  appreciable tenosynovial fluid collection. Soft tissues There is circumferential subcutaneous edema most pronounced from the level of the mid calf through the ankle. A thin amount of fluid tracks along the superficial investing fascia overlying the calf musculature. No deep fascial fluid collections. No well-defined or drainable fluid collections within the soft tissues. No soft tissue gas. No radiopaque foreign body. Extensive vascular calcifications throughout the lower extremity. Multiple nonenlarged right inguinal lymph nodes. Peritoneal drainage catheter partially visualized in the deep pelvis. IMPRESSION: 1. Nonspecific circumferential soft tissue edema, most pronounced at the level of the mid right calf through the ankle. Findings can be seen in fluid overload settings as well as cellulitis in the appropriate clinical setting. No deep fascial fluid. No well-defined or drainable fluid collections. No soft tissue gas. 2. No fracture or evidence of osteomyelitis. 3. No visible joint effusion at the right hip, knee, or ankle. Electronically Signed   By: Davina Poke M.D.   On: 07/04/2019 14:32   ASSESSMENT AND PLAN:   33 y.o. female with past medical history of ESR D on HD Tuesdays, Thursdays and Saturdays, hypertension, diabetes mellitus, CHF, anemia, and asthma presenting to the ED with chief complaints of bilateral leg swelling and pain.  1. Sepsis -secondary to bilateral lower extremity cellulitis Patient on empiric antibiotics with IV vancomycin, cefepime and Flagyl So far no growth on blood cultures.   Leukocytosis improved. Ultrasound done with no evidence of DVT. There was initial concern for possible compartment syndrome yesterday.  General surgery consulted.  CT of both lower extremity without contrast done on 07/04/2019 which revealed nonspecific circumferential soft tissue edema, most pronounced at the level of the mid right calf through the ankle. Findings can be seen in fluid overload  settings as well as cellulitis in the appropriate clinical setting. No deep fascial fluid. No well-defined or drainable fluid collections. Appears to be improving with ongoing hemodialysis. Patient reevaluated by general surgeon today.  Bilateral lower extremity cellulitis and edema improving with ongoing hemodialysis.  No indication for any surgical intervention at this time.  2.  ESRD on  HD Tuesdays, Thursdays and Saturday -BUN/creatinine elevated above baseline likely due to missing dialysis  Patient had hemodialysis yesterday.  Also having hemodialysis today due to concern for fluid overload .Nephrology following.  3.  Acute on chronic systolicCongestive Heart Failure: Bilateral lower extremity edema Last Echo 01/2016 withEF 50-55% - Chest x-ray shows vascular congestion likely due to volume overload - Continue Demadex - Continue Imdur and Coreg -Continue inpatient hemodialysis. Low salt diet - Check daily weight - Strict I&Os  4.HTN- stable Blood pressure controlled on current regimen  5. Diabetes Mellitus Type 2 with complications Glycosylated hemoglobin level less than 4.2.  Sliding scale insulin coverage  6. Anemia of CKD: Noted further drop in hemoglobin to 6.5 today.  Ordered 1 unit of packed red blood cell transfusion today to be given during hemodialysis   DVT prophylaxis - hold Heparin due to anemia requiring packed red blood cell transfusion  Patient cannot tolerate SCDs to both lower extremity due to severe pains  All the records are reviewed and case discussed with Care Management/Social Worker. Management plans discussed with the patient, family and they are in agreement. Called and updated patient's mom; Ms. Arion Morgan [phone]) today.  She was updated on treatment plans as outlined above.  All questions were answered.  CODE STATUS: Full Code  TOTAL TIME TAKING CARE OF THIS PATIENT: 36 minutes.   More than 50% of the time was spent in  counseling/coordination of care: YES  POSSIBLE D/C IN 3 DAYS, DEPENDING ON CLINICAL CONDITION.   Brooks Stotz M.D on 07/05/2019 at 1:37 PM  Between 7am to 6pm - Pager - [phone]  After 6pm go to [email or web address] - Proofreader  [facility]  Office  [phone]  CC: Primary care physician; [facility]  Note: This dictation was prepared with Dragon dictation along with smaller phrase technology. Any transcriptional errors that result from this process are unintentional.

## 2019-07-05 NOTE — Progress Notes (Signed)
Pt hgb this AM was 6.5 ; 7.3 yesterday. VSS, Primary nurse paged and spoke to Dr. Marcille Blanco. No new orders at this point. Primary nurse to continue to monitor pt.

## 2019-07-05 NOTE — Progress Notes (Signed)
Pre HD Assessment:     07/05/19 0920  Neurological  Level of Consciousness Alert  Orientation Level Oriented X4  Respiratory  Respiratory Pattern Regular;Unlabored  Cardiac  Pulse Regular  Heart Sounds S1, S2  Jugular Venous Distention (JVD) No  ECG Monitor Yes  Vascular  R Radial Pulse +2  L Radial Pulse +2  Psychosocial  Psychosocial (WDL) WDL

## 2019-07-05 NOTE — Progress Notes (Signed)
HD Initiated:     07/05/19 0926  Vital Signs  Temp 98.9 F (37.2 C)  Temp Source Oral  Pulse Rate 96  Pulse Rate Source Dinamap  Resp 20  BP 113/73  BP Location Left Arm  BP Method Automatic  Patient Position (if appropriate) Lying  Oxygen Therapy  SpO2 98 %  O2 Device Room Air  Pain Assessment  Pain Scale 0-10  Pain Score 5  Dialysis Weight  Weight 129.3 kg  Type of Weight Pre-Dialysis  During Hemodialysis Assessment  Blood Flow Rate (mL/min) 400 mL/min  Arterial Pressure (mmHg) -150 mmHg  Venous Pressure (mmHg) 130 mmHg  Transmembrane Pressure (mmHg) 50 mmHg  Ultrafiltration Rate (mL/min) 1140 mL/min  Dialysate Flow Rate (mL/min) 600 ml/min  Conductivity: Machine  14.3  HD Safety Checks Performed Yes  Dialysis Fluid Bolus Normal Saline  Bolus Amount (mL) 250 mL  Intra-Hemodialysis Comments Tx initiated

## 2019-07-05 NOTE — Progress Notes (Signed)
Subjective:  CC: [name] is a 33 y.o. female  Hospital stay day 3,   BLE cellulitis, concern for compartment syndrome  HPI: Still painful, but swelling has improved.    ROS:  General: Denies weight loss, weight gain, fatigue, fevers, chills, and night sweats. Heart: Denies chest pain, palpitations, racing heart, irregular heartbeat, leg pain or swelling, and decreased activity tolerance. Respiratory: Denies breathing difficulty, shortness of breath, wheezing, cough, and sputum. GI: Denies change in appetite, heartburn, nausea, vomiting, constipation, diarrhea, and blood in stool. GU: Denies difficulty urinating, pain with urinating, urgency, frequency, blood in urine.   Objective:   Temp:  [98.5 F (36.9 C)-99.9 F (37.7 C)] 98.5 F (36.9 C) (09/18 0503) Pulse Rate:  [102-121] 102 (09/18 0503) Resp:  [10-30] 20 (09/18 0503) BP: (122-170)/(73-142) 124/78 (09/18 0503) SpO2:  [94 %-100 %] 100 % (09/18 0503)     Height: 5\' 6"  (167.6 cm) Weight: 129.3 kg BMI (Calculated): 46.02   Intake/Output this shift:   Intake/Output Summary (Last 24 hours) at 07/05/2019 0731 Last data filed at 07/04/2019 1858 Gross per 24 hour  Intake 767.01 ml  Output 3500 ml  Net -2732.99 ml    Constitutional :  alert, cooperative, appears stated age and no distress  Respiratory:  clear to auscultation bilaterally  Cardiovascular:  regular rate and rhythm  Gastrointestinal: Cool and moist.  Bilateral lower extremity swelling and tenderness decreasing. Overall distritbution remains unchanged.  hematoma on anterior aspect stable.  Sensation and motor are intact bilaterally in the foot and ankle region, with no pain with full dorsiflexion and plantar flexion either passively or with active motion.  Doppler noted pulses in bilateral DP and PT.    Skin: Cool and moist.   Psychiatric: Normal affect, non-agitated, not confused       LABS:  CMP Latest Ref Rng & Units 07/05/2019 07/04/2019 07/03/2019   Glucose 70 - 99 mg/dL 97 106(H) 96  BUN 6 - 20 mg/dL 65(H) 55(H) 73(H)  Creatinine 0.44 - 1.00 mg/dL 12.22(H) 10.53(H) 13.43(H)  Sodium 135 - 145 mmol/L 138 138 138  Potassium 3.5 - 5.1 mmol/L 4.6 4.4 4.5  Chloride 98 - 111 mmol/L 98 100 103  CO2 22 - 32 mmol/L 19(L) 23 20(L)  Calcium 8.9 - 10.3 mg/dL 8.1(L) 8.0(L) 7.7(L)  Total Protein 6.5 - 8.1 g/dL - - -  Total Bilirubin 0.3 - 1.2 mg/dL - - -  Alkaline Phos 38 - 126 U/L - - -  AST 15 - 41 U/L - - -  ALT 14 - 54 U/L - - -   CBC Latest Ref Rng & Units 07/05/2019 07/04/2019 07/03/2019  WBC 4.0 - 10.5 K/uL 16.6(H) 16.8(H) 21.4(H)  Hemoglobin 12.0 - 15.0 g/dL 6.5(L) 7.3(L) 7.5(L)  Hematocrit 36.0 - 46.0 % 20.1(L) 23.5(L) 24.7(L)  Platelets 150 - 400 K/uL 266 264 296    RADS: n/a Assessment:   BLE cellulitis, Fluid overload ESRD   Symptoms and overall clinical exam improving since HD and IV abx yesterday.  No need for any surgical intervention at this time.  Continue supportive care per primary and nephrology for cellulitis and ESRD, fluid overload.  Will peripherally follow for now.  Please call with any additional questions or concerns.

## 2019-07-05 NOTE — Care Management Important Message (Signed)
Important Message  Patient Details  Name: [name] MRN: [id number] Date of Birth: [birth year]   Medicare Important Message Given:  Yes     Dannette Barbara 07/05/2019, 11:17 AM

## 2019-07-06 LAB — TYPE AND SCREEN
ABO/RH(D): A POS
Antibody Screen: NEGATIVE
Unit division: 0
Unit division: 0

## 2019-07-06 LAB — BPAM RBC
Blood Product Expiration Date: 202009192359
Blood Product Expiration Date: 202010172359
ISSUE DATE / TIME: 202009181338
ISSUE DATE / TIME: 202009190026
Unit Type and Rh: 6200
Unit Type and Rh: 9500

## 2019-07-06 LAB — CBC
HCT: 21.1 % — ABNORMAL LOW (ref 36.0–46.0)
Hemoglobin: 7 g/dL — ABNORMAL LOW (ref 12.0–15.0)
MCH: 28 pg (ref 26.0–34.0)
MCHC: 33.2 g/dL (ref 30.0–36.0)
MCV: 84.4 fL (ref 80.0–100.0)
Platelets: 298 10*3/uL (ref 150–400)
RBC: 2.5 MIL/uL — ABNORMAL LOW (ref 3.87–5.11)
RDW: 16.9 % — ABNORMAL HIGH (ref 11.5–15.5)
WBC: 20.7 10*3/uL — ABNORMAL HIGH (ref 4.0–10.5)
nRBC: 0 % (ref 0.0–0.2)

## 2019-07-06 LAB — VANCOMYCIN, RANDOM: Vancomycin Rm: 38

## 2019-07-06 LAB — BASIC METABOLIC PANEL
Anion gap: 24 — ABNORMAL HIGH (ref 5–15)
BUN: 43 mg/dL — ABNORMAL HIGH (ref 6–20)
CO2: 21 mmol/L — ABNORMAL LOW (ref 22–32)
Calcium: 8.8 mg/dL — ABNORMAL LOW (ref 8.9–10.3)
Chloride: 95 mmol/L — ABNORMAL LOW (ref 98–111)
Creatinine, Ser: 8.6 mg/dL — ABNORMAL HIGH (ref 0.44–1.00)
GFR calc Af Amer: 6 mL/min — ABNORMAL LOW (ref >60)
GFR calc non Af Amer: 5 mL/min — ABNORMAL LOW (ref >60)
Glucose, Bld: 110 mg/dL — ABNORMAL HIGH (ref 70–99)
Potassium: 4.6 mmol/L (ref 3.5–5.1)
Sodium: 140 mmol/L (ref 135–145)

## 2019-07-06 LAB — ALBUMIN: Albumin: 2.2 g/dL — ABNORMAL LOW (ref 3.5–5.0)

## 2019-07-06 LAB — MAGNESIUM: Magnesium: 1.8 mg/dL (ref 1.7–2.4)

## 2019-07-06 LAB — GLUCOSE, CAPILLARY
Glucose-Capillary: 110 mg/dL — ABNORMAL HIGH (ref 70–99)
Glucose-Capillary: 136 mg/dL — ABNORMAL HIGH (ref 70–99)

## 2019-07-06 LAB — PHOSPHORUS: Phosphorus: 6.1 mg/dL — ABNORMAL HIGH (ref 2.5–4.6)

## 2019-07-06 MED ORDER — GABAPENTIN 300 MG PO CAPS
300.0000 mg | ORAL_CAPSULE | Freq: Every day | ORAL | Status: DC
  Administered 2019-07-06 – 2019-07-09 (×3): 300 mg via ORAL
  Filled 2019-07-06: qty 1
  Filled 2019-07-06: qty 3
  Filled 2019-07-06 (×2): qty 1

## 2019-07-06 NOTE — Progress Notes (Signed)
Hd started  

## 2019-07-06 NOTE — Progress Notes (Signed)
This note also relates to the following rows which could not be included: Pulse Rate - Cannot attach notes to unvalidated device data Resp - Cannot attach notes to unvalidated device data  Hd completed  

## 2019-07-06 NOTE — Progress Notes (Signed)
[place] [facility] [place] NAME: [name]    MR#:  [id number]  DATE OF BIRTH:  05/14/1986  SUBJECTIVE:  CHIEF COMPLAINT:   Chief Complaint  Patient presents with  . Leg Swelling  . Bleeding/Bruising   Patient seen and evaluated today Tolerated PRBC transfusion well yesterday Due for dialysis No chest pain No fever  REVIEW OF SYSTEMS:  ROS  Constitutional:  Negative for fever, malaise/fatigue and weight loss.  HENT: Negative for congestion, hearing loss and sore throat.   Eyes: Negative for blurred vision and double vision.  Respiratory: Negative for cough, shortness of breath and wheezing.   Cardiovascular: Positive for leg swelling and pain but improving. Negative for chest pain, palpitations and orthopnea.  Gastrointestinal: Negative for abdominal pain, diarrhea, nausea and vomiting.  Genitourinary: Negative for dysuria and urgency.  Musculoskeletal: Negative for myalgias.       Leg pain  Skin: Negative for rash.  Neurological: Negative for dizziness, sensory change, speech change, focal weakness and headaches.  Psychiatric/Behavioral: Negative for depression.   DRUG ALLERGIES:  No Known Allergies VITALS:  Blood pressure 109/64, pulse 93, temperature 98.8 F (37.1 C), temperature source Oral, resp. rate 18, height 5' 6"  (1.676 m), weight 131.1 kg, last menstrual period 06/05/2019, SpO2 96 %. PHYSICAL EXAMINATION:  Physical Exam  GENERAL:  33 y.o.-year-old patient lying in the bed with no acute distress.  EYES: Pupils equal, round, reactive to light and accommodation. No scleral icterus. Extraocular muscles intact.  HEENT: Head atraumatic, normocephalic. Oropharynx and nasopharynx clear.  NECK:  Supple, no jugular venous distention. No thyroid enlargement, no tenderness.  LUNGS: Normal breath sounds bilaterally, no wheezing, rales,rhonchi or crepitation. No use of accessory muscles of respiration.  CARDIOVASCULAR: S1, S2  normal. No murmurs, rubs, or gallops.  ABDOMEN: Soft, nontender, nondistended. Bowel sounds present. No organomegaly or mass.  EXTREMITIES: Noted significant swelling and redness with tenderness to my palpation to both lower extremity.  Feels very less firm to touch.    MUSCULOSKELETAL: Normal bulk, and power was 5+ grip and elbow, knee, and ankle flexion and extension bilaterally.  NEUROLOGIC:Alert and oriented x 3. CN 2-12 intact. Gait not tested due to safety concern. PSYCHIATRIC: The patient is alert and oriented x 3.  SKIN:  Areas of redness on both lower extremity between the knee joint extending towards the ankle joint.  Areas of healing blisters on both lower extremity.  LABORATORY PANEL:  Female CBC Recent Labs  Lab 07/06/19 0440  WBC 20.7*  HGB 7.0*  HCT 21.1*  PLT 298   ------------------------------------------------------------------------------------------------------------------ Chemistries  Recent Labs  Lab 07/06/19 0440  NA 140  K 4.6  CL 95*  CO2 21*  GLUCOSE 110*  BUN 43*  CREATININE 8.60*  CALCIUM 8.8*  MG 1.8   RADIOLOGY:  No results found. ASSESSMENT AND PLAN:   33 y.o. female with past medical history of ESR D on HD Tuesdays, Thursdays and Saturdays, hypertension, diabetes mellitus, CHF, anemia, and asthma presenting to the ED with chief complaints of bilateral leg swelling and pain.  1. Sepsis -secondary to bilateral lower extremity cellulitis Patient on empiric antibiotics with IV vancomycin, cefepime and Flagyl So far no growth on blood cultures.   Leukocytosis persists.  No fevers noted. Ultrasound done with no evidence of DVT. There was initial concern for possible compartment syndrome yesterday.  General surgery consulted.  CT of both lower extremity without contrast done on 07/04/2019 which revealed nonspecific circumferential soft tissue  edema, most pronounced at the level of the mid right calf through the ankle. Findings can be seen in fluid  overload settings as well as cellulitis in the appropriate clinical setting. No deep fascial fluid. No well-defined or drainable fluid collections. Appears to be improving with ongoing hemodialysis. Patient reevaluated by general surgeon .  Bilateral lower extremity cellulitis and edema improving with ongoing hemodialysis.  No indication for any surgical intervention at this time.  2.  ESRD on HD Tuesdays, Thursdays and Saturday -BUN/creatinine elevated above baseline likely due to missing dialysis  Patient had hemodialysis yesterday.  Also having hemodialysis today due to concern for fluid overload .Nephrology following.  3.  Acute on chronic systolicCongestive Heart Failure: Bilateral lower extremity edema Last Echo 01/2016 withEF 50-55% - Chest x-ray shows vascular congestion likely due to volume overload - Continue Demadex - Continue Imdur and Coreg -Continue inpatient hemodialysis. Low salt diet - Check daily weight - Strict I&Os  4.HTN- stable Blood pressure controlled on current regimen  5. Diabetes Mellitus Type 2 with complications Glycosylated hemoglobin level less than 4.2.  Sliding scale insulin coverage  6. Anemia of CKD: Status post PRBC transfusion Hemoglobin around 7   DVT prophylaxis - hold Heparin due to anemia requiring packed red blood cell transfusion  Patient cannot tolerate SCDs to both lower extremity due to severe pains  All the records are reviewed and case discussed with Care Management/Social Worker. Management plans discussed with the patient, family and they are in agreement. Called and updated patient's mom; Ms. Lucelia Lacey [phone]).  She was updated on treatment plans as outlined above.  All questions were answered.  CODE STATUS: Full Code  TOTAL TIME TAKING CARE OF THIS PATIENT: 34 minutes.   More than 50% of the time was spent in counseling/coordination of care: YES  POSSIBLE D/C IN 3 DAYS, DEPENDING ON CLINICAL  CONDITION.   Saundra Shelling M.D on 07/06/2019 at 12:47 PM  Between 7am to 6pm - Pager - [phone]  After 6pm go to [email or web address] - Proofreader  [facility]  Office  [phone]  CC: Primary care physician; [facility]  Note: This dictation was prepared with Dragon dictation along with smaller phrase technology. Any transcriptional errors that result from this process are unintentional.

## 2019-07-06 NOTE — Progress Notes (Signed)
Updated patient's Mother over phone.  Fuller Mandril, RN

## 2019-07-06 NOTE — Progress Notes (Signed)
[facility], [place] 07/06/19  Subjective:   LOS: 4 09/18 0701 - 09/19 0700 In: 360 [Blood:360] Out: 3406  Patient presented to ER for worsening LE edema and painful ulcers Seen during HD Still c/o leg pain PD catheter flushed 9/18 (friday)   HEMODIALYSIS FLOWSHEET:  Blood Flow Rate (mL/min): 400 mL/min Arterial Pressure (mmHg): -160 mmHg Venous Pressure (mmHg): 150 mmHg Transmembrane Pressure (mmHg): 10 mmHg Ultrafiltration Rate (mL/min): 770 mL/min Dialysate Flow Rate (mL/min): 0 ml/min Conductivity: Machine : 14 Conductivity: Machine : 14 Dialysis Fluid Bolus: Normal Saline Bolus Amount (mL): 250 mL  So far [phone] cc removed with HD last 3 days   Objective:  Vital signs in last 24 hours:  Temp:  [98.6 F (37 C)-101.8 F (38.8 C)] 98.8 F (37.1 C) (09/19 1040) Pulse Rate:  [92-102] 92 (09/19 1315) Resp:  [16-26] 19 (09/19 1315) BP: (95-132)/(58-79) 105/67 (09/19 1315) SpO2:  [95 %-100 %] 96 % (09/19 0445) Weight:  [131.1 kg] 131.1 kg (09/19 1040)  Weight change:  Filed Weights   07/05/19 0920 07/05/19 0926 07/06/19 1040  Weight: 129.3 kg 129.3 kg 131.1 kg    Intake/Output:    Intake/Output Summary (Last 24 hours) at 07/06/2019 1326 Last data filed at 07/06/2019 [id number] Gross per 24 hour  Intake 1414.58 ml  Output 0 ml  Net 1414.58 ml    Gen:   Alert, cooperative, no distress, appears stated age Head:   Normocephalic, without obvious abnormality, atraumatic Eyes/ENT:  conjunctiva/corneas clear,  moist oral mucus membranes Neck:  Supple,  thyroid: not enlarged, no JVD Lungs:   Clear to auscultation bilaterally, respirations unlabored, O2 by [place] Heart:   Regular rate and rhythm, S1, S2 normal, no murmur, rub or gallop Abdomen:   Soft, non-tender  Extremities: no cyanosis; 2-3+  Edema;  Skin:  Skin color, texture, turgor normal, ulcerative lesions at back of calf- rt Neurologic: Alert and oriented, able to answer  questions appropriately Access: Permacath, PD catheter in place       Basic Metabolic Panel:  Recent Labs  Lab 07/02/19 1947 07/03/19 0417 07/04/19 0416 07/05/19 0351 07/06/19 0440  NA 139 138 138 138 140  K 4.6 4.5 4.4 4.6 4.6  CL 100 103 100 98 95*  CO2 21* 20* 23 19* 21*  GLUCOSE 104* 96 106* 97 110*  BUN 69* 73* 55* 65* 43*  CREATININE 12.98* 13.43* 10.53* 12.22* 8.60*  CALCIUM 8.3* 7.7* 8.0* 8.1* 8.8*  MG  --  1.7  --  1.7 1.8  PHOS  --   --  7.8* 8.6* 6.1*     CBC: Recent Labs  Lab 07/02/19 1947 07/03/19 0417 07/04/19 0416 07/05/19 0351 07/05/19 1425 07/05/19 1847 07/06/19 0440  WBC 24.1* 21.4* 16.8* 16.6*  --   --  20.7*  HGB 8.8* 7.5* 7.3* 6.5* 5.7* 7.9* 7.0*  HCT 28.3* 24.7* 23.5* 20.1*  --  23.5* 21.1*  MCV 88.7 90.1 88.0 84.8  --   --  84.4  PLT 327 296 264 266  --   --  298      Lab Results  Component Value Date   HEPBSAG Negative 02/11/2016   HEPBSAB Non Reactive 02/11/2016      Microbiology:  Recent Results (from the past 240 hour(s))  Culture, blood (Routine x 2)     Status: None (Preliminary result)   Collection Time: 07/02/19  8:08 PM   Specimen: BLOOD  Result Value Ref Range Status   Specimen Description BLOOD LEFT  ANTECUBITAL  Final   Special Requests   Final    BOTTLES DRAWN AEROBIC AND ANAEROBIC Blood Culture results may not be optimal due to an excessive volume of blood received in culture bottles   Culture   Final    NO GROWTH 4 DAYS Performed at [facility], [address], [place], [place] [address]    Report Status PENDING  Incomplete  Culture, blood (Routine x 2)     Status: None (Preliminary result)   Collection Time: 07/02/19  8:08 PM   Specimen: BLOOD  Result Value Ref Range Status   Specimen Description BLOOD BLOOD LEFT HAND  Final   Special Requests   Final    BOTTLES DRAWN AEROBIC AND ANAEROBIC Blood Culture adequate volume   Culture   Final    NO GROWTH 4 DAYS Performed at [facility], [address], [place], [place] [address]    Report Status PENDING  Incomplete  SARS CORONAVIRUS 2 (TAT 6-24 HRS) Nasopharyngeal Nasopharyngeal Swab     Status: None   Collection Time: 07/02/19 10:48 PM   Specimen: Nasopharyngeal Swab  Result Value Ref Range Status   SARS Coronavirus 2 NEGATIVE NEGATIVE Final    Comment: (NOTE) SARS-CoV-2 target nucleic acids are NOT DETECTED. The SARS-CoV-2 RNA is generally detectable in upper and lower respiratory specimens during the acute phase of infection. Negative results do not preclude SARS-CoV-2 infection, do not rule out co-infections with other pathogens, and should not be used as the sole basis for treatment or other patient management decisions. Negative results must be combined with clinical observations, patient history, and epidemiological information. The expected result is Negative. Fact Sheet for Patients: SugarRoll.be Fact Sheet for Healthcare Providers: https://www.woods-mathews.com/ This test is not yet approved or cleared by the [place] FDA and  has been authorized for detection and/or diagnosis of SARS-CoV-2 by FDA under an Emergency Use Authorization (EUA). This EUA will remain  in effect (meaning this test can be used) for the duration of the COVID-19 declaration under Section 56 4(b)(1) of the Act, 21 U.S.C. section 360bbb-3(b)(1), unless the authorization is terminated or revoked sooner. Performed at [facility], [place] [address], [place], [place] [address]     Coagulation Studies: No results for input(s): LABPROT, INR in the last 72 hours.  Urinalysis: No results for input(s): COLORURINE, LABSPEC, PHURINE, GLUCOSEU, HGBUR, BILIRUBINUR, KETONESUR, PROTEINUR, UROBILINOGEN, NITRITE, LEUKOCYTESUR in the last 72 hours.  Invalid input(s): APPERANCEUR    Imaging: Ct Femur Left Wo Contrast  Result Date: 07/04/2019 CLINICAL DATA:  Left lower extremity pain  and swelling EXAM: CT OF THE LOWER LEFT EXTREMITY WITHOUT CONTRAST TECHNIQUE: Multidetector CT imaging of the lower left extremity was performed according to the standard protocol. COMPARISON:  None. FINDINGS: Bones/Joint/Cartilage No acute fracture. No malalignment. Joint spaces are maintained. Small bidirectional calcaneal enthesophytes. No cortical destruction or periostitis. No focal bone lesions. No knee joint effusion. No discernible hip joint effusion. No tibiotalar joint effusion. Ligaments Suboptimally assessed by CT. Muscles and Tendons Preserved muscle bulk. No CT evidence of intramuscular edema. No intramuscular fluid collections. Tendons about the hip, knee, and ankle including the Achilles tendon appear intact. No appreciable tenosynovial fluid collection. Soft tissues There is circumferential subcutaneous edema most pronounced from the level of the proximal calf through the ankle. A thin amount of fluid tracks along the superficial investing fascia overlying the calf musculature. No deep fascial fluid collections. No well-defined or drainable fluid collections within the soft tissues. No  soft tissue gas. No radiopaque foreign body. Extensive vascular calcifications throughout the lower extremity. Multiple nonenlarged left inguinal lymph nodes. IMPRESSION: 1. Nonspecific circumferential soft tissue edema, most pronounced at the level of the proximal left calf through the ankle. Findings can be seen in fluid overload settings as well as cellulitis in the appropriate clinical setting. No deep fascial fluid. No well-defined or drainable fluid collections. No soft tissue gas. 2. No fracture or evidence of osteomyelitis. 3. No visible joint effusion at the left hip, knee, or ankle. Electronically Signed   By: Davina Poke M.D.   On: 07/04/2019 14:27   Ct Femur Right Wo Contrast  Result Date: 07/04/2019 CLINICAL DATA:  Right lower extremity pain and swelling EXAM: CT OF THE LOWER RIGHT EXTREMITY  WITHOUT CONTRAST TECHNIQUE: Multidetector CT imaging of the right lower extremity was performed according to the standard protocol. COMPARISON:  Right tibia-fibula x-ray 07/02/2019 FINDINGS: Bones/Joint/Cartilage No acute fracture. No malalignment. Joint spaces are maintained. Small bidirectional calcaneal enthesophytes. No cortical destruction or periostitis. No focal bone lesions. No knee joint effusion. No discernible hip joint effusion. No tibiotalar joint effusion. Ligaments Suboptimally assessed by CT. Muscles and Tendons Preserved muscle bulk. No CT evidence of intramuscular edema. No intramuscular fluid collections. Tendons about the hip, knee, and ankle including the Achilles tendon appear intact. No appreciable tenosynovial fluid collection. Soft tissues There is circumferential subcutaneous edema most pronounced from the level of the mid calf through the ankle. A thin amount of fluid tracks along the superficial investing fascia overlying the calf musculature. No deep fascial fluid collections. No well-defined or drainable fluid collections within the soft tissues. No soft tissue gas. No radiopaque foreign body. Extensive vascular calcifications throughout the lower extremity. Multiple nonenlarged right inguinal lymph nodes. Peritoneal drainage catheter partially visualized in the deep pelvis. IMPRESSION: 1. Nonspecific circumferential soft tissue edema, most pronounced at the level of the mid right calf through the ankle. Findings can be seen in fluid overload settings as well as cellulitis in the appropriate clinical setting. No deep fascial fluid. No well-defined or drainable fluid collections. No soft tissue gas. 2. No fracture or evidence of osteomyelitis. 3. No visible joint effusion at the right hip, knee, or ankle. Electronically Signed   By: Davina Poke M.D.   On: 07/04/2019 14:32   Ct Tibia Fibula Left Wo Contrast  Result Date: 07/04/2019 CLINICAL DATA:  Left lower extremity pain and  swelling EXAM: CT OF THE LOWER LEFT EXTREMITY WITHOUT CONTRAST TECHNIQUE: Multidetector CT imaging of the lower left extremity was performed according to the standard protocol. COMPARISON:  None. FINDINGS: Bones/Joint/Cartilage No acute fracture. No malalignment. Joint spaces are maintained. Small bidirectional calcaneal enthesophytes. No cortical destruction or periostitis. No focal bone lesions. No knee joint effusion. No discernible hip joint effusion. No tibiotalar joint effusion. Ligaments Suboptimally assessed by CT. Muscles and Tendons Preserved muscle bulk. No CT evidence of intramuscular edema. No intramuscular fluid collections. Tendons about the hip, knee, and ankle including the Achilles tendon appear intact. No appreciable tenosynovial fluid collection. Soft tissues There is circumferential subcutaneous edema most pronounced from the level of the proximal calf through the ankle. A thin amount of fluid tracks along the superficial investing fascia overlying the calf musculature. No deep fascial fluid collections. No well-defined or drainable fluid collections within the soft tissues. No soft tissue gas. No radiopaque foreign body. Extensive vascular calcifications throughout the lower extremity. Multiple nonenlarged left inguinal lymph nodes. IMPRESSION: 1. Nonspecific circumferential soft tissue edema, most pronounced  at the level of the proximal left calf through the ankle. Findings can be seen in fluid overload settings as well as cellulitis in the appropriate clinical setting. No deep fascial fluid. No well-defined or drainable fluid collections. No soft tissue gas. 2. No fracture or evidence of osteomyelitis. 3. No visible joint effusion at the left hip, knee, or ankle. Electronically Signed   By: Davina Poke M.D.   On: 07/04/2019 14:27   Ct Tibia Fibula Right Wo Contrast  Result Date: 07/04/2019 CLINICAL DATA:  Right lower extremity pain and swelling EXAM: CT OF THE LOWER RIGHT EXTREMITY  WITHOUT CONTRAST TECHNIQUE: Multidetector CT imaging of the right lower extremity was performed according to the standard protocol. COMPARISON:  Right tibia-fibula x-ray 07/02/2019 FINDINGS: Bones/Joint/Cartilage No acute fracture. No malalignment. Joint spaces are maintained. Small bidirectional calcaneal enthesophytes. No cortical destruction or periostitis. No focal bone lesions. No knee joint effusion. No discernible hip joint effusion. No tibiotalar joint effusion. Ligaments Suboptimally assessed by CT. Muscles and Tendons Preserved muscle bulk. No CT evidence of intramuscular edema. No intramuscular fluid collections. Tendons about the hip, knee, and ankle including the Achilles tendon appear intact. No appreciable tenosynovial fluid collection. Soft tissues There is circumferential subcutaneous edema most pronounced from the level of the mid calf through the ankle. A thin amount of fluid tracks along the superficial investing fascia overlying the calf musculature. No deep fascial fluid collections. No well-defined or drainable fluid collections within the soft tissues. No soft tissue gas. No radiopaque foreign body. Extensive vascular calcifications throughout the lower extremity. Multiple nonenlarged right inguinal lymph nodes. Peritoneal drainage catheter partially visualized in the deep pelvis. IMPRESSION: 1. Nonspecific circumferential soft tissue edema, most pronounced at the level of the mid right calf through the ankle. Findings can be seen in fluid overload settings as well as cellulitis in the appropriate clinical setting. No deep fascial fluid. No well-defined or drainable fluid collections. No soft tissue gas. 2. No fracture or evidence of osteomyelitis. 3. No visible joint effusion at the right hip, knee, or ankle. Electronically Signed   By: Davina Poke M.D.   On: 07/04/2019 14:32   Ct Foot Left Wo Contrast  Result Date: 07/04/2019 CLINICAL DATA:  Left lower extremity pain and swelling  EXAM: CT OF THE LOWER LEFT EXTREMITY WITHOUT CONTRAST TECHNIQUE: Multidetector CT imaging of the lower left extremity was performed according to the standard protocol. COMPARISON:  None. FINDINGS: Bones/Joint/Cartilage No acute fracture. No malalignment. Joint spaces are maintained. Small bidirectional calcaneal enthesophytes. No cortical destruction or periostitis. No focal bone lesions. No knee joint effusion. No discernible hip joint effusion. No tibiotalar joint effusion. Ligaments Suboptimally assessed by CT. Muscles and Tendons Preserved muscle bulk. No CT evidence of intramuscular edema. No intramuscular fluid collections. Tendons about the hip, knee, and ankle including the Achilles tendon appear intact. No appreciable tenosynovial fluid collection. Soft tissues There is circumferential subcutaneous edema most pronounced from the level of the proximal calf through the ankle. A thin amount of fluid tracks along the superficial investing fascia overlying the calf musculature. No deep fascial fluid collections. No well-defined or drainable fluid collections within the soft tissues. No soft tissue gas. No radiopaque foreign body. Extensive vascular calcifications throughout the lower extremity. Multiple nonenlarged left inguinal lymph nodes. IMPRESSION: 1. Nonspecific circumferential soft tissue edema, most pronounced at the level of the proximal left calf through the ankle. Findings can be seen in fluid overload settings as well as cellulitis in the appropriate clinical setting. No  deep fascial fluid. No well-defined or drainable fluid collections. No soft tissue gas. 2. No fracture or evidence of osteomyelitis. 3. No visible joint effusion at the left hip, knee, or ankle. Electronically Signed   By: Davina Poke M.D.   On: 07/04/2019 14:27   Ct Foot Right Wo Contrast  Result Date: 07/04/2019 CLINICAL DATA:  Right lower extremity pain and swelling EXAM: CT OF THE LOWER RIGHT EXTREMITY WITHOUT CONTRAST  TECHNIQUE: Multidetector CT imaging of the right lower extremity was performed according to the standard protocol. COMPARISON:  Right tibia-fibula x-ray 07/02/2019 FINDINGS: Bones/Joint/Cartilage No acute fracture. No malalignment. Joint spaces are maintained. Small bidirectional calcaneal enthesophytes. No cortical destruction or periostitis. No focal bone lesions. No knee joint effusion. No discernible hip joint effusion. No tibiotalar joint effusion. Ligaments Suboptimally assessed by CT. Muscles and Tendons Preserved muscle bulk. No CT evidence of intramuscular edema. No intramuscular fluid collections. Tendons about the hip, knee, and ankle including the Achilles tendon appear intact. No appreciable tenosynovial fluid collection. Soft tissues There is circumferential subcutaneous edema most pronounced from the level of the mid calf through the ankle. A thin amount of fluid tracks along the superficial investing fascia overlying the calf musculature. No deep fascial fluid collections. No well-defined or drainable fluid collections within the soft tissues. No soft tissue gas. No radiopaque foreign body. Extensive vascular calcifications throughout the lower extremity. Multiple nonenlarged right inguinal lymph nodes. Peritoneal drainage catheter partially visualized in the deep pelvis. IMPRESSION: 1. Nonspecific circumferential soft tissue edema, most pronounced at the level of the mid right calf through the ankle. Findings can be seen in fluid overload settings as well as cellulitis in the appropriate clinical setting. No deep fascial fluid. No well-defined or drainable fluid collections. No soft tissue gas. 2. No fracture or evidence of osteomyelitis. 3. No visible joint effusion at the right hip, knee, or ankle. Electronically Signed   By: Davina Poke M.D.   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 (07/06/19 1243)   vancomycin Stopped (07/04/19 1555)    amLODipine  10 mg Oral Daily   aspirin EC  81 mg Oral Daily   calcium acetate  1,334 mg Oral TID WC   carvedilol  25 mg Oral BID WC   Chlorhexidine Gluconate Cloth  6 each Topical Q0600   cloNIDine  0.1 mg Oral TID   epoetin (EPOGEN/PROCRIT) injection  10,000 Units Intravenous Q T,Th,Sa-HD   isosorbide mononitrate  30 mg Oral BID   torsemide  40 mg Oral Daily   sodium chloride, acetaminophen, diphenhydrAMINE, HYDROmorphone (DILAUDID) injection, hydrOXYzine, labetalol, ondansetron (ZOFRAN) IV, oxyCODONE-acetaminophen  Assessment/ Plan:  33 y.o.African [place] female with ESTD, HTN, DM, CHF, Anemia, Asthma, Chronic edema  Active Problems:   Sepsis due to cellulitis [facility])  [facility]/ [employer] TTS  #. ESRD with volume overload Recent Labs    07/03/19 0417 07/04/19 0416 07/05/19 0351 07/06/19 0440  CREATININE 13.43* 10.53* 12.22* 8.60*  HD UF only treatment today for volume removal Continue treatment with Sodium thiosulfate empirically Patient may be close to getting euvolemic Next hemodialysis planned for Monday Consider sitting in chair, ambulation   #. Anemia of CKD  Lab Results  Component Value Date   HGB 7.0 (L) 07/06/2019  EPO with HD on  TTS Will need to be monitored closely  #. SHPTH     Component Value Date/Time   PTH 217 (H) 02/11/2016 1317   Lab Results  Component Value Date   PHOS 6.1 (H) 07/06/2019   Monitor phos  #. HTN   BP control expected to improve with volume removal Now low.  Will discontinue clonidine and amlodipine If blood pressure starts to be elevated, will consider adding irbesartan at night  #. Diabetes type 2 with CKD Hgb A1c MFr Bld (%)  Date Value  07/04/2019 5.1   # Leg ulcers with chronic leg edema Differential includes cellulitis vs  calciphylaxis continue sodium thiosulfate empirically Also Getting cefepime, Vanc and Flagyl  Had surgical evaluation  Including CT of legs  Add gabapentin for pain control    LOS: [place] 9/19/20201:26 PM  [facility] [place], [place]

## 2019-07-06 NOTE — Progress Notes (Signed)
Patient returned from HD feeling nauseous. PRN Zofran given. Patient is now resting.   Fuller Mandril, RN

## 2019-07-07 LAB — CULTURE, BLOOD (ROUTINE X 2)
Culture: NO GROWTH
Culture: NO GROWTH
Special Requests: ADEQUATE

## 2019-07-07 LAB — BASIC METABOLIC PANEL
Anion gap: 34 — ABNORMAL HIGH (ref 5–15)
BUN: 60 mg/dL — ABNORMAL HIGH (ref 6–20)
CO2: 17 mmol/L — ABNORMAL LOW (ref 22–32)
Calcium: 9.2 mg/dL (ref 8.9–10.3)
Chloride: 94 mmol/L — ABNORMAL LOW (ref 98–111)
Creatinine, Ser: 10.6 mg/dL — ABNORMAL HIGH (ref 0.44–1.00)
GFR calc Af Amer: 5 mL/min — ABNORMAL LOW (ref >60)
GFR calc non Af Amer: 4 mL/min — ABNORMAL LOW (ref >60)
Glucose, Bld: 85 mg/dL (ref 70–99)
Potassium: 5.7 mmol/L — ABNORMAL HIGH (ref 3.5–5.1)
Sodium: 145 mmol/L (ref 135–145)

## 2019-07-07 LAB — CBC
HCT: 22.9 % — ABNORMAL LOW (ref 36.0–46.0)
Hemoglobin: 7.3 g/dL — ABNORMAL LOW (ref 12.0–15.0)
MCH: 27.5 pg (ref 26.0–34.0)
MCHC: 31.9 g/dL (ref 30.0–36.0)
MCV: 86.4 fL (ref 80.0–100.0)
Platelets: 382 10*3/uL (ref 150–400)
RBC: 2.65 MIL/uL — ABNORMAL LOW (ref 3.87–5.11)
RDW: 17.3 % — ABNORMAL HIGH (ref 11.5–15.5)
WBC: 25.6 10*3/uL — ABNORMAL HIGH (ref 4.0–10.5)
nRBC: 0 % (ref 0.0–0.2)

## 2019-07-07 LAB — POTASSIUM: Potassium: 5.4 mmol/L — ABNORMAL HIGH (ref 3.5–5.1)

## 2019-07-07 LAB — GLUCOSE, CAPILLARY: Glucose-Capillary: 83 mg/dL (ref 70–99)

## 2019-07-07 LAB — MRSA PCR SCREENING: MRSA by PCR: NEGATIVE

## 2019-07-07 MED ORDER — PATIROMER SORBITEX CALCIUM 8.4 G PO PACK
16.8000 g | PACK | Freq: Every day | ORAL | Status: DC
  Administered 2019-07-07 – 2019-07-10 (×4): 16.8 g via ORAL
  Filled 2019-07-07 (×4): qty 2

## 2019-07-07 NOTE — Progress Notes (Signed)
[facility] NAME: [name]    MR#:  [id number]  DATE OF BIRTH:  July 31, 1986  SUBJECTIVE:  CHIEF COMPLAINT:   Chief Complaint  Patient presents with  . Leg Swelling  . Bleeding/Bruising   Patient seen and evaluated today Tolerated PRBC transfusion well 2 days ago Due for dialysis tomorrow Decreased pain in the legs No chest pain No fever Close to 12 L of fluid removed by dialysis so far  REVIEW OF SYSTEMS:  ROS  Constitutional:  Negative for fever, malaise/fatigue and weight loss.  HENT: Negative for congestion, hearing loss and sore throat.   Eyes: Negative for blurred vision and double vision.  Respiratory: Negative for cough, shortness of breath and wheezing.   Cardiovascular: Positive for leg swelling and pain but improving. Negative for chest pain, palpitations and orthopnea.  Gastrointestinal: Negative for abdominal pain, diarrhea, nausea and vomiting.  Genitourinary: Negative for dysuria and urgency.  Musculoskeletal: Negative for myalgias.       Leg pain  Skin: Negative for rash.  Neurological: Negative for dizziness, sensory change, speech change, focal weakness and headaches.  Psychiatric/Behavioral: Negative for depression.   DRUG ALLERGIES:  No Known Allergies VITALS:  Blood pressure 125/82, pulse 93, temperature 98.2 F (36.8 C), temperature source Oral, resp. rate 17, height 5' 6"  (1.676 m), weight 131.1 kg, last menstrual period 06/05/2019, SpO2 95 %. PHYSICAL EXAMINATION:  Physical Exam  GENERAL:  33 y.o.-year-old patient lying in the bed with no acute distress.  EYES: Pupils equal, round, reactive to light and accommodation. No scleral icterus. Extraocular muscles intact.  HEENT: Head atraumatic, normocephalic. Oropharynx and nasopharynx clear.  NECK:  Supple, no jugular venous distention. No thyroid enlargement, no tenderness.  LUNGS: Normal breath sounds bilaterally, no wheezing,  rales,rhonchi or crepitation. No use of accessory muscles of respiration.  CARDIOVASCULAR: S1, S2 normal. No murmurs, rubs, or gallops.  ABDOMEN: Soft, nontender, nondistended. Bowel sounds present. No organomegaly or mass.  EXTREMITIES: Noted significant swelling and redness with tenderness to my palpation to both lower extremity.  Feels very less firm to touch.   Edema in the feet has improved  MUSCULOSKELETAL: Normal bulk, and power was 5+ grip and elbow, knee, and ankle flexion and extension bilaterally.  NEUROLOGIC:Alert and oriented x 3. CN 2-12 intact. Gait not tested due to safety concern. PSYCHIATRIC: The patient is alert and oriented x 3.  SKIN:  Areas of redness on both lower extremity between the knee joint extending towards the ankle joint.  Areas of healing blisters on both lower extremity.  LABORATORY PANEL:  Female CBC Recent Labs  Lab 07/07/19 0533  WBC 25.6*  HGB 7.3*  HCT 22.9*  PLT 382   ------------------------------------------------------------------------------------------------------------------ Chemistries  Recent Labs  Lab 07/06/19 0440 07/07/19 0533 07/07/19 0929  NA 140 145  --   K 4.6 5.7* 5.4*  CL 95* 94*  --   CO2 21* 17*  --   GLUCOSE 110* 85  --   BUN 43* 60*  --   CREATININE 8.60* 10.60*  --   CALCIUM 8.8* 9.2  --   MG 1.8  --   --    RADIOLOGY:  No results found. ASSESSMENT AND PLAN:   33 y.o. female with past medical history of ESR D on HD Tuesdays, Thursdays and Saturdays, hypertension, diabetes mellitus, CHF, anemia, and asthma presenting to the ED with chief complaints of bilateral leg swelling and pain.  1. Sepsis -secondary to bilateral  lower extremity cellulitis Improving slowly Has persistent leukocytosis Will get ID consult Patient on empiric antibiotics with IV vancomycin, cefepime and Flagyl So far no growth on blood cultures.   Leukocytosis persists.  No fevers noted. Ultrasound done with no evidence of DVT. There was  initial concern for possible compartment syndrome yesterday.  General surgery consulted.  CT of both lower extremity without contrast done on 07/04/2019 which revealed nonspecific circumferential soft tissue edema, most pronounced at the level of the mid right calf through the ankle. Findings can be seen in fluid overload settings as well as cellulitis in the appropriate clinical setting. No deep fascial fluid. No well-defined or drainable fluid collections. Appears to be improving with ongoing hemodialysis. Patient reevaluated by general surgeon .  Bilateral lower extremity cellulitis and edema improving with ongoing hemodialysis.  No indication for any surgical intervention at this time.  2.  ESRD on HD Tuesdays, Thursdays and Saturday -BUN/creatinine elevated above baseline likely due to missing dialysis  Patient had hemodialysis yesterday.  Also having hemodialysis today due to concern for fluid overload .Nephrology following. On sodium thiosulfate infusions at dialysis  3.  Acute on chronic systolicCongestive Heart Failure: Bilateral lower extremity edema Last Echo 01/2016 withEF 50-55% - Chest x-ray shows vascular congestion likely due to volume overload - Continue Demadex - Continue Imdur and Coreg -Continue inpatient hemodialysis. Low salt diet - Check daily weight - Strict I&Os -So far 12 L of fluid removed  4.HTN- stable Blood pressure controlled on current regimen  5. Diabetes Mellitus Type 2 with complications Glycosylated hemoglobin level less than 4.2.  Sliding scale insulin coverage  6. Anemia of CKD: Status post PRBC transfusion Hemoglobin around 7  7.  Hyperkalemia Start Veltassa as per nephrology   DVT prophylaxis - hold Heparin due to anemia requiring packed red blood cell transfusion  Patient cannot tolerate SCDs to both lower extremity due to severe pains  All the records are reviewed and case discussed with Care Management/Social Worker. Management  plans discussed with the patient, family and they are in agreement. Called and updated patient's mom; [name] [phone]).  She was updated on treatment plans as outlined above.  All questions were answered.  CODE STATUS: Full Code  TOTAL TIME TAKING CARE OF THIS PATIENT: 35 minutes.   More than 50% of the time was spent in counseling/coordination of care: YES  POSSIBLE D/C IN 3 DAYS, DEPENDING ON CLINICAL CONDITION.   Saundra Shelling M.D on 07/07/2019 at 10:56 AM  Between 7am to 6pm - Pager - [phone]  After 6pm go to [email or web address] - Proofreader  [facility]  Office  [phone]  CC: Primary care physician; [facility]  Note: This dictation was prepared with Dragon dictation along with smaller phrase technology. Any transcriptional errors that result from this process are unintentional.

## 2019-07-07 NOTE — Progress Notes (Signed)
[facility], [place] 07/07/19  Subjective:   LOS: 5 09/19 0701 - 09/20 0700 In: 1054.6 [I.V.:54.7; IV Piggyback:999.9] Out: [id number] [Urine:100] Patient presented to ER for worsening LE edema and painful ulcers PD catheter flushed 9/18 (friday)    So far 4300+3500+3500+3000 cc (14.3  L) removed with HD last 4 days Leg pain has improved with gabapentin Patient states she is able to ambulate without any problem Appetite is fair   Objective:  Vital signs in last 24 hours:  Temp:  [97.6 F (36.4 C)-98.8 F (37.1 C)] 98.2 F (36.8 C) (09/20 0444) Pulse Rate:  [91-98] 93 (09/20 0831) Resp:  [16-28] 17 (09/20 0444) BP: (95-140)/(58-86) 125/82 (09/20 0831) SpO2:  [95 %-100 %] 95 % (09/20 0444) Weight:  [131.1 kg] 131.1 kg (09/19 1040)  Weight change: 1.8 kg Filed Weights   07/05/19 0920 07/05/19 0926 07/06/19 1040  Weight: 129.3 kg 129.3 kg 131.1 kg    Intake/Output:    Intake/Output Summary (Last 24 hours) at 07/07/2019 0954 Last data filed at 07/07/2019 0444 Gross per 24 hour  Intake -  Output 3100 ml  Net -3100 ml    Gen:   Alert, cooperative, no distress, appears stated age Head:   Normocephalic, without obvious abnormality, atraumatic Eyes/ENT:  conjunctiva/corneas clear,  moist oral mucus membranes Neck:  Supple,  thyroid: not enlarged, no JVD Lungs:   Clear to auscultation bilaterally, respirations unlabored, O2 by [place] Heart:   Regular rate and rhythm, S1, S2 normal, no murmur, rub or gallop Abdomen:   Soft, non-tender  Extremities: no cyanosis; 2+  Edema;  Skin:  Skin color, texture, turgor normal, ulcerative lesions at back of calf  Neurologic: Alert and oriented, able to answer questions appropriately Access: Permacath, PD catheter in place         Basic Metabolic Panel:  Recent Labs  Lab 07/03/19 0417 07/04/19 0416 07/05/19 0351 07/06/19 0440 07/07/19 0533 07/07/19 0929  NA 138 138 138 140 145  --   K 4.5 4.4 4.6 4.6  5.7* 5.4*  CL 103 100 98 95* 94*  --   CO2 20* 23 19* 21* 17*  --   GLUCOSE 96 106* 97 110* 85  --   BUN 73* 55* 65* 43* 60*  --   CREATININE 13.43* 10.53* 12.22* 8.60* 10.60*  --   CALCIUM 7.7* 8.0* 8.1* 8.8* 9.2  --   MG 1.7  --  1.7 1.8  --   --   PHOS  --  7.8* 8.6* 6.1*  --   --      CBC: Recent Labs  Lab 07/03/19 0417 07/04/19 0416 07/05/19 0351 07/05/19 1425 07/05/19 1847 07/06/19 0440 07/07/19 0533  WBC 21.4* 16.8* 16.6*  --   --  20.7* 25.6*  HGB 7.5* 7.3* 6.5* 5.7* 7.9* 7.0* 7.3*  HCT 24.7* 23.5* 20.1*  --  23.5* 21.1* 22.9*  MCV 90.1 88.0 84.8  --   --  84.4 86.4  PLT 296 264 266  --   --  298 382      Lab Results  Component Value Date   HEPBSAG Negative 02/11/2016   HEPBSAB Non Reactive 02/11/2016      Microbiology:  Recent Results (from the past 240 hour(s))  Culture, blood (Routine x 2)     Status: None   Collection Time: 07/02/19  8:08 PM   Specimen: BLOOD  Result Value Ref Range Status   Specimen Description BLOOD LEFT ANTECUBITAL  Final   Special  Requests   Final    BOTTLES DRAWN AEROBIC AND ANAEROBIC Blood Culture results may not be optimal due to an excessive volume of blood received in culture bottles   Culture   Final    NO GROWTH 5 DAYS Performed at [facility], [address] [place], [place] [address]    Report Status 07/07/2019 FINAL  Final  Culture, blood (Routine x 2)     Status: None   Collection Time: 07/02/19  8:08 PM   Specimen: BLOOD  Result Value Ref Range Status   Specimen Description BLOOD BLOOD LEFT HAND  Final   Special Requests   Final    BOTTLES DRAWN AEROBIC AND ANAEROBIC Blood Culture adequate volume   Culture   Final    NO GROWTH 5 DAYS Performed at [facility], [address], [place], [place] [address]    Report Status 07/07/2019 FINAL  Final  SARS CORONAVIRUS 2 (TAT 6-24 HRS) Nasopharyngeal Nasopharyngeal Swab     Status: None   Collection Time: 07/02/19 10:48 PM   Specimen:  Nasopharyngeal Swab  Result Value Ref Range Status   SARS Coronavirus 2 NEGATIVE NEGATIVE Final    Comment: (NOTE) SARS-CoV-2 target nucleic acids are NOT DETECTED. The SARS-CoV-2 RNA is generally detectable in upper and lower respiratory specimens during the acute phase of infection. Negative results do not preclude SARS-CoV-2 infection, do not rule out co-infections with other pathogens, and should not be used as the sole basis for treatment or other patient management decisions. Negative results must be combined with clinical observations, patient history, and epidemiological information. The expected result is Negative. Fact Sheet for Patients: SugarRoll.be Fact Sheet for Healthcare Providers: https://www.woods-mathews.com/ This test is not yet approved or cleared by the [place] FDA and  has been authorized for detection and/or diagnosis of SARS-CoV-2 by FDA under an Emergency Use Authorization (EUA). This EUA will remain  in effect (meaning this test can be used) for the duration of the COVID-19 declaration under Section 56 4(b)(1) of the Act, 21 U.S.C. section 360bbb-3(b)(1), unless the authorization is terminated or revoked sooner. Performed at [facility], [place] [address] [place], [place] [address]     Coagulation Studies: No results for input(s): LABPROT, INR in the last 72 hours.  Urinalysis: No results for input(s): COLORURINE, LABSPEC, PHURINE, GLUCOSEU, HGBUR, BILIRUBINUR, KETONESUR, PROTEINUR, UROBILINOGEN, NITRITE, LEUKOCYTESUR in the last 72 hours.  Invalid input(s): APPERANCEUR    Imaging: No results found.   Medications:   . sodium chloride Stopped (07/05/19 0113)  . ceFEPime (MAXIPIME) IV 2 g (07/06/19 1438)  . metronidazole 500 mg (07/07/19 0835)  . sodium thiosulfate infusion for calciphylaxis Stopped (07/06/19 1343)  . vancomycin 1,000 mg (07/06/19 1553)   . aspirin EC  81 mg Oral Daily  .  calcium acetate  1,334 mg Oral TID WC  . carvedilol  25 mg Oral BID WC  . Chlorhexidine Gluconate Cloth  6 each Topical Q0600  . epoetin (EPOGEN/PROCRIT) injection  10,000 Units Intravenous Q T,Th,Sa-HD  . gabapentin  300 mg Oral QHS  . isosorbide mononitrate  30 mg Oral BID  . torsemide  40 mg Oral Daily   sodium chloride, acetaminophen, diphenhydrAMINE, HYDROmorphone (DILAUDID) injection, hydrOXYzine, labetalol, ondansetron (ZOFRAN) IV, oxyCODONE-acetaminophen  Assessment/ Plan:  33 y.o.[place] female with ESTD, HTN, DM, CHF, Anemia, Asthma, Chronic edema  Active Problems:   Sepsis due to cellulitis [facility])  [facility]/ [employer] TTS  #. ESRD with volume overload, hyperkalemia Recent Labs  07/04/19 0416 07/05/19 0351 07/06/19 0440 07/07/19 0533  CREATININE 10.53* 12.22* 8.60* 10.60*    Continue treatment with Sodium thiosulfate empirically Patient may be close to getting euvolemic- 14.3 L removed so far Next hemodialysis planned for Monday Consider sitting in chair, ambulation    #. Anemia of CKD  Lab Results  Component Value Date   HGB 7.3 (L) 07/07/2019  EPO with HD on TTS Will need to be monitored closely HD on Monday to correct hyperkalemia  #. SHPTH     Component Value Date/Time   PTH 217 (H) 02/11/2016 1317   Lab Results  Component Value Date   PHOS 6.1 (H) 07/06/2019   Monitor phos Improved Dietary counseling - avoid cheese/dailry  #. HTN   BP control expected to improve with volume removal Discontinued clonidine and amlodipine If blood pressure starts to be elevated, will consider adding irbesartan at night  #. Diabetes type 2 with CKD Hgb A1c MFr Bld (%)  Date Value  07/04/2019 5.1   # Leg ulcers with chronic leg edema Differential includes cellulitis vs calciphylaxis continue sodium thiosulfate empirically Also Getting cefepime, Vanc and Flagyl  Had surgical evaluation  Including CT of legs  gabapentin for pain control- has  helped    LOS: [place] 9/20/20209:54 AM  [place], [place]

## 2019-07-08 DIAGNOSIS — D631 Anemia in chronic kidney disease: Secondary | ICD-10-CM

## 2019-07-08 DIAGNOSIS — E1152 Type 2 diabetes mellitus with diabetic peripheral angiopathy with gangrene: Secondary | ICD-10-CM

## 2019-07-08 DIAGNOSIS — L918 Other hypertrophic disorders of the skin: Secondary | ICD-10-CM

## 2019-07-08 DIAGNOSIS — I82409 Acute embolism and thrombosis of unspecified deep veins of unspecified lower extremity: Secondary | ICD-10-CM

## 2019-07-08 DIAGNOSIS — N186 End stage renal disease: Secondary | ICD-10-CM

## 2019-07-08 DIAGNOSIS — I959 Hypotension, unspecified: Secondary | ICD-10-CM

## 2019-07-08 DIAGNOSIS — L989 Disorder of the skin and subcutaneous tissue, unspecified: Secondary | ICD-10-CM

## 2019-07-08 DIAGNOSIS — I120 Hypertensive chronic kidney disease with stage 5 chronic kidney disease or end stage renal disease: Secondary | ICD-10-CM

## 2019-07-08 DIAGNOSIS — E1122 Type 2 diabetes mellitus with diabetic chronic kidney disease: Secondary | ICD-10-CM

## 2019-07-08 DIAGNOSIS — Z79899 Other long term (current) drug therapy: Secondary | ICD-10-CM

## 2019-07-08 DIAGNOSIS — Z992 Dependence on renal dialysis: Secondary | ICD-10-CM

## 2019-07-08 DIAGNOSIS — L118 Other specified acantholytic disorders: Secondary | ICD-10-CM

## 2019-07-08 DIAGNOSIS — I96 Gangrene, not elsewhere classified: Secondary | ICD-10-CM

## 2019-07-08 LAB — CBC
HCT: 24.6 % — ABNORMAL LOW (ref 36.0–46.0)
Hemoglobin: 7.8 g/dL — ABNORMAL LOW (ref 12.0–15.0)
MCH: 27.4 pg (ref 26.0–34.0)
MCHC: 31.7 g/dL (ref 30.0–36.0)
MCV: 86.3 fL (ref 80.0–100.0)
Platelets: 472 10*3/uL — ABNORMAL HIGH (ref 150–400)
RBC: 2.85 MIL/uL — ABNORMAL LOW (ref 3.87–5.11)
RDW: 17.6 % — ABNORMAL HIGH (ref 11.5–15.5)
WBC: 25.1 10*3/uL — ABNORMAL HIGH (ref 4.0–10.5)
nRBC: 0 % (ref 0.0–0.2)

## 2019-07-08 LAB — BASIC METABOLIC PANEL
Anion gap: 34 — ABNORMAL HIGH (ref 5–15)
BUN: 75 mg/dL — ABNORMAL HIGH (ref 6–20)
CO2: 18 mmol/L — ABNORMAL LOW (ref 22–32)
Calcium: 9 mg/dL (ref 8.9–10.3)
Chloride: 88 mmol/L — ABNORMAL LOW (ref 98–111)
Creatinine, Ser: 12.41 mg/dL — ABNORMAL HIGH (ref 0.44–1.00)
GFR calc Af Amer: 4 mL/min — ABNORMAL LOW (ref >60)
GFR calc non Af Amer: 3 mL/min — ABNORMAL LOW (ref >60)
Glucose, Bld: 99 mg/dL (ref 70–99)
Potassium: 5.5 mmol/L — ABNORMAL HIGH (ref 3.5–5.1)
Sodium: 140 mmol/L (ref 135–145)

## 2019-07-08 MED ORDER — SODIUM THIOSULFATE 25 % IV SOLN: 25.0000 g | INTRAVENOUS | Status: DC

## 2019-07-08 MED ORDER — PROMETHAZINE HCL 25 MG/ML IJ SOLN: 12.5000 mg | Freq: Once | INTRAMUSCULAR | Status: DC

## 2019-07-08 MED ORDER — SODIUM THIOSULFATE 25 % IV SOLN
25.0000 g | INTRAVENOUS | Status: DC
  Filled 2019-07-08: qty 100

## 2019-07-08 MED ORDER — METRONIDAZOLE 500 MG PO TABS
500.0000 mg | ORAL_TABLET | Freq: Three times a day (TID) | ORAL | Status: DC
  Administered 2019-07-08: 16:00:00 500 mg via ORAL
  Filled 2019-07-08 (×3): qty 1

## 2019-07-08 NOTE — Progress Notes (Signed)
TREATMENT INITIATED FOR 3 HOURS REMOVAL OF 2L UF ONLY   07/08/19 0933  Vital Signs  Pulse Rate 86  Pulse Rate Source Monitor  Resp (!) 23  BP 137/89  BP Location Left Arm  BP Method Automatic  Patient Position (if appropriate) Lying  Oxygen Therapy  SpO2 100 %  During Hemodialysis Assessment  Blood Flow Rate (mL/min) 150 mL/min  Arterial Pressure (mmHg) -60 mmHg  Venous Pressure (mmHg) 20 mmHg  Transmembrane Pressure (mmHg) 10 mmHg  Ultrafiltration Rate (mL/min) 840 mL/min  Dialysate Flow Rate (mL/min) 600 ml/min  Conductivity: Machine  14  Dialysis Fluid Bolus Normal Saline  Bolus Amount (mL) 250 mL  Intra-Hemodialysis Comments Tx initiated

## 2019-07-08 NOTE — Consult Note (Signed)
NAME: [name]  DOB: [birth year]  MRN: [id number]  Date/Time: 07/08/2019 6:45 PM  REQUESTING PROVIDER: Dr. Estanislado Pandy Subjective:  REASON FOR CONSULT: Cellulitis legs ?History from patient.  Mom at bedside.  Chart reviewed. [name] is a 33 y.o. female with a history of end-stage renal disease on dialysis since May 2020, hypertension, diabetes mellitus, anemia of chronic disease is admitted with 1 history of painful calfs bilaterally and 1 week history of skin changes in the calf area.  Patient has history of focal and segmental granular sclerosis as evidenced on renal biopsy March 2017.  She has uncontrolled hypertension.  In May 2020 she had a tunneled catheter placed for dialysis and started dialysis hemodialysis.  She also had a peritoneal dialysis catheter placed sometime recently but has not been in use yet.  For the last 1 month she has had painful swelling of both her calf muscles.  The left worse than the right.  The past week she has noted some discoloration of the skin and it has become even more painful.  She does not have any fever.  She denies any insect bites. She is not on blood thinners like Coumadin She does not use cocaine. She has no history of lupus or antiphospholipid antibody syndrome.  She has known DVT She thinks she may have had a miscarriage sometime ago. Patient is currently on vancomycin, cefepime and Flagyl and I am asked to see the patientFor possible cellulitis and persistent leukocytosis. Patient works as a Freight forwarder in [employer].  She recently was Ali in [place].    Past Medical History:  Diagnosis Date  . Anemia of chronic disease   . Asthma   . CHF (congestive heart failure) ([place])   . CKD (chronic kidney disease), stage III ([place])   . Diabetes ([place])    type 2  . Hypertension   . Morbid obesity ([place])     Past Surgical History:  Procedure Laterality Date  . DIALYSIS/PERMA CATHETER INSERTION N/A 02/28/2019   Procedure: DIALYSIS/PERMA  CATHETER INSERTION;  Surgeon: Algernon Huxley, MD;  Location: [facility];  Service: Cardiovascular;  Laterality: N/A;  . TONSILLECTOMY    Social history Patient is single She has no children She lives on her own.  says her brother lives with her as well. Non-smoker, no illicit drug use  Family History  Problem Relation Age of Onset  . Hypertension Mother   . Multiple sclerosis Mother   . Cancer Maternal Grandmother        Ovarian   . Cancer Maternal Grandfather        Prostate   Dialysis in grandmother Father has ESRD -on dilaysis No Known Allergies  ? Current Facility-Administered Medications  Medication Dose Route Frequency Provider Last Rate Last Dose  . 0.9 %  sodium chloride infusion   Intravenous PRN Dustin Flock, MD 10 mL/hr at 07/07/19 2249 250 mL at 07/07/19 2249  . acetaminophen (TYLENOL) tablet 650 mg  650 mg Oral TID PRN Stark Jock, Jude, MD   650 mg at 07/05/19 1729  . aspirin EC tablet 81 mg  81 mg Oral Daily Lang Snow, NP   81 mg at 07/08/19 0836  . calcium acetate (PHOSLO) capsule 1,334 mg  1,334 mg Oral TID WC Lang Snow, NP   1,334 mg at 07/08/19 1707  . carvedilol (COREG) tablet 25 mg  25 mg Oral BID WC Lang Snow, NP   25 mg at 07/08/19 1707  . ceFEPIme (  MAXIPIME) 2 g in sodium chloride 0.9 % 100 mL IVPB  2 g Intravenous Q T,Th,Sa-HD Dallie Piles, RPH 200 mL/hr at 07/06/19 1438 2 g at 07/06/19 1438  . Chlorhexidine Gluconate Cloth 2 % PADS 6 each  6 each Topical Q0600 Murlean Iba, MD   6 each at 07/08/19 0600  . diphenhydrAMINE (BENADRYL) capsule 25 mg  25 mg Oral Q8H PRN Dustin Flock, MD   25 mg at 07/07/19 2256  . epoetin alfa (EPOGEN) injection 10,000 Units  10,000 Units Intravenous Q T,Th,Sa-HD Murlean Iba, MD   10,000 Units at 07/06/19 1134  . gabapentin (NEURONTIN) capsule 300 mg  300 mg Oral QHS Murlean Iba, MD   300 mg at 07/07/19 2251  . HYDROmorphone (DILAUDID) injection 1 mg  1 mg Intravenous  Q4H PRN Dustin Flock, MD   1 mg at 07/08/19 0844  . hydrOXYzine (ATARAX/VISTARIL) tablet 25 mg  25 mg Oral BID PRN Lang Snow, NP      . isosorbide mononitrate (IMDUR) 24 hr tablet 30 mg  30 mg Oral BID Lang Snow, NP   30 mg at 07/08/19 0835  . labetalol (NORMODYNE) injection 10 mg  10 mg Intravenous Q2H PRN Lang Snow, NP   10 mg at 07/03/19 0515  . metroNIDAZOLE (FLAGYL) tablet 500 mg  500 mg Oral Q8H Lu Duffel, RPH   500 mg at 07/08/19 1533  . ondansetron (ZOFRAN) injection 4 mg  4 mg Intravenous Q6H PRN Loletha Grayer, MD   4 mg at 07/08/19 1105  . oxyCODONE-acetaminophen (PERCOCET) 7.5-325 MG per tablet 1 tablet  1 tablet Oral Q6H PRN Dustin Flock, MD   1 tablet at 07/07/19 0103  . patiromer Daryll Drown) packet 16.8 g  16.8 g Oral Daily Pyreddy, Reatha Harps, MD   16.8 g at 07/08/19 0839  . [START ON 07/09/2019] sodium thiosulfate 25 g in sodium chloride 0.9 % 200 mL Infusion for Calciphylaxis  25 g Intravenous Q T,Th,Sa-HD Shanlever, Pierce Crane, RPH      . torsemide [facility]) tablet 40 mg  40 mg Oral Daily Lang Snow, NP   40 mg at 07/08/19 0836  . vancomycin (VANCOCIN) IVPB 1000 mg/200 mL premix  1,000 mg Intravenous Q T,Th,Sa-HD Dallie Piles, RPH 200 mL/hr at 07/06/19 1553 1,000 mg at 07/06/19 1553     Abtx:  Anti-infectives (From admission, onward)   Start     Dose/Rate Route Frequency Ordered Stop   07/08/19 1415  metroNIDAZOLE (FLAGYL) tablet 500 mg     500 mg Oral Every 8 hours 07/08/19 1409     07/05/19 1800  vancomycin (VANCOCIN) IVPB 1000 mg/200 mL premix     1,000 mg 200 mL/hr over 60 Minutes Intravenous  Once 07/05/19 1219 07/06/19 0700   07/05/19 1600  ceFEPIme (MAXIPIME) 2 g in sodium chloride 0.9 % 100 mL IVPB     2 g 200 mL/hr over 30 Minutes Intravenous  Once 07/05/19 1219 07/06/19 1550   07/04/19 1200  vancomycin (VANCOCIN) IVPB 1000 mg/200 mL premix     1,000 mg 200 mL/hr over 60 Minutes Intravenous  Every T-Th-Sa (Hemodialysis) 07/03/19 0839     07/04/19 1200  ceFEPIme (MAXIPIME) 2 g in sodium chloride 0.9 % 100 mL IVPB     2 g 200 mL/hr over 30 Minutes Intravenous Every T-Th-Sa (Hemodialysis) 07/03/19 0839     07/02/19 2330  metroNIDAZOLE (FLAGYL) IVPB 500 mg  Status:  Discontinued     500 mg 100 mL/hr  over 60 Minutes Intravenous Every 8 hours 07/02/19 2245 07/08/19 1409   07/02/19 2300  ceFEPIme (MAXIPIME) 2 g in sodium chloride 0.9 % 100 mL IVPB  Status:  Discontinued     2 g 200 mL/hr over 30 Minutes Intravenous  Once 07/02/19 2245 07/02/19 2252   07/02/19 2300  vancomycin (VANCOCIN) IVPB 1000 mg/200 mL premix  Status:  Discontinued     1,000 mg 200 mL/hr over 60 Minutes Intravenous  Once 07/02/19 2245 07/02/19 2251   07/02/19 2030  ceFEPIme (MAXIPIME) 2 g in sodium chloride 0.9 % 100 mL IVPB     2 g 200 mL/hr over 30 Minutes Intravenous  Once 07/02/19 2019 07/02/19 2134   07/02/19 2030  metroNIDAZOLE (FLAGYL) IVPB 500 mg     500 mg 100 mL/hr over 60 Minutes Intravenous  Once 07/02/19 2019 07/02/19 2316   07/02/19 2030  vancomycin (VANCOCIN) IVPB 1000 mg/200 mL premix  Status:  Discontinued     1,000 mg 200 mL/hr over 60 Minutes Intravenous  Once 07/02/19 2019 07/02/19 2021   07/02/19 2030  vancomycin (VANCOCIN) 2,500 mg in sodium chloride 0.9 % 500 mL IVPB     2,500 mg 250 mL/hr over 120 Minutes Intravenous  Once 07/02/19 2021 07/03/19 0007      REVIEW OF SYSTEMS:  Const: negative fever, negative chills, negative weight loss Eyes: negative diplopia or visual changes, negative eye pain ENT: negative coryza, negative sore throat Resp: negative cough, hemoptysis, dyspnea Cards: negative for chest pain, palpitations, lower extremity edema GU: negative for frequency, dysuria and hematuria GI: Negative for abdominal pain, diarrhea, bleeding, constipation Skin: negative for rash and pruritus Heme: negative for easy bruising and gum/nose bleeding MS: negative for myalgias,  arthralgias, back pain and muscle weakness Neurolo:negative for headaches, dizziness, vertigo, memory problems  Psych: negative for feelings of anxiety, depression  Endocrine: negative for thyroid, diabetes Allergy/Immunology- negative for any medication or food allergies ? Pertinent Positives include : Objective:  VITALS:  BP (!) 142/79   Pulse 97   Temp 98.2 F (36.8 C) (Oral)   Resp 17   Ht 5\' 6"  (1.676 m)   Wt 131.1 kg   LMP 06/05/2019   SpO2 100%   BMI 46.65 kg/m  PHYSICAL EXAM:  General: Little lethargic from the pain medication but oriented x5 and responding appropriately to questions., cooperative, no distress, appears stated age.  Obese Head: Normocephalic, without obvious abnormality, atraumatic. Eyes: Conjunctivae clear, anicteric sclerae. Pupils are equal ENT Nares normal. No drainage or sinus tenderness. Lips, mucosa, and tongue normal. No Thrush Neck: Supple, symmetrical, no adenopathy, thyroid: non tender no carotid bruit and no JVD. Back: No CVA tenderness. Lungs: Clear to auscultation bilaterally. No Wheezing or Rhonchi. No rales. Chest wall right-sided dialysis catheter Heart: Regular rate and rhythm, no murmur, rub or gallop. Abdomen: Soft, non-tender,not distended. Bowel sounds normal. No masses.  Peritoneal catheter present on the right side. Extremities: Bilateral tender indurated swelling of the calf area left more than right.  Area of superficial ischemic necrosis of the skin       skin: Skin tags, acanthosis nigricans over the neck  Lymph: Cervical, supraclavicular normal. Neurologic: Grossly non-focal Pertinent Labs Lab Results CBC    Component Value Date/Time   WBC 25.1 (H) 07/08/2019 0514   RBC 2.85 (L) 07/08/2019 0514   HGB 7.8 (L) 07/08/2019 0514   HGB 12.5 12/31/2013 1020   HCT 24.6 (L) 07/08/2019 0514   HCT 38.5 12/31/2013 1020   PLT 472 (  H) 07/08/2019 0514   PLT 239 12/31/2013 1020   MCV 86.3 07/08/2019 0514   MCV 84 12/31/2013  1020   MCH 27.4 07/08/2019 0514   MCHC 31.7 07/08/2019 0514   RDW 17.6 (H) 07/08/2019 0514   RDW 15.2 (H) 12/31/2013 1020   LYMPHSABS 2.6 05/25/2017 0958   MONOABS 0.8 05/25/2017 0958   EOSABS 0.2 05/25/2017 0958   BASOSABS 0.0 05/25/2017 0958    CMP Latest Ref Rng & Units 07/08/2019 07/07/2019 07/07/2019  Glucose 70 - 99 mg/dL 99 - 85  BUN 6 - 20 mg/dL 75(H) - 60(H)  Creatinine 0.44 - 1.00 mg/dL 12.41(H) - 10.60(H)  Sodium 135 - 145 mmol/L 140 - 145  Potassium 3.5 - 5.1 mmol/L 5.5(H) 5.4(H) 5.7(H)  Chloride 98 - 111 mmol/L 88(L) - 94(L)  CO2 22 - 32 mmol/L 18(L) - 17(L)  Calcium 8.9 - 10.3 mg/dL 9.0 - 9.2  Total Protein 6.5 - 8.1 g/dL - - -  Total Bilirubin 0.3 - 1.2 mg/dL - - -  Alkaline Phos 38 - 126 U/L - - -  AST 15 - 41 U/L - - -  ALT 14 - 54 U/L - - -    Microbiology: Recent Results (from the past 240 hour(s))  Culture, blood (Routine x 2)     Status: None   Collection Time: 07/02/19  8:08 PM   Specimen: BLOOD  Result Value Ref Range Status   Specimen Description BLOOD LEFT ANTECUBITAL  Final   Special Requests   Final    BOTTLES DRAWN AEROBIC AND ANAEROBIC Blood Culture results may not be optimal due to an excessive volume of blood received in culture bottles   Culture   Final    NO GROWTH 5 DAYS Performed at [facility], [address] [place], [place] [address]    Report Status 07/07/2019 FINAL  Final  Culture, blood (Routine x 2)     Status: None   Collection Time: 07/02/19  8:08 PM   Specimen: BLOOD  Result Value Ref Range Status   Specimen Description BLOOD BLOOD LEFT HAND  Final   Special Requests   Final    BOTTLES DRAWN AEROBIC AND ANAEROBIC Blood Culture adequate volume   Culture   Final    NO GROWTH 5 DAYS Performed at [facility], [address] [place], [place] [address]    Report Status 07/07/2019 FINAL  Final  SARS CORONAVIRUS 2 (TAT 6-24 HRS) Nasopharyngeal Nasopharyngeal Swab     Status: None   Collection Time:  07/02/19 10:48 PM   Specimen: Nasopharyngeal Swab  Result Value Ref Range Status   SARS Coronavirus 2 NEGATIVE NEGATIVE Final    Comment: (NOTE) SARS-CoV-2 target nucleic acids are NOT DETECTED. The SARS-CoV-2 RNA is generally detectable in upper and lower respiratory specimens during the acute phase of infection. Negative results do not preclude SARS-CoV-2 infection, do not rule out co-infections with other pathogens, and should not be used as the sole basis for treatment or other patient management decisions. Negative results must be combined with clinical observations, patient history, and epidemiological information. The expected result is Negative. Fact Sheet for Patients: SugarRoll.be Fact Sheet for Healthcare Providers: https://www.woods-mathews.com/ This test is not yet approved or cleared by the [place] FDA and  has been authorized for detection and/or diagnosis of SARS-CoV-2 by FDA under an Emergency Use Authorization (EUA). This EUA will remain  in effect (meaning this test can be used) for the duration of the COVID-19 declaration under  Section 56 4(b)(1) of the Act, 21 U.S.C. section 360bbb-3(b)(1), unless the authorization is terminated or revoked sooner. Performed at [facility], [place] [address] [place], [place] [address]   MRSA PCR Screening     Status: None   Collection Time: 07/07/19  8:41 AM   Specimen: Nasopharyngeal  Result Value Ref Range Status   MRSA by PCR NEGATIVE NEGATIVE Final    Comment:        The GeneXpert MRSA Assay (FDA approved for NASAL specimens only), is one component of a comprehensive MRSA colonization surveillance program. It is not intended to diagnose MRSA infection nor to guide or monitor treatment for MRSA infections. Performed at [facility], [address] [place], [place] [address]     IMAGING RESULTS: Nonspecific circumferential soft tissue edema, most  pronounced at the level of the proximal left calf through the ankle. Findings can be seen in fluid overload settings as well as cellulitis in the appropriate clinical setting. No deep fascial fluid. No well-defined or drainable fluid collections. No soft tissue gas I have personally reviewed the films ? Impression/Recommendation ? Bilateral cysts calf lesions with soft tissue induration and swelling with superficial skin necrosis.  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  Will need biopsy.  Differential diagnosis includes Antiphospholipid antibody syndrome Fat embolism Cocaine cut with levamisole but in her case unlikely as she does not use cocaine. Coumadin induced necrosis unlikely as she has not been on Coumadin. May need full-thickness biopsy  She is currently on vancomycin, cefepime and Flagyl even though clinically there is no evidence of infection but she is at risk for an infection.  We will DC the metronidazole.  May be able to de-escalate soon. Patient has been started on sodium thiosulfate by nephrologist. ? ?End-stage renal disease on dialysis  Hypotension on torsemide, isosorbide mononitrate, carvedilol  Diabetes mellitus used to be on insulin but not anymore since she developed end-stage renal disease.  Tight control. ___________________________________________________ Discussed with patient and her mother.  Also discussed with nephrologist. Note:  This document was prepared using Dragon voice recognition software and may include unintentional dictation errors.

## 2019-07-08 NOTE — Care Management Important Message (Signed)
Important Message  Patient Details  Name: [name] MRN: [id number] Date of Birth: Oct 21, 1985   Medicare Important Message Given:  Yes     Dannette Barbara 07/08/2019, 11:22 AM

## 2019-07-08 NOTE — Progress Notes (Signed)
PHARMACIST - PHYSICIAN COMMUNICATION DR:   Estanislado Pandy CONCERNING: Antibiotic IV to Oral Route Change Policy  RECOMMENDATION: This patient is receiving metronidazole by the intravenous route.  Based on criteria approved by the Pharmacy and Therapeutics Committee, the antibiotic(s) is/are being converted to the equivalent oral dose form(s).   DESCRIPTION: These criteria include:  Patient being treated for a respiratory tract infection, urinary tract infection, cellulitis or clostridium difficile associated diarrhea if on metronidazole  The patient is not neutropenic and does not exhibit a GI malabsorption state  The patient is eating (either orally or via tube) and/or has been taking other orally administered medications for a least 24 hours  The patient is improving clinically and has a Tmax < 100.5  If you have questions about this conversion, please contact the [place], PharmD, BCPS Clinical Pharmacist 07/08/2019 2:10 PM

## 2019-07-08 NOTE — Progress Notes (Signed)
[facility], [place] 07/08/19  Subjective:  Patient seen and evaluated during hemodialysis. Tolerating well. Potassium noted to be high at 5.5.    Objective:  Vital signs in last 24 hours:  Temp:  [97.6 F (36.4 C)-98.6 F (37 C)] 98.2 F (36.8 C) (09/21 0915) Pulse Rate:  [84-93] 84 (09/21 1030) Resp:  [15-25] 16 (09/21 1030) BP: (122-141)/(73-91) 127/76 (09/21 1030) SpO2:  [97 %-100 %] 99 % (09/21 1030)  Weight change:  Filed Weights   07/05/19 0920 07/05/19 0926 07/06/19 1040  Weight: 129.3 kg 129.3 kg 131.1 kg    Intake/Output:    Intake/Output Summary (Last 24 hours) at 07/08/2019 1114 Last data filed at 07/07/2019 2251 Gross per 24 hour  Intake 120 ml  Output 200 ml  Net -80 ml    Gen:   Awake, alert, no acute distress Head:   Normocephalic, without obvious abnormality, atraumatic Eyes/ENT:  conjunctiva/corneas clear,  moist oral mucus membranes Neck:  Supple Lungs:   Clear to auscultation bilaterally, normal effort Heart:   Regular rate and rhythm, S1, S2 normal, no murmur, rub or gallop Abdomen:   Soft, non-tender  Extremities: no cyanosis; 2+  Edema;  Skin:  Skin color, texture, turgor normal, ulcerative lesions at back of calf  Neurologic: Alert and oriented, able to answer questions appropriately Access:           Permacath, PD catheter in place         Basic Metabolic Panel:  Recent Labs  Lab 07/03/19 0417 07/04/19 0416 07/05/19 0351 07/06/19 0440 07/07/19 0533 07/07/19 0929 07/08/19 0514  NA 138 138 138 140 145  --  140  K 4.5 4.4 4.6 4.6 5.7* 5.4* 5.5*  CL 103 100 98 95* 94*  --  88*  CO2 20* 23 19* 21* 17*  --  18*  GLUCOSE 96 106* 97 110* 85  --  99  BUN 73* 55* 65* 43* 60*  --  75*  CREATININE 13.43* 10.53* 12.22* 8.60* 10.60*  --  12.41*  CALCIUM 7.7* 8.0* 8.1* 8.8* 9.2  --  9.0  MG 1.7  --  1.7 1.8  --   --   --   PHOS  --  7.8* 8.6* 6.1*  --   --   --      CBC: Recent Labs  Lab 07/04/19  0416 07/05/19 0351 07/05/19 1425 07/05/19 1847 07/06/19 0440 07/07/19 0533 07/08/19 0514  WBC 16.8* 16.6*  --   --  20.7* 25.6* 25.1*  HGB 7.3* 6.5* 5.7* 7.9* 7.0* 7.3* 7.8*  HCT 23.5* 20.1*  --  23.5* 21.1* 22.9* 24.6*  MCV 88.0 84.8  --   --  84.4 86.4 86.3  PLT 264 266  --   --  298 382 472*      Lab Results  Component Value Date   HEPBSAG Negative 02/11/2016   HEPBSAB Non Reactive 02/11/2016      Microbiology:  Recent Results (from the past 240 hour(s))  Culture, blood (Routine x 2)     Status: None   Collection Time: 07/02/19  8:08 PM   Specimen: BLOOD  Result Value Ref Range Status   Specimen Description BLOOD LEFT ANTECUBITAL  Final   Special Requests   Final    BOTTLES DRAWN AEROBIC AND ANAEROBIC Blood Culture results may not be optimal due to an excessive volume of blood received in culture bottles   Culture   Final    NO GROWTH 5 DAYS Performed  at [facility], [address] [place], [place] [address]    Report Status 07/07/2019 FINAL  Final  Culture, blood (Routine x 2)     Status: None   Collection Time: 07/02/19  8:08 PM   Specimen: BLOOD  Result Value Ref Range Status   Specimen Description BLOOD BLOOD LEFT HAND  Final   Special Requests   Final    BOTTLES DRAWN AEROBIC AND ANAEROBIC Blood Culture adequate volume   Culture   Final    NO GROWTH 5 DAYS Performed at [facility], [address] [place], [place] [address]    Report Status 07/07/2019 FINAL  Final  SARS CORONAVIRUS 2 (TAT 6-24 HRS) Nasopharyngeal Nasopharyngeal Swab     Status: None   Collection Time: 07/02/19 10:48 PM   Specimen: Nasopharyngeal Swab  Result Value Ref Range Status   SARS Coronavirus 2 NEGATIVE NEGATIVE Final    Comment: (NOTE) SARS-CoV-2 target nucleic acids are NOT DETECTED. The SARS-CoV-2 RNA is generally detectable in upper and lower respiratory specimens during the acute phase of infection. Negative results do not preclude SARS-CoV-2  infection, do not rule out co-infections with other pathogens, and should not be used as the sole basis for treatment or other patient management decisions. Negative results must be combined with clinical observations, patient history, and epidemiological information. The expected result is Negative. Fact Sheet for Patients: SugarRoll.be Fact Sheet for Healthcare Providers: https://www.woods-mathews.com/ This test is not yet approved or cleared by the [place] FDA and  has been authorized for detection and/or diagnosis of SARS-CoV-2 by FDA under an Emergency Use Authorization (EUA). This EUA will remain  in effect (meaning this test can be used) for the duration of the COVID-19 declaration under Section 56 4(b)(1) of the Act, 21 U.S.C. section 360bbb-3(b)(1), unless the authorization is terminated or revoked sooner. Performed at [facility], [place] [address], [place], [place] [address]   MRSA PCR Screening     Status: None   Collection Time: 07/07/19  8:41 AM   Specimen: Nasopharyngeal  Result Value Ref Range Status   MRSA by PCR NEGATIVE NEGATIVE Final    Comment:        The GeneXpert MRSA Assay (FDA approved for NASAL specimens only), is one component of a comprehensive MRSA colonization surveillance program. It is not intended to diagnose MRSA infection nor to guide or monitor treatment for MRSA infections. Performed at [facility], [address] [place], [place] [address]     Coagulation Studies: No results for input(s): LABPROT, INR in the last 72 hours.  Urinalysis: No results for input(s): COLORURINE, LABSPEC, PHURINE, GLUCOSEU, HGBUR, BILIRUBINUR, KETONESUR, PROTEINUR, UROBILINOGEN, NITRITE, LEUKOCYTESUR in the last 72 hours.  Invalid input(s): APPERANCEUR    Imaging: No results found.   Medications:   . sodium chloride 250 mL (07/07/19 2249)  . ceFEPime (MAXIPIME) IV 2 g (07/06/19 1438)  .  metronidazole 500 mg (07/08/19 0839)  . sodium thiosulfate infusion for calciphylaxis Stopped (07/06/19 1343)  . vancomycin 1,000 mg (07/06/19 1553)   . aspirin EC  81 mg Oral Daily  . calcium acetate  1,334 mg Oral TID WC  . carvedilol  25 mg Oral BID WC  . Chlorhexidine Gluconate Cloth  6 each Topical Q0600  . epoetin (EPOGEN/PROCRIT) injection  10,000 Units Intravenous Q T,Th,Sa-HD  . gabapentin  300 mg Oral QHS  . isosorbide mononitrate  30 mg Oral BID  . patiromer  16.8 g Oral Daily  .  torsemide  40 mg Oral Daily   sodium chloride, acetaminophen, diphenhydrAMINE, HYDROmorphone (DILAUDID) injection, hydrOXYzine, labetalol, ondansetron (ZOFRAN) IV, oxyCODONE-acetaminophen  Assessment/ Plan:  33 y.o.African [place] female with ESTD, HTN, DM, CHF, Anemia, Asthma, Chronic edema  Active Problems:   Sepsis due to cellulitis [facility])  [facility]/ [employer] TTS  #. ESRD with volume overload, hyperkalemia Recent Labs    07/05/19 0351 07/06/19 0440 07/07/19 0533 07/08/19 0514  CREATININE 12.22* 8.60* 10.60* 12.41*    Patient seen and evaluated during hemodialysis.  We plan to continue sodium thiosulfate at this time.  Continue ultrafiltration with dialysis sessions.  Patient in dialysis mode given hyperkalemia.    #. Anemia of CKD  Lab Results  Component Value Date   HGB 7.8 (L) 07/08/2019  Continue Epogen 10,000 units on TTS.  #. SHPTH     Component Value Date/Time   PTH 217 (H) 02/11/2016 1317   Lab Results  Component Value Date   PHOS 6.1 (H) 07/06/2019   Most recent serum phosphorus was 6.1.  Continue to periodically monitor.  Continue calcium acetate for now  #. HTN   Maintain the patient on carvedilol for now.  #. Diabetes type 2 with CKD Hgb A1c MFr Bld (%)  Date Value  07/04/2019 5.1   # Leg ulcers with chronic leg edema Differential includes cellulitis vs calciphylaxis continue sodium thiosulfate empirically Also Getting cefepime, Vanc and Flagyl       LOS: 6 Isaid Salvia 9/21/202011:14 AM  [place], [place]

## 2019-07-08 NOTE — Progress Notes (Signed)
TREATMENT COMPLETED, NAUSEA AND VOMITING NOTED DURING TREATMENT V/S REMAINED WNL, ZOFRAN GIVEN REMOVED 2L OF FLUID, FILTRATION ONLY CHANGED TO HD DURING MID TREATMENT TO CORRECT HYPERKALEMIA, NO OTHER PROBLEMS TO NOTE   07/08/19 1236  Vital Signs  Temp 98.2 F (36.8 C)  Temp Source Oral  Pulse Rate (!) 109  Pulse Rate Source Monitor  Resp 19  BP 109/77  BP Location Left Arm  BP Method Automatic  Patient Position (if appropriate) Lying  Oxygen Therapy  SpO2 96 %  Pain Assessment  Pain Scale 0-10  Pain Score 0  During Hemodialysis Assessment  Blood Flow Rate (mL/min) 150 mL/min  Arterial Pressure (mmHg) -10 mmHg  Venous Pressure (mmHg) 110 mmHg  Transmembrane Pressure (mmHg) 40 mmHg  Ultrafiltration Rate (mL/min) 0 mL/min  Dialysate Flow Rate (mL/min) 600 ml/min  Conductivity: Machine  14.2  HD Safety Checks Performed Yes  KECN 42.2 KECN  Dialysis Fluid Bolus Normal Saline  Bolus Amount (mL) 250 mL  Intra-Hemodialysis Comments Tolerated well;Tx completed  Post-Hemodialysis Assessment  Rinseback Volume (mL) 250 mL  KECN 42.2 V  Dialyzer Clearance Lightly streaked  Duration of HD Treatment -hour(s) 3 hour(s)  Hemodialysis Intake (mL) 500 mL  UF Total -Machine (mL) 2500 mL  Net UF (mL) 2000 mL  Tolerated HD Treatment Yes  Post-Hemodialysis Comments tolerated well no c/o  Education / Care Plan  Dialysis Education Provided Yes  Documented Education in Care Plan Yes  Hemodialysis Catheter Right Internal jugular Double-lumen  Placement Date: 02/28/19   Time Out: Correct patient;Correct site;Correct procedure  Maximum sterile barrier precautions: Hand hygiene;Cap;Mask;Large sterile sheet;Sterile gloves;Sterile gown  Site Prep: Chlorhexidine  Local Anesthetic: Injectable - 1...  Site Condition No complications  Blue Lumen Status Flushed;Heparin locked  Red Lumen Status Flushed;Heparin locked  Catheter fill solution Heparin 1000 units/ml  Catheter fill volume (Arterial) 1.7 cc   Catheter fill volume (Venous) 1.7  Dressing Type Biopatch  Dressing Status Clean;Dry;Intact;Dressing changed  Interventions New dressing  Drainage Description None  Dressing Change Due 07/15/19  Post treatment catheter status Capped and Clamped

## 2019-07-08 NOTE — Progress Notes (Signed)
[place] at [place] NAME: [name]    MR#:  [id number]  DATE OF BIRTH:  [birth year]  SUBJECTIVE:  CHIEF COMPLAINT:   Chief Complaint  Patient presents with  . Leg Swelling  . Bleeding/Bruising   Patient seen and evaluated today Tolerated PRBC transfusion well 2 days ago Dialysis today Decreased pain in the legs No chest pain No fever Close to 12 L of fluid removed by dialysis so far  REVIEW OF SYSTEMS:  ROS  Constitutional:  Negative for fever, malaise/fatigue and weight loss.  HENT: Negative for congestion, hearing loss and sore throat.   Eyes: Negative for blurred vision and double vision.  Respiratory: Negative for cough, shortness of breath and wheezing.   Cardiovascular: Positive for leg swelling and pain but improving. Negative for chest pain, palpitations and orthopnea.  Gastrointestinal: Negative for abdominal pain, diarrhea, nausea and vomiting.  Genitourinary: Negative for dysuria and urgency.  Musculoskeletal: Negative for myalgias.       Leg pain  Skin: Negative for rash.  Neurological: Negative for dizziness, sensory change, speech change, focal weakness and headaches.  Psychiatric/Behavioral: Negative for depression.   DRUG ALLERGIES:  No Known Allergies VITALS:  Blood pressure 132/83, pulse (!) 101, temperature 98.2 F (36.8 C), temperature source Oral, resp. rate 19, height 5' 6"  (1.676 m), weight 131.1 kg, last menstrual period 06/05/2019, SpO2 93 %. PHYSICAL EXAMINATION:  Physical Exam  GENERAL:  33 y.o.-year-old patient lying in the bed with no acute distress.  EYES: Pupils equal, round, reactive to light and accommodation. No scleral icterus. Extraocular muscles intact.  HEENT: Head atraumatic, normocephalic. Oropharynx and nasopharynx clear.  NECK:  Supple, no jugular venous distention. No thyroid enlargement, no tenderness.  LUNGS: Normal breath sounds bilaterally, no wheezing, rales,rhonchi or  crepitation. No use of accessory muscles of respiration.  CARDIOVASCULAR: S1, S2 normal. No murmurs, rubs, or gallops.  ABDOMEN: Soft, nontender, nondistended. Bowel sounds present. No organomegaly or mass.  EXTREMITIES: Noted significant swelling and redness with tenderness to my palpation to both lower extremity.  Feels very less firm to touch.   Edema in the feet has improved  MUSCULOSKELETAL: Normal bulk, and power was 5+ grip and elbow, knee, and ankle flexion and extension bilaterally.  NEUROLOGIC:Alert and oriented x 3. CN 2-12 intact. Gait not tested due to safety concern. PSYCHIATRIC: The patient is alert and oriented x 3.  SKIN:  Areas of redness on both lower extremity between the knee joint extending towards the ankle joint.  Areas of healing blisters on both lower extremity.        LABORATORY PANEL:  Female CBC Recent Labs  Lab 07/08/19 0514  WBC 25.1*  HGB 7.8*  HCT 24.6*  PLT 472*   ------------------------------------------------------------------------------------------------------------------ Chemistries  Recent Labs  Lab 07/06/19 0440  07/08/19 0514  NA 140   < > 140  K 4.6   < > 5.5*  CL 95*   < > 88*  CO2 21*   < > 18*  GLUCOSE 110*   < > 99  BUN 43*   < > 75*  CREATININE 8.60*   < > 12.41*  CALCIUM 8.8*   < > 9.0  MG 1.8  --   --    < > = values in this interval not displayed.   RADIOLOGY:  No results found. ASSESSMENT AND PLAN:   33 y.o. female with past medical history of ESR D on HD Tuesdays, Thursdays and Saturdays, hypertension,  diabetes mellitus, CHF, anemia, and asthma presenting to the ED with chief complaints of bilateral leg swelling and pain.  1. Sepsis -secondary to bilateral lower extremity cellulitis Improving slowly Has persistent leukocytosis Wbc count around 25k Will get ID consult today Patient on empiric antibiotics with IV vancomycin, cefepime and Flagyl So far no growth on blood cultures.   Leukocytosis persists.  No  fevers noted. Ultrasound done with no evidence of DVT. There was initial concern for possible compartment syndrome yesterday.  General surgery consulted.  CT of both lower extremity without contrast done on 07/04/2019 which revealed nonspecific circumferential soft tissue edema, most pronounced at the level of the mid right calf through the ankle. Findings can be seen in fluid overload settings as well as cellulitis in the appropriate clinical setting. No deep fascial fluid. No well-defined or drainable fluid collections. Appears to be improving with ongoing hemodialysis. Patient reevaluated by general surgeon .  Bilateral lower extremity cellulitis and edema improving with ongoing hemodialysis.  No indication for any surgical intervention at this time.  2.  ESRD on HD Tuesdays, Thursdays and Saturday -BUN/creatinine elevated above baseline likely due to missing dialysis  Patient had hemodialysis yesterday.  Also having hemodialysis today due to concern for fluid overload .Nephrology following. On sodium thiosulfate infusions at dialysis  3.  Acute on chronic systolicCongestive Heart Failure: Bilateral lower extremity edema Last Echo 01/2016 withEF 50-55% - Chest x-ray shows vascular congestion likely due to volume overload - Continue Demadex - Continue Imdur and Coreg -Continue inpatient hemodialysis. Low salt diet - Check daily weight - Strict I&Os -So far 12 L of fluid removed  4.HTN- stable Blood pressure controlled on current regimen  5. Diabetes Mellitus Type 2 with complications Glycosylated hemoglobin level less than 4.2.  Sliding scale insulin coverage  6. Anemia of CKD: Status post PRBC transfusion Hemoglobin around 7  7.  Hyperkalemia Started Veltassa as per nephrology Dialysis today   DVT prophylaxis - hold Heparin due to anemia requiring packed red blood cell transfusion  Patient cannot tolerate SCDs to both lower extremity due to severe pains  All the  records are reviewed and case discussed with Care Management/Social Worker. Management plans discussed with the patient, family and they are in agreement. Called and updated patient's mom; [name] [phone]).  She was updated on treatment plans as outlined above.  All questions were answered.  CODE STATUS: Full Code  TOTAL TIME TAKING CARE OF THIS PATIENT: 35 minutes.   More than 50% of the time was spent in counseling/coordination of care: YES  POSSIBLE D/C IN 3 DAYS, DEPENDING ON CLINICAL CONDITION.   Saundra Shelling M.D on 07/08/2019 at 11:56 AM  Between 7am to 6pm - Pager - [phone]  After 6pm go to [email or web address] - Proofreader  [facility]  Office  [phone]  CC: Primary care physician; [facility]  Note: This dictation was prepared with Dragon dictation along with smaller phrase technology. Any transcriptional errors that result from this process are unintentional.

## 2019-07-09 LAB — PHOSPHORUS: Phosphorus: 4.4 mg/dL (ref 2.5–4.6)

## 2019-07-09 LAB — VANCOMYCIN, TROUGH: Vancomycin Tr: 40 ug/mL (ref 15–20)

## 2019-07-09 LAB — OCCULT BLOOD X 1 CARD TO LAB, STOOL: Fecal Occult Bld: NEGATIVE

## 2019-07-09 LAB — RPR: RPR Ser Ql: NONREACTIVE

## 2019-07-09 MED ORDER — RENA-VITE PO TABS
1.0000 | ORAL_TABLET | Freq: Every day | ORAL | Status: DC
  Administered 2019-07-09: 20:00:00 1 via ORAL
  Filled 2019-07-09: qty 1

## 2019-07-09 MED ORDER — NEPRO/CARBSTEADY PO LIQD
237.0000 mL | Freq: Two times a day (BID) | ORAL | Status: DC
  Administered 2019-07-09 – 2019-07-10 (×3): 237 mL via ORAL

## 2019-07-09 MED ORDER — VANCOMYCIN VARIABLE DOSE PER UNSTABLE RENAL FUNCTION (PHARMACIST DOSING): Status: DC

## 2019-07-09 MED ORDER — FERRIC CITRATE 1 GM 210 MG(FE) PO TABS
420.0000 mg | ORAL_TABLET | Freq: Three times a day (TID) | ORAL | Status: DC
  Administered 2019-07-09 – 2019-07-10 (×3): 420 mg via ORAL
  Filled 2019-07-09 (×5): qty 2

## 2019-07-09 NOTE — Progress Notes (Signed)
[place] at [place] NAME: [name]    MR#:  [id number]  DATE OF BIRTH:  02-14-1986  SUBJECTIVE:  CHIEF COMPLAINT:   Chief Complaint  Patient presents with  . Leg Swelling  . Bleeding/Bruising   Patient seen and evaluated today Tolerated PRBC transfusion well 2 days ago Dialysis today again Decreased pain in the legs No chest pain No fever Close to 13 L of fluid removed by dialysis so far  REVIEW OF SYSTEMS:  ROS  Constitutional:  Negative for fever, malaise/fatigue and weight loss.  HENT: Negative for congestion, hearing loss and sore throat.   Eyes: Negative for blurred vision and double vision.  Respiratory: Negative for cough, shortness of breath and wheezing.   Cardiovascular: Positive for leg swelling and pain but improving. Negative for chest pain, palpitations and orthopnea.  Gastrointestinal: Negative for abdominal pain, diarrhea, nausea and vomiting.  Genitourinary: Negative for dysuria and urgency.  Musculoskeletal: Negative for myalgias.       Leg pain  Skin: Negative for rash.  Neurological: Negative for dizziness, sensory change, speech change, focal weakness and headaches.  Psychiatric/Behavioral: Negative for depression.   DRUG ALLERGIES:  No Known Allergies VITALS:  Blood pressure (!) 146/85, pulse (!) 114, temperature 98.5 F (36.9 C), temperature source Oral, resp. rate 19, height 5' 6"  (1.676 m), weight 131.1 kg, last menstrual period 06/05/2019, SpO2 97 %. PHYSICAL EXAMINATION:  Physical Exam  GENERAL:  33 y.o.-year-old patient lying in the bed with no acute distress.  EYES: Pupils equal, round, reactive to light and accommodation. No scleral icterus. Extraocular muscles intact.  HEENT: Head atraumatic, normocephalic. Oropharynx and nasopharynx clear.  NECK:  Supple, no jugular venous distention. No thyroid enlargement, no tenderness.  LUNGS: Normal breath sounds bilaterally, no wheezing,  rales,rhonchi or crepitation. No use of accessory muscles of respiration.  CARDIOVASCULAR: S1, S2 normal. No murmurs, rubs, or gallops.  ABDOMEN: Soft, nontender, nondistended. Bowel sounds present. No organomegaly or mass.  EXTREMITIES: Noted significant swelling and redness with tenderness to my palpation to both lower extremity.  Feels very less firm to touch.   Edema in the feet has improved  MUSCULOSKELETAL: Normal bulk, and power was 5+ grip and elbow, knee, and ankle flexion and extension bilaterally.  NEUROLOGIC:Alert and oriented x 3. CN 2-12 intact. Gait not tested due to safety concern. PSYCHIATRIC: The patient is alert and oriented x 3.  SKIN:  Areas of redness on both lower extremity between the knee joint extending towards the ankle joint.  Areas of healing blisters on both lower extremity.        LABORATORY PANEL:  Female CBC Recent Labs  Lab 07/08/19 0514  WBC 25.1*  HGB 7.8*  HCT 24.6*  PLT 472*   ------------------------------------------------------------------------------------------------------------------ Chemistries  Recent Labs  Lab 07/06/19 0440  07/08/19 0514  NA 140   < > 140  K 4.6   < > 5.5*  CL 95*   < > 88*  CO2 21*   < > 18*  GLUCOSE 110*   < > 99  BUN 43*   < > 75*  CREATININE 8.60*   < > 12.41*  CALCIUM 8.8*   < > 9.0  MG 1.8  --   --    < > = values in this interval not displayed.   RADIOLOGY:  No results found. ASSESSMENT AND PLAN:   33 y.o. female with past medical history of ESR D on HD Tuesdays, Thursdays and  Saturdays, hypertension, diabetes mellitus, CHF, anemia, and asthma presenting to the ED with chief complaints of bilateral leg swelling and pain.  1. Sepsis -secondary to bilateral lower extremity cellulitis Improved Has persistent leukocytosis secondary to cellulitis versus calciphylaxis Wbc count around 25k Appreciate ID consult Antibiotics narrowed down to cefepime So far no growth on blood cultures.   Leukocytosis  persists.  No fevers noted. Ultrasound done with no evidence of DVT. There was initial concern for possible compartment syndrome yesterday.  General surgery consulted.  CT of both lower extremity without contrast done on 07/04/2019 which revealed nonspecific circumferential soft tissue edema, most pronounced at the level of the mid right calf through the ankle. Findings can be seen in fluid overload settings as well as cellulitis in the appropriate clinical setting. No deep fascial fluid. No well-defined or drainable fluid collections. Appears to be improving with ongoing hemodialysis. Patient reevaluated by general surgeon .  Bilateral lower extremity cellulitis and edema improving with ongoing hemodialysis.  No indication for any surgical intervention at this time.  2.  ESRD on HD Tuesdays, Thursdays and Saturday -BUN/creatinine elevated above baseline likely due to missing dialysis  Patient had hemodialysis yesterday.  Also having hemodialysis today due to concern for fluid overload .Nephrology following. On sodium thiosulfate infusions at dialysis  3.  Acute on chronic systolicCongestive Heart Failure: Bilateral lower extremity edema Last Echo 01/2016 withEF 50-55% - Chest x-ray shows vascular congestion likely due to volume overload - Continue Demadex - Continue Imdur and Coreg -Continue inpatient hemodialysis. Low salt diet - Check daily weight - Strict I&Os -So far 12 L of fluid removed  4.HTN- stable Blood pressure controlled on current regimen  5. Diabetes Mellitus Type 2 with complications Glycosylated hemoglobin level less than 4.2.  Sliding scale insulin coverage  6. Anemia of CKD: Status post PRBC transfusion Hemoglobin around 7  7.  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 Management plans discussed with the patient, family and they are in agreement. Called and updated patient's mom; Ms. Mertice Uffelman [phone]).  She was updated on treatment plans as outlined above.  All questions were answered.  CODE STATUS: Full Code  TOTAL TIME TAKING CARE OF THIS PATIENT: 34 minutes.   More than 50% of the time was spent in counseling/coordination of care: YES  POSSIBLE D/C IN 3 DAYS, DEPENDING ON CLINICAL CONDITION.   Saundra Shelling M.D on 07/09/2019 at 11:49 AM  Between 7am to 6pm - Pager - [phone]  After 6pm go to [email or web address] - Proofreader  [facility]  Office  [phone]  CC: Primary care physician; [facility]  Note: This dictation was prepared with Dragon dictation along with smaller phrase technology. Any transcriptional errors that result from this process are unintentional.

## 2019-07-09 NOTE — Progress Notes (Signed)
This note also relates to the following rows which could not be included: Pulse Rate - Cannot attach notes to unvalidated device data Resp - Cannot attach notes to unvalidated device data BP - Cannot attach notes to unvalidated device data SpO2 - Cannot attach notes to unvalidated device data    07/09/19 1330  Vital Signs  Temp 97.8 F (36.6 C)  Temp Source Oral  Pulse Rate Source Monitor  BP Location Right Arm  BP Method Automatic  Patient Position (if appropriate) Lying  Oxygen Therapy  O2 Device Room Air  During Hemodialysis Assessment  Blood Flow Rate (mL/min) 400 mL/min  Arterial Pressure (mmHg) -170 mmHg  Venous Pressure (mmHg) 140 mmHg  Transmembrane Pressure (mmHg) 30 mmHg  Ultrafiltration Rate (mL/min) 0 mL/min  Dialysate Flow Rate (mL/min) 800 ml/min  Conductivity: Machine  14.2  HD Safety Checks Performed Yes  KECN 77.4 KECN  Dialysis Fluid Bolus Normal Saline  Bolus Amount (mL) 250 mL  Intra-Hemodialysis Comments Tx completed  Post-Hemodialysis Assessment  Rinseback Volume (mL) 500 mL  KECN 77.9 V  Dialyzer Clearance Lightly streaked  Duration of HD Treatment -hour(s) 3.5 hour(s)  Hemodialysis Intake (mL) 200 mL  UF Total -Machine (mL) 1181 mL  Net UF (mL) 981 mL  Tolerated HD Treatment Yes  HD TX COMPLETED TOLERATED WELL NO C/OS OF PAIN VS STABLE AT THIS TIME UFG 1181 CVC WDL TRANSPORTING BACK TO ROOM VIA BED WITH TRANSPORT TEAM

## 2019-07-09 NOTE — Progress Notes (Signed)
This note also relates to the following rows which could not be included: Pulse Rate - Cannot attach notes to unvalidated device data Resp - Cannot attach notes to unvalidated device data BP - Cannot attach notes to unvalidated device data SpO2 - Cannot attach notes to unvalidated device data  Pt requested to end tx early agreed to run as long as she can to receive sodium thiosulfate medication

## 2019-07-09 NOTE — Progress Notes (Signed)
[place], [place] 07/09/19  Subjective:  Patient seen at bedside. 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 Due for another dialysis session today.    Objective:  Vital signs in last 24 hours:  Temp:  [98.2 F (36.8 C)-99.5 F (37.5 C)] 98.5 F (36.9 C) (09/22 0940) Pulse Rate:  [91-114] 114 (09/22 1123) Resp:  [16-23] 19 (09/22 1123) BP: (109-153)/(77-95) 146/85 (09/22 1123) SpO2:  [93 %-100 %] 97 % (09/22 1123)  Weight change:  Filed Weights   07/05/19 0920 07/05/19 0926 07/06/19 1040  Weight: 129.3 kg 129.3 kg 131.1 kg    Intake/Output:    Intake/Output Summary (Last 24 hours) at 07/09/2019 1131 Last data filed at 07/09/2019 1004 Gross per 24 hour  Intake 360 ml  Output 2300 ml  Net -1940 ml    Gen:   Awake, alert, no acute distress Head:   Normocephalic, without obvious abnormality, atraumatic Eyes/ENT:  conjunctiva/corneas clear,  moist oral mucus membranes Neck:  Supple Lungs:   Clear to auscultation bilaterally, normal effort Heart:   Regular rate and rhythm, S1, S2 normal Abdomen:   Soft, non-tender  Extremities: no cyanosis; 2+  Edema;  Skin:  Skin color, texture, turgor normal, ulcerative lesions at back of calf  Neurologic: Alert and oriented, able to answer questions appropriately Access:           Permacath, PD catheter in place         Basic Metabolic Panel:  Recent Labs  Lab 07/03/19 0417 07/04/19 0416 07/05/19 0351 07/06/19 0440 07/07/19 0533 07/07/19 0929 07/08/19 0514  NA 138 138 138 140 145  --  140  K 4.5 4.4 4.6 4.6 5.7* 5.4* 5.5*  CL 103 100 98 95* 94*  --  88*  CO2 20* 23 19* 21* 17*  --  18*  GLUCOSE 96 106* 97 110* 85  --  99  BUN 73* 55* 65* 43* 60*  --  75*  CREATININE 13.43* 10.53* 12.22* 8.60* 10.60*  --  12.41*  CALCIUM 7.7* 8.0* 8.1* 8.8* 9.2  --  9.0  MG 1.7  --  1.7 1.8  --   --   --   PHOS  --  7.8* 8.6* 6.1*  --   --   --      CBC: Recent Labs  Lab 07/04/19  0416 07/05/19 0351 07/05/19 1425 07/05/19 1847 07/06/19 0440 07/07/19 0533 07/08/19 0514  WBC 16.8* 16.6*  --   --  20.7* 25.6* 25.1*  HGB 7.3* 6.5* 5.7* 7.9* 7.0* 7.3* 7.8*  HCT 23.5* 20.1*  --  23.5* 21.1* 22.9* 24.6*  MCV 88.0 84.8  --   --  84.4 86.4 86.3  PLT 264 266  --   --  298 382 472*      Lab Results  Component Value Date   HEPBSAG Negative 02/11/2016   HEPBSAB Non Reactive 02/11/2016      Microbiology:  Recent Results (from the past 240 hour(s))  Culture, blood (Routine x 2)     Status: None   Collection Time: 07/02/19  8:08 PM   Specimen: BLOOD  Result Value Ref Range Status   Specimen Description BLOOD LEFT ANTECUBITAL  Final   Special Requests   Final    BOTTLES DRAWN AEROBIC AND ANAEROBIC Blood Culture results may not be optimal due to an excessive volume of blood received in culture bottles   Culture   Final    NO GROWTH 5 DAYS Performed at [facility]  [facility], [address], [place], [place] [address]    Report Status 07/07/2019 FINAL  Final  Culture, blood (Routine x 2)     Status: None   Collection Time: 07/02/19  8:08 PM   Specimen: BLOOD  Result Value Ref Range Status   Specimen Description BLOOD BLOOD LEFT HAND  Final   Special Requests   Final    BOTTLES DRAWN AEROBIC AND ANAEROBIC Blood Culture adequate volume   Culture   Final    NO GROWTH 5 DAYS Performed at [facility], [address], [place], [place] [address]    Report Status 07/07/2019 FINAL  Final  SARS CORONAVIRUS 2 (TAT 6-24 HRS) Nasopharyngeal Nasopharyngeal Swab     Status: None   Collection Time: 07/02/19 10:48 PM   Specimen: Nasopharyngeal Swab  Result Value Ref Range Status   SARS Coronavirus 2 NEGATIVE NEGATIVE Final    Comment: (NOTE) SARS-CoV-2 target nucleic acids are NOT DETECTED. The SARS-CoV-2 RNA is generally detectable in upper and lower respiratory specimens during the acute phase of infection. Negative results do not preclude SARS-CoV-2  infection, do not rule out co-infections with other pathogens, and should not be used as the sole basis for treatment or other patient management decisions. Negative results must be combined with clinical observations, patient history, and epidemiological information. The expected result is Negative. Fact Sheet for Patients: SugarRoll.be Fact Sheet for Healthcare Providers: https://www.woods-mathews.com/ This test is not yet approved or cleared by the [place] FDA and  has been authorized for detection and/or diagnosis of SARS-CoV-2 by FDA under an Emergency Use Authorization (EUA). This EUA will remain  in effect (meaning this test can be used) for the duration of the COVID-19 declaration under Section 56 4(b)(1) of the Act, 21 U.S.C. section 360bbb-3(b)(1), unless the authorization is terminated or revoked sooner. Performed at [facility], [place] [address] [place], [place] [address]   MRSA PCR Screening     Status: None   Collection Time: 07/07/19  8:41 AM   Specimen: Nasopharyngeal  Result Value Ref Range Status   MRSA by PCR NEGATIVE NEGATIVE Final    Comment:        The GeneXpert MRSA Assay (FDA approved for NASAL specimens only), is one component of a comprehensive MRSA colonization surveillance program. It is not intended to diagnose MRSA infection nor to guide or monitor treatment for MRSA infections. Performed at [facility], [address] [place], [place] [address]     Coagulation Studies: No results for input(s): LABPROT, INR in the last 72 hours.  Urinalysis: No results for input(s): COLORURINE, LABSPEC, PHURINE, GLUCOSEU, HGBUR, BILIRUBINUR, KETONESUR, PROTEINUR, UROBILINOGEN, NITRITE, LEUKOCYTESUR in the last 72 hours.  Invalid input(s): APPERANCEUR    Imaging: No results found.   Medications:   . sodium chloride 250 mL (07/07/19 2249)  . ceFEPime (MAXIPIME) IV 2 g (07/06/19 1438)  .  sodium thiosulfate infusion for calciphylaxis     . aspirin EC  81 mg Oral Daily  . calcium acetate  1,334 mg Oral TID WC  . carvedilol  25 mg Oral BID WC  . Chlorhexidine Gluconate Cloth  6 each Topical Q0600  . epoetin (EPOGEN/PROCRIT) injection  10,000 Units Intravenous Q T,Th,Sa-HD  . feeding supplement (NEPRO CARB STEADY)  237 mL Oral BID BM  . gabapentin  300 mg Oral QHS  . isosorbide mononitrate  30 mg Oral BID  . multivitamin  1 tablet Oral QHS  . patiromer  16.8 g  Oral Daily  . promethazine  12.5 mg Intravenous Once  . torsemide  40 mg Oral Daily   sodium chloride, acetaminophen, diphenhydrAMINE, HYDROmorphone (DILAUDID) injection, hydrOXYzine, labetalol, ondansetron (ZOFRAN) IV, oxyCODONE-acetaminophen  Assessment/ Plan:  33 y.o.African [place] female with ESTD, HTN, DM, CHF, Anemia, Asthma, Chronic edema  Active Problems:   Sepsis due to cellulitis [facility])  [facility]/ [employer] TTS  #. ESRD with volume overload, hyperkalemia Recent Labs    07/05/19 0351 07/06/19 0440 07/07/19 0533 07/08/19 0514  CREATININE 12.22* 8.60* 10.60* 12.41*    Patient seen at bedside.  Continues to have some volume overload.  Plan for hemodialysis today.  Orders have been prepared.    #. Anemia of CKD  Lab Results  Component Value Date   HGB 7.8 (L) 07/08/2019  Patient to receive Epogen 10,000 units IV with dialysis today.  #. SHPTH     Component Value Date/Time   PTH 217 (H) 02/11/2016 1317   Lab Results  Component Value Date   PHOS 6.1 (H) 07/06/2019   Repeat serum phosphorus today.  Discontinue calcium acetate in favor of Auryxia 2 tablets p.o. 3 times daily with meals given calciphylaxis.  In addition patient be continued on sodium thiosulfate for treatment of calciphylaxis.  #. HTN   Maintain the patient on carvedilol for now.  #. Diabetes type 2 with CKD Hgb A1c MFr Bld (%)  Date Value  07/04/2019 5.1   # Leg ulcers with chronic leg edema Differential includes  cellulitis vs calciphylaxis continue sodium thiosulfate       LOS: 7 [facility] 9/22/202011:31 AM  [place], [place]

## 2019-07-09 NOTE — Progress Notes (Signed)
Pharmacy Antibiotic Note  [name] is a 33 y.o. female admitted on 07/02/2019 with sepsis due to cellulitis  Pharmacy has been consulted for Vancomycin and Cefepime dosing.  Her normal HD schedule is Tuesdays, Thursdays and Saturdays. This is day #4 of IV antibiotics, leukocytosis is improved, although still elevated, no recent fevers. Dr Stark Jock plans to continue the current antibiotics for another 1-2 days over concerns for compartment syndrome and continued tenderness  Plan: 09/22 @ 0600 VR 40 mcg/mL which is above the accepted 20 - 25 mcg/mL level for a pre-HD vanc level. Will hold off on giving vanc for today's dialysis and will draw a pre-HD level @ 09/23 @ 0500, where roughly 40 - 50% of the vanc should be dialyzed out from today's HD session. If pre-HD levels comes back < 25 mcg/mL will restart a reduced dose of vanc 750 mg IV qHD.   Height: 5\' 6"  (167.6 cm) Weight: 289 lb 0.4 oz (131.1 kg) IBW/kg (Calculated) : 59.3  Temp (24hrs), Avg:98.5 F (36.9 C), Min:98.2 F (36.8 C), Max:99.5 F (37.5 C)  Recent Labs  Lab 07/02/19 2008 07/02/19 2224  07/04/19 0416 07/05/19 0351 07/06/19 0440 07/07/19 0533 07/08/19 0514 07/09/19 0543  WBC  --   --    < > 16.8* 16.6* 20.7* 25.6* 25.1*  --   CREATININE  --   --    < > 10.53* 12.22* 8.60* 10.60* 12.41*  --   LATICACIDVEN 1.2 1.1  --   --   --   --   --   --   --   VANCOTROUGH  --   --   --   --   --   --   --   --  40*  VANCORANDOM  --   --   --   --   --  38  --   --   --    < > = values in this interval not displayed.    Estimated Creatinine Clearance: 8.9 mL/min (A) (by C-G formula based on SCr of 12.41 mg/dL (H)).    No Known Allergies  Antimicrobials this admission: Cefepime 9/15 >>  Vancomycin 9/15 >>  Flagyl 500mg  9/15 >>  Microbiology results:  9/15 BCx: NGTD  9/15 SARS-CoV-2: negative    9/16 MRSA PCR: pending   Thank you for allowing pharmacy to be a part of this patient's care.  Tobie Lords,  PharmD 07/09/2019 6:31 AM

## 2019-07-09 NOTE — Progress Notes (Signed)
ID   Date of Admission:  07/02/2019    Subjective: Doing better Says pain legs better  Medications:  . aspirin EC  81 mg Oral Daily  . carvedilol  25 mg Oral BID WC  . Chlorhexidine Gluconate Cloth  6 each Topical Q0600  . epoetin (EPOGEN/PROCRIT) injection  10,000 Units Intravenous Q T,Th,Sa-HD  . feeding supplement (NEPRO CARB STEADY)  237 mL Oral BID BM  . ferric citrate  420 mg Oral TID WC  . gabapentin  300 mg Oral QHS  . isosorbide mononitrate  30 mg Oral BID  . multivitamin  1 tablet Oral QHS  . patiromer  16.8 g Oral Daily  . promethazine  12.5 mg Intravenous Once  . torsemide  40 mg Oral Daily    Objective: Vital signs in last 24 hours: Temp:  [97.8 F (36.6 C)-99.5 F (37.5 C)] 98.5 F (36.9 C) (09/22 1419) Pulse Rate:  [90-114] 101 (09/22 1419) Resp:  [16-23] 18 (09/22 1419) BP: (137-168)/(78-95) 143/88 (09/22 1419) SpO2:  [94 %-100 %] 100 % (09/22 1419)  PHYSICAL EXAM:  General: Alert, cooperative, no distress, appears stated age.  Head: Normocephalic, without obvious abnormality, atraumatic. Eyes: Conjunctivae clear, anicteric sclerae. Pupils are equal Extremities: b/l swelling and induration calf - tender Skin: overlying has ischemia      Neurologic: Grossly non-focal  Lab Results Recent Labs    07/07/19 0533 07/07/19 0929 07/08/19 0514  WBC 25.6*  --  25.1*  HGB 7.3*  --  7.8*  HCT 22.9*  --  24.6*  NA 145  --  140  K 5.7* 5.4* 5.5*  CL 94*  --  88*  CO2 17*  --  18*  BUN 60*  --  75*  CREATININE 10.60*  --  12.41*   Liver Panel No results for input(s): PROT, ALBUMIN, AST, ALT, ALKPHOS, BILITOT, BILIDIR, IBILI in the last 72 hours. Sedimentation Rate No results for input(s): ESRSEDRATE in the last 72 hours. C-Reactive Protein No results for input(s): CRP in the last 72 hours.  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 vasculitis Labs sent  Hypertension- on torsemide, ISMO and carvedilol  Discussed with patient, mom and Dr.Pyreddy ID will sign off- call if needed

## 2019-07-09 NOTE — Progress Notes (Signed)
   07/09/19 1000  Vital Signs  Pulse Rate 91  Pulse Rate Source Monitor  Resp (!) 23  BP 140/84  BP Location Right Arm  BP Method Automatic  Patient Position (if appropriate) Lying  Oxygen Therapy  SpO2 99 %  O2 Device Room Air  Time-Out for Hemodialysis  What Procedure? Hemodialysis  Pt Identifiers(min of two) First/Last Name;MRN/Account#  Correct Site? Yes  Correct Side? Yes  Correct Procedure? Yes  Consents Verified? Yes  Rad Studies Available? N/A  Safety Precautions Reviewed? Yes  Engineer, civil (consulting) Number 3  Station Number 1  UF/Alarm Test Passed  Conductivity: Meter 14.4  Conductivity: Machine  14.4  pH 7.6  Normal Saline Lot Number [id number]  Dialyzer Lot Number [id number]  Disposable Set Lot Number [id number]  Dialysate Acid Bath Lot Number [id number]  Dialysate HCO3 Bath Lot Number [id number]  Machine Temperature 98.6 F (37 C)  Musician and Audible Yes  Blood Lines Intact and Secured Yes  Pre Treatment Patient Checks  Vascular access used during treatment Catheter  HD catheter dressing before treatment WDL  Hepatitis B Surface Antigen Results Negative  Date Hepatitis B Surface Antigen Drawn 06/25/19  Hepatitis B Surface Antibody  (NONREACTIVE)  Date Hepatitis B Surface Antibody Drawn 06/25/19  Hemodialysis Consent Verified Yes  Hemodialysis Standing Orders Initiated Yes  ECG (Telemetry) Monitor On Yes  Prime Ordered Normal Saline  Length of  DialysisTreatment -hour(s) 3.5 Hour(s)  Dialyzer Elisio 17H NR  Dialysate 2K;2.5 Ca  Dialysate Flow Ordered 800  Blood Flow Rate Ordered 400 mL/min  Ultrafiltration Goal 2.5 Liters  Dialysis Blood Pressure Support Ordered Normal Saline  During Hemodialysis Assessment  Blood Flow Rate (mL/min) 400 mL/min  Arterial Pressure (mmHg) -150 mmHg  Venous Pressure (mmHg) 100 mmHg  Transmembrane Pressure (mmHg) 50 mmHg  Ultrafiltration Rate (mL/min) 860 mL/min  Dialysate Flow Rate (mL/min) 800 ml/min  Conductivity:  Machine  14.4  HD Safety Checks Performed Yes  Dialysis Fluid Bolus Normal Saline  Bolus Amount (mL) 250 mL  Intra-Hemodialysis Comments Tx initiated  PT REPORTED TO HD UNIT VIA BED WITH NO C/OS AND NO DISTRESS NOTED PT STABLE UFG 2.5L VIA CVC WDL

## 2019-07-10 LAB — BASIC METABOLIC PANEL
Anion gap: 20 — ABNORMAL HIGH (ref 5–15)
BUN: 39 mg/dL — ABNORMAL HIGH (ref 6–20)
CO2: 25 mmol/L (ref 22–32)
Calcium: 9.5 mg/dL (ref 8.9–10.3)
Chloride: 96 mmol/L — ABNORMAL LOW (ref 98–111)
Creatinine, Ser: 7.41 mg/dL — ABNORMAL HIGH (ref 0.44–1.00)
GFR calc Af Amer: 8 mL/min — ABNORMAL LOW (ref >60)
GFR calc non Af Amer: 7 mL/min — ABNORMAL LOW (ref >60)
Glucose, Bld: 101 mg/dL — ABNORMAL HIGH (ref 70–99)
Potassium: 4.6 mmol/L (ref 3.5–5.1)
Sodium: 141 mmol/L (ref 135–145)

## 2019-07-10 LAB — CBC
HCT: 23.7 % — ABNORMAL LOW (ref 36.0–46.0)
Hemoglobin: 7.4 g/dL — ABNORMAL LOW (ref 12.0–15.0)
MCH: 27.5 pg (ref 26.0–34.0)
MCHC: 31.2 g/dL (ref 30.0–36.0)
MCV: 88.1 fL (ref 80.0–100.0)
Platelets: 401 10*3/uL — ABNORMAL HIGH (ref 150–400)
RBC: 2.69 MIL/uL — ABNORMAL LOW (ref 3.87–5.11)
RDW: 17.2 % — ABNORMAL HIGH (ref 11.5–15.5)
WBC: 23.2 10*3/uL — ABNORMAL HIGH (ref 4.0–10.5)
nRBC: 0 % (ref 0.0–0.2)

## 2019-07-10 LAB — ANCA TITERS
Atypical P-ANCA titer: <1:20 {titer}
C-ANCA: <1:20 {titer}
P-ANCA: <1:20 {titer}

## 2019-07-10 LAB — ANA W/REFLEX IF POSITIVE: Anti Nuclear Antibody (ANA): NEGATIVE

## 2019-07-10 LAB — ANTIPHOSPHOLIPID SYNDROME PROF
Anticardiolipin IgG: <9 GPL U/mL (ref 0–14)
Anticardiolipin IgM: <9 MPL U/mL (ref 0–12)
DRVVT: 33.3 s (ref 0.0–47.0)
PTT Lupus Anticoagulant: 28.7 s (ref 0.0–51.9)

## 2019-07-10 MED ORDER — OXYCODONE-ACETAMINOPHEN 5-325 MG PO TABS: 1.0000 | ORAL_TABLET | Freq: Four times a day (QID) | ORAL | 0 refills | Status: DC | PRN

## 2019-07-10 MED ORDER — FERRIC CITRATE 1 GM 210 MG(FE) PO TABS: 420.0000 mg | ORAL_TABLET | Freq: Three times a day (TID) | ORAL | 0 refills | Status: AC

## 2019-07-10 MED ORDER — FERRIC CITRATE 1 GM 210 MG(FE) PO TABS: 420.0000 mg | ORAL_TABLET | Freq: Three times a day (TID) | ORAL | Status: DC

## 2019-07-10 MED ORDER — OXYCODONE-ACETAMINOPHEN 7.5-325 MG PO TABS: 1.0000 | ORAL_TABLET | Freq: Four times a day (QID) | ORAL | 0 refills | Status: DC | PRN

## 2019-07-10 NOTE — Care Management (Signed)
Patient discharged home today Patient to discontinue phoslo and start Aurxia   Patient does not have Medicare part d coverage, and states normally she uses a goodrx coupon  [facility] called patient's pharmacy [facility].  With goodrx coupon phoslo was $52.59. Lorin Picket would be $1,148.18.  There is no substitute for the medication    Per MD patient can not continue phoslo  RNCM reached out to Medication Management.  Patient will need to fill out eligibility paper work.  Packet provided to patient.  If she is approved they will be able to order Lorin Picket, however it will take approximately 6 weeks to get  MD and nephology notified  Per nephrology patient to pick up samples of Aurxyia from the HD clinic today, then proceed with obtaining medication from Medication Management .  Patient notified, she will go to the clinic to get samples after discharge  Patient provided goodrx coupon for pain medication

## 2019-07-10 NOTE — Progress Notes (Signed)
[place], [place] 07/10/19  Subjective:  Patient completed dialysis yesterday. Did develop a headache during dialysis treatment. Feeling better today.    Objective:  Vital signs in last 24 hours:  Temp:  [97.8 F (36.6 C)-98.9 F (37.2 C)] 98.9 F (37.2 C) (09/23 0419) Pulse Rate:  [90-114] 99 (09/23 0844) Resp:  [16-22] 20 (09/23 0419) BP: (137-168)/(78-95) 153/94 (09/23 0844) SpO2:  [94 %-100 %] 97 % (09/23 0419)  Weight change:  Filed Weights   07/05/19 0920 07/05/19 0926 07/06/19 1040  Weight: 129.3 kg 129.3 kg 131.1 kg    Intake/Output:    Intake/Output Summary (Last 24 hours) at 07/10/2019 1026 Last data filed at 07/10/2019 0900 Gross per 24 hour  Intake 690.5 ml  Output 1281 ml  Net -590.5 ml    Gen:   Awake, alert, no acute distress Head:   Normocephalic, atraumatic Eyes/ENT:  conjunctiva/corneas clear,  moist oral mucus membranes Neck:  Supple Lungs:   Clear to auscultation bilaterally, normal effort Heart:   Regular rate and rhythm, S1, S2 normal Abdomen:   Soft, non-tender  Extremities: no cyanosis; 2+  Edema;  Skin:  ulcerative lesions at back of calf  Neurologic: Alert and oriented, able to answer questions appropriately Access:           Permacath, PD catheter in place         Basic Metabolic Panel:  Recent Labs  Lab 07/04/19 0416 07/05/19 0351 07/06/19 0440 07/07/19 0533 07/07/19 0929 07/08/19 0514 07/09/19 1100 07/10/19 0351  NA 138 138 140 145  --  140  --  141  K 4.4 4.6 4.6 5.7* 5.4* 5.5*  --  4.6  CL 100 98 95* 94*  --  88*  --  96*  CO2 23 19* 21* 17*  --  18*  --  25  GLUCOSE 106* 97 110* 85  --  99  --  101*  BUN 55* 65* 43* 60*  --  75*  --  39*  CREATININE 10.53* 12.22* 8.60* 10.60*  --  12.41*  --  7.41*  CALCIUM 8.0* 8.1* 8.8* 9.2  --  9.0  --  9.5  MG  --  1.7 1.8  --   --   --   --   --   PHOS 7.8* 8.6* 6.1*  --   --   --  4.4  --      CBC: Recent Labs  Lab 07/05/19 0351   07/05/19 1847 07/06/19 0440 07/07/19 0533 07/08/19 0514 07/10/19 0351  WBC 16.6*  --   --  20.7* 25.6* 25.1* 23.2*  HGB 6.5*   < > 7.9* 7.0* 7.3* 7.8* 7.4*  HCT 20.1*  --  23.5* 21.1* 22.9* 24.6* 23.7*  MCV 84.8  --   --  84.4 86.4 86.3 88.1  PLT 266  --   --  298 382 472* 401*   < > = values in this interval not displayed.      Lab Results  Component Value Date   HEPBSAG Negative 02/11/2016   HEPBSAB Non Reactive 02/11/2016      Microbiology:  Recent Results (from the past 240 hour(s))  Culture, blood (Routine x 2)     Status: None   Collection Time: 07/02/19  8:08 PM   Specimen: BLOOD  Result Value Ref Range Status   Specimen Description BLOOD LEFT ANTECUBITAL  Final   Special Requests   Final    BOTTLES DRAWN AEROBIC AND ANAEROBIC Blood  Culture results may not be optimal due to an excessive volume of blood received in culture bottles   Culture   Final    NO GROWTH 5 DAYS Performed at [facility], [address] [place], [place] [address]    Report Status 07/07/2019 FINAL  Final  Culture, blood (Routine x 2)     Status: None   Collection Time: 07/02/19  8:08 PM   Specimen: BLOOD  Result Value Ref Range Status   Specimen Description BLOOD BLOOD LEFT HAND  Final   Special Requests   Final    BOTTLES DRAWN AEROBIC AND ANAEROBIC Blood Culture adequate volume   Culture   Final    NO GROWTH 5 DAYS Performed at [facility], [address] [place], [place] [address]    Report Status 07/07/2019 FINAL  Final  SARS CORONAVIRUS 2 (TAT 6-24 HRS) Nasopharyngeal Nasopharyngeal Swab     Status: None   Collection Time: 07/02/19 10:48 PM   Specimen: Nasopharyngeal Swab  Result Value Ref Range Status   SARS Coronavirus 2 NEGATIVE NEGATIVE Final    Comment: (NOTE) SARS-CoV-2 target nucleic acids are NOT DETECTED. The SARS-CoV-2 RNA is generally detectable in upper and lower respiratory specimens during the acute phase of infection. Negative results do  not preclude SARS-CoV-2 infection, do not rule out co-infections with other pathogens, and should not be used as the sole basis for treatment or other patient management decisions. Negative results must be combined with clinical observations, patient history, and epidemiological information. The expected result is Negative. Fact Sheet for Patients: SugarRoll.be Fact Sheet for Healthcare Providers: https://www.woods-mathews.com/ This test is not yet approved or cleared by the [place] FDA and  has been authorized for detection and/or diagnosis of SARS-CoV-2 by FDA under an Emergency Use Authorization (EUA). This EUA will remain  in effect (meaning this test can be used) for the duration of the COVID-19 declaration under Section 56 4(b)(1) of the Act, 21 U.S.C. section 360bbb-3(b)(1), unless the authorization is terminated or revoked sooner. Performed at [facility], [place] [address] [place], [place] [address]   MRSA PCR Screening     Status: None   Collection Time: 07/07/19  8:41 AM   Specimen: Nasopharyngeal  Result Value Ref Range Status   MRSA by PCR NEGATIVE NEGATIVE Final    Comment:        The GeneXpert MRSA Assay (FDA approved for NASAL specimens only), is one component of a comprehensive MRSA colonization surveillance program. It is not intended to diagnose MRSA infection nor to guide or monitor treatment for MRSA infections. Performed at [facility], [address] [place], [place] [address]     Coagulation Studies: No results for input(s): LABPROT, INR in the last 72 hours.  Urinalysis: No results for input(s): COLORURINE, LABSPEC, PHURINE, GLUCOSEU, HGBUR, BILIRUBINUR, KETONESUR, PROTEINUR, UROBILINOGEN, NITRITE, LEUKOCYTESUR in the last 72 hours.  Invalid input(s): APPERANCEUR    Imaging: No results found.   Medications:   . sodium chloride 10 mL/hr at 07/09/19 1643  . sodium thiosulfate  infusion for calciphylaxis     . aspirin EC  81 mg Oral Daily  . carvedilol  25 mg Oral BID WC  . Chlorhexidine Gluconate Cloth  6 each Topical Q0600  . epoetin (EPOGEN/PROCRIT) injection  10,000 Units Intravenous Q T,Th,Sa-HD  . feeding supplement (NEPRO CARB STEADY)  237 mL Oral BID BM  . ferric citrate  420 mg Oral TID WC  . gabapentin  300 mg  Oral QHS  . isosorbide mononitrate  30 mg Oral BID  . multivitamin  1 tablet Oral QHS  . patiromer  16.8 g Oral Daily  . promethazine  12.5 mg Intravenous Once  . torsemide  40 mg Oral Daily   sodium chloride, acetaminophen, diphenhydrAMINE, HYDROmorphone (DILAUDID) injection, hydrOXYzine, labetalol, ondansetron (ZOFRAN) IV, oxyCODONE-acetaminophen  Assessment/ Plan:  33 y.o.African [place] female with ESTD, HTN, DM, CHF, Anemia, Asthma, Chronic edema  Active Problems:   Sepsis due to cellulitis [facility])  [facility] [employer] TTS  #. ESRD with volume overload, hyperkalemia Recent Labs    07/06/19 0440 07/07/19 0533 07/08/19 0514 07/10/19 0351  CREATININE 8.60* 10.60* 12.41* 7.41*    Patient completed dialysis yesterday.  No acute indication for dialysis today.  Next dialysis treatment tomorrow as an outpatient.    #. Anemia of CKD  Lab Results  Component Value Date   HGB 7.4 (L) 07/10/2019  Continue Epogen as an outpatient.  #. SHPTH     Component Value Date/Time   PTH 217 (H) 02/11/2016 1317   Lab Results  Component Value Date   PHOS 4.4 07/09/2019   Patient taken off of calcium acetate in favor of Auryxia 2 tablets p.o. 3 times daily with meals.  Continue to monitor bone mineral metabolism parameters as an outpatient.  Patient also to continue on sodium thiosulfate..  #. HTN   Continue carvedilol upon discharge.  #. Diabetes type 2 with CKD Hgb A1c MFr Bld (%)  Date Value  07/04/2019 5.1   # Leg ulcers with chronic leg edema Differential includes cellulitis vs calciphylaxis continue sodium thiosulfate        LOS: 8 Myishia Kasik 9/23/202010:26 AM  [place], [place]

## 2019-07-10 NOTE — Discharge Summary (Signed)
[place] [facility] [place] NAME: [name]    MR#:  [id number]  DATE OF BIRTH:  [birth year]  DATE OF ADMISSION:  07/02/2019 ADMITTING PHYSICIAN: Lang Snow, NP  DATE OF DISCHARGE: 07/10/2019  PRIMARY CARE PHYSICIAN: [facility]   ADMISSION DIAGNOSIS:  Bilateral leg edema [R60.0] Cellulitis of right lower extremity [L03.115] Suspected deep vein thrombosis (DVT) [R09.89] Sepsis, due to unspecified organism, unspecified whether acute organ dysfunction present ([place]) [A41.9]  DISCHARGE DIAGNOSIS:  Active Problems:   Sepsis due to cellulitis ([place]) Cellulitis of the lower extremity 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 Anemia of chronic disease   . Asthma   . CHF (congestive heart failure) ([place])   . CKD (chronic kidney disease), stage III ([place])   . Diabetes ([place])    type 2  . Hypertension   . Morbid obesity [facility])      ADMITTING HISTORY 33 year old female with past medical history of ESR D on HD Tuesdays, Thursdays and Saturdays, hypertension, diabetes mellitus, CHF, anemia, and asthma presenting to the ED with chief complaints of bilateral leg swelling and pain. Ports onset of leg swelling for the past few months with worsening symptoms in the last month. She was recently treated with doxycycline for left lower leg redness with no improvement.  She describes symptoms of warmth in her leg, constant aching and burning worse with ambulation.  Patient states she is developed raw areas in her bilateral legs and bruising which is caused abrasion and ulcers to the back of her right calf.  Patient states she was unable to go to dialysis today due to not feeling well from constant pain associated with fever last night.  Denies shortness of breath, chest pain, palpitations,  nausea or vomiting, abdominal pain or any recent sick contacts. On arrival to the ED, she was afebrile with blood pressure 213/130 mm Hg and pulse rate 131 beats/min. There were no focal neurological deficits; she was alert and oriented x4.  Labs revealed WBC 24.1, hemoglobin 8.8, glucose 104, BUN 69, creatinine 12.9, lactic acid 1.2.  X-ray of the right leg shows no evidence of fracture or lesion.  Ultrasound venous bilateral shows no evidence of DVT.  Patient was started on empiric antibiotics for cellulitis in the ED and will be admitted under hospitalist service for further management.  HOSPITAL COURSE:  Patient was worked up with bilateral lower extremity Doppler ultrasound which showed no DVT.  Patient was started on broad-spectrum antibiotics for lower extremity cellulitis.  She was aggressively dialyzed during the hospitalization more than 40 L of fluid were removed.  Lower extremity swelling improved after excess fluid was removed by dialysis.  Patient tolerated antibiotics well cultures did not reveal any growth.. Patient receives sodium thiosulfate infusions during dialysis for treatment of calciphylaxis.  She was also evaluated by infectious disease attending who recommended to stop all antibiotics.  Old Echocardiogram reviewed.  Patient has elevated WBC count secondary to calciphylaxis which is closely followed.  No evidence of any infection.  No fevers during hospitalization.  Potassium has normalized at the time of discharge.  She also received Veltassa during the stay for hyperkalemia.  PhosLo has been stopped secondary to calciphylaxis.Patient started on [place].  Patient was extensively worked up with CT scan of the lower extremity as well as x-ray imaging studies during  the hospitalization. Surgical consultation was also done during hospitalization.  Compartment syndrome was ruled out and no intervention was recommended. CONSULTS OBTAINED:  Treatment Team:  Murlean Iba, MD Benjamine Sprague, DO Infectious disease attending DRUG ALLERGIES:  No Known Allergies  DISCHARGE MEDICATIONS:   Allergies as of 07/10/2019   No Known Allergies     Medication List    STOP taking these medications   calcium acetate 667 MG capsule Commonly known as: PHOSLO     TAKE these medications   amLODipine 10 MG tablet Commonly known as: NORVASC Take 10 mg by mouth daily.   aspirin 81 MG EC tablet Take 1 tablet (81 mg total) by mouth daily.   carvedilol 25 MG tablet Commonly known as: COREG Take 1 tablet (25 mg total) by mouth 2 (two) times daily with a meal.   cloNIDine 0.1 MG tablet Commonly known as: CATAPRES Take 0.1 mg by mouth 3 (three) times daily.   ferric citrate 1 GM 210 MG(Fe) tablet Commonly known as: AURYXIA Take 2 tablets (420 mg total) by mouth 3 (three) times daily with meals.   hydrOXYzine 25 MG tablet Commonly known as: ATARAX/VISTARIL Take 25 mg by mouth 2 (two) times daily as needed for itching.   isosorbide mononitrate 30 MG 24 hr tablet Commonly known as: IMDUR Take 1 tablet (30 mg total) by mouth 2 (two) times daily.   losartan 100 MG tablet Commonly known as: COZAAR Take 100 mg by mouth every evening.   oxyCODONE-acetaminophen 5-325 MG tablet Commonly known as: Percocet Take 1 tablet by mouth every 6 (six) hours as needed for moderate pain or severe pain.   torsemide 20 MG tablet Commonly known as: DEMADEX Take 40 mg by mouth daily.       Today  Patient seen and evaluated today Tolerating diet well No fevers Hemodynamically stable VITAL SIGNS:  Blood pressure (!) 153/94, pulse 99, temperature 98.9 F (37.2 C), temperature source Oral, resp. rate 20, height _0  (1.676 m), weight 131.1 kg, last menstrual period 06/05/2019, SpO2 97 %.  I/O:    Intake/Output Summary (Last 24 hours) at 07/10/2019 1336 Last data filed at 07/10/2019 0900 Gross per 24 hour  Intake 690.5 ml  Output 300 ml  Net 390.5 ml    PHYSICAL EXAMINATION:   Physical Exam  GENERAL:  33 y.o.-year-old patient lying in the bed with no acute distress.  LUNGS: Normal breath sounds bilaterally, no wheezing, rales,rhonchi or crepitation. No use of accessory muscles of respiration.  CARDIOVASCULAR: S1, S2 normal. No murmurs, rubs, or gallops.  ABDOMEN: Soft, non-tender, non-distended. Bowel sounds present. No organomegaly or mass.  NEUROLOGIC: Moves all 4 extremities. PSYCHIATRIC: The patient is alert and oriented x 3.  SKIN: Lower extremities swelling improved  DATA REVIEW:   CBC Recent Labs  Lab 07/10/19 0351  WBC 23.2*  HGB 7.4*  HCT 23.7*  PLT 401*    Chemistries  Recent Labs  Lab 07/06/19 0440  07/10/19 0351  NA 140   < > 141  K 4.6   < > 4.6  CL 95*   < > 96*  CO2 21*   < > 25  GLUCOSE 110*   < > 101*  BUN 43*   < > 39*  CREATININE 8.60*   < > 7.41*  CALCIUM 8.8*   < > 9.5  MG 1.8  --   --    < > = values in this interval not displayed.    Cardiac Enzymes No  results for input(s): TROPONINI in the last 168 hours.  Microbiology Results  Results for orders placed or performed during the hospital encounter of 07/02/19  Culture, blood (Routine x 2)     Status: None   Collection Time: 07/02/19  8:08 PM   Specimen: BLOOD  Result Value Ref Range Status   Specimen Description BLOOD LEFT ANTECUBITAL  Final   Special Requests   Final    BOTTLES DRAWN AEROBIC AND ANAEROBIC Blood Culture results may not be optimal due to an excessive volume of blood received in culture bottles   Culture   Final    NO GROWTH 5 DAYS Performed at [facility], [address], [place], [place] [address]    Report Status 07/07/2019 FINAL  Final  Culture, blood (Routine x 2)     Status: None   Collection Time: 07/02/19  8:08 PM   Specimen: BLOOD  Result Value Ref Range Status   Specimen Description BLOOD BLOOD LEFT HAND  Final   Special Requests   Final    BOTTLES DRAWN AEROBIC AND ANAEROBIC Blood Culture adequate volume   Culture    Final    NO GROWTH 5 DAYS Performed at [facility], [address], [place], [place] [address]    Report Status 07/07/2019 FINAL  Final  SARS CORONAVIRUS 2 (TAT 6-24 HRS) Nasopharyngeal Nasopharyngeal Swab     Status: None   Collection Time: 07/02/19 10:48 PM   Specimen: Nasopharyngeal Swab  Result Value Ref Range Status   SARS Coronavirus 2 NEGATIVE NEGATIVE Final    Comment: (NOTE) SARS-CoV-2 target nucleic acids are NOT DETECTED. The SARS-CoV-2 RNA is generally detectable in upper and lower respiratory specimens during the acute phase of infection. Negative results do not preclude SARS-CoV-2 infection, do not rule out co-infections with other pathogens, and should not be used as the sole basis for treatment or other patient management decisions. Negative results must be combined with clinical observations, patient history, and epidemiological information. The expected result is Negative. Fact Sheet for Patients: SugarRoll.be Fact Sheet for Healthcare Providers: https://www.woods-mathews.com/ This test is not yet approved or cleared by the [place] FDA and  has been authorized for detection and/or diagnosis of SARS-CoV-2 by FDA under an Emergency Use Authorization (EUA). This EUA will remain  in effect (meaning this test can be used) for the duration of the COVID-19 declaration under Section 56 4(b)(1) of the Act, 21 U.S.C. section 360bbb-3(b)(1), unless the authorization is terminated or revoked sooner. Performed at [facility], [place] [address], [place], [place] [address]   MRSA PCR Screening     Status: None   Collection Time: 07/07/19  8:41 AM   Specimen: Nasopharyngeal  Result Value Ref Range Status   MRSA by PCR NEGATIVE NEGATIVE Final    Comment:        The GeneXpert MRSA Assay (FDA approved for NASAL specimens only), is one component of a comprehensive MRSA colonization surveillance program. It is  not intended to diagnose MRSA infection nor to guide or monitor treatment for MRSA infections. Performed at [facility], [address], [place], [place] [address]     RADIOLOGY:  No results found.  Follow up with PCP in 1 week.  Management plans discussed with the patient, family and they are in agreement.  CODE STATUS: Full code    Code Status Orders  (From admission, onward)         Start     Ordered  07/02/19 2244  Full code  Continuous     07/02/19 2245        Code Status History    Date Active Date Inactive Code Status Order ID Comments User Context   02/11/2016 0707 02/16/2016 1708 Full Code [id number]  Saundra Shelling, MD Inpatient   Advance Care Planning Activity      TOTAL TIME TAKING CARE OF THIS PATIENT ON DAY OF DISCHARGE: more than 35 minutes.   Saundra Shelling M.D on 07/10/2019 at 1:36 PM  Between 7am to 6pm - Pager - [phone]  After 6pm go to [email or web address] - password [email or web address] [facility]  Office  [phone]  CC: Primary care physician; [facility]  Note: This dictation was prepared with Dragon dictation along with smaller phrase technology. Any transcriptional errors that result from this process are unintentional.

## 2019-07-10 NOTE — Progress Notes (Signed)
[name]  A and O x 4. VSS. Pt tolerating diet well. No complaints of pain or nausea. IV removed intact, prescriptions given. Pt voiced understanding of discharge instructions with no further questions. Pt discharged via wheelchair with RN.    Allergies as of 07/10/2019   No Known Allergies     Medication List    STOP taking these medications   calcium acetate 667 MG capsule Commonly known as: PHOSLO     TAKE these medications   amLODipine 10 MG tablet Commonly known as: NORVASC Take 10 mg by mouth daily.   aspirin 81 MG EC tablet Take 1 tablet (81 mg total) by mouth daily.   carvedilol 25 MG tablet Commonly known as: COREG Take 1 tablet (25 mg total) by mouth 2 (two) times daily with a meal.   cloNIDine 0.1 MG tablet Commonly known as: CATAPRES Take 0.1 mg by mouth 3 (three) times daily.   ferric citrate 1 GM 210 MG(Fe) tablet Commonly known as: AURYXIA Take 2 tablets (420 mg total) by mouth 3 (three) times daily with meals.   hydrOXYzine 25 MG tablet Commonly known as: ATARAX/VISTARIL Take 25 mg by mouth 2 (two) times daily as needed for itching.   isosorbide mononitrate 30 MG 24 hr tablet Commonly known as: IMDUR Take 1 tablet (30 mg total) by mouth 2 (two) times daily.   losartan 100 MG tablet Commonly known as: COZAAR Take 100 mg by mouth every evening.   oxyCODONE-acetaminophen 5-325 MG tablet Commonly known as: Percocet Take 1 tablet by mouth every 6 (six) hours as needed for moderate pain or severe pain.   torsemide 20 MG tablet Commonly known as: DEMADEX Take 40 mg by mouth daily.       Vitals:   07/10/19 0419 07/10/19 0844  BP: (!) 150/93 (!) 153/94  Pulse: 97 99  Resp: 20   Temp: 98.9 F (37.2 C)   SpO2: 97%     Francesco Sor

## 2019-07-11 NOTE — Progress Notes (Signed)
[place] [facility] [place] was admitted to the Hospital on 07/02/2019 and Discharged  07/11/2019 and should be excused from work/school   for 9 days starting 07/02/2019 , may return to work/school without any restrictions.  Demetrios Loll M.D on 07/11/2019,at 6:04 PM  [place] at [facility]  [phone]

## 2019-07-18 DIAGNOSIS — J45909 Unspecified asthma, uncomplicated: Secondary | ICD-10-CM | POA: Insufficient documentation

## 2019-08-02 ENCOUNTER — Ambulatory Visit: Payer: Medicare Other | Admitting: Family

## 2019-08-19 ENCOUNTER — Telehealth: Payer: Self-pay | Admitting: Family

## 2019-08-19 ENCOUNTER — Ambulatory Visit: Payer: Medicare Other | Admitting: Family

## 2019-08-19 NOTE — Telephone Encounter (Signed)
Patient did not show for her [facility] Failure [facility] appointment on 08/19/2019. Will attempt to reschedule.

## 2019-09-04 ENCOUNTER — Other Ambulatory Visit: Payer: Self-pay | Admitting: Dermatology

## 2019-09-14 ENCOUNTER — Emergency Department: Payer: Medicare Other

## 2019-09-14 ENCOUNTER — Encounter: Payer: Self-pay | Admitting: Intensive Care

## 2019-09-14 ENCOUNTER — Other Ambulatory Visit: Payer: Self-pay

## 2019-09-14 ENCOUNTER — Inpatient Hospital Stay
Admission: EM | Admit: 2019-09-14 | Discharge: 2019-09-18 | DRG: 987 | Disposition: A | Discharge status: Home Health | Payer: Medicare Other | Attending: Internal Medicine | Admitting: Internal Medicine

## 2019-09-14 DIAGNOSIS — N186 End stage renal disease: Secondary | ICD-10-CM | POA: Diagnosis present

## 2019-09-14 DIAGNOSIS — N2581 Secondary hyperparathyroidism of renal origin: Secondary | ICD-10-CM | POA: Diagnosis present

## 2019-09-14 DIAGNOSIS — I96 Gangrene, not elsewhere classified: Principal | ICD-10-CM | POA: Diagnosis present

## 2019-09-14 DIAGNOSIS — L03115 Cellulitis of right lower limb: Secondary | ICD-10-CM | POA: Diagnosis not present

## 2019-09-14 DIAGNOSIS — L02415 Cutaneous abscess of right lower limb: Secondary | ICD-10-CM | POA: Diagnosis present

## 2019-09-14 DIAGNOSIS — I10 Essential (primary) hypertension: Secondary | ICD-10-CM | POA: Diagnosis not present

## 2019-09-14 DIAGNOSIS — Z820 Family history of epilepsy and other diseases of the nervous system: Secondary | ICD-10-CM | POA: Diagnosis not present

## 2019-09-14 DIAGNOSIS — Z20828 Contact with and (suspected) exposure to other viral communicable diseases: Secondary | ICD-10-CM | POA: Diagnosis present

## 2019-09-14 DIAGNOSIS — L03116 Cellulitis of left lower limb: Secondary | ICD-10-CM

## 2019-09-14 DIAGNOSIS — D631 Anemia in chronic kidney disease: Secondary | ICD-10-CM | POA: Diagnosis present

## 2019-09-14 DIAGNOSIS — Z8249 Family history of ischemic heart disease and other diseases of the circulatory system: Secondary | ICD-10-CM

## 2019-09-14 DIAGNOSIS — E1152 Type 2 diabetes mellitus with diabetic peripheral angiopathy with gangrene: Secondary | ICD-10-CM | POA: Diagnosis not present

## 2019-09-14 DIAGNOSIS — Z992 Dependence on renal dialysis: Secondary | ICD-10-CM | POA: Diagnosis not present

## 2019-09-14 DIAGNOSIS — R7301 Impaired fasting glucose: Secondary | ICD-10-CM | POA: Diagnosis present

## 2019-09-14 DIAGNOSIS — Z6841 Body Mass Index (BMI) 40.0 and over, adult: Secondary | ICD-10-CM | POA: Diagnosis not present

## 2019-09-14 DIAGNOSIS — I5032 Chronic diastolic (congestive) heart failure: Secondary | ICD-10-CM | POA: Diagnosis present

## 2019-09-14 DIAGNOSIS — L02416 Cutaneous abscess of left lower limb: Secondary | ICD-10-CM | POA: Diagnosis present

## 2019-09-14 DIAGNOSIS — L089 Local infection of the skin and subcutaneous tissue, unspecified: Secondary | ICD-10-CM | POA: Diagnosis present

## 2019-09-14 DIAGNOSIS — Z8041 Family history of malignant neoplasm of ovary: Secondary | ICD-10-CM

## 2019-09-14 DIAGNOSIS — Z841 Family history of disorders of kidney and ureter: Secondary | ICD-10-CM | POA: Diagnosis not present

## 2019-09-14 DIAGNOSIS — Z79899 Other long term (current) drug therapy: Secondary | ICD-10-CM | POA: Diagnosis not present

## 2019-09-14 DIAGNOSIS — E119 Type 2 diabetes mellitus without complications: Secondary | ICD-10-CM | POA: Diagnosis not present

## 2019-09-14 DIAGNOSIS — Z8042 Family history of malignant neoplasm of prostate: Secondary | ICD-10-CM

## 2019-09-14 DIAGNOSIS — Z978 Presence of other specified devices: Secondary | ICD-10-CM | POA: Diagnosis not present

## 2019-09-14 DIAGNOSIS — Z87891 Personal history of nicotine dependence: Secondary | ICD-10-CM

## 2019-09-14 DIAGNOSIS — I132 Hypertensive heart and chronic kidney disease with heart failure and with stage 5 chronic kidney disease, or end stage renal disease: Secondary | ICD-10-CM | POA: Diagnosis not present

## 2019-09-14 DIAGNOSIS — Z7982 Long term (current) use of aspirin: Secondary | ICD-10-CM | POA: Diagnosis not present

## 2019-09-14 DIAGNOSIS — N189 Chronic kidney disease, unspecified: Secondary | ICD-10-CM | POA: Diagnosis present

## 2019-09-14 DIAGNOSIS — E1122 Type 2 diabetes mellitus with diabetic chronic kidney disease: Secondary | ICD-10-CM | POA: Diagnosis not present

## 2019-09-14 LAB — CBC WITH DIFFERENTIAL/PLATELET
Abs Immature Granulocytes: 0.07 10*3/uL (ref 0.00–0.07)
Basophils Absolute: 0.1 10*3/uL (ref 0.0–0.1)
Basophils Relative: 1 %
Eosinophils Absolute: 0.6 10*3/uL — ABNORMAL HIGH (ref 0.0–0.5)
Eosinophils Relative: 4 %
HCT: 25.5 % — ABNORMAL LOW (ref 36.0–46.0)
Hemoglobin: 8.4 g/dL — ABNORMAL LOW (ref 12.0–15.0)
Immature Granulocytes: 1 %
Lymphocytes Relative: 12 %
Lymphs Abs: 1.8 10*3/uL (ref 0.7–4.0)
MCH: 29.7 pg (ref 26.0–34.0)
MCHC: 32.9 g/dL (ref 30.0–36.0)
MCV: 90.1 fL (ref 80.0–100.0)
Monocytes Absolute: 0.7 10*3/uL (ref 0.1–1.0)
Monocytes Relative: 5 %
Neutro Abs: 11.9 10*3/uL — ABNORMAL HIGH (ref 1.7–7.7)
Neutrophils Relative %: 77 %
Platelets: 288 10*3/uL (ref 150–400)
RBC: 2.83 MIL/uL — ABNORMAL LOW (ref 3.87–5.11)
RDW: 16.2 % — ABNORMAL HIGH (ref 11.5–15.5)
WBC: 15.1 10*3/uL — ABNORMAL HIGH (ref 4.0–10.5)
nRBC: 0 % (ref 0.0–0.2)

## 2019-09-14 LAB — COMPREHENSIVE METABOLIC PANEL
ALT: 16 U/L (ref 0–44)
AST: 14 U/L — ABNORMAL LOW (ref 15–41)
Albumin: 3 g/dL — ABNORMAL LOW (ref 3.5–5.0)
Alkaline Phosphatase: 81 U/L (ref 38–126)
Anion gap: 14 (ref 5–15)
BUN: 57 mg/dL — ABNORMAL HIGH (ref 6–20)
CO2: 21 mmol/L — ABNORMAL LOW (ref 22–32)
Calcium: 8 mg/dL — ABNORMAL LOW (ref 8.9–10.3)
Chloride: 103 mmol/L (ref 98–111)
Creatinine, Ser: 8.91 mg/dL — ABNORMAL HIGH (ref 0.44–1.00)
GFR calc Af Amer: 6 mL/min — ABNORMAL LOW (ref >60)
GFR calc non Af Amer: 5 mL/min — ABNORMAL LOW (ref >60)
Glucose, Bld: 124 mg/dL — ABNORMAL HIGH (ref 70–99)
Potassium: 3.4 mmol/L — ABNORMAL LOW (ref 3.5–5.1)
Sodium: 138 mmol/L (ref 135–145)
Total Bilirubin: 0.8 mg/dL (ref 0.3–1.2)
Total Protein: 7.4 g/dL (ref 6.5–8.1)

## 2019-09-14 LAB — LACTIC ACID, PLASMA
Lactic Acid, Venous: 1.3 mmol/L (ref 0.5–1.9)
Lactic Acid, Venous: 1.6 mmol/L (ref 0.5–1.9)

## 2019-09-14 LAB — SEDIMENTATION RATE: Sed Rate: 90 mm/hr — ABNORMAL HIGH (ref 0–20)

## 2019-09-14 MED ORDER — ACETAMINOPHEN 650 MG RE SUPP: 650.0000 mg | Freq: Four times a day (QID) | RECTAL | Status: DC | PRN

## 2019-09-14 MED ORDER — ASPIRIN EC 81 MG PO TBEC: 81.0000 mg | DELAYED_RELEASE_TABLET | Freq: Every day | ORAL | Status: DC

## 2019-09-14 MED ORDER — VANCOMYCIN HCL IN DEXTROSE 1-5 GM/200ML-% IV SOLN
1000.0000 mg | Freq: Once | INTRAVENOUS | Status: AC
  Administered 2019-09-14: 19:00:00 1000 mg via INTRAVENOUS
  Filled 2019-09-14: qty 200

## 2019-09-14 MED ORDER — VANCOMYCIN HCL IN DEXTROSE 1-5 GM/200ML-% IV SOLN
1000.0000 mg | INTRAVENOUS | Status: DC
  Filled 2019-09-14 (×2): qty 200

## 2019-09-14 MED ORDER — CARVEDILOL 25 MG PO TABS
25.0000 mg | ORAL_TABLET | Freq: Two times a day (BID) | ORAL | Status: DC
  Administered 2019-09-14 – 2019-09-18 (×7): 25 mg via ORAL
  Filled 2019-09-14 (×7): qty 1

## 2019-09-14 MED ORDER — VANCOMYCIN HCL IN DEXTROSE 1-5 GM/200ML-% IV SOLN: 1000.0000 mg | Freq: Once | INTRAVENOUS | Status: DC

## 2019-09-14 MED ORDER — TORSEMIDE 20 MG PO TABS
40.0000 mg | ORAL_TABLET | Freq: Every day | ORAL | Status: DC
  Administered 2019-09-15 – 2019-09-18 (×4): 40 mg via ORAL
  Filled 2019-09-14 (×4): qty 2

## 2019-09-14 MED ORDER — OXYCODONE HCL 5 MG PO TABS
5.0000 mg | ORAL_TABLET | ORAL | Status: DC | PRN
  Administered 2019-09-14 – 2019-09-18 (×5): 5 mg via ORAL
  Filled 2019-09-14 (×5): qty 1

## 2019-09-14 MED ORDER — ISOSORBIDE MONONITRATE ER 30 MG PO TB24
30.0000 mg | ORAL_TABLET | Freq: Two times a day (BID) | ORAL | Status: DC
  Administered 2019-09-14 – 2019-09-18 (×7): 30 mg via ORAL
  Filled 2019-09-14 (×7): qty 1

## 2019-09-14 MED ORDER — SODIUM CHLORIDE 0.9 % IV SOLN
1.0000 g | INTRAVENOUS | Status: DC
  Administered 2019-09-15 – 2019-09-18 (×4): 1 g via INTRAVENOUS
  Filled 2019-09-14 (×4): qty 1

## 2019-09-14 MED ORDER — LOSARTAN POTASSIUM 50 MG PO TABS
100.0000 mg | ORAL_TABLET | Freq: Every evening | ORAL | Status: DC
  Administered 2019-09-14 – 2019-09-17 (×4): 100 mg via ORAL
  Filled 2019-09-14 (×4): qty 2

## 2019-09-14 MED ORDER — FERRIC CITRATE 1 GM 210 MG(FE) PO TABS
420.0000 mg | ORAL_TABLET | Freq: Three times a day (TID) | ORAL | Status: DC
  Administered 2019-09-14 – 2019-09-18 (×8): 420 mg via ORAL
  Filled 2019-09-14 (×13): qty 2

## 2019-09-14 MED ORDER — ACETAMINOPHEN 325 MG PO TABS
650.0000 mg | ORAL_TABLET | Freq: Four times a day (QID) | ORAL | Status: DC | PRN
  Administered 2019-09-15 – 2019-09-17 (×3): 650 mg via ORAL
  Filled 2019-09-14 (×2): qty 2

## 2019-09-14 MED ORDER — METRONIDAZOLE IN NACL 5-0.79 MG/ML-% IV SOLN
500.0000 mg | Freq: Three times a day (TID) | INTRAVENOUS | Status: DC
  Administered 2019-09-14 – 2019-09-18 (×11): 500 mg via INTRAVENOUS
  Filled 2019-09-14 (×14): qty 100

## 2019-09-14 MED ORDER — ONDANSETRON HCL 4 MG/2ML IJ SOLN
4.0000 mg | Freq: Four times a day (QID) | INTRAMUSCULAR | Status: DC | PRN
  Administered 2019-09-16 – 2019-09-17 (×3): 4 mg via INTRAVENOUS
  Filled 2019-09-14 (×3): qty 2

## 2019-09-14 MED ORDER — AMLODIPINE BESYLATE 10 MG PO TABS
10.0000 mg | ORAL_TABLET | Freq: Every day | ORAL | Status: DC
  Administered 2019-09-15 – 2019-09-18 (×4): 10 mg via ORAL
  Filled 2019-09-14 (×4): qty 1

## 2019-09-14 MED ORDER — GABAPENTIN 100 MG PO CAPS
100.0000 mg | ORAL_CAPSULE | Freq: Every day | ORAL | Status: DC
  Administered 2019-09-14 – 2019-09-17 (×4): 100 mg via ORAL
  Filled 2019-09-14 (×4): qty 1

## 2019-09-14 MED ORDER — CLONIDINE HCL 0.1 MG PO TABS
0.1000 mg | ORAL_TABLET | Freq: Three times a day (TID) | ORAL | Status: DC
  Administered 2019-09-14 – 2019-09-18 (×9): 0.1 mg via ORAL
  Filled 2019-09-14 (×9): qty 1

## 2019-09-14 MED ORDER — ONDANSETRON HCL 4 MG PO TABS: 4.0000 mg | ORAL_TABLET | Freq: Four times a day (QID) | ORAL | Status: DC | PRN

## 2019-09-14 MED ORDER — SODIUM CHLORIDE 0.9 % IV SOLN
2.0000 g | Freq: Once | INTRAVENOUS | Status: AC
  Administered 2019-09-14: 2 g via INTRAVENOUS
  Filled 2019-09-14: qty 20

## 2019-09-14 NOTE — ED Notes (Signed)
Pt declined to have CXR done at this time, wants to talk to EDP before x-ray. Pt states she feels like her breathing is better.

## 2019-09-14 NOTE — H&P (Signed)
[name] [facility] [place] NAME: [name]    MR#:  [id number]  DATE OF BIRTH:  01-17-1986  DATE OF ADMISSION:  09/14/2019  PRIMARY CARE PHYSICIAN: [facility]   REQUESTING/REFERRING PHYSICIAN: Dr Harvest Dark  CHIEF COMPLAINT:   Chief Complaint  Patient presents with  . Shortness of Breath  . Leg Pain    bilateral    HISTORY OF PRESENT ILLNESS:  [name]  is a 33 y.o. female with a known history of end-stage renal disease and calciphylaxis.  She started to notice a foul odor from her calciphylaxis ulcers on her lower extremities going on now for 3 days.  She has had sharp pains in her legs up to 7 out of 10 in intensity.  Patient has not had any fever at home.  Does feel little fatigued.  Did have some shortness of breath prior to dialysis today.  Received a full dialysis session and referred to the ER after dialysis today.  Hospitalist services were contacted for evaluation for infected lower extremity ulcers.  PAST MEDICAL HISTORY:   Past Medical History:  Diagnosis Date  . Anemia of chronic disease   . Asthma   . CHF (congestive heart failure) ([place])   . CKD (chronic kidney disease), stage III   . Diabetes ([place])    type 2  . Hypertension   . Morbid obesity ([place])     PAST SURGICAL HISTORY:   Past Surgical History:  Procedure Laterality Date  . DIALYSIS/PERMA CATHETER INSERTION N/A 02/28/2019   Procedure: DIALYSIS/PERMA CATHETER INSERTION;  Surgeon: Algernon Huxley, MD;  Location: [facility];  Service: Cardiovascular;  Laterality: N/A;  . pd cath    . TONSILLECTOMY      SOCIAL HISTORY:   Social History   Tobacco Use  . Smoking status: Former Smoker    Types: Cigars  . Smokeless tobacco: Never Used  Substance Use Topics  . Alcohol use: No    Alcohol/week: 0.0 standard drinks    FAMILY HISTORY:   Family History  Problem Relation Age of Onset  . Hypertension Mother   . Multiple  sclerosis Mother   . CAD Mother   . Cancer Maternal Grandmother        Ovarian   . Cancer Maternal Grandfather        Prostate   . Kidney disease Father     DRUG ALLERGIES:  No Known Allergies  REVIEW OF SYSTEMS:  CONSTITUTIONAL: No fever, chills or sweats.  Positive for fatigue.  EYES: No blurred or double vision.  EARS, NOSE, AND THROAT: No tinnitus or ear pain. No sore throat RESPIRATORY: No cough.  Some shortness of breath prior to dialysis today but that is better now.  No wheezing or hemoptysis.  CARDIOVASCULAR: No chest pain, orthopnea, edema.  GASTROINTESTINAL: No nausea, vomiting, diarrhea or abdominal pain. No blood in bowel movements GENITOURINARY: No dysuria, hematuria.  ENDOCRINE: No polyuria, nocturia,  HEMATOLOGY: No anemia, easy bruising or bleeding SKIN: No rash or lesion. MUSCULOSKELETAL: Positive leg pain at the ulcers. NEUROLOGIC: No tingling, numbness, weakness.  PSYCHIATRY: No anxiety or depression.   MEDICATIONS AT HOME:   Prior to Admission medications   Medication Sig Start Date End Date Taking? Authorizing Provider  amLODipine (NORVASC) 10 MG tablet Take 10 mg by mouth daily.   Yes [provider]  aspirin EC 81 MG EC tablet Take 1 tablet (81 mg total) by mouth daily. 02/16/16  Yes Bridgett Larsson,  Sheppard Evens, MD  carvedilol (COREG) 25 MG tablet Take 1 tablet (25 mg total) by mouth 2 (two) times daily with a meal. 02/20/18  Yes Darylene Price A, FNP  cloNIDine (CATAPRES) 0.1 MG tablet Take 0.1 mg by mouth 3 (three) times daily.   Yes [provider]  ferric citrate (AURYXIA) 1 GM 210 MG(Fe) tablet Take 420 mg by mouth 3 (three) times daily. 07/18/19  Yes [provider]  gabapentin (NEURONTIN) 100 MG capsule Take 100 mg by mouth at bedtime. 09/10/19  Yes [provider]  isosorbide mononitrate (IMDUR) 30 MG 24 hr tablet Take 1 tablet (30 mg total) by mouth 2 (two) times daily. 02/20/18  Yes Hackney, Otila Kluver A, FNP  losartan (COZAAR) 100 MG  tablet Take 100 mg by mouth every evening.    Yes [provider]  torsemide (DEMADEX) 20 MG tablet Take 40 mg by mouth daily.    Yes [provider]      VITAL SIGNS:  Blood pressure (!) 167/99, pulse 95, temperature 98.9 F (37.2 C), temperature source Oral, resp. rate (!) 24, height 5\' 6"  (1.676 m), weight 127 kg, last menstrual period 09/04/2019, SpO2 100 %.  PHYSICAL EXAMINATION:  GENERAL:  33 y.o.-year-old patient lying in the bed with no acute distress.  EYES: Pupils equal, round, reactive to light and accommodation. No scleral icterus. Extraocular muscles intact.  HEENT: Head atraumatic, normocephalic. Oropharynx and nasopharynx clear.  NECK:  Supple, no jugular venous distention. No thyroid enlargement, no tenderness.  LUNGS: Normal breath sounds bilaterally, no wheezing, rales,rhonchi or crepitation. No use of accessory muscles of respiration.  CARDIOVASCULAR: S1, S2 normal. No murmurs, rubs, or gallops.  ABDOMEN: Soft, nontender, nondistended. Bowel sounds present. No organomegaly or mass.  EXTREMITIES: Trace pedal edema, no cyanosis, or clubbing.  NEUROLOGIC: Cranial nerves II through XII are intact. Muscle strength 5/5 in all extremities. Sensation intact. Gait not checked.  PSYCHIATRIC: The patient is alert and oriented x 3.  SKIN: left leg    Right leg  LABORATORY PANEL:   CBC Recent Labs  Lab 09/14/19 1459  WBC 15.1*  HGB 8.4*  HCT 25.5*  PLT 288   ------------------------------------------------------------------------------------------------------------------  Chemistries  Recent Labs  Lab 09/14/19 1459  NA 138  K 3.4*  CL 103  CO2 21*  GLUCOSE 124*  BUN 57*  CREATININE 8.91*  CALCIUM 8.0*  AST 14*  ALT 16  ALKPHOS 81  BILITOT 0.8   ------------------------------------------------------------------------------------------------------------------     EKG:   EKG interpreted by me.  Normal sinus rhythm 96 bpm, left atrial  enlargement, flipped T waves laterally, QT 406.  IMPRESSION AND PLAN:   1.  Bilateral lower extremity infected ulcers with history of calciphylaxis.  Blood cultures.  Wound culture.  Surgical consultation for possible debridement.  N.p.o. after midnight.  Covid test pending.  Empiric antibiotics with vancomycin cefepime and Flagyl.  Check a sedimentation rate.  Hold aspirin and DVT prophylaxis until after debridement. 2.  Essential hypertension continue usual medications and current schedule. 3.  End-stage renal disease on dialysis Tuesday Thursday and Saturday.  Nephrology consultation. 4.  Chronic diastolic congestive heart failure.  No signs of heart failure currently.  Just received dialysis today.  Patient on Coreg and torsemide. 5.  Morbid obesity with a BMI of 45.19. 6.  Impaired fasting glucose.  Last hemoglobin A1c 5.1.  Patient not a diabetic at this time.   All the records are reviewed and case discussed with ED provider. Management plans discussed with  the patient, family and they are in agreement.  CODE STATUS: Full code  TOTAL TIME TAKING CARE OF THIS PATIENT: 50 minutes.    Loletha Grayer M.D on 09/14/2019 at 6:24 PM  Between 7am to 6pm - Pager - [phone]  After 6pm call admission pager [phone]  [facility]  CC: Primary care physician; [place]

## 2019-09-14 NOTE — Progress Notes (Signed)
PHARMACY -  BRIEF ANTIBIOTIC NOTE   Pharmacy has received consult(s) for Vancomycin and Cefepime from an ED provider.  The patient's profile has been reviewed for ht/wt/allergies/indication/available labs.    One time order(s) placed for Vancomycin 1g and Cefepime 2g x 1 each.  Further antibiotics/pharmacy consults should be ordered by admitting physician if indicated.                       Thank you, [name] 09/14/2019  5:56 PM

## 2019-09-14 NOTE — Progress Notes (Signed)
CODE SEPSIS - PHARMACY COMMUNICATION  **Broad Spectrum Antibiotics should be administered within 1 hour of Sepsis diagnosis**  Time Code Sepsis Called/Page Received: 1748  Antibiotics Ordered: Rocephin,Flagyl,Vancomycin  Time of 1st antibiotic administration: 1829  Additional action taken by pharmacy: none  If necessary, Name of Provider/Nurse Contacted: n/a    [name] ,PharmD Clinical Pharmacist  09/14/2019  6:44 PM

## 2019-09-14 NOTE — ED Notes (Signed)
Attempted to call report to [facility]. Receiving nurse declined to take report due to elevated blood pressure.

## 2019-09-14 NOTE — ED Triage Notes (Addendum)
Patient c/o sob X 2-3 days and has Calciphylaxis present on left and right leg. Left leg worse than right. Dialysis patient T,TH,Sat. Reports getting three hours of treatment today and left early due to not feeling well. A&O x4 in triage

## 2019-09-14 NOTE — Progress Notes (Signed)
Notified bedside nurse of need to draw lactic acid and blood cultures.  

## 2019-09-14 NOTE — ED Notes (Signed)
General Surgeon at bedside.

## 2019-09-14 NOTE — ED Notes (Signed)
ED TO INPATIENT HANDOFF REPORT  ED Nurse Name and Phone #:  Gershon Mussel RN  [phone]  S Name/Age/Gender [name] [name] 33 y.o. female Room/Bed: ED10A/ED10A  Code Status   Code Status: Full Code  Home/SNF/Other Home Patient oriented to: self, place, time and situation Is this baseline? 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 Left leg worse than right. Dialysis patient T,TH,Sat. Reports getting three hours of treatment today and left early due to not feeling well. A&O x4 in triage   Allergies No Known Allergies  Level of Care/Admitting Diagnosis ED Disposition    ED Disposition Condition [place]: [name] [[id number]]  Level of Care: Med-Surg [16]  Covid Evaluation: Asymptomatic Screening Protocol (No Symptoms)  Diagnosis: Infection of anterior lower leg [id number]  Admitting Physician: Loletha Grayer [id number]  Attending Physician: Loletha Grayer [phone]  Estimated length of stay: past midnight tomorrow  Certification:: I certify this patient will need inpatient services for at least 2 midnights  PT Class (Do Not Modify): Inpatient [101]  PT Acc Code (Do Not Modify): Private [1]       B Medical/Surgery History Past Medical History:  Diagnosis Date  . Anemia of chronic disease   . Asthma   . CHF (congestive heart failure) ([place])   . CKD (chronic kidney disease), stage III   . Diabetes ([place])    type 2  . Hypertension   . Morbid obesity ([place])    Past Surgical History:  Procedure Laterality Date  . DIALYSIS/PERMA CATHETER INSERTION N/A 02/28/2019   Procedure: DIALYSIS/PERMA CATHETER INSERTION;  Surgeon: Algernon Huxley, MD;  Location: [place] [facility];  Service: Cardiovascular;  Laterality: N/A;  . pd cath    . TONSILLECTOMY       A IV Location/Drains/Wounds Patient Lines/Drains/Airways Status    Active Line/Drains/Airways    Name:   Placement date:   Placement time:   Site:   Days:   Peripheral IV 09/14/19 Right Antecubital   09/14/19    1829    Antecubital   less than 1   Hemodialysis Catheter Right Internal jugular Double-lumen   02/28/19    -    Internal jugular   198          Intake/Output Last 24 hours  Intake/Output Summary (Last 24 hours) at 09/14/2019 2027 Last data filed at 09/14/2019 2008 Gross per 24 hour  Intake 300 ml  Output -  Net 300 ml    Labs/Imaging Results for orders placed or performed during the hospital encounter of 09/14/19 (from the past 48 hour(s))  CBC with Differential     Status: Abnormal   Collection Time: 09/14/19  2:59 PM  Result Value Ref Range   WBC 15.1 (H) 4.0 - 10.5 K/uL   RBC 2.83 (L) 3.87 - 5.11 MIL/uL   Hemoglobin 8.4 (L) 12.0 - 15.0 g/dL   HCT 25.5 (L) 36.0 - 46.0 %   MCV 90.1 80.0 - 100.0 fL   MCH 29.7 26.0 - 34.0 pg   MCHC 32.9 30.0 - 36.0 g/dL   RDW 16.2 (H) 11.5 - 15.5 %   Platelets 288 150 - 400 K/uL   nRBC 0.0 0.0 - 0.2 %   Neutrophils Relative % 77 %   Neutro Abs 11.9 (H) 1.7 - 7.7 K/uL   Lymphocytes Relative 12 %  Lymphs Abs 1.8 0.7 - 4.0 K/uL   Monocytes Relative 5 %   Monocytes Absolute 0.7 0.1 - 1.0 K/uL   Eosinophils Relative 4 %   Eosinophils Absolute 0.6 (H) 0.0 - 0.5 K/uL   Basophils Relative 1 %   Basophils Absolute 0.1 0.0 - 0.1 K/uL   Immature Granulocytes 1 %   Abs Immature Granulocytes 0.07 0.00 - 0.07 K/uL    Comment: Performed at [facility], [address] [place], [place] [address]  Comprehensive metabolic panel     Status: Abnormal   Collection Time: 09/14/19  2:59 PM  Result Value Ref Range   Sodium 138 135 - 145 mmol/L   Potassium 3.4 (L) 3.5 - 5.1 mmol/L   Chloride 103 98 - 111 mmol/L   CO2 21 (L) 22 - 32 mmol/L   Glucose, Bld 124 (H) 70 - 99 mg/dL   BUN 57 (H) 6 - 20 mg/dL   Creatinine, Ser 8.91 (H) 0.44 - 1.00 mg/dL   Calcium 8.0 (L) 8.9 - 10.3 mg/dL   Total Protein  7.4 6.5 - 8.1 g/dL   Albumin 3.0 (L) 3.5 - 5.0 g/dL   AST 14 (L) 15 - 41 U/L   ALT 16 0 - 44 U/L   Alkaline Phosphatase 81 38 - 126 U/L   Total Bilirubin 0.8 0.3 - 1.2 mg/dL   GFR calc non Af Amer 5 (L) >60 mL/min   GFR calc Af Amer 6 (L) >60 mL/min   Anion gap 14 5 - 15    Comment: Performed at [facility], [address] [place], [place] [address]  Lactic acid, plasma     Status: None   Collection Time: 09/14/19  6:26 PM  Result Value Ref Range   Lactic Acid, Venous 1.3 0.5 - 1.9 mmol/L    Comment: Performed at [facility], [address] [place],  [address]   No results found.  Pending Labs Unresulted Labs (From admission, onward)    Start     Ordered   09/15/19 [id number]  Basic metabolic panel  Tomorrow morning,   STAT     09/14/19 1819   09/15/19 0500  CBC  Tomorrow morning,   STAT     09/14/19 1819   09/15/19 0500  Ferritin  Tomorrow morning,   STAT     09/14/19 1840   09/15/19 0500  Iron and TIBC  Tomorrow morning,   STAT     09/14/19 1840   09/14/19 1842  Sedimentation rate  Add-on,   AD     09/14/19 1841   09/14/19 1816  Aerobic/Anaerobic Culture (surgical/deep wound)  Once,   STAT    Question:  Patient immune status  Answer:  Normal   09/14/19 1815   09/14/19 1752  Lactic acid, plasma  Now then every 2 hours,   STAT     09/14/19 1751   09/14/19 1752  Blood Culture (routine x 2)  BLOOD CULTURE X 2,   STAT     09/14/19 1751   09/14/19 1752  SARS CORONAVIRUS 2 (TAT 6-24 HRS) Nasopharyngeal Nasopharyngeal Swab  (Asymptomatic/Tier 3)  Once,   STAT    Question Answer Comment  Is this test for diagnosis or screening Screening   Symptomatic for COVID-19 as defined by CDC No   Hospitalized for COVID-19 No   Admitted to ICU for COVID-19 No   Previously tested for COVID-19 Yes   Resident in a congregate (group) care setting No  Employed in healthcare setting No   Pregnant No      09/14/19 1752          Vitals/Pain Today's Vitals    09/14/19 1447 09/14/19 1451 09/14/19 1855 09/14/19 2000  BP:  (!) 167/99 (!) 170/114 (!) 192/117  Pulse:  95 97 99  Resp:  (!) 24  (!) 24  Temp:  98.9 F (37.2 C)    TempSrc:  Oral    SpO2:  100% 100% 100%  Weight: 127 kg     Height: 5\' 6"  (1.676 m)     PainSc: 8        Isolation Precautions No active isolations  Medications Medications  metroNIDAZOLE (FLAGYL) IVPB 500 mg (500 mg Intravenous New Bag/Given 09/14/19 2010)  aspirin EC tablet 81 mg (has no administration in time range)  amLODipine (NORVASC) tablet 10 mg (has no administration in time range)  carvedilol (COREG) tablet 25 mg (25 mg Oral Given 09/14/19 2011)  cloNIDine (CATAPRES) tablet 0.1 mg (has no administration in time range)  isosorbide mononitrate (IMDUR) 24 hr tablet 30 mg (has no administration in time range)  losartan (COZAAR) tablet 100 mg (100 mg Oral Given 09/14/19 2010)  torsemide (DEMADEX) tablet 40 mg (has no administration in time range)  ferric citrate (AURYXIA) tablet 420 mg (has no administration in time range)  gabapentin (NEURONTIN) capsule 100 mg (has no administration in time range)  acetaminophen (TYLENOL) tablet 650 mg (has no administration in time range)    Or  acetaminophen (TYLENOL) suppository 650 mg (has no administration in time range)  oxyCODONE (Oxy IR/ROXICODONE) immediate release tablet 5 mg (has no administration in time range)  ondansetron (ZOFRAN) tablet 4 mg (has no administration in time range)    Or  ondansetron (ZOFRAN) injection 4 mg (has no administration in time range)  vancomycin (VANCOCIN) IVPB 1000 mg/200 mL premix (0 mg Intravenous Stopped 09/14/19 2008)  cefTRIAXone (ROCEPHIN) 2 g in sodium chloride 0.9 % 100 mL IVPB (0 g Intravenous Stopped 09/14/19 1905)    Mobility walks with device Low fall risk   Focused Assessments Cardiac Assessment Handoff:    Lab Results  Component Value Date   TROPONINI <0.03 02/16/2016   No results found for: DDIMER Does the  Patient currently have chest pain? No     R Recommendations: See Admitting Provider Note  Report given to:   Additional Notes:

## 2019-09-14 NOTE — Consult Note (Signed)
Subjective:   CC: Bilateral lower extremity wounds  HPI:  [name] is a 33 y.o. female who was consulted by [facility] for evaluation of above.  Patient previously seen by me for increased bilateral lower extremity swelling as well as skin discoloration.  This area of discoloration has progressed to full out eschars, which patient was told that it was likely calciphylaxis but has not been confirmed with a biopsy at this point.  Scheduled to see a physician regarding these wounds at [facility] next week.  The eschars have slowly been spreading with some tenderness.  She did start developing purulent drainage and a foul smelling odor several days ago around this area.  Came into the emergency department for further evaluation.    Past Medical History:  has a past medical history of Anemia of chronic disease, Asthma, CHF (congestive heart failure) ([place]), CKD (chronic kidney disease), stage III, Diabetes ([place]), Hypertension, and Morbid obesity ([place]).  Past Surgical History:  has a past surgical history that includes Tonsillectomy; DIALYSIS/PERMA CATHETER INSERTION (N/A, 02/28/2019); and pd cath.  Family History: family history includes CAD in her mother; Cancer in her maternal grandfather and maternal grandmother; Hypertension in her mother; Kidney disease in her father; Multiple sclerosis in her mother.  Social History:  reports that she has quit smoking. Her smoking use included cigars. She has never used smokeless tobacco. She reports that she does not drink alcohol or use drugs.  Current Medications: amLODipine (NORVASC) 10 MG tablet Take 10 mg by mouth daily. Loletha Grayer, MD Reordered  Ordered as: amLODipine (NORVASC) tablet 10 mg - 10 mg, Oral, Daily, First dose on Sun 09/15/19 at 1000  aspirin EC 81 MG EC tablet Take 1 tablet (81 mg total) by mouth daily. Loletha Grayer, MD Not Ordered  Ordered as: aspirin EC tablet 81 mg - 81 mg, Oral, Daily, First dose on Sun 09/15/19 at 1000  (Discontinued)  carvedilol (COREG) 25 MG tablet Take 1 tablet (25 mg total) by mouth 2 (two) times daily with a meal. Loletha Grayer, MD Reordered  Ordered as: carvedilol (COREG) tablet 25 mg - 25 mg, Oral, 2 times daily with meals, First dose on Sat 09/14/19 at 1830  cloNIDine (CATAPRES) 0.1 MG tablet Take 0.1 mg by mouth 3 (three) times daily. Loletha Grayer, MD Reordered  Ordered as: cloNIDine (CATAPRES) tablet 0.1 mg - 0.1 mg, Oral, 3 times daily, First dose on Sat 09/14/19 at 2200  ferric citrate (AURYXIA) 1 GM 210 MG(Fe) tablet Take 420 mg by mouth 3 (three) times daily. Loletha Grayer, MD Reordered  Ordered as: ferric citrate (AURYXIA) tablet 420 mg - Inpatient use is restricted to continuation from therapy as prior to admission, or to patients with documented contraindication or intolerance to calcium-based binders, sevelamer, and lanthanum.  420 mg, Oral, 3 times daily, First dose on Sat 09/14/19 at 2200  gabapentin (NEURONTIN) 100 MG capsule Take 100 mg by mouth at bedtime. Loletha Grayer, MD Reordered  Ordered as: gabapentin (NEURONTIN) capsule 100 mg - 100 mg, Oral, Daily at bedtime, First dose on Sat 09/14/19 at 2200  isosorbide mononitrate (IMDUR) 30 MG 24 hr tablet Take 1 tablet (30 mg total) by mouth 2 (two) times daily. Loletha Grayer, MD Reordered  Ordered as: isosorbide mononitrate (IMDUR) 24 hr tablet 30 mg - 30 mg, Oral, 2 times daily, First dose on Sat 09/14/19 at 2200  losartan (COZAAR) 100 MG tablet Take 100 mg by mouth every evening.  Loletha Grayer, MD Reordered  Ordered as:  losartan (COZAAR) tablet 100 mg - 100 mg, Oral, Every evening, First dose on Sat 09/14/19 at 1830  torsemide (DEMADEX) 20 MG tablet Take 40 mg by mouth daily.  Loletha Grayer, MD Reordered  Ordered as: torsemide [facility]) tablet 40 mg - 40 mg, Oral, Daily, First dose on Sun 09/15/19 at 1000     Allergies:  No Known Allergies  ROS:  General: Denies weight loss, weight gain, fatigue,  fevers, chills, and night sweats. Eyes: Denies blurry vision, double vision, eye pain, itchy eyes, and tearing. Ears: Denies hearing loss, earache, and ringing in ears. Nose: Denies sinus pain, congestion, infections, runny nose, and nosebleeds. Mouth/throat: Denies hoarseness, sore throat, bleeding gums, and difficulty swallowing. Heart: Denies chest pain, palpitations, racing heart, irregular heartbeat, leg pain or swelling, and decreased activity tolerance. Respiratory: Denies breathing difficulty, shortness of breath, wheezing, cough, and sputum. GI: Denies change in appetite, heartburn, nausea, vomiting, constipation, diarrhea, and blood in stool. GU: Denies difficulty urinating, pain with urinating, urgency, frequency, blood in urine. Musculoskeletal: Denies joint stiffness, pain, swelling, muscle weakness. Skin: Denies rash, itching, mass, tumors, sores, and boils Neurologic: Denies headache, fainting, dizziness, seizures, numbness, and tingling. Psychiatric: Denies depression, anxiety, difficulty sleeping, and memory loss. Endocrine: Denies heat or cold intolerance, and increased thirst or urination. Blood/lymph: Denies easy bruising, easy bruising, and swollen glands     Objective:     BP (!) 192/117   Pulse 99   Temp 98.9 F (37.2 C) (Oral)   Resp (!) 24   Ht 5\' 6"  (1.676 m)   Wt 127 kg   LMP 09/04/2019 (Exact Date)   SpO2 100%   BMI 45.19 kg/m   Constitutional :  alert, cooperative, appears stated age and no distress  Lymphatics/Throat:  no asymmetry, masses, or scars  Respiratory:  clear to auscultation bilaterally  Cardiovascular:  regular rate and rhythm  Gastrointestinal: soft, non-tender; bowel sounds normal; no masses,  no organomegaly.  Musculoskeletal: Steady gait and movement  Skin: Cool and moist.  Image of wound as noted below.  Top picture is right lower leg.  Bottom image is left lower leg.  Areas of purulent drainage noted from edges of eschar with foul  smell.  Psychiatric: Normal affect, non-agitated, not confused            LABS:  CMP Latest Ref Rng & Units 09/14/2019 07/10/2019 07/08/2019  Glucose 70 - 99 mg/dL 124(H) 101(H) 99  BUN 6 - 20 mg/dL 57(H) 39(H) 75(H)  Creatinine 0.44 - 1.00 mg/dL 8.91(H) 7.41(H) 12.41(H)  Sodium 135 - 145 mmol/L 138 141 140  Potassium 3.5 - 5.1 mmol/L 3.4(L) 4.6 5.5(H)  Chloride 98 - 111 mmol/L 103 96(L) 88(L)  CO2 22 - 32 mmol/L 21(L) 25 18(L)  Calcium 8.9 - 10.3 mg/dL 8.0(L) 9.5 9.0  Total Protein 6.5 - 8.1 g/dL 7.4 - -  Total Bilirubin 0.3 - 1.2 mg/dL 0.8 - -  Alkaline Phos 38 - 126 U/L 81 - -  AST 15 - 41 U/L 14(L) - -  ALT 0 - 44 U/L 16 - -   CBC Latest Ref Rng & Units 09/14/2019 07/10/2019 07/08/2019  WBC 4.0 - 10.5 K/uL 15.1(H) 23.2(H) 25.1(H)  Hemoglobin 12.0 - 15.0 g/dL 8.4(L) 7.4(L) 7.8(L)  Hematocrit 36.0 - 46.0 % 25.5(L) 23.7(L) 24.6(L)  Platelets 150 - 400 K/uL 288 401(H) 472(H)    RADS: n/a  Assessment:      Bilateral lower extremity wounds  Plan:    Infected bilateral lower extremity  wounds with eschar floating in purulent material.  We will proceed with local debridement and wound VAC placement, as well as formal biopsy to confirm calciphylaxis diagnosis.  Alternatives include continued observation and IV antibiotics although this will likely not heal the wound itself..  Benefits include possible symptom relief, pathologic evaluation. Discussed the risk of surgery including recurrence, chronic pain, post-op infxn, poor cosmesis, poor/delayed wound healing, and possible re-operation to address said risks. The risks of general anesthetic, if used, includes MI, CVA, sudden death or even reaction to anesthetic medications also discussed.   The patient verbalized understanding and all questions were answered to the patient's satisfaction.  I explained to her that due to the degree of the wound, she will likely need complex and chronic wound care at a specialized center, with  potential for additional surgeries to facilitate wound healing.  I explained to her that my role currently has a general surgeon will be to deal with the acute infectious episode and to start the wound care process.  All other medical issues per hospitalist service.

## 2019-09-14 NOTE — ED Notes (Signed)
Blood cultures collected prior to antibiotics administered

## 2019-09-14 NOTE — ED Provider Notes (Signed)
[facility] Emergency Department Provider Note  Time seen: 5:38 PM  I have reviewed the triage vital signs and the nursing notes.   HISTORY  Chief Complaint Shortness of Breath and Leg Pain (bilateral)   HPI [name] is a 33 y.o. female with a past medical history of ESRD on HD, last dialysis was this morning, presents emergency department for lower extremity wound infections.  According to the patient she was recently diagnosed with calciphylaxis, has developed ulcerations with eschar areas to her bilateral lower extremities.  States over the past 2 days or so her left lower extremity wound has been draining significant discharge and has developed a very foul odor.  Patient denies any fever.  Denies any cough or shortness of breath contrary to triage note.   Past Medical History:  Diagnosis Date  . Anemia of chronic disease   . Asthma   . CHF (congestive heart failure) ([place])   . CKD (chronic kidney disease), stage III   . Diabetes ([place])    type 2  . Hypertension   . Morbid obesity [facility])     Patient Active Problem List   Diagnosis Date Noted  . Sepsis due to cellulitis ([place]) 07/02/2019  . HTN (hypertension) 03/11/2016  . Renal insufficiency 03/11/2016  . Menorrhagia   . Nephrotic syndrome   . Anemia in chronic kidney disease   . Iron deficiency anemia 02/12/2016  . CHF (congestive heart failure) ([place]) 02/11/2016    Past Surgical History:  Procedure Laterality Date  . DIALYSIS/PERMA CATHETER INSERTION N/A 02/28/2019   Procedure: DIALYSIS/PERMA CATHETER INSERTION;  Surgeon: Algernon Huxley, MD;  Location: [place] [facility];  Service: Cardiovascular;  Laterality: N/A;  . TONSILLECTOMY      Prior to Admission medications   Medication Sig Start Date End Date Taking? Authorizing Provider  amLODipine (NORVASC) 10 MG tablet Take 10 mg by mouth daily.    [provider]  aspirin EC 81 MG EC tablet Take 1 tablet (81 mg total) by mouth  daily. 02/16/16   Demetrios Loll, MD  carvedilol (COREG) 25 MG tablet Take 1 tablet (25 mg total) by mouth 2 (two) times daily with a meal. 02/20/18   Alisa Graff, FNP  cloNIDine (CATAPRES) 0.1 MG tablet Take 0.1 mg by mouth 3 (three) times daily.    [provider]  hydrOXYzine (ATARAX/VISTARIL) 25 MG tablet Take 25 mg by mouth 2 (two) times daily as needed for itching. 05/09/19   [provider]  isosorbide mononitrate (IMDUR) 30 MG 24 hr tablet Take 1 tablet (30 mg total) by mouth 2 (two) times daily. 02/20/18   Alisa Graff, FNP  losartan (COZAAR) 100 MG tablet Take 100 mg by mouth every evening.     [provider]  oxyCODONE-acetaminophen (PERCOCET) 5-325 MG tablet Take 1 tablet by mouth every 6 (six) hours as needed for moderate pain or severe pain. 07/10/19 07/09/20  Saundra Shelling, MD  torsemide (DEMADEX) 20 MG tablet Take 40 mg by mouth daily.     [provider]    No Known Allergies  Family History  Problem Relation Age of Onset  . Hypertension Mother   . Multiple sclerosis Mother   . Cancer Maternal Grandmother        Ovarian   . Cancer Maternal Grandfather        Prostate     Social History Social History   Tobacco Use  . Smoking status: Current Some Day Smoker  Types: Cigars  . Smokeless tobacco: Never Used  Substance Use Topics  . Alcohol use: No    Alcohol/week: 0.0 standard drinks  . Drug use: No    Review of Systems Constitutional: Negative for fever. Cardiovascular: Negative for chest pain. Respiratory: Negative for shortness of breath. Gastrointestinal: Negative for abdominal pain Musculoskeletal: Negative for musculoskeletal complaints Neurological: Negative for headache All other ROS negative  ____________________________________________   PHYSICAL EXAM:  VITAL SIGNS: ED Triage Vitals  Enc Vitals Group     BP 09/14/19 1451 (!) 167/99     Pulse Rate 09/14/19 1451 95     Resp 09/14/19 1451 (!) 24     Temp  09/14/19 1451 98.9 F (37.2 C)     Temp Source 09/14/19 1451 Oral     SpO2 09/14/19 1451 100 %     Weight 09/14/19 1447 280 lb (127 kg)     Height 09/14/19 1447 5\' 6"  (1.676 m)     Head Circumference --      Peak Flow --      Pain Score 09/14/19 1447 8     Pain Loc --      Pain Edu? --      Excl. in [place]? --    Constitutional: Alert and oriented. Well appearing and in no distress. Eyes: Normal exam ENT      Head: Normocephalic and atraumatic.      Mouth/Throat: Mucous membranes are moist. Cardiovascular: Normal rate, regular rhythm. Respiratory: Normal respiratory effort without tachypnea nor retractions. Breath sounds are clear  Gastrointestinal: Soft and nontender. No distention.  Musculoskeletal: Large area of eschar with surrounding ulceration to right and left lower extremity.  Left lower extremity approximately 10 x 10 cm area involvement with drainage and foul odor.  Similarly sized lesion to the right lower extremity with no significant discharge. Neurologic:  Normal speech and language. No gross focal neurologic deficits  Skin:  Skin is warm, dry and intact.  Psychiatric: Mood and affect are normal.  ____________________________________________    EKG  EKG viewed and interpreted by myself shows a sinus rhythm at 96 bpm with a narrow QRS, normal axis, normal intervals besides slight QTC prolongation, nonspecific ST changes but no ST elevation.  ____________________________________________    INITIAL IMPRESSION / ASSESSMENT AND PLAN / ED COURSE  Pertinent labs & imaging results that were available during my care of the patient were reviewed by me and considered in my medical decision making (see chart for details).   Patient presents to the emergency department for worsening wound infection to her left lower extremity.  Patient has a history of calciphylaxis which per my review has a very significant mortality rate associated with wound infection and sepsis.  Patient has  a white blood cell count of 15,000 currently with a respiratory rate of 24 technically meeting sepsis criteria already.  I spoke with general surgery who is comfortable consulting on the patient here for local wound debridement.  I spoke with nephrology who is also comfortable admitting the patient locally.  I spoke to the hospitalist who is also comfortable admitting the patient locally for IV antibiotics with surgical consultation for wound care and debridement.  We will start the patient on broad-spectrum antibiotics, check cultures and admit the patient to the hospitalist service for further treatment.  AMINAT MIGDAL was evaluated in Emergency Department on 09/14/2019 for the symptoms described in the history of present illness. She was evaluated in the context of the global COVID-19 pandemic, which  necessitated consideration that the patient might be at risk for infection with the SARS-CoV-2 virus that causes COVID-19. Institutional protocols and algorithms that pertain to the evaluation of patients at risk for COVID-19 are in a state of rapid change based on information released by regulatory bodies including the CDC and federal and state organizations. These policies and algorithms were followed during the patient's care in the ED.  CRITICAL CARE Performed by: Harvest Dark   Total critical care time: 30 minutes  Critical care time was exclusive of separately billable procedures and treating other patients.  Critical care was necessary to treat or prevent imminent or life-threatening deterioration.  Critical care was time spent personally by me on the following activities: development of treatment plan with patient and/or surrogate as well as nursing, discussions with consultants, evaluation of patient's response to treatment, examination of patient, obtaining history from patient or surrogate, ordering and performing treatments and interventions, ordering and review of laboratory  studies, ordering and review of radiographic studies, pulse oximetry and re-evaluation of patient's condition.  ____________________________________________   FINAL CLINICAL IMPRESSION(S) / ED DIAGNOSES  Skin ulceration Sepsis Calciphylaxis   Harvest Dark, MD 09/14/19 1751

## 2019-09-14 NOTE — Consult Note (Signed)
Pharmacy Antibiotic Note  [name] is a 33 y.o. female admitted on 09/14/2019 with cellulitis.  Pharmacy has been consulted for Vancomycin and Cefepime dosing.  Plan: 1) Patient received Vancomycin 1g IV x 1 in the ED. Will order additional 1g dose to equal 2g loading dose for patient. Will order Vancomycin 1g IV with each Dialysis session(currently scheduled as Tuesdays, Thursdays, and Saturdays)  2) Cefepime 1g Q24 hours, as patient is receiving dialysis.  Height: 5\' 6"  (167.6 cm) Weight: 280 lb (127 kg) IBW/kg (Calculated) : 59.3  Temp (24hrs), Avg:98.9 F (37.2 C), Min:98.9 F (37.2 C), Max:98.9 F (37.2 C)  Recent Labs  Lab 09/14/19 1459  WBC 15.1*  CREATININE 8.91*    Estimated Creatinine Clearance: 12.1 mL/min (A) (by C-G formula based on SCr of 8.91 mg/dL (H)).    No Known Allergies  Antimicrobials this admission: Vancomycin 11/28 >>  Cefepime 11/28 >>  Rocephin 11/28 x 1    Microbiology results: 11/28 BCx: pending 11/28 Wound cx: pending   Thank you for allowing pharmacy to be a part of this patient's care.  [name] 09/14/2019 6:57 PM

## 2019-09-15 ENCOUNTER — Encounter: Payer: Self-pay | Admitting: Anesthesiology

## 2019-09-15 ENCOUNTER — Inpatient Hospital Stay: Payer: Medicare Other | Admitting: Anesthesiology

## 2019-09-15 ENCOUNTER — Encounter
Admission: EM | Disposition: A | Discharge status: Home Health | Payer: Self-pay | Source: Home / Self Care | Attending: Family Medicine

## 2019-09-15 DIAGNOSIS — L03115 Cellulitis of right lower limb: Secondary | ICD-10-CM

## 2019-09-15 HISTORY — PX: WOUND DEBRIDEMENT: SHX247

## 2019-09-15 HISTORY — PX: APPLICATION OF WOUND VAC: SHX5189

## 2019-09-15 LAB — CBC
HCT: 22.6 % — ABNORMAL LOW (ref 36.0–46.0)
HCT: 24 % — ABNORMAL LOW (ref 36.0–46.0)
Hemoglobin: 7 g/dL — ABNORMAL LOW (ref 12.0–15.0)
Hemoglobin: 7.6 g/dL — ABNORMAL LOW (ref 12.0–15.0)
MCH: 29.3 pg (ref 26.0–34.0)
MCH: 29.6 pg (ref 26.0–34.0)
MCHC: 31 g/dL (ref 30.0–36.0)
MCHC: 31.7 g/dL (ref 30.0–36.0)
MCV: 94.6 fL (ref 80.0–100.0)
MCV: 93.4 fL (ref 80.0–100.0)
Platelets: 261 10*3/uL (ref 150–400)
Platelets: 289 10*3/uL (ref 150–400)
RBC: 2.39 MIL/uL — ABNORMAL LOW (ref 3.87–5.11)
RBC: 2.57 MIL/uL — ABNORMAL LOW (ref 3.87–5.11)
RDW: 16.3 % — ABNORMAL HIGH (ref 11.5–15.5)
RDW: 16.4 % — ABNORMAL HIGH (ref 11.5–15.5)
WBC: 15 10*3/uL — ABNORMAL HIGH (ref 4.0–10.5)
WBC: 14.9 10*3/uL — ABNORMAL HIGH (ref 4.0–10.5)
nRBC: 0 % (ref 0.0–0.2)
nRBC: 0 % (ref 0.0–0.2)

## 2019-09-15 LAB — SARS CORONAVIRUS 2 (TAT 6-24 HRS): SARS Coronavirus 2: NEGATIVE

## 2019-09-15 LAB — FERRITIN: Ferritin: 272 ng/mL (ref 11–307)

## 2019-09-15 LAB — BASIC METABOLIC PANEL
Anion gap: 15 (ref 5–15)
BUN: 64 mg/dL — ABNORMAL HIGH (ref 6–20)
CO2: 19 mmol/L — ABNORMAL LOW (ref 22–32)
Calcium: 8.1 mg/dL — ABNORMAL LOW (ref 8.9–10.3)
Chloride: 106 mmol/L (ref 98–111)
Creatinine, Ser: 9.58 mg/dL — ABNORMAL HIGH (ref 0.44–1.00)
GFR calc Af Amer: 6 mL/min — ABNORMAL LOW (ref >60)
GFR calc non Af Amer: 5 mL/min — ABNORMAL LOW (ref >60)
Glucose, Bld: 97 mg/dL (ref 70–99)
Potassium: 4 mmol/L (ref 3.5–5.1)
Sodium: 140 mmol/L (ref 135–145)

## 2019-09-15 LAB — IRON AND TIBC
Iron: 43 ug/dL (ref 28–170)
Saturation Ratios: 19 % (ref 10.4–31.8)
TIBC: 224 ug/dL — ABNORMAL LOW (ref 250–450)
UIBC: 181 ug/dL

## 2019-09-15 LAB — CREATININE, SERUM
Creatinine, Ser: 10.26 mg/dL — ABNORMAL HIGH (ref 0.44–1.00)
GFR calc Af Amer: 5 mL/min — ABNORMAL LOW (ref >60)
GFR calc non Af Amer: 4 mL/min — ABNORMAL LOW (ref >60)

## 2019-09-15 LAB — GLUCOSE, CAPILLARY: Glucose-Capillary: 90 mg/dL (ref 70–99)

## 2019-09-15 SURGERY — DEBRIDEMENT, WOUND
Anesthesia: General | Laterality: Bilateral

## 2019-09-15 MED ORDER — VASOPRESSIN 20 UNIT/ML IV SOLN
INTRAVENOUS | Status: DC | PRN
  Administered 2019-09-15: 2 [IU] via INTRAVENOUS
  Administered 2019-09-15: 4 [IU] via INTRAVENOUS
  Administered 2019-09-15: 3 [IU] via INTRAVENOUS

## 2019-09-15 MED ORDER — CHLORHEXIDINE GLUCONATE CLOTH 2 % EX PADS
6.0000 | MEDICATED_PAD | Freq: Every day | CUTANEOUS | Status: DC
  Administered 2019-09-16 – 2019-09-17 (×2): 6 via TOPICAL

## 2019-09-15 MED ORDER — LIDOCAINE HCL (CARDIAC) PF 100 MG/5ML IV SOSY
PREFILLED_SYRINGE | INTRAVENOUS | Status: DC | PRN
  Administered 2019-09-15: 100 mg via INTRAVENOUS

## 2019-09-15 MED ORDER — ONDANSETRON HCL 4 MG/2ML IJ SOLN: 4.0000 mg | Freq: Once | INTRAMUSCULAR | Status: DC | PRN

## 2019-09-15 MED ORDER — HEPARIN SODIUM (PORCINE) 5000 UNIT/ML IJ SOLN
5000.0000 [IU] | Freq: Three times a day (TID) | INTRAMUSCULAR | Status: DC
  Administered 2019-09-15 – 2019-09-17 (×5): 5000 [IU] via SUBCUTANEOUS
  Filled 2019-09-15 (×5): qty 1

## 2019-09-15 MED ORDER — FENTANYL CITRATE (PF) 100 MCG/2ML IJ SOLN
25.0000 ug | INTRAMUSCULAR | Status: DC | PRN
  Administered 2019-09-15 (×4): 25 ug via INTRAVENOUS

## 2019-09-15 MED ORDER — FENTANYL CITRATE (PF) 100 MCG/2ML IJ SOLN
INTRAMUSCULAR | Status: DC | PRN
  Administered 2019-09-15 (×2): 25 ug via INTRAVENOUS
  Administered 2019-09-15: 50 ug via INTRAVENOUS
  Administered 2019-09-15 (×2): 25 ug via INTRAVENOUS
  Administered 2019-09-15: 50 ug via INTRAVENOUS

## 2019-09-15 MED ORDER — LACTATED RINGERS IV SOLN
INTRAVENOUS | Status: DC
  Administered 2019-09-15: 08:00:00 via INTRAVENOUS

## 2019-09-15 MED ORDER — ONDANSETRON HCL 4 MG/2ML IJ SOLN
INTRAMUSCULAR | Status: AC
  Filled 2019-09-15: qty 2

## 2019-09-15 MED ORDER — SODIUM CHLORIDE 0.9 % IV SOLN
INTRAVENOUS | Status: DC | PRN
  Administered 2019-09-15 (×2): via INTRAVENOUS

## 2019-09-15 MED ORDER — PROPOFOL 500 MG/50ML IV EMUL
INTRAVENOUS | Status: AC
  Filled 2019-09-15: qty 50

## 2019-09-15 MED ORDER — FENTANYL CITRATE (PF) 100 MCG/2ML IJ SOLN
INTRAMUSCULAR | Status: AC
  Administered 2019-09-15: 25 ug via INTRAVENOUS
  Filled 2019-09-15: qty 2

## 2019-09-15 MED ORDER — DEXMEDETOMIDINE HCL 200 MCG/2ML IV SOLN
INTRAVENOUS | Status: DC | PRN
  Administered 2019-09-15: 8 ug via INTRAVENOUS
  Administered 2019-09-15: 12 ug via INTRAVENOUS

## 2019-09-15 MED ORDER — SODIUM CHLORIDE 0.9 % IV SOLN
INTRAVENOUS | Status: DC | PRN
  Administered 2019-09-15 – 2019-09-17 (×3): 250 mL via INTRAVENOUS

## 2019-09-15 MED ORDER — ONDANSETRON HCL 4 MG/2ML IJ SOLN
INTRAMUSCULAR | Status: DC | PRN
  Administered 2019-09-15: 4 mg via INTRAVENOUS

## 2019-09-15 MED ORDER — SUCCINYLCHOLINE CHLORIDE 20 MG/ML IJ SOLN
INTRAMUSCULAR | Status: DC | PRN
  Administered 2019-09-15: 100 mg via INTRAVENOUS

## 2019-09-15 MED ORDER — VASOPRESSIN 20 UNIT/ML IV SOLN
INTRAVENOUS | Status: AC
  Filled 2019-09-15: qty 1

## 2019-09-15 MED ORDER — FENTANYL CITRATE (PF) 100 MCG/2ML IJ SOLN
INTRAMUSCULAR | Status: AC
  Filled 2019-09-15: qty 2

## 2019-09-15 MED ORDER — PHENYLEPHRINE HCL (PRESSORS) 10 MG/ML IV SOLN
INTRAVENOUS | Status: DC | PRN
  Administered 2019-09-15 (×2): 200 ug via INTRAVENOUS

## 2019-09-15 MED ORDER — PROPOFOL 10 MG/ML IV BOLUS
INTRAVENOUS | Status: DC | PRN
  Administered 2019-09-15: 50 mg via INTRAVENOUS
  Administered 2019-09-15: 200 mg via INTRAVENOUS

## 2019-09-15 MED ORDER — MIDAZOLAM HCL 2 MG/2ML IJ SOLN
INTRAMUSCULAR | Status: DC | PRN
  Administered 2019-09-15: 2 mg via INTRAVENOUS

## 2019-09-15 MED ORDER — PHENYLEPHRINE HCL (PRESSORS) 10 MG/ML IV SOLN
INTRAVENOUS | Status: AC
  Filled 2019-09-15: qty 1

## 2019-09-15 MED ORDER — MIDAZOLAM HCL 2 MG/2ML IJ SOLN
INTRAMUSCULAR | Status: AC
  Filled 2019-09-15: qty 2

## 2019-09-15 SURGICAL SUPPLY — 37 items
BNDG COHESIVE 6X5 TAN STRL LF (GAUZE/BANDAGES/DRESSINGS) ×3 IMPLANT
BNDG CONFORM 2 STRL LF (GAUZE/BANDAGES/DRESSINGS) IMPLANT
CANISTER SUCT 1200ML W/VALVE (MISCELLANEOUS) IMPLANT
CANISTER SUCT 3000ML PPV (MISCELLANEOUS) ×3 IMPLANT
CANISTER WOUND CARE 500ML ATS (WOUND CARE) ×3 IMPLANT
CHLORAPREP W/TINT 26 (MISCELLANEOUS) ×3 IMPLANT
CONNECTOR Y TYPE (MISCELLANEOUS) ×3 IMPLANT
COVER WAND RF STERILE (DRAPES) ×3 IMPLANT
DRAPE LAPAROTOMY 100X77 ABD (DRAPES) ×3 IMPLANT
DRSG EMULSION OIL 3X3 NADH (GAUZE/BANDAGES/DRESSINGS) IMPLANT
DRSG VAC ATS LRG SENSATRAC (GAUZE/BANDAGES/DRESSINGS) ×6 IMPLANT
DRSG VAC ATS MED SENSATRAC (GAUZE/BANDAGES/DRESSINGS) IMPLANT
ELECT REM PT RETURN 9FT ADLT (ELECTROSURGICAL) ×3
ELECTRODE REM PT RTRN 9FT ADLT (ELECTROSURGICAL) ×1 IMPLANT
GAUZE SPONGE 4X4 12PLY STRL (GAUZE/BANDAGES/DRESSINGS) IMPLANT
GLOVE BIOGEL PI IND STRL 6.5 (GLOVE) ×1 IMPLANT
GLOVE BIOGEL PI IND STRL 7.0 (GLOVE) ×3 IMPLANT
GLOVE BIOGEL PI INDICATOR 6.5 (GLOVE) ×2
GLOVE BIOGEL PI INDICATOR 7.0 (GLOVE) ×6
GLOVE SURG SYN 6.5 ES PF (GLOVE) ×3 IMPLANT
GOWN STRL REUS W/ TWL LRG LVL3 (GOWN DISPOSABLE) ×2 IMPLANT
GOWN STRL REUS W/ TWL XL LVL3 (GOWN DISPOSABLE) ×1 IMPLANT
GOWN STRL REUS W/TWL LRG LVL3 (GOWN DISPOSABLE) ×4
GOWN STRL REUS W/TWL XL LVL3 (GOWN DISPOSABLE) ×2
HANDLE YANKAUER SUCT BULB TIP (MISCELLANEOUS) ×3 IMPLANT
KIT TURNOVER KIT A (KITS) ×3 IMPLANT
LABEL OR SOLS (LABEL) ×3 IMPLANT
NS IRRIG 500ML POUR BTL (IV SOLUTION) ×3 IMPLANT
PACK BASIN MINOR ARMC (MISCELLANEOUS) ×3 IMPLANT
PACK EXTREMITY ARMC (MISCELLANEOUS) ×3 IMPLANT
PAD PREP 24X41 OB/GYN DISP (PERSONAL CARE ITEMS) ×6 IMPLANT
PENCIL SMOKE EVACUATOR (MISCELLANEOUS) ×3 IMPLANT
SOL PREP PVP 2OZ (MISCELLANEOUS) ×3
SOLUTION PREP PVP 2OZ (MISCELLANEOUS) ×1 IMPLANT
SPONGE LAP 18X18 RF (DISPOSABLE) ×6 IMPLANT
STOCKINETTE IMPERV 14X48 (MISCELLANEOUS) ×3 IMPLANT
SUT SILK 3-0 (SUTURE) ×6 IMPLANT

## 2019-09-15 NOTE — Anesthesia Postprocedure Evaluation (Signed)
Anesthesia Post Note  Patient: [name]  Procedure(s) Performed: DEBRIDEMENT WOUND (Bilateral ) APPLICATION OF WOUND VAC (Bilateral )  Patient location during evaluation: PACU Anesthesia Type: General Level of consciousness: awake and alert Pain management: pain level controlled Vital Signs Assessment: post-procedure vital signs reviewed and stable Respiratory status: spontaneous breathing, nonlabored ventilation, respiratory function stable and patient connected to nasal cannula oxygen Cardiovascular status: blood pressure returned to baseline and stable Postop Assessment: no apparent nausea or vomiting Anesthetic complications: no     Last Vitals:  Vitals:   09/15/19 1120 09/15/19 1205  BP: (!) 139/94 134/90  Pulse: 85 84  Resp: 16 19  Temp:  36.6 C  SpO2: 97% 98%    Last Pain:  Vitals:   09/15/19 1205  TempSrc: Oral  PainSc:                  Kolbi Altadonna S

## 2019-09-15 NOTE — Progress Notes (Signed)
Notified Dr. Sarajane Jews patient wanted to advance diet. Orders placed.

## 2019-09-15 NOTE — Anesthesia Procedure Notes (Signed)
Procedure Name: Intubation Date/Time: 09/15/2019 8:36 AM Performed by: Clinton Sawyer, CRNA Pre-anesthesia Checklist: Patient identified, Emergency Drugs available, Suction available, Patient being monitored and Timeout performed Patient Re-evaluated:Patient Re-evaluated prior to induction Oxygen Delivery Method: Circle system utilized Preoxygenation: Pre-oxygenation with 100% oxygen Induction Type: Rapid sequence and Cricoid Pressure applied Laryngoscope Size: Mac and 4 Grade View: Grade I Tube type: Oral Tube size: 7.0 mm Number of attempts: 1 Airway Equipment and Method: Stylet Placement Confirmation: ETT inserted through vocal cords under direct vision,  positive ETCO2 and breath sounds checked- equal and bilateral Secured at: 23 cm Tube secured with: Tape Dental Injury: Teeth and Oropharynx as per pre-operative assessment

## 2019-09-15 NOTE — Progress Notes (Signed)
Subjective:  CC: [name] is a 33 y.o. female  Hospital stay day 1, Day of Surgery BLE wounds  HPI: No changes since admission.  ROS:  General: Denies weight loss, weight gain, fatigue, fevers, chills, and night sweats. Heart: Denies chest pain, palpitations, racing heart, irregular heartbeat, leg pain or swelling, and decreased activity tolerance. Respiratory: Denies breathing difficulty, shortness of breath, wheezing, cough, and sputum. GI: Denies change in appetite, heartburn, nausea, vomiting, constipation, diarrhea, and blood in stool. GU: Denies difficulty urinating, pain with urinating, urgency, frequency, blood in urine.   Objective:   Temp:  [97.8 F (36.6 C)-98.9 F (37.2 C)] 97.8 F (36.6 C) (11/29 0553) Pulse Rate:  [91-99] 91 (11/29 0553) Resp:  [20-24] 21 (11/29 0553) BP: (115-192)/(83-117) 151/91 (11/29 0800) SpO2:  [94 %-100 %] 94 % (11/29 0553) Weight:  [127 kg-131.4 kg] 131.4 kg (11/28 2151)     Height: 5\' 6"  (167.6 cm) Weight: 131.4 kg BMI (Calculated): 46.78   Intake/Output this shift:   Intake/Output Summary (Last 24 hours) at 09/15/2019 0820 Last data filed at 09/15/2019 0600 Gross per 24 hour  Intake 399.96 ml  Output 300 ml  Net 99.96 ml    Constitutional :  alert, cooperative, appears stated age and no distress  Respiratory:  clear to auscultation bilaterally  Cardiovascular:  regular rate and rhythm  Gastrointestinal: soft, non-tender; bowel sounds normal; no masses,  no organomegaly.   Skin: Cool and moist. No change in wound since admission  Psychiatric: Normal affect, non-agitated, not confused       LABS:  CMP Latest Ref Rng & Units 09/15/2019 09/14/2019 07/10/2019  Glucose 70 - 99 mg/dL 97 124(H) 101(H)  BUN 6 - 20 mg/dL 64(H) 57(H) 39(H)  Creatinine 0.44 - 1.00 mg/dL 9.58(H) 8.91(H) 7.41(H)  Sodium 135 - 145 mmol/L 140 138 141  Potassium 3.5 - 5.1 mmol/L 4.0 3.4(L) 4.6  Chloride 98 - 111 mmol/L 106 103 96(L)  CO2 22 - 32  mmol/L 19(L) 21(L) 25  Calcium 8.9 - 10.3 mg/dL 8.1(L) 8.0(L) 9.5  Total Protein 6.5 - 8.1 g/dL - 7.4 -  Total Bilirubin 0.3 - 1.2 mg/dL - 0.8 -  Alkaline Phos 38 - 126 U/L - 81 -  AST 15 - 41 U/L - 14(L) -  ALT 0 - 44 U/L - 16 -   CBC Latest Ref Rng & Units 09/15/2019 09/14/2019 07/10/2019  WBC 4.0 - 10.5 K/uL 15.0(H) 15.1(H) 23.2(H)  Hemoglobin 12.0 - 15.0 g/dL 7.0(L) 8.4(L) 7.4(L)  Hematocrit 36.0 - 46.0 % 22.6(L) 25.5(L) 23.7(L)  Platelets 150 - 400 K/uL 261 288 401(H)    RADS: n/a Assessment:   BLE wounds, possible calciphylaxis.  To OR today for debridement, wound vac placement

## 2019-09-15 NOTE — Progress Notes (Signed)
Gave morning dose of carvedilol, notified Dr. Sarajane Jews and OR nurse

## 2019-09-15 NOTE — Anesthesia Post-op Follow-up Note (Signed)
Anesthesia QCDR form completed.        

## 2019-09-15 NOTE — Op Note (Signed)
Preoperative diagnosis: Bilateral lower extremity wounds Postoperative diagnosis: same  Procedure: Debridement of bilateral lower extremity wounds and wound VAC placement  Anesthesia: GETA  Surgeon: Lysle Pearl  Wound Classification: Contaminated  Indications: Patient is a 33 y.o. female  presented with above.  See H&P for further details.  Specimen: Left lower extremity wound and left lower extremity wound eschar  Complications: None  Estimated Blood Loss: 130 mL  Findings:  1.  Eschar with pockets of abscess deeper to it in bilateral lower extremities 2. purulent secretions drained  3. Adequate hemostasis.   Description of procedure: The patient was placed in the frog-leg position and general anesthesia was induced. The area was prepped and draped in the usual sterile fashion. A timeout was completed verifying correct patient, procedure, site, positioning, and implant(s) and/or special equipment prior to beginning this procedure.   Attention was turned to the left lower leg where the clearly visible eschar was noted.  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  Eschar was then slowly removed from the wound base using a combination of sharp dissection with 15 blade and electrocautery.  There were areas of high pressure venous backflow which were controlled with combination of manual pressure and suture ligation.  Once the eschar was completely removed off the wound bed down to the subcutaneous fat, it was removed off operative field pending pathology.  Total size of the eschar measured 15 cm at its longest area by 11 cm at its widest area.  Inspection of the wound base noted pockets of purulent fluid which were all drained adequately.  Hemostasis was confirmed as well at this point.  Wound itself measured 15 cm x 11 cm x 3 cm deep at its deepest location.    Attention was then turned to the right lower leg where there are 2 islands  of similar looking wounds to the left lower leg.Eschar was then slowly removed from the wound base using a combination of sharp dissection with 15 blade and electrocautery.  There were areas of high pressure venous backflow which were controlled with combination of manual pressure and suture ligation. Total size of the eschar measured 7cm at its longest area by 5cm at its widest area for the more cephalad wound, and 7 cm at its longest area by 5 cm at its widest area for the caudad wound.  Inspection of the wound base noted pockets of purulent fluid which were all drained adequately.  Hemostasis was confirmed as well at this point.  Cephalad wound itself measured 7 cm x 11 cm x 0.5 cm deep at its deepest location, caudad wound measured 7 cm x 5 cm x 1 cm deep at its deepest location.  A wound VAC was then placed over all 3 wounds by cutting out a black sponge in the shape of the wounds and then placing it under negative pressure.  The 2 wounds on the right lower leg were connected via a black sponge bleach that was placed over a layer of Tegaderm that secured the initial sponges in place.  Adequate seal with no leakage was noted at the end of the procedure.  Patient was then extubated and transferred to the PACU in stable condition.  Sponge and instrument count correct at end the procedure.

## 2019-09-15 NOTE — Anesthesia Preprocedure Evaluation (Addendum)
Anesthesia Evaluation  Patient identified by MRN, date of birth, ID band Patient awake    Reviewed: Allergy & Precautions, NPO status , Patient's Chart, lab work & pertinent test results, reviewed documented beta blocker date and time   Airway Mallampati: III  TM Distance: >3 FB     Dental  (+) Chipped   Pulmonary asthma , former smoker,           Cardiovascular hypertension, Pt. on medications and Pt. on home beta blockers +CHF  + dysrhythmias      Neuro/Psych    GI/Hepatic   Endo/Other  diabetes, Type 2Morbid obesity  Renal/GU ESRF and DialysisRenal disease     Musculoskeletal   Abdominal   Peds  Hematology  (+) Blood dyscrasia, anemia ,   Anesthesia Other Findings Hx of PACs. Last echo 56yr 50-55. K 3.4. Haed dialysis yesterday.  Reproductive/Obstetrics                           Anesthesia Physical Anesthesia Plan  ASA: III  Anesthesia Plan: General   Post-op Pain Management:    Induction: Intravenous  PONV Risk Score and Plan:   Airway Management Planned: Oral ETT  Additional Equipment:   Intra-op Plan:   Post-operative Plan:   Informed Consent: I have reviewed the patients History and Physical, chart, labs and discussed the procedure including the risks, benefits and alternatives for the proposed anesthesia with the patient or authorized representative who has indicated his/her understanding and acceptance.       Plan Discussed with: CRNA  Anesthesia Plan Comments: (33 y.o. female with a past medical history of ESRD on HD, last dialysis was this morning, presents emergency department for lower extremity wound infections.  According to the patient she was recently diagnosed with calciphylaxis, has developed ulcerations with eschar areas to her bilateral lower extremities.  States over the past 2 days or so her left lower extremity wound has been draining significant  discharge and has developed a very foul odor.  Patient denies any fever.  Denies any cough or shortness of breath contrary to triage note. )       Anesthesia Quick Evaluation

## 2019-09-15 NOTE — Transfer of Care (Signed)
Immediate Anesthesia Transfer of Care Note  Patient: [name]  Procedure(s) Performed: DEBRIDEMENT WOUND (Bilateral ) APPLICATION OF WOUND VAC (Bilateral )  Patient Location: PACU  Anesthesia Type:General  Level of Consciousness: awake, alert  and oriented  Airway & Oxygen Therapy: Patient Spontanous Breathing and Patient connected to face mask oxygen  Post-op Assessment: Report given to RN and Post -op Vital signs reviewed and stable  Post vital signs: Reviewed and stable  Last Vitals:  Vitals Value Taken Time  BP 143/91 09/15/19 1021  Temp 35.7 C 09/15/19 1021  Pulse 81 09/15/19 1022  Resp 19 09/15/19 1022  SpO2 99 % 09/15/19 1022  Vitals shown include unvalidated device data.  Last Pain:  Vitals:   09/15/19 0553  TempSrc: Oral  PainSc:          Complications: No apparent anesthesia complications

## 2019-09-15 NOTE — Progress Notes (Signed)
PROGRESS NOTE  [name] [id number] DOB: 08/23/1986 DOA: 09/14/2019 PCP: [facility]  Brief History   33 year old woman PMH end-stage renal disease, recently diagnosed with calciphylaxis, developed foul odor from ulcers of the lower extremities the last several days as well as sharp pain.  Status post full dialysis session 11/28.  Admitted for bilateral lower extremity infected ulcer secondary to suspect calciphylaxis in context of ESRD on hemodialysis.  A & P  Bilateral lower extremity infected ulcers secondary to suspected calciphylaxis in the context of ESRD on hemodialysis.  Sepsis considered on admission but lactic acid within normal limits and no evidence of new endorgan dysfunction.  Cultures pending. --Follow-up culture data.  Continue empiric antibiotics.  Appreciate surgical consultation.  Status post debridement, wound VAC placement and formal biopsy to confirm calciphylaxis diagnosis 11/29.Marland Kitchen  ESRD on hemodialysis Tuesday, Thursday, Saturday with associated anemia of CKD. --Management per nephrology, next hemodialysis will be Tuesday. --Hemoglobin stable.  Transfuse if hemoglobin drops below 7 or if patient develops symptoms.  Diabetes mellitus type 2.  Last hemoglobin A1c 5.1. --Diet controlled.  No new treatment or further evaluation suggested.  Chronic diastolic CHF --Continue carvedilol and torsemide  Essential hypertension --Appears stable.  Continue amlodipine, carvedilol, clonidine, losartan  Morbid obesity with Body mass index is 46.76 kg/m.  --Dietitian consultation  Chart review . PMH includes anemia of chronic disease, ESRD, diabetes mellitus type 2, morbid obesity, asthma, CHF . 05/2018 cardiology office visit for chronic diastolic CHF . [id number] General surgery office visit insertion of peritoneal dialysis catheter . 06/2019 hospitalization: Lower extremity cellulitis, PhosLo stopped secondary to calciphylaxis, patient started on  [facility] Problem list       DVT prophylaxis: heparin Code Status: Full Family Communication: none Disposition Plan: home    Murray Hodgkins, MD  [facility] Direct contact: see [email or web address] (further directions at bottom of note if needed) 7PM-7AM contact night coverage as at bottom of note 09/15/2019, 1:51 PM  LOS: 1 day   Significant Hospital Events   . 11/28 admitted for bilateral lower extremity infected wounds, suspected calciphylaxis . 11/28 General surgery consultation . 11/29   Consults:  . General surgery . Nephrology   Procedures:  . 11/29  Significant Diagnostic Tests:  . 11/28 EKG sinus rhythm, lateral T wave inversion, prolonged QT, new compared to previous study 07/02/2019   Micro Data:  . 11/28 SARS-CoV-2 negative . 11/28 blood cultures   Antimicrobials:  . Ceftriaxone 11/28  . Cefepime 11/28 > . Metronidazole 11/28 . Vancomycin 11/28 >   Interval History/Subjective  Seen postoperatively.  Seems to feel okay at the moment.  Legs are somewhat sore.  Objective   Vitals:  Vitals:   09/15/19 1120 09/15/19 1205  BP: (!) 139/94 134/90  Pulse: 85 84  Resp: 16 19  Temp:  97.8 F (36.6 C)  SpO2: 97% 98%    Exam:  Constitutional:  Appears calm, comfortable. Respiratory.  Clear to auscultation bilaterally.  No wheezes, rales or rhonchi.  Normal respiratory effort. Cardiovascular.  Regular rate and rhythm.  No murmur, rub or gallop. Skin.  Bilateral wound vacs in place on lower legs. Psychiatric.  Grossly normal mood and affect.  Speech fluent and appropriate.  I have personally reviewed the following:   Today's Data  . BMP consistent with ESRD.  Potassium 4.0. . Hemoglobin stable at 7.6, consistent with values from September.  Transfuse for less hemoglobin than 7 or if develops symptoms.  Scheduled Meds: . amLODipine  10 mg Oral Daily  . carvedilol  25 mg Oral BID WC  . Chlorhexidine Gluconate Cloth  6 each Topical  Daily  . cloNIDine  0.1 mg Oral TID  . ferric citrate  420 mg Oral TID  . gabapentin  100 mg Oral QHS  . heparin  5,000 Units Subcutaneous Q8H  . isosorbide mononitrate  30 mg Oral BID  . losartan  100 mg Oral QPM  . torsemide  40 mg Oral Daily   Continuous Infusions: . ceFEPime (MAXIPIME) IV    . metronidazole 500 mg (09/15/19 1228)  . [START ON 09/17/2019] vancomycin    . vancomycin      Active Problems:   Anemia in chronic kidney disease   Infection of anterior lower leg   Calciphylaxis   ESRD (end stage renal disease) (HCC)   Chronic diastolic CHF (congestive heart failure) (HCC)   Obesity, Class III, BMI 40-49.9 (morbid obesity) ([place])   LOS: 1 day   How to contact the [facility] Attending or Consulting provider 7A - 7P or covering provider during after hours [place], for this patient?  1. Check the care team in [facility] and look for a) attending/consulting TRH provider listed and b) the [facility] team listed 2. Log into [email or web address] and use [place]'s universal password to access. If you do not have the password, please contact the hospital operator. 3. Locate the [facility] provider you are looking for under [facility] and page to a number that you can be directly reached. 4. If you still have difficulty reaching the provider, please page the [facility] (Director on Call) for the Hospitalists listed on amion for assistance.     '

## 2019-09-16 ENCOUNTER — Encounter: Payer: Self-pay | Admitting: Surgery

## 2019-09-16 DIAGNOSIS — Z79899 Other long term (current) drug therapy: Secondary | ICD-10-CM

## 2019-09-16 DIAGNOSIS — I5032 Chronic diastolic (congestive) heart failure: Secondary | ICD-10-CM

## 2019-09-16 DIAGNOSIS — Z87891 Personal history of nicotine dependence: Secondary | ICD-10-CM

## 2019-09-16 DIAGNOSIS — Z978 Presence of other specified devices: Secondary | ICD-10-CM

## 2019-09-16 DIAGNOSIS — I132 Hypertensive heart and chronic kidney disease with heart failure and with stage 5 chronic kidney disease, or end stage renal disease: Secondary | ICD-10-CM

## 2019-09-16 DIAGNOSIS — E1152 Type 2 diabetes mellitus with diabetic peripheral angiopathy with gangrene: Secondary | ICD-10-CM

## 2019-09-16 DIAGNOSIS — Z992 Dependence on renal dialysis: Secondary | ICD-10-CM

## 2019-09-16 DIAGNOSIS — E1122 Type 2 diabetes mellitus with diabetic chronic kidney disease: Secondary | ICD-10-CM

## 2019-09-16 DIAGNOSIS — I96 Gangrene, not elsewhere classified: Principal | ICD-10-CM

## 2019-09-16 DIAGNOSIS — N186 End stage renal disease: Secondary | ICD-10-CM

## 2019-09-16 DIAGNOSIS — D631 Anemia in chronic kidney disease: Secondary | ICD-10-CM

## 2019-09-16 LAB — BASIC METABOLIC PANEL
Anion gap: 15 (ref 5–15)
BUN: 73 mg/dL — ABNORMAL HIGH (ref 6–20)
CO2: 19 mmol/L — ABNORMAL LOW (ref 22–32)
Calcium: 7.8 mg/dL — ABNORMAL LOW (ref 8.9–10.3)
Chloride: 105 mmol/L (ref 98–111)
Creatinine, Ser: 11 mg/dL — ABNORMAL HIGH (ref 0.44–1.00)
GFR calc Af Amer: 5 mL/min — ABNORMAL LOW (ref >60)
GFR calc non Af Amer: 4 mL/min — ABNORMAL LOW (ref >60)
Glucose, Bld: 98 mg/dL (ref 70–99)
Potassium: 5 mmol/L (ref 3.5–5.1)
Sodium: 139 mmol/L (ref 135–145)

## 2019-09-16 LAB — CBC
HCT: 22.9 % — ABNORMAL LOW (ref 36.0–46.0)
Hemoglobin: 7.2 g/dL — ABNORMAL LOW (ref 12.0–15.0)
MCH: 29.1 pg (ref 26.0–34.0)
MCHC: 31.4 g/dL (ref 30.0–36.0)
MCV: 92.7 fL (ref 80.0–100.0)
Platelets: 295 10*3/uL (ref 150–400)
RBC: 2.47 MIL/uL — ABNORMAL LOW (ref 3.87–5.11)
RDW: 16 % — ABNORMAL HIGH (ref 11.5–15.5)
WBC: 13 10*3/uL — ABNORMAL HIGH (ref 4.0–10.5)
nRBC: 0 % (ref 0.0–0.2)

## 2019-09-16 MED ORDER — RENA-VITE PO TABS
1.0000 | ORAL_TABLET | Freq: Every day | ORAL | Status: DC
  Administered 2019-09-16 – 2019-09-17 (×2): 1 via ORAL
  Filled 2019-09-16 (×2): qty 1

## 2019-09-16 MED ORDER — SODIUM THIOSULFATE 25 % IV SOLN
25.0000 g | INTRAVENOUS | Status: DC
  Administered 2019-09-17: 25 g via INTRAVENOUS
  Filled 2019-09-16 (×2): qty 100

## 2019-09-16 MED ORDER — ALBUTEROL SULFATE (2.5 MG/3ML) 0.083% IN NEBU
2.5000 mg | INHALATION_SOLUTION | RESPIRATORY_TRACT | Status: DC | PRN
  Administered 2019-09-16: 2.5 mg via RESPIRATORY_TRACT
  Filled 2019-09-16: qty 3

## 2019-09-16 MED ORDER — VITAMIN C 500 MG PO TABS
500.0000 mg | ORAL_TABLET | Freq: Two times a day (BID) | ORAL | Status: DC
  Administered 2019-09-16 – 2019-09-18 (×4): 500 mg via ORAL
  Filled 2019-09-16 (×4): qty 1

## 2019-09-16 MED ORDER — NEPRO/CARBSTEADY PO LIQD
237.0000 mL | Freq: Three times a day (TID) | ORAL | Status: DC
  Administered 2019-09-16: 237 mL via ORAL

## 2019-09-16 NOTE — Progress Notes (Signed)
PROGRESS NOTE  [name] [id number] DOB: March 30, 1986 DOA: 09/14/2019 [facility]: [facility]  Brief History   33 year old woman PMH end-stage renal disease, recently diagnosed with calciphylaxis, developed foul odor from ulcers of the lower extremities the last several days as well as sharp pain.  Status post full dialysis session 11/28.  Admitted for bilateral lower extremity infected ulcer secondary to suspect calciphylaxis in context of ESRD on hemodialysis.  A & P  Bilateral lower extremity infected ulcers secondary to suspected calciphylaxis in the context of ESRD on hemodialysis.  Sepsis considered on admission but lactic acid within normal limits and no evidence of new endorgan dysfunction. Status post debridement, wound VAC placement and formal biopsy to confirm calciphylaxis diagnosis 11/29. --Follow-up culture data.  Continue empiric antibiotics. --ID consulted for further recs  ESRD on hemodialysis Tuesday, Thursday, Saturday with associated anemia of CKD. --continue management per nephrology, next hemodialysis will be Tuesday. --Hemoglobin remains stable.  Transfuse if hemoglobin drops below 7 or if patient develops symptoms.  Diabetes mellitus type 2.  Last hemoglobin A1c 5.1. --remains diet-controlled.  No new treatment or further evaluation suggested.  Chronic diastolic CHF --Continue carvedilol and torsemide  Essential hypertension --appears stable.  Continue amlodipine, carvedilol, clonidine, losartan  Morbid obesity with Body mass index is 46.76 kg/m.  --Dietitian consultation  Chart review . PMH includes anemia of chronic disease, ESRD, diabetes mellitus type 2, morbid obesity, asthma, CHF . 05/2018 cardiology office visit for chronic diastolic CHF . [id number] General surgery office visit insertion of peritoneal dialysis catheter . 06/2019 hospitalization: Lower extremity cellulitis, PhosLo stopped secondary to calciphylaxis, patient started on  [facility] Problem list       DVT prophylaxis: heparin Code Status: Full Family Communication: none Disposition Plan: home    Murray Hodgkins, MD  [facility] Direct contact: see [email or web address] (further directions at bottom of note if needed) 7PM-7AM contact night coverage as at bottom of note 09/16/2019, 3:35 PM  LOS: 2 days   Significant Hospital Events   . 11/28 admitted for bilateral lower extremity infected wounds, suspected calciphylaxis . 11/28 General surgery consultation . 11/29   Consults:  . General surgery . Nephrology   Procedures:  . 11/29  Significant Diagnostic Tests:  . 11/28 EKG sinus rhythm, lateral T wave inversion, prolonged QT, new compared to previous study 07/02/2019   Micro Data:  . 11/28 SARS-CoV-2 negative . 11/28 blood cultures   Antimicrobials:  . Ceftriaxone 11/28  . Cefepime 11/28 > . Metronidazole 11/28 . Vancomycin 11/28 >   Interval History/Subjective  Feels okay today but did have nausea earlier.  Feels like she has some chest congestion.  Objective   Vitals:  Vitals:   09/16/19 0850 09/16/19 1229  BP: 128/86 128/79  Pulse:  94  Resp:  20  Temp:  99.7 F (37.6 C)  SpO2:  (!) 87%    Exam:  Constitutional.  Appears calm and comfortable. Respiratory.  Clear to auscultation bilaterally.  No wheezes, rales or rhonchi.  Normal respiratory effort. CV. RRR no m/r/g. No LE edema Psych.  Grossly normal mood and affect.  Speech fluent and appropriate.  I have personally reviewed the following:   Today's Data  . BMP consistent with ESRD.  Potassium 4.0. . Hemoglobin stable at 7.6, consistent with values from September.  Transfuse for less hemoglobin than 7 or if develops symptoms.  Scheduled Meds: . amLODipine  10 mg Oral Daily  . carvedilol  25 mg Oral BID WC  .  Chlorhexidine Gluconate Cloth  6 each Topical Daily  . cloNIDine  0.1 mg Oral TID  . feeding supplement (NEPRO CARB STEADY)  237 mL Oral  TID BM  . ferric citrate  420 mg Oral TID  . gabapentin  100 mg Oral QHS  . heparin  5,000 Units Subcutaneous Q8H  . isosorbide mononitrate  30 mg Oral BID  . losartan  100 mg Oral QPM  . multivitamin  1 tablet Oral QHS  . torsemide  40 mg Oral Daily  . vitamin C  500 mg Oral BID   Continuous Infusions: . sodium chloride Stopped (09/16/19 0242)  . ceFEPime (MAXIPIME) IV Stopped (09/15/19 2334)  . metronidazole 500 mg (09/16/19 1102)  . [START ON 09/17/2019] vancomycin    . vancomycin      Active Problems:   Anemia in chronic kidney disease   Infection of anterior lower leg   Calciphylaxis   ESRD (end stage renal disease) (HCC)   Chronic diastolic CHF (congestive heart failure) (HCC)   Obesity, Class III, BMI 40-49.9 (morbid obesity) ([place])   LOS: 2 days   How to contact the [facility] Attending or Consulting provider 7A - 7P or covering provider during after hours [place], for this patient?  1. Check the care team in [facility] and look for a) attending/consulting TRH provider listed and b) the [facility] team listed 2. Log into [email or web address] and use [place]'s universal password to access. If you do not have the password, please contact the hospital operator. 3. Locate the [facility] provider you are looking for under [facility] and page to a number that you can be directly reached. 4. If you still have difficulty reaching the provider, please page the [facility] (Director on Call) for the Hospitalists listed on amion for assistance.     '

## 2019-09-16 NOTE — Progress Notes (Signed)
Initial Nutrition Assessment  DOCUMENTATION CODES:   Morbid obesity  INTERVENTION:   Nepro Shake po TID, each supplement provides 425 kcal and 19 grams protein  Rena-vite daily   Vitamin C 500mg  po BID  NUTRITION DIAGNOSIS:   Increased nutrient needs related to chronic illness(ESRD on HD, wound healing) as evidenced by increased estimated needs.  GOAL:   Patient will meet greater than or equal to 90% of their needs  MONITOR:   PO intake, Supplement acceptance, Labs, Weight trends, Skin, I & O's  REASON FOR ASSESSMENT:   Consult Assessment of nutrition requirement/status  ASSESSMENT:   33 year old woman PMH end-stage renal disease, recently diagnosed with calciphylaxis admited with ulcers of the lower extremities now s/p I & D with VAC placement 11/29   Pt with fairly good appetite and oral intake in hospital; pt eating 100% of meals.  RD will add supplements and vitamins to support wound healing and replace losses from HD. Per chart, pt down 27lbs(9%) over the past 4 months; this is significant. RD suspects pt with poor appetite and oral intake at baseline. Will increase pt to 1583ml fluid restriction to help support wound healing.   Medications reviewed and include: ferric citrate, heparin, torsemide, cefepime, metronidazole, vancomycin   Labs reviewed: K 5.0 wnl, BUN 73(H), creat 11.00(H) Wbc- 13.0(H), Hgb 7.2(L), Hct 22.9(L)  NUTRITION - FOCUSED PHYSICAL EXAM:    Most Recent Value  Orbital Region  No depletion  Upper Arm Region  No depletion  Thoracic and Lumbar Region  No depletion  Buccal Region  No depletion  Temple Region  No depletion  Clavicle Bone Region  No depletion  Clavicle and Acromion Bone Region  No depletion  Scapular Bone Region  No depletion  Dorsal Hand  No depletion  Patellar Region  Unable to assess  Anterior Thigh Region  Unable to assess  Posterior Calf Region  Unable to assess  Edema (RD Assessment)  Unable to assess  Hair  Reviewed   Eyes  Reviewed  Mouth  Reviewed  Skin  Reviewed  Nails  Reviewed     Diet Order:   Diet Order            Diet renal with fluid restriction Fluid restriction: 1200 mL Fluid; Room service appropriate? Yes; Fluid consistency: Thin  Diet effective now             EDUCATION NEEDS:   Education needs have been addressed  Skin:  Skin Assessment: Reviewed RN Assessment(BLE wounds, VAC)  Last BM:  11/28  Height:   Ht Readings from Last 1 Encounters:  09/14/19 5\' 6"  (1.676 m)    Weight:   Wt Readings from Last 1 Encounters:  09/14/19 131.4 kg    Ideal Body Weight:  59 kg  BMI:  Body mass index is 46.76 kg/m.  Estimated Nutritional Needs:   Kcal:  2400-2700kcal/day  Protein:  >130g/day  Fluid:  UOP +1L  Koleen Distance MS, RD, LDN Pager #- [phone] Office#- [phone] After Hours Pager: [phone]

## 2019-09-16 NOTE — Consult Note (Signed)
NAME: [name]  DOB: Jan 10, 1986  MRN: [id number]  Date/Time: 09/16/2019 11:01 AM  REQUESTING PROVIDER: Dr. Sarajane Jews Subjective:  REASON FOR CONSULT: Bilateral leg wounds ? [name] is a 33 y.o. female with a history of end-stage renal disease on dialysis well-known to me from her last hospitalization September 2020 for bilateral calf lesions which were considered to be calciphylaxis is admitted with foul-smelling discharge from the wounds. Patient has been on dialysis since May 2020.  She has hypertension and anemia of chronic disease as well.  Patient noted painful swelling of both calf muscles and August 2020.  It gradually got worse and she found that her skin was discolored and indurated. During that hospitalization on stop" it was thought that it was calciphylaxis and initially she was on vancomycin cefepime Flagyl and it was stopped.  The nephrologist started treatment for calciphylaxis After discharge she saw a dermatologist and has been referred to [facility] and has an appointment this week.  But she does not like to the hospital on Saturday after hemodialysis session with foul-smelling odor and purulent discharge from the Wounds.  She denies any fever, cough or shortness of breath or chest pain.  She states that her neck pain is better than before. She has been started on IV vancomycin, cefepime and Flagyl.  She has undergone debridement of both the wounds by the surgeon on 09/15/2019 and has a wound VAC now. I am asked to see the patient for the same.   Past Medical History:  Diagnosis Date  . Anemia of chronic disease   . Asthma   . CHF (congestive heart failure) ([place])   . CKD (chronic kidney disease), stage III   . Diabetes ([place])    type 2  . Hypertension   . Morbid obesity ([place])     Past Surgical History:  Procedure Laterality Date  . APPLICATION OF WOUND VAC Bilateral 09/15/2019   Procedure: APPLICATION OF WOUND VAC;  Surgeon: Benjamine Sprague, DO;  Location: [facility]  ORS;  Service: General;  Laterality: Bilateral;  . DIALYSIS/PERMA CATHETER INSERTION N/A 02/28/2019   Procedure: DIALYSIS/PERMA CATHETER INSERTION;  Surgeon: Algernon Huxley, MD;  Location: [place] [facility] LAB;  Service: Cardiovascular;  Laterality: N/A;  . pd cath    . TONSILLECTOMY    . WOUND DEBRIDEMENT Bilateral 09/15/2019   Procedure: DEBRIDEMENT WOUND;  Surgeon: Benjamine Sprague, DO;  Location: [facility] ORS;  Service: General;  Laterality: Bilateral;    Social History   Socioeconomic History  . Marital status: Single    Spouse name: Not on file  . Number of children: Not on file  . Years of education: Not on file  . Highest education level: Not on file  Occupational History  . Occupation: works at [employer]  . Financial resource strain: Not on file  . Food insecurity    Worry: Not on file    Inability: Not on file  . Transportation needs    Medical: Not on file    Non-medical: Not on file  Tobacco Use  . Smoking status: Former Smoker    Types: Cigars  . Smokeless tobacco: Never Used  Substance and Sexual Activity  . Alcohol use: No    Alcohol/week: 0.0 standard drinks  . Drug use: No  . Sexual activity: Yes    Birth control/protection: None  Lifestyle  . Physical activity    Days per week: Not on file    Minutes per session: Not on file  .  Stress: Not on file  Relationships  . Social Herbalist on phone: Not on file    Gets together: Not on file    Attends religious service: Not on file    Active member of club or organization: Not on file    Attends meetings of clubs or organizations: Not on file    Relationship status: Not on file  . Intimate partner violence    Fear of current or ex partner: Not on file    Emotionally abused: Not on file    Physically abused: Not on file    Forced sexual activity: Not on file  Other Topics Concern  . Not on file  Social History Narrative  . Not on file    Family History  Problem Relation Age of Onset   . Hypertension Mother   . Multiple sclerosis Mother   . CAD Mother   . Cancer Maternal Grandmother        Ovarian   . Cancer Maternal Grandfather        Prostate   . Kidney disease Father    No Known Allergies  ? Current Facility-Administered Medications  Medication Dose Route Frequency Provider Last Rate Last Dose  . 0.9 %  sodium chloride infusion   Intravenous PRN Samuella Cota, MD   Stopped at 09/16/19 [phone]  . acetaminophen (TYLENOL) tablet 650 mg  650 mg Oral Q6H PRN Sakai, Isami, DO   650 mg at 09/15/19 0612  . albuterol (PROVENTIL) (2.5 MG/3ML) 0.083% nebulizer solution 2.5 mg  2.5 mg Inhalation Q4H PRN Kolluru, Sarath, MD      . amLODipine (NORVASC) tablet 10 mg  10 mg Oral Daily Loletha Grayer, MD   10 mg at 09/16/19 [id number]  . carvedilol (COREG) tablet 25 mg  25 mg Oral BID WC Loletha Grayer, MD   25 mg at 09/16/19 0851  . ceFEPIme (MAXIPIME) 1 g in sodium chloride 0.9 % 100 mL IVPB  1 g Intravenous Q24H [name], RPH   Stopped at 09/15/19 2334  . Chlorhexidine Gluconate Cloth 2 % PADS 6 each  6 each Topical Daily Samuella Cota, MD      . cloNIDine (CATAPRES) tablet 0.1 mg  0.1 mg Oral TID Loletha Grayer, MD   0.1 mg at 09/16/19 0851  . ferric citrate (AURYXIA) tablet 420 mg  420 mg Oral TID Loletha Grayer, MD   420 mg at 09/16/19 0850  . gabapentin (NEURONTIN) capsule 100 mg  100 mg Oral QHS Loletha Grayer, MD   100 mg at 09/15/19 2304  . heparin injection 5,000 Units  5,000 Units Subcutaneous Q8H Sakai, Isami, DO   5,000 Units at 09/15/19 2307  . isosorbide mononitrate (IMDUR) 24 hr tablet 30 mg  30 mg Oral BID Loletha Grayer, MD   30 mg at 09/16/19 0852  . losartan (COZAAR) tablet 100 mg  100 mg Oral QPM Loletha Grayer, MD   100 mg at 09/15/19 1704  . metroNIDAZOLE (FLAGYL) IVPB 500 mg  500 mg Intravenous Q8H Loletha Grayer, MD 100 mL/hr at 09/16/19 0243    . ondansetron (ZOFRAN) tablet 4 mg  4 mg Oral Q6H PRN Loletha Grayer, MD       Or  .  ondansetron [facility]) injection 4 mg  4 mg Intravenous Q6H PRN Loletha Grayer, MD   4 mg at 09/16/19 0426  . oxyCODONE (Oxy IR/ROXICODONE) immediate release tablet 5 mg  5 mg Oral Q4H PRN Loletha Grayer, MD  5 mg at 09/15/19 2317  . torsemide (DEMADEX) tablet 40 mg  40 mg Oral Daily Loletha Grayer, MD   40 mg at 09/16/19 0851  . [START ON 09/17/2019] vancomycin (VANCOCIN) IVPB 1000 mg/200 mL premix  1,000 mg Intravenous Q T,Th,Sa-HD Rito Ehrlich A, RPH      . vancomycin (VANCOCIN) IVPB 1000 mg/200 mL premix  1,000 mg Intravenous Once Rito Ehrlich A, RPH         Abtx:  Anti-infectives (From admission, onward)   Start     Dose/Rate Route Frequency Ordered Stop   09/17/19 1200  vancomycin (VANCOCIN) IVPB 1000 mg/200 mL premix     1,000 mg 200 mL/hr over 60 Minutes Intravenous Every T-Th-Sa (Hemodialysis) 09/14/19 2053     09/14/19 2100  vancomycin (VANCOCIN) IVPB 1000 mg/200 mL premix     1,000 mg 200 mL/hr over 60 Minutes Intravenous  Once 09/14/19 2054     09/14/19 2100  ceFEPIme (MAXIPIME) 1 g in sodium chloride 0.9 % 100 mL IVPB     1 g 200 mL/hr over 30 Minutes Intravenous Every 24 hours 09/14/19 2058     09/14/19 1830  metroNIDAZOLE (FLAGYL) IVPB 500 mg     500 mg 100 mL/hr over 60 Minutes Intravenous Every 8 hours 09/14/19 1815     09/14/19 1800  vancomycin (VANCOCIN) IVPB 1000 mg/200 mL premix     1,000 mg 200 mL/hr over 60 Minutes Intravenous  Once 09/14/19 1751 09/14/19 2008   09/14/19 1800  cefTRIAXone (ROCEPHIN) 2 g in sodium chloride 0.9 % 100 mL IVPB     2 g 200 mL/hr over 30 Minutes Intravenous  Once 09/14/19 1751 09/14/19 1905      REVIEW OF SYSTEMS:  Const: negative fever, negative chills, negative weight loss Eyes: negative diplopia or visual changes, negative eye pain ENT: negative coryza, negative sore throat Resp: negative cough, hemoptysis, dyspnea Cards: negative for chest pain, palpitations, lower extremity edema GU: negative for frequency, dysuria and  hematuria GI: Negative for abdominal pain, diarrhea, bleeding, constipation Skin: negative for rash and pruritus Heme: negative for easy bruising and gum/nose bleeding MS: negative for myalgias, arthralgias, back pain and muscle weakness Neurolo:negative for headaches, dizziness, vertigo, memory problems  Psych: negative for feelings of anxiety, depression  Endocrine: negative for thyroid, diabetes Allergy/Immunology- negative for any medication or food allergies ?  Objective:  VITALS:  BP 128/86   Pulse 91   Temp 98.3 F (36.8 C) (Oral)   Resp 18   Ht 5\' 6"  (1.676 m)   Wt 131.4 kg   LMP 09/04/2019 (Exact Date) Comment: stated she could not be pregnant  SpO2 95%   BMI 46.76 kg/m  PHYSICAL EXAM:  General: Alert, cooperative, no distress, appears stated age.  Head: Normocephalic, without obvious abnormality, atraumatic. Eyes: Conjunctivae clear, anicteric sclerae. Pupils are equal ENT Nares normal. No drainage or sinus tenderness. Lips, mucosa, and tongue normal. No Thrush Neck: Supple, symmetrical, no adenopathy, thyroid: non tender no carotid bruit and no JVD. Back: No CVA tenderness. Lungs: Clear to auscultation bilaterally. No Wheezing or Rhonchi. No rales. Heart: Regular rate and rhythm, no murmur, rub or gallop. Right IJ permacath Abdomen: Soft, PD catheter Extremities:  Bilateral Surgical site covered with wound VAC.  Reviewed pictures 08/14/19  08/14/19  08/14/19     07/08/19   07/08/19     Skin: No rashes or lesions. Or bruising Lymph: Cervical, supraclavicular normal. Neurologic: Grossly non-focal Pertinent Labs Lab Results CBC    Component  Value Date/Time   WBC 13.0 (H) 09/16/2019 0415   RBC 2.47 (L) 09/16/2019 0415   HGB 7.2 (L) 09/16/2019 0415   HGB 12.5 12/31/2013 1020   HCT 22.9 (L) 09/16/2019 0415   HCT 38.5 12/31/2013 1020   PLT 295 09/16/2019 0415   PLT 239 12/31/2013 1020   MCV 92.7 09/16/2019 0415   MCV 84 12/31/2013 1020   MCH  29.1 09/16/2019 0415   MCHC 31.4 09/16/2019 0415   RDW 16.0 (H) 09/16/2019 0415   RDW 15.2 (H) 12/31/2013 1020   LYMPHSABS 1.8 09/14/2019 1459   MONOABS 0.7 09/14/2019 1459   EOSABS 0.6 (H) 09/14/2019 1459   BASOSABS 0.1 09/14/2019 1459    CMP Latest Ref Rng & Units 09/16/2019 09/15/2019 09/15/2019  Glucose 70 - 99 mg/dL 98 - 97  BUN 6 - 20 mg/dL 73(H) - 64(H)  Creatinine 0.44 - 1.00 mg/dL 11.00(H) 10.26(H) 9.58(H)  Sodium 135 - 145 mmol/L 139 - 140  Potassium 3.5 - 5.1 mmol/L 5.0 - 4.0  Chloride 98 - 111 mmol/L 105 - 106  CO2 22 - 32 mmol/L 19(L) - 19(L)  Calcium 8.9 - 10.3 mg/dL 7.8(L) - 8.1(L)  Total Protein 6.5 - 8.1 g/dL - - -  Total Bilirubin 0.3 - 1.2 mg/dL - - -  Alkaline Phos 38 - 126 U/L - - -  AST 15 - 41 U/L - - -  ALT 0 - 44 U/L - - -      Microbiology: Recent Results (from the past 240 hour(s))  SARS CORONAVIRUS 2 (TAT 6-24 HRS) Nasopharyngeal Nasopharyngeal Swab     Status: None   Collection Time: 09/14/19  5:52 PM   Specimen: Nasopharyngeal Swab  Result Value Ref Range Status   SARS Coronavirus 2 NEGATIVE NEGATIVE Final    Comment: (NOTE) SARS-CoV-2 target nucleic acids are NOT DETECTED. The SARS-CoV-2 RNA is generally detectable in upper and lower respiratory specimens during the acute phase of infection. Negative results do not preclude SARS-CoV-2 infection, do not rule out co-infections with other pathogens, and should not be used as the sole basis for treatment or other patient management decisions. Negative results must be combined with clinical observations, patient history, and epidemiological information. The expected result is Negative. Fact Sheet for Patients: SugarRoll.be Fact Sheet for Healthcare Providers: https://www.woods-mathews.com/ This test is not yet approved or cleared by the [place] FDA and  has been authorized for detection and/or diagnosis of SARS-CoV-2 by FDA under an Emergency  Use Authorization (EUA). This EUA will remain  in effect (meaning this test can be used) for the duration of the COVID-19 declaration under Section 56 4(b)(1) of the Act, 21 U.S.C. section 360bbb-3(b)(1), unless the authorization is terminated or revoked sooner. Performed at [facility], [place] [address], [place], [place] [address]   Blood Culture (routine x 2)     Status: None (Preliminary result)   Collection Time: 09/14/19  6:26 PM   Specimen: BLOOD  Result Value Ref Range Status   Specimen Description BLOOD RIGHT ANTECUBITAL  Final   Special Requests   Final    BOTTLES DRAWN AEROBIC AND ANAEROBIC Blood Culture adequate volume   Culture   Final    NO GROWTH 2 DAYS Performed at [facility], [address], [place], [place] [address]    Report Status PENDING  Incomplete  Blood Culture (routine x 2)     Status: None (Preliminary result)   Collection Time: 09/14/19  6:26 PM   Specimen:  BLOOD  Result Value Ref Range Status   Specimen Description BLOOD RIGHT ANTECUBITAL  Final   Special Requests   Final    BOTTLES DRAWN AEROBIC AND ANAEROBIC Blood Culture adequate volume   Culture   Final    NO GROWTH 2 DAYS Performed at [facility], [address] [place], [place] [address]    Report Status PENDING  Incomplete    IMAGING RESULTS: No studies  ? Impression/Recommendation Bilateral necrotic wounds both calf.  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  Biopsy has been sent this admission. Full thickness debridement has been done.  No surgical cultures were sent even though purulence was noted. Continue vancomycin, cefepime and Flagyl. Recommend during the next wound VAC change to send cultures for aerobic anaerobic, fungal and mycobacterial cultures.  End-stage renal disease on hemodialysis.  Peritoneal dialysis catheter has been placed and she is waiting to start PD.  Anemia of chronic renal disease.  Diabetes mellitus: Diet controlled now.  Not on insulin  anymore. ? ?Essential hypertension on amlodipine, carvedilol, clonidine and losartan.  Chronic diastolic CHF.  On carvedilol and torsemide.   ___________________________________________________ Discussed with patient and care team. Note:  This document was prepared using Dragon voice recognition software and may include unintentional dictation errors.

## 2019-09-16 NOTE — Progress Notes (Signed)
Established hemodialysis patient known at [facility] TTS 6:45, patient transports self. Please contact me with any dialysis placement concerns.   Elvera Bicker Dialysis Coordinator [phone]

## 2019-09-16 NOTE — Consult Note (Addendum)
WOC Nurse Consult Note: Pt had bilat Vac dressings applied in the OR on 11/29 after surgical debridement for several full thickness wounds.  Discussed dressing change schedule with Dr Lysle Pearl via secure chat; Vac dressings requested to be changed by [facility] nurse on Wed.  Vac intact with good seal at 169mm cont suction to one location on the left leg and two locations on the right leg.   A WOC team member will performed dressing change on Wed as requested.  Julien Girt MSN, RN, [place], [place], [place]

## 2019-09-16 NOTE — Progress Notes (Signed)
Subjective:  CC: [name] is a 33 y.o. female  Hospital stay day 2, 1 Day Post-Op BLE wounds debridement and wound VAC placement  HPI: No acute issues overnight.  ROS:  General: Denies weight loss, weight gain, fatigue, fevers, chills, and night sweats. Heart: Denies chest pain, palpitations, racing heart, irregular heartbeat, leg pain or swelling, and decreased activity tolerance. Respiratory: Denies breathing difficulty, shortness of breath, wheezing, cough, and sputum. GI: Denies change in appetite, heartburn, nausea, vomiting, constipation, diarrhea, and blood in stool. GU: Denies difficulty urinating, pain with urinating, urgency, frequency, blood in urine.   Objective:   Temp:  [96.3 F (35.7 C)-98.3 F (36.8 C)] 98.3 F (36.8 C) (11/30 0455) Pulse Rate:  [80-98] 91 (11/30 0455) Resp:  [12-25] 18 (11/30 0455) BP: (114-156)/(67-94) 127/75 (11/30 0455) SpO2:  [86 %-98 %] 95 % (11/30 0455)     Height: 5\' 6"  (167.6 cm) Weight: 131.4 kg BMI (Calculated): 46.78   Intake/Output this shift:   Intake/Output Summary (Last 24 hours) at 09/16/2019 0717 Last data filed at 09/16/2019 [id number] Gross per 24 hour  Intake 1056.08 ml  Output 450 ml  Net 606.08 ml    Constitutional :  alert, cooperative, appears stated age and no distress  Respiratory:  clear to auscultation bilaterally  Cardiovascular:  regular rate and rhythm  Gastrointestinal: soft, non-tender; bowel sounds normal; no masses,  no organomegaly.   Skin: Cool and moist.  Wound vacs intact, no drainage noted in canister.  Psychiatric: Normal affect, non-agitated, not confused       LABS:  CMP Latest Ref Rng & Units 09/16/2019 09/15/2019 09/15/2019  Glucose 70 - 99 mg/dL 98 - 97  BUN 6 - 20 mg/dL 73(H) - 64(H)  Creatinine 0.44 - 1.00 mg/dL 11.00(H) 10.26(H) 9.58(H)  Sodium 135 - 145 mmol/L 139 - 140  Potassium 3.5 - 5.1 mmol/L 5.0 - 4.0  Chloride 98 - 111 mmol/L 105 - 106  CO2 22 - 32 mmol/L 19(L) - 19(L)   Calcium 8.9 - 10.3 mg/dL 7.8(L) - 8.1(L)  Total Protein 6.5 - 8.1 g/dL - - -  Total Bilirubin 0.3 - 1.2 mg/dL - - -  Alkaline Phos 38 - 126 U/L - - -  AST 15 - 41 U/L - - -  ALT 0 - 44 U/L - - -   CBC Latest Ref Rng & Units 09/16/2019 09/15/2019 09/15/2019  WBC 4.0 - 10.5 K/uL 13.0(H) 14.9(H) 15.0(H)  Hemoglobin 12.0 - 15.0 g/dL 7.2(L) 7.6(L) 7.0(L)  Hematocrit 36.0 - 46.0 % 22.9(L) 24.0(L) 22.6(L)  Platelets 150 - 400 K/uL 295 289 261    RADS: n/a Assessment:   BLE wounds debridement and wound VAC placement  possible calciphylaxis.  Path pending.  She remains comfortable.  Medical management per primary team.  First dressing change tomorrow at bedside.  We will have wound care nurse on board as well for continued care.

## 2019-09-16 NOTE — Progress Notes (Signed)
Notified Dr. Sarajane Jews that patient has vomited twice and I gave her Zofran. Also patients o2 sat was 86-88 on room air. I place patient on 2 liters via nasal cannula and got order from Dr. Kermit Balo rich for oxygen continuous. Patient came up to 94% on 2L

## 2019-09-16 NOTE — Progress Notes (Signed)
[facility]  ROUNDING NOTE   Subjective:   Patient states her pain is well controlled.   Objective:  Vital signs in last 24 hours:  Temp:  [97.6 F (36.4 C)-99.7 F (37.6 C)] 99.7 F (37.6 C) (11/30 1229) Pulse Rate:  [87-98] 94 (11/30 1229) Resp:  [18-20] 20 (11/30 1229) BP: (122-156)/(67-90) 128/79 (11/30 1229) SpO2:  [87 %-98 %] 87 % (11/30 1229)  Weight change:  Filed Weights   09/14/19 1447 09/14/19 2151  Weight: 127 kg 131.4 kg    Intake/Output: I/O last 3 completed shifts: In: [id number] [P.O.:240; I.V.:415.8; IV Piggyback:800.3] Out: 750 [Urine:600; Blood:150]   Intake/Output this shift:  Total I/O In: 240 [P.O.:240] Out: -   Physical Exam: General: NAD,   Head: Normocephalic, atraumatic. Moist oral mucosal membranes  Eyes: Anicteric, PERRL  Neck: Supple, trachea midline  Lungs:  Clear to auscultation  Heart: Regular rate and rhythm  Abdomen:  Soft, nontender,   Extremities:  bilateral wounds in wound vac bilaterally  Neurologic: Nonfocal, moving all four extremities  Skin: No lesions  Access: RIJ permcath    Basic Metabolic Panel: Recent Labs  Lab 09/14/19 1459 09/15/19 0434 09/15/19 1135 09/16/19 0415  NA 138 140  --  139  K 3.4* 4.0  --  5.0  CL 103 106  --  105  CO2 21* 19*  --  19*  GLUCOSE 124* 97  --  98  BUN 57* 64*  --  73*  CREATININE 8.91* 9.58* 10.26* 11.00*  CALCIUM 8.0* 8.1*  --  7.8*    Liver Function Tests: Recent Labs  Lab 09/14/19 1459  AST 14*  ALT 16  ALKPHOS 81  BILITOT 0.8  PROT 7.4  ALBUMIN 3.0*   No results for input(s): LIPASE, AMYLASE in the last 168 hours. No results for input(s): AMMONIA in the last 168 hours.  CBC: Recent Labs  Lab 09/14/19 1459 09/15/19 0434 09/15/19 1135 09/16/19 0415  WBC 15.1* 15.0* 14.9* 13.0*  NEUTROABS 11.9*  --   --   --   HGB 8.4* 7.0* 7.6* 7.2*  HCT 25.5* 22.6* 24.0* 22.9*  MCV 90.1 94.6 93.4 92.7  PLT 288 261 289 295    Cardiac Enzymes: No results for  input(s): CKTOTAL, CKMB, CKMBINDEX, TROPONINI in the last 168 hours.  BNP: Invalid input(s): POCBNP  CBG: Recent Labs  Lab 09/15/19 1024  GLUCAP 90    Microbiology: Results for orders placed or performed during the hospital encounter of 09/14/19  SARS CORONAVIRUS 2 (TAT 6-24 HRS) Nasopharyngeal Nasopharyngeal Swab     Status: None   Collection Time: 09/14/19  5:52 PM   Specimen: Nasopharyngeal Swab  Result Value Ref Range Status   SARS Coronavirus 2 NEGATIVE NEGATIVE Final    Comment: (NOTE) SARS-CoV-2 target nucleic acids are NOT DETECTED. The SARS-CoV-2 RNA is generally detectable in upper and lower respiratory specimens during the acute phase of infection. Negative results do not preclude SARS-CoV-2 infection, do not rule out co-infections with other pathogens, and should not be used as the sole basis for treatment or other patient management decisions. Negative results must be combined with clinical observations, patient history, and epidemiological information. The expected result is Negative. Fact Sheet for Patients: SugarRoll.be Fact Sheet for Healthcare Providers: https://www.woods-mathews.com/ This test is not yet approved or cleared by the [place] FDA and  has been authorized for detection and/or diagnosis of SARS-CoV-2 by FDA under an Emergency Use Authorization (EUA). This EUA will remain  in effect (meaning  this test can be used) for the duration of the COVID-19 declaration under Section 56 4(b)(1) of the Act, 21 U.S.C. section 360bbb-3(b)(1), unless the authorization is terminated or revoked sooner. Performed at [facility], [employer] [address] [place], [place] [address]   Blood Culture (routine x 2)     Status: None (Preliminary result)   Collection Time: 09/14/19  6:26 PM   Specimen: BLOOD  Result Value Ref Range Status   Specimen Description BLOOD RIGHT ANTECUBITAL  Final   Special Requests   Final     BOTTLES DRAWN AEROBIC AND ANAEROBIC Blood Culture adequate volume   Culture   Final    NO GROWTH 2 DAYS Performed at [facility], [address] [place], [place] [address]    Report Status PENDING  Incomplete  Blood Culture (routine x 2)     Status: None (Preliminary result)   Collection Time: 09/14/19  6:26 PM   Specimen: BLOOD  Result Value Ref Range Status   Specimen Description BLOOD RIGHT ANTECUBITAL  Final   Special Requests   Final    BOTTLES DRAWN AEROBIC AND ANAEROBIC Blood Culture adequate volume   Culture   Final    NO GROWTH 2 DAYS Performed at [facility], [address] [place], [place] [address]    Report Status PENDING  Incomplete    Coagulation Studies: No results for input(s): LABPROT, INR in the last 72 hours.  Urinalysis: No results for input(s): COLORURINE, LABSPEC, PHURINE, GLUCOSEU, HGBUR, BILIRUBINUR, KETONESUR, PROTEINUR, UROBILINOGEN, NITRITE, LEUKOCYTESUR in the last 72 hours.  Invalid input(s): APPERANCEUR    Imaging: No results found.   Medications:   . sodium chloride Stopped (09/16/19 0242)  . ceFEPime (MAXIPIME) IV Stopped (09/15/19 2334)  . metronidazole 500 mg (09/16/19 1102)  . [START ON 09/17/2019] vancomycin    . vancomycin     . amLODipine  10 mg Oral Daily  . carvedilol  25 mg Oral BID WC  . Chlorhexidine Gluconate Cloth  6 each Topical Daily  . cloNIDine  0.1 mg Oral TID  . feeding supplement (NEPRO CARB STEADY)  237 mL Oral TID BM  . ferric citrate  420 mg Oral TID  . gabapentin  100 mg Oral QHS  . heparin  5,000 Units Subcutaneous Q8H  . isosorbide mononitrate  30 mg Oral BID  . losartan  100 mg Oral QPM  . multivitamin  1 tablet Oral QHS  . torsemide  40 mg Oral Daily  . vitamin C  500 mg Oral BID   sodium chloride, acetaminophen **OR** [DISCONTINUED] acetaminophen, albuterol, ondansetron **OR** ondansetron (ZOFRAN) IV, oxyCODONE  Assessment/ Plan:  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 End Stage renal disease:  Hemodialysis for tomorrow  2. Hypertension:  - losartan, torsemide, carvedilol, amlodipine, clonidine, and isosorbide mononitrate.   3. Anemia of chronic kidney diseae - EPO with HD treatment  4. Secondary Hyperparathyroidism: with calciphylaxis - ferric citrate - Sodium thiosulfate.   5. Bilateral lower extremity wounds - wound vac placed by Dr. Lysle Pearl. Appreciate input from surgery and Infectious disease - empiric vancomycin, cefepime and metronidazole.    LOS: 2 Charli Halle 11/30/20204:28 PM

## 2019-09-17 ENCOUNTER — Encounter: Payer: Self-pay | Admitting: Registered Nurse

## 2019-09-17 ENCOUNTER — Encounter
Admission: EM | Disposition: A | Discharge status: Home Health | Payer: Self-pay | Source: Home / Self Care | Attending: Family Medicine

## 2019-09-17 ENCOUNTER — Other Ambulatory Visit (INDEPENDENT_AMBULATORY_CARE_PROVIDER_SITE_OTHER): Payer: Self-pay | Admitting: Vascular Surgery

## 2019-09-17 DIAGNOSIS — L03116 Cellulitis of left lower limb: Secondary | ICD-10-CM

## 2019-09-17 DIAGNOSIS — T82898A Other specified complication of vascular prosthetic devices, implants and grafts, initial encounter: Secondary | ICD-10-CM

## 2019-09-17 DIAGNOSIS — N186 End stage renal disease: Secondary | ICD-10-CM

## 2019-09-17 DIAGNOSIS — E119 Type 2 diabetes mellitus without complications: Secondary | ICD-10-CM

## 2019-09-17 DIAGNOSIS — Z992 Dependence on renal dialysis: Secondary | ICD-10-CM

## 2019-09-17 HISTORY — PX: DIALYSIS/PERMA CATHETER INSERTION: CATH118288

## 2019-09-17 LAB — RENAL FUNCTION PANEL
Albumin: 2.8 g/dL — ABNORMAL LOW (ref 3.5–5.0)
Anion gap: 13 (ref 5–15)
BUN: 79 mg/dL — ABNORMAL HIGH (ref 6–20)
CO2: 19 mmol/L — ABNORMAL LOW (ref 22–32)
Calcium: 7.8 mg/dL — ABNORMAL LOW (ref 8.9–10.3)
Chloride: 107 mmol/L (ref 98–111)
Creatinine, Ser: 12.92 mg/dL — ABNORMAL HIGH (ref 0.44–1.00)
GFR calc Af Amer: 4 mL/min — ABNORMAL LOW (ref >60)
GFR calc non Af Amer: 3 mL/min — ABNORMAL LOW (ref >60)
Glucose, Bld: 92 mg/dL (ref 70–99)
Phosphorus: 9.9 mg/dL — ABNORMAL HIGH (ref 2.5–4.6)
Potassium: 5.1 mmol/L (ref 3.5–5.1)
Sodium: 139 mmol/L (ref 135–145)

## 2019-09-17 LAB — CBC
HCT: 21.4 % — ABNORMAL LOW (ref 36.0–46.0)
HCT: 23 % — ABNORMAL LOW (ref 36.0–46.0)
Hemoglobin: 6.8 g/dL — ABNORMAL LOW (ref 12.0–15.0)
Hemoglobin: 7.1 g/dL — ABNORMAL LOW (ref 12.0–15.0)
MCH: 29.6 pg (ref 26.0–34.0)
MCH: 29.8 pg (ref 26.0–34.0)
MCHC: 31.8 g/dL (ref 30.0–36.0)
MCHC: 30.9 g/dL (ref 30.0–36.0)
MCV: 93 fL (ref 80.0–100.0)
MCV: 96.6 fL (ref 80.0–100.0)
Platelets: 279 10*3/uL (ref 150–400)
Platelets: 291 10*3/uL (ref 150–400)
RBC: 2.3 MIL/uL — ABNORMAL LOW (ref 3.87–5.11)
RBC: 2.38 MIL/uL — ABNORMAL LOW (ref 3.87–5.11)
RDW: 15.9 % — ABNORMAL HIGH (ref 11.5–15.5)
RDW: 16.3 % — ABNORMAL HIGH (ref 11.5–15.5)
WBC: 8.6 10*3/uL (ref 4.0–10.5)
WBC: 7.8 10*3/uL (ref 4.0–10.5)
nRBC: 0 % (ref 0.0–0.2)
nRBC: 0 % (ref 0.0–0.2)

## 2019-09-17 LAB — SURGICAL PATHOLOGY

## 2019-09-17 SURGERY — DIALYSIS/PERMA CATHETER INSERTION
Anesthesia: Choice

## 2019-09-17 MED ORDER — FENTANYL CITRATE (PF) 100 MCG/2ML IJ SOLN
INTRAMUSCULAR | Status: AC
  Filled 2019-09-17: qty 2

## 2019-09-17 MED ORDER — METHYLPREDNISOLONE SODIUM SUCC 125 MG IJ SOLR: 125.0000 mg | Freq: Once | INTRAMUSCULAR | Status: DC | PRN

## 2019-09-17 MED ORDER — MIDAZOLAM HCL 5 MG/5ML IJ SOLN
INTRAMUSCULAR | Status: AC
  Filled 2019-09-17: qty 5

## 2019-09-17 MED ORDER — MIDAZOLAM HCL 2 MG/2ML IJ SOLN
INTRAMUSCULAR | Status: DC | PRN
  Administered 2019-09-17: 1 mg via INTRAVENOUS
  Administered 2019-09-17 (×2): 0.5 mg via INTRAVENOUS

## 2019-09-17 MED ORDER — FAMOTIDINE 40 MG PO TABS
40.0000 mg | ORAL_TABLET | Freq: Once | ORAL | Status: DC | PRN
  Filled 2019-09-17: qty 1

## 2019-09-17 MED ORDER — METOCLOPRAMIDE HCL 5 MG/5ML PO SOLN
10.0000 mg | Freq: Four times a day (QID) | ORAL | Status: DC | PRN
  Administered 2019-09-17: 10 mg via ORAL
  Filled 2019-09-17 (×2): qty 10

## 2019-09-17 MED ORDER — ACETAMINOPHEN 325 MG PO TABS
ORAL_TABLET | ORAL | Status: AC
  Administered 2019-09-17: 16:00:00 650 mg via ORAL
  Filled 2019-09-17: qty 2

## 2019-09-17 MED ORDER — DIPHENHYDRAMINE HCL 50 MG/ML IJ SOLN
INTRAMUSCULAR | Status: AC
  Administered 2019-09-17: 16:00:00 25 mg via INTRAVENOUS
  Filled 2019-09-17: qty 1

## 2019-09-17 MED ORDER — SODIUM CHLORIDE 0.9 % IV SOLN
INTRAVENOUS | Status: DC
  Administered 2019-09-17: 15:00:00 via INTRAVENOUS

## 2019-09-17 MED ORDER — LABETALOL HCL 5 MG/ML IV SOLN
INTRAVENOUS | Status: AC
  Filled 2019-09-17: qty 4

## 2019-09-17 MED ORDER — SODIUM CHLORIDE 0.9% IV SOLUTION: Freq: Once | INTRAVENOUS | Status: DC

## 2019-09-17 MED ORDER — HYDROMORPHONE HCL 1 MG/ML IJ SOLN: 1.0000 mg | Freq: Once | INTRAMUSCULAR | Status: DC | PRN

## 2019-09-17 MED ORDER — MIDAZOLAM HCL 2 MG/ML PO SYRP: 8.0000 mg | ORAL_SOLUTION | Freq: Once | ORAL | Status: DC | PRN

## 2019-09-17 MED ORDER — EPOETIN ALFA 10000 UNIT/ML IJ SOLN: 10000.0000 [IU] | INTRAMUSCULAR | Status: DC

## 2019-09-17 MED ORDER — CEFAZOLIN SODIUM-DEXTROSE 1-4 GM/50ML-% IV SOLN
1.0000 g | Freq: Once | INTRAVENOUS | Status: AC
  Administered 2019-09-17: 16:00:00 1 g via INTRAVENOUS

## 2019-09-17 MED ORDER — DIPHENHYDRAMINE HCL 50 MG/ML IJ SOLN: 50.0000 mg | Freq: Once | INTRAMUSCULAR | Status: DC | PRN

## 2019-09-17 MED ORDER — DIPHENHYDRAMINE HCL 50 MG/ML IJ SOLN
25.0000 mg | Freq: Once | INTRAMUSCULAR | Status: AC
  Administered 2019-09-17: 16:00:00 25 mg via INTRAVENOUS

## 2019-09-17 MED ORDER — ONDANSETRON HCL 4 MG/2ML IJ SOLN
4.0000 mg | Freq: Four times a day (QID) | INTRAMUSCULAR | Status: DC | PRN
  Filled 2019-09-17: qty 2

## 2019-09-17 MED ORDER — FENTANYL CITRATE (PF) 100 MCG/2ML IJ SOLN
INTRAMUSCULAR | Status: DC | PRN
  Administered 2019-09-17: 50 ug via INTRAVENOUS
  Administered 2019-09-17: 25 ug via INTRAVENOUS
  Administered 2019-09-17: 25 ug
  Administered 2019-09-17: 25 ug via INTRAVENOUS

## 2019-09-17 SURGICAL SUPPLY — 5 items
CATH PALINDROME-P 19CM W/VT (CATHETERS) ×3 IMPLANT
GUIDEWIRE SUPER STIFF .035X180 (WIRE) ×3 IMPLANT
PACK ANGIOGRAPHY (CUSTOM PROCEDURE TRAY) ×3 IMPLANT
SUT MNCRL AB 4-0 PS2 18 (SUTURE) ×3 IMPLANT
SUT SILK 0 FSL (SUTURE) ×3 IMPLANT

## 2019-09-17 NOTE — Op Note (Signed)
OPERATIVE NOTE   PROCEDURE: 1. Insertion of tunneled dialysis catheter right IJ approach same venous access.  PRE-OPERATIVE DIAGNOSIS: Complication of existing tunneled dialysis catheter with exposed cuff, and stage renal disease requiring hemodialysis   POST-OPERATIVE DIAGNOSIS: Same SURGEON: Hortencia Pilar  ANESTHESIA: Conscious sedation was administered under my direct supervision by the interventional radiology RN.  IV Versed plus fentanyl were utilized. Continuous ECG, pulse oximetry and blood pressure was monitored throughout the entire procedure.  Conscious sedation was for a total of 25 minutes.  ESTIMATED BLOOD LOSS: Minimal cc  CONTRAST USED:  None  FLUOROSCOPY TIME: 1.0 minutes  INDICATIONS:   [name] is a 33 y.o.y.o. female who presents with poor flow and nonfunction of the tunneled dialysis catheter.  Adequate dialysis has not been possible.  DESCRIPTION: After obtaining full informed written consent, the patient was positioned supine. The right neck and chest wall was prepped and draped in a sterile fashion. The cuff is localized and using blunt and sharp dissection it is freed from the surrounding adhesions.  The existing catheter is then transected proximal to the cuff.  The guidewire is advanced without difficulty under fluoroscopy.  Dilators are passed over the wire as needed and the tunneled dialysis catheter is fed into the central venous system without difficulty.  Under fluoroscopy the catheter tip positioned at the atrial caval junction.  Both lumens aspirate and flush easily. After verification of smooth contour with proper tip position under fluoroscopy the catheter is packed with 5000 units of heparin per lumen.  Catheter secured to the skin of the right chest with 0 silk. A sterile dressing is applied with a Biopatch.  COMPLICATIONS: None  CONDITION: Good  Hortencia Pilar [facility] Office:  [phone]   09/17/2019,4:38  PM

## 2019-09-17 NOTE — Care Management Important Message (Signed)
Important Message  Patient Details  Name: [name] MRN: [id number] Date of Birth: [birth year]   Medicare Important Message Given:  Yes  Initial Medicare IM given by Patient Access Associate on 09/17/2019 at 10:47am.   Dannette Barbara 09/17/2019, 11:16 AM

## 2019-09-17 NOTE — Progress Notes (Addendum)
PROGRESS NOTE  [name] [id number] DOB: 07-28-1986 DOA: 09/14/2019 PCP: [facility]  Brief History   33 year old woman PMH end-stage renal disease, recently diagnosed with calciphylaxis, developed foul odor from ulcers of the lower extremities the last several days as well as sharp pain.  Status post full dialysis session 11/28.  Admitted for bilateral lower extremity infected ulcer secondary to suspect calciphylaxis in context of ESRD on hemodialysis.  A & P  Bilateral lower extremity infected ulcers secondary to confirmed calciphylaxis in the context of ESRD on hemodialysis.  Sepsis considered on admission but lactic acid within normal limits and no evidence of new endorgan dysfunction. Status post debridement, wound VAC placement and formal biopsy to confirm calciphylaxis diagnosis 11/29. --Blood cultures pending.  No wound culture sent.  Continue empiric antibiotics per infectious disease.  ESRD on hemodialysis Tuesday, Thursday, Saturday with associated anemia of CKD. --Continue management per nephrology, next hemodialysis will be Thursday. --Hemoglobin slightly lower today.  Transfused 1 unit PRBC with dialysis today.  Diabetes mellitus type 2.  Last hemoglobin A1c 5.1. --Remains diet controlled.  Chronic diastolic CHF --Appears stable, continue carvedilol and torsemide  Essential hypertension --Labile, high today.  Continue amlodipine, carvedilol, clonidine, losartan  Morbid obesity with Body mass index is 48.68 kg/m.  --Dietitian consultation  Continue wound VAC changes per general surgery, antibiotics per infectious disease, nephrology care per nephrology.  Anticipate discharge when cleared by surgery and infectious disease.  DVT prophylaxis: heparin Code Status: Full Family Communication: none Disposition Plan: home    Murray Hodgkins, MD  [facility] Direct contact: see [email or web address] (further directions at bottom of note if needed)  7PM-7AM contact night coverage as at bottom of note 09/17/2019, 6:47 PM  LOS: 3 days   Significant Hospital Events   . 11/28 admitted for bilateral lower extremity infected wounds, suspected calciphylaxis . 11/28 General surgery consultation . 11/29 . 12/1 transfusion 1 unit PRBC . 12/1 vascular surgery consultation for complication of dialysis device.  Insertion of tunneled dialysis catheter right IJ.   Consults:  . General surgery . Nephrology . Vascular surgery   Procedures:  . 11/29 debridement of bilateral lower extremity wounds and wound VAC placement . 12/1 insertion of tunneled dialysis catheter right IJ  Significant Diagnostic Tests:  . 11/28 EKG sinus rhythm, lateral T wave inversion, prolonged QT, new compared to previous study 07/02/2019 . Biopsy pathology confirmed calciphylaxis   Micro Data:  . 11/28 SARS-CoV-2 negative . 11/28 blood cultures   Antimicrobials:  . Ceftriaxone 11/28  . Cefepime 11/28 > . Metronidazole 11/28 . Vancomycin 11/28 >   Interval History/Subjective  Feels okay today.  Some nausea.  Breathing okay.  Objective   Vitals:  Vitals:   09/17/19 1705 09/17/19 1725  BP: (!) 191/105 (!) 206/107  Pulse: 94 98  Resp: 18   Temp:    SpO2: 99% 91%    Exam:  Constitutional.  Appears calm, comfortable. Respiratory.  Clear to auscultation bilaterally.  No wheezes, rales or rhonchi.  Normal respiratory effort. Cardiovascular.  Regular rate and rhythm.  No murmur, rub or gallop. Wound vacs in place.  I have personally reviewed the following:   Today's Data  . Hemoglobin 6.8 > 7.1 after transfusion . BMP consistent with end-stage renal disease.  Potassium 5.1.  Scheduled Meds: . sodium chloride   Intravenous Once  . amLODipine  10 mg Oral Daily  . carvedilol  25 mg Oral BID WC  . Chlorhexidine Gluconate Cloth  6 each Topical  Daily  . cloNIDine  0.1 mg Oral TID  . feeding supplement (NEPRO CARB STEADY)  237 mL Oral TID BM  . fentaNYL       . fentaNYL      . ferric citrate  420 mg Oral TID  . gabapentin  100 mg Oral QHS  . isosorbide mononitrate  30 mg Oral BID  . labetalol      . losartan  100 mg Oral QPM  . midazolam      . multivitamin  1 tablet Oral QHS  . torsemide  40 mg Oral Daily  . vitamin C  500 mg Oral BID   Continuous Infusions: . sodium chloride 250 mL (09/16/19 2227)  . ceFEPime (MAXIPIME) IV 1 g (09/16/19 2228)  . metronidazole 500 mg (09/17/19 1800)  . sodium thiosulfate infusion for calciphylaxis Stopped (09/17/19 1401)  . vancomycin    . vancomycin      Active Problems:   Anemia in chronic kidney disease   Infection of anterior lower leg   Calciphylaxis   ESRD (end stage renal disease) (HCC)   Chronic diastolic CHF (congestive heart failure) (HCC)   Obesity, Class III, BMI 40-49.9 (morbid obesity) ([place])   LOS: 3 days   How to contact the [facility] Attending or Consulting provider 7A - 7P or covering provider during after hours [place], for this patient?  1. Check the care team in [facility] and look for a) attending/consulting TR[facility] provider listed and b) the [facility] team listed 2. Log into [email or web address] and use [place] universal password to access. If you do not have the password, please contact the hospital operator. 3. Locate the [facility] provider you are looking for under [facility] and page to a number that you can be directly reached. 4. If you still have difficulty reaching the provider, please page the [facility] (Director on Call) for the Hospitalists listed on amion for assistance.     '

## 2019-09-17 NOTE — Progress Notes (Signed)
This note also relates to the following rows which could not be included: Pulse Rate - Cannot attach notes to unvalidated device data BP - Cannot attach notes to unvalidated device data SpO2 - Cannot attach notes to unvalidated device data  STABLE FOR D/C NO C/OS UFG 3L ACHEIVED

## 2019-09-17 NOTE — Progress Notes (Signed)
CVC evaluated by MD ordered catheter exchange after tx today

## 2019-09-17 NOTE — Progress Notes (Signed)
[facility]  ROUNDING NOTE   Subjective:   Seen and examined on hemodialysis treatment. Tolerating treatment well. No complaints.   Objective:  Vital signs in last 24 hours:  Temp:  [98.7 F (37.1 C)-99.7 F (37.6 C)] 98.7 F (37.1 C) (12/01 0513) Pulse Rate:  [94-99] 94 (12/01 0513) Resp:  [18-20] 20 (12/01 0513) BP: (128-148)/(79-93) 144/93 (12/01 0513) SpO2:  [87 %-98 %] 93 % (12/01 0513)  Weight change:  Filed Weights   09/14/19 1447 09/14/19 2151  Weight: 127 kg 131.4 kg    Intake/Output: I/O last 3 completed shifts: In: 456.1 [P.O.:240; I.V.:15.8; IV Piggyback:200.3] Out: 750 [Urine:750]   Intake/Output this shift:  Total I/O In: 240 [P.O.:240] Out: -   Physical Exam: General: NAD,   Head: Normocephalic, atraumatic. Moist oral mucosal membranes  Eyes: Anicteric, PERRL  Neck: Supple, trachea midline  Lungs:  Clear to auscultation  Heart: Regular rate and rhythm  Abdomen:  Soft, nontender,   Extremities:  bilateral wounds in wound vac bilaterally  Neurologic: Nonfocal, moving all four extremities  Skin: No lesions  Access: PD catheter RIJ permcath: cuff is visible. No drainage.       Basic Metabolic Panel: Recent Labs  Lab 09/14/19 1459 09/15/19 0434 09/15/19 1135 09/16/19 0415 09/17/19 0614  NA 138 140  --  139 139  K 3.4* 4.0  --  5.0 5.1  CL 103 106  --  105 107  CO2 21* 19*  --  19* 19*  GLUCOSE 124* 97  --  98 92  BUN 57* 64*  --  73* 79*  CREATININE 8.91* 9.58* 10.26* 11.00* 12.92*  CALCIUM 8.0* 8.1*  --  7.8* 7.8*  PHOS  --   --   --   --  9.9*    Liver Function Tests: Recent Labs  Lab 09/14/19 1459 09/17/19 0614  AST 14*  --   ALT 16  --   ALKPHOS 81  --   BILITOT 0.8  --   PROT 7.4  --   ALBUMIN 3.0* 2.8*   No results for input(s): LIPASE, AMYLASE in the last 168 hours. No results for input(s): AMMONIA in the last 168 hours.  CBC: Recent Labs  Lab 09/14/19 1459 09/15/19 0434 09/15/19 1135 09/16/19  0415 09/17/19 0614 09/17/19 0939  WBC 15.1* 15.0* 14.9* 13.0* 8.6 7.8  NEUTROABS 11.9*  --   --   --   --   --   HGB 8.4* 7.0* 7.6* 7.2* 6.8* 7.1*  HCT 25.5* 22.6* 24.0* 22.9* 21.4* 23.0*  MCV 90.1 94.6 93.4 92.7 93.0 96.6  PLT 288 261 289 295 279 291    Cardiac Enzymes: No results for input(s): CKTOTAL, CKMB, CKMBINDEX, TROPONINI in the last 168 hours.  BNP: Invalid input(s): POCBNP  CBG: Recent Labs  Lab 09/15/19 1024  GLUCAP 90    Microbiology: Results for orders placed or performed during the hospital encounter of 09/14/19  SARS CORONAVIRUS 2 (TAT 6-24 HRS) Nasopharyngeal Nasopharyngeal Swab     Status: None   Collection Time: 09/14/19  5:52 PM   Specimen: Nasopharyngeal Swab  Result Value Ref Range Status   SARS Coronavirus 2 NEGATIVE NEGATIVE Final    Comment: (NOTE) SARS-CoV-2 target nucleic acids are NOT DETECTED. The SARS-CoV-2 RNA is generally detectable in upper and lower respiratory specimens during the acute phase of infection. Negative results do not preclude SARS-CoV-2 infection, do not rule out co-infections with other pathogens, and should not be used as the sole basis  for treatment or other patient management decisions. Negative results must be combined with clinical observations, patient history, and epidemiological information. The expected result is Negative. Fact Sheet for Patients: SugarRoll.be Fact Sheet for Healthcare Providers: https://www.woods-mathews.com/ This test is not yet approved or cleared by the [place] FDA and  has been authorized for detection and/or diagnosis of SARS-CoV-2 by FDA under an Emergency Use Authorization (EUA). This EUA will remain  in effect (meaning this test can be used) for the duration of the COVID-19 declaration under Section 56 4(b)(1) of the Act, 21 U.S.C. section 360bbb-3(b)(1), unless the authorization is terminated or revoked sooner. Performed at [facility], [place] [address] [place], [place] [address]   Blood Culture (routine x 2)     Status: None (Preliminary result)   Collection Time: 09/14/19  6:26 PM   Specimen: BLOOD  Result Value Ref Range Status   Specimen Description BLOOD RIGHT ANTECUBITAL  Final   Special Requests   Final    BOTTLES DRAWN AEROBIC AND ANAEROBIC Blood Culture adequate volume   Culture   Final    NO GROWTH 3 DAYS Performed at [facility], [address] [place], [place] [address]    Report Status PENDING  Incomplete  Blood Culture (routine x 2)     Status: None (Preliminary result)   Collection Time: 09/14/19  6:26 PM   Specimen: BLOOD  Result Value Ref Range Status   Specimen Description BLOOD RIGHT ANTECUBITAL  Final   Special Requests   Final    BOTTLES DRAWN AEROBIC AND ANAEROBIC Blood Culture adequate volume   Culture   Final    NO GROWTH 3 DAYS Performed at [facility], [address] [place], [place] [address]    Report Status PENDING  Incomplete    Coagulation Studies: No results for input(s): LABPROT, INR in the last 72 hours.  Urinalysis: No results for input(s): COLORURINE, LABSPEC, PHURINE, GLUCOSEU, HGBUR, BILIRUBINUR, KETONESUR, PROTEINUR, UROBILINOGEN, NITRITE, LEUKOCYTESUR in the last 72 hours.  Invalid input(s): APPERANCEUR    Imaging: No results found.   Medications:   . sodium chloride 250 mL (09/16/19 2227)  . ceFEPime (MAXIPIME) IV 1 g (09/16/19 2228)  . metronidazole 500 mg (09/17/19 0215)  . sodium thiosulfate infusion for calciphylaxis    . vancomycin    . vancomycin     . sodium chloride   Intravenous Once  . amLODipine  10 mg Oral Daily  . carvedilol  25 mg Oral BID WC  . Chlorhexidine Gluconate Cloth  6 each Topical Daily  . cloNIDine  0.1 mg Oral TID  . epoetin (EPOGEN/PROCRIT) injection  10,000 Units Intravenous Q T,Th,Sa-HD  . feeding supplement (NEPRO CARB STEADY)  237 mL Oral TID BM  . ferric citrate  420 mg Oral TID  .  gabapentin  100 mg Oral QHS  . heparin  5,000 Units Subcutaneous Q8H  . isosorbide mononitrate  30 mg Oral BID  . losartan  100 mg Oral QPM  . multivitamin  1 tablet Oral QHS  . torsemide  40 mg Oral Daily  . vitamin C  500 mg Oral BID   sodium chloride, acetaminophen **OR** [DISCONTINUED] acetaminophen, albuterol, ondansetron **OR** ondansetron (ZOFRAN) IV, oxyCODONE  Assessment/ Plan:  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 End Stage renal disease:  Hemodialysis for today Complication of dialysis device: consult vascular for permcath exchange Flush peritoneal dialysis catheter weekly, scheduled for today.   2. Hypertension:  - losartan, torsemide, carvedilol, amlodipine, clonidine, and isosorbide mononitrate.   3. Anemia of chronic kidney disease: Hemoglobin 6.8, 7.1 on repeat - EPO with HD treatment - PRBC transfusion ordered.   4. Secondary Hyperparathyroidism: with calciphylaxis - ferric citrate - Sodium thiosulfate.   5. Bilateral lower extremity wounds - wound vac placed by Dr. Lysle Pearl. Appreciate input from surgery and Infectious disease - empiric vancomycin, cefepime and metronidazole.    LOS: 3 Antjuan Rothe 12/1/202011:08 AM

## 2019-09-17 NOTE — Progress Notes (Signed)
   09/17/19 1032  Neurological  Level of Consciousness Alert  Orientation Level Oriented X4  Respiratory  Respiratory Pattern Regular;Unlabored  Bilateral Breath Sounds Clear;Diminished  Cough Non-productive  Cardiac  Pulse Regular  Heart Sounds S1, S2  ECG Monitor Yes  pt stable for HD TX

## 2019-09-17 NOTE — OR Nursing (Signed)
Slight burning left leg, denies nausea

## 2019-09-17 NOTE — Progress Notes (Signed)
PT VOMITING PRN MEDICATION REQUESTED FROM PRIMARY RN

## 2019-09-17 NOTE — Consult Note (Signed)
[facility] Admission History & Physical  MRN : [id number]  [name] is a 33 y.o. ([birth year]) female who presents with chief complaint of:   Chief Complaint  Patient presents with  . Shortness of Breath  . Leg Pain    bilateral   History of Present Illness:  The patient is a 33 year old female with multiple medical issues (see below) who presented to the hospital for bilateral leg pain shortness of breath.  The patient has a known past medical history of end-stage renal disease on chronic hemodialysis.  The patient is currently being maintained by a PermCath (Tuesdays, Thursdays, Saturdays). Even though the patient's PermCath is functioning appropriately, dialysis staff have noticed that the cuff is exposed.  Vascular surgery was consulted by Dr. Juleen China for possible exchange.  Denies any fever, nausea, or vomiting.  She denies any uremic symptoms.  She has been tolerating dialysis treatments well through her current PermCath and she is without complaint.  05/21/19: Insertion of peritoneal dialysis catheter / permcath by Dr. Raul Del ([facility]). Patient states she hasn't started to use her PD catheter as she still needs to receive training.   Current Facility-Administered Medications  Medication Dose Route Frequency Provider Last Rate Last Dose  . [MAR Hold] 0.9 %  sodium chloride infusion (Manually program via Guardrails IV Fluids)   Intravenous Once Kolluru, Lurena Nida, MD      . Doug Sou Hold] 0.9 %  sodium chloride infusion   Intravenous PRN Samuella Cota, MD 10 mL/hr at 09/16/19 2227 250 mL at 09/16/19 2227  . 0.9 %  sodium chloride infusion   Intravenous Continuous Tyreik Delahoussaye A, PA-C 10 mL/hr at 09/17/19 1519    . [MAR Hold] acetaminophen (TYLENOL) tablet 650 mg  650 mg Oral Q6H PRN Sakai, Isami, DO   650 mg at 09/17/19 1533  . [MAR Hold] albuterol (PROVENTIL) (2.5 MG/3ML) 0.083% nebulizer solution 2.5 mg  2.5 mg  Inhalation Q4H PRN Kolluru, Sarath, MD   2.5 mg at 09/16/19 1259  . [MAR Hold] amLODipine (NORVASC) tablet 10 mg  10 mg Oral Daily Loletha Grayer, MD   10 mg at 09/16/19 0852  . [MAR Hold] carvedilol (COREG) tablet 25 mg  25 mg Oral BID WC Loletha Grayer, MD   25 mg at 09/16/19 1747  . [START ON 09/18/2019] ceFAZolin (ANCEF) IVPB 1 g/50 mL premix  1 g Intravenous Once [place] A, PA-C      . [MAR Hold] ceFEPIme (MAXIPIME) 1 g in sodium chloride 0.9 % 100 mL IVPB  1 g Intravenous Q24H Rito Ehrlich A, RPH 200 mL/hr at 09/16/19 2228 1 g at 09/16/19 2228  . [MAR Hold] Chlorhexidine Gluconate Cloth 2 % PADS 6 each  6 each Topical Daily Samuella Cota, MD   6 each at 09/17/19 [phone]  . [MAR Hold] cloNIDine (CATAPRES) tablet 0.1 mg  0.1 mg Oral TID Loletha Grayer, MD   0.1 mg at 09/16/19 2213  . diphenhydrAMINE (BENADRYL) injection 50 mg  50 mg Intravenous Once PRN Garion Wempe A, PA-C      . famotidine (PEPCID) tablet 40 mg  40 mg Oral Once PRN Cadi Rhinehart, Janalyn Harder, PA-C      . [MAR Hold] feeding supplement (NEPRO CARB STEADY) liquid 237 mL  237 mL Oral TID BM Samuella Cota, MD   237 mL at 09/16/19 1355  . fentaNYL (SUBLIMAZE) 100 MCG/2ML injection           . [  MAR Hold] ferric citrate (AURYXIA) tablet 420 mg  420 mg Oral TID Loletha Grayer, MD   420 mg at 09/17/19 0842  . [MAR Hold] gabapentin (NEURONTIN) capsule 100 mg  100 mg Oral QHS Loletha Grayer, MD   100 mg at 09/16/19 2213  . [MAR Hold] HYDROmorphone (DILAUDID) injection 1 mg  1 mg Intravenous Once PRN Hendrik Donath, Janalyn Harder, PA-C      . [MAR Hold] isosorbide mononitrate (IMDUR) 24 hr tablet 30 mg  30 mg Oral BID Loletha Grayer, MD   30 mg at 09/16/19 2213  . [MAR Hold] losartan (COZAAR) tablet 100 mg  100 mg Oral QPM Loletha Grayer, MD   100 mg at 09/16/19 1747  . methylPREDNISolone sodium succinate (SOLU-MEDROL) 125 mg/2 mL injection 125 mg  125 mg Intravenous Once PRN Rashawd Laskaris, Janalyn Harder, PA-C      .  [MAR Hold] metoCLOPramide (REGLAN) 5 MG/5ML solution 10 mg  10 mg Oral Q6H PRN Leeta Grimme A, PA-C   10 mg at 09/17/19 1534  . [MAR Hold] metroNIDAZOLE (FLAGYL) IVPB 500 mg  500 mg Intravenous Q8H Wieting, Richard, MD 100 mL/hr at 09/17/19 0215 500 mg at 09/17/19 0215  . midazolam (VERSED) 2 MG/ML syrup 8 mg  8 mg Oral Once PRN Barth Trella A, PA-C      . midazolam (VERSED) 5 MG/5ML injection           . [MAR Hold] multivitamin (RENA-VIT) tablet 1 tablet  1 tablet Oral QHS Samuella Cota, MD   1 tablet at 09/16/19 2213  . [MAR Hold] ondansetron (ZOFRAN) tablet 4 mg  4 mg Oral Q6H PRN Loletha Grayer, MD       Or  . Doug Sou Hold] ondansetron [facility]) injection 4 mg  4 mg Intravenous Q6H PRN Loletha Grayer, MD   4 mg at 09/17/19 1237  . [MAR Hold] ondansetron (ZOFRAN) injection 4 mg  4 mg Intravenous Q6H PRN Yobani Schertzer, Janalyn Harder, PA-C      . [MAR Hold] oxyCODONE (Oxy IR/ROXICODONE) immediate release tablet 5 mg  5 mg Oral Q4H PRN Loletha Grayer, MD   5 mg at 09/15/19 2317  . [MAR Hold] sodium thiosulfate 25 g in sodium chloride 0.9 % 200 mL Infusion for Calciphylaxis  25 g Intravenous Once per day on Tue Thu Sat Lavonia Dana, MD   Stopped at 09/17/19 1401  . [MAR Hold] torsemide (DEMADEX) tablet 40 mg  40 mg Oral Daily Loletha Grayer, MD   40 mg at 09/16/19 0851  . [MAR Hold] vancomycin (VANCOCIN) IVPB 1000 mg/200 mL premix  1,000 mg Intravenous Q T,Th,Sa-HD Rito Ehrlich A, RPH      . [MAR Hold] vancomycin (VANCOCIN) IVPB 1000 mg/200 mL premix  1,000 mg Intravenous Once Rito Ehrlich A, RPH      . [MAR Hold] vitamin C (ASCORBIC ACID) tablet 500 mg  500 mg Oral BID Samuella Cota, MD   500 mg at 09/17/19 [id number]   Past Medical History:  Diagnosis Date  . Anemia of chronic disease   . Asthma   . CHF (congestive heart failure) ([place])   . CKD (chronic kidney disease), stage III   . Diabetes ([place])    type 2  . Hypertension   . Morbid obesity ([place])    Past Surgical  History:  Procedure Laterality Date  . APPLICATION OF WOUND VAC Bilateral 09/15/2019   Procedure: APPLICATION OF WOUND VAC;  Surgeon: Benjamine Sprague, DO;  Location: [facility] ORS;  Service: General;  Laterality:  Bilateral;  . DIALYSIS/PERMA CATHETER INSERTION N/A 02/28/2019   Procedure: DIALYSIS/PERMA CATHETER INSERTION;  Surgeon: Algernon Huxley, MD;  Location: [facility];  Service: Cardiovascular;  Laterality: N/A;  . pd cath    . TONSILLECTOMY    . WOUND DEBRIDEMENT Bilateral 09/15/2019   Procedure: DEBRIDEMENT WOUND;  Surgeon: Benjamine Sprague, DO;  Location: [facility] ORS;  Service: General;  Laterality: Bilateral;   Social History Social History   Tobacco Use  . Smoking status: Former Smoker    Types: Cigars  . Smokeless tobacco: Never Used  Substance Use Topics  . Alcohol use: No    Alcohol/week: 0.0 standard drinks  . Drug use: No   Family History Family History  Problem Relation Age of Onset  . Hypertension Mother   . Multiple sclerosis Mother   . CAD Mother   . Cancer Maternal Grandmother        Ovarian   . Cancer Maternal Grandfather        Prostate   . Kidney disease Father   No family history of bleeding or clotting disorders, autoimmune disease or porphyria.  No Known Allergies  REVIEW OF SYSTEMS (Negative unless checked)  Constitutional: [] Weight loss  [] Fever  [] Chills Cardiac: [] Chest pain   [] Chest pressure   [] Palpitations   [] Shortness of breath when laying flat   [] Shortness of breath at rest   [x] Shortness of breath with exertion. Vascular:  [x] Pain in legs with walking   [x] Pain in legs at rest   [x] Pain in legs when laying flat   [] Claudication   [] Pain in feet when walking  [] Pain in feet at rest  [] Pain in feet when laying flat   [] History of DVT   [] Phlebitis   [] Swelling in legs   [] Varicose veins   [] Non-healing ulcers Pulmonary:   [] Uses home oxygen   [] Productive cough   [] Hemoptysis   [] Wheeze  [] COPD   [] Asthma Neurologic:  [] Dizziness  [] Blackouts    [] Seizures   [] History of stroke   [] History of TIA  [] Aphasia   [] Temporary blindness   [] Dysphagia   [] Weakness or numbness in arms   [] Weakness or numbness in legs Musculoskeletal:  [] Arthritis   [] Joint swelling   [] Joint pain   [] Low back pain Hematologic:  [] Easy bruising  [] Easy bleeding   [] Hypercoagulable state   [x] Anemic  [] Hepatitis Gastrointestinal:  [] Blood in stool   [] Vomiting blood  [] Gastroesophageal reflux/heartburn   [] Difficulty swallowing. Genitourinary:  [x] Chronic kidney disease   [] Difficult urination  [] Frequent urination  [] Burning with urination   [] Blood in urine Skin:  [] Rashes   [] Ulcers   [] Wounds Psychological:  [] History of anxiety   []  History of major depression.  Physical Examination  Vitals:   09/17/19 1400 09/17/19 1415 09/17/19 1420 09/17/19 1508  BP: (!) 192/91 (!) 210/105  (!) 189/99  Pulse: 92 97 95 (!) 102  Resp: (!) 25 15 15 17   Temp:    99 F (37.2 C)  TempSrc:    Oral  SpO2: 98% 100% 100% 98%  Weight:      Height:       Body mass index is 48.68 kg/m. Gen: WD/WN, NAD Head: [place]/AT, No temporalis wasting. Prominent temp pulse not noted. Ear/Nose/Throat: Hearing grossly intact, nares w/o erythema or drainage, oropharynx w/o Erythema/Exudate,  Eyes: Conjunctiva clear, sclera non-icteric Neck: Trachea midline.  No JVD.  Pulmonary:  Good air movement, respirations not labored, no use of accessory muscles.  Chest:   Permcath: with exposed cuff.  No signs of infection noted. No drainage, erythema.    Document Information Photos    09/17/2019 10:39  Attached To:  Hospital Encounter on 09/14/19  Source Information Kolluru, Lurena Nida, MD  [facility]   Cardiac: RRR, normal S1, S2. Vascular:  Vessel Right Left  Radial Palpable Palpable  Ulnar Not Palpable Not Palpable  Brachial Palpable Palpable  Carotid Palpable, without bruit Palpable, without bruit  Gastrointestinal: soft, non-tender/non-distended. No guarding/reflex.    Peritoneal Dialysis Catheter: intact, clean and dry. No signs of infection.  Musculoskeletal: M/S 5/5 throughout.  Extremities without ischemic changes.  No deformity or atrophy.  Neurologic: Sensation grossly intact in extremities.  Symmetrical.  Speech is fluent. Motor exam as listed above. Psychiatric: Judgment intact, Mood & affect appropriate for pt's clinical situation. Dermatologic: VAC intact. To suction.  Lymph : No Cervical, Axillary, or Inguinal lymphadenopathy.  CBC Lab Results  Component Value Date   WBC 7.8 09/17/2019   HGB 7.1 (L) 09/17/2019   HCT 23.0 (L) 09/17/2019   MCV 96.6 09/17/2019   PLT 291 09/17/2019   BMET    Component Value Date/Time   NA 139 09/17/2019 0614   NA 135 (L) 12/31/2013 1020   K 5.1 09/17/2019 0614   K 3.9 12/31/2013 1020   CL 107 09/17/2019 0614   CL 105 12/31/2013 1020   CO2 19 (L) 09/17/2019 0614   CO2 26 12/31/2013 1020   GLUCOSE 92 09/17/2019 0614   GLUCOSE 243 (H) 12/31/2013 1020   BUN 79 (H) 09/17/2019 0614   BUN 19 (H) 12/31/2013 1020   CREATININE 12.92 (H) 09/17/2019 0614   CREATININE 2.12 (H) 12/31/2013 1020   CALCIUM 7.8 (L) 09/17/2019 0614   CALCIUM 8.5 12/31/2013 1020   GFRNONAA 3 (L) 09/17/2019 0614   GFRNONAA 31 (L) 12/31/2013 1020   GFRAA 4 (L) 09/17/2019 0614   GFRAA 36 (L) 12/31/2013 1020   Estimated Creatinine Clearance: 8.7 mL/min (A) (by C-G formula based on SCr of 12.92 mg/dL (H)).  COAG Lab Results  Component Value Date   INR 1.1 07/02/2019   INR 1.16 02/12/2016   Radiology No results found.  Assessment/Plan The patient is a 33 year old female with multiple medical issues including end-stage renal disease currently being maintained by a PermCath on hemodialysis  1.  Complication dialysis device: The patient is currently being maintained by a PermCath however the tunneled cuff is not exposed.  In this setting, the patient is at a increased risk for infection/sepsis.  Recommend exchanging the PermCath.   Procedure, risks and benefits explained to the patient.  All questions answered.  The patient wishes to proceed.  2.  End-stage renal disease requiring hemodialysis:  Patient will continue dialysis therapy via her permcath until she has been trained on how to work her PD catheter  3.  Hypertension:  Patient will continue medical management; nephrology is  following no changes in oral medications.  5.  Acute on Chronic Anemia: Patient will be receiving one unit packed red blood cells today.  Continue to trend CBC.   Discussed with Dr. Francene Castle, PA-C  09/17/2019 3:34 PM

## 2019-09-17 NOTE — Progress Notes (Signed)
This note also relates to the following rows which could not be included: Pulse Rate - Cannot attach notes to unvalidated device data ECG Heart Rate - Cannot attach notes to unvalidated device data Resp - Cannot attach notes to unvalidated device data BP - Cannot attach notes to unvalidated device data SpO2 - Cannot attach notes to unvalidated device data  PRBCs TX COMPLETE VITALS STABLE PT STABLE

## 2019-09-18 ENCOUNTER — Encounter: Payer: Self-pay | Admitting: Vascular Surgery

## 2019-09-18 LAB — TYPE AND SCREEN
ABO/RH(D): A POS
Antibody Screen: NEGATIVE
Unit division: 0

## 2019-09-18 LAB — BPAM RBC
Blood Product Expiration Date: 202012262359
ISSUE DATE / TIME: 202012011110
Unit Type and Rh: 6200

## 2019-09-18 LAB — PREPARE RBC (CROSSMATCH)

## 2019-09-18 MED ORDER — VANCOMYCIN HCL IN DEXTROSE 1-5 GM/200ML-% IV SOLN
1000.0000 mg | Freq: Once | INTRAVENOUS | Status: AC
  Administered 2019-09-18: 1000 mg via INTRAVENOUS
  Filled 2019-09-18: qty 200

## 2019-09-18 MED ORDER — ASCORBIC ACID 500 MG PO TABS: 500.0000 mg | ORAL_TABLET | Freq: Two times a day (BID) | ORAL | 1 refills | Status: DC

## 2019-09-18 MED ORDER — ISOSORBIDE MONONITRATE ER 30 MG PO TB24: 30.0000 mg | ORAL_TABLET | Freq: Two times a day (BID) | ORAL | 2 refills | Status: DC

## 2019-09-18 MED ORDER — RENA-VITE PO TABS: 1.0000 | ORAL_TABLET | Freq: Every day | ORAL | 0 refills | Status: AC

## 2019-09-18 NOTE — Discharge Summary (Signed)
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 Anemia of chronic disease   . Asthma   . CHF (congestive heart failure) ([place])   . CKD (chronic kidney disease), stage III   . Diabetes ([place])    type 2  . Hypertension   . Morbid obesity [facility])     HOSPITAL COURSE:  33 year old woman PMH end-stage renal disease, recently diagnosed with calciphylaxis, developed foul odor from ulcers of the lower extremities the last several days as well as sharp pain.  Bilateral lower extremity infected ulcers secondary to confirmed calciphylaxis in the context of ESRD on hemodialysis. -Sepsis considered on admission but lactic acid within normal limits and no evidence of new endorgan dysfunction. - Status post debridement, wound VAC placement and formal biopsy to confirm calciphylaxis diagnosis 11/29 by Dr Lysle Pearl -patient had wound VAC dressing changed on first December. Next wound VAC dressing will be changed or 4 December and there after Monday Wednesday Friday by home health nurses --Blood cultures negative  No wound culture sent.   -Continue empiric antibiotics per infectious disease-- patient will get total of two weeks antibiotics at dialysis. Dr. Juleen China aware and will order vancomycin and Ceftazidime to be given at dialysis  ESRD on  hemodialysis Tuesday, Thursday, Saturday with associated anemia of CKD. --Continue management per nephrology --Hemoglobin slightly lower today.  Transfused 1 unit PRBC with dialysis yesterday  Diabetes mellitus type 2.  Last hemoglobin A1c 5.1. --Remains diet controlled.  Chronic diastolic CHF --Appears stable, continue carvedilol and torsemide  Essential hypertension --Labile, high today.  Continue amlodipine, carvedilol, clonidine, losartan  Morbid obesity with Body mass index is 48.68 kg/m.  --Dietitian consultation  Continue wound VAC changes per general surgery, antibiotics per infectious disease, nephrology care per nephrology.   Patient will be discharged to home with mother. Home health RN to do wound VAC dressing changes per recommendation by wound nurse.   DVT prophylaxis: heparin Code Status: Full Family Communication:  with mother in the room Disposition Plan: home   CONSULTS OBTAINED:  Treatment Team:  Benjamine Sprague, DO  DRUG ALLERGIES:  No Known Allergies  DISCHARGE MEDICATIONS:   Allergies as of 09/18/2019   No Known Allergies     Medication List    TAKE these medications   amLODipine 10 MG tablet Commonly known as: NORVASC Take 10 mg by mouth daily.   ascorbic acid 500 MG tablet Commonly known as: VITAMIN C Take 1 tablet (500 mg total) by mouth 2 (two) times daily.   aspirin 81 MG EC tablet Take 1 tablet (81 mg total) by mouth daily.   Auryxia 1 GM 210 MG(Fe) tablet Generic drug: ferric citrate Take 420 mg by mouth 3 (three) times daily.   carvedilol 25 MG tablet Commonly known as: COREG Take 1 tablet (25 mg total) by mouth 2 (two) times daily with a meal.   cloNIDine 0.1 MG  tablet Commonly known as: CATAPRES Take 0.1 mg by mouth 3 (three) times daily.   gabapentin 100 MG capsule Commonly known as: NEURONTIN Take 100 mg by mouth at bedtime.   isosorbide mononitrate 30 MG 24 hr tablet Commonly known as: IMDUR Take 1 tablet (30  mg total) by mouth 2 (two) times daily.   losartan 100 MG tablet Commonly known as: COZAAR Take 100 mg by mouth every evening.   multivitamin Tabs tablet Take 1 tablet by mouth at bedtime.   torsemide 20 MG tablet Commonly known as: DEMADEX Take 40 mg by mouth daily.       If you experience worsening of your admission symptoms, develop shortness of breath, life threatening emergency, suicidal or homicidal thoughts you must seek medical attention immediately by calling 911 or calling your MD immediately  if symptoms less severe.  You Must read complete instructions/literature along with all the possible adverse reactions/side effects for all the Medicines you take and that have been prescribed to you. Take any new Medicines after you have completely understood and accept all the possible adverse reactions/side effects.   Please note  You were cared for by a hospitalist during your hospital stay. If you have any questions about your discharge medications or the care you received while you were in the hospital after you are discharged, you can call the unit and asked to speak with the hospitalist on call if the hospitalist that took care of you is not available. Once you are discharged, your primary care physician will handle any further medical issues. Please note that NO REFILLS for any discharge medications will be authorized once you are discharged, as it is imperative that you return to your primary care physician (or establish a relationship with a primary care physician if you do not have one) for your aftercare needs so that they can reassess your need for medications and monitor your lab values. Today   SUBJECTIVE   Feels good. Eating lunch. Wants to go home.  VITAL SIGNS:  Blood pressure 129/76, pulse 80, temperature 98.5 F (36.9 C), temperature source Oral, resp. rate 20, height 5\' 6"  (1.676 m), weight (!) 136.8 kg, last menstrual period 09/04/2019, SpO2 92 %.  I/O:     Intake/Output Summary (Last 24 hours) at 09/18/2019 1419 Last data filed at 09/18/2019 [id number] Gross per 24 hour  Intake 1085.14 ml  Output 3000 ml  Net -1914.86 ml    PHYSICAL EXAMINATION:  GENERAL:  33 y.o.-year-old patient lying in the bed with no acute distress. Obese EYES: Pupils equal, round, reactive to light and accommodation. No scleral icterus. Extraocular muscles intact.  HEENT: Head atraumatic, normocephalic. Oropharynx and nasopharynx clear.  NECK:  Supple, no jugular venous distention. No thyroid enlargement, no tenderness.  LUNGS: Normal breath sounds bilaterally, no wheezing, rales,rhonchi or crepitation. No use of accessory muscles of respiration.  CARDIOVASCULAR: S1, S2 normal. No murmurs, rubs, or gallops.  ABDOMEN: Soft, non-tender, non-distended. Bowel sounds present. No organomegaly or mass.  EXTREMITIES: No pedal edema, cyanosis, or clubbing. Bilateral lower extremity wound vac present NEUROLOGIC: Cranial nerves II through XII are intact. Muscle strength 5/5 in all extremities. Sensation intact. Gait not checked.  PSYCHIATRIC: The patient is alert and oriented x 3.  SKIN:as above  DATA REVIEW:   CBC  Recent Labs  Lab 09/17/19 0939  WBC 7.8  HGB 7.1*  HCT 23.0*  PLT 291    Chemistries  Recent Labs  Lab 09/14/19 1459  09/17/19 0614  NA  138   < > 139  K 3.4*   < > 5.1  CL 103   < > 107  CO2 21*   < > 19*  GLUCOSE 124*   < > 92  BUN 57*   < > 79*  CREATININE 8.91*   < > 12.92*  CALCIUM 8.0*   < > 7.8*  AST 14*  --   --   ALT 16  --   --   ALKPHOS 81  --   --   BILITOT 0.8  --   --    < > = values in this interval not displayed.    Microbiology Results   Recent Results (from the past 240 hour(s))  SARS CORONAVIRUS 2 (TAT 6-24 HRS) Nasopharyngeal Nasopharyngeal Swab     Status: None   Collection Time: 09/14/19  5:52 PM   Specimen: Nasopharyngeal Swab  Result Value Ref Range Status   SARS Coronavirus 2 NEGATIVE NEGATIVE Final    Comment:  (NOTE) SARS-CoV-2 target nucleic acids are NOT DETECTED. The SARS-CoV-2 RNA is generally detectable in upper and lower respiratory specimens during the acute phase of infection. Negative results do not preclude SARS-CoV-2 infection, do not rule out co-infections with other pathogens, and should not be used as the sole basis for treatment or other patient management decisions. Negative results must be combined with clinical observations, patient history, and epidemiological information. The expected result is Negative. Fact Sheet for Patients: SugarRoll.be Fact Sheet for Healthcare Providers: https://www.woods-mathews.com/ This test is not yet approved or cleared by the [place] FDA and  has been authorized for detection and/or diagnosis of SARS-CoV-2 by FDA under an Emergency Use Authorization (EUA). This EUA will remain  in effect (meaning this test can be used) for the duration of the COVID-19 declaration under Section 56 4(b)(1) of the Act, 21 U.S.C. section 360bbb-3(b)(1), unless the authorization is terminated or revoked sooner. Performed at [facility], [place] [address], [place], [place] [address]   Blood Culture (routine x 2)     Status: None (Preliminary result)   Collection Time: 09/14/19  6:26 PM   Specimen: BLOOD  Result Value Ref Range Status   Specimen Description BLOOD RIGHT ANTECUBITAL  Final   Special Requests   Final    BOTTLES DRAWN AEROBIC AND ANAEROBIC Blood Culture adequate volume   Culture   Final    NO GROWTH 3 DAYS Performed at [facility], [address], [place], [place] [address]    Report Status PENDING  Incomplete  Blood Culture (routine x 2)     Status: None (Preliminary result)   Collection Time: 09/14/19  6:26 PM   Specimen: BLOOD  Result Value Ref Range Status   Specimen Description BLOOD RIGHT ANTECUBITAL  Final   Special Requests   Final    BOTTLES DRAWN AEROBIC AND ANAEROBIC  Blood Culture adequate volume   Culture   Final    NO GROWTH 3 DAYS Performed at [facility], [address] [place], [place] [address]    Report Status PENDING  Incomplete    RADIOLOGY:  No results found.   CODE STATUS:     Code Status Orders  (From admission, onward)         Start     Ordered   09/14/19 1819  Full code  Continuous     09/14/19 1819        Code Status History    Date Active Date Inactive Code Status  Order ID Comments User Context   07/02/2019 2245 07/10/2019 1753 Full Code [id number]  Lang Snow, NP ED   02/11/2016 0707 02/16/2016 1708 Full Code [id number]  Saundra Shelling, MD Inpatient   Advance Care Planning Activity     Family Discussion: discussed with mother Consults: ID, nephrology Dr. Juleen China, surgery Dr. Lysle Pearl   TOTAL TIME TAKING CARE OF THIS PATIENT: *40  minutes.    Fritzi Mandes M.D on 09/18/2019 at 2:19 PM  Between 7am to 6pm - Pager - [phone] After 6pm go to [email or web address] - password [email or web address]  [facility]    CC: Primary care physician; [place]

## 2019-09-18 NOTE — Progress Notes (Signed)
[facility]  ROUNDING NOTE   Subjective:   Hemodialysis treatment yesterday. PRBC transfusion  Yesterday.   Wound vac changed yesterday by Dr. Deedra Ehrich permcath exchanged yesterday by Dr. Delana Meyer  PD catheter flushed yesterday.   Objective:  Vital signs in last 24 hours:  Temp:  [98.5 F (36.9 C)-99.2 F (37.3 C)] 98.5 F (36.9 C) (12/02 1148) Pulse Rate:  [80-104] 80 (12/02 1148) Resp:  [14-28] 20 (12/02 1148) BP: (120-206)/(74-120) 129/76 (12/02 1148) SpO2:  [91 %-100 %] 92 % (12/02 1148)  Weight change:  Filed Weights   09/14/19 1447 09/14/19 2151 09/17/19 1045  Weight: 127 kg 131.4 kg (!) 136.8 kg    Intake/Output: I/O last 3 completed shifts: In: 1365.1 [P.O.:240; I.V.:172.1; Blood:280; IV Piggyback:673.1] Out: [id number] [Urine:450; Other:3000]   Intake/Output this shift:  Total I/O In: 240 [P.O.:240] Out: -   Physical Exam: General: NAD,   Head: Normocephalic, atraumatic. Moist oral mucosal membranes  Eyes: Anicteric, PERRL  Neck: Supple, trachea midline  Lungs:  Clear to auscultation  Heart: Regular rate and rhythm  Abdomen:  Soft, nontender,   Extremities:  bilateral wounds in wound vac bilaterally  Neurologic: Nonfocal, moving all four extremities  Skin: No lesions  Access: PD catheter RIJ permcath: cuff is visible. No drainage.     Basic Metabolic Panel: Recent Labs  Lab 09/14/19 1459 09/15/19 0434 09/15/19 1135 09/16/19 0415 09/17/19 0614  NA 138 140  --  139 139  K 3.4* 4.0  --  5.0 5.1  CL 103 106  --  105 107  CO2 21* 19*  --  19* 19*  GLUCOSE 124* 97  --  98 92  BUN 57* 64*  --  73* 79*  CREATININE 8.91* 9.58* 10.26* 11.00* 12.92*  CALCIUM 8.0* 8.1*  --  7.8* 7.8*  PHOS  --   --   --   --  9.9*    Liver Function Tests: Recent Labs  Lab 09/14/19 1459 09/17/19 0614  AST 14*  --   ALT 16  --   ALKPHOS 81  --   BILITOT 0.8  --   PROT 7.4  --   ALBUMIN 3.0* 2.8*   No results for input(s): LIPASE, AMYLASE in the  last 168 hours. No results for input(s): AMMONIA in the last 168 hours.  CBC: Recent Labs  Lab 09/14/19 1459 09/15/19 0434 09/15/19 1135 09/16/19 0415 09/17/19 0614 09/17/19 0939  WBC 15.1* 15.0* 14.9* 13.0* 8.6 7.8  NEUTROABS 11.9*  --   --   --   --   --   HGB 8.4* 7.0* 7.6* 7.2* 6.8* 7.1*  HCT 25.5* 22.6* 24.0* 22.9* 21.4* 23.0*  MCV 90.1 94.6 93.4 92.7 93.0 96.6  PLT 288 261 289 295 279 291    Cardiac Enzymes: No results for input(s): CKTOTAL, CKMB, CKMBINDEX, TROPONINI in the last 168 hours.  BNP: Invalid input(s): POCBNP  CBG: Recent Labs  Lab 09/15/19 1024  GLUCAP 90    Microbiology: Results for orders placed or performed during the hospital encounter of 09/14/19  SARS CORONAVIRUS 2 (TAT 6-24 HRS) Nasopharyngeal Nasopharyngeal Swab     Status: None   Collection Time: 09/14/19  5:52 PM   Specimen: Nasopharyngeal Swab  Result Value Ref Range Status   SARS Coronavirus 2 NEGATIVE NEGATIVE Final    Comment: (NOTE) SARS-CoV-2 target nucleic acids are NOT DETECTED. The SARS-CoV-2 RNA is generally detectable in upper and lower respiratory specimens during the acute phase of infection. Negative  results do not preclude SARS-CoV-2 infection, do not rule out co-infections with other pathogens, and should not be used as the sole basis for treatment or other patient management decisions. Negative results must be combined with clinical observations, patient history, and epidemiological information. The expected result is Negative. Fact Sheet for Patients: SugarRoll.be Fact Sheet for Healthcare Providers: https://www.woods-mathews.com/ This test is not yet approved or cleared by the [place] FDA and  has been authorized for detection and/or diagnosis of SARS-CoV-2 by FDA under an Emergency Use Authorization (EUA). This EUA will remain  in effect (meaning this test can be used) for the duration of the COVID-19 declaration  under Section 56 4(b)(1) of the Act, 21 U.S.C. section 360bbb-3(b)(1), unless the authorization is terminated or revoked sooner. Performed at [facility], [place] [address], [place], [place] [address]   Blood Culture (routine x 2)     Status: None (Preliminary result)   Collection Time: 09/14/19  6:26 PM   Specimen: BLOOD  Result Value Ref Range Status   Specimen Description BLOOD RIGHT ANTECUBITAL  Final   Special Requests   Final    BOTTLES DRAWN AEROBIC AND ANAEROBIC Blood Culture adequate volume   Culture   Final    NO GROWTH 3 DAYS Performed at [facility], [address], [place], [place] [address]    Report Status PENDING  Incomplete  Blood Culture (routine x 2)     Status: None (Preliminary result)   Collection Time: 09/14/19  6:26 PM   Specimen: BLOOD  Result Value Ref Range Status   Specimen Description BLOOD RIGHT ANTECUBITAL  Final   Special Requests   Final    BOTTLES DRAWN AEROBIC AND ANAEROBIC Blood Culture adequate volume   Culture   Final    NO GROWTH 3 DAYS Performed at [facility], [address] [place], [place] [address]    Report Status PENDING  Incomplete    Coagulation Studies: No results for input(s): LABPROT, INR in the last 72 hours.  Urinalysis: No results for input(s): COLORURINE, LABSPEC, PHURINE, GLUCOSEU, HGBUR, BILIRUBINUR, KETONESUR, PROTEINUR, UROBILINOGEN, NITRITE, LEUKOCYTESUR in the last 72 hours.  Invalid input(s): APPERANCEUR    Imaging: No results found.   Medications:   . sodium chloride 250 mL (09/17/19 2330)  . ceFEPime (MAXIPIME) IV 1 g (09/18/19 1421)  . sodium thiosulfate infusion for calciphylaxis Stopped (09/17/19 1401)  . vancomycin     . sodium chloride   Intravenous Once  . amLODipine  10 mg Oral Daily  . carvedilol  25 mg Oral BID WC  . Chlorhexidine Gluconate Cloth  6 each Topical Daily  . cloNIDine  0.1 mg Oral TID  . feeding supplement (NEPRO CARB STEADY)  237 mL Oral TID BM   . ferric citrate  420 mg Oral TID  . gabapentin  100 mg Oral QHS  . isosorbide mononitrate  30 mg Oral BID  . losartan  100 mg Oral QPM  . multivitamin  1 tablet Oral QHS  . torsemide  40 mg Oral Daily  . 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 End Stage renal disease:  Hemodialysis TTS schedule Complication of dialysis device: permcath exchanged  Flush peritoneal dialysis catheter weekly    2. Hypertension:  - losartan, torsemide, carvedilol, amlodipine, clonidine, and isosorbide mononitrate.   3. Anemia of chronic kidney disease:status post PRBC transfusion 12/1 - EPO with HD treatment  4. Secondary Hyperparathyroidism: with calciphylaxis - ferric citrate - Sodium thiosulfate.   5. Bilateral lower extremity wounds - wound vac placed by Dr. Lysle Pearl. Appreciate input from surgery and Infectious disease - Discharge on vancomycin 1 gram and ceftazidime 1 gram with dialysis until 12/12.    LOS: 4 Ceira Hoeschen 12/2/20202:25 PM

## 2019-09-18 NOTE — Progress Notes (Signed)
Patient ID: [name], female   DOB: 07-23-1986, 32 y.o.   MRN: [id number]  Pt's medicare part B does not cover equipment at home--Dr Lysle Pearl recommends wet to dry dressing changes MWF per home health RN

## 2019-09-18 NOTE — Discharge Instructions (Signed)
Patient resume hemodialysis Tuesday Thursday Saturday upon her scheduled

## 2019-09-18 NOTE — Progress Notes (Signed)
[name] to be D/C'd home per MD order.  Discussed prescriptions and follow up appointments with the patient. Prescriptions given to patient, medication list explained in detail. Pt verbalized understanding.  Allergies as of 09/18/2019   No Known Allergies     Medication List    TAKE these medications   amLODipine 10 MG tablet Commonly known as: NORVASC Take 10 mg by mouth daily.   ascorbic acid 500 MG tablet Commonly known as: VITAMIN C Take 1 tablet (500 mg total) by mouth 2 (two) times daily.   aspirin 81 MG EC tablet Take 1 tablet (81 mg total) by mouth daily.   Auryxia 1 GM 210 MG(Fe) tablet Generic drug: ferric citrate Take 420 mg by mouth 3 (three) times daily.   carvedilol 25 MG tablet Commonly known as: COREG Take 1 tablet (25 mg total) by mouth 2 (two) times daily with a meal.   cloNIDine 0.1 MG tablet Commonly known as: CATAPRES Take 0.1 mg by mouth 3 (three) times daily.   gabapentin 100 MG capsule Commonly known as: NEURONTIN Take 100 mg by mouth at bedtime.   isosorbide mononitrate 30 MG 24 hr tablet Commonly known as: IMDUR Take 1 tablet (30 mg total) by mouth 2 (two) times daily.   losartan 100 MG tablet Commonly known as: COZAAR Take 100 mg by mouth every evening.   multivitamin Tabs tablet Take 1 tablet by mouth at bedtime.   torsemide 20 MG tablet Commonly known as: DEMADEX Take 40 mg by mouth daily.       Vitals:   09/18/19 0822 09/18/19 1148  BP: (!) 142/90 129/76  Pulse: 87 80  Resp: 18 20  Temp: 98.6 F (37 C) 98.5 F (36.9 C)  SpO2: 98% 92%    Skin clean, dry and intact without evidence of skin break down, no evidence of skin tears noted. IV catheter discontinued intact. Site without signs and symptoms of complications. Dressing and pressure applied. Pt denies pain at this time. No complaints noted.  An After Visit Summary was printed and given to the patient. Patient escorted via [place], and D/C home via private  auto.  Chuck Hint RN [facility] [employer]

## 2019-09-18 NOTE — TOC Initial Note (Signed)
Transition of Care [facility]) - Initial/Assessment Note    Patient Details  Name: [name] MRN: [id number] Date of Birth: September 26, 1986  [facility]) CM/SW Contact:    Trecia Rogers, LCSW Phone Number: 09/18/2019, 2:48 PM  Clinical Narrative:                  Patient arrived from home. Patient arrived at hospital due to infection of the anterior lower legs. Patient had a post debridement, wound VAC placement and a formal biopsy to confirm calciphylaxis diagnosis. Patient is going to go back to living at home and maybe stay with her mother. Patient does have a PCP who is: Investment banker, corporate. Patient's pharmacy is [facility] on [address]. Patient stated that she has no problem getting her meds. Patient does not have any current DMEs. Patient will be following up with home health with nurse for wound care through [facility]. And [facility] with Brad for the wound vac.   Expected Discharge Plan: [place] Barriers to Discharge: No Barriers Identified   Patient Goals and CMS Choice Patient states their goals for this hospitalization and ongoing recovery are:: "go home" CMS Medicare.gov Compare Post Acute Care list provided to:: Patient Choice offered to / list presented to : Patient, Parent  Expected Discharge Plan and Services Expected Discharge Plan: [place] In-house Referral: Clinical Social Work   Post Acute Care Choice: Durable Medical Equipment, Home Health Living arrangements for the past 2 months: Single Family Home Expected Discharge Date: 09/18/19               DME Arranged: Vac DME Agency: [facility] Date DME Agency Contacted: 09/18/19 Time DME Agency Contacted: [id number] Representative spoke with at DME Agency: [place]oy [place]: RN [place] Agency: [place] Date [place]: 09/18/19 Time [place]: 86 Representative spoke with at [place]: Malachy Mood  Prior Living  Arrangements/Services Living arrangements for the past 2 months: [place] with:: Self Patient language and need for interpreter reviewed:: Yes Do you feel safe going back to the place where you live?: Yes      Need for Family Participation in Patient Care: Yes (Comment)(pt's mother) Care giver support system in place?: No (comment)   Criminal Activity/Legal Involvement Pertinent to Current Situation/Hospitalization: No - Comment as needed  Activities of Daily Living Home Assistive Devices/Equipment: None ADL Screening (condition at time of admission) Patient's cognitive ability adequate to safely complete daily activities?: No Is the patient deaf or have difficulty hearing?: No Does the patient have difficulty seeing, even when wearing glasses/contacts?: No Does the patient have difficulty concentrating, remembering, or making decisions?: No Patient able to express need for assistance with ADLs?: Yes Does the patient have difficulty dressing or bathing?: No Independently performs ADLs?: Yes (appropriate for developmental age) Does the patient have difficulty walking or climbing stairs?: No Weakness of Legs: None Weakness of Arms/Hands: None  Permission Sought/Granted                  Emotional Assessment Appearance:: Appears stated age Attitude/Demeanor/Rapport: Gracious, Charismatic, Engaged Affect (typically observed): Accepting, Adaptable Orientation: : Oriented to Self, Oriented to Place, Oriented to  Time, Oriented to Situation   Psych Involvement: No (comment)  Admission diagnosis:  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 Obesity, Class III, BMI 40-49.9 (morbid obesity) ([place]) 09/15/2019  . Infection of anterior  lower leg 09/14/2019  . Calciphylaxis   . ESRD (end stage renal disease) ([place])   . Chronic diastolic CHF (congestive heart failure) ([place])   . Sepsis due to  cellulitis ([place]) 07/02/2019  . HTN (hypertension) 03/11/2016  . Renal insufficiency 03/11/2016  . Menorrhagia   . Nephrotic syndrome   . Anemia in chronic kidney disease   . Iron deficiency anemia 02/12/2016  . CHF (congestive heart failure) ([place]) 02/11/2016   PCP:  [facility]:   [facility] [address]), [place] [address] [place]) [place] [address] Phone: [phone] Fax: [phone]     Social Determinants of Health (SDOH) Interventions    Readmission Risk Interventions Readmission Risk Prevention Plan 09/18/2019  Transportation Screening Complete  PCP or Specialist Appt within 3-5 Days Complete  HRI or [place] Complete  Social Work Consult for [place] Planning/Counseling Complete  Palliative Care Screening Not Applicable  Medication Review Press photographer) Complete  Some recent data might be hidden

## 2019-09-18 NOTE — Consult Note (Signed)
WOC Nurse Consult Note: Reason for Consult: Bilateral full thickness wounds to lower extremities.  Surgical debridement and wounds consistent with calcihphylaxis. Spoke with Dr Lysle Pearl who changed dressings Tuesday at 4 PM.  He is pleased with how wounds looked and would like [place] team to change on Friday.  I informed patient of plan and she is in agreement. Both legs have NPWT (VAC) therapy in place.  Together with [employer].  Patient understands not to get up without assistance and states she has not had trouble with the double tubing and Y connector.  May connect to 2 systems if safety becomes a concern.  Wound type: Inflammatory/calciphylaxis Pressure Injury POA: NA Measurement:not assessed  One lesion on left leg and two on right leg.  Wound [id number] intact  Changed Tuesday PM  Will change again Friday Drainage (amount, consistency, odor) moderate serosanguinous in canister Periwound:intact  Dressing procedure/placement/frequency: Change M/W/F  NExt dressing change 09/20/19 WOC team will follow.  Domenic Moras MSN, RN, FNP-BC CWON Wound, Ostomy, Continence Nurse Pager [phone]

## 2019-09-18 NOTE — Progress Notes (Addendum)
Subjective:  CC: [name] is a 33 y.o. female  Hospital stay day 4, 1 Day Post-Op BLE wounds debridement and wound VAC placement  HPI: No acute issues reported overnight.  sedated for permcath exchange procedure ROS:  Unable to obtain due to sedation  Objective:   Temp:  [98.9 F (37.2 C)-99.2 F (37.3 C)] 99.2 F (37.3 C) (12/02 0649) Pulse Rate:  [89-109] 90 (12/02 0649) Resp:  [14-31] 20 (12/02 0649) BP: (120-210)/(74-120) 120/74 (12/02 0649) SpO2:  [89 %-100 %] 95 % (12/02 0649) Weight:  [136.8 kg] 136.8 kg (12/01 1045)     Height: 5\' 6"  (167.6 cm) Weight: (!) 136.8 kg BMI (Calculated): 48.7   Intake/Output this shift:   Intake/Output Summary (Last 24 hours) at 09/18/2019 0810 Last data filed at 09/17/2019 1519 Gross per 24 hour  Intake 1365.14 ml  Output 3000 ml  Net -1634.86 ml    Constitutional :  alert, cooperative, appears stated age and no distress  Respiratory:  clear to auscultation bilaterally  Cardiovascular:  regular rate and rhythm  Gastrointestinal: soft, non-tender; bowel sounds normal; no masses,  no organomegaly.   Skin: Cool and moist.  Wound vacs intact, no drainage noted in canister.  Psychiatric: Normal affect, non-agitated, not confused       LABS:  CMP Latest Ref Rng & Units 09/17/2019 09/16/2019 09/15/2019  Glucose 70 - 99 mg/dL 92 98 -  BUN 6 - 20 mg/dL 79(H) 73(H) -  Creatinine 0.44 - 1.00 mg/dL 12.92(H) 11.00(H) 10.26(H)  Sodium 135 - 145 mmol/L 139 139 -  Potassium 3.5 - 5.1 mmol/L 5.1 5.0 -  Chloride 98 - 111 mmol/L 107 105 -  CO2 22 - 32 mmol/L 19(L) 19(L) -  Calcium 8.9 - 10.3 mg/dL 7.8(L) 7.8(L) -  Total Protein 6.5 - 8.1 g/dL - - -  Total Bilirubin 0.3 - 1.2 mg/dL - - -  Alkaline Phos 38 - 126 U/L - - -  AST 15 - 41 U/L - - -  ALT 0 - 44 U/L - - -   CBC Latest Ref Rng & Units 09/17/2019 09/17/2019 09/16/2019  WBC 4.0 - 10.5 K/uL 7.8 8.6 13.0(H)  Hemoglobin 12.0 - 15.0 g/dL 7.1(L) 6.8(L) 7.2(L)  Hematocrit 36.0 - 46.0 %  23.0(L) 21.4(L) 22.9(L)  Platelets 150 - 400 K/uL 291 279 295    RADS: n/a Assessment:   BLE wounds debridement and wound VAC placement  possible calciphylaxis.  Path pending.  She remains comfortable.    BLE wound vac changed at bedside under conscious sedation.  Wound size grossly unchanged from initial operation, remains no signs of active bleeding.  Subcutaneous tissue slightly darker, but still viable.  No further signs of purulent drainage or infection.  Next wound vac change per wound care RN.  Surgery will peripherally follow.  Please call for any questions or concerns.  Wound cultures order placed to be obtained at next dressing change

## 2019-09-18 NOTE — TOC Transition Note (Addendum)
Transition of Care [facility]) - CM/SW Discharge Note   Patient Details  Name: [name] MRN: [id number] Date of Birth: Jun 08, 1986  [facility]) CM/SW Contact:  Trecia Rogers, LCSW Phone Number: 09/18/2019, 2:54 PM   Clinical Narrative:     Patient will discharge home with home health through [facility] and will start on Friday for wound care (Patient denied for wound vac due to not having medicare part B. Wet to dry will be done. Malachy Mood from [place] agreed.). Brad from [place] is shipping a wound vac to the hospital for the patient to take home. Patient will be going back to her home and sometimes staying with her mother. Cheryl from [place] will call to confirm.   Final next level of care: [place] Barriers to Discharge: No Barriers Identified   Patient Goals and CMS Choice Patient states their goals for this hospitalization and ongoing recovery are:: "go home" CMS Medicare.gov Compare Post Acute Care list provided to:: Patient Choice offered to / list presented to : Patient, Parent  Discharge Placement                       Discharge Plan and Services In-house Referral: Clinical Social Work   Post Acute Care Choice: Durable Medical Equipment, Home Health          DME Arranged: Vac DME Agency: [employer] Date DME Agency Contacted: 09/18/19 Time DME Agency Contacted: [id number] Representative spoke with at DME Agency: [place]: RN [place] Agency: [place] Date [place]: 09/18/19 Time [place]: 1330 Representative spoke with at [place]: [place] ([place]) Interventions     Readmission Risk Interventions Readmission Risk Prevention Plan 09/18/2019  Transportation Screening Complete  PCP or Specialist Appt within 3-5 Days Complete  HRI or [place] Complete  Social Work Consult for [place] Planning/Counseling Complete  Palliative Care  Screening Not Applicable  Medication Review Press photographer) Complete  Some recent data might be hidden

## 2019-09-18 NOTE — Progress Notes (Signed)
[facility] Daily Progress Note   Subjective: 1 Day Post-Op: 1. Insertion (exchange) of tunneled dialysis catheter right IJ approach same venous access.  Patient with some soreness to permcath area this AM. No issues overnight.   Objective: Vitals:   09/17/19 1849 09/18/19 0649 09/18/19 0822 09/18/19 1148  BP: (!) 165/84 120/74 (!) 142/90 129/76  Pulse: 99 90 87 80  Resp:  20 18 20   Temp:  99.2 F (37.3 C) 98.6 F (37 C) 98.5 F (36.9 C)  TempSrc:  Oral Oral Oral  SpO2:  95% 98% 92%  Weight:      Height:        Intake/Output Summary (Last 24 hours) at 09/18/2019 1217 Last data filed at 09/18/2019 [id number] Gross per 24 hour  Intake 1085.14 ml  Output 3000 ml  Net -1914.86 ml   Physical Exam: A&Ox3, NAD Chest:   Right IJ Permcath: Intact, clean and dry CV: RRR Pulmonary: CTA Bilaterally Abdomen: Soft, Nontender, Nondistended  PD Catheter: Intact, no signs of infection   Laboratory: CBC    Component Value Date/Time   WBC 7.8 09/17/2019 0939   HGB 7.1 (L) 09/17/2019 0939   HGB 12.5 12/31/2013 1020   HCT 23.0 (L) 09/17/2019 0939   HCT 38.5 12/31/2013 1020   PLT 291 09/17/2019 0939   PLT 239 12/31/2013 1020   BMET    Component Value Date/Time   NA 139 09/17/2019 0614   NA 135 (L) 12/31/2013 1020   K 5.1 09/17/2019 0614   K 3.9 12/31/2013 1020   CL 107 09/17/2019 0614   CL 105 12/31/2013 1020   CO2 19 (L) 09/17/2019 0614   CO2 26 12/31/2013 1020   GLUCOSE 92 09/17/2019 0614   GLUCOSE 243 (H) 12/31/2013 1020   BUN 79 (H) 09/17/2019 0614   BUN 19 (H) 12/31/2013 1020   CREATININE 12.92 (H) 09/17/2019 0614   CREATININE 2.12 (H) 12/31/2013 1020   CALCIUM 7.8 (L) 09/17/2019 0614   CALCIUM 8.5 12/31/2013 1020   GFRNONAA 3 (L) 09/17/2019 0614   GFRNONAA 31 (L) 12/31/2013 1020   GFRAA 4 (L) 09/17/2019 0614   GFRAA 36 (L) 12/31/2013 1020   Assessment/Planning: The patient is a 33 year old female with multiple medical issues including  end-stage renal disease on hemodialysis currently maintained by a PermCath. 1) ESRD: PermCath successfully exchanged yesterday.  Minimal discomfort to PermCath site.  Dressing is clean dry and intact.  Site without swelling.  Continue to dialyze through PermCath until draining on peritoneal dialysis catheter has completed. 2) Vascular surgery will sign off at this time  Discussed with Dr. Eber Hong [facility] PA-C 09/18/2019 12:17 PM

## 2019-09-18 NOTE — Progress Notes (Signed)
   Date of Admission:  09/14/2019      ID: [name] is a 33 y.o. female Active Problems:   Anemia in chronic kidney disease   Infection of anterior lower leg   Calciphylaxis   ESRD (end stage renal disease) (HCC)   Chronic diastolic CHF (congestive heart failure) (HCC)   Obesity, Class III, BMI 40-49.9 (morbid obesity) ([place])    Subjective: Doing well  Medications:  . sodium chloride   Intravenous Once  . amLODipine  10 mg Oral Daily  . carvedilol  25 mg Oral BID WC  . Chlorhexidine Gluconate Cloth  6 each Topical Daily  . cloNIDine  0.1 mg Oral TID  . feeding supplement (NEPRO CARB STEADY)  237 mL Oral TID BM  . ferric citrate  420 mg Oral TID  . gabapentin  100 mg Oral QHS  . isosorbide mononitrate  30 mg Oral BID  . losartan  100 mg Oral QPM  . multivitamin  1 tablet Oral QHS  . torsemide  40 mg Oral Daily  . vitamin C  500 mg Oral BID    Objective: Vital signs in last 24 hours: Temp:  [98.5 F (36.9 C)-99.2 F (37.3 C)] 98.5 F (36.9 C) (12/02 1148) Pulse Rate:  [80-109] 80 (12/02 1148) Resp:  [14-31] 20 (12/02 1148) BP: (120-210)/(74-120) 129/76 (12/02 1148) SpO2:  [91 %-100 %] 92 % (12/02 1148)  PHYSICAL EXAM:  General: Alert, cooperative, no distress, appears stated age.  B/l calf wound vac Lab Results CBC Latest Ref Rng & Units 09/17/2019 09/17/2019 09/16/2019  WBC 4.0 - 10.5 K/uL 7.8 8.6 13.0(H)  Hemoglobin 12.0 - 15.0 g/dL 7.1(L) 6.8(L) 7.2(L)  Hematocrit 36.0 - 46.0 % 23.0(L) 21.4(L) 22.9(L)  Platelets 150 - 400 K/uL 291 279 295    CMP Latest Ref Rng & Units 09/17/2019 09/16/2019 09/15/2019  Glucose 70 - 99 mg/dL 92 98 -  BUN 6 - 20 mg/dL 79(H) 73(H) -  Creatinine 0.44 - 1.00 mg/dL 12.92(H) 11.00(H) 10.26(H)  Sodium 135 - 145 mmol/L 139 139 -  Potassium 3.5 - 5.1 mmol/L 5.1 5.0 -  Chloride 98 - 111 mmol/L 107 105 -  CO2 22 - 32 mmol/L 19(L) 19(L) -  Calcium 8.9 - 10.3 mg/dL 7.8(L) 7.8(L) -  Total Protein 6.5 - 8.1 g/dL - - -  Total  Bilirubin 0.3 - 1.2 mg/dL - - -  Alkaline Phos 38 - 126 U/L - - -  AST 15 - 41 U/L - - -  ALT 0 - 44 U/L - - -   Liver Panel Recent Labs    09/17/19 0614  ALBUMIN 2.8*   Sedimentation Rate No results for input(s): ESRSEDRATE in the last 72 hours. C-Reactive Protein No results for input(s): CRP in the last 72 hours.  Microbiology: 09/14/19- BC- neg  Studies/Results: No results found.   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

## 2019-09-19 LAB — CULTURE, BLOOD (ROUTINE X 2)
Culture: NO GROWTH
Culture: NO GROWTH
Special Requests: ADEQUATE
Special Requests: ADEQUATE

## 2019-10-31 ENCOUNTER — Telehealth: Payer: Self-pay | Admitting: Pharmacy Technician

## 2019-10-31 NOTE — Telephone Encounter (Signed)
Patient failed to provide requested 2020 financial documentation. No additional medication assistance will be provided by [facility] without the required proof of income documentation. Patient notified by letter  Rochel Privett CPhT [facility] 

## 2019-11-07 ENCOUNTER — Telehealth: Payer: Self-pay | Admitting: Pharmacy Technician

## 2019-12-03 ENCOUNTER — Telehealth: Payer: Self-pay

## 2019-12-03 NOTE — Telephone Encounter (Signed)
LM for patient to call for new patient questionaire.

## 2019-12-04 ENCOUNTER — Ambulatory Visit: Payer: Medicare Other | Admitting: Student in an Organized Health Care Education/Training Program

## 2019-12-04 NOTE — Progress Notes (Deleted)
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 25OHVITD1, 25OHVITD2, 25OHVITD3, TESTOFREE, TESTOSTERONE  Endocrine Lab Results  Component Value Date   GLUCOSE 92 09/17/2019   GLUCOSEU 50 (A) 02/11/2016   HGBA1C 5.1 07/04/2019   TSH 2.104 02/14/2016    Neuropathy Lab Results  Component Value Date   VITAMINB12 305 02/13/2016   FOLATE 6.8 02/13/2016   HGBA1C 5.1 07/04/2019   HIV Non Reactive 07/03/2019    CNS No results found for: COLORCSF, APPEARCSF, RBCCOUNTCSF, WBCCSF, POLYSCSF, LYMPHSCSF, EOSCSF, PROTEINCSF, GLUCCSF, JCVIRUS, CSFOLI, IGGCSF, LABACHR, ACETBL, LABACHR, ACETBL  Inflammation (CRP: Acute  ESR: Chronic) Lab Results  Component Value Date   ESRSEDRATE 90 (H) 09/14/2019   LATICACIDVEN 1.6 09/14/2019  Rheumatology Lab Results  Component Value Date   ANA Negative 07/09/2019    Coagulation Lab Results  Component Value Date   INR 1.1 07/02/2019   LABPROT 14.4 07/02/2019   APTT 28 02/12/2016   PLT 291 09/17/2019     Cardiovascular Lab Results  Component Value Date   BNP 469.0 (H) 02/11/2016   TROPONINI <0.03 02/16/2016   HGB 7.1 (L) 09/17/2019   HCT 23.0 (L) 09/17/2019    Screening Lab Results  Component Value Date   SARSCOV2NAA NEGATIVE 09/14/2019   MRSAPCR NEGATIVE 07/07/2019   HIV Non Reactive 07/03/2019   PREGTESTUR NEGATIVE 02/11/2016    Cancer No results found for: CEA, CA125, LABCA2  Allergens No results found for: ALMOND, APPLE, ASPARAGUS, AVOCADO, BANANA, BARLEY, BASIL, BAYLEAF, GREENBEAN, LIMABEAN, WHITEBEAN, BEEFIGE, REDBEET, BLUEBERRY, BROCCOLI, CABBAGE, MELON, CARROT, CASEIN, CASHEWNUT, CAULIFLOWER, CELERY    Note: Lab results reviewed.   PFSH  Drug: Ms. Mallen  reports no history of drug use. Alcohol:  reports no history of alcohol use. Tobacco:  reports that she has quit smoking. Her smoking use included cigars. She has never used smokeless tobacco. Medical:  has a past medical history of Anemia of chronic disease, Asthma, CHF (congestive heart failure) ([place]), CKD (chronic kidney disease), stage III, Diabetes ([place]), Hypertension, and Morbid obesity ([place]). Family: family history includes CAD in her mother; Cancer in her maternal grandfather and maternal grandmother; Hypertension in her mother; Kidney disease in her father; Multiple sclerosis in her mother.  Past Surgical History:  Procedure Laterality Date  . APPLICATION OF WOUND VAC Bilateral 09/15/2019   Procedure: APPLICATION OF WOUND VAC;  Surgeon: Benjamine Sprague, DO;  Location: [facility];  Service: General;  Laterality: Bilateral;  . DIALYSIS/PERMA CATHETER INSERTION N/A 02/28/2019   Procedure: DIALYSIS/PERMA CATHETER INSERTION;  Surgeon: Algernon Huxley, MD;  Location: [place] [facility];  Service: Cardiovascular;  Laterality: N/A;  . DIALYSIS/PERMA CATHETER INSERTION N/A 09/17/2019   Procedure: DIALYSIS/PERMA CATHETER EXCHANGE;  Surgeon: Katha Cabal, MD;  Location: [place] [facility];  Service: Cardiovascular;   Laterality: N/A;  . pd cath    . TONSILLECTOMY    . WOUND DEBRIDEMENT Bilateral 09/15/2019   Procedure: DEBRIDEMENT WOUND;  Surgeon: Benjamine Sprague, DO;  Location: [facility];  Service: General;  Laterality: Bilateral;   Active Ambulatory Problems    Diagnosis Date Noted  . CHF (congestive heart failure) ([place]) 02/11/2016  . Anemia in chronic kidney disease   . Menorrhagia   . Nephrotic syndrome   . Iron deficiency anemia 02/12/2016  . HTN (hypertension) 03/11/2016  . Renal insufficiency 03/11/2016  . Sepsis due to cellulitis ([place]) 07/02/2019  . Infection of anterior lower leg 09/14/2019  . Calciphylaxis   . ESRD (end stage renal disease) ([place])   . Chronic diastolic CHF (congestive heart failure) ([place])   . Obesity, Class III, BMI 40-49.9 (morbid obesity) ([place]) 09/15/2019   Resolved Ambulatory Problems    Diagnosis Date Noted  . Fluid overload 02/11/2016  . Uncontrolled hypertension 02/11/2016  . Acute congestive heart failure ([place])   . Hypertensive urgency   . Peripheral edema   . Sinus tachycardia   . Acute on chronic renal failure [facility])    Past Medical History:  Diagnosis Date  . Anemia of chronic disease   . Asthma   . CKD (chronic kidney disease), stage III   . Diabetes ([place])   . Hypertension   . Morbid obesity ([place])    Constitutional Exam  General appearance: Well nourished,  well developed, and well hydrated. In no apparent acute distress There were no vitals filed for this visit. BMI Assessment: Estimated body mass index is 48.68 kg/m as calculated from the following:   Height as of 09/14/19: 5' 6"  (1.676 m).   Weight as of 09/17/19: 301 lb 9.4 oz (136.8 kg).  BMI interpretation table: BMI level Category Range association with higher incidence of chronic pain  <18 kg/m2 Underweight   18.5-24.9 kg/m2 Ideal body weight   25-29.9 kg/m2 Overweight Increased incidence by 20%  30-34.9 kg/m2 Obese (Class I) Increased incidence by 68%  35-39.9 kg/m2 Severe obesity  (Class II) Increased incidence by 136%  >40 kg/m2 Extreme obesity (Class III) Increased incidence by 254%   Patient's current BMI Ideal Body weight  There is no height or weight on file to calculate BMI. Patient weight not recorded   BMI Readings from Last 4 Encounters:  09/17/19 48.68 kg/m  07/06/19 46.65 kg/m  02/28/19 54.88 kg/m  02/20/18 53.30 kg/m   Wt Readings from Last 4 Encounters:  09/17/19 (!) 301 lb 9.4 oz (136.8 kg)  07/06/19 289 lb 0.4 oz (131.1 kg)  02/28/19 (!) 340 lb (154.2 kg)  02/20/18 (!) 330 lb 4 oz (149.8 kg)    Psych/Mental status: Alert, oriented x 3 (person, place, & time)       Eyes: PERLA Respiratory: No evidence of acute respiratory distress  Cervical Spine Area Exam  Skin & Axial Inspection: No masses, redness, edema, swelling, or associated skin lesions Alignment: Symmetrical Functional ROM: Unrestricted ROM      Stability: No instability detected Muscle Tone/Strength: Functionally intact. No obvious neuro-muscular anomalies detected. Sensory (Neurological): Unimpaired Palpation: No palpable anomalies              Upper Extremity (UE) Exam    Side: Right upper extremity  Side: Left upper extremity  Skin & Extremity Inspection: Skin color, temperature, and hair growth are WNL. No peripheral edema or cyanosis. No masses, redness, swelling, asymmetry, or associated skin lesions. No contractures.  Skin & Extremity Inspection: Skin color, temperature, and hair growth are WNL. No peripheral edema or cyanosis. No masses, redness, swelling, asymmetry, or associated skin lesions. No contractures.  Functional ROM: Unrestricted ROM          Functional ROM: Unrestricted ROM          Muscle Tone/Strength: Functionally intact. No obvious neuro-muscular anomalies detected.  Muscle Tone/Strength: Functionally intact. No obvious neuro-muscular anomalies detected.  Sensory (Neurological): Unimpaired          Sensory (Neurological): Unimpaired          Palpation:  No palpable anomalies              Palpation: No palpable anomalies              Provocative Test(s):  Phalen's test: deferred Tinel's test: deferred Apley's scratch test (touch opposite shoulder):  Action 1 (Across chest): deferred Action 2 (Overhead): deferred Action 3 (LB reach): deferred   Provocative Test(s):  Phalen's test: deferred Tinel's test: deferred Apley's scratch test (touch opposite shoulder):  Action 1 (Across chest): deferred Action 2 (Overhead): deferred Action 3 (LB reach): deferred    Thoracic Spine Area Exam  Skin & Axial Inspection: No masses, redness, or swelling Alignment: Symmetrical Functional ROM: Unrestricted ROM Stability: No instability detected Muscle Tone/Strength: Functionally intact. No obvious neuro-muscular anomalies detected. Sensory (Neurological): Unimpaired Muscle strength & Tone: No palpable anomalies  Lumbar Spine Area Exam  Skin & Axial Inspection:  No masses, redness, or swelling Alignment: Symmetrical Functional ROM: Unrestricted ROM       Stability: No instability detected Muscle Tone/Strength: Functionally intact. No obvious neuro-muscular anomalies detected. Sensory (Neurological): Unimpaired Palpation: No palpable anomalies       Provocative Tests: Hyperextension/rotation test: deferred today       Lumbar quadrant test (Kemp's test): deferred today       Lateral bending test: deferred today       Patrick's Maneuver: deferred today                   FABER* test: deferred today                   S-I anterior distraction/compression test: deferred today         S-I lateral compression test: deferred today         S-I Thigh-thrust test: deferred today         S-I Gaenslen's test: deferred today         *(Flexion, ABduction and External Rotation)  Gait & Posture Assessment  Ambulation: Unassisted Gait: Relatively normal for age and body habitus Posture: WNL   Lower Extremity Exam    Side: Right lower extremity  Side: Left  lower extremity  Stability: No instability observed          Stability: No instability observed          Skin & Extremity Inspection: Skin color, temperature, and hair growth are WNL. No peripheral edema or cyanosis. No masses, redness, swelling, asymmetry, or associated skin lesions. No contractures.  Skin & Extremity Inspection: Skin color, temperature, and hair growth are WNL. No peripheral edema or cyanosis. No masses, redness, swelling, asymmetry, or associated skin lesions. No contractures.  Functional ROM: Unrestricted ROM                  Functional ROM: Unrestricted ROM                  Muscle Tone/Strength: Functionally intact. No obvious neuro-muscular anomalies detected.  Muscle Tone/Strength: Functionally intact. No obvious neuro-muscular anomalies detected.  Sensory (Neurological): Unimpaired        Sensory (Neurological): Unimpaired        DTR: Patellar: deferred today Achilles: deferred today Plantar: deferred today  DTR: Patellar: deferred today Achilles: deferred today Plantar: deferred today  Palpation: No palpable anomalies  Palpation: No palpable anomalies   Assessment  Primary Diagnosis & Pertinent Problem List: There were no encounter diagnoses.  Visit Diagnosis (New problems to examiner): No diagnosis found. Plan of Care (Initial workup plan)  Note: Ms. Leech was reminded that as per protocol, today's visit has been an evaluation only. We have not taken over the patient's controlled substance management.  Problem-specific plan: No problem-specific Assessment & Plan notes found for this encounter.  Lab Orders  No laboratory test(s) ordered today   Imaging Orders  No imaging studies ordered today   Referral Orders  No referral(s) requested today   Procedure Orders    No procedure(s) ordered today   Pharmacotherapy (current): Medications ordered:  No orders of the defined types were placed in this encounter.  Medications administered during this  visit: [name] A. Suen had no medications administered during this visit.   Pharmacological management options:  Opioid Analgesics: The patient was informed that there is no guarantee that she would be a candidate for opioid analgesics. The decision will be made following CDC guidelines. This decision will be based  on the results of diagnostic studies, as well as Ms. Ceci's risk profile.   Membrane stabilizer: To be determined at a later time  Muscle relaxant: To be determined at a later time  NSAID: To be determined at a later time  Other analgesic(s): To be determined at a later time   Interventional management options: Ms. Pletz was informed that there is no guarantee that she would be a candidate for interventional therapies. The decision will be based on the results of diagnostic studies, as well as Ms. Elmore's risk profile.  Procedure(s) under consideration:  ***   Provider-requested follow-up: No follow-ups on file.  Future Appointments  Date Time Provider [place]  12/04/2019  2:15 PM Gillis Santa, MD [facility] None    Note by: Gillis Santa, MD Date: 12/04/2019; Time: 10:07 AM

## 2019-12-05 ENCOUNTER — Ambulatory Visit: Payer: Medicare Other | Admitting: Student in an Organized Health Care Education/Training Program

## 2020-01-30 ENCOUNTER — Ambulatory Visit
Admission: RE | Admit: 2020-01-30 | Discharge: 2020-01-30 | Disposition: A | Discharge status: Home / Self Care | Payer: Medicaid Other | Source: Ambulatory Visit | Attending: Nephrology | Admitting: Nephrology

## 2020-01-30 ENCOUNTER — Other Ambulatory Visit: Payer: Self-pay

## 2020-01-30 DIAGNOSIS — D649 Anemia, unspecified: Secondary | ICD-10-CM | POA: Diagnosis present

## 2020-01-30 LAB — PREPARE RBC (CROSSMATCH)

## 2020-01-30 LAB — HEMOGLOBIN AND HEMATOCRIT, BLOOD
HCT: 22.6 % — ABNORMAL LOW (ref 36.0–46.0)
Hemoglobin: 7.3 g/dL — ABNORMAL LOW (ref 12.0–15.0)

## 2020-01-30 NOTE — OR Nursing (Signed)
Pt resting comfortably, NAD , VS. Blood transfusing, will continue to monitor

## 2020-01-30 NOTE — OR Nursing (Signed)
This RN called dialysis center and spoke with pt nurse Seth Bake. Made aware that pt has received aprox 823mL of blood today, per nurse no need to change her PD cath routine for tonight. Pt dialyzes at home with PD cath

## 2020-01-30 NOTE — OR Nursing (Signed)
Pt provided with lunch tray at this time. NAD noted

## 2020-01-30 NOTE — OR Nursing (Signed)
Pt is resting comfortably, VS, NAD noted. Pt blood is transfusing. Report given to [place], [place]

## 2020-01-31 LAB — BPAM RBC
Blood Product Expiration Date: 202104212359
Blood Product Expiration Date: 202105132359
ISSUE DATE / TIME: 202104150941
ISSUE DATE / TIME: 202104151402
Unit Type and Rh: 600
Unit Type and Rh: 6200

## 2020-01-31 LAB — TYPE AND SCREEN
ABO/RH(D): A POS
Antibody Screen: NEGATIVE
Unit division: 0
Unit division: 0

## 2020-03-03 NOTE — Telephone Encounter (Signed)
NA

## 2020-03-12 ENCOUNTER — Telehealth (INDEPENDENT_AMBULATORY_CARE_PROVIDER_SITE_OTHER): Payer: Self-pay

## 2020-03-12 NOTE — Telephone Encounter (Signed)
Spoke with the patient and she is now scheduled with Dr. [name] for a permcath removal on 03/18/20 with a 12:15 pm arrival time to the [facility]. Patient will do covid testing on 03/13/20 between 8-1 pm a the [facility]. Pre-procedure instructions were discussed and will be mailed.

## 2020-03-13 ENCOUNTER — Other Ambulatory Visit
Admission: RE | Admit: 2020-03-13 | Discharge: 2020-03-13 | Disposition: A | Discharge status: Home / Self Care | Payer: HRSA Program | Source: Ambulatory Visit | Attending: Vascular Surgery | Admitting: Vascular Surgery

## 2020-03-13 ENCOUNTER — Other Ambulatory Visit: Payer: Self-pay

## 2020-03-13 DIAGNOSIS — Z20822 Contact with and (suspected) exposure to covid-19: Secondary | ICD-10-CM | POA: Insufficient documentation

## 2020-03-13 DIAGNOSIS — Z01812 Encounter for preprocedural laboratory examination: Secondary | ICD-10-CM | POA: Insufficient documentation

## 2020-03-14 LAB — SARS CORONAVIRUS 2 (TAT 6-24 HRS): SARS Coronavirus 2: NEGATIVE

## 2020-03-17 ENCOUNTER — Other Ambulatory Visit (INDEPENDENT_AMBULATORY_CARE_PROVIDER_SITE_OTHER): Payer: Self-pay | Admitting: Nurse Practitioner

## 2020-03-18 ENCOUNTER — Ambulatory Visit
Admission: RE | Admit: 2020-03-18 | Discharge: 2020-03-18 | Disposition: A | Discharge status: Home / Self Care | Payer: Medicaid Other | Attending: Vascular Surgery | Admitting: Vascular Surgery

## 2020-03-18 ENCOUNTER — Encounter: Payer: Self-pay | Admitting: Vascular Surgery

## 2020-03-18 ENCOUNTER — Other Ambulatory Visit: Payer: Self-pay

## 2020-03-18 ENCOUNTER — Encounter
Admission: RE | Disposition: A | Discharge status: Home / Self Care | Payer: Self-pay | Source: Home / Self Care | Attending: Vascular Surgery

## 2020-03-18 DIAGNOSIS — N186 End stage renal disease: Secondary | ICD-10-CM | POA: Diagnosis not present

## 2020-03-18 DIAGNOSIS — Z6841 Body Mass Index (BMI) 40.0 and over, adult: Secondary | ICD-10-CM | POA: Diagnosis not present

## 2020-03-18 DIAGNOSIS — J45909 Unspecified asthma, uncomplicated: Secondary | ICD-10-CM | POA: Diagnosis not present

## 2020-03-18 DIAGNOSIS — D631 Anemia in chronic kidney disease: Secondary | ICD-10-CM | POA: Insufficient documentation

## 2020-03-18 DIAGNOSIS — Z87891 Personal history of nicotine dependence: Secondary | ICD-10-CM | POA: Diagnosis not present

## 2020-03-18 DIAGNOSIS — Z4901 Encounter for fitting and adjustment of extracorporeal dialysis catheter: Secondary | ICD-10-CM | POA: Diagnosis present

## 2020-03-18 DIAGNOSIS — E1122 Type 2 diabetes mellitus with diabetic chronic kidney disease: Secondary | ICD-10-CM | POA: Diagnosis not present

## 2020-03-18 DIAGNOSIS — Z992 Dependence on renal dialysis: Secondary | ICD-10-CM

## 2020-03-18 DIAGNOSIS — I132 Hypertensive heart and chronic kidney disease with heart failure and with stage 5 chronic kidney disease, or end stage renal disease: Secondary | ICD-10-CM | POA: Insufficient documentation

## 2020-03-18 DIAGNOSIS — I509 Heart failure, unspecified: Secondary | ICD-10-CM | POA: Diagnosis not present

## 2020-03-18 HISTORY — PX: DIALYSIS/PERMA CATHETER REMOVAL: CATH118289

## 2020-03-18 SURGERY — DIALYSIS/PERMA CATHETER REMOVAL
Anesthesia: LOCAL

## 2020-03-18 SURGICAL SUPPLY — 2 items
FORCEPS HALSTEAD CVD 5IN STRL (INSTRUMENTS) ×1 IMPLANT
TRAY LACERAT/PLASTIC (MISCELLANEOUS) ×1 IMPLANT

## 2020-03-18 NOTE — Discharge Instructions (Signed)
Tunneled Catheter Removal, Care After Refer to this sheet in the next few weeks. These instructions provide you with information about caring for yourself after your procedure. Your health care provider may also give you more specific instructions. Your treatment has been planned according to current medical practices, but problems sometimes occur. Call your health care provider if you have any problems or questions after your procedure. What can I expect after the procedure? After the procedure, it is common to have: Some mild redness, swelling, and pain around your catheter site.   Follow these instructions at home: Incision care  Check your removal site  every day for signs of infection. Check for: More redness, swelling, or pain. More fluid or blood. Warmth. Pus or a bad smell. Remove your dressing in 48hrs leave open to air  Activity  Return to your normal activities as told by your health care provider. Ask your health care provider what activities are safe for you. Do not lift anything that is heavier than 10 lb (4.5 kg) for 3 days  You may shower tomorrow  Contact a health care provider if: You have more fluid or blood coming from your removal site You have more redness, swelling, or pain at your incisions or around the area where your catheter was removed Your removal site feel warm to the touch. You feel unusually weak. You feel nauseous.. Get help right away if You have swelling in your arm, shoulder, neck, or face. You develop chest pain. You have difficulty breathing. You feel dizzy or light-headed. You have pus or a bad smell coming from your removal site You have a fever. You develop bleeding from your removal site, and your bleeding does not stop. This information is not intended to replace advice given to you by your health care provider. Make sure you discuss any questions you have with your health care provider. Document Released: 09/19/2012 Document Revised:  06/05/2016 Document Reviewed: 06/29/2015 Elsevier Interactive Patient Education  2017 Elsevier Inc[employer] 

## 2020-03-18 NOTE — Op Note (Signed)
Operative Note  Preoperative diagnosis:    1. ESRD with functional permanent access  Postoperative diagnosis:   1. ESRD with functional permanent access  Procedure:  Removal of Right Permcath  Performing Clinician: Hezzie Bump PA-C  Surgeon:  [name], MD  Anesthesia:  Local  EBL:  Minimal  Indication for the Procedure:  The patient has a functional permanent dialysis access and no longer needs their permcath.  This can be removed.  Risks and benefits are discussed and informed consent is obtained.  Description of the Procedure:  The patient's right neck, chest and existing catheter were sterilely prepped and draped. The area around the catheter was anesthetized copiously with 1% lidocaine. The catheter was dissected out with curved hemostats until the cuff was freed from the surrounding fibrous sheath. The fiber sheath was transected, and the catheter was then removed in its entirety using gentle traction. Pressure was held and sterile dressings were placed. The patient tolerated the procedure well and was taken to the recovery room in stable condition.  [name] A [name]  03/18/2020, 2:49 PM   This note was created with Dragon Medical transcription system. Any errors in dictation are purely unintentional.

## 2020-03-18 NOTE — H&P (Signed)
[facility] Admission History & Physical  MRN : [id number]  [name] is a 34 y.o. ([birth year]) female who presents with chief complaint of scheduled PermCath removal.  History of Present Illness:  The patient is a 34 year old female with multiple medical issues including end-stage renal disease is currently maintained via peritoneal dialysis.  Patient endorses a history of peritoneal dialysis catheter placement and August 2020.  The PD catheter is functioning appropriately and therefore she is not in need of a PermCath anymore.  She presents today for PermCath removal.  Denies any fever, nausea, vomiting or abdominal pain.  Denies any changes in her bowel habits.  Denies any shortness of breath or chest pain.  No current facility-administered medications for this encounter.   Past Medical History:  Diagnosis Date  . Anemia of chronic disease   . Asthma   . Calciphylaxis 2020  . CHF (congestive heart failure) ([place])   . CKD (chronic kidney disease), stage III   . Diabetes ([place])    type 2  . Hypertension   . Morbid obesity ([place])    Past Surgical History:  Procedure Laterality Date  . APPLICATION OF WOUND VAC Bilateral 09/15/2019   Procedure: APPLICATION OF WOUND VAC;  Surgeon: Benjamine Sprague, DO;  Location: [facility];  Service: General;  Laterality: Bilateral;  . DIALYSIS/PERMA CATHETER INSERTION N/A 02/28/2019   Procedure: DIALYSIS/PERMA CATHETER INSERTION;  Surgeon: Algernon Huxley, MD;  Location: [place];  Service: Cardiovascular;  Laterality: N/A;  . DIALYSIS/PERMA CATHETER INSERTION N/A 09/17/2019   Procedure: DIALYSIS/PERMA CATHETER EXCHANGE;  Surgeon: Katha Cabal, MD;  Location: [place] [facility];  Service: Cardiovascular;  Laterality: N/A;  . pd cath    . TONSILLECTOMY    . WOUND DEBRIDEMENT Bilateral 09/15/2019   Procedure: DEBRIDEMENT WOUND;  Surgeon: Benjamine Sprague, DO;  Location: [facility];  Service: General;  Laterality:  Bilateral;   Social History Social History   Tobacco Use  . Smoking status: Former Smoker    Types: Cigars  . Smokeless tobacco: Never Used  Substance Use Topics  . Alcohol use: No    Alcohol/week: 0.0 standard drinks  . Drug use: No   Family History Family History  Problem Relation Age of Onset  . Hypertension Mother   . Multiple sclerosis Mother   . CAD Mother   . Cancer Maternal Grandmother        Ovarian   . Cancer Maternal Grandfather        Prostate   . Kidney disease Father   Denies family history of peripheral artery disease, venous disease or bleeding/clotting disorders.  No Known Allergies  REVIEW OF SYSTEMS (Negative unless checked)  Constitutional: [] Weight loss  [] Fever  [] Chills Cardiac: [] Chest pain   [] Chest pressure   [] Palpitations   [] Shortness of breath when laying flat   [] Shortness of breath at rest   [] Shortness of breath with exertion. Vascular:  [] Pain in legs with walking   [] Pain in legs at rest   [] Pain in legs when laying flat   [] Claudication   [] Pain in feet when walking  [] Pain in feet at rest  [] Pain in feet when laying flat   [] History of DVT   [] Phlebitis   [] Swelling in legs   [] Varicose veins   [] Non-healing ulcers Pulmonary:   [] Uses home oxygen   [] Productive cough   [] Hemoptysis   [] Wheeze  [] COPD   [] Asthma Neurologic:  [] Dizziness  [] Blackouts   [] Seizures   [] History  of stroke   [] History of TIA  [] Aphasia   [] Temporary blindness   [] Dysphagia   [] Weakness or numbness in arms   [] Weakness or numbness in legs Musculoskeletal:  [] Arthritis   [] Joint swelling   [] Joint pain   [] Low back pain Hematologic:  [] Easy bruising  [] Easy bleeding   [] Hypercoagulable state   [x] Anemic  [] Hepatitis Gastrointestinal:  [] Blood in stool   [] Vomiting blood  [] Gastroesophageal reflux/heartburn   [] Difficulty swallowing. Genitourinary:  [x] Chronic kidney disease   [] Difficult urination  [] Frequent urination  [] Burning with urination   [] Blood in  urine Skin:  [] Rashes   [] Ulcers   [] Wounds Psychological:  [] History of anxiety   []  History of major depression.  Physical Examination  Vitals:   03/18/20 1245 03/18/20 1257  BP: (!) 176/114 (!) 181/108  Resp: 16   Temp: 98.6 F (37 C)   SpO2: 92%   Weight: 122 kg   Height: 5\' 6"  (1.676 m)    Body mass index is 43.41 kg/m. Gen: WD/WN, NAD Head: [place]/AT, No temporalis wasting.  Ear/Nose/Throat: Hearing grossly intact, nares w/o erythema or drainage, oropharynx w/o Erythema/Exudate, Eyes: Sclera non-icteric, conjunctiva clear Neck: Supple, no nuchal rigidity.  No JVD.  Pulmonary:  Good air movement, no increased work of respiration or use of accessory muscles  Cardiac: RRR, normal S1, S2, no Murmurs, rubs or gallops. Vascular:  Vessel Right Left  Radial Palpable Palpable  Ulnar Palpable Palpable  Brachial Palpable Palpable  Carotid Palpable, without bruit Palpable, without bruit  Aorta Not palpable N/A  Femoral Palpable Palpable  Popliteal Palpable Palpable  PT Palpable Palpable  DP Palpable Palpable   Gastrointestinal: soft, non-tender/non-distended. No guarding/reflex. No masses, surgical incisions, or scars. Musculoskeletal: M/S 5/5 throughout.  No deformity or atrophy.  Mild edema Neurologic: Sensation grossly intact in extremities.  Symmetrical.  Speech is fluent. Motor exam as listed above. Psychiatric: Judgment intact, Mood & affect appropriate for pt's clinical situation. Dermatologic: No rashes or ulcers noted.  No cellulitis or open wounds. Lymph : No Cervical, Axillary, or Inguinal lymphadenopathy.  CBC Lab Results  Component Value Date   WBC 7.8 09/17/2019   HGB 7.3 (L) 01/30/2020   HCT 22.6 (L) 01/30/2020   MCV 96.6 09/17/2019   PLT 291 09/17/2019   BMET    Component Value Date/Time   NA 139 09/17/2019 0614   NA 135 (L) 12/31/2013 1020   K 5.1 09/17/2019 0614   K 3.9 12/31/2013 1020   CL 107 09/17/2019 0614   CL 105 12/31/2013 1020   CO2 19 (L)  09/17/2019 0614   CO2 26 12/31/2013 1020   GLUCOSE 92 09/17/2019 0614   GLUCOSE 243 (H) 12/31/2013 1020   BUN 79 (H) 09/17/2019 0614   BUN 19 (H) 12/31/2013 1020   CREATININE 12.92 (H) 09/17/2019 0614   CREATININE 2.12 (H) 12/31/2013 1020   CALCIUM 7.8 (L) 09/17/2019 0614   CALCIUM 8.5 12/31/2013 1020   GFRNONAA 3 (L) 09/17/2019 0614   GFRNONAA 31 (L) 12/31/2013 1020   GFRAA 4 (L) 09/17/2019 0614   GFRAA 36 (L) 12/31/2013 1020   CrCl cannot be calculated (Patient's most recent lab result is older than the maximum 21 days allowed.).  COAG Lab Results  Component Value Date   INR 1.1 07/02/2019   INR 1.16 02/12/2016   Radiology No results found.  Assessment/Plan The patient is a 34 year old female with multiple medical issues including end-stage renal disease is currently maintained via peritoneal dialysis.  1. ESRD: Patient is  currently being maintained via a functioning peritoneal dialysis catheter. The patient is not no longer need of her PermCath. Will remove the patient's PermCath. Procedure, risks and benefits were explained to the patient. All questions answered. Patient wishes to proceed.  2.  Anemia of chronic disease: Asymptomatic at this time This is followed by the patient's nephrologist and primary care physician  3.  Hypertension: On appropriate medications. Encouraged good control as its slows the progression of atherosclerotic disease.  Discussed with Dr. Mayme Genta, PA-C  03/18/2020 1:48 PM

## 2020-03-19 ENCOUNTER — Encounter: Payer: Self-pay | Admitting: Cardiology

## 2020-05-14 ENCOUNTER — Other Ambulatory Visit (INDEPENDENT_AMBULATORY_CARE_PROVIDER_SITE_OTHER): Payer: Self-pay

## 2020-05-14 ENCOUNTER — Encounter (INDEPENDENT_AMBULATORY_CARE_PROVIDER_SITE_OTHER): Payer: Self-pay

## 2020-05-14 ENCOUNTER — Ambulatory Visit (INDEPENDENT_AMBULATORY_CARE_PROVIDER_SITE_OTHER): Payer: Self-pay | Admitting: Nurse Practitioner

## 2020-05-28 ENCOUNTER — Ambulatory Visit (INDEPENDENT_AMBULATORY_CARE_PROVIDER_SITE_OTHER): Payer: Medicare Other

## 2020-05-28 ENCOUNTER — Ambulatory Visit (INDEPENDENT_AMBULATORY_CARE_PROVIDER_SITE_OTHER): Payer: Medicare Other | Admitting: Nurse Practitioner

## 2020-05-28 ENCOUNTER — Other Ambulatory Visit: Payer: Self-pay

## 2020-05-28 ENCOUNTER — Encounter (INDEPENDENT_AMBULATORY_CARE_PROVIDER_SITE_OTHER): Payer: Self-pay | Admitting: Nurse Practitioner

## 2020-05-28 VITALS — BP 145/86 | HR 94 | Ht 66.0 in | Wt 299.0 lb

## 2020-05-28 DIAGNOSIS — N186 End stage renal disease: Secondary | ICD-10-CM | POA: Diagnosis not present

## 2020-05-28 DIAGNOSIS — I10 Essential (primary) hypertension: Secondary | ICD-10-CM | POA: Diagnosis not present

## 2020-06-02 ENCOUNTER — Encounter (INDEPENDENT_AMBULATORY_CARE_PROVIDER_SITE_OTHER): Payer: Self-pay | Admitting: Nurse Practitioner

## 2020-06-02 NOTE — Progress Notes (Signed)
Subjective:    Patient ID: [name], female    DOB: 1985-12-27, 34 y.o.   MRN: [id number] Chief Complaint  Patient presents with  . Follow-up    vein mapping     The patient is seen for evaluation of dialysis access.  Currently the patient is maintained via peritoneal dialysis catheter, however she has had difficulties in the past and her nephrologist would like her to have an AV fistula placed for when hemodialysis may be necessary.  Current access is via a PD catheter is functioning appropriately.  The patient also continues to produce urine.    The patient denies amaurosis fugax or recent TIA symptoms. There are no recent neurological changes noted. The patient denies claudication symptoms or rest pain symptoms. The patient denies history of DVT, PE or superficial thrombophlebitis. The patient denies recent episodes of angina or shortness of breath.   Today the patient has adequate access for a left brachiocephalic AV fistula.   Review of Systems  Respiratory: Negative for shortness of breath.   Cardiovascular: Negative for chest pain and leg swelling.  All other systems reviewed and are negative.      Objective:   Physical Exam Vitals reviewed.  HENT:     Head: Normocephalic.  Cardiovascular:     Rate and Rhythm: Normal rate and regular rhythm.     Pulses:          Radial pulses are 2+ on the right side and 2+ on the left side.  Pulmonary:     Effort: Pulmonary effort is normal.  Skin:    General: Skin is warm and dry.  Neurological:     Mental Status: She is alert and oriented to person, place, and time.  Psychiatric:        Mood and Affect: Mood normal.        Behavior: Behavior normal.        Thought Content: Thought content normal.        Judgment: Judgment normal.     BP (!) 145/86   Pulse 94   Ht 5\' 6"  (1.676 m)   Wt 299 lb (135.6 kg)   BMI 48.26 kg/m   Past Medical History:  Diagnosis Date  . Anemia of chronic disease   . Asthma   .  Calciphylaxis 2020  . CHF (congestive heart failure) ([place])   . CKD (chronic kidney disease), stage III   . Diabetes ([place])    type 2  . Hypertension   . Morbid obesity ([place])     Social History   Socioeconomic History  . Marital status: Single    Spouse name: Not on file  . Number of children: Not on file  . Years of education: Not on file  . Highest education level: Not on file  Occupational History  . Occupation: works at [employer]  . Smoking status: Former Smoker    Types: Cigars  . Smokeless tobacco: Never Used  Substance and Sexual Activity  . Alcohol use: No    Alcohol/week: 0.0 standard drinks  . Drug use: No  . Sexual activity: Yes    Birth control/protection: None  Other Topics Concern  . Not on file  Social History Narrative  . Not on file   Social Determinants of Health   Financial Resource Strain:   . Difficulty of Paying Living Expenses:   Food Insecurity:   . Worried About Charity fundraiser in the Last Year:   .  Ran Out of Food in the Last Year:   Transportation Needs:   . Film/video editor (Medical):   Marland Kitchen Lack of Transportation (Non-Medical):   Physical Activity:   . Days of Exercise per Week:   . Minutes of Exercise per Session:   Stress:   . Feeling of Stress :   Social Connections:   . Frequency of Communication with Friends and Family:   . Frequency of Social Gatherings with Friends and Family:   . Attends Religious Services:   . Active Member of Clubs or Organizations:   . Attends Archivist Meetings:   Marland Kitchen Marital Status:   Intimate Partner Violence:   . Fear of Current or Ex-Partner:   . Emotionally Abused:   Marland Kitchen Physically Abused:   . Sexually Abused:     Past Surgical History:  Procedure Laterality Date  . APPLICATION OF WOUND VAC Bilateral 09/15/2019   Procedure: APPLICATION OF WOUND VAC;  Surgeon: Benjamine Sprague, DO;  Location: [facility];  Service: General;  Laterality: Bilateral;  . DIALYSIS/PERMA  CATHETER INSERTION N/A 02/28/2019   Procedure: DIALYSIS/PERMA CATHETER INSERTION;  Surgeon: Algernon Huxley, MD;  Location: [facility];  Service: Cardiovascular;  Laterality: N/A;  . DIALYSIS/PERMA CATHETER INSERTION N/A 09/17/2019   Procedure: DIALYSIS/PERMA CATHETER EXCHANGE;  Surgeon: Katha Cabal, MD;  Location: [facility];  Service: Cardiovascular;  Laterality: N/A;  . DIALYSIS/PERMA CATHETER REMOVAL N/A 03/18/2020   Procedure: DIALYSIS/PERMA CATHETER REMOVAL;  Surgeon: Algernon Huxley, MD;  Location: [facility];  Service: Cardiovascular;  Laterality: N/A;  . pd cath    . TONSILLECTOMY    . WOUND DEBRIDEMENT Bilateral 09/15/2019   Procedure: DEBRIDEMENT WOUND;  Surgeon: Benjamine Sprague, DO;  Location: [facility];  Service: General;  Laterality: Bilateral;    Family History  Problem Relation Age of Onset  . Hypertension Mother   . Multiple sclerosis Mother   . CAD Mother   . Cancer Maternal Grandmother        Ovarian   . Cancer Maternal Grandfather        Prostate   . Kidney disease Father     Allergies  Allergen Reactions  . Tomato (Diagnostic) Itching       Assessment & Plan:   1. ESRD (end stage renal disease) ([place]) Recommend:  At this time the patient does not have appropriate extremity access for dialysis, although she currently uses PD at this time.   Patient should have a left brachiocephalic AV fistula  created.  The risks, benefits and alternative therapies were reviewed in detail with the patient.  All questions were answered.  The patient agrees to proceed with surgery.    2. Essential hypertension Continue antihypertensive medications as already ordered, these medications have been reviewed and there are no changes at this time.    Current Outpatient Medications on File Prior to Visit  Medication Sig Dispense Refill  . amLODipine (NORVASC) 10 MG tablet Take 10 mg by mouth daily.    Marland Kitchen aspirin EC 81 MG EC tablet Take 1 tablet (81 mg  total) by mouth daily. 30 tablet 2  . carvedilol (COREG) 25 MG tablet Take 1 tablet (25 mg total) by mouth 2 (two) times daily with a meal. 60 tablet 5  . cloNIDine (CATAPRES) 0.1 MG tablet Take 0.1 mg by mouth 3 (three) times daily.    . ferric citrate (AURYXIA) 1 GM 210 MG(Fe) tablet Take 420 mg by mouth 3 (three) times  daily.    . ferrous gluconate (FERGON) 324 MG tablet Take 324 mg by mouth daily.    Marland Kitchen gentamicin cream (GARAMYCIN) 0.1 % APPLY TO EXIT SITE DAILY    . isosorbide mononitrate (IMDUR) 30 MG 24 hr tablet Take 1 tablet (30 mg total) by mouth 2 (two) times daily. 60 tablet 2  . losartan (COZAAR) 100 MG tablet Take 100 mg by mouth every evening.     . multivitamin (RENA-VIT) TABS tablet Take 1 tablet by mouth at bedtime. 30 tablet 0  . Potassium Chloride ER 20 MEQ TBCR Take 1 tablet by mouth daily.    . sodium thiosulfate 25 % injection Inject 12.5 g into the vein once.    . torsemide (DEMADEX) 20 MG tablet Take 40 mg by mouth daily.     . vitamin C (VITAMIN C) 500 MG tablet Take 1 tablet (500 mg total) by mouth 2 (two) times daily. 60 tablet 1  . gabapentin (NEURONTIN) 100 MG capsule Take 100 mg by mouth at bedtime. (Patient not taking: Reported on 05/28/2020)     No current facility-administered medications on file prior to visit.    There are no Patient Instructions on file for this visit. No follow-ups on file.   Kris Hartmann, NP

## 2020-06-04 ENCOUNTER — Telehealth (INDEPENDENT_AMBULATORY_CARE_PROVIDER_SITE_OTHER): Payer: Self-pay

## 2020-06-04 NOTE — Telephone Encounter (Signed)
Patient called back and is now scheduled with Dr. [name] for a left brachial cephalica AVF surgery on 49/70/26 at the [facility]. Phone call pre-op between 1-5 pm and covid testing on 06/23/20 between 8-1 pm at the [facility]. Pre-surgical instructions were discussed and will be mailed. Patient was offered 06/11/20, 06/17/20  and 06/18/20 as well and declined them all.

## 2020-06-04 NOTE — Telephone Encounter (Signed)
I attempted to contact the patient to schedule her for surgery and the phone only hung up after a couple of rings, I was unable to leave a message.

## 2020-06-11 ENCOUNTER — Other Ambulatory Visit (INDEPENDENT_AMBULATORY_CARE_PROVIDER_SITE_OTHER): Payer: Self-pay | Admitting: Nurse Practitioner

## 2020-06-12 ENCOUNTER — Other Ambulatory Visit: Payer: Self-pay

## 2020-06-12 ENCOUNTER — Encounter
Admission: RE | Admit: 2020-06-12 | Discharge: 2020-06-12 | Disposition: A | Discharge status: Home / Self Care | Payer: Medicare Other | Source: Ambulatory Visit | Attending: Vascular Surgery | Admitting: Vascular Surgery

## 2020-06-12 NOTE — Patient Instructions (Addendum)
Your procedure is scheduled on:Wed. 9/8 Report to Day Surgery. To find out your arrival time please call [phone] between 1PM - 3PM on .Tues 9/7  Remember: Instructions that are not followed completely may result in serious medical risk,  up to and including death, or upon the discretion of your surgeon and anesthesiologist your  surgery may need to be rescheduled.     _X__ 1. Do not eat food after midnight the night before your procedure.                 No chewing gum or hard candies. You may drink clear liquids up to 2 hours                 before you are scheduled to arrive for your surgery- DO not drink clear                 liquids within 2 hours of the start of your surgery.                 Clear Liquids include:  water,  G2 or                  Gatorade Zero (avoid Red/Purple/Blue), Black Coffee or Tea (Do not add                 anything to coffee or tea). _____2.   Complete the "Ensure Clear Pre-surgery Clear Carbohydrate Drink" provided to you, 2 hours before arrival. **If you       are diabetic you will be provided with an alternative drink, Gatorade Zero or G2.  __X__2.  On the morning of surgery brush your teeth with toothpaste and water, you                may rinse your mouth with mouthwash if you wish.  Do not swallow any toothpaste of mouthwash.     ___ 3.  No Alcohol for 24 hours before or after surgery.   ___ 4.  Do Not Smoke or use e-cigarettes For 24 Hours Prior to Your Surgery.                 Do not use any chewable tobacco products for at least 6 hours prior to                 Surgery.  ___  5.  Do not use any recreational drugs (marijuana, cocaine, heroin, ecstasy, MDMA or other)                For at least one week prior to your surgery.  Combination of these drugs with anesthesia                May have life threatening results.  ____  6.  Bring all medications with you on the day of surgery if instructed.   _x___  7.  Notify  your doctor if there is any change in your medical condition      (cold, fever, infections).     Do not wear jewelry, make-up, hairpins, clips or nail polish. Do not wear lotions, powders, or perfumes.  Do not shave 48 hours prior to surgery. Do not bring valuables to the hospital.    [facility] is not responsible for any belongings or valuables.  Contacts, dentures or bridgework may not be worn into surgery. Leave your suitcase in the car. After surgery it may be brought to your room. For patients  admitted to the hospital, discharge time is determined by your treatment team.   Patients discharged the day of surgery will not be allowed to drive home.   Make arrangements for someone to be with you for the first 24 hours of your Same Day Discharge.    Please read over the following fact sheets that you were given:      _x___ Take these medicines the morning of surgery with A SIP OF WATER:    1. amLODipine (NORVASC) 10 MG tablet  2. carvedilol (COREG) 25 MG tablet  3. cloNIDine (CATAPRES) 0.1 MG tablet  4.isosorbide mononitrate (IMDUR) 30 MG 24 hr tablet  5.  6.  ____ Fleet Enema (as directed)   __x__ Use CHG Soap (or wipes) as directed  ____ Use Benzoyl Peroxide Gel as instructed  ____ Use inhalers on the day of surgery  ____ Stop metformin 2 days prior to surgery    ____ Take 1/2 of usual insulin dose the night before surgery. No insulin the morning          of surgery.   _x___ continue aspirin.  None the AM of surgery  _x___ Stop Anti-inflammatories Advil PM on 9/1  (takes rarely )May take tylenol PM   _x___ Stop supplements   vitamin C (VITAMIN C) 500 MG tablet on 9/1  ____ Bring C-Pap to the hospital.    If you have any questions regarding your pre-procedure instructions,  Please call Pre-admit Testing at [place]

## 2020-06-16 ENCOUNTER — Encounter: Admission: RE | Admit: 2020-06-16 | Payer: Medicare Other | Source: Ambulatory Visit

## 2020-06-18 ENCOUNTER — Encounter
Admission: RE | Admit: 2020-06-18 | Discharge: 2020-06-18 | Disposition: A | Discharge status: Home / Self Care | Payer: Medicare Other | Source: Ambulatory Visit | Attending: Vascular Surgery | Admitting: Vascular Surgery

## 2020-06-18 ENCOUNTER — Other Ambulatory Visit: Payer: Self-pay

## 2020-06-18 DIAGNOSIS — Z01818 Encounter for other preprocedural examination: Secondary | ICD-10-CM | POA: Insufficient documentation

## 2020-06-18 DIAGNOSIS — Z01810 Encounter for preprocedural cardiovascular examination: Secondary | ICD-10-CM

## 2020-06-18 LAB — BASIC METABOLIC PANEL
Anion gap: 19 — ABNORMAL HIGH (ref 5–15)
BUN: 53 mg/dL — ABNORMAL HIGH (ref 6–20)
CO2: 22 mmol/L (ref 22–32)
Calcium: 7.9 mg/dL — ABNORMAL LOW (ref 8.9–10.3)
Chloride: 97 mmol/L — ABNORMAL LOW (ref 98–111)
Creatinine, Ser: 11.19 mg/dL — ABNORMAL HIGH (ref 0.44–1.00)
GFR calc Af Amer: 5 mL/min — ABNORMAL LOW (ref >60)
GFR calc non Af Amer: 4 mL/min — ABNORMAL LOW (ref >60)
Glucose, Bld: 103 mg/dL — ABNORMAL HIGH (ref 70–99)
Potassium: 3.1 mmol/L — ABNORMAL LOW (ref 3.5–5.1)
Sodium: 138 mmol/L (ref 135–145)

## 2020-06-18 LAB — CBC WITH DIFFERENTIAL/PLATELET
Abs Immature Granulocytes: 0.07 10*3/uL (ref 0.00–0.07)
Basophils Absolute: 0.1 10*3/uL (ref 0.0–0.1)
Basophils Relative: 0 %
Eosinophils Absolute: 0.8 10*3/uL — ABNORMAL HIGH (ref 0.0–0.5)
Eosinophils Relative: 5 %
HCT: 36.3 % (ref 36.0–46.0)
Hemoglobin: 11.8 g/dL — ABNORMAL LOW (ref 12.0–15.0)
Immature Granulocytes: 0 %
Lymphocytes Relative: 14 %
Lymphs Abs: 2.2 10*3/uL (ref 0.7–4.0)
MCH: 30 pg (ref 26.0–34.0)
MCHC: 32.5 g/dL (ref 30.0–36.0)
MCV: 92.4 fL (ref 80.0–100.0)
Monocytes Absolute: 1.1 10*3/uL — ABNORMAL HIGH (ref 0.1–1.0)
Monocytes Relative: 7 %
Neutro Abs: 11.9 10*3/uL — ABNORMAL HIGH (ref 1.7–7.7)
Neutrophils Relative %: 74 %
Platelets: 303 10*3/uL (ref 150–400)
RBC: 3.93 MIL/uL (ref 3.87–5.11)
RDW: 17.7 % — ABNORMAL HIGH (ref 11.5–15.5)
WBC: 16.2 10*3/uL — ABNORMAL HIGH (ref 4.0–10.5)
nRBC: 0 % (ref 0.0–0.2)

## 2020-06-18 LAB — APTT: aPTT: 29 seconds (ref 24–36)

## 2020-06-18 LAB — PROTIME-INR
INR: 1 (ref 0.8–1.2)
Prothrombin Time: 13 seconds (ref 11.4–15.2)

## 2020-06-18 NOTE — Progress Notes (Signed)
  [facility] Perioperative Services: Pre-Admission/Anesthesia Testing  Abnormal Lab Notification    Date: 06/18/20  Name: OLA RAAP MRN:   [id number]  Re: Abnormal labs noted during PAT appointment   Provider(s) Notified: Algernon Huxley, MD and Marcelle Overlie, [place] Notification mode: Routed and/or faxed via [place] LAB VALUE(S): Lab Results  Component Value Date   WBC 16.2 (H) 06/18/2020   K 3.1 (L) 06/18/2020   BUN 53 (H) 06/18/2020   CREATININE 11.19 (H) 06/18/2020   CALCIUM 7.9 (L) 06/18/2020    Notes: Patient scheduled for venous fistula placement on 06/24/2020. Sending to surgeon and his APP for review. This is a Community education officer; no formal response is required.  Honor Loh, MSN, APRN, FNP-C, CEN [facility]  Peri-operative Services Nurse Practitioner Phone: [phone] 06/18/20 1:12 PM

## 2020-06-18 NOTE — H&P (View-Only) (Signed)
  [facility] Perioperative Services: Pre-Admission/Anesthesia Testing  Abnormal Lab Notification    Date: 06/18/20  Name: [name] MRN:   [id number]  Re: Abnormal labs noted during PAT appointment   Provider(s) Notified: Algernon Huxley, MD and Marcelle Overlie, [place] Notification mode: Routed and/or faxed via [place] LAB VALUE(S): Lab Results  Component Value Date   WBC 16.2 (H) 06/18/2020   K 3.1 (L) 06/18/2020   BUN 53 (H) 06/18/2020   CREATININE 11.19 (H) 06/18/2020   CALCIUM 7.9 (L) 06/18/2020    Notes: Patient scheduled for venous fistula placement on 06/24/2020. Sending to surgeon and his APP for review. This is a Community education officer; no formal response is required.  Honor Loh, MSN, APRN, FNP-C, CEN [facility]  Peri-operative Services Nurse Practitioner Phone: [phone] 06/18/20 1:12 PM

## 2020-06-21 LAB — TYPE AND SCREEN
ABO/RH(D): A POS
Antibody Screen: NEGATIVE

## 2020-06-23 ENCOUNTER — Telehealth (INDEPENDENT_AMBULATORY_CARE_PROVIDER_SITE_OTHER): Payer: Self-pay

## 2020-06-23 ENCOUNTER — Other Ambulatory Visit: Admission: RE | Admit: 2020-06-23 | Payer: Medicare Other | Source: Ambulatory Visit

## 2020-06-23 NOTE — Telephone Encounter (Signed)
Patient called to reschedule her surgery form 06/24/20 to 07/09/20. Patient has been rescheduled and will do her covid test on 07/07/20 between 8-1 pm at the [facility]. Pre-surgical instructions will be mailed.

## 2020-07-06 ENCOUNTER — Other Ambulatory Visit (INDEPENDENT_AMBULATORY_CARE_PROVIDER_SITE_OTHER): Payer: Self-pay | Admitting: Nurse Practitioner

## 2020-07-07 ENCOUNTER — Other Ambulatory Visit: Payer: Self-pay

## 2020-07-07 ENCOUNTER — Other Ambulatory Visit
Admission: RE | Admit: 2020-07-07 | Discharge: 2020-07-07 | Disposition: A | Discharge status: Home / Self Care | Payer: Medicare Other | Source: Ambulatory Visit | Attending: Vascular Surgery | Admitting: Vascular Surgery

## 2020-07-07 DIAGNOSIS — Z20822 Contact with and (suspected) exposure to covid-19: Secondary | ICD-10-CM | POA: Insufficient documentation

## 2020-07-07 DIAGNOSIS — Z01812 Encounter for preprocedural laboratory examination: Secondary | ICD-10-CM | POA: Insufficient documentation

## 2020-07-08 LAB — SARS CORONAVIRUS 2 (TAT 6-24 HRS): SARS Coronavirus 2: NEGATIVE

## 2020-07-09 ENCOUNTER — Other Ambulatory Visit: Payer: Self-pay

## 2020-07-09 ENCOUNTER — Ambulatory Visit: Payer: Medicare Other

## 2020-07-09 ENCOUNTER — Encounter
Admission: RE | Disposition: A | Discharge status: Home / Self Care | Payer: Self-pay | Source: Home / Self Care | Attending: Vascular Surgery

## 2020-07-09 ENCOUNTER — Ambulatory Visit
Admission: RE | Admit: 2020-07-09 | Discharge: 2020-07-09 | Disposition: A | Discharge status: Home / Self Care | Payer: Medicare Other | Attending: Vascular Surgery | Admitting: Vascular Surgery

## 2020-07-09 ENCOUNTER — Encounter: Payer: Self-pay | Admitting: Vascular Surgery

## 2020-07-09 DIAGNOSIS — Z8249 Family history of ischemic heart disease and other diseases of the circulatory system: Secondary | ICD-10-CM | POA: Insufficient documentation

## 2020-07-09 DIAGNOSIS — Z841 Family history of disorders of kidney and ureter: Secondary | ICD-10-CM | POA: Diagnosis not present

## 2020-07-09 DIAGNOSIS — E1122 Type 2 diabetes mellitus with diabetic chronic kidney disease: Secondary | ICD-10-CM | POA: Insufficient documentation

## 2020-07-09 DIAGNOSIS — Z6841 Body Mass Index (BMI) 40.0 and over, adult: Secondary | ICD-10-CM | POA: Diagnosis not present

## 2020-07-09 DIAGNOSIS — D631 Anemia in chronic kidney disease: Secondary | ICD-10-CM | POA: Insufficient documentation

## 2020-07-09 DIAGNOSIS — Z87891 Personal history of nicotine dependence: Secondary | ICD-10-CM | POA: Insufficient documentation

## 2020-07-09 DIAGNOSIS — I509 Heart failure, unspecified: Secondary | ICD-10-CM | POA: Diagnosis not present

## 2020-07-09 DIAGNOSIS — Z79899 Other long term (current) drug therapy: Secondary | ICD-10-CM | POA: Insufficient documentation

## 2020-07-09 DIAGNOSIS — N185 Chronic kidney disease, stage 5: Secondary | ICD-10-CM | POA: Diagnosis not present

## 2020-07-09 DIAGNOSIS — Z7982 Long term (current) use of aspirin: Secondary | ICD-10-CM | POA: Insufficient documentation

## 2020-07-09 DIAGNOSIS — I132 Hypertensive heart and chronic kidney disease with heart failure and with stage 5 chronic kidney disease, or end stage renal disease: Secondary | ICD-10-CM | POA: Insufficient documentation

## 2020-07-09 DIAGNOSIS — N186 End stage renal disease: Secondary | ICD-10-CM | POA: Diagnosis present

## 2020-07-09 DIAGNOSIS — Z992 Dependence on renal dialysis: Secondary | ICD-10-CM | POA: Insufficient documentation

## 2020-07-09 HISTORY — PX: AV FISTULA PLACEMENT: SHX1204

## 2020-07-09 LAB — POCT I-STAT, CHEM 8
BUN: 51 mg/dL — ABNORMAL HIGH (ref 6–20)
Calcium, Ion: 0.96 mmol/L — ABNORMAL LOW (ref 1.15–1.40)
Chloride: 98 mmol/L (ref 98–111)
Creatinine, Ser: 12.3 mg/dL — ABNORMAL HIGH (ref 0.44–1.00)
Glucose, Bld: 111 mg/dL — ABNORMAL HIGH (ref 70–99)
HCT: 41 % (ref 36.0–46.0)
Hemoglobin: 13.9 g/dL (ref 12.0–15.0)
Potassium: 3.5 mmol/L (ref 3.5–5.1)
Sodium: 138 mmol/L (ref 135–145)
TCO2: 22 mmol/L (ref 22–32)

## 2020-07-09 LAB — GLUCOSE, CAPILLARY: Glucose-Capillary: 90 mg/dL (ref 70–99)

## 2020-07-09 LAB — TYPE AND SCREEN
ABO/RH(D): A POS
Antibody Screen: NEGATIVE

## 2020-07-09 LAB — POCT PREGNANCY, URINE: Preg Test, Ur: NEGATIVE

## 2020-07-09 SURGERY — ARTERIOVENOUS (AV) FISTULA CREATION
Anesthesia: General | Laterality: Left

## 2020-07-09 MED ORDER — ORAL CARE MOUTH RINSE: 15.0000 mL | Freq: Once | OROMUCOSAL | Status: AC

## 2020-07-09 MED ORDER — CEFAZOLIN SODIUM-DEXTROSE 2-4 GM/100ML-% IV SOLN
INTRAVENOUS | Status: AC
  Filled 2020-07-09: qty 100

## 2020-07-09 MED ORDER — SODIUM CHLORIDE FLUSH 0.9 % IV SOLN
INTRAVENOUS | Status: AC
  Filled 2020-07-09: qty 10

## 2020-07-09 MED ORDER — CEFAZOLIN SODIUM-DEXTROSE 1-4 GM/50ML-% IV SOLN
1.0000 g | INTRAVENOUS | Status: AC
  Administered 2020-07-09: 1 g via INTRAVENOUS

## 2020-07-09 MED ORDER — CHLORHEXIDINE GLUCONATE CLOTH 2 % EX PADS: 6.0000 | MEDICATED_PAD | Freq: Once | CUTANEOUS | Status: DC

## 2020-07-09 MED ORDER — FAMOTIDINE 20 MG PO TABS: 20.0000 mg | ORAL_TABLET | Freq: Once | ORAL | Status: AC

## 2020-07-09 MED ORDER — HYDROCODONE-ACETAMINOPHEN 5-325 MG PO TABS
1.0000 | ORAL_TABLET | Freq: Four times a day (QID) | ORAL | 0 refills | Status: DC | PRN
Start: 2020-07-09 — End: 2020-10-20

## 2020-07-09 MED ORDER — OXYCODONE HCL 5 MG PO TABS
ORAL_TABLET | ORAL | Status: AC
  Filled 2020-07-09: qty 1

## 2020-07-09 MED ORDER — PROMETHAZINE HCL 25 MG/ML IJ SOLN: 6.2500 mg | INTRAMUSCULAR | Status: DC | PRN

## 2020-07-09 MED ORDER — FENTANYL CITRATE (PF) 250 MCG/5ML IJ SOLN
INTRAMUSCULAR | Status: DC | PRN
Start: 2020-07-09 — End: 2020-07-09
  Administered 2020-07-09: 100 ug via INTRAVENOUS

## 2020-07-09 MED ORDER — DEXAMETHASONE SODIUM PHOSPHATE 10 MG/ML IJ SOLN
INTRAMUSCULAR | Status: DC | PRN
  Administered 2020-07-09: 5 mg via INTRAVENOUS

## 2020-07-09 MED ORDER — CHLORHEXIDINE GLUCONATE 0.12 % MT SOLN: 15.0000 mL | Freq: Once | OROMUCOSAL | Status: AC

## 2020-07-09 MED ORDER — FAMOTIDINE 20 MG PO TABS
ORAL_TABLET | ORAL | Status: AC
  Administered 2020-07-09: 20 mg via ORAL
  Filled 2020-07-09: qty 1

## 2020-07-09 MED ORDER — MIDAZOLAM HCL 2 MG/2ML IJ SOLN
INTRAMUSCULAR | Status: DC | PRN
  Administered 2020-07-09: 2 mg via INTRAVENOUS

## 2020-07-09 MED ORDER — SODIUM CHLORIDE 0.9 % IV SOLN: INTRAVENOUS | Status: DC

## 2020-07-09 MED ORDER — PROPOFOL 10 MG/ML IV BOLUS
INTRAVENOUS | Status: AC
  Filled 2020-07-09: qty 40

## 2020-07-09 MED ORDER — PROMETHAZINE HCL 25 MG/ML IJ SOLN
INTRAMUSCULAR | Status: AC
  Administered 2020-07-09: 6.25 mg via INTRAVENOUS
  Filled 2020-07-09: qty 1

## 2020-07-09 MED ORDER — SODIUM CHLORIDE 0.9 % IV SOLN
INTRAVENOUS | Status: DC | PRN
  Administered 2020-07-09: 40 mL via INTRAMUSCULAR

## 2020-07-09 MED ORDER — ROCURONIUM BROMIDE 100 MG/10ML IV SOLN
INTRAVENOUS | Status: DC | PRN
  Administered 2020-07-09: 20 mg via INTRAVENOUS
  Administered 2020-07-09: 10 mg via INTRAVENOUS

## 2020-07-09 MED ORDER — FENTANYL CITRATE (PF) 100 MCG/2ML IJ SOLN
INTRAMUSCULAR | Status: AC
  Administered 2020-07-09: 50 ug via INTRAVENOUS
  Filled 2020-07-09: qty 2

## 2020-07-09 MED ORDER — LIDOCAINE HCL 4 % MT SOLN
OROMUCOSAL | Status: DC | PRN
  Administered 2020-07-09: 4 mL via TOPICAL

## 2020-07-09 MED ORDER — FENTANYL CITRATE (PF) 100 MCG/2ML IJ SOLN
INTRAMUSCULAR | Status: AC
  Administered 2020-07-09: 25 ug via INTRAVENOUS
  Filled 2020-07-09: qty 2

## 2020-07-09 MED ORDER — ACETAMINOPHEN 10 MG/ML IV SOLN
INTRAVENOUS | Status: DC | PRN
  Administered 2020-07-09: 1000 mg via INTRAVENOUS

## 2020-07-09 MED ORDER — DEXAMETHASONE SODIUM PHOSPHATE 10 MG/ML IJ SOLN
INTRAMUSCULAR | Status: AC
  Filled 2020-07-09: qty 1

## 2020-07-09 MED ORDER — HEPARIN SODIUM (PORCINE) 5000 UNIT/ML IJ SOLN
INTRAMUSCULAR | Status: AC
  Filled 2020-07-09: qty 1

## 2020-07-09 MED ORDER — PAPAVERINE HCL 30 MG/ML IJ SOLN
INTRAMUSCULAR | Status: AC
  Filled 2020-07-09: qty 2

## 2020-07-09 MED ORDER — PROPOFOL 10 MG/ML IV BOLUS
INTRAVENOUS | Status: AC
  Filled 2020-07-09: qty 20

## 2020-07-09 MED ORDER — SUCCINYLCHOLINE CHLORIDE 20 MG/ML IJ SOLN
INTRAMUSCULAR | Status: DC | PRN
  Administered 2020-07-09: 180 mg via INTRAVENOUS

## 2020-07-09 MED ORDER — SUGAMMADEX SODIUM 200 MG/2ML IV SOLN
INTRAVENOUS | Status: DC | PRN
  Administered 2020-07-09: 200 mg via INTRAVENOUS

## 2020-07-09 MED ORDER — CHLORHEXIDINE GLUCONATE 0.12 % MT SOLN
OROMUCOSAL | Status: AC
  Administered 2020-07-09: 15 mL via OROMUCOSAL
  Filled 2020-07-09: qty 15

## 2020-07-09 MED ORDER — PROPOFOL 10 MG/ML IV BOLUS
INTRAVENOUS | Status: DC | PRN
  Administered 2020-07-09: 50 mg via INTRAVENOUS
  Administered 2020-07-09: 200 mg via INTRAVENOUS

## 2020-07-09 MED ORDER — DEXMEDETOMIDINE HCL 200 MCG/2ML IV SOLN
INTRAVENOUS | Status: DC | PRN
  Administered 2020-07-09: 8 ug via INTRAVENOUS

## 2020-07-09 MED ORDER — FENTANYL CITRATE (PF) 100 MCG/2ML IJ SOLN
25.0000 ug | INTRAMUSCULAR | Status: DC | PRN
  Administered 2020-07-09 (×2): 25 ug via INTRAVENOUS

## 2020-07-09 MED ORDER — ONDANSETRON HCL 4 MG/2ML IJ SOLN
INTRAMUSCULAR | Status: DC | PRN
  Administered 2020-07-09: 4 mg via INTRAVENOUS

## 2020-07-09 MED ORDER — LIDOCAINE HCL (PF) 2 % IJ SOLN
INTRAMUSCULAR | Status: AC
  Filled 2020-07-09: qty 5

## 2020-07-09 MED ORDER — OXYCODONE HCL 5 MG PO TABS
5.0000 mg | ORAL_TABLET | Freq: Once | ORAL | Status: AC
  Administered 2020-07-09: 5 mg via ORAL

## 2020-07-09 MED ORDER — KETAMINE HCL 50 MG/ML IJ SOLN
INTRAMUSCULAR | Status: DC | PRN
  Administered 2020-07-09: 50 mg via INTRAMUSCULAR

## 2020-07-09 MED ORDER — ACETAMINOPHEN 10 MG/ML IV SOLN
INTRAVENOUS | Status: AC
  Filled 2020-07-09: qty 100

## 2020-07-09 MED ORDER — LIDOCAINE HCL (CARDIAC) PF 100 MG/5ML IV SOSY
PREFILLED_SYRINGE | INTRAVENOUS | Status: DC | PRN
  Administered 2020-07-09: 100 mg via INTRAVENOUS

## 2020-07-09 MED ORDER — BUPIVACAINE-EPINEPHRINE (PF) 0.5% -1:200000 IJ SOLN
INTRAMUSCULAR | Status: AC
  Filled 2020-07-09: qty 30

## 2020-07-09 MED ORDER — ONDANSETRON HCL 4 MG/2ML IJ SOLN
INTRAMUSCULAR | Status: AC
  Filled 2020-07-09: qty 2

## 2020-07-09 SURGICAL SUPPLY — 53 items
ADH SKN CLS APL DERMABOND .7 (GAUZE/BANDAGES/DRESSINGS) ×1
APL PRP STRL LF DISP 70% ISPRP (MISCELLANEOUS) ×1
BAG DECANTER FOR FLEXI CONT (MISCELLANEOUS) ×3 IMPLANT
BLADE SURG SZ11 CARB STEEL (BLADE) ×3 IMPLANT
BOOT SUTURE AID YELLOW STND (SUTURE) ×3 IMPLANT
BRUSH SCRUB EZ  4% CHG (MISCELLANEOUS) ×3
BRUSH SCRUB EZ 4% CHG (MISCELLANEOUS) ×1 IMPLANT
CANISTER SUCT 1200ML W/VALVE (MISCELLANEOUS) ×3 IMPLANT
CHLORAPREP W/TINT 26 (MISCELLANEOUS) ×3 IMPLANT
CLIP SPRNG 6MM S-JAW DBL (CLIP) ×3
COVER WAND RF STERILE (DRAPES) ×3 IMPLANT
DERMABOND ADVANCED (GAUZE/BANDAGES/DRESSINGS) ×2
DERMABOND ADVANCED .7 DNX12 (GAUZE/BANDAGES/DRESSINGS) ×1 IMPLANT
ELECT CAUTERY BLADE 6.4 (BLADE) ×3 IMPLANT
ELECT REM PT RETURN 9FT ADLT (ELECTROSURGICAL) ×3
ELECTRODE REM PT RTRN 9FT ADLT (ELECTROSURGICAL) ×1 IMPLANT
GEL ULTRASOUND 20GR AQUASONIC (MISCELLANEOUS) IMPLANT
GLOVE BIO SURGEON STRL SZ7 (GLOVE) ×6 IMPLANT
GLOVE INDICATOR 7.5 STRL GRN (GLOVE) ×3 IMPLANT
GOWN STRL REUS W/ TWL LRG LVL3 (GOWN DISPOSABLE) ×2 IMPLANT
GOWN STRL REUS W/ TWL XL LVL3 (GOWN DISPOSABLE) ×1 IMPLANT
GOWN STRL REUS W/TWL LRG LVL3 (GOWN DISPOSABLE) ×6
GOWN STRL REUS W/TWL XL LVL3 (GOWN DISPOSABLE) ×3
HEMOSTAT SURGICEL 2X3 (HEMOSTASIS) ×3 IMPLANT
IV NS 500ML (IV SOLUTION) ×3
IV NS 500ML BAXH (IV SOLUTION) ×1 IMPLANT
KIT TURNOVER KIT A (KITS) ×3 IMPLANT
LABEL OR SOLS (LABEL) ×3 IMPLANT
LOOP RED MAXI  1X406MM (MISCELLANEOUS) ×2
LOOP VESSEL MAXI  1X406 RED (MISCELLANEOUS) ×1
LOOP VESSEL MAXI 1X406 RED (MISCELLANEOUS) ×1 IMPLANT
LOOP VESSEL MINI 0.8X406 BLUE (MISCELLANEOUS) ×1 IMPLANT
LOOPS BLUE MINI 0.8X406MM (MISCELLANEOUS) ×2
NEEDLE FILTER BLUNT 18X 1/2SAF (NEEDLE) ×2
NEEDLE FILTER BLUNT 18X1 1/2 (NEEDLE) ×1 IMPLANT
NS IRRIG 500ML POUR BTL (IV SOLUTION) ×3 IMPLANT
PACK EXTREMITY (MISCELLANEOUS) ×3 IMPLANT
PAD PREP 24X41 OB/GYN DISP (PERSONAL CARE ITEMS) ×3 IMPLANT
SOLUTION CELL SAVER (CLIP) ×1 IMPLANT
STOCKINETTE STRL 4IN 9604848 (GAUZE/BANDAGES/DRESSINGS) ×3 IMPLANT
SUT MNCRL AB 4-0 PS2 18 (SUTURE) ×3 IMPLANT
SUT PROLENE 6 0 BV (SUTURE) ×12 IMPLANT
SUT SILK 2 0 (SUTURE) ×3
SUT SILK 2-0 18XBRD TIE 12 (SUTURE) ×1 IMPLANT
SUT SILK 3 0 (SUTURE) ×3
SUT SILK 3-0 18XBRD TIE 12 (SUTURE) ×1 IMPLANT
SUT SILK 4 0 (SUTURE) ×3
SUT SILK 4-0 18XBRD TIE 12 (SUTURE) ×1 IMPLANT
SUT VIC AB 3-0 SH 27 (SUTURE) ×3
SUT VIC AB 3-0 SH 27X BRD (SUTURE) ×1 IMPLANT
SYR 20ML LL LF (SYRINGE) ×3 IMPLANT
SYR 3ML LL SCALE MARK (SYRINGE) ×3 IMPLANT
SYR TB 1ML 27GX1/2 LL (SYRINGE) IMPLANT

## 2020-07-09 NOTE — Anesthesia Preprocedure Evaluation (Signed)
Anesthesia Evaluation  Patient identified by MRN, date of birth, ID band Patient awake    Reviewed: Allergy & Precautions, NPO status , Patient's Chart, lab work & pertinent test results, reviewed documented beta blocker date and time   History of Anesthesia Complications Negative for: history of anesthetic complications  Airway Mallampati: III  TM Distance: >3 FB     Dental  (+) Dental Advidsory Given, Teeth Intact   Pulmonary neg shortness of breath, asthma , neg COPD, neg recent URI, former smoker,    Pulmonary exam normal        Cardiovascular hypertension, Pt. on medications and Pt. on home beta blockers (-) angina+CHF  (-) Past MI and (-) Cardiac Stents Normal cardiovascular exam+ dysrhythmias (-) Valvular Problems/Murmurs     Neuro/Psych negative neurological ROS  negative psych ROS   GI/Hepatic negative GI ROS, Neg liver ROS,   Endo/Other  diabetes, Type 2Morbid obesity  Renal/GU ESRF and DialysisRenal disease     Musculoskeletal   Abdominal   Peds  Hematology  (+) Blood dyscrasia, anemia ,   Anesthesia Other Findings Past Medical History: No date: Anemia of chronic disease No date: Asthma 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 No date: CHF (congestive heart failure) ([facility]) No date: CKD (chronic kidney disease), stage III No date: Diabetes ([place])     Comment:  type 2 No date: Hypertension No date: Morbid obesity ([place])   Reproductive/Obstetrics negative OB ROS                             Anesthesia Physical  Anesthesia Plan  ASA: III  Anesthesia Plan: General   Post-op Pain Management:  Regional for Post-op pain   Induction: Intravenous  PONV Risk Score and Plan: 3 and Ondansetron, Dexamethasone, Midazolam and Treatment may vary due to age or medical condition  Airway Management Planned: Oral ETT  Additional Equipment:   Intra-op Plan:   Post-operative Plan: Extubation in  OR  Informed Consent: I have reviewed the patients History and Physical, chart, labs and discussed the procedure including the risks, benefits and alternatives for the proposed anesthesia with the patient or authorized representative who has indicated his/her understanding and acceptance.       Plan Discussed with: CRNA  Anesthesia Plan Comments:         Anesthesia Quick Evaluation

## 2020-07-09 NOTE — Transfer of Care (Signed)
Immediate Anesthesia Transfer of Care Note  Patient: [name]  Procedure(s) Performed: ARTERIOVENOUS (AV) FISTULA CREATION ( BRACHIAL CEPHALIC) (Left )  Patient Location: PACU  Anesthesia Type:General  Level of Consciousness: awake, alert , oriented and patient cooperative  Airway & Oxygen Therapy: Patient Spontanous Breathing and Patient connected to face mask oxygen  Post-op Assessment: Report given to RN and Post -op Vital signs reviewed and stable  Post vital signs: Reviewed and stable  Last Vitals:  Vitals Value Taken Time  BP 144/84 07/09/20 1430  Temp    Pulse 78 07/09/20 1432  Resp 19 07/09/20 1432  SpO2 100 % 07/09/20 1432  Vitals shown include unvalidated device data.  Last Pain:  Vitals:   07/09/20 1131  TempSrc: Temporal  PainSc: 0-No pain         Complications: No complications documented.

## 2020-07-09 NOTE — Anesthesia Procedure Notes (Addendum)
Procedure Name: Intubation Date/Time: 07/09/2020 12:52 PM Performed by: Ruffin Pyo, RN Pre-anesthesia Checklist: Patient identified, Emergency Drugs available, Suction available and Patient being monitored Patient Re-evaluated:Patient Re-evaluated prior to induction Oxygen Delivery Method: Circle system utilized Preoxygenation: Pre-oxygenation with 100% oxygen Induction Type: IV induction Ventilation: Mask ventilation without difficulty and Oral airway inserted - appropriate to patient size Laryngoscope Size: Miller and 3 Grade View: Grade I Tube type: Oral Tube size: 7.0 mm Number of attempts: 1 Airway Equipment and Method: Stylet and Oral airway Placement Confirmation: ETT inserted through vocal cords under direct vision,  positive ETCO2 and breath sounds checked- equal and bilateral Secured at: 23 cm Tube secured with: Tape Dental Injury: Teeth and Oropharynx as per pre-operative assessment

## 2020-07-09 NOTE — Progress Notes (Signed)
Pt having 4/10 pain. Dr. Amie Critchley notified. Acknowledged. Orders received.

## 2020-07-09 NOTE — Discharge Instructions (Signed)

## 2020-07-09 NOTE — Interval H&P Note (Signed)
History and Physical Interval Note:  07/09/2020 12:05 PM  [name]  has presented today for surgery, with the diagnosis of ESRD.  The various methods of treatment have been discussed with the patient and family. After consideration of risks, benefits and other options for treatment, the patient has consented to  Procedure(s): ARTERIOVENOUS (AV) FISTULA CREATION ( BRACHIAL CEPHALIC) (Left) as a surgical intervention.  The patient's history has been reviewed, patient examined, no change in status, stable for surgery.  I have reviewed the patient's chart and labs.  Questions were answered to the patient's satisfaction.     [name]

## 2020-07-09 NOTE — Op Note (Addendum)
[facility]   OPERATIVE NOTE   PROCEDURE: Left brachiocephalic arteriovenous fistula placement  PRE-OPERATIVE DIAGNOSIS: 1.  ESRD        POST-OPERATIVE DIAGNOSIS: 1. ESRD       SURGEON: [name], MD  ASSISTANT(S): Hezzie Bump, PA-C  ANESTHESIA: general  ESTIMATED BLOOD LOSS: 25 cc  FINDING(S): Adequate cephalic vein for fistula creation  SPECIMEN(S):  none  INDICATIONS:   [name] is a 34 y.o. female who presents with renal failure in need of pemanent dialysis acces.  The patient is scheduled for left arm AVF placement.  The patient is aware the risks include but are not limited to: bleeding, infection, steal syndrome, nerve damage, ischemic monomelic neuropathy, failure to mature, and need for additional procedures.  The patient is aware of the risks of the procedure and elects to proceed forward. An assistant was present during the procedure to help facilitate the exposure and expedite the procedure.  DESCRIPTION: After full informed written consent was obtained from the patient, the patient was brought back to the operating room and placed supine upon the operating table.  Prior to induction, the patient received IV antibiotics. The assistant provided retraction and mobilization to help facilitate exposure and expedite the procedure throughout the entire procedure.  This included following suture, using retractors, and optimizing lighting.  After obtaining adequate anesthesia, the patient was then prepped and draped in the standard fashion for a left arm access procedure.  I made a curvilinear incision at the level of the antecubital fossa and dissected through the subcutaneous tissue and fascia to gain exposure of the brachial artery.  This was noted to be patent and adequate in size for fistula creation.  This was dissected out proximally and distally and prepared for control with vessel loops .  I then dissected out the cephalic vein.  This was  noted to be patent and adequate in size for fistula creation, although marginally so.  I then gave the patient 3000 units of intravenous heparin.  The vein was marked for orientation and the distal segment of the vein was ligated with a  2-0 silk, and the vein was transected.  I then instilled the heparinized saline into the vein and clamped it.  At this point, I reset my exposure of the brachial artery and pulled up control on the vessel loops.  I made an arteriotomy with a #11 blade, and then I extended the arteriotomy with a Potts scissor.  I injected heparinized saline proximal and distal to this arteriotomy.  The vein was then sewn to the artery in an end-to-side configuration with a running stitch of 6-0 Prolene.  Prior to completing this anastomosis, I allowed the vein and artery to backbleed.  There was no evidence of clot from any vessels.  I completed the anastomosis in the usual fashion and then released all vessel loops and clamps.  There was a palpable  thrill in the venous outflow, and there was a palpable pulse in the artery distal to the anastomosis.  At this point, I irrigated out the surgical wound.  Surgicel was placed. There was no further active bleeding.  The subcutaneous tissue was reapproximated with a running stitch of 3-0 Vicryl.  The skin was then closed with a 4-0 Monocryl suture.  The skin was then cleaned, dried, and reinforced with Dermabond.  The patient tolerated this procedure well and was taken to the recovery room in stable condition  COMPLICATIONS: None  CONDITION: Stable  [name]    07/09/2020, 2:20 PM  This note was created with Dragon Medical transcription system. Any errors in dictation are purely unintentional.

## 2020-07-10 ENCOUNTER — Encounter: Payer: Self-pay | Admitting: Vascular Surgery

## 2020-07-10 NOTE — Anesthesia Postprocedure Evaluation (Signed)
Anesthesia Post Note  Patient: [name]  Procedure(s) Performed: ARTERIOVENOUS (AV) FISTULA CREATION ( BRACHIAL CEPHALIC) (Left )  Patient location during evaluation: PACU Anesthesia Type: General Level of consciousness: awake and alert Pain management: pain level controlled Vital Signs Assessment: post-procedure vital signs reviewed and stable Respiratory status: spontaneous breathing, nonlabored ventilation, respiratory function stable and patient connected to nasal cannula oxygen Cardiovascular status: blood pressure returned to baseline and stable Postop Assessment: no apparent nausea or vomiting Anesthetic complications: no   No complications documented.   Last Vitals:  Vitals:   07/09/20 1630 07/09/20 1709  BP: 130/65 (!) 145/78  Pulse: 78 77  Resp: 16 16  Temp: 36.4 C 36.6 C  SpO2: 98% 95%    Last Pain:  Vitals:   07/10/20 0816  TempSrc:   PainSc: 4                  Martha Clan

## 2020-07-22 NOTE — Interval H&P Note (Signed)
History and Physical Interval Note:  07/22/2020 3:11 PM  [place]  has presented today for surgery, with the diagnosis of ESRD.  The various methods of treatment have been discussed with the patient and family. After consideration of risks, benefits and other options for treatment, the patient has consented to  Procedure(s): ARTERIOVENOUS (AV) FISTULA CREATION ( BRACHIAL CEPHALIC) (Left) as a surgical intervention.  The patient's history has been reviewed, patient examined, no change in status, stable for surgery.  I have reviewed the patient's chart and labs.  Questions were answered to the patient's satisfaction.     [name]

## 2020-08-03 NOTE — H&P (Signed)
[facility] Admission History & Physical  MRN : [id number]  [name] is a 33 y.o. (January 23, 1986) female who presents with chief complaint of No chief complaint on file. Marland Kitchen  History of Present Illness: Patient presents for placement of an AV fistula.  She has longstanding end-stage renal disease.  She does still make urine.  She is currently using a peritoneal dialysis catheter, but has intermittent fluid issues and needs a hemodialysis access for removal of fluid intermittently.  She is right-hand dominant.  Preoperative vein mapping showed adequate anatomy for left brachiocephalic AV fistula creation.  No current facility-administered medications for this encounter.   Current Outpatient Medications  Medication Sig Dispense Refill  . amLODipine (NORVASC) 10 MG tablet Take 10 mg by mouth daily.    . carvedilol (COREG) 25 MG tablet Take 1 tablet (25 mg total) by mouth 2 (two) times daily with a meal. 60 tablet 5  . cloNIDine (CATAPRES) 0.1 MG tablet Take 0.1 mg by mouth 3 (three) times daily.    . ferric citrate (AURYXIA) 1 GM 210 MG(Fe) tablet Take 420 mg by mouth 3 (three) times daily.    . ferrous gluconate (FERGON) 324 MG tablet Take 324 mg by mouth daily.    Marland Kitchen gentamicin cream (GARAMYCIN) 0.1 % APPLY TO EXIT SITE DAILY    . isosorbide mononitrate (IMDUR) 30 MG 24 hr tablet Take 1 tablet (30 mg total) by mouth 2 (two) times daily. 60 tablet 2  . losartan (COZAAR) 100 MG tablet Take 100 mg by mouth every evening.     . multivitamin (RENA-VIT) TABS tablet Take 1 tablet by mouth at bedtime. 30 tablet 0  . torsemide (DEMADEX) 20 MG tablet Take 40 mg by mouth daily.     . vitamin C (VITAMIN C) 500 MG tablet Take 1 tablet (500 mg total) by mouth 2 (two) times daily. 60 tablet 1  . aspirin EC 81 MG EC tablet Take 1 tablet (81 mg total) by mouth daily. 30 tablet 2  . HYDROcodone-acetaminophen (NORCO) 5-325 MG tablet Take 1 tablet by mouth every 6 (six) hours as  needed for moderate pain. 30 tablet 0  . HYDROcodone-acetaminophen (NORCO) 5-325 MG tablet Take 1 tablet by mouth every 6 (six) hours as needed for moderate pain. 30 tablet 0  . Potassium Chloride ER 20 MEQ TBCR Take 1 tablet by mouth daily.    . sodium thiosulfate 25 % injection Inject 12.5 g into the vein once.      Past Medical History:  Diagnosis Date  . Anemia of chronic disease   . Asthma   . Calciphylaxis 2020  . CHF (congestive heart failure) ([place])   . CKD (chronic kidney disease), stage III   . Diabetes ([place])    type 2  . Hypertension   . Morbid obesity ([place])     Past Surgical History:  Procedure Laterality Date  . APPLICATION OF WOUND VAC Bilateral 09/15/2019   Procedure: APPLICATION OF WOUND VAC;  Surgeon: Benjamine Sprague, DO;  Location: [facility];  Service: General;  Laterality: Bilateral;  . AV FISTULA PLACEMENT Left 07/09/2020   Procedure: ARTERIOVENOUS (AV) FISTULA CREATION ( BRACHIAL CEPHALIC);  Surgeon: Algernon Huxley, MD;  Location: [facility];  Service: Vascular;  Laterality: Left;  . DIALYSIS/PERMA CATHETER INSERTION N/A 02/28/2019   Procedure: DIALYSIS/PERMA CATHETER INSERTION;  Surgeon: Algernon Huxley, MD;  Location: [facility];  Service: Cardiovascular;  Laterality: N/A;  . DIALYSIS/PERMA CATHETER INSERTION N/A 09/17/2019  Procedure: DIALYSIS/PERMA CATHETER EXCHANGE;  Surgeon: Katha Cabal, MD;  Location: [facility];  Service: Cardiovascular;  Laterality: N/A;  . DIALYSIS/PERMA CATHETER REMOVAL N/A 03/18/2020   Procedure: DIALYSIS/PERMA CATHETER REMOVAL;  Surgeon: Algernon Huxley, MD;  Location: [facility];  Service: Cardiovascular;  Laterality: N/A;  . pd cath    . TONSILLECTOMY    . WOUND DEBRIDEMENT Bilateral 09/15/2019   Procedure: DEBRIDEMENT WOUND;  Surgeon: Benjamine Sprague, DO;  Location: [facility];  Service: General;  Laterality: Bilateral;     Social History   Tobacco Use  . Smoking status: Former Smoker    Types: Cigars  .  Smokeless tobacco: Never Used  . Tobacco comment: over 10 years  Vaping Use  . Vaping Use: Never used  Substance Use Topics  . Alcohol use: No    Alcohol/week: 0.0 standard drinks  . Drug use: No     Family History  Problem Relation Age of Onset  . Hypertension Mother   . Multiple sclerosis Mother   . CAD Mother   . Cancer Maternal Grandmother        Ovarian   . Cancer Maternal Grandfather        Prostate   . Kidney disease Father     Allergies  Allergen Reactions  . Tomato (Diagnostic) Itching     REVIEW OF SYSTEMS (Negative unless checked)  Constitutional: [] Weight loss  [] Fever  [] Chills Cardiac: [] Chest pain   [] Chest pressure   [] Palpitations   [] Shortness of breath when laying flat   [] Shortness of breath at rest   [] Shortness of breath with exertion. Vascular:  [] Pain in legs with walking   [] Pain in legs at rest   [] Pain in legs when laying flat   [] Claudication   [] Pain in feet when walking  [] Pain in feet at rest  [] Pain in feet when laying flat   [] History of DVT   [] Phlebitis   [] Swelling in legs   [] Varicose veins   [] Non-healing ulcers Pulmonary:   [] Uses home oxygen   [] Productive cough   [] Hemoptysis   [] Wheeze  [] COPD   [] Asthma Neurologic:  [] Dizziness  [] Blackouts   [] Seizures   [] History of stroke   [] History of TIA  [] Aphasia   [] Temporary blindness   [] Dysphagia   [] Weakness or numbness in arms   [] Weakness or numbness in legs Musculoskeletal:  [] Arthritis   [] Joint swelling   [] Joint pain   [] Low back pain Hematologic:  [] Easy bruising  [] Easy bleeding   [] Hypercoagulable state   [x] Anemic  [] Hepatitis Gastrointestinal:  [] Blood in stool   [] Vomiting blood  [x] Gastroesophageal reflux/heartburn   [] Difficulty swallowing. Genitourinary:  [x] Chronic kidney disease   [] Difficult urination  [] Frequent urination  [] Burning with urination   [] Blood in urine Skin:  [] Rashes   [] Ulcers   [] Wounds Psychological:  [] History of anxiety   []  History of major  depression.  Physical Examination  Vitals:   07/09/20 1545 07/09/20 1600 07/09/20 1630 07/09/20 1709  BP: 134/77 135/73 130/65 (!) 145/78  Pulse: 72 72 78 77  Resp: 18 20 16 16   Temp:  97.6 F (36.4 C) 97.6 F (36.4 C) 97.8 F (36.6 C)  TempSrc:   Temporal Temporal  SpO2: 94% 95% 98% 95%  Weight:      Height:       Body mass index is 48.39 kg/m. Gen: WD/WN, NAD.  Morbidly obese Head: [place]/AT, No temporalis wasting.  Ear/Nose/Throat: Hearing grossly intact, nares w/o erythema or drainage,  oropharynx w/o Erythema/Exudate,  Eyes: Conjunctiva clear, sclera non-icteric Neck: Trachea midline.  No JVD.  Pulmonary:  Good air movement, respirations not labored, no use of accessory muscles.  Cardiac: RRR, normal S1, S2. Vascular: Allen's test normal bilaterally Vessel Right Left  Radial Palpable Palpable           Gastrointestinal: soft, non-tender/non-distended. No guarding/reflex.  PD catheter exiting left abdomen Musculoskeletal: M/S 5/5 throughout.  Extremities without ischemic changes.  No deformity or atrophy.  Neurologic: Sensation grossly intact in extremities.  Symmetrical.  Speech is fluent. Motor exam as listed above. Psychiatric: Judgment intact, Mood & affect appropriate for pt's clinical situation. Dermatologic: No rashes or ulcers noted.  No cellulitis or open wounds.      CBC Lab Results  Component Value Date   WBC 16.2 (H) 06/18/2020   HGB 13.9 07/09/2020   HCT 41.0 07/09/2020   MCV 92.4 06/18/2020   PLT 303 06/18/2020    BMET    Component Value Date/Time   NA 138 07/09/2020 1108   NA 135 (L) 12/31/2013 1020   K 3.5 07/09/2020 1108   K 3.9 12/31/2013 1020   CL 98 07/09/2020 1108   CL 105 12/31/2013 1020   CO2 22 06/18/2020 1217   CO2 26 12/31/2013 1020   GLUCOSE 111 (H) 07/09/2020 1108   GLUCOSE 243 (H) 12/31/2013 1020   BUN 51 (H) 07/09/2020 1108   BUN 19 (H) 12/31/2013 1020   CREATININE 12.30 (H) 07/09/2020 1108   CREATININE 2.12 (H)  12/31/2013 1020   CALCIUM 7.9 (L) 06/18/2020 1217   CALCIUM 8.5 12/31/2013 1020   GFRNONAA 4 (L) 06/18/2020 1217   GFRNONAA 31 (L) 12/31/2013 1020   GFRAA 5 (L) 06/18/2020 1217   GFRAA 36 (L) 12/31/2013 1020   CrCl cannot be calculated (Patient's most recent lab result is older than the maximum 21 days allowed.).  COAG Lab Results  Component Value Date   INR 1.0 06/18/2020   INR 1.1 07/02/2019   INR 1.16 02/12/2016    Radiology No results found.   Assessment/Plan 1.  End-stage renal disease.  Has a peritoneal dialysis catheter, but still has volume issues and occasionally needs hemodialysis.  For this reason, we are asked to place a fistula.  Appears to have adequate anatomy for brachiocephalic AV fistula creation on the left.  This will be planned today.  Risks and benefits discussed. 2.  Hypertension.  An underlying cause of her renal failure and blood pressure control important in reducing the progression of atherosclerotic disease. On appropriate oral medications. 3.  Diabetes.  An underlying cause of her renal failure and blood glucose control important in reducing the progression of atherosclerotic disease. Also, involved in wound healing. On appropriate medications.     [name], MD  08/03/2020 9:39 AM

## 2020-08-12 ENCOUNTER — Other Ambulatory Visit (INDEPENDENT_AMBULATORY_CARE_PROVIDER_SITE_OTHER): Payer: Self-pay | Admitting: Vascular Surgery

## 2020-08-12 DIAGNOSIS — N186 End stage renal disease: Secondary | ICD-10-CM

## 2020-08-12 DIAGNOSIS — Z9889 Other specified postprocedural states: Secondary | ICD-10-CM

## 2020-08-13 ENCOUNTER — Ambulatory Visit (INDEPENDENT_AMBULATORY_CARE_PROVIDER_SITE_OTHER): Payer: Medicare Other | Admitting: Nurse Practitioner

## 2020-08-13 ENCOUNTER — Encounter (INDEPENDENT_AMBULATORY_CARE_PROVIDER_SITE_OTHER): Payer: Self-pay | Admitting: Nurse Practitioner

## 2020-08-13 ENCOUNTER — Other Ambulatory Visit: Payer: Self-pay

## 2020-08-13 ENCOUNTER — Ambulatory Visit (INDEPENDENT_AMBULATORY_CARE_PROVIDER_SITE_OTHER): Payer: Medicare Other

## 2020-08-13 VITALS — BP 149/79 | HR 77 | Resp 16 | Wt 289.6 lb

## 2020-08-13 DIAGNOSIS — N186 End stage renal disease: Secondary | ICD-10-CM

## 2020-08-13 DIAGNOSIS — Z9889 Other specified postprocedural states: Secondary | ICD-10-CM

## 2020-08-13 DIAGNOSIS — I10 Essential (primary) hypertension: Secondary | ICD-10-CM

## 2020-08-16 ENCOUNTER — Encounter (INDEPENDENT_AMBULATORY_CARE_PROVIDER_SITE_OTHER): Payer: Self-pay | Admitting: Nurse Practitioner

## 2020-08-16 NOTE — Progress Notes (Signed)
Subjective:    Patient ID: [name], female    DOB: 1986-07-13, 34 y.o.   MRN: [id number] Chief Complaint  Patient presents with  . Follow-up    [facility] 5wk post AV fistula creation    [name] is a 34 year old female that presents today after brachiocephalic AV fistula placement in her left upper extremity.  The patient is currently in end-stage renal disease patient that dialyzes primarily via a peritoneal dialysis.  The purpose of her AV fistula is for backup purposes at this time.  The patient denies any signs symptoms indicative of steal syndrome.  She denies any severe numbness or discomfort in her hand or facial area.  Overall she has done well post surgery.  She denies any fever, chills, nausea, vomiting or diarrhea.  Today noninvasive studies show flow volume of 870.  The AV fistula is patent with no areas of significant stenosis.   Review of Systems  Skin: Negative for wound.  All other systems reviewed and are negative.      Objective:   Physical Exam Vitals reviewed.  HENT:     Head: Normocephalic.  Cardiovascular:     Rate and Rhythm: Normal rate.     Pulses: Normal pulses.          Radial pulses are 2+ on the left side.     Arteriovenous access: left arteriovenous access is present.    Comments: Left brachiocephalic AV fistula with good thrill and bruit Pulmonary:     Effort: Pulmonary effort is normal.  Skin:    General: Skin is warm and dry.  Neurological:     Mental Status: She is alert and oriented to person, place, and time.  Psychiatric:        Mood and Affect: Mood normal.        Behavior: Behavior normal.        Thought Content: Thought content normal.        Judgment: Judgment normal.     BP (!) 149/79 (BP Location: Right Arm)   Pulse 77   Resp 16   Wt 289 lb 9.6 oz (131.4 kg)   BMI 46.74 kg/m   Past Medical History:  Diagnosis Date  . Anemia of chronic disease   . Asthma   . Calciphylaxis 2020  . CHF (congestive heart  failure) ([place])   . CKD (chronic kidney disease), stage III ([place])   . Diabetes ([place])    type 2  . Hypertension   . Morbid obesity ([place])     Social History   Socioeconomic History  . Marital status: Single    Spouse name: Not on file  . Number of children: Not on file  . Years of education: Not on file  . Highest education level: Not on file  Occupational History  . Occupation: works at [employer]  . Smoking status: Former Smoker    Types: Cigars  . Smokeless tobacco: Never Used  . Tobacco comment: over 10 years  Vaping Use  . Vaping Use: Never used  Substance and Sexual Activity  . Alcohol use: No    Alcohol/week: 0.0 standard drinks  . Drug use: No  . Sexual activity: Yes    Birth control/protection: None  Other Topics Concern  . Not on file  Social History Narrative  . Not on file   Social Determinants of Health   Financial Resource Strain:   . Difficulty of Paying Living Expenses: Not on file  Food Insecurity:   . Worried About Charity fundraiser in the Last Year: Not on file  . Ran Out of Food in the Last Year: Not on file  Transportation Needs:   . Lack of Transportation (Medical): Not on file  . Lack of Transportation (Non-Medical): Not on file  Physical Activity:   . Days of Exercise per Week: Not on file  . Minutes of Exercise per Session: Not on file  Stress:   . Feeling of Stress : Not on file  Social Connections:   . Frequency of Communication with Friends and Family: Not on file  . Frequency of Social Gatherings with Friends and Family: Not on file  . Attends Religious Services: Not on file  . Active Member of Clubs or Organizations: Not on file  . Attends Archivist Meetings: Not on file  . Marital Status: Not on file  Intimate Partner Violence:   . Fear of Current or Ex-Partner: Not on file  . Emotionally Abused: Not on file  . Physically Abused: Not on file  . Sexually Abused: Not on file    Past Surgical History:    Procedure Laterality Date  . APPLICATION OF WOUND VAC Bilateral 09/15/2019   Procedure: APPLICATION OF WOUND VAC;  Surgeon: Benjamine Sprague, DO;  Location: [facility];  Service: General;  Laterality: Bilateral;  . AV FISTULA PLACEMENT Left 07/09/2020   Procedure: ARTERIOVENOUS (AV) FISTULA CREATION ( BRACHIAL CEPHALIC);  Surgeon: Algernon Huxley, MD;  Location: [facility];  Service: Vascular;  Laterality: Left;  . DIALYSIS/PERMA CATHETER INSERTION N/A 02/28/2019   Procedure: DIALYSIS/PERMA CATHETER INSERTION;  Surgeon: Algernon Huxley, MD;  Location: [facility];  Service: Cardiovascular;  Laterality: N/A;  . DIALYSIS/PERMA CATHETER INSERTION N/A 09/17/2019   Procedure: DIALYSIS/PERMA CATHETER EXCHANGE;  Surgeon: Katha Cabal, MD;  Location: [facility];  Service: Cardiovascular;  Laterality: N/A;  . DIALYSIS/PERMA CATHETER REMOVAL N/A 03/18/2020   Procedure: DIALYSIS/PERMA CATHETER REMOVAL;  Surgeon: Algernon Huxley, MD;  Location: [facility];  Service: Cardiovascular;  Laterality: N/A;  . pd cath    . TONSILLECTOMY    . WOUND DEBRIDEMENT Bilateral 09/15/2019   Procedure: DEBRIDEMENT WOUND;  Surgeon: Benjamine Sprague, DO;  Location: [facility];  Service: General;  Laterality: Bilateral;    Family History  Problem Relation Age of Onset  . Hypertension Mother   . Multiple sclerosis Mother   . CAD Mother   . Cancer Maternal Grandmother        Ovarian   . Cancer Maternal Grandfather        Prostate   . Kidney disease Father     Allergies  Allergen Reactions  . Tomato (Diagnostic) Itching    CBC Latest Ref Rng & Units 07/09/2020 06/18/2020 01/30/2020  WBC 4.0 - 10.5 K/uL - 16.2(H) -  Hemoglobin 12.0 - 15.0 g/dL 13.9 11.8(L) 7.3(L)  Hematocrit 36 - 46 % 41.0 36.3 22.6(L)  Platelets 150 - 400 K/uL - 303 -      CMP     Component Value Date/Time   NA 138 07/09/2020 1108   NA 135 (L) 12/31/2013 1020   K 3.5 07/09/2020 1108   K 3.9 12/31/2013 1020   CL 98 07/09/2020 1108    CL 105 12/31/2013 1020   CO2 22 06/18/2020 1217   CO2 26 12/31/2013 1020   GLUCOSE 111 (H) 07/09/2020 1108   GLUCOSE 243 (H) 12/31/2013 1020   BUN 51 (H) 07/09/2020 1108  BUN 19 (H) 12/31/2013 1020   CREATININE 12.30 (H) 07/09/2020 1108   CREATININE 2.12 (H) 12/31/2013 1020   CALCIUM 7.9 (L) 06/18/2020 1217   CALCIUM 8.5 12/31/2013 1020   PROT 7.4 09/14/2019 1459   PROT 7.4 12/31/2013 1020   ALBUMIN 2.8 (L) 09/17/2019 0614   ALBUMIN 3.1 (L) 12/31/2013 1020   AST 14 (L) 09/14/2019 1459   AST 21 12/31/2013 1020   ALT 16 09/14/2019 1459   ALT 26 12/31/2013 1020   ALKPHOS 81 09/14/2019 1459   ALKPHOS 98 12/31/2013 1020   BILITOT 0.8 09/14/2019 1459   BILITOT 0.4 12/31/2013 1020   GFRNONAA 4 (L) 06/18/2020 1217   GFRNONAA 31 (L) 12/31/2013 1020   GFRAA 5 (L) 06/18/2020 1217   GFRAA 36 (L) 12/31/2013 1020        Assessment & Plan:   1. ESRD (end stage renal disease) ([place]) The patient has a good thrill and bruit however her fistula is not readily palpable indicating it is not fully mature at this time.  Fortunately, the patient permanently dialyzes using peritoneal dialysis and her AV fistula is more so a backup.  We will have the patient return in 2 months to reevaluate her AV fistula to determine if it is mature for use at that time.  Otherwise the patient is instructed to use a squeeze ball for increasing maturation.  She is also advised that she is unable to allow blood pressures, blood draws or IVs in that left upper extremity now.  2. Essential hypertension Continue antihypertensive medications as already ordered, these medications have been reviewed and there are no changes at this time.    Current Outpatient Medications on File Prior to Visit  Medication Sig Dispense Refill  . albuterol (VENTOLIN HFA) 108 (90 Base) MCG/ACT inhaler Inhale into the lungs every 6 (six) hours as needed.     Marland Kitchen amLODipine (NORVASC) 10 MG tablet Take 10 mg by mouth daily.    Marland Kitchen aspirin EC 81  MG EC tablet Take 1 tablet (81 mg total) by mouth daily. 30 tablet 2  . carvedilol (COREG) 25 MG tablet Take 1 tablet (25 mg total) by mouth 2 (two) times daily with a meal. 60 tablet 5  . Cholecalciferol (VITAMIN D) 50 MCG (2000 UT) CAPS Take by mouth daily.    . cloNIDine (CATAPRES) 0.1 MG tablet Take 0.1 mg by mouth 3 (three) times daily.    . cloNIDine (CATAPRES) 0.2 MG tablet Take 0.2 mg by mouth 3 (three) times daily.    . ferric citrate (AURYXIA) 1 GM 210 MG(Fe) tablet Take 420 mg by mouth 3 (three) times daily.    . ferrous gluconate (FERGON) 324 MG tablet Take 324 mg by mouth daily.    Marland Kitchen gentamicin cream (GARAMYCIN) 0.1 % APPLY TO EXIT SITE DAILY    . HYDROcodone-acetaminophen (NORCO) 5-325 MG tablet Take 1 tablet by mouth every 6 (six) hours as needed for moderate pain. 30 tablet 0  . HYDROcodone-acetaminophen (NORCO) 5-325 MG tablet Take 1 tablet by mouth every 6 (six) hours as needed for moderate pain. 30 tablet 0  . isosorbide mononitrate (IMDUR) 30 MG 24 hr tablet Take 1 tablet (30 mg total) by mouth 2 (two) times daily. 60 tablet 2  . losartan (COZAAR) 100 MG tablet Take 100 mg by mouth every evening.     . multivitamin (RENA-VIT) TABS tablet Take 1 tablet by mouth at bedtime. 30 tablet 0  . Potassium Chloride ER 20 MEQ TBCR Take  1 tablet by mouth daily.    . sodium thiosulfate 25 % injection Inject 12.5 g into the vein once.    . torsemide (DEMADEX) 20 MG tablet Take 40 mg by mouth daily.     . vitamin C (VITAMIN C) 500 MG tablet Take 1 tablet (500 mg total) by mouth 2 (two) times daily. 60 tablet 1   No current facility-administered medications on file prior to visit.    There are no Patient Instructions on file for this visit. No follow-ups on file.   Kris Hartmann, NP

## 2020-09-19 ENCOUNTER — Encounter: Payer: Self-pay | Admitting: Emergency Medicine

## 2020-09-19 ENCOUNTER — Other Ambulatory Visit: Payer: Self-pay

## 2020-09-19 DIAGNOSIS — Z87891 Personal history of nicotine dependence: Secondary | ICD-10-CM | POA: Insufficient documentation

## 2020-09-19 DIAGNOSIS — Z992 Dependence on renal dialysis: Secondary | ICD-10-CM | POA: Diagnosis not present

## 2020-09-19 DIAGNOSIS — L819 Disorder of pigmentation, unspecified: Secondary | ICD-10-CM | POA: Insufficient documentation

## 2020-09-19 DIAGNOSIS — Z7982 Long term (current) use of aspirin: Secondary | ICD-10-CM | POA: Diagnosis not present

## 2020-09-19 DIAGNOSIS — I5032 Chronic diastolic (congestive) heart failure: Secondary | ICD-10-CM | POA: Insufficient documentation

## 2020-09-19 DIAGNOSIS — E1122 Type 2 diabetes mellitus with diabetic chronic kidney disease: Secondary | ICD-10-CM | POA: Insufficient documentation

## 2020-09-19 DIAGNOSIS — Z79899 Other long term (current) drug therapy: Secondary | ICD-10-CM | POA: Insufficient documentation

## 2020-09-19 DIAGNOSIS — J45909 Unspecified asthma, uncomplicated: Secondary | ICD-10-CM | POA: Insufficient documentation

## 2020-09-19 DIAGNOSIS — I132 Hypertensive heart and chronic kidney disease with heart failure and with stage 5 chronic kidney disease, or end stage renal disease: Secondary | ICD-10-CM | POA: Insufficient documentation

## 2020-09-19 DIAGNOSIS — I999 Unspecified disorder of circulatory system: Secondary | ICD-10-CM | POA: Diagnosis present

## 2020-09-19 DIAGNOSIS — N186 End stage renal disease: Secondary | ICD-10-CM | POA: Diagnosis not present

## 2020-09-19 LAB — CBC WITH DIFFERENTIAL/PLATELET
Abs Immature Granulocytes: 0.11 10*3/uL — ABNORMAL HIGH (ref 0.00–0.07)
Basophils Absolute: 0.1 10*3/uL (ref 0.0–0.1)
Basophils Relative: 0 %
Eosinophils Absolute: 0.6 10*3/uL — ABNORMAL HIGH (ref 0.0–0.5)
Eosinophils Relative: 4 %
HCT: 34.3 % — ABNORMAL LOW (ref 36.0–46.0)
Hemoglobin: 11.4 g/dL — ABNORMAL LOW (ref 12.0–15.0)
Immature Granulocytes: 1 %
Lymphocytes Relative: 17 %
Lymphs Abs: 2.6 10*3/uL (ref 0.7–4.0)
MCH: 31.4 pg (ref 26.0–34.0)
MCHC: 33.2 g/dL (ref 30.0–36.0)
MCV: 94.5 fL (ref 80.0–100.0)
Monocytes Absolute: 1.2 10*3/uL — ABNORMAL HIGH (ref 0.1–1.0)
Monocytes Relative: 8 %
Neutro Abs: 10.6 10*3/uL — ABNORMAL HIGH (ref 1.7–7.7)
Neutrophils Relative %: 70 %
Platelets: 250 10*3/uL (ref 150–400)
RBC: 3.63 MIL/uL — ABNORMAL LOW (ref 3.87–5.11)
RDW: 15.7 % — ABNORMAL HIGH (ref 11.5–15.5)
WBC: 15.1 10*3/uL — ABNORMAL HIGH (ref 4.0–10.5)
nRBC: 0 % (ref 0.0–0.2)

## 2020-09-19 LAB — COMPREHENSIVE METABOLIC PANEL
ALT: 7 U/L (ref 0–44)
AST: 11 U/L — ABNORMAL LOW (ref 15–41)
Albumin: 3.2 g/dL — ABNORMAL LOW (ref 3.5–5.0)
Alkaline Phosphatase: 112 U/L (ref 38–126)
Anion gap: 21 — ABNORMAL HIGH (ref 5–15)
BUN: 58 mg/dL — ABNORMAL HIGH (ref 6–20)
CO2: 23 mmol/L (ref 22–32)
Calcium: 7.6 mg/dL — ABNORMAL LOW (ref 8.9–10.3)
Chloride: 95 mmol/L — ABNORMAL LOW (ref 98–111)
Creatinine, Ser: 12.28 mg/dL — ABNORMAL HIGH (ref 0.44–1.00)
GFR, Estimated: 4 mL/min — ABNORMAL LOW (ref >60)
Glucose, Bld: 121 mg/dL — ABNORMAL HIGH (ref 70–99)
Potassium: 3.2 mmol/L — ABNORMAL LOW (ref 3.5–5.1)
Sodium: 139 mmol/L (ref 135–145)
Total Bilirubin: 0.7 mg/dL (ref 0.3–1.2)
Total Protein: 8 g/dL (ref 6.5–8.1)

## 2020-09-19 NOTE — ED Triage Notes (Signed)
Pt to ED via POV with c/o ciculation issues. 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 Pt states feels like her feet are cold, and feels like she has darkening to inner L middle finger. Pt's bilateral feet warm to touch, pt states + sensation to bilateral feet. Pt with noted blister to L pinky toe.

## 2020-09-20 ENCOUNTER — Emergency Department
Admission: EM | Admit: 2020-09-20 | Discharge: 2020-09-20 | Disposition: A | Discharge status: Home / Self Care | Payer: Medicare Other | Attending: Emergency Medicine | Admitting: Emergency Medicine

## 2020-09-20 DIAGNOSIS — L819 Disorder of pigmentation, unspecified: Secondary | ICD-10-CM

## 2020-09-20 NOTE — ED Notes (Signed)
MD at bedside. 

## 2020-09-20 NOTE — Discharge Instructions (Signed)
Call your nephrologist and your vascular surgeon on Monday for close follow-up for evaluation of any blockages in the arteries to your legs.  Return to the emergency room if your legs feel cold to the touch, if they look pale or blue in color, if you have pain, or any other symptoms that are concerning to you.

## 2020-09-20 NOTE — ED Provider Notes (Signed)
[facility] Emergency Department Provider Note  ____________________________________________  Time seen: Approximately 2:19 AM  I have reviewed the triage vital signs and the nursing notes.   HISTORY  Chief Complaint Circulation problems   HPI [name] is a 34 y.o. female with a history of ESRD on HD, diabetes, hypertension, morbid obesity, anemia who presents for evaluation of circulation complaints.  Patient purple discoloration and intermittent cold feet and hands for the last 2 weeks. No history of PAD. The symptoms are unchanged with elevation of legs or with the legs in dependent position. There is nothing she does to make it better. No leg pain or swelling. No CP or SOB. No weakness or numbness of her legs  Past Medical History:  Diagnosis Date  . Anemia of chronic disease   . Asthma   . Calciphylaxis 2020  . CHF (congestive heart failure) ([place])   . CKD (chronic kidney disease), stage III ([place])   . Diabetes ([place])    type 2  . Hypertension   . Morbid obesity [facility])     Patient Active Problem List   Diagnosis Date Noted  . Obesity, Class III, BMI 40-49.9 (morbid obesity) ([place]) 09/15/2019  . Infection of anterior lower leg 09/14/2019  . Calciphylaxis   . ESRD (end stage renal disease) ([place])   . Chronic diastolic CHF (congestive heart failure) ([place])   . Asthma 07/18/2019  . Sepsis due to cellulitis ([place]) 07/02/2019  . ESRD (end stage renal disease) on dialysis ([place]) 05/02/2019  . Morbid obesity with BMI of 50.0-59.9, adult ([place]) 05/02/2019  . Essential hypertension 05/02/2019  . Chronic kidney disease, stage III (moderate) ([place]) 08/31/2017  . Impaired fasting glucose 08/31/2017  . Anemia 05/27/2016  . HTN (hypertension) 03/11/2016  . Renal insufficiency 03/11/2016  . Menorrhagia   . Nephrotic syndrome   . Anemia in chronic kidney disease   . Iron deficiency anemia 02/12/2016  . CHF (congestive heart failure) ([place]) 02/11/2016     Past Surgical History:  Procedure Laterality Date  . APPLICATION OF WOUND VAC Bilateral 09/15/2019   Procedure: APPLICATION OF WOUND VAC;  Surgeon: Benjamine Sprague, DO;  Location: [facility] ORS;  Service: General;  Laterality: Bilateral;  . AV FISTULA PLACEMENT Left 07/09/2020   Procedure: ARTERIOVENOUS (AV) FISTULA CREATION ( BRACHIAL CEPHALIC);  Surgeon: Algernon Huxley, MD;  Location: [facility] ORS;  Service: Vascular;  Laterality: Left;  . DIALYSIS/PERMA CATHETER INSERTION N/A 02/28/2019   Procedure: DIALYSIS/PERMA CATHETER INSERTION;  Surgeon: Algernon Huxley, MD;  Location: [place] [facility];  Service: Cardiovascular;  Laterality: N/A;  . DIALYSIS/PERMA CATHETER INSERTION N/A 09/17/2019   Procedure: DIALYSIS/PERMA CATHETER EXCHANGE;  Surgeon: Katha Cabal, MD;  Location: [place] [facility];  Service: Cardiovascular;  Laterality: N/A;  . DIALYSIS/PERMA CATHETER REMOVAL N/A 03/18/2020   Procedure: DIALYSIS/PERMA CATHETER REMOVAL;  Surgeon: Algernon Huxley, MD;  Location: [place] [facility];  Service: Cardiovascular;  Laterality: N/A;  . pd cath    . TONSILLECTOMY    . WOUND DEBRIDEMENT Bilateral 09/15/2019   Procedure: DEBRIDEMENT WOUND;  Surgeon: Benjamine Sprague, DO;  Location: [facility] ORS;  Service: General;  Laterality: Bilateral;    Prior to Admission medications   Medication Sig Start Date End Date Taking? Authorizing Provider  albuterol (VENTOLIN HFA) 108 (90 Base) MCG/ACT inhaler Inhale into the lungs every 6 (six) hours as needed.     [provider]  amLODipine (NORVASC) 10 MG tablet Take 10 mg by mouth daily.  [provider]  aspirin EC 81 MG EC tablet Take 1 tablet (81 mg total) by mouth daily. 02/16/16   Demetrios Loll, MD  carvedilol (COREG) 25 MG tablet Take 1 tablet (25 mg total) by mouth 2 (two) times daily with a meal. 02/20/18   Alisa Graff, FNP  Cholecalciferol (VITAMIN D) 50 MCG (2000 UT) CAPS Take by mouth daily.    [provider]  cloNIDine (CATAPRES)  0.1 MG tablet Take 0.1 mg by mouth 3 (three) times daily.    [provider]  cloNIDine (CATAPRES) 0.2 MG tablet Take 0.2 mg by mouth 3 (three) times daily. 07/30/20   [provider]  ferric citrate (AURYXIA) 1 GM 210 MG(Fe) tablet Take 420 mg by mouth 3 (three) times daily. 07/18/19   [provider]  ferrous gluconate (FERGON) 324 MG tablet Take 324 mg by mouth daily. 01/07/20   [provider]  gentamicin cream (GARAMYCIN) 0.1 % APPLY TO EXIT SITE DAILY 01/08/20   [provider]  HYDROcodone-acetaminophen (NORCO) 5-325 MG tablet Take 1 tablet by mouth every 6 (six) hours as needed for moderate pain. 07/09/20   Algernon Huxley, MD  HYDROcodone-acetaminophen (NORCO) 5-325 MG tablet Take 1 tablet by mouth every 6 (six) hours as needed for moderate pain. 07/09/20   Algernon Huxley, MD  isosorbide mononitrate (IMDUR) 30 MG 24 hr tablet Take 1 tablet (30 mg total) by mouth 2 (two) times daily. 09/18/19   Fritzi Mandes, MD  losartan (COZAAR) 100 MG tablet Take 100 mg by mouth every evening.     [provider]  multivitamin (RENA-VIT) TABS tablet Take 1 tablet by mouth at bedtime. 09/18/19   Fritzi Mandes, MD  Potassium Chloride ER 20 MEQ TBCR Take 1 tablet by mouth daily. 04/13/20   [provider]  sodium thiosulfate 25 % injection Inject 12.5 g into the vein once.    [provider]  torsemide (DEMADEX) 20 MG tablet Take 40 mg by mouth daily.     [provider]  vitamin C (VITAMIN C) 500 MG tablet Take 1 tablet (500 mg total) by mouth 2 (two) times daily. 09/18/19   Fritzi Mandes, MD    Allergies Tomato (diagnostic)  Family History  Problem Relation Age of Onset  . Hypertension Mother   . Multiple sclerosis Mother   . CAD Mother   . Cancer Maternal Grandmother        Ovarian   . Cancer Maternal Grandfather        Prostate   . Kidney disease Father     Social History Social History   Tobacco Use  . Smoking status: Former  Smoker    Types: Cigars  . Smokeless tobacco: Never Used  . Tobacco comment: over 10 years  Vaping Use  . Vaping Use: Never used  Substance Use Topics  . Alcohol use: No    Alcohol/week: 0.0 standard drinks  . Drug use: No    Review of Systems  Constitutional: Negative for fever. Eyes: Negative for visual changes. ENT: Negative for sore throat. Neck: No neck pain  Cardiovascular: Negative for chest pain. Respiratory: Negative for shortness of breath. Gastrointestinal: Negative for abdominal pain, vomiting or diarrhea. Genitourinary: Negative for dysuria. Musculoskeletal: Negative for back pain. + b/l feet and hand cold Skin: Negative for rash. Neurological: Negative for headaches, weakness or numbness. Psych: No SI or HI  ____________________________________________   PHYSICAL EXAM:  VITAL SIGNS: ED Triage Vitals [09/19/20 1459]  Enc  Vitals Group     BP (!) 152/91     Pulse Rate 76     Resp 18     Temp 98.7 F (37.1 C)     Temp Source Oral     SpO2 100 %     Weight 292 lb (132.5 kg)     Height 5\' 6"  (1.676 m)     Head Circumference      Peak Flow      Pain Score 6     Pain Loc      Pain Edu?      Excl. in [place]?     Constitutional: Alert and oriented. Well appearing and in no apparent distress. HEENT:      Head: Normocephalic and atraumatic.         Eyes: Conjunctivae are normal. Sclera is non-icteric.       Mouth/Throat: Mucous membranes are moist.       Neck: Supple with no signs of meningismus. Cardiovascular: Regular rate and rhythm. No murmurs, gallops, or rubs. Respiratory: Normal respiratory effort. Lungs are clear to auscultation bilaterally.  Gastrointestinal: Soft, non tender. Musculoskeletal: Purple/brownish discoloration of the skin of toes and fingers, non blanching, extremities are warm and well perfused, with normal capillary refill. Strong doppler DP and PT pulses, strong palpable radial pulses. NO signs of cyanosis. Neurologic: Normal speech  and language. Face is symmetric. Moving all extremities. No gross focal neurologic deficits are appreciated. Skin: Skin is warm, dry and intact. No rash noted. Psychiatric: Mood and affect are normal. Speech and behavior are normal.  ____________________________________________   LABS (all labs ordered are listed, but only abnormal results are displayed)  Labs Reviewed  CBC WITH DIFFERENTIAL/PLATELET - Abnormal; Notable for the following components:      Result Value   WBC 15.1 (*)    RBC 3.63 (*)    Hemoglobin 11.4 (*)    HCT 34.3 (*)    RDW 15.7 (*)    Neutro Abs 10.6 (*)    Monocytes Absolute 1.2 (*)    Eosinophils Absolute 0.6 (*)    Abs Immature Granulocytes 0.11 (*)    All other components within normal limits  COMPREHENSIVE METABOLIC PANEL - Abnormal; Notable for the following components:   Potassium 3.2 (*)    Chloride 95 (*)    Glucose, Bld 121 (*)    BUN 58 (*)    Creatinine, Ser 12.28 (*)    Calcium 7.6 (*)    Albumin 3.2 (*)    AST 11 (*)    GFR, Estimated 4 (*)    Anion gap 21 (*)    All other components within normal limits   ____________________________________________  EKG  ED ECG REPORT I, Rudene Re, the attending physician, personally viewed and interpreted this ECG.  Normal sinus rhythm, rate of 75, normal intervals, left axis deviation.  T wave inversions in 1 and aVL with no ST elevations.  Unchanged from prior from September 2021. ____________________________________________  RADIOLOGY  none  ____________________________________________   PROCEDURES  Procedure(s) performed: None Procedures Critical Care performed:  None ____________________________________________   INITIAL IMPRESSION / ASSESSMENT AND PLAN / ED COURSE  34 y.o. female with a history of ESRD on HD, diabetes, hypertension, morbid obesity, anemia who presents for evaluation of circulation complaints.  Patient does have a brownish skin discoloration of the fingers  and toes but no signs of cyanosis or ischemia with warm and well-perfused extremities, brisk capillary refill, strong palpable radial pulses bilaterally.  Unable  to palpate DP and PT pulses mostly because of body habitus therefore Doppler was used.  Patient with strong dopplerable pulses.  Labs showing mild leukocytosis with no signs or symptoms of infectious etiology at this time.  Chemistry panel within patient's baseline.  EKG with no signs of dysrhythmias or acute ischemic changes.  Old medical records reviewed.  Presentation most likely concern for hyperpigmentation associated with end-stage renal disease.  Recommended follow-up with her nephrologist.  Since patient has a history of obesity, ESRD, diabetes, and hypertension and is reporting intermittent episodes of cold feet, I will also recommend close follow-up with her vascular doctor for further investigation of possible peripheral artery disease.  Discussed my standard return precautions for any signs of ischemia.      _____________________________________________ Please note:  Patient was evaluated in Emergency Department today for the symptoms described in the history of present illness. Patient was evaluated in the context of the global COVID-19 pandemic, which necessitated consideration that the patient might be at risk for infection with the SARS-CoV-2 virus that causes COVID-19. Institutional protocols and algorithms that pertain to the evaluation of patients at risk for COVID-19 are in a state of rapid change based on information released by regulatory bodies including the CDC and federal and state organizations. These policies and algorithms were followed during the patient's care in the ED.  Some ED evaluations and interventions may be delayed as a result of limited staffing during the pandemic.   [place] Controlled Substance Database was reviewed by me. ____________________________________________   FINAL CLINICAL IMPRESSION(S) / ED  DIAGNOSES   Final diagnoses:  Hyperpigmentation of skin      NEW MEDICATIONS STARTED DURING THIS VISIT:  ED Discharge Orders    None       Note:  This document was prepared using Dragon voice recognition software and may include unintentional dictation errors.    Alfred Levins, [place], MD 09/20/20 0230

## 2020-09-20 NOTE — ED Notes (Signed)
Pt reports having all belongings  No peripheral IV placed this visit.   Discharge instructions reviewed with patient. Questions fielded by this RN. Patient verbalizes understanding of instructions. Patient discharged home in stable condition per [facility]. No acute distress noted at time of discharge.   Pt wheeled to lobby for family pick up

## 2020-09-28 ENCOUNTER — Other Ambulatory Visit (INDEPENDENT_AMBULATORY_CARE_PROVIDER_SITE_OTHER): Payer: Self-pay | Admitting: Nurse Practitioner

## 2020-09-28 DIAGNOSIS — R0989 Other specified symptoms and signs involving the circulatory and respiratory systems: Secondary | ICD-10-CM

## 2020-09-28 DIAGNOSIS — N186 End stage renal disease: Secondary | ICD-10-CM

## 2020-09-29 ENCOUNTER — Ambulatory Visit (INDEPENDENT_AMBULATORY_CARE_PROVIDER_SITE_OTHER): Payer: Medicare Other

## 2020-09-29 ENCOUNTER — Ambulatory Visit (INDEPENDENT_AMBULATORY_CARE_PROVIDER_SITE_OTHER): Payer: Medicare Other | Admitting: Vascular Surgery

## 2020-09-29 ENCOUNTER — Encounter (INDEPENDENT_AMBULATORY_CARE_PROVIDER_SITE_OTHER): Payer: Self-pay | Admitting: Vascular Surgery

## 2020-09-29 ENCOUNTER — Other Ambulatory Visit: Payer: Self-pay

## 2020-09-29 VITALS — BP 147/82 | HR 84 | Ht 66.0 in | Wt 293.0 lb

## 2020-09-29 DIAGNOSIS — R230 Cyanosis: Secondary | ICD-10-CM

## 2020-09-29 DIAGNOSIS — N186 End stage renal disease: Secondary | ICD-10-CM | POA: Diagnosis not present

## 2020-09-29 DIAGNOSIS — R0989 Other specified symptoms and signs involving the circulatory and respiratory systems: Secondary | ICD-10-CM

## 2020-09-29 DIAGNOSIS — I10 Essential (primary) hypertension: Secondary | ICD-10-CM

## 2020-09-29 NOTE — Progress Notes (Signed)
Patient ID: [name], female   DOB: 29-Jan-1986, 34 y.o.   MRN: [id number]  Chief Complaint  Patient presents with   Follow-up    2 mo U/S    HPI [name] is a 34 y.o. female.  Patient returns in follow-up of her dialysis access.  A little over 2 months ago we placed a left brachiocephalic AV fistula for end-stage renal disease.  She has not yet used this.  She continues to use her peritoneal dialysis catheter, but the transition over to hemodialysis is expected once the fistula is mature.  She may continue to have both options available.  Duplex today shows a hemodynamically significant stenosis in the distal upper arm cephalic vein not far from the anastomosis. The patient also says she is having dark discoloration of her toes as well as intermittently on her fingers.  Her circulation was checked with a Doppler probe and was found to be decent, but her nephrologist recommended she discuss this further with [place] and have more formal testing.  Past Medical History:  Diagnosis Date   Anemia of chronic disease    Asthma    Calciphylaxis 2020   CHF (congestive heart failure) ([place])    CKD (chronic kidney disease), stage III ([place])    Diabetes ([place])    type 2   Hypertension    Morbid obesity ([place])     Past Surgical History:  Procedure Laterality Date   APPLICATION OF WOUND VAC Bilateral 09/15/2019   Procedure: APPLICATION OF WOUND VAC;  Surgeon: Benjamine Sprague, DO;  Location: [facility] ORS;  Service: General;  Laterality: Bilateral;   AV FISTULA PLACEMENT Left 07/09/2020   Procedure: ARTERIOVENOUS (AV) FISTULA CREATION ( BRACHIAL CEPHALIC);  Surgeon: Algernon Huxley, MD;  Location: [facility] ORS;  Service: Vascular;  Laterality: Left;   DIALYSIS/PERMA CATHETER INSERTION N/A 02/28/2019   Procedure: DIALYSIS/PERMA CATHETER INSERTION;  Surgeon: Algernon Huxley, MD;  Location: [place] [facility];  Service: Cardiovascular;  Laterality: N/A;   DIALYSIS/PERMA CATHETER INSERTION N/A  09/17/2019   Procedure: DIALYSIS/PERMA CATHETER EXCHANGE;  Surgeon: Katha Cabal, MD;  Location: [place] [facility];  Service: Cardiovascular;  Laterality: N/A;   DIALYSIS/PERMA CATHETER REMOVAL N/A 03/18/2020   Procedure: DIALYSIS/PERMA CATHETER REMOVAL;  Surgeon: Algernon Huxley, MD;  Location: [place] [facility];  Service: Cardiovascular;  Laterality: N/A;   pd cath     TONSILLECTOMY     WOUND DEBRIDEMENT Bilateral 09/15/2019   Procedure: DEBRIDEMENT WOUND;  Surgeon: Benjamine Sprague, DO;  Location: [facility] ORS;  Service: General;  Laterality: Bilateral;      Allergies  Allergen Reactions   Tomato (Diagnostic) Itching    Current Outpatient Medications  Medication Sig Dispense Refill   albuterol (VENTOLIN HFA) 108 (90 Base) MCG/ACT inhaler Inhale into the lungs every 6 (six) hours as needed.      amLODipine (NORVASC) 10 MG tablet Take 10 mg by mouth daily.     aspirin EC 81 MG EC tablet Take 1 tablet (81 mg total) by mouth daily. 30 tablet 2   carvedilol (COREG) 25 MG tablet Take 1 tablet (25 mg total) by mouth 2 (two) times daily with a meal. 60 tablet 5   Cholecalciferol (VITAMIN D) 50 MCG (2000 UT) CAPS Take by mouth daily.     cloNIDine (CATAPRES) 0.1 MG tablet Take 0.1 mg by mouth 3 (three) times daily.     cloNIDine (CATAPRES) 0.2 MG tablet Take 0.2 mg by mouth 3 (three) times  daily.     ferric citrate (AURYXIA) 1 GM 210 MG(Fe) tablet Take 420 mg by mouth 3 (three) times daily.     ferrous gluconate (FERGON) 324 MG tablet Take 324 mg by mouth daily.     gentamicin cream (GARAMYCIN) 0.1 % APPLY TO EXIT SITE DAILY     HYDROcodone-acetaminophen (NORCO) 5-325 MG tablet Take 1 tablet by mouth every 6 (six) hours as needed for moderate pain. 30 tablet 0   HYDROcodone-acetaminophen (NORCO) 5-325 MG tablet Take 1 tablet by mouth every 6 (six) hours as needed for moderate pain. 30 tablet 0   isosorbide mononitrate (IMDUR) 30 MG 24 hr tablet Take 1 tablet (30 mg total) by  mouth 2 (two) times daily. 60 tablet 2   losartan (COZAAR) 100 MG tablet Take 100 mg by mouth every evening.      multivitamin (RENA-VIT) TABS tablet Take 1 tablet by mouth at bedtime. 30 tablet 0   Potassium Chloride ER 20 MEQ TBCR Take 1 tablet by mouth daily.     sodium thiosulfate 25 % injection Inject 12.5 g into the vein once.     torsemide (DEMADEX) 20 MG tablet Take 40 mg by mouth daily.      vitamin C (VITAMIN C) 500 MG tablet Take 1 tablet (500 mg total) by mouth 2 (two) times daily. 60 tablet 1   No current facility-administered medications for this visit.    REVIEW OF SYSTEMS (Negative unless checked)  Constitutional: [] ?Weight loss  [] ?Fever  [] ?Chills Cardiac: [] ?Chest pain   [] ?Chest pressure   [] ?Palpitations   [] ?Shortness of breath when laying flat   [] ?Shortness of breath at rest   [x] ?Shortness of breath with exertion. Vascular:  [x] ?Pain in legs with walking   [x] ?Pain in legs at rest   [x] ?Pain in legs when laying flat   [] ?Claudication   [] ?Pain in feet when walking  [] ?Pain in feet at rest  [] ?Pain in feet when laying flat   [] ?History of DVT   [] ?Phlebitis   [] ?Swelling in legs   [] ?Varicose veins   [] ?Non-healing ulcers Pulmonary:   [] ?Uses home oxygen   [] ?Productive cough   [] ?Hemoptysis   [] ?Wheeze  [] ?COPD   [] ?Asthma Neurologic:  [] ?Dizziness  [] ?Blackouts   [] ?Seizures   [] ?History of stroke   [] ?History of TIA  [] ?Aphasia   [] ?Temporary blindness   [] ?Dysphagia   [] ?Weakness or numbness in arms   [] ?Weakness or numbness in legs Musculoskeletal:  [] ?Arthritis   [] ?Joint swelling   [] ?Joint pain   [] ?Low back pain Hematologic:  [] ?Easy bruising  [] ?Easy bleeding   [] ?Hypercoagulable state   [x] ?Anemic  [] ?Hepatitis Gastrointestinal:  [] ?Blood in stool   [] ?Vomiting blood  [] ?Gastroesophageal reflux/heartburn   [] ?Difficulty swallowing. Genitourinary:  [x] ?Chronic kidney disease   [] ?Difficult urination  [] ?Frequent urination  [] ?Burning with urination    [] ?Blood in urine Skin:  [] ?Rashes   [] ?Ulcers   [] ?Wounds Psychological:  [] ?History of anxiety   [] ? History of major depression.    Physical Exam BP (!) 147/82    Pulse 84    Ht 5\' 6"  (1.676 m)    Wt 293 lb (132.9 kg)    BMI 47.29 kg/m  Gen:  WD/WN, NAD Skin: incision C/D/I Heart: Regular rate and rhythm.  No JVD Lungs: Respirations are nonlabored, no use of accessory muscles Ext: Weak thrill in left upper arm AV fistula beyond the perianastomotic region.  Dark discoloration is present on several toes on the left foot and her left second finger.  No skin breakdown or ulceration     Assessment/Plan:  ESRD (end stage renal disease) ([place]) Duplex today shows a hemodynamically significant stenosis in the distal upper arm cephalic vein not far from the anastomosis.  Given this finding and the poor thrill in the fistula and the lack of maturation, I would recommend fistulogram with possible intervention to improve the fistula maturation and get usable for dialysis.  Risks and benefits the procedure were discussed with the patient she is agreeable to proceed.  Essential hypertension blood pressure control important in reducing the progression of atherosclerotic disease. On appropriate oral medications.   Extremity cyanosis It is certainly reasonable to assess a patient with multiple atherosclerotic risk factors for peripheral arterial disease as a cause of some of her extremity symptoms.  Noninvasive studies to be done in the near future at her convenience.      [name] 09/29/2020, 3:53 PM   This note was created with Dragon medical transcription system.  Any errors from dictation are unintentional.

## 2020-09-29 NOTE — Assessment & Plan Note (Signed)
It is certainly reasonable to assess a patient with multiple atherosclerotic risk factors for peripheral arterial disease as a cause of some of her extremity symptoms.  Noninvasive studies to be done in the near future at her convenience.

## 2020-09-29 NOTE — H&P (View-Only) (Signed)
Patient ID: [name], female   DOB: August 03, 1986, 34 y.o.   MRN: [id number]  Chief Complaint  Patient presents with  . Follow-up    2 mo U/S    HPI [name] is a 34 y.o. female.  Patient returns in follow-up of her dialysis access.  A little over 2 months ago we placed a left brachiocephalic AV fistula for end-stage renal disease.  She has not yet used this.  She continues to use her peritoneal dialysis catheter, but the transition over to hemodialysis is expected once the fistula is mature.  She may continue to have both options available.  Duplex today shows a hemodynamically significant stenosis in the distal upper arm cephalic vein not far from the anastomosis. The patient also says she is having dark discoloration of her toes as well as intermittently on her fingers.  Her circulation was checked with a Doppler probe and was found to be decent, but her nephrologist recommended she discuss this further with [place] and have more formal testing.  Past Medical History:  Diagnosis Date  . Anemia of chronic disease   . Asthma   . Calciphylaxis 2020  . CHF (congestive heart failure) ([place])   . CKD (chronic kidney disease), stage III ([place])   . Diabetes ([place])    type 2  . Hypertension   . Morbid obesity ([place])     Past Surgical History:  Procedure Laterality Date  . APPLICATION OF WOUND VAC Bilateral 09/15/2019   Procedure: APPLICATION OF WOUND VAC;  Surgeon: Benjamine Sprague, DO;  Location: [facility];  Service: General;  Laterality: Bilateral;  . AV FISTULA PLACEMENT Left 07/09/2020   Procedure: ARTERIOVENOUS (AV) FISTULA CREATION ( BRACHIAL CEPHALIC);  Surgeon: Algernon Huxley, MD;  Location: [facility];  Service: Vascular;  Laterality: Left;  . DIALYSIS/PERMA CATHETER INSERTION N/A 02/28/2019   Procedure: DIALYSIS/PERMA CATHETER INSERTION;  Surgeon: Algernon Huxley, MD;  Location: [facility];  Service: Cardiovascular;  Laterality: N/A;  . DIALYSIS/PERMA CATHETER INSERTION N/A  09/17/2019   Procedure: DIALYSIS/PERMA CATHETER EXCHANGE;  Surgeon: Katha Cabal, MD;  Location: [facility];  Service: Cardiovascular;  Laterality: N/A;  . DIALYSIS/PERMA CATHETER REMOVAL N/A 03/18/2020   Procedure: DIALYSIS/PERMA CATHETER REMOVAL;  Surgeon: Algernon Huxley, MD;  Location: [facility];  Service: Cardiovascular;  Laterality: N/A;  . pd cath    . TONSILLECTOMY    . WOUND DEBRIDEMENT Bilateral 09/15/2019   Procedure: DEBRIDEMENT WOUND;  Surgeon: Benjamine Sprague, DO;  Location: [facility];  Service: General;  Laterality: Bilateral;      Allergies  Allergen Reactions  . Tomato (Diagnostic) Itching    Current Outpatient Medications  Medication Sig Dispense Refill  . albuterol (VENTOLIN HFA) 108 (90 Base) MCG/ACT inhaler Inhale into the lungs every 6 (six) hours as needed.     Marland Kitchen amLODipine (NORVASC) 10 MG tablet Take 10 mg by mouth daily.    Marland Kitchen aspirin EC 81 MG EC tablet Take 1 tablet (81 mg total) by mouth daily. 30 tablet 2  . carvedilol (COREG) 25 MG tablet Take 1 tablet (25 mg total) by mouth 2 (two) times daily with a meal. 60 tablet 5  . Cholecalciferol (VITAMIN D) 50 MCG (2000 UT) CAPS Take by mouth daily.    . cloNIDine (CATAPRES) 0.1 MG tablet Take 0.1 mg by mouth 3 (three) times daily.    . cloNIDine (CATAPRES) 0.2 MG tablet Take 0.2 mg by mouth 3 (three) times  daily.    . ferric citrate (AURYXIA) 1 GM 210 MG(Fe) tablet Take 420 mg by mouth 3 (three) times daily.    . ferrous gluconate (FERGON) 324 MG tablet Take 324 mg by mouth daily.    Marland Kitchen gentamicin cream (GARAMYCIN) 0.1 % APPLY TO EXIT SITE DAILY    . HYDROcodone-acetaminophen (NORCO) 5-325 MG tablet Take 1 tablet by mouth every 6 (six) hours as needed for moderate pain. 30 tablet 0  . HYDROcodone-acetaminophen (NORCO) 5-325 MG tablet Take 1 tablet by mouth every 6 (six) hours as needed for moderate pain. 30 tablet 0  . isosorbide mononitrate (IMDUR) 30 MG 24 hr tablet Take 1 tablet (30 mg total) by  mouth 2 (two) times daily. 60 tablet 2  . losartan (COZAAR) 100 MG tablet Take 100 mg by mouth every evening.     . multivitamin (RENA-VIT) TABS tablet Take 1 tablet by mouth at bedtime. 30 tablet 0  . Potassium Chloride ER 20 MEQ TBCR Take 1 tablet by mouth daily.    . sodium thiosulfate 25 % injection Inject 12.5 g into the vein once.    . torsemide (DEMADEX) 20 MG tablet Take 40 mg by mouth daily.     . vitamin C (VITAMIN C) 500 MG tablet Take 1 tablet (500 mg total) by mouth 2 (two) times daily. 60 tablet 1   No current facility-administered medications for this visit.    REVIEW OF SYSTEMS (Negative unless checked)  Constitutional: [] ?Weight loss  [] ?Fever  [] ?Chills Cardiac: [] ?Chest pain   [] ?Chest pressure   [] ?Palpitations   [] ?Shortness of breath when laying flat   [] ?Shortness of breath at rest   [x] ?Shortness of breath with exertion. Vascular:  [x] ?Pain in legs with walking   [x] ?Pain in legs at rest   [x] ?Pain in legs when laying flat   [] ?Claudication   [] ?Pain in feet when walking  [] ?Pain in feet at rest  [] ?Pain in feet when laying flat   [] ?History of DVT   [] ?Phlebitis   [] ?Swelling in legs   [] ?Varicose veins   [] ?Non-healing ulcers Pulmonary:   [] ?Uses home oxygen   [] ?Productive cough   [] ?Hemoptysis   [] ?Wheeze  [] ?COPD   [] ?Asthma Neurologic:  [] ?Dizziness  [] ?Blackouts   [] ?Seizures   [] ?History of stroke   [] ?History of TIA  [] ?Aphasia   [] ?Temporary blindness   [] ?Dysphagia   [] ?Weakness or numbness in arms   [] ?Weakness or numbness in legs Musculoskeletal:  [] ?Arthritis   [] ?Joint swelling   [] ?Joint pain   [] ?Low back pain Hematologic:  [] ?Easy bruising  [] ?Easy bleeding   [] ?Hypercoagulable state   [x] ?Anemic  [] ?Hepatitis Gastrointestinal:  [] ?Blood in stool   [] ?Vomiting blood  [] ?Gastroesophageal reflux/heartburn   [] ?Difficulty swallowing. Genitourinary:  [x] ?Chronic kidney disease   [] ?Difficult urination  [] ?Frequent urination  [] ?Burning with urination    [] ?Blood in urine Skin:  [] ?Rashes   [] ?Ulcers   [] ?Wounds Psychological:  [] ?History of anxiety   [] ? History of major depression.    Physical Exam BP (!) 147/82   Pulse 84   Ht 5\' 6"  (1.676 m)   Wt 293 lb (132.9 kg)   BMI 47.29 kg/m  Gen:  WD/WN, NAD Skin: incision C/D/I Heart: Regular rate and rhythm.  No JVD Lungs: Respirations are nonlabored, no use of accessory muscles Ext: Weak thrill in left upper arm AV fistula beyond the perianastomotic region.  Dark discoloration is present on several toes on the left foot and her left second finger.  No skin breakdown  or ulceration     Assessment/Plan:  ESRD (end stage renal disease) ([place]) Duplex today shows a hemodynamically significant stenosis in the distal upper arm cephalic vein not far from the anastomosis.  Given this finding and the poor thrill in the fistula and the lack of maturation, I would recommend fistulogram with possible intervention to improve the fistula maturation and get usable for dialysis.  Risks and benefits the procedure were discussed with the patient she is agreeable to proceed.  Essential hypertension blood pressure control important in reducing the progression of atherosclerotic disease. On appropriate oral medications.   Extremity cyanosis It is certainly reasonable to assess a patient with multiple atherosclerotic risk factors for peripheral arterial disease as a cause of some of her extremity symptoms.  Noninvasive studies to be done in the near future at her convenience.      [name] 09/29/2020, 3:53 PM   This note was created with Dragon medical transcription system.  Any errors from dictation are unintentional.

## 2020-09-29 NOTE — Assessment & Plan Note (Signed)
Duplex today shows a hemodynamically significant stenosis in the distal upper arm cephalic vein not far from the anastomosis.  Given this finding and the poor thrill in the fistula and the lack of maturation, I would recommend fistulogram with possible intervention to improve the fistula maturation and get usable for dialysis.  Risks and benefits the procedure were discussed with the patient she is agreeable to proceed.

## 2020-09-29 NOTE — Assessment & Plan Note (Signed)
blood pressure control important in reducing the progression of atherosclerotic disease. On appropriate oral medications.  

## 2020-10-02 ENCOUNTER — Telehealth (INDEPENDENT_AMBULATORY_CARE_PROVIDER_SITE_OTHER): Payer: Self-pay

## 2020-10-02 NOTE — Telephone Encounter (Signed)
I attempted to contact the patient and a message was left for a return call. 

## 2020-10-05 NOTE — Telephone Encounter (Signed)
Patient returned my call and is scheduled with Dr. [name] for a left arm fistula on 10/29/20 at the [facility]. Pre-op is a phone call on 10/20/20 between 8-1 pm and covid testing on 10/27/20 between 8-1 pm at the [place]. Pre-surgical instructions were discussed and will be mailed.

## 2020-10-13 ENCOUNTER — Encounter (INDEPENDENT_AMBULATORY_CARE_PROVIDER_SITE_OTHER): Payer: Medicare Other

## 2020-10-13 ENCOUNTER — Ambulatory Visit (INDEPENDENT_AMBULATORY_CARE_PROVIDER_SITE_OTHER): Payer: Medicare Other | Admitting: Vascular Surgery

## 2020-10-19 ENCOUNTER — Other Ambulatory Visit (INDEPENDENT_AMBULATORY_CARE_PROVIDER_SITE_OTHER): Payer: Self-pay | Admitting: Nurse Practitioner

## 2020-10-20 ENCOUNTER — Encounter
Admission: RE | Admit: 2020-10-20 | Discharge: 2020-10-20 | Disposition: A | Discharge status: Home / Self Care | Payer: Medicare Other | Source: Ambulatory Visit | Attending: Vascular Surgery | Admitting: Vascular Surgery

## 2020-10-20 ENCOUNTER — Telehealth (INDEPENDENT_AMBULATORY_CARE_PROVIDER_SITE_OTHER): Payer: Self-pay

## 2020-10-20 ENCOUNTER — Other Ambulatory Visit: Payer: Self-pay

## 2020-10-20 NOTE — Patient Instructions (Addendum)
Your procedure is scheduled on: Thursday, January 13 Report to the Registration Desk on the 1st floor of the [facility]. To find out your arrival time, please call [phone] between 1PM - 3PM on: Wednesday, January 12  REMEMBER: Instructions that are not followed completely may result in serious medical risk, up to and including death; or upon the discretion of your surgeon and anesthesiologist your surgery may need to be rescheduled.  Do not eat food after midnight the night before surgery.  No gum chewing, lozengers or hard candies.  You may however, drink water up to 2 hours before you are scheduled to arrive for your surgery. Do not drink anything within 2 hours of your scheduled arrival time.  TAKE THESE MEDICATIONS THE MORNING OF SURGERY WITH A SIP OF WATER:   1.  Albuterol inhaler 2.  Amlodipine 3.  Carvedilol 4.  Clonidine 5.  Isosorbide mononitrate  Use inhalers on the day of surgery and bring to the hospital.  One week prior to surgery: starting Thursday, January 6 Stop Anti-inflammatories (NSAIDS) such as Advil, Aleve, Ibuprofen, Motrin, Naproxen, Naprosyn and Aspirin based products such as Excedrin, Goodys Powder, BC Powder. Stop ANY OVER THE COUNTER supplements until after surgery. (vitamin C) (However, you may continue taking Vitamin D, Vitamin B, and multivitamin up until the day before surgery.)  No Alcohol for 24 hours before or after surgery.  No Smoking including e-cigarettes for 24 hours prior to surgery.  No chewable tobacco products for at least 6 hours prior to surgery.  No nicotine patches on the day of surgery.  Do not use any "recreational" drugs for at least a week prior to your surgery.  Please be advised that the combination of cocaine and anesthesia may have negative outcomes, up to and including death. If you test positive for cocaine, your surgery will be cancelled.  On the morning of surgery brush your teeth with toothpaste and water, you  may rinse your mouth with mouthwash if you wish. Do not swallow any toothpaste or mouthwash.  Do not wear jewelry, make-up, hairpins, clips or nail polish.  Do not wear lotions, powders, or perfumes.   Do not shave body from the neck down 48 hours prior to surgery just in case you cut yourself which could leave a site for infection.  Also, freshly shaved skin may become irritated if using the CHG soap.  Do not bring valuables to the hospital. [facility] is not responsible for any missing/lost belongings or valuables.   Use CHG Soap as directed on instruction sheet.  Notify your doctor if there is any change in your medical condition (cold, fever, infection).  Wear comfortable clothing (specific to your surgery type) to the hospital.  Plan for stool softeners for home use; pain medications have a tendency to cause constipation. You can also help prevent constipation by eating foods high in fiber such as fruits and vegetables and drinking plenty of fluids as your diet allows.  After surgery, you can help prevent lung complications by doing breathing exercises.  Take deep breaths and cough every 1-2 hours. Your doctor may order a device called an Incentive Spirometer to help you take deep breaths.  If you are being discharged the day of surgery, you will not be allowed to drive home. You will need a responsible adult (18 years or older) to drive you home and stay with you that night.   If you are taking public transportation, you will need to have a responsible  adult (18 years or older) with you. Please confirm with your physician that it is acceptable to use public transportation.   Please call the [facility] at [phone] if you have any questions about these instructions.  Visitation Policy:  Patients undergoing a surgery or procedure may have one family member or support person with them as long as that person is not COVID-19 positive or experiencing its  symptoms.  That person may remain in the waiting area during the procedure.

## 2020-10-20 NOTE — Telephone Encounter (Signed)
Spoke with the patient and she is scheduled with Dr. [name] for a left arm fistulagram on 10/29/20 with a 8:30 am arrival time to the [facility]. Covid testing on 10/27/20 between 8-1 pm at the [facility]. Pre-procedure instructions were discussed and will be mailed.

## 2020-10-21 ENCOUNTER — Ambulatory Visit (INDEPENDENT_AMBULATORY_CARE_PROVIDER_SITE_OTHER): Payer: Medicare Other

## 2020-10-21 ENCOUNTER — Ambulatory Visit (INDEPENDENT_AMBULATORY_CARE_PROVIDER_SITE_OTHER): Payer: Medicare Other | Admitting: Nurse Practitioner

## 2020-10-21 DIAGNOSIS — R230 Cyanosis: Secondary | ICD-10-CM

## 2020-10-26 ENCOUNTER — Other Ambulatory Visit (INDEPENDENT_AMBULATORY_CARE_PROVIDER_SITE_OTHER): Payer: Self-pay | Admitting: Nurse Practitioner

## 2020-10-27 ENCOUNTER — Other Ambulatory Visit: Payer: Self-pay

## 2020-10-27 ENCOUNTER — Other Ambulatory Visit
Admission: RE | Admit: 2020-10-27 | Discharge: 2020-10-27 | Disposition: A | Discharge status: Home / Self Care | Payer: Medicare Other | Source: Ambulatory Visit | Attending: Vascular Surgery | Admitting: Vascular Surgery

## 2020-10-27 ENCOUNTER — Telehealth (INDEPENDENT_AMBULATORY_CARE_PROVIDER_SITE_OTHER): Payer: Self-pay | Admitting: Nurse Practitioner

## 2020-10-27 DIAGNOSIS — Z20822 Contact with and (suspected) exposure to covid-19: Secondary | ICD-10-CM | POA: Insufficient documentation

## 2020-10-27 DIAGNOSIS — Z01812 Encounter for preprocedural laboratory examination: Secondary | ICD-10-CM | POA: Insufficient documentation

## 2020-10-27 NOTE — Telephone Encounter (Signed)
Call patient to discuss results of her ultrasound on 10/21/2020.  The patient's toe discoloration is constant and not variable that is typically seen with Raynaud's disease.  The toes are slightly painful but no rest pain like symptoms.  She denies any wounds or ulcerations.  Based upon the patient's noninvasive studies angiogram would be in the patient's best interest to evaluate for any significant atherosclerotic disease.  The patient has an upcoming fistulogram so we will delay any angiogram at this time.  When the patient presents to the office for her follow-up for her fistulogram we will discuss an angiogram in more detail and that we will give the patient opportunity to ask any questions about the procedure before moving forward.  Patient is advised that if she develops any wounds or ulcerations or worsening pain to contact our office.

## 2020-10-28 LAB — SARS CORONAVIRUS 2 (TAT 6-24 HRS): SARS Coronavirus 2: NEGATIVE

## 2020-10-29 ENCOUNTER — Encounter
Admission: RE | Disposition: A | Discharge status: Home / Self Care | Payer: Self-pay | Source: Home / Self Care | Attending: Vascular Surgery

## 2020-10-29 ENCOUNTER — Encounter: Payer: Self-pay | Admitting: Vascular Surgery

## 2020-10-29 ENCOUNTER — Ambulatory Visit: Admit: 2020-10-29 | Payer: Medicare Other | Admitting: Vascular Surgery

## 2020-10-29 ENCOUNTER — Ambulatory Visit
Admission: RE | Admit: 2020-10-29 | Discharge: 2020-10-29 | Disposition: A | Discharge status: Home / Self Care | Payer: Medicare Other | Attending: Vascular Surgery | Admitting: Vascular Surgery

## 2020-10-29 DIAGNOSIS — N185 Chronic kidney disease, stage 5: Secondary | ICD-10-CM

## 2020-10-29 DIAGNOSIS — R230 Cyanosis: Secondary | ICD-10-CM | POA: Insufficient documentation

## 2020-10-29 DIAGNOSIS — Z79899 Other long term (current) drug therapy: Secondary | ICD-10-CM | POA: Diagnosis not present

## 2020-10-29 DIAGNOSIS — I120 Hypertensive chronic kidney disease with stage 5 chronic kidney disease or end stage renal disease: Secondary | ICD-10-CM | POA: Insufficient documentation

## 2020-10-29 DIAGNOSIS — Z7982 Long term (current) use of aspirin: Secondary | ICD-10-CM | POA: Diagnosis not present

## 2020-10-29 DIAGNOSIS — T82858A Stenosis of vascular prosthetic devices, implants and grafts, initial encounter: Secondary | ICD-10-CM | POA: Insufficient documentation

## 2020-10-29 DIAGNOSIS — Z992 Dependence on renal dialysis: Secondary | ICD-10-CM | POA: Diagnosis not present

## 2020-10-29 DIAGNOSIS — Y841 Kidney dialysis as the cause of abnormal reaction of the patient, or of later complication, without mention of misadventure at the time of the procedure: Secondary | ICD-10-CM | POA: Insufficient documentation

## 2020-10-29 DIAGNOSIS — N186 End stage renal disease: Secondary | ICD-10-CM | POA: Diagnosis not present

## 2020-10-29 HISTORY — PX: A/V FISTULAGRAM: CATH118298

## 2020-10-29 LAB — POTASSIUM (ARMC VASCULAR LAB ONLY): Potassium (ARMC vascular lab): 3.5 (ref 3.5–5.1)

## 2020-10-29 SURGERY — A/V FISTULAGRAM
Anesthesia: Moderate Sedation | Site: Arm Upper | Laterality: Left

## 2020-10-29 SURGERY — ARTERIOVENOUS (AV) FISTULA CREATION
Anesthesia: General | Laterality: Left

## 2020-10-29 MED ORDER — MIDAZOLAM HCL 2 MG/ML PO SYRP: 8.0000 mg | ORAL_SOLUTION | Freq: Once | ORAL | Status: DC | PRN

## 2020-10-29 MED ORDER — FAMOTIDINE 20 MG PO TABS: 40.0000 mg | ORAL_TABLET | Freq: Once | ORAL | Status: DC | PRN

## 2020-10-29 MED ORDER — IODIXANOL 320 MG/ML IV SOLN
INTRAVENOUS | Status: DC | PRN
  Administered 2020-10-29: 25 mL via INTRAVENOUS

## 2020-10-29 MED ORDER — SODIUM CHLORIDE 0.9 % IV SOLN: INTRAVENOUS | Status: DC

## 2020-10-29 MED ORDER — CEFAZOLIN SODIUM-DEXTROSE 1-4 GM/50ML-% IV SOLN
INTRAVENOUS | Status: AC
  Filled 2020-10-29: qty 50

## 2020-10-29 MED ORDER — HEPARIN SODIUM (PORCINE) 1000 UNIT/ML IJ SOLN
INTRAMUSCULAR | Status: AC
  Filled 2020-10-29: qty 1

## 2020-10-29 MED ORDER — METHYLPREDNISOLONE SODIUM SUCC 125 MG IJ SOLR: 125.0000 mg | Freq: Once | INTRAMUSCULAR | Status: DC | PRN

## 2020-10-29 MED ORDER — FENTANYL CITRATE (PF) 100 MCG/2ML IJ SOLN
INTRAMUSCULAR | Status: AC
  Filled 2020-10-29: qty 2

## 2020-10-29 MED ORDER — MIDAZOLAM HCL 2 MG/2ML IJ SOLN
INTRAMUSCULAR | Status: DC | PRN
  Administered 2020-10-29: 1 mg via INTRAVENOUS
  Administered 2020-10-29: 2 mg via INTRAVENOUS

## 2020-10-29 MED ORDER — DIPHENHYDRAMINE HCL 50 MG/ML IJ SOLN: 50.0000 mg | Freq: Once | INTRAMUSCULAR | Status: DC | PRN

## 2020-10-29 MED ORDER — MIDAZOLAM HCL 5 MG/5ML IJ SOLN
INTRAMUSCULAR | Status: AC
  Filled 2020-10-29: qty 5

## 2020-10-29 MED ORDER — FENTANYL CITRATE (PF) 100 MCG/2ML IJ SOLN
INTRAMUSCULAR | Status: DC | PRN
  Administered 2020-10-29: 50 ug via INTRAVENOUS

## 2020-10-29 MED ORDER — CEFAZOLIN SODIUM-DEXTROSE 1-4 GM/50ML-% IV SOLN
1.0000 g | Freq: Once | INTRAVENOUS | Status: AC
  Administered 2020-10-29: 1 g via INTRAVENOUS

## 2020-10-29 SURGICAL SUPPLY — 15 items
BALLN DORADO 6X60X80 (BALLOONS) ×2
BALLN LUTONIX DCB 6X60X130 (BALLOONS) ×2
BALLOON DORADO 6X60X80 (BALLOONS) ×1 IMPLANT
BALLOON LUTONIX DCB 6X60X130 (BALLOONS) ×1 IMPLANT
CANNULA 5F STIFF (CANNULA) ×2 IMPLANT
CATH BEACON 5 .035 40 KMP TP (CATHETERS) ×1 IMPLANT
CATH BEACON 5 .038 40 KMP TP (CATHETERS) ×2
COVER PROBE U/S 5X48 (MISCELLANEOUS) ×2 IMPLANT
DRAPE BRACHIAL (DRAPES) ×2 IMPLANT
GUIDEWIRE VERSACORE 175CM (WIRE) ×2 IMPLANT
KIT ENCORE 26 ADVANTAGE (KITS) ×2 IMPLANT
PACK ANGIOGRAPHY (CUSTOM PROCEDURE TRAY) ×2 IMPLANT
SHEATH BRITE TIP 6FRX5.5 (SHEATH) ×2 IMPLANT
SUT MNCRL AB 4-0 PS2 18 (SUTURE) ×2 IMPLANT
TOWEL OR 17X26 4PK STRL BLUE (TOWEL DISPOSABLE) ×2 IMPLANT

## 2020-10-29 NOTE — Interval H&P Note (Signed)
History and Physical Interval Note:  10/29/2020 8:52 AM  [name]  has presented today for surgery, with the diagnosis of LT Arm Fistulagram poss intervention   ESRD   Pt to have Covid test on 1-11.  The various methods of treatment have been discussed with the patient and family. After consideration of risks, benefits and other options for treatment, the patient has consented to  Procedure(s): A/V FISTULAGRAM (Left) as a surgical intervention.  The patient's history has been reviewed, patient examined, no change in status, stable for surgery.  I have reviewed the patient's chart and labs.  Questions were answered to the patient's satisfaction.     [name]

## 2020-10-29 NOTE — Op Note (Signed)
[facility]    OPERATIVE NOTE   PROCEDURE: 1.   Left brachiocephalic arteriovenous fistula cannulation under ultrasound guidance 2.   Left arm fistulagram including central venogram 3.   PTA of distal arm cephalic vein portion of AVF with 6 mm diameter Lutonix drug-coated and 6 mm diameter high-pressure angioplasty balloon  PRE-OPERATIVE DIAGNOSIS: 1. ESRD 2. Poorly functional left brachiocephalic AVF  POST-OPERATIVE DIAGNOSIS: same as above   SURGEON: [name], MD  ANESTHESIA: local with MCS  ESTIMATED BLOOD LOSS: 5 cc  FINDING(S): 1. Greater than 90% stenosis in the cephalic vein about 5 cm beyond the anastomosis in the distal upper arm.  The remainder of the vein did not have any hemodynamically significant stenosis with mild disease at the cephalic vein subclavian vein confluence in the 40% range.  Central venous circulation appear to be patent.  SPECIMEN(S):  None  CONTRAST: 25 cc  FLUORO TIME: 2.8 minutes  MODERATE CONSCIOUS SEDATION TIME: Approximately 24 minutes with 3 mg of Versed and 50 mcg of Fentanyl   INDICATIONS: [name] is a 35 y.o. female who presents with malfunctioning left brachiocephalic arteriovenous fistula.  The patient is scheduled for left arm fistulagram.  The patient is aware the risks include but are not limited to: bleeding, infection, thrombosis of the cannulated access, and possible anaphylactic reaction to the contrast.  The patient is aware of the risks of the procedure and elects to proceed forward.  DESCRIPTION: After full informed written consent was obtained, the patient was brought back to the angiography suite and placed supine upon the angiography table.  The patient was connected to monitoring equipment. Moderate conscious sedation was administered with a face to face encounter with the patient throughout the procedure with my supervision of the RN administering medicines and monitoring the patient's  vital signs and mental status throughout from the start of the procedure until the patient was taken to the recovery room. The left arm was prepped and draped in the standard fashion for a percutaneous access intervention.  Under ultrasound guidance, the left brachiocephalic arteriovenous fistula was cannulated with a micropuncture needle under direct ultrasound guidance in a retrograde fashion in the proximal upper arm where it was patent and a permanent image was performed.  The microwire was advanced into the fistula and the needle was exchanged for the a microsheath.  I then upsized to a 6 Fr Sheath and imaging was performed.  Hand injections were completed to image the access including the central venous system. This demonstrated greater than 90% stenosis in the cephalic vein about 5 cm beyond the anastomosis in the distal upper arm.  The remainder of the vein did not have any hemodynamically significant stenosis with mild disease at the cephalic vein subclavian vein confluence in the 40% range.  Central venous circulation appear to be patent.  Based on the images, this patient will need intervention to the cephalic vein stenosis.   I then crossed the stenosis with a Versacore wire and a kumpe catheter.  Based on the imaging, a 6 mm x 6 cm  Lutonix drug coated angioplasty balloon was selected.  The balloon was centered around the distal upper arm cephalic vein stenosis and inflated to 12 ATM for 1 minute(s).  On completion imaging, a >50% residual stenosis was present.  I then exchanged for a 6 mm diameter high-pressure angioplasty balloon and inflated this to 20 atm for 1 minute.  Following this, there is only about a 15 to  20% residual stenosis present.   Based on the completion imaging, no further intervention is necessary.  The wire and balloon were removed from the sheath.  A 4-0 Monocryl purse-string suture was sewn around the sheath.  The sheath was removed while tying down the suture.  A sterile  bandage was applied to the puncture site.  COMPLICATIONS: None  CONDITION: Stable   [name]  10/29/2020 10:38 AM   This note was created with Dragon Medical transcription system. Any errors in dictation are purely unintentional.

## 2020-10-29 NOTE — Progress Notes (Signed)
Dr. [name] at bedside, speaking with pt. And her mother re: procedure. Both verbalized understanding of conversation.

## 2020-11-25 ENCOUNTER — Other Ambulatory Visit (INDEPENDENT_AMBULATORY_CARE_PROVIDER_SITE_OTHER): Payer: Self-pay | Admitting: Vascular Surgery

## 2020-11-25 DIAGNOSIS — N186 End stage renal disease: Secondary | ICD-10-CM

## 2020-11-26 ENCOUNTER — Ambulatory Visit (INDEPENDENT_AMBULATORY_CARE_PROVIDER_SITE_OTHER): Payer: Medicare Other | Admitting: Nurse Practitioner

## 2020-11-26 ENCOUNTER — Telehealth (INDEPENDENT_AMBULATORY_CARE_PROVIDER_SITE_OTHER): Payer: Self-pay | Admitting: Nurse Practitioner

## 2020-11-26 ENCOUNTER — Encounter (INDEPENDENT_AMBULATORY_CARE_PROVIDER_SITE_OTHER): Payer: Medicare Other

## 2020-11-26 NOTE — Telephone Encounter (Signed)
Patient has being reschedule for 11/30/20 and can discuss with provider about her concerns with her legs/feet during this appointment

## 2020-11-30 ENCOUNTER — Other Ambulatory Visit: Payer: Self-pay

## 2020-11-30 ENCOUNTER — Telehealth (INDEPENDENT_AMBULATORY_CARE_PROVIDER_SITE_OTHER): Payer: Self-pay

## 2020-11-30 ENCOUNTER — Encounter (INDEPENDENT_AMBULATORY_CARE_PROVIDER_SITE_OTHER): Payer: Self-pay | Admitting: Nurse Practitioner

## 2020-11-30 ENCOUNTER — Ambulatory Visit (INDEPENDENT_AMBULATORY_CARE_PROVIDER_SITE_OTHER): Payer: Medicare Other | Admitting: Nurse Practitioner

## 2020-11-30 ENCOUNTER — Ambulatory Visit (INDEPENDENT_AMBULATORY_CARE_PROVIDER_SITE_OTHER): Payer: Medicare Other

## 2020-11-30 VITALS — BP 147/90 | HR 91 | Resp 16 | Wt 306.4 lb

## 2020-11-30 DIAGNOSIS — N186 End stage renal disease: Secondary | ICD-10-CM | POA: Diagnosis not present

## 2020-11-30 DIAGNOSIS — Z992 Dependence on renal dialysis: Secondary | ICD-10-CM

## 2020-11-30 DIAGNOSIS — I10 Essential (primary) hypertension: Secondary | ICD-10-CM | POA: Diagnosis not present

## 2020-11-30 DIAGNOSIS — I70245 Atherosclerosis of native arteries of left leg with ulceration of other part of foot: Secondary | ICD-10-CM | POA: Diagnosis not present

## 2020-11-30 NOTE — Telephone Encounter (Signed)
Patient was seen in the office and is scheduled for a LLE angio on 12/02/20 with a 12:00 pm arrival time to the [facility]. Covid testing on 12/01/20 between 8-1 pm. Pre-procedure instructions were discussed and handed to the patient.

## 2020-12-01 ENCOUNTER — Encounter (INDEPENDENT_AMBULATORY_CARE_PROVIDER_SITE_OTHER): Payer: Self-pay | Admitting: Nurse Practitioner

## 2020-12-01 ENCOUNTER — Other Ambulatory Visit
Admission: RE | Admit: 2020-12-01 | Discharge: 2020-12-01 | Disposition: A | Discharge status: Home / Self Care | Payer: Medicare Other | Source: Ambulatory Visit | Attending: Vascular Surgery | Admitting: Vascular Surgery

## 2020-12-01 DIAGNOSIS — Z01812 Encounter for preprocedural laboratory examination: Secondary | ICD-10-CM | POA: Insufficient documentation

## 2020-12-01 DIAGNOSIS — Z20822 Contact with and (suspected) exposure to covid-19: Secondary | ICD-10-CM | POA: Insufficient documentation

## 2020-12-01 LAB — SARS CORONAVIRUS 2 (TAT 6-24 HRS): SARS Coronavirus 2: NEGATIVE

## 2020-12-01 NOTE — Progress Notes (Signed)
Subjective:    Patient ID: [name], female    DOB: 26-Oct-1985, 35 y.o.   MRN: [id number] Chief Complaint  Patient presents with  . Follow-up    [facility] 4 wk post av fistulagram    Patient presents today for evaluation of her left brachiocephalic AV fistula following intervention.  The patient is currently maintained via peritoneal dialysis however a fistula was placed for backup access.  Currently the patient has a flow volume of 2699 with no significant stenosis seen.  However the patient also notes issues with her upper and lower extremities.  She denies signs symptoms of steal syndrome however she does have discoloration of her bilateral fingertips as well as her toes.  This discoloration was consistent with Raynaud's disease.  Previous ABIs done on 10/21/2020 showed a right ABI of 1.27 and a left of 1.24.  The patient had triphasic tibial artery waveforms bilaterally however her TBI was 0.41 on the right and 0.44 on the left with diminished toe waveforms.  The patient subsequently developed blisters on her left fifth toe.  The blister ruptured and the patient currently has a ulcer on her left fifth toe.  It is dry with no significant drainage however it appears necrotic.   Review of Systems  Cardiovascular: Positive for leg swelling.  Skin: Positive for wound.  Hematological: Bruises/bleeds easily.  All other systems reviewed and are negative.      Objective:   Physical Exam Vitals reviewed.  HENT:     Head: Normocephalic.  Cardiovascular:     Rate and Rhythm: Normal rate.     Pulses: Decreased pulses.  Pulmonary:     Effort: Pulmonary effort is normal.  Musculoskeletal:     Right lower leg: Edema present.     Left lower leg: Edema present.       Feet:  Feet:     Left foot:     Skin integrity: Ulcer present.  Neurological:     Mental Status: She is alert and oriented to person, place, and time.  Psychiatric:        Mood and Affect: Mood normal.        Behavior:  Behavior normal.        Thought Content: Thought content normal.        Judgment: Judgment normal.     BP (!) 147/90 (BP Location: Right Arm)   Pulse 91   Resp 16   Wt (!) 306 lb 6.4 oz (139 kg)   BMI 49.45 kg/m   Past Medical History:  Diagnosis Date  . Anemia of chronic disease   . Asthma   . Calciphylaxis 2020  . CHF (congestive heart failure) ([place])   . CKD (chronic kidney disease), stage III ([place])   . Diabetes ([place])    type 2  . Hypertension   . Morbid obesity ([place])     Social History   Socioeconomic History  . Marital status: Single    Spouse name: Not on file  . Number of children: 0  . Years of education: Not on file  . Highest education level: Not on file  Occupational History  . Occupation: works at [employer]  . Smoking status: Former Smoker    Types: Cigars  . Smokeless tobacco: Never Used  Vaping Use  . Vaping Use: Never used  Substance and Sexual Activity  . Alcohol use: No    Alcohol/week: 0.0 standard drinks  . Drug use: No  .  Sexual activity: Yes    Birth control/protection: None  Other Topics Concern  . Not on file  Social History Narrative   Lives with mother    Social Determinants of Health   Financial Resource Strain: Not on file  Food Insecurity: Not on file  Transportation Needs: Not on file  Physical Activity: Not on file  Stress: Not on file  Social Connections: Not on file  Intimate Partner Violence: Not on file    Past Surgical History:  Procedure Laterality Date  . A/V FISTULAGRAM Left 10/29/2020   Procedure: A/V FISTULAGRAM;  Surgeon: Algernon Huxley, MD;  Location: [place] [facility];  Service: Cardiovascular;  Laterality: Left;  . APPLICATION OF WOUND VAC Bilateral 09/15/2019   Procedure: APPLICATION OF WOUND VAC;  Surgeon: Benjamine Sprague, DO;  Location: [facility];  Service: General;  Laterality: Bilateral;  . AV FISTULA PLACEMENT Left 07/09/2020   Procedure: ARTERIOVENOUS (AV) FISTULA CREATION ( BRACHIAL  CEPHALIC);  Surgeon: Algernon Huxley, MD;  Location: [facility];  Service: Vascular;  Laterality: Left;  . DIALYSIS/PERMA CATHETER INSERTION N/A 02/28/2019   Procedure: DIALYSIS/PERMA CATHETER INSERTION;  Surgeon: Algernon Huxley, MD;  Location: [place] [facility];  Service: Cardiovascular;  Laterality: N/A;  . DIALYSIS/PERMA CATHETER INSERTION N/A 09/17/2019   Procedure: DIALYSIS/PERMA CATHETER EXCHANGE;  Surgeon: Katha Cabal, MD;  Location: [place] [facility];  Service: Cardiovascular;  Laterality: N/A;  . DIALYSIS/PERMA CATHETER REMOVAL N/A 03/18/2020   Procedure: DIALYSIS/PERMA CATHETER REMOVAL;  Surgeon: Algernon Huxley, MD;  Location: [place] [facility];  Service: Cardiovascular;  Laterality: N/A;  . pd cath  05/21/2019  . TONSILLECTOMY    . WOUND DEBRIDEMENT Bilateral 09/15/2019   Procedure: DEBRIDEMENT WOUND;  Surgeon: Benjamine Sprague, DO;  Location: [facility];  Service: General;  Laterality: Bilateral;    Family History  Problem Relation Age of Onset  . Hypertension Mother   . Multiple sclerosis Mother   . CAD Mother   . Cancer Maternal Grandmother        Ovarian   . Cancer Maternal Grandfather        Prostate   . Kidney disease Father     Allergies  Allergen Reactions  . Tomato (Diagnostic) Itching    CBC Latest Ref Rng & Units 09/19/2020 07/09/2020 06/18/2020  WBC 4.0 - 10.5 K/uL 15.1(H) - 16.2(H)  Hemoglobin 12.0 - 15.0 g/dL 11.4(L) 13.9 11.8(L)  Hematocrit 36.0 - 46.0 % 34.3(L) 41.0 36.3  Platelets 150 - 400 K/uL 250 - 303      CMP     Component Value Date/Time   NA 139 09/19/2020 1505   NA 135 (L) 12/31/2013 1020   K 3.2 (L) 09/19/2020 1505   K 3.9 12/31/2013 1020   CL 95 (L) 09/19/2020 1505   CL 105 12/31/2013 1020   CO2 23 09/19/2020 1505   CO2 26 12/31/2013 1020   GLUCOSE 121 (H) 09/19/2020 1505   GLUCOSE 243 (H) 12/31/2013 1020   BUN 58 (H) 09/19/2020 1505   BUN 19 (H) 12/31/2013 1020   CREATININE 12.28 (H) 09/19/2020 1505   CREATININE 2.12 (H)  12/31/2013 1020   CALCIUM 7.6 (L) 09/19/2020 1505   CALCIUM 8.5 12/31/2013 1020   PROT 8.0 09/19/2020 1505   PROT 7.4 12/31/2013 1020   ALBUMIN 3.2 (L) 09/19/2020 1505   ALBUMIN 3.1 (L) 12/31/2013 1020   AST 11 (L) 09/19/2020 1505   AST 21 12/31/2013 1020   ALT 7 09/19/2020 1505   ALT  26 12/31/2013 1020   ALKPHOS 112 09/19/2020 1505   ALKPHOS 98 12/31/2013 1020   BILITOT 0.7 09/19/2020 1505   BILITOT 0.4 12/31/2013 1020   GFRNONAA 4 (L) 09/19/2020 1505   GFRNONAA 31 (L) 12/31/2013 1020   GFRAA 5 (L) 06/18/2020 1217   GFRAA 36 (L) 12/31/2013 1020     VAS [place] ABI WITH/WO TBI  Result Date: 10/27/2020 LOWER EXTREMITY DOPPLER STUDY Indications: Toes and fingers change color.  Performing Technologist: Concha Norway RVT  Examination Guidelines: A complete evaluation includes at minimum, Doppler waveform signals and systolic blood pressure reading at the level of bilateral brachial, anterior tibial, and posterior tibial arteries, when vessel segments are accessible. Bilateral testing is considered an integral part of a complete examination. Photoelectric Plethysmograph (PPG) waveforms and toe systolic pressure readings are included as required and additional duplex testing as needed. Limited examinations for reoccurring indications may be performed as noted.  ABI Findings: +---------+------------------+-----+---------+--------+ Right    Rt Pressure (mmHg)IndexWaveform Comment  +---------+------------------+-----+---------+--------+ Brachial 143                                      +---------+------------------+-----+---------+--------+ ATA      146               1.02 triphasic         +---------+------------------+-----+---------+--------+ PTA      181               1.27 triphasic         +---------+------------------+-----+---------+--------+ Great Toe58                0.41 Abnormal          +---------+------------------+-----+---------+--------+  +---------+------------------+-----+---------+-------+ Left     Lt Pressure (mmHg)IndexWaveform Comment +---------+------------------+-----+---------+-------+ ATA      177               1.24 triphasic        +---------+------------------+-----+---------+-------+ PTA      171               1.20 triphasic        +---------+------------------+-----+---------+-------+ Great Toe63                0.44 Abnormal         +---------+------------------+-----+---------+-------+ Normal main arterial flow, decreased TBI's suggestive of possible Reynaud's.  Summary: Right: Resting right ankle-brachial index is within normal range. No evidence of significant right lower extremity arterial disease. The right toe-brachial index is abnormal. Left: Resting left ankle-brachial index is within normal range. No evidence of significant left lower extremity arterial disease. The left toe-brachial index is abnormal.  *See table(s) above for measurements and observations.  Electronically signed by [name] MD on 10/27/2020 at 1:24:08 PM.   Final        Assessment & Plan:   1. 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  This represents a limb threatening ischemia and places the patient at the risk for limb loss.  Patient should undergo angiography of the left lower extremities with the hope for intervention for limb salvage.  The risks and benefits as well as the alternative therapies was discussed in detail with the patient.  All questions were  answered.  Patient agrees to proceed with angiography.  The patient will follow up with me in the office after the procedure.   We will also place referral to podiatry for evaluation of her left fifth toe for wound. - Ambulatory referral to Podiatry  2.  Primary hypertension Continue antihypertensive medications as already ordered, these medications have been reviewed and there are no changes at this time.   3. ESRD (end stage renal disease) ([place]) Noninvasive studies show that the patient does have a viable left brachiocephalic AV fistula.  The patient is currently maintained via peritoneal dialysis and so the fistula will not be necessary to use regularly.  However, if the patient needs hemodialysis her fistula is currently adequate for use.   Current Outpatient Medications on File Prior to Visit  Medication Sig Dispense Refill  . albuterol (VENTOLIN HFA) 108 (90 Base) MCG/ACT inhaler Inhale into the lungs every 6 (six) hours as needed.     Marland Kitchen amLODipine (NORVASC) 10 MG tablet Take 10 mg by mouth daily.    Marland Kitchen aspirin EC 81 MG EC tablet Take 1 tablet (81 mg total) by mouth daily. 30 tablet 2  . atorvastatin (LIPITOR) 20 MG tablet Take 20 mg by mouth daily.    . carvedilol (COREG) 25 MG tablet Take 1 tablet (25 mg total) by mouth 2 (two) times daily with a meal. 60 tablet 5  . Cholecalciferol (VITAMIN D) 50 MCG (2000 UT) CAPS Take by mouth daily.    . cloNIDine (CATAPRES) 0.2 MG tablet Take 0.2 mg by mouth 3 (three) times daily.    . ferric citrate (AURYXIA) 1 GM 210 MG(Fe) tablet Take 420 mg by mouth 3 (three) times daily.    . ferrous gluconate (FERGON) 324 MG tablet Take 324 mg by mouth daily.    Marland Kitchen gentamicin cream (GARAMYCIN) 0.1 % APPLY TO EXIT SITE DAILY    . isosorbide mononitrate (IMDUR) 30 MG 24 hr tablet Take 1 tablet (30 mg total) by mouth 2 (two) times daily. 60 tablet 2  . KLOR-CON M20 20 MEQ tablet Take 20 mEq by mouth daily.    Marland Kitchen losartan (COZAAR) 50 MG tablet Take 50 mg by mouth daily.    . multivitamin (RENA-VIT) TABS tablet Take 1 tablet by mouth at bedtime. 30 tablet 0  . sodium thiosulfate 25 % injection Inject 12.5 g into the vein once.    . torsemide (DEMADEX) 100 MG tablet Take 100 mg by mouth every morning.    . vitamin  C (VITAMIN C) 500 MG tablet Take 1 tablet (500 mg total) by mouth 2 (two) times daily. 60 tablet 1   No current facility-administered medications on file prior to visit.    There are no Patient Instructions on file for this visit. No follow-ups on file.   Kris Hartmann, NP

## 2020-12-01 NOTE — H&P (View-Only) (Signed)
Subjective:    Patient ID: [name], female    DOB: 05-14-1986, 35 y.o.   MRN: [id number] Chief Complaint  Patient presents with  . Follow-up    [facility] 4 wk post av fistulagram    Patient presents today for evaluation of her left brachiocephalic AV fistula following intervention.  The patient is currently maintained via peritoneal dialysis however a fistula was placed for backup access.  Currently the patient has a flow volume of 2699 with no significant stenosis seen.  However the patient also notes issues with her upper and lower extremities.  She denies signs symptoms of steal syndrome however she does have discoloration of her bilateral fingertips as well as her toes.  This discoloration was consistent with Raynaud's disease.  Previous ABIs done on 10/21/2020 showed a right ABI of 1.27 and a left of 1.24.  The patient had triphasic tibial artery waveforms bilaterally however her TBI was 0.41 on the right and 0.44 on the left with diminished toe waveforms.  The patient subsequently developed blisters on her left fifth toe.  The blister ruptured and the patient currently has a ulcer on her left fifth toe.  It is dry with no significant drainage however it appears necrotic.   Review of Systems  Cardiovascular: Positive for leg swelling.  Skin: Positive for wound.  Hematological: Bruises/bleeds easily.  All other systems reviewed and are negative.      Objective:   Physical Exam Vitals reviewed.  HENT:     Head: Normocephalic.  Cardiovascular:     Rate and Rhythm: Normal rate.     Pulses: Decreased pulses.  Pulmonary:     Effort: Pulmonary effort is normal.  Musculoskeletal:     Right lower leg: Edema present.     Left lower leg: Edema present.       Feet:  Feet:     Left foot:     Skin integrity: Ulcer present.  Neurological:     Mental Status: She is alert and oriented to person, place, and time.  Psychiatric:        Mood and Affect: Mood normal.        Behavior:  Behavior normal.        Thought Content: Thought content normal.        Judgment: Judgment normal.     BP (!) 147/90 (BP Location: Right Arm)   Pulse 91   Resp 16   Wt (!) 306 lb 6.4 oz (139 kg)   BMI 49.45 kg/m   Past Medical History:  Diagnosis Date  . Anemia of chronic disease   . Asthma   . Calciphylaxis 2020  . CHF (congestive heart failure) ([place])   . CKD (chronic kidney disease), stage III ([place])   . Diabetes ([place])    type 2  . Hypertension   . Morbid obesity ([place])     Social History   Socioeconomic History  . Marital status: Single    Spouse name: Not on file  . Number of children: 0  . Years of education: Not on file  . Highest education level: Not on file  Occupational History  . Occupation: works at [employer]  . Smoking status: Former Smoker    Types: Cigars  . Smokeless tobacco: Never Used  Vaping Use  . Vaping Use: Never used  Substance and Sexual Activity  . Alcohol use: No    Alcohol/week: 0.0 standard drinks  . Drug use: No  .  Sexual activity: Yes    Birth control/protection: None  Other Topics Concern  . Not on file  Social History Narrative   Lives with mother    Social Determinants of Health   Financial Resource Strain: Not on file  Food Insecurity: Not on file  Transportation Needs: Not on file  Physical Activity: Not on file  Stress: Not on file  Social Connections: Not on file  Intimate Partner Violence: Not on file    Past Surgical History:  Procedure Laterality Date  . A/V FISTULAGRAM Left 10/29/2020   Procedure: A/V FISTULAGRAM;  Surgeon: Algernon Huxley, MD;  Location: [place] [facility];  Service: Cardiovascular;  Laterality: Left;  . APPLICATION OF WOUND VAC Bilateral 09/15/2019   Procedure: APPLICATION OF WOUND VAC;  Surgeon: Benjamine Sprague, DO;  Location: [facility];  Service: General;  Laterality: Bilateral;  . AV FISTULA PLACEMENT Left 07/09/2020   Procedure: ARTERIOVENOUS (AV) FISTULA CREATION ( BRACHIAL  CEPHALIC);  Surgeon: Algernon Huxley, MD;  Location: [facility];  Service: Vascular;  Laterality: Left;  . DIALYSIS/PERMA CATHETER INSERTION N/A 02/28/2019   Procedure: DIALYSIS/PERMA CATHETER INSERTION;  Surgeon: Algernon Huxley, MD;  Location: [place] [facility];  Service: Cardiovascular;  Laterality: N/A;  . DIALYSIS/PERMA CATHETER INSERTION N/A 09/17/2019   Procedure: DIALYSIS/PERMA CATHETER EXCHANGE;  Surgeon: Katha Cabal, MD;  Location: [place] [facility];  Service: Cardiovascular;  Laterality: N/A;  . DIALYSIS/PERMA CATHETER REMOVAL N/A 03/18/2020   Procedure: DIALYSIS/PERMA CATHETER REMOVAL;  Surgeon: Algernon Huxley, MD;  Location: [place] [facility];  Service: Cardiovascular;  Laterality: N/A;  . pd cath  05/21/2019  . TONSILLECTOMY    . WOUND DEBRIDEMENT Bilateral 09/15/2019   Procedure: DEBRIDEMENT WOUND;  Surgeon: Benjamine Sprague, DO;  Location: [facility];  Service: General;  Laterality: Bilateral;    Family History  Problem Relation Age of Onset  . Hypertension Mother   . Multiple sclerosis Mother   . CAD Mother   . Cancer Maternal Grandmother        Ovarian   . Cancer Maternal Grandfather        Prostate   . Kidney disease Father     Allergies  Allergen Reactions  . Tomato (Diagnostic) Itching    CBC Latest Ref Rng & Units 09/19/2020 07/09/2020 06/18/2020  WBC 4.0 - 10.5 K/uL 15.1(H) - 16.2(H)  Hemoglobin 12.0 - 15.0 g/dL 11.4(L) 13.9 11.8(L)  Hematocrit 36.0 - 46.0 % 34.3(L) 41.0 36.3  Platelets 150 - 400 K/uL 250 - 303      CMP     Component Value Date/Time   NA 139 09/19/2020 1505   NA 135 (L) 12/31/2013 1020   K 3.2 (L) 09/19/2020 1505   K 3.9 12/31/2013 1020   CL 95 (L) 09/19/2020 1505   CL 105 12/31/2013 1020   CO2 23 09/19/2020 1505   CO2 26 12/31/2013 1020   GLUCOSE 121 (H) 09/19/2020 1505   GLUCOSE 243 (H) 12/31/2013 1020   BUN 58 (H) 09/19/2020 1505   BUN 19 (H) 12/31/2013 1020   CREATININE 12.28 (H) 09/19/2020 1505   CREATININE 2.12 (H)  12/31/2013 1020   CALCIUM 7.6 (L) 09/19/2020 1505   CALCIUM 8.5 12/31/2013 1020   PROT 8.0 09/19/2020 1505   PROT 7.4 12/31/2013 1020   ALBUMIN 3.2 (L) 09/19/2020 1505   ALBUMIN 3.1 (L) 12/31/2013 1020   AST 11 (L) 09/19/2020 1505   AST 21 12/31/2013 1020   ALT 7 09/19/2020 1505   ALT  26 12/31/2013 1020   ALKPHOS 112 09/19/2020 1505   ALKPHOS 98 12/31/2013 1020   BILITOT 0.7 09/19/2020 1505   BILITOT 0.4 12/31/2013 1020   GFRNONAA 4 (L) 09/19/2020 1505   GFRNONAA 31 (L) 12/31/2013 1020   GFRAA 5 (L) 06/18/2020 1217   GFRAA 36 (L) 12/31/2013 1020     VAS [place] ABI WITH/WO TBI  Result Date: 10/27/2020 LOWER EXTREMITY DOPPLER STUDY Indications: Toes and fingers change color.  Performing Technologist: Concha Norway RVT  Examination Guidelines: A complete evaluation includes at minimum, Doppler waveform signals and systolic blood pressure reading at the level of bilateral brachial, anterior tibial, and posterior tibial arteries, when vessel segments are accessible. Bilateral testing is considered an integral part of a complete examination. Photoelectric Plethysmograph (PPG) waveforms and toe systolic pressure readings are included as required and additional duplex testing as needed. Limited examinations for reoccurring indications may be performed as noted.  ABI Findings: +---------+------------------+-----+---------+--------+ Right    Rt Pressure (mmHg)IndexWaveform Comment  +---------+------------------+-----+---------+--------+ Brachial 143                                      +---------+------------------+-----+---------+--------+ ATA      146               1.02 triphasic         +---------+------------------+-----+---------+--------+ PTA      181               1.27 triphasic         +---------+------------------+-----+---------+--------+ Great Toe58                0.41 Abnormal          +---------+------------------+-----+---------+--------+  +---------+------------------+-----+---------+-------+ Left     Lt Pressure (mmHg)IndexWaveform Comment +---------+------------------+-----+---------+-------+ ATA      177               1.24 triphasic        +---------+------------------+-----+---------+-------+ PTA      171               1.20 triphasic        +---------+------------------+-----+---------+-------+ Great Toe63                0.44 Abnormal         +---------+------------------+-----+---------+-------+ Normal main arterial flow, decreased TBI's suggestive of possible Reynaud's.  Summary: Right: Resting right ankle-brachial index is within normal range. No evidence of significant right lower extremity arterial disease. The right toe-brachial index is abnormal. Left: Resting left ankle-brachial index is within normal range. No evidence of significant left lower extremity arterial disease. The left toe-brachial index is abnormal.  *See table(s) above for measurements and observations.  Electronically signed by [name] MD on 10/27/2020 at 1:24:08 PM.   Final        Assessment & Plan:   1. 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  This represents a limb threatening ischemia and places the patient at the risk for limb loss.  Patient should undergo angiography of the left lower extremities with the hope for intervention for limb salvage.  The risks and benefits as well as the alternative therapies was discussed in detail with the patient.  All questions were  answered.  Patient agrees to proceed with angiography.  The patient will follow up with me in the office after the procedure.   We will also place referral to podiatry for evaluation of her left fifth toe for wound. - Ambulatory referral to Podiatry  2.  Primary hypertension Continue antihypertensive medications as already ordered, these medications have been reviewed and there are no changes at this time.   3. ESRD (end stage renal disease) ([place]) Noninvasive studies show that the patient does have a viable left brachiocephalic AV fistula.  The patient is currently maintained via peritoneal dialysis and so the fistula will not be necessary to use regularly.  However, if the patient needs hemodialysis her fistula is currently adequate for use.   Current Outpatient Medications on File Prior to Visit  Medication Sig Dispense Refill  . albuterol (VENTOLIN HFA) 108 (90 Base) MCG/ACT inhaler Inhale into the lungs every 6 (six) hours as needed.     Marland Kitchen amLODipine (NORVASC) 10 MG tablet Take 10 mg by mouth daily.    Marland Kitchen aspirin EC 81 MG EC tablet Take 1 tablet (81 mg total) by mouth daily. 30 tablet 2  . atorvastatin (LIPITOR) 20 MG tablet Take 20 mg by mouth daily.    . carvedilol (COREG) 25 MG tablet Take 1 tablet (25 mg total) by mouth 2 (two) times daily with a meal. 60 tablet 5  . Cholecalciferol (VITAMIN D) 50 MCG (2000 UT) CAPS Take by mouth daily.    . cloNIDine (CATAPRES) 0.2 MG tablet Take 0.2 mg by mouth 3 (three) times daily.    . ferric citrate (AURYXIA) 1 GM 210 MG(Fe) tablet Take 420 mg by mouth 3 (three) times daily.    . ferrous gluconate (FERGON) 324 MG tablet Take 324 mg by mouth daily.    Marland Kitchen gentamicin cream (GARAMYCIN) 0.1 % APPLY TO EXIT SITE DAILY    . isosorbide mononitrate (IMDUR) 30 MG 24 hr tablet Take 1 tablet (30 mg total) by mouth 2 (two) times daily. 60 tablet 2  . KLOR-CON M20 20 MEQ tablet Take 20 mEq by mouth daily.    Marland Kitchen losartan (COZAAR) 50 MG tablet Take 50 mg by mouth daily.    . multivitamin (RENA-VIT) TABS tablet Take 1 tablet by mouth at bedtime. 30 tablet 0  . sodium thiosulfate 25 % injection Inject 12.5 g into the vein once.    . torsemide (DEMADEX) 100 MG tablet Take 100 mg by mouth every morning.    . vitamin  C (VITAMIN C) 500 MG tablet Take 1 tablet (500 mg total) by mouth 2 (two) times daily. 60 tablet 1   No current facility-administered medications on file prior to visit.    There are no Patient Instructions on file for this visit. No follow-ups on file.   Kris Hartmann, NP

## 2020-12-02 ENCOUNTER — Encounter: Payer: Self-pay | Admitting: Vascular Surgery

## 2020-12-02 ENCOUNTER — Ambulatory Visit
Admission: RE | Admit: 2020-12-02 | Discharge: 2020-12-02 | Disposition: A | Discharge status: Home / Self Care | Payer: Medicare Other | Attending: Vascular Surgery | Admitting: Vascular Surgery

## 2020-12-02 ENCOUNTER — Encounter
Admission: RE | Disposition: A | Discharge status: Home / Self Care | Payer: Self-pay | Source: Home / Self Care | Attending: Vascular Surgery

## 2020-12-02 DIAGNOSIS — Z79899 Other long term (current) drug therapy: Secondary | ICD-10-CM | POA: Diagnosis not present

## 2020-12-02 DIAGNOSIS — E11621 Type 2 diabetes mellitus with foot ulcer: Secondary | ICD-10-CM | POA: Diagnosis not present

## 2020-12-02 DIAGNOSIS — Z7982 Long term (current) use of aspirin: Secondary | ICD-10-CM | POA: Diagnosis not present

## 2020-12-02 DIAGNOSIS — Z87891 Personal history of nicotine dependence: Secondary | ICD-10-CM | POA: Diagnosis not present

## 2020-12-02 DIAGNOSIS — I70245 Atherosclerosis of native arteries of left leg with ulceration of other part of foot: Secondary | ICD-10-CM | POA: Insufficient documentation

## 2020-12-02 DIAGNOSIS — L97529 Non-pressure chronic ulcer of other part of left foot with unspecified severity: Secondary | ICD-10-CM | POA: Diagnosis not present

## 2020-12-02 DIAGNOSIS — Z992 Dependence on renal dialysis: Secondary | ICD-10-CM | POA: Insufficient documentation

## 2020-12-02 DIAGNOSIS — N186 End stage renal disease: Secondary | ICD-10-CM | POA: Insufficient documentation

## 2020-12-02 DIAGNOSIS — E1151 Type 2 diabetes mellitus with diabetic peripheral angiopathy without gangrene: Secondary | ICD-10-CM | POA: Diagnosis present

## 2020-12-02 DIAGNOSIS — I120 Hypertensive chronic kidney disease with stage 5 chronic kidney disease or end stage renal disease: Secondary | ICD-10-CM | POA: Diagnosis not present

## 2020-12-02 DIAGNOSIS — I70244 Atherosclerosis of native arteries of left leg with ulceration of heel and midfoot: Secondary | ICD-10-CM | POA: Diagnosis not present

## 2020-12-02 DIAGNOSIS — I70299 Other atherosclerosis of native arteries of extremities, unspecified extremity: Secondary | ICD-10-CM

## 2020-12-02 HISTORY — PX: LOWER EXTREMITY ANGIOGRAPHY: CATH118251

## 2020-12-02 SURGERY — LOWER EXTREMITY ANGIOGRAPHY
Anesthesia: Moderate Sedation | Site: Leg Lower | Laterality: Left

## 2020-12-02 MED ORDER — IODIXANOL 320 MG/ML IV SOLN
INTRAVENOUS | Status: DC | PRN
  Administered 2020-12-02: 45 mL via INTRA_ARTERIAL

## 2020-12-02 MED ORDER — CEFAZOLIN SODIUM-DEXTROSE 1-4 GM/50ML-% IV SOLN
INTRAVENOUS | Status: AC
  Filled 2020-12-02: qty 50

## 2020-12-02 MED ORDER — SODIUM CHLORIDE 0.9 % IV SOLN: INTRAVENOUS | Status: DC

## 2020-12-02 MED ORDER — ACETAMINOPHEN 325 MG PO TABS: 650.0000 mg | ORAL_TABLET | ORAL | Status: DC | PRN

## 2020-12-02 MED ORDER — HYDROMORPHONE HCL 1 MG/ML IJ SOLN: 1.0000 mg | Freq: Once | INTRAMUSCULAR | Status: DC | PRN

## 2020-12-02 MED ORDER — FENTANYL CITRATE (PF) 100 MCG/2ML IJ SOLN
INTRAMUSCULAR | Status: AC
  Filled 2020-12-02: qty 2

## 2020-12-02 MED ORDER — FAMOTIDINE 20 MG PO TABS: 40.0000 mg | ORAL_TABLET | Freq: Once | ORAL | Status: DC | PRN

## 2020-12-02 MED ORDER — MIDAZOLAM HCL 2 MG/ML PO SYRP: 8.0000 mg | ORAL_SOLUTION | Freq: Once | ORAL | Status: DC | PRN

## 2020-12-02 MED ORDER — CLOPIDOGREL BISULFATE 75 MG PO TABS: 75.0000 mg | ORAL_TABLET | Freq: Every day | ORAL | 11 refills | Status: DC

## 2020-12-02 MED ORDER — LABETALOL HCL 5 MG/ML IV SOLN: 10.0000 mg | INTRAVENOUS | Status: DC | PRN

## 2020-12-02 MED ORDER — MIDAZOLAM HCL 2 MG/2ML IJ SOLN
INTRAMUSCULAR | Status: DC | PRN
  Administered 2020-12-02: 2 mg via INTRAVENOUS
  Administered 2020-12-02 (×2): 1 mg via INTRAVENOUS

## 2020-12-02 MED ORDER — METHYLPREDNISOLONE SODIUM SUCC 125 MG IJ SOLR: 125.0000 mg | Freq: Once | INTRAMUSCULAR | Status: DC | PRN

## 2020-12-02 MED ORDER — HEPARIN SODIUM (PORCINE) 1000 UNIT/ML IJ SOLN
INTRAMUSCULAR | Status: DC | PRN
  Administered 2020-12-02: 4000 [IU] via INTRAVENOUS

## 2020-12-02 MED ORDER — ONDANSETRON HCL 4 MG/2ML IJ SOLN: 4.0000 mg | Freq: Four times a day (QID) | INTRAMUSCULAR | Status: DC | PRN

## 2020-12-02 MED ORDER — SODIUM CHLORIDE 0.9% FLUSH: 3.0000 mL | Freq: Two times a day (BID) | INTRAVENOUS | Status: DC

## 2020-12-02 MED ORDER — SODIUM CHLORIDE 0.9 % IV SOLN: 250.0000 mL | INTRAVENOUS | Status: DC | PRN

## 2020-12-02 MED ORDER — MIDAZOLAM HCL 5 MG/5ML IJ SOLN
INTRAMUSCULAR | Status: AC
  Filled 2020-12-02: qty 5

## 2020-12-02 MED ORDER — FENTANYL CITRATE (PF) 100 MCG/2ML IJ SOLN
INTRAMUSCULAR | Status: DC | PRN
  Administered 2020-12-02: 50 ug via INTRAVENOUS
  Administered 2020-12-02 (×2): 25 ug via INTRAVENOUS

## 2020-12-02 MED ORDER — HYDRALAZINE HCL 20 MG/ML IJ SOLN: 5.0000 mg | INTRAMUSCULAR | Status: DC | PRN

## 2020-12-02 MED ORDER — CLOPIDOGREL BISULFATE 75 MG PO TABS: 75.0000 mg | ORAL_TABLET | Freq: Every day | ORAL | Status: DC

## 2020-12-02 MED ORDER — CEFAZOLIN SODIUM-DEXTROSE 1-4 GM/50ML-% IV SOLN
1.0000 g | Freq: Once | INTRAVENOUS | Status: DC
  Filled 2020-12-02: qty 50

## 2020-12-02 MED ORDER — SODIUM CHLORIDE 0.9% FLUSH: 3.0000 mL | INTRAVENOUS | Status: DC | PRN

## 2020-12-02 MED ORDER — DIPHENHYDRAMINE HCL 50 MG/ML IJ SOLN: 50.0000 mg | Freq: Once | INTRAMUSCULAR | Status: DC | PRN

## 2020-12-02 SURGICAL SUPPLY — 14 items
BALLN ULTRVRSE 2.5X300X150 (BALLOONS) ×2
BALLOON ULTRVRSE 2.5X300X150 (BALLOONS) ×1 IMPLANT
CATH ANGIO 5F PIGTAIL 65CM (CATHETERS) ×2 IMPLANT
CATH VERT 5X100 (CATHETERS) ×2 IMPLANT
DEVICE STARCLOSE SE CLOSURE (Vascular Products) ×2 IMPLANT
GLIDEWIRE ADV .035X260CM (WIRE) ×2 IMPLANT
KIT ENCORE 26 ADVANTAGE (KITS) ×2 IMPLANT
PACK ANGIOGRAPHY (CUSTOM PROCEDURE TRAY) ×2 IMPLANT
SHEATH BRITE TIP 5FRX11 (SHEATH) ×2 IMPLANT
SHEATH RAABE 6FRX70 (SHEATH) ×2 IMPLANT
SYR MEDRAD MARK 7 150ML (SYRINGE) ×2 IMPLANT
TUBING CONTRAST HIGH PRESS 72 (TUBING) ×2 IMPLANT
WIRE G V18X300CM (WIRE) ×2 IMPLANT
WIRE GUIDERIGHT .035X150 (WIRE) ×2 IMPLANT

## 2020-12-02 NOTE — Discharge Instructions (Signed)
Angiogram, Care After This sheet gives you information about how to care for yourself after your procedure. Your doctor may also give you more specific instructions. If you have problems or questions, contact your doctor. What can I expect after the procedure? After the procedure, it is common to have these problems at the point where the catheter was inserted:  Bruising.  Tenderness.  A collection of blood (hematoma). This may feel like a small lump under the skin at the insertion site. Follow these instructions at home: Insertion site care  Follow instructions from your doctor about how to take care of the area where the catheter was inserted. Make sure you: ? Wash your hands with soap and water before you change your bandage (dressing). If you cannot use soap and water, use hand sanitizer. ? Change your bandage as told by your doctor.  Do not take baths, swim, or use a hot tub until your doctor says it is okay.  You may shower 24-48 hours after the procedure, or as told by your doctor. To clean the area: ? Gently wash the area with plain soap and water. ? Pat the area dry with a clean towel. ? Do not rub the area. This may cause bleeding.  Check your insertion area every day for signs of infection. Check for: ? Redness, swelling, or pain. ? Fluid or blood. ? Warmth. ? Pus or a bad smell.  Do not apply powder or lotion to the area. Keep the area clean and dry.   Activity  Do not drive for 24 hours if you were given a medicine to help you relax (sedative).  Rest as told by your doctor, usually for 1-2 days.  Do not lift anything that is heavier than 10 lbs. (4.5 kg) or as told by your doctor.  If your insertion site was in your leg, try to avoid stairs for a few days.  Return to your normal activities as told by your doctor. Ask your doctor what activities are safe for you. General instructions  If the insertion area starts to bleed, lie flat and put pressure on the area.  If the bleeding does not stop, get help right away. This is an emergency.  Take over-the-counter and prescription medicines only as told by your doctor.  Drink enough fluid to keep your pee (urine) pale yellow.  Keep all follow-up visits as told by your doctor. This is important.   Contact a doctor if:  You have a fever.  You have chills.  You have redness, swelling, or pain around your insertion area.  You have fluid or blood coming from your insertion area.  The insertion area feels warm to the touch.  You have pus or a bad smell coming from your insertion area.  You have more bruising around the insertion area. Get help right away if:  You have a lot of pain in the insertion area.  The insertion area swells very fast.  The insertion area is bleeding, and the bleeding does not stop after you hold steady pressure on the area.  The area around the insertion area becomes pale, cool, tingly, or numb.  You have chest pain.  You have trouble breathing.  You have a rash.  You have any signs of a stroke. "BE FAST" is an easy way to remember the main warning signs: ? B - Balance. Signs are dizziness, sudden trouble walking, or loss of balance. ? E - Eyes. Signs are trouble seeing or a change  in how you see. ? F - Face. Signs are sudden weakness or loss of feeling of the face, or the face or eyelid drooping on one side. ? A - Arms. Signs are weakness or loss of feeling in an arm. This happens suddenly and usually on one side of the body. ? S - Speech. Signs are sudden trouble speaking, slurred speech, or trouble understanding what people say. ? T - Time. Time to call emergency services. Write down what time symptoms started.  You have other signs of a stroke, such as: ? A sudden, very bad headache with no known cause. ? Feeling like you may vomit (nausea). ? Vomiting. ? A seizure. These symptoms may be an emergency. Do not wait to see if the symptoms will go away. Get  medical help right away. Call your local emergency services (911 in the [place] Do not drive yourself to the hospital. Summary  After the procedure, it is common to have bruising and tenderness at the insertion area.  Do not take baths, swim, or use a hot tub until your doctor says it is okay to do so. You may shower 24-48 hours after the procedure or as told by your doctor.  It is important to rest and drink plenty of fluids.  If the insertion area starts to bleed, lie flat and put pressure on the area. If the bleeding does not stop, get help right away. This is an emergency. This information is not intended to replace advice given to you by your health care provider. Make sure you discuss any questions you have with your health care provider. Document Revised: 08/07/2019 Document Reviewed: 08/07/2019 Elsevier Patient Education  2021 [place]. Femoral Site Care  This sheet gives you information about how to care for yourself after your procedure. Your health care provider may also give you more specific instructions. If you have problems or questions, contact your health care provider. What can I expect after the procedure? After the procedure, it is common to have:  Bruising that usually fades within 1-2 weeks.  Tenderness at the site. Follow these instructions at home: Wound care  Follow instructions from your health care provider about how to take care of your insertion site. Make sure you: ? Wash your hands with soap and water before you change your bandage (dressing). If soap and water are not available, use hand sanitizer. ? Change your dressing as told by your health care provider. ? Leave stitches (sutures), skin glue, or adhesive strips in place. These skin closures may need to stay in place for 2 weeks or longer. If adhesive strip edges start to loosen and curl up, you may trim the loose edges. Do not remove adhesive strips completely unless your health care provider tells  you to do that.  Do not take baths, swim, or use a hot tub until your health care provider approves.  You may shower 24-48 hours after the procedure or as told by your health care provider. ? Gently wash the site with plain soap and water. ? Pat the area dry with a clean towel. ? Do not rub the site. This may cause bleeding.  Do not apply powder or lotion to the site. Keep the site clean and dry.  Check your femoral site every day for signs of infection. Check for: ? Redness, swelling, or pain. ? Fluid or blood. ? Warmth. ? Pus or a bad smell. Activity  For the first 2-3 days after your procedure, or as  long as directed: ? Avoid climbing stairs as much as possible. ? Do not squat.  Do not lift anything that is heavier than 10 lb (4.5 kg), or the limit that you are told, until your health care provider says that it is safe.  Rest as directed. ? Avoid sitting for a long time without moving. Get up to take short walks every 1-2 hours.  Do not drive for 24 hours if you were given a medicine to help you relax (sedative). General instructions  Take over-the-counter and prescription medicines only as told by your health care provider.  Keep all follow-up visits as told by your health care provider. This is important. Contact a health care provider if you have:  A fever or chills.  You have redness, swelling, or pain around your insertion site. Get help right away if:  The catheter insertion area swells very fast.  You pass out.  You suddenly start to sweat or your skin gets clammy.  The catheter insertion area is bleeding, and the bleeding does not stop when you hold steady pressure on the area.  The area near or just beyond the catheter insertion site becomes pale, cool, tingly, or numb. These symptoms may represent a serious problem that is an emergency. Do not wait to see if the symptoms will go away. Get medical help right away. Call your local emergency services (911 in  the [place] Do not drive yourself to the hospital. Summary  After the procedure, it is common to have bruising that usually fades within 1-2 weeks.  Check your femoral site every day for signs of infection.  Do not lift anything that is heavier than 10 lb (4.5 kg), or the limit that you are told, until your health care provider says that it is safe. This information is not intended to replace advice given to you by your health care provider. Make sure you discuss any questions you have with your health care provider. Document Revised: 06/05/2020 Document Reviewed: 06/05/2020 Elsevier Patient Education  2021 [place]. Moderate Conscious Sedation, Adult, Care After This sheet gives you information about how to care for yourself after your procedure. Your health care provider may also give you more specific instructions. If you have problems or questions, contact your health care provider. What can I expect after the procedure? After the procedure, it is common to have:  Sleepiness for several hours.  Impaired judgment for several hours.  Difficulty with balance.  Vomiting if you eat too soon. Follow these instructions at home: For the time period you were told by your health care provider:  Rest.  Do not participate in activities where you could fall or become injured.  Do not drive or use machinery.  Do not drink alcohol.  Do not take sleeping pills or medicines that cause drowsiness.  Do not make important decisions or sign legal documents.  Do not take care of children on your own.      Eating and drinking  Follow the diet recommended by your health care provider.  Drink enough fluid to keep your urine pale yellow.  If you vomit: ? Drink water, juice, or soup when you can drink without vomiting. ? Make sure you have little or no nausea before eating solid foods.   General instructions  Take over-the-counter and prescription medicines only as told by your  health care provider.  Have a responsible adult stay with you for the time you are told. It is important to have someone  help care for you until you are awake and alert.  Do not smoke.  Keep all follow-up visits as told by your health care provider. This is important. Contact a health care provider if:  You are still sleepy or having trouble with balance after 24 hours.  You feel light-headed.  You keep feeling nauseous or you keep vomiting.  You develop a rash.  You have a fever.  You have redness or swelling around the IV site. Get help right away if:  You have trouble breathing.  You have new-onset confusion at home. Summary  After the procedure, it is common to feel sleepy, have impaired judgment, or feel nauseous if you eat too soon.  Rest after you get home. Know the things you should not do after the procedure.  Follow the diet recommended by your health care provider and drink enough fluid to keep your urine pale yellow.  Get help right away if you have trouble breathing or new-onset confusion at home. This information is not intended to replace advice given to you by your health care provider. Make sure you discuss any questions you have with your health care provider. Document Revised: 01/31/2020 Document Reviewed: 08/29/2019 Elsevier Patient Education  2021 [employer].

## 2020-12-02 NOTE — Interval H&P Note (Signed)
History and Physical Interval Note:  12/02/2020 3:32 PM  [name]  has presented today for surgery, with the diagnosis of LT lower extremity angio   BARD    ASO w ulceration Covid  Feb 15.  The various methods of treatment have been discussed with the patient and family. After consideration of risks, benefits and other options for treatment, the patient has consented to  Procedure(s): LOWER EXTREMITY ANGIOGRAPHY (Left) as a surgical intervention.  The patient's history has been reviewed, patient examined, no change in status, stable for surgery.  I have reviewed the patient's chart and labs.  Questions were answered to the patient's satisfaction.     [name]

## 2020-12-02 NOTE — Op Note (Signed)
[facility]  Percutaneous Study/Intervention Procedural Note   Date of Surgery: 12/02/2020  Surgeon(s):,    Assistants:none  Pre-operative Diagnosis: PAD with ulceration left lower extremity  Post-operative diagnosis:  Same  Procedure(s) Performed:             1.  Ultrasound guidance for vascular access right femoral artery             2.  Catheter placement into left anterior tibial and left tibioperoneal trunk arteries from right femoral approach             3.  Aortogram and selective left lower extremity angiogram including selective images of the left anterior tibial artery and the left tibioperoneal trunk             4.  Percutaneous transluminal angioplasty of anterior tibial artery with 2 inflations with a 2.5 mm diameter by 30 cm length angioplasty balloon             5.   Percutaneous transluminal angioplasty of the left posterior tibial artery with a 2.5 mm diameter by 30 cm length angioplasty balloon  6.  StarClose closure device right femoral artery  EBL: 10 cc  Contrast: 45 cc  Fluoro Time: 6.3 minutes  Moderate Conscious Sedation Time: approximately 40 minutes using 4 mg of Versed and 100 mcg of Fentanyl              Indications:  Patient is a 35 y.o.female with worsening ulcerations on the left foot. The patient has noninvasive study showing diminished flow distally. The patient is brought in for angiography for further evaluation and potential treatment.  Due to the limb threatening nature of the situation, angiogram was performed for attempted limb salvage. The patient is aware that if the procedure fails, amputation would be expected.  The patient also understands that even with successful revascularization, amputation may still be required due to the severity of the situation.  Risks and benefits are discussed and informed consent is obtained.   Procedure:  The patient was identified and appropriate procedural time out was performed.   The patient was then placed supine on the table and prepped and draped in the usual sterile fashion. Moderate conscious sedation was administered during a face to face encounter with the patient throughout the procedure with my supervision of the RN administering medicines and monitoring the patient's vital signs, pulse oximetry, telemetry and mental status throughout from the start of the procedure until the patient was taken to the recovery room. Ultrasound was used to evaluate the right common femoral artery.  It was patent .  A digital ultrasound image was acquired.  A Seldinger needle was used to access the right common femoral artery under direct ultrasound guidance and a permanent image was performed.  A 0.035 J wire was advanced without resistance and a 5Fr sheath was placed.  Pigtail catheter was placed into the aorta and an AP aortogram was performed. This demonstrated normal renal arteries and normal aorta and iliac segments without significant stenosis. I then crossed the aortic bifurcation and advanced to the left femoral head. Selective left lower extremity angiogram was then performed. This demonstrated that the common femoral artery, profunda femoris artery, superficial femoral artery, and popliteal artery were all calcified but without focal stenosis of significance.  There was a normal tibial trifurcation.  Initially, the tibial vessels were somewhat difficult to opacify distally but were highly calcified and relatively small.  I advanced a Kumpe catheter  down into the anterior tibial artery where selective imaging showed several areas of greater than 70% stenosis from the proximal segment throughout the mid and distal segment and into the ankle.  The Kumpe catheter was then put down into the tibioperoneal trunk where imaging was performed showing the peroneal artery be small with poor flow distally.  The posterior tibial artery was a larger vessel that had about a 70% stenosis in the midsegment  and greater than 90% near occlusive stenosis in the distal segment just above the ankle. It was felt that it was in the patient's best interest to proceed with intervention after these images to avoid a second procedure and a larger amount of contrast and fluoroscopy based off of the findings from the initial angiogram. The patient was systemically heparinized and a 6 French 70 cm centimeters sheath was then placed over the Terumo Advantage wire. I then used a Kumpe catheter and the advantage wire to get into the anterior tibial artery and then exchanged for a V 18 wire which was then parked in the foot.  I then performed 2 inflations with the 2.5 mm diameter by 30 cm length angioplasty balloon that went all the way from the dorsalis pedis artery in the foot up to the origin of the anterior tibial artery.  The more distal inflation was to 12 atm for 1 minute the more proximal inflation was 14 atm for 1 minute.  Completion imaging then showed marked improvement with brisk flow and less than 20% residual stenosis in the anterior tibial artery.  I then turned my attention the posterior tibial artery.  Using the V 18 wire I was able to easily cross the lesions in the posterior tibial artery and parked the wire in the foot.  In the mid and distal posterior tibial artery where the lesions were present, the 2.5 mm diameter by 30 cm length angioplasty balloon was inflated to 14 atm for 1 minute.  This resulted in less than 20% residual stenosis and significant improvement in the flow distally. I elected to terminate the procedure. The sheath was removed and StarClose closure device was deployed in the right femoral artery with excellent hemostatic result. The patient was taken to the recovery room in stable condition having tolerated the procedure well.  Findings:               Aortogram:  Normal aorta and iliac arteries without significant stenosis.  Renal arteries did not have any significant stenosis.              Left lower Extremity:  Common femoral artery, profunda femoris artery, superficial femoral artery, and popliteal artery were all calcified but without focal stenosis of significance.  There was a normal tibial trifurcation.  Initially, the tibial vessels were somewhat difficult to opacify distally but were highly calcified and relatively small.  I advanced a Kumpe catheter down into the anterior tibial artery where selective imaging showed several areas of greater than 70% stenosis from the proximal segment throughout the mid and distal segment and into the ankle.  The Kumpe catheter was then put down into the tibioperoneal trunk where imaging was performed showing the peroneal artery be small with poor flow distally.  The posterior tibial artery was a larger vessel that had about a 70% stenosis in the midsegment and greater than 90% near occlusive stenosis in the distal segment just above the ankle.   Disposition: Patient was taken to the recovery room in stable condition having tolerated  the procedure well.  Complications: None  [name] 12/02/2020 4:48 PM   This note was created with Dragon Medical transcription system. Any errors in dictation are purely unintentional.

## 2020-12-03 ENCOUNTER — Encounter: Payer: Self-pay | Admitting: Vascular Surgery

## 2020-12-07 ENCOUNTER — Other Ambulatory Visit: Payer: Self-pay

## 2020-12-07 ENCOUNTER — Ambulatory Visit (INDEPENDENT_AMBULATORY_CARE_PROVIDER_SITE_OTHER): Payer: Medicare Other

## 2020-12-07 ENCOUNTER — Ambulatory Visit (INDEPENDENT_AMBULATORY_CARE_PROVIDER_SITE_OTHER): Payer: Medicare Other | Admitting: Podiatry

## 2020-12-07 ENCOUNTER — Encounter: Payer: Self-pay | Admitting: Podiatry

## 2020-12-07 DIAGNOSIS — N186 End stage renal disease: Secondary | ICD-10-CM

## 2020-12-07 DIAGNOSIS — I96 Gangrene, not elsewhere classified: Secondary | ICD-10-CM | POA: Diagnosis not present

## 2020-12-07 DIAGNOSIS — I739 Peripheral vascular disease, unspecified: Secondary | ICD-10-CM

## 2020-12-07 DIAGNOSIS — Z992 Dependence on renal dialysis: Secondary | ICD-10-CM

## 2020-12-07 MED ORDER — "NITROGLYCERIN NICU 2% OINTMENT ": 1.0000 "application " | TOPICAL_OINTMENT | Freq: Two times a day (BID) | TRANSDERMAL | 0 refills | Status: DC

## 2020-12-07 NOTE — Progress Notes (Signed)
  Subjective:  Patient ID: [name], female    DOB: 1986-03-20,  MRN: [id number]  Chief Complaint  Patient presents with  . Foot Ulcer    35 y.o. female presents with the above complaint. History confirmed with patient.  She is referred to me by Eulogio Ditch, NP from [facility].  She recently underwent an angiogram and stenting with angioplasty of the left lower extremity 1 week ago with Dr. [name].  She had 60-90% stenosis in the AT and PT and he has open to these with inline flow through the foot.  She developed a blister a few weeks ago and the fifth toe has become necrotic.  Her toes often feel cold and clammy and they are concerned for Raynaud's phenomenon.  Objective:  Physical Exam: The left foot is warm and well-perfused.  There is some purplish hue and mottling of the toes on the left side.  Not very cool to touch.  There is dry gangrenous patches on the distal medial tip of the fifth toe, no signs of acute infection.  No exposed bone tendon or joint.  No images are attached to the encounter.  Radiographs: X-ray of the left foot: No evidence of osteolysis or osteomyelitis in the fifth toe, there is diffuse calcifications within the soft tissues Assessment:   1. Gangrene of toe of left foot ([place])      Plan:  Patient was evaluated and treated and all questions answered.  Reviewed the findings by Dr. Carlyle Dolly as well as the clinical exam findings with the patient.  Currently she has dry gangrene.  He seems to have gotten quite a good improvement in blood flow to her left foot, and is now no longer cold and is warm and well-perfused.  Will like to reevaluate her in 1 to 2 weeks and see if it is going to heal or not.  She may require fifth toe amputation.  I think possibly calciphylaxis also playing a factor here.  She is at high risk for possible amputation or infection  Return in about 9 days (around 12/16/2020).

## 2020-12-16 ENCOUNTER — Encounter: Payer: Self-pay | Admitting: Podiatry

## 2020-12-16 ENCOUNTER — Other Ambulatory Visit: Payer: Self-pay | Admitting: Podiatry

## 2020-12-16 ENCOUNTER — Ambulatory Visit (INDEPENDENT_AMBULATORY_CARE_PROVIDER_SITE_OTHER): Payer: Medicare Other | Admitting: Podiatry

## 2020-12-16 ENCOUNTER — Other Ambulatory Visit: Payer: Self-pay

## 2020-12-16 DIAGNOSIS — N186 End stage renal disease: Secondary | ICD-10-CM

## 2020-12-16 DIAGNOSIS — I96 Gangrene, not elsewhere classified: Secondary | ICD-10-CM | POA: Diagnosis not present

## 2020-12-16 DIAGNOSIS — I739 Peripheral vascular disease, unspecified: Secondary | ICD-10-CM | POA: Diagnosis not present

## 2020-12-16 DIAGNOSIS — Z992 Dependence on renal dialysis: Secondary | ICD-10-CM

## 2020-12-19 LAB — WOUND CULTURE

## 2020-12-20 NOTE — Progress Notes (Signed)
  Subjective:  Patient ID: [name], female    DOB: 1986/05/20,  MRN: [id number]  Chief Complaint  Patient presents with  . Foot Ulcer    "I think its about the same"    35 y.o. female returns with the above complaint. History confirmed with patient.  She has been applying Betadine  Objective:  Physical Exam: The left foot is warm and well-perfused.  There is some purplish hue and mottling of the toes on the left side.  Not very cool to touch.  Fifth toe gangrene begins to progress and demarcate, the medial side is becoming wet, no signs of acute infection.  No exposed bone tendon or joint.    Radiographs: X-ray of the left foot: No evidence of osteolysis or osteomyelitis in the fifth toe, there is diffuse calcifications within the soft tissues Assessment:   1. Gangrene of toe of left foot ([place])   2. ESRD (end stage renal disease) on dialysis ([place])   3. Peripheral arterial disease ([facility])   4. Calciphylaxis      Plan:  Patient was evaluated and treated and all questions answered.  Reviewed with her the progression of the gangrene has continued to worsen, I do not think this is going to heal on its own at this point.  I think that she is likely going to lose his toe and she is at risk for further amputation due to her severe peripheral vascular disease as well as her calciphylaxis and ESRD.  Today we discussed amputation of the fifth toe.  We discussed the risk, benefits and potential complications.  We reviewed informed consent and will plan for this in 2 weeks.  I like to see her back the week of surgery to evaluate if it is getting to get better or not, if it is we could cancel the surgery but I think it be best to have her on the schedule.  No follow-ups on file.

## 2020-12-21 ENCOUNTER — Telehealth: Payer: Self-pay | Admitting: *Deleted

## 2020-12-21 NOTE — Telephone Encounter (Signed)
-----   Message from Criselda Peaches, DPM sent at 12/17/2020  7:30 AM EST ----- Regarding: Appointment Hi could you schedule a f/u appointment on 3/16 for a pre op visit to check her toe before surgery? Thanks!

## 2020-12-21 NOTE — Telephone Encounter (Signed)
I am calling on behalf of Dr. Sherryle Lis.  He would like to see you on 12/30/2020,, prior to your surgery date, .  Can we schedule that appointment?  "Yes, that's fine."  Would you prefer morning or afternoon?  "Let's schedule it for the afternoon."  We can schedule you for 3:15 pm.  "That is fine."

## 2020-12-30 ENCOUNTER — Other Ambulatory Visit: Payer: Self-pay

## 2020-12-30 ENCOUNTER — Encounter: Payer: Self-pay | Admitting: Podiatry

## 2020-12-30 ENCOUNTER — Ambulatory Visit (INDEPENDENT_AMBULATORY_CARE_PROVIDER_SITE_OTHER): Payer: Medicare Other | Admitting: Podiatry

## 2020-12-30 DIAGNOSIS — I96 Gangrene, not elsewhere classified: Secondary | ICD-10-CM

## 2021-01-01 ENCOUNTER — Other Ambulatory Visit: Payer: Self-pay | Admitting: Podiatry

## 2021-01-01 DIAGNOSIS — I96 Gangrene, not elsewhere classified: Secondary | ICD-10-CM | POA: Diagnosis not present

## 2021-01-01 MED ORDER — HYDROCODONE-ACETAMINOPHEN 5-325 MG PO TABS: ORAL_TABLET | ORAL | 0 refills | Status: DC

## 2021-01-01 NOTE — Progress Notes (Signed)
[place] 01/01/21 left 5th toe amputation

## 2021-01-03 NOTE — Progress Notes (Signed)
  Subjective:  Patient ID: [name], female    DOB: Apr 13, 1986,  MRN: [id number]  Chief Complaint  Patient presents with  . Wound Check    "its getting worse"    35 y.o. female returns with the above complaint. History confirmed with patient.  Feels like it has not become any better  Objective:  Physical Exam: The left foot is warm and well-perfused.  There is some purplish hue and mottling of the toes on the left side.  Not very cool to touch.  Fifth toe continues to deteriorate   Radiographs: X-ray of the left foot: No evidence of osteolysis or osteomyelitis in the fifth toe, there is diffuse calcifications within the soft tissues Assessment:   No diagnosis found.   Plan:  Patient was evaluated and treated and all questions answered.  Discussed with her we should continue with our plan for amputation.  Surgery planned for Friday  No follow-ups on file.

## 2021-01-06 ENCOUNTER — Other Ambulatory Visit: Payer: Self-pay

## 2021-01-06 ENCOUNTER — Ambulatory Visit (INDEPENDENT_AMBULATORY_CARE_PROVIDER_SITE_OTHER): Payer: Medicare Other

## 2021-01-06 ENCOUNTER — Encounter: Payer: Self-pay | Admitting: Podiatry

## 2021-01-06 ENCOUNTER — Ambulatory Visit (INDEPENDENT_AMBULATORY_CARE_PROVIDER_SITE_OTHER): Payer: Medicare Other | Admitting: Podiatry

## 2021-01-06 VITALS — BP 149/87 | Temp 97.9°F

## 2021-01-06 DIAGNOSIS — Z9889 Other specified postprocedural states: Secondary | ICD-10-CM

## 2021-01-06 DIAGNOSIS — I96 Gangrene, not elsewhere classified: Secondary | ICD-10-CM | POA: Diagnosis not present

## 2021-01-06 DIAGNOSIS — Z89422 Acquired absence of other left toe(s): Secondary | ICD-10-CM | POA: Diagnosis not present

## 2021-01-06 NOTE — Progress Notes (Signed)
  Subjective:  Patient ID: [name], female    DOB: 06-27-1986,  MRN: [id number]  Chief Complaint  Patient presents with  . Routine Post Op    POV #1 DOS 01/01/2021 LT 5TH TOE AMPUTATION  "Im doing ok, the pain meds are not really working.  My foot is achy"    DOS: 01/01/21 Procedure: amputation L 5th toe  35 y.o. female returns for post-op check. Doing well, having some pain  But getting better  Review of Systems: Negative except as noted in the HPI. Denies N/V/F/Ch.   Objective:   Vitals:   01/06/21 1328  BP: (!) 149/87  Temp: 97.9 F (36.6 C)   There is no height or weight on file to calculate BMI. Constitutional Well developed. Well nourished.  Vascular Foot warm and well perfused. Capillary refill normal to all digits.   Neurologic Normal speech. Oriented to person, place, and time. Epicritic sensation to light touch grossly present bilaterally.  Dermatologic Skin healing well without signs of infection. Skin edges well coapted without signs of infection.  Orthopedic: Tenderness to palpation noted about the surgical site.   Radiographs: s/p amputation left 5th toe at [facility] Assessment:   1. Gangrene of toe of left foot ([place])   2. S/P amputation of lesser toe, left ([place])    Plan:  Patient was evaluated and treated and all questions answered.  S/p foot surgery left -Progressing as expected post-operatively. -XR: status post amputation -WB Status: WBAT in sx shoe -Sutures: leave for 2 more weeks. -Medications: no refills required -Foot redressed.  Return in about 2 weeks (around 01/20/2021) for suture removal .

## 2021-01-07 ENCOUNTER — Encounter: Payer: Medicare Other | Admitting: Podiatry

## 2021-01-07 ENCOUNTER — Encounter: Payer: Self-pay | Admitting: Podiatry

## 2021-01-13 ENCOUNTER — Encounter: Payer: Medicare Other | Admitting: Podiatry

## 2021-01-14 ENCOUNTER — Encounter: Payer: Medicare Other | Admitting: Podiatry

## 2021-01-15 ENCOUNTER — Telehealth (INDEPENDENT_AMBULATORY_CARE_PROVIDER_SITE_OTHER): Payer: Self-pay | Admitting: Nurse Practitioner

## 2021-01-15 ENCOUNTER — Other Ambulatory Visit: Payer: Self-pay

## 2021-01-15 ENCOUNTER — Ambulatory Visit (INDEPENDENT_AMBULATORY_CARE_PROVIDER_SITE_OTHER): Payer: Medicare Other | Admitting: Podiatry

## 2021-01-15 ENCOUNTER — Encounter: Payer: Self-pay | Admitting: Podiatry

## 2021-01-15 ENCOUNTER — Ambulatory Visit (INDEPENDENT_AMBULATORY_CARE_PROVIDER_SITE_OTHER): Payer: Medicare Other

## 2021-01-15 DIAGNOSIS — I96 Gangrene, not elsewhere classified: Secondary | ICD-10-CM | POA: Diagnosis not present

## 2021-01-15 DIAGNOSIS — I739 Peripheral vascular disease, unspecified: Secondary | ICD-10-CM | POA: Diagnosis not present

## 2021-01-15 DIAGNOSIS — I70229 Atherosclerosis of native arteries of extremities with rest pain, unspecified extremity: Secondary | ICD-10-CM

## 2021-01-15 NOTE — Progress Notes (Signed)
   HPI: 35 y.o. female presenting today for a new concern regarding discoloration and darkening to the right fifth toe.  Patient has history of left fifth toe amputation.  She presents today wearing a postsurgical shoe to her left foot with dressings intact.  DOS: 01/01/2021 by Dr. Sherryle Lis.  Patient states that she began to be concerned because she noticed blackness with discoloration to the right fifth toe approximately 2 days ago.  No associated pain however she is very concerned due to given history of prior amputations and critical limb ischemia.  Past Medical History:  Diagnosis Date  . Anemia of chronic disease   . Asthma   . Calciphylaxis 2020  . CHF (congestive heart failure) ([place])   . CKD (chronic kidney disease), stage III ([place])   . Diabetes ([place])    type 2  . Hypertension   . Morbid obesity ([place])          Physical Exam: General: The patient is alert and oriented x3 in no acute distress.  Dermatology: Skin is cool to touch right lower extremity. Negative for open lesions or macerations.  Vascular: Diminished pedal pulses bilaterally. No edema or erythema noted. Capillary refill delayed.  Ischemia with discoloration noted to the right fifth toe as well as the remaining digits as well.  Skin is cool to touch  Neurological: Epicritic and protective threshold grossly intact bilaterally.   Musculoskeletal Exam: No pedal deformities noted.  Dressings left intact to the left foot  Assessment: 1.  Critical limb ischemia/PAD RLE 2.  H/0 left fifth toe amputation.  DOS: 01/01/2021   Plan of Care:  1. Patient evaluated.  2.  Patient is already established at [facility] with Dr. [name].  Recommend that she contact their office for acute changes to the right forefoot.  Patient may need to be reevaluated for right lower extremity ischemia 3.  Dressings to the left foot left intact today 4.  Return to clinic on next scheduled appointment with Dr. Sherryle Lis on  01/20/2021      Edrick Kins, DPM [facility]  Dr. Edrick Kins, DPM    [address], [place] [address]                Office [phone]  Fax [phone]

## 2021-01-15 NOTE — Patient Instructions (Signed)
 Raynaud Phenomenon  Raynaud phenomenon is a condition that affects the blood vessels (arteries) that carry blood to your fingers and toes. The arteries that supply blood to your ears, lips, nipples, or the tip of your nose might also be affected. Raynaud phenomenon causes the arteries to become narrow temporarily (spasm). As a result, the flow of blood to the affected areas is temporarily decreased. This usually occurs in response to cold temperatures or stress. During an attack, the skin in the affected areas turns white, then blue, and finally red. You may also feel tingling or numbness in those areas. Attacks usually last for only a brief period, and then the blood flow to the area returns to normal. In most cases, Raynaud phenomenon does not cause serious health problems. What are the causes? In many cases, the cause of this condition is not known. The condition may occur on its own (primary Raynaud phenomenon) or may be associated with other diseases or factors (secondary Raynaud phenomenon). Possible causes may include:  Diseases or medical conditions that damage the arteries.  Injuries and repetitive actions that hurt the hands or feet.  Being exposed to certain chemicals.  Taking medicines that narrow the arteries.  Other medical conditions, such as lupus, scleroderma, rheumatoid arthritis, thyroid problems, blood disorders, Sjogren syndrome, or atherosclerosis. What increases the risk? The following factors may make you more likely to develop this condition:  Being 20-40 years old.  Being female.  Having a family history of Raynaud phenomenon.  Living in a cold climate.  Smoking. What are the signs or symptoms? Symptoms of this condition usually occur when you are exposed to cold temperatures or when you have emotional stress. The symptoms may last for a few minutes or up to several hours. They usually affect your fingers but may also affect your toes, nipples, lips, ears,  or the tip of your nose. Symptoms may include:  Changes in skin color. The skin in the affected areas will turn pale or white. The skin may then change from white to bluish to red as normal blood flow returns to the area.  Numbness, tingling, or pain in the affected areas. In severe cases, symptoms may include:  Skin sores.  Tissues decaying and dying (gangrene). How is this diagnosed? This condition may be diagnosed based on:  Your symptoms and medical history.  A physical exam. During the exam, you may be asked to put your hands in cold water to check for a reaction to cold temperature.  Tests, such as: ? Blood tests to check for other diseases or conditions. ? A test to check the movement of blood through your arteries and veins (vascular ultrasound). ? A test in which the skin at the base of your fingernail is examined under a microscope (nailfold capillaroscopy). How is this treated? Treatment for this condition often involves making lifestyle changes and taking steps to control your exposure to cold temperatures. For more severe cases, medicine (calcium channel blockers) may be used to improve blood flow. Surgery is sometimes done to block the nerves that control the affected arteries, but this is rare. Follow these instructions at home: Avoiding cold temperatures Take these steps to avoid exposure to cold:  If possible, stay indoors during cold weather.  When you go outside during cold weather, dress in layers and wear mittens, a hat, a scarf, and warm footwear.  Wear mittens or gloves when handling ice or frozen food.  Use holders for glasses or cans containing cold drinks.    Let warm water run for a while before taking a shower or bath.  Warm up the car before driving in cold weather. Lifestyle  If possible, avoid stressful and emotional situations. Try to find ways to manage your stress, such as: ? Exercise. ? Yoga. ? Meditation. ? Biofeedback.  Do not use any  products that contain nicotine or tobacco, such as cigarettes and e-cigarettes. If you need help quitting, ask your health care provider.  Avoid secondhand smoke.  Limit your use of caffeine. ? Switch to decaffeinated coffee, tea, and soda. ? Avoid chocolate.  Avoid vibrating tools and machinery. General instructions  Protect your hands and feet from injuries, cuts, or bruises.  Avoid wearing tight rings or wristbands.  Wear loose fitting socks and comfortable, roomy shoes.  Take over-the-counter and prescription medicines only as told by your health care provider. Contact a health care provider if:  Your discomfort becomes worse despite lifestyle changes.  You develop sores on your fingers or toes that do not heal.  Your fingers or toes turn black.  You have breaks in the skin on your fingers or toes.  You have a fever.  You have pain or swelling in your joints.  You have a rash.  Your symptoms occur on only one side of your body. Summary  Raynaud phenomenon is a condition that affects the arteries that carry blood to your fingers, toes, ears, lips, nipples, or the tip of your nose.  In many cases, the cause of this condition is not known.  Symptoms of this condition include changes in skin color, and numbness and tingling of the affected area.  Treatment for this condition includes lifestyle changes, reducing exposure to cold temperatures, and using medicines for severe cases of the condition.  Contact your health care provider if your condition worsens despite treatment. This information is not intended to replace advice given to you by your health care provider. Make sure you discuss any questions you have with your health care provider. Document Revised: 02/13/2020 Document Reviewed: 02/13/2020 Elsevier Patient Education  2021 Elsevier Inc.  

## 2021-01-15 NOTE — Telephone Encounter (Signed)
Bring her in with [name]

## 2021-01-18 ENCOUNTER — Telehealth (INDEPENDENT_AMBULATORY_CARE_PROVIDER_SITE_OTHER): Payer: Self-pay

## 2021-01-18 NOTE — Telephone Encounter (Signed)
Spoke with the patient and she is scheduled with Dr .[name] for a RLE angio on 01/25/21 with a 6:45 am arrival time to the [facility]. Covid testing on 01/21/21 between 8-2 pm at the [facility]. Pre-procedure instructions were discussed and will be mailed.

## 2021-01-19 ENCOUNTER — Telehealth (INDEPENDENT_AMBULATORY_CARE_PROVIDER_SITE_OTHER): Payer: Self-pay

## 2021-01-19 NOTE — Telephone Encounter (Signed)
The Pt's mother called and wanted to make make [place] aware that the pt has been dizzy and very weak and having trouble walking since her peripheral vascular cath on 2/16 when she was started on the medication Plavix .Per Dr. [name] the pt should keep taking her Plavix due to her upcoming procedure and they will discuss changing the pt's blood thinner after that . I called the pt's mother and made her aware.

## 2021-01-20 ENCOUNTER — Encounter: Payer: Self-pay | Admitting: Podiatry

## 2021-01-20 ENCOUNTER — Ambulatory Visit (INDEPENDENT_AMBULATORY_CARE_PROVIDER_SITE_OTHER): Payer: Medicare Other | Admitting: Podiatry

## 2021-01-20 ENCOUNTER — Other Ambulatory Visit: Payer: Self-pay

## 2021-01-20 DIAGNOSIS — I70229 Atherosclerosis of native arteries of extremities with rest pain, unspecified extremity: Secondary | ICD-10-CM | POA: Diagnosis not present

## 2021-01-20 DIAGNOSIS — I739 Peripheral vascular disease, unspecified: Secondary | ICD-10-CM

## 2021-01-20 DIAGNOSIS — I96 Gangrene, not elsewhere classified: Secondary | ICD-10-CM

## 2021-01-20 DIAGNOSIS — Z89422 Acquired absence of other left toe(s): Secondary | ICD-10-CM

## 2021-01-20 NOTE — Telephone Encounter (Signed)
complete

## 2021-01-20 NOTE — Progress Notes (Signed)
  Subjective:  Patient ID: [name], female    DOB: 08-16-1986,  MRN: [id number]  Chief Complaint  Patient presents with  . Routine Post Op    POV #2 DOS 01/01/2021 LT 5TH TOE AMPUTATION    DOS: 01/01/21 Procedure: amputation L 5th toe  35 y.o. female returns for post-op check.  Scheduled for angiography Monday with Dr. [name] for the right side, she saw Dr. Amalia Hailey last week  Review of Systems: Negative except as noted in the HPI. Denies N/V/F/Ch.   Objective:   There were no vitals filed for this visit. There is no height or weight on file to calculate BMI. Constitutional Well developed. Well nourished.  Vascular  left foot is warm well perfused.  The right foot is cool to touch  Neurologic Normal speech. Oriented to person, place, and time. Epicritic sensation to light touch grossly decreased bilaterally.  Dermatologic Skin healing well without signs of infection. Skin edges well coapted without signs of infection.  Right fifth toe has hemorrhagic bulla, there is mottled erythema of the right foot distally in the toes  Orthopedic: Tenderness to palpation noted about the surgical site.   Radiographs: s/p amputation left 5th toe at [facility] Assessment:   1. Peripheral arterial disease ([place])   2. Critical lower limb ischemia ([facility])   3. Gangrene of toe of left foot ([place])   4. S/P amputation of lesser toe, left ([place])    Plan:  Patient was evaluated and treated and all questions answered.  S/p foot surgery left -Progressing as expected post-operatively. -XR: status post amputation -WB Status: WBAT in sx shoe -Sutures: We will remove in 1 more week -Medications: no refills required -Foot redressed.  Peripheral arterial disease and critical ischemia on the right -Scheduled for angio on Monday -Small hemorrhagic bulla today left intact, advised to continue to monitor apply Betadine if it begins to become an open ulcer -Would like to reevaluate her in 1 week to see  if the right side will have any tissue loss  No follow-ups on file.

## 2021-01-21 ENCOUNTER — Other Ambulatory Visit
Admission: RE | Admit: 2021-01-21 | Discharge: 2021-01-21 | Disposition: A | Discharge status: Home / Self Care | Payer: Medicare Other | Source: Ambulatory Visit | Attending: Vascular Surgery | Admitting: Vascular Surgery

## 2021-01-21 DIAGNOSIS — Z01812 Encounter for preprocedural laboratory examination: Secondary | ICD-10-CM | POA: Diagnosis present

## 2021-01-21 DIAGNOSIS — Z20822 Contact with and (suspected) exposure to covid-19: Secondary | ICD-10-CM | POA: Insufficient documentation

## 2021-01-22 LAB — SARS CORONAVIRUS 2 (TAT 6-24 HRS): SARS Coronavirus 2: NEGATIVE

## 2021-01-25 ENCOUNTER — Ambulatory Visit
Admission: RE | Admit: 2021-01-25 | Discharge: 2021-01-25 | Disposition: A | Discharge status: Home / Self Care | Payer: Medicare Other | Attending: Vascular Surgery | Admitting: Vascular Surgery

## 2021-01-25 ENCOUNTER — Encounter: Payer: Self-pay | Admitting: Vascular Surgery

## 2021-01-25 ENCOUNTER — Encounter
Admission: RE | Disposition: A | Discharge status: Home / Self Care | Payer: Self-pay | Source: Home / Self Care | Attending: Vascular Surgery

## 2021-01-25 ENCOUNTER — Other Ambulatory Visit: Payer: Self-pay

## 2021-01-25 ENCOUNTER — Other Ambulatory Visit (INDEPENDENT_AMBULATORY_CARE_PROVIDER_SITE_OTHER): Payer: Self-pay | Admitting: Nurse Practitioner

## 2021-01-25 DIAGNOSIS — Z992 Dependence on renal dialysis: Secondary | ICD-10-CM | POA: Insufficient documentation

## 2021-01-25 DIAGNOSIS — E1122 Type 2 diabetes mellitus with diabetic chronic kidney disease: Secondary | ICD-10-CM | POA: Diagnosis not present

## 2021-01-25 DIAGNOSIS — Z8249 Family history of ischemic heart disease and other diseases of the circulatory system: Secondary | ICD-10-CM | POA: Insufficient documentation

## 2021-01-25 DIAGNOSIS — N186 End stage renal disease: Secondary | ICD-10-CM | POA: Insufficient documentation

## 2021-01-25 DIAGNOSIS — L97529 Non-pressure chronic ulcer of other part of left foot with unspecified severity: Secondary | ICD-10-CM | POA: Insufficient documentation

## 2021-01-25 DIAGNOSIS — I70245 Atherosclerosis of native arteries of left leg with ulceration of other part of foot: Secondary | ICD-10-CM | POA: Diagnosis not present

## 2021-01-25 DIAGNOSIS — E1151 Type 2 diabetes mellitus with diabetic peripheral angiopathy without gangrene: Secondary | ICD-10-CM | POA: Diagnosis present

## 2021-01-25 DIAGNOSIS — Z6841 Body Mass Index (BMI) 40.0 and over, adult: Secondary | ICD-10-CM | POA: Diagnosis not present

## 2021-01-25 DIAGNOSIS — I509 Heart failure, unspecified: Secondary | ICD-10-CM | POA: Diagnosis not present

## 2021-01-25 DIAGNOSIS — I132 Hypertensive heart and chronic kidney disease with heart failure and with stage 5 chronic kidney disease, or end stage renal disease: Secondary | ICD-10-CM | POA: Insufficient documentation

## 2021-01-25 DIAGNOSIS — L97909 Non-pressure chronic ulcer of unspecified part of unspecified lower leg with unspecified severity: Secondary | ICD-10-CM

## 2021-01-25 DIAGNOSIS — Z87891 Personal history of nicotine dependence: Secondary | ICD-10-CM | POA: Insufficient documentation

## 2021-01-25 DIAGNOSIS — E11621 Type 2 diabetes mellitus with foot ulcer: Secondary | ICD-10-CM | POA: Insufficient documentation

## 2021-01-25 DIAGNOSIS — I70235 Atherosclerosis of native arteries of right leg with ulceration of other part of foot: Secondary | ICD-10-CM | POA: Diagnosis not present

## 2021-01-25 HISTORY — PX: LOWER EXTREMITY ANGIOGRAPHY: CATH118251

## 2021-01-25 LAB — GLUCOSE, CAPILLARY: Glucose-Capillary: 114 mg/dL — ABNORMAL HIGH (ref 70–99)

## 2021-01-25 LAB — POTASSIUM (ARMC VASCULAR LAB ONLY): Potassium (ARMC vascular lab): 3.6 (ref 3.5–5.1)

## 2021-01-25 SURGERY — LOWER EXTREMITY ANGIOGRAPHY
Anesthesia: Moderate Sedation | Site: Leg Lower | Laterality: Right

## 2021-01-25 MED ORDER — HEPARIN SODIUM (PORCINE) 1000 UNIT/ML IJ SOLN
INTRAMUSCULAR | Status: AC
  Filled 2021-01-25: qty 1

## 2021-01-25 MED ORDER — MIDAZOLAM HCL 5 MG/5ML IJ SOLN
INTRAMUSCULAR | Status: AC
  Filled 2021-01-25: qty 5

## 2021-01-25 MED ORDER — CEFAZOLIN SODIUM-DEXTROSE 1-4 GM/50ML-% IV SOLN: 1.0000 g | Freq: Once | INTRAVENOUS | Status: DC

## 2021-01-25 MED ORDER — FENTANYL CITRATE (PF) 100 MCG/2ML IJ SOLN
INTRAMUSCULAR | Status: AC
  Filled 2021-01-25: qty 2

## 2021-01-25 MED ORDER — HEPARIN SODIUM (PORCINE) 1000 UNIT/ML IJ SOLN
INTRAMUSCULAR | Status: DC | PRN
  Administered 2021-01-25: 5000 [IU] via INTRAVENOUS

## 2021-01-25 MED ORDER — HYDROMORPHONE HCL 1 MG/ML IJ SOLN: 1.0000 mg | Freq: Once | INTRAMUSCULAR | Status: DC | PRN

## 2021-01-25 MED ORDER — DIPHENHYDRAMINE HCL 50 MG/ML IJ SOLN: 50.0000 mg | Freq: Once | INTRAMUSCULAR | Status: DC | PRN

## 2021-01-25 MED ORDER — FENTANYL CITRATE (PF) 100 MCG/2ML IJ SOLN
INTRAMUSCULAR | Status: DC | PRN
  Administered 2021-01-25 (×3): 25 ug via INTRAVENOUS
  Administered 2021-01-25: 50 ug via INTRAVENOUS
  Administered 2021-01-25: 25 ug via INTRAVENOUS

## 2021-01-25 MED ORDER — CEFAZOLIN SODIUM-DEXTROSE 1-4 GM/50ML-% IV SOLN
INTRAVENOUS | Status: AC
  Filled 2021-01-25: qty 50

## 2021-01-25 MED ORDER — MIDAZOLAM HCL 2 MG/ML PO SYRP: 8.0000 mg | ORAL_SOLUTION | Freq: Once | ORAL | Status: DC | PRN

## 2021-01-25 MED ORDER — ONDANSETRON HCL 4 MG/2ML IJ SOLN: 4.0000 mg | Freq: Four times a day (QID) | INTRAMUSCULAR | Status: DC | PRN

## 2021-01-25 MED ORDER — FAMOTIDINE 20 MG PO TABS: 40.0000 mg | ORAL_TABLET | Freq: Once | ORAL | Status: DC | PRN

## 2021-01-25 MED ORDER — SODIUM CHLORIDE 0.9 % IV SOLN: INTRAVENOUS | Status: DC

## 2021-01-25 MED ORDER — METHYLPREDNISOLONE SODIUM SUCC 125 MG IJ SOLR: 125.0000 mg | Freq: Once | INTRAMUSCULAR | Status: DC | PRN

## 2021-01-25 MED ORDER — MIDAZOLAM HCL 2 MG/2ML IJ SOLN
INTRAMUSCULAR | Status: AC
  Filled 2021-01-25: qty 2

## 2021-01-25 MED ORDER — MIDAZOLAM HCL 2 MG/2ML IJ SOLN
INTRAMUSCULAR | Status: DC | PRN
  Administered 2021-01-25 (×2): 1 mg via INTRAVENOUS
  Administered 2021-01-25: 2 mg via INTRAVENOUS
  Administered 2021-01-25 (×2): 1 mg via INTRAVENOUS

## 2021-01-25 SURGICAL SUPPLY — 16 items
BALLN LUTONIX 018 5X220X130 (BALLOONS) ×2
BALLN ULTRVRSE 2.5X300X150 (BALLOONS) ×2
BALLN ULTRVRSE 2X220X150 (BALLOONS) ×2
BALLOON LUTONIX 018 5X220X130 (BALLOONS) ×1 IMPLANT
BALLOON ULTRVRSE 2.5X300X150 (BALLOONS) ×1 IMPLANT
BALLOON ULTRVRSE 2X220X150 (BALLOONS) ×1 IMPLANT
CATH BEACON 5 .038 100 VERT TP (CATHETERS) ×2 IMPLANT
CATH TEMPO 5F RIM 65CM (CATHETERS) ×2 IMPLANT
DEVICE STARCLOSE SE CLOSURE (Vascular Products) ×2 IMPLANT
GLIDEWIRE ADV .035X260CM (WIRE) ×2 IMPLANT
KIT ENCORE 26 ADVANTAGE (KITS) ×2 IMPLANT
PACK ANGIOGRAPHY (CUSTOM PROCEDURE TRAY) ×2 IMPLANT
SHEATH BRITE TIP 5FRX11 (SHEATH) ×2 IMPLANT
SHEATH RAABE 6FRX70 (SHEATH) ×2 IMPLANT
WIRE G V18X300CM (WIRE) ×2 IMPLANT
WIRE GUIDERIGHT .035X150 (WIRE) ×2 IMPLANT

## 2021-01-25 NOTE — Op Note (Signed)
[facility]  Percutaneous Study/Intervention Procedural Note   Date of Surgery: 01/25/2021  Surgeon(s):Fleur Audino    Assistants:none  Pre-operative Diagnosis: PAD with ulceration bilateral lower extremities  Post-operative diagnosis:  Same  Procedure(s) Performed:             1.  Ultrasound guidance for vascular access left femoral artery             2.  Catheter placement into right common femoral artery from left femoral approach             3.  Selective right lower extremity angiogram             4.  Percutaneous transluminal angioplasty of right posterior tibial artery with 2 mm diameter angioplasty balloon distally and 2-1/2 mm diameter angioplasty balloon from the mid to distal segment approximately             5.   Percutaneous transluminal angioplasty of the right peroneal artery with 2-1/2 mm diameter angioplasty balloon  6.  Percutaneous transluminal angioplasty of right popliteal artery with 5 mm diameter Lutonix drug-coated angioplasty balloon             7.  StarClose closure device left femoral artery  EBL: 10 cc  Contrast: 45 cc  Fluoro Time: 8 minutes  Moderate Conscious Sedation Time: approximately 58 minutes using 6 mg of Versed and 150 mcg of Fentanyl              Indications:  Patient is a 35 y.o.female with nonhealing ulcerations on both lower extremities in the feet.  She has already undergone left lower extremity revascularization earlier this year.. The patient has noninvasive study showing reduced perfusion in both lower extremities prior to her left leg intervention. The patient is brought in for angiography for further evaluation and potential treatment.  Due to the limb threatening nature of the situation, angiogram was performed for attempted limb salvage. The patient is aware that if the procedure fails, amputation would be expected.  The patient also understands that even with successful revascularization, amputation may still be  required due to the severity of the situation.  Risks and benefits are discussed and informed consent is obtained.   Procedure:  The patient was identified and appropriate procedural time out was performed.  The patient was then placed supine on the table and prepped and draped in the usual sterile fashion. Moderate conscious sedation was administered during a face to face encounter with the patient throughout the procedure with my supervision of the RN administering medicines and monitoring the patient's vital signs, pulse oximetry, telemetry and mental status throughout from the start of the procedure until the patient was taken to the recovery room. Ultrasound was used to evaluate the left common femoral artery.  It was patent .  A digital ultrasound image was acquired.  A Seldinger needle was used to access the left common femoral artery under direct ultrasound guidance and a permanent image was performed.  A 0.035 J wire was advanced without resistance and a 5Fr sheath was placed.  I then crossed the aortic bifurcation and advanced to the right femoral head with an advantage wire and a rim catheter. Selective right lower extremity angiogram was then performed. This demonstrated a fairly normal common femoral artery, profunda femoris artery, and superficial femoral artery.  In the popliteal artery there were tandem stenoses.  The one above the knee was in the 60% range and then the below-knee popliteal artery  had about a 70 to 75% stenosis.  There was a typical tibial trifurcation.  The anterior tibial artery is chronically occluded without distal reconstitution.  The peroneal artery had about an 8 to 10 cm occlusion in the midsegment.  The posterior tibial artery was the largest runoff vessel and was fairly normal to the proximal to mid posterior tibial artery, but then had multiple areas of diffuse disease and greater than 70% stenosis including a short segment occlusion in the mid to distal segment and  short segment occlusion at the ankle.. It was felt that it was in the patient's best interest to proceed with intervention after these images to avoid a second procedure and a larger amount of contrast and fluoroscopy based off of the findings from the initial angiogram. The patient was systemically heparinized and a 6 French 70 cm sheath was then placed over the Terumo Advantage wire. I then used a Kumpe catheter and the advantage wire to navigate through the popliteal stenosis without difficulty and get down into the posterior tibial artery where I then exchanged for a V 18 wire.  Using the V 18 wire, across the multiple areas of stenosis occlusion of the posterior tibial artery and got all the way into the fairly distal foot in the metatarsal range.  I then advanced a 2 mm diameter by 22 cm length angioplasty balloon into the foot just past the ankle and inflated this to 14 atm for 1 minute.  I then upsized to a 2.5 mm diameter by 30 cm length angioplasty balloon to treat the remainder of the posterior tibial artery from the mid to distal posterior tibial artery up to its origin and inflated this to 14 atm for 1 minute as well.  Completion imaging showed nice flow through the posterior tibial artery into the foot with no greater than 20% residual stenosis identified.  I then turned my attention to the popliteal artery and treated the tandem lesions with a 5 mm diameter by 22 cm length Lutonix drug-coated angioplasty balloon inflated to 12 atm for 1 minute.  Completion imaging showed marked improvement with less than 10% residual stenosis in the popliteal artery after this intervention.  I then turned my attention to the peroneal artery.  The peroneal artery was treated with a 2.5 millimeters diameter by 30 cm length angioplasty balloon inflated to 14 atm for 1 minute.  Completion imaging showed marked improvement with about a 20% residual stenosis. I elected to terminate the procedure. The sheath was removed and  StarClose closure device was deployed in the left femoral artery with excellent hemostatic result. The patient was taken to the recovery room in stable condition having tolerated the procedure well.  Findings:                      Right Lower Extremity:  Fairly normal common femoral artery, profunda femoris artery, and superficial femoral artery.  In the popliteal artery there were tandem stenoses.  The one above the knee was in the 60% range and then the below-knee popliteal artery had about a 70 to 75% stenosis.  There was a typical tibial trifurcation.  The anterior tibial artery is chronically occluded without distal reconstitution.  The peroneal artery had about an 8 to 10 cm occlusion in the midsegment.  The posterior tibial artery was the largest runoff vessel and was fairly normal to the proximal to mid posterior tibial artery, but then had multiple areas of diffuse disease and greater than  70% stenosis including a short segment occlusion in the mid to distal segment and short segment occlusion at the ankle.   Disposition: Patient was taken to the recovery room in stable condition having tolerated the procedure well.  Complications: None  [name] 01/25/2021 9:27 AM   This note was created with Dragon Medical transcription system. Any errors in dictation are purely unintentional.

## 2021-01-25 NOTE — H&P (Signed)
[facility] Admission History & Physical  MRN : [id number]  [name] is a 35 y.o. (1986-08-06) female who presents with chief complaint of No chief complaint on file. Marland Kitchen  History of Present Illness: Patient presents for right lower extremity angiogram for nonhealing ulceration with PAD.  Has already undergone left lower extremity revascularization.  Had significant tibial disease and noninvasive studies previously showed reduced flow bilaterally.  She is a dialysis patient with multiple other medical comorbidities as described below.  No new complaints today.  No fever or chills  Current Facility-Administered Medications  Medication Dose Route Frequency Provider Last Rate Last Admin  . 0.9 %  sodium chloride infusion   Intravenous Continuous Kris Hartmann, NP 10 mL/hr at 01/25/21 0745 New Bag at 01/25/21 0745  . ceFAZolin (ANCEF) 1-4 GM/50ML-% IVPB           . ceFAZolin (ANCEF) IVPB 1 g/50 mL premix  1 g Intravenous Once Kris Hartmann, NP      . diphenhydrAMINE (BENADRYL) injection 50 mg  50 mg Intravenous Once PRN Kris Hartmann, NP      . famotidine (PEPCID) tablet 40 mg  40 mg Oral Once PRN Kris Hartmann, NP      . HYDROmorphone (DILAUDID) injection 1 mg  1 mg Intravenous Once PRN Eulogio Ditch E, NP      . methylPREDNISolone sodium succinate (SOLU-MEDROL) 125 mg/2 mL injection 125 mg  125 mg Intravenous Once PRN Eulogio Ditch E, NP      . midazolam (VERSED) 2 MG/ML syrup 8 mg  8 mg Oral Once PRN Kris Hartmann, NP      . ondansetron (ZOFRAN) injection 4 mg  4 mg Intravenous Q6H PRN Kris Hartmann, NP        Past Medical History:  Diagnosis Date  . Anemia of chronic disease   . Asthma   . Calciphylaxis 2020  . CHF (congestive heart failure) ([place])   . CKD (chronic kidney disease), stage III ([place])   . Diabetes ([place])    type 2  . Hypertension   . Morbid obesity ([place])     Past Surgical History:  Procedure Laterality Date  . A/V  FISTULAGRAM Left 10/29/2020   Procedure: A/V FISTULAGRAM;  Surgeon: Algernon Huxley, MD;  Location: [facility];  Service: Cardiovascular;  Laterality: Left;  . APPLICATION OF WOUND VAC Bilateral 09/15/2019   Procedure: APPLICATION OF WOUND VAC;  Surgeon: Benjamine Sprague, DO;  Location: [facility];  Service: General;  Laterality: Bilateral;  . AV FISTULA PLACEMENT Left 07/09/2020   Procedure: ARTERIOVENOUS (AV) FISTULA CREATION ( BRACHIAL CEPHALIC);  Surgeon: Algernon Huxley, MD;  Location: [facility];  Service: Vascular;  Laterality: Left;  . DIALYSIS/PERMA CATHETER INSERTION N/A 02/28/2019   Procedure: DIALYSIS/PERMA CATHETER INSERTION;  Surgeon: Algernon Huxley, MD;  Location: [facility];  Service: Cardiovascular;  Laterality: N/A;  . DIALYSIS/PERMA CATHETER INSERTION N/A 09/17/2019   Procedure: DIALYSIS/PERMA CATHETER EXCHANGE;  Surgeon: Katha Cabal, MD;  Location: [facility];  Service: Cardiovascular;  Laterality: N/A;  . DIALYSIS/PERMA CATHETER REMOVAL N/A 03/18/2020   Procedure: DIALYSIS/PERMA CATHETER REMOVAL;  Surgeon: Algernon Huxley, MD;  Location: [facility];  Service: Cardiovascular;  Laterality: N/A;  . LOWER EXTREMITY ANGIOGRAPHY Left 12/02/2020   Procedure: LOWER EXTREMITY ANGIOGRAPHY;  Surgeon: Algernon Huxley, MD;  Location: [facility];  Service: Cardiovascular;  Laterality: Left;  . pd cath  05/21/2019  . TONSILLECTOMY    . WOUND DEBRIDEMENT Bilateral 09/15/2019   Procedure: DEBRIDEMENT WOUND;  Surgeon: Benjamine Sprague, DO;  Location: [facility];  Service: General;  Laterality: Bilateral;     Social History   Tobacco Use  . Smoking status: Former Smoker    Types: Cigars  . Smokeless tobacco: Never Used  Vaping Use  . Vaping Use: Never used  Substance Use Topics  . Alcohol use: No    Alcohol/week: 0.0 standard drinks  . Drug use: No     Family History  Problem Relation Age of Onset  . Hypertension Mother   . Multiple sclerosis Mother   .  CAD Mother   . Cancer Maternal Grandmother        Ovarian   . Cancer Maternal Grandfather        Prostate   . Kidney disease Father     Allergies  Allergen Reactions  . Tomato (Diagnostic) Itching     REVIEW OF SYSTEMS (Negative unless checked)  Constitutional: '[]'$ Weight loss  '[]'$ Fever  '[]'$ Chills Cardiac: '[]'$ Chest pain   '[]'$ Chest pressure   '[]'$ Palpitations   '[]'$ Shortness of breath when laying flat   '[]'$ Shortness of breath at rest   '[]'$ Shortness of breath with exertion. Vascular:  '[]'$ Pain in legs with walking   '[]'$ Pain in legs at rest   '[]'$ Pain in legs when laying flat   '[]'$ Claudication   '[]'$ Pain in feet when walking  '[]'$ Pain in feet at rest  '[]'$ Pain in feet when laying flat   '[]'$ History of DVT   '[]'$ Phlebitis   '[]'$ Swelling in legs   '[]'$ Varicose veins   '[x]'$ Non-healing ulcers Pulmonary:   '[]'$ Uses home oxygen   '[]'$ Productive cough   '[]'$ Hemoptysis   '[]'$ Wheeze  '[]'$ COPD   '[]'$ Asthma Neurologic:  '[]'$ Dizziness  '[]'$ Blackouts   '[]'$ Seizures   '[]'$ History of stroke   '[]'$ History of TIA  '[]'$ Aphasia   '[]'$ Temporary blindness   '[]'$ Dysphagia   '[]'$ Weakness or numbness in arms   '[]'$ Weakness or numbness in legs Musculoskeletal:  '[x]'$ Arthritis   '[]'$ Joint swelling   '[x]'$ Joint pain   '[]'$ Low back pain Hematologic:  '[]'$ Easy bruising  '[]'$ Easy bleeding   '[]'$ Hypercoagulable state   '[x]'$ Anemic  '[]'$ Hepatitis Gastrointestinal:  '[]'$ Blood in stool   '[]'$ Vomiting blood  '[]'$ Gastroesophageal reflux/heartburn   '[]'$ Difficulty swallowing. Genitourinary:  '[x]'$ Chronic kidney disease   '[]'$ Difficult urination  '[]'$ Frequent urination  '[]'$ Burning with urination   '[]'$ Blood in urine Skin:  '[]'$ Rashes   '[x]'$ Ulcers   '[x]'$ Wounds Psychological:  '[]'$ History of anxiety   '[]'$  History of major depression.  Physical Examination  Vitals:   01/25/21 0735  BP: 139/80  Pulse: 90  Resp: 20  Temp: 98.3 F (36.8 C)  TempSrc: Oral  SpO2: 93%  Weight: 135.2 kg  Height: '5\' 6"'$  (1.676 m)   Body mass index is 48.1 kg/m. Gen: WD/WN, NAD. Morbidly obese Head: /AT, No temporalis wasting.   Ear/Nose/Throat: Hearing grossly intact, nares w/o erythema or drainage, oropharynx w/o Erythema/Exudate,  Eyes: Conjunctiva clear, sclera non-icteric Neck: Trachea midline.  No JVD.  Pulmonary:  Good air movement, respirations not labored, no use of accessory muscles.  Cardiac: RRR, normal S1, S2. Vascular: left arm AVF with thrill. Vessel Right Left  Radial Palpable Palpable                          PT  1+ Palpable 1+ Palpable  DP Trace Palpable 1+ Palpable   Gastrointestinal: soft, non-tender/non-distended. No guarding/reflex. PD catheter in place Musculoskeletal: M/S 5/5  throughout.  Extremities without ischemic changes.  No deformity or atrophy. Mild LE edema Neurologic: Sensation grossly intact in extremities.  Symmetrical.  Speech is fluent. Motor exam as listed above. Psychiatric: Judgment intact, Mood & affect appropriate for pt's clinical situation. Dermatologic: No rashes or ulcers noted.  No cellulitis or open wounds.      CBC Lab Results  Component Value Date   WBC 15.1 (H) 09/19/2020   HGB 11.4 (L) 09/19/2020   HCT 34.3 (L) 09/19/2020   MCV 94.5 09/19/2020   PLT 250 09/19/2020    BMET    Component Value Date/Time   NA 139 09/19/2020 1505   NA 135 (L) 12/31/2013 1020   K 3.2 (L) 09/19/2020 1505   K 3.9 12/31/2013 1020   CL 95 (L) 09/19/2020 1505   CL 105 12/31/2013 1020   CO2 23 09/19/2020 1505   CO2 26 12/31/2013 1020   GLUCOSE 121 (H) 09/19/2020 1505   GLUCOSE 243 (H) 12/31/2013 1020   BUN 58 (H) 09/19/2020 1505   BUN 19 (H) 12/31/2013 1020   CREATININE 12.28 (H) 09/19/2020 1505   CREATININE 2.12 (H) 12/31/2013 1020   CALCIUM 7.6 (L) 09/19/2020 1505   CALCIUM 8.5 12/31/2013 1020   GFRNONAA 4 (L) 09/19/2020 1505   GFRNONAA 31 (L) 12/31/2013 1020   GFRAA 5 (L) 06/18/2020 1217   GFRAA 36 (L) 12/31/2013 1020   CrCl cannot be calculated (Patient's most recent lab result is older than the maximum 21 days allowed.).  COAG Lab Results   Component Value Date   INR 1.0 06/18/2020   INR 1.1 07/02/2019   INR 1.16 02/12/2016    Radiology DG Foot Complete Left  Result Date: 01/06/2021 Please see detailed radiograph report in office note.  DG Foot Complete Right  Result Date: 01/15/2021 Please see detailed radiograph report in office note.    Assessment/Plan 1. 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  This represents a limb threatening ischemia and places the patient at the risk for limb loss.  Patient should undergo angiography of the right lower extremities with the hope for intervention for limb salvage.  She has already undergone left lower extremity revascularization a couple of months ago. The risks and benefits as well as the alternative therapies was discussed in detail with the patient.  All questions were answered.  Patient agrees to proceed with angiography.  The patient will follow up with me in the office after the procedure.     2. Primary hypertension Continue antihypertensive medications as already ordered, these medications have been reviewed and there are no changes at this time.   3. ESRD (end stage renal disease) ([place]) Noninvasive studies show that the patient does have a viable left brachiocephalic AV fistula.  The patient is currently maintained via peritoneal dialysis and so the fistula will not be necessary to use regularly.  However, if the patient needs hemodialysis her fistula is currently adequate for use.   [name], MD  01/25/2021 8:11 AM

## 2021-01-27 ENCOUNTER — Ambulatory Visit (INDEPENDENT_AMBULATORY_CARE_PROVIDER_SITE_OTHER): Payer: Medicare Other

## 2021-01-27 ENCOUNTER — Ambulatory Visit (INDEPENDENT_AMBULATORY_CARE_PROVIDER_SITE_OTHER): Payer: Medicare Other | Admitting: Podiatry

## 2021-01-27 ENCOUNTER — Other Ambulatory Visit: Payer: Self-pay | Admitting: Podiatry

## 2021-01-27 ENCOUNTER — Other Ambulatory Visit: Payer: Self-pay

## 2021-01-27 ENCOUNTER — Encounter: Payer: Self-pay | Admitting: Podiatry

## 2021-01-27 DIAGNOSIS — Z89422 Acquired absence of other left toe(s): Secondary | ICD-10-CM

## 2021-01-27 DIAGNOSIS — L02619 Cutaneous abscess of unspecified foot: Secondary | ICD-10-CM

## 2021-01-27 DIAGNOSIS — IMO0002 Reserved for concepts with insufficient information to code with codable children: Secondary | ICD-10-CM

## 2021-01-27 DIAGNOSIS — L03119 Cellulitis of unspecified part of limb: Secondary | ICD-10-CM

## 2021-01-27 DIAGNOSIS — I70229 Atherosclerosis of native arteries of extremities with rest pain, unspecified extremity: Secondary | ICD-10-CM

## 2021-01-27 DIAGNOSIS — I96 Gangrene, not elsewhere classified: Secondary | ICD-10-CM

## 2021-01-27 MED ORDER — DOXYCYCLINE HYCLATE 100 MG PO TABS
100.0000 mg | ORAL_TABLET | Freq: Two times a day (BID) | ORAL | 0 refills | Status: DC
Start: 2021-01-27 — End: 2021-02-10

## 2021-01-28 ENCOUNTER — Encounter: Payer: Medicare Other | Admitting: Podiatry

## 2021-02-02 ENCOUNTER — Encounter: Payer: Self-pay | Admitting: Podiatry

## 2021-02-02 LAB — WOUND CULTURE

## 2021-02-02 NOTE — Progress Notes (Signed)
  Subjective:  Patient ID: [name], female    DOB: 1985-11-03,  MRN: [id number]  Chief Complaint  Patient presents with  . Routine Post Op    POV #3 DOS 01/01/2021 LT 5TH TOE AMPUTATION    DOS: 01/01/21 Procedure: amputation L 5th toe  35 y.o. female returns for post-op check.  Had angiogram with Dr. [name].  Review of Systems: Negative except as noted in the HPI. Denies N/V/F/Ch.   Objective:   There were no vitals filed for this visit. There is no height or weight on file to calculate BMI. Constitutional Well developed. Well nourished.  Vascular  left foot is warm well perfused.  The right foot is cool to touch  Neurologic Normal speech. Oriented to person, place, and time. Epicritic sensation to light touch grossly decreased bilaterally.  Dermatologic  periphery of incision is well-healed, there is a small central dehiscence with a small amount (1 cc) of purulence  Right fifth toe has hemorrhagic bulla, there is mottled erythema of the right foot distally in the toes  Orthopedic: Tenderness to palpation noted about the surgical site.   Radiographs: s/p amputation left 5th toe at [facility] Assessment:   1. S/P amputation of lesser toe, left ([place])   2. Abscess or cellulitis of foot    Plan:  Patient was evaluated and treated and all questions answered.  S/p foot surgery left -Small amount of purulence expressed from wound.  Wound culture taken.  Rx for doxycycline sent to [facility] local wound care with daily dressing changes with Betadine  Peripheral arterial disease and critical ischemia on the right -So far no changes in skin although right lower extremity appears warm compared to previous, mottling is still present -We will continue to follow to see if this demarcates into gangrene of the toe  Return in about 1 week (around 02/03/2021) for wound care.

## 2021-02-03 ENCOUNTER — Other Ambulatory Visit: Payer: Self-pay

## 2021-02-03 ENCOUNTER — Encounter: Payer: Self-pay | Admitting: Podiatry

## 2021-02-03 ENCOUNTER — Ambulatory Visit (INDEPENDENT_AMBULATORY_CARE_PROVIDER_SITE_OTHER): Payer: Medicare Other | Admitting: Podiatry

## 2021-02-03 DIAGNOSIS — L97522 Non-pressure chronic ulcer of other part of left foot with fat layer exposed: Secondary | ICD-10-CM

## 2021-02-03 DIAGNOSIS — I739 Peripheral vascular disease, unspecified: Secondary | ICD-10-CM

## 2021-02-03 DIAGNOSIS — Z89422 Acquired absence of other left toe(s): Secondary | ICD-10-CM

## 2021-02-07 NOTE — Progress Notes (Signed)
  Subjective:  Patient ID: [name], female    DOB: 12/13/1985,  MRN: [id number]  Chief Complaint  Patient presents with  . Routine Post Op    DOS 01/01/2021 LT 5TH TOE AMPUTATION    DOS: 01/01/21 Procedure: amputation L 5th toe  35 y.o. female returns for post-op check.  The blister on the right foot popped and had some bloody drainage Review of Systems: Negative except as noted in the HPI. Denies N/V/F/Ch.   Objective:   There were no vitals filed for this visit. There is no height or weight on file to calculate BMI. Constitutional Well developed. Well nourished.  Vascular  left foot is warm well perfused.  The right foot is cool to touch  Neurologic Normal speech. Oriented to person, place, and time. Epicritic sensation to light touch grossly decreased bilaterally.  Dermatologic  periphery of incision is well-healed central area healing well, no purulence today.  Nonviable tissue, central area measures 1 cm x 0.2 cm x 0.3 cm  Right fifth toe hemorrhagic bulla has opened and skin is peeling, does not penetrate dermis  Orthopedic: Tenderness to palpation noted about the surgical site.   Radiographs: s/p amputation left 5th toe at [facility]  Wound culture with moderate growth of corynebacterium Assessment:   No diagnosis found. Plan:  Patient was evaluated and treated and all questions answered.  S/p foot surgery left -Debrided incision today of nonviable tissue.  Measurements above.  Culture results reviewed.  Complete antibiotics  Peripheral arterial disease and critical ischemia on the right -Directly hemorrhagic bulla and painted with Betadine hopefully this will recover, will have to continue to watch demarcation  Return in about 2 weeks (around 02/17/2021) for wound care.

## 2021-02-10 ENCOUNTER — Telehealth: Payer: Self-pay | Admitting: Podiatry

## 2021-02-10 MED ORDER — AMOXICILLIN-POT CLAVULANATE 875-125 MG PO TABS: ORAL_TABLET | ORAL | 0 refills | Status: DC

## 2021-02-11 ENCOUNTER — Ambulatory Visit (INDEPENDENT_AMBULATORY_CARE_PROVIDER_SITE_OTHER): Payer: Medicare Other | Admitting: Podiatry

## 2021-02-11 ENCOUNTER — Other Ambulatory Visit: Payer: Self-pay

## 2021-02-11 DIAGNOSIS — L97522 Non-pressure chronic ulcer of other part of left foot with fat layer exposed: Secondary | ICD-10-CM | POA: Diagnosis not present

## 2021-02-11 DIAGNOSIS — Z89422 Acquired absence of other left toe(s): Secondary | ICD-10-CM

## 2021-02-11 DIAGNOSIS — I739 Peripheral vascular disease, unspecified: Secondary | ICD-10-CM

## 2021-02-11 NOTE — Telephone Encounter (Signed)
Augmentin sent to pharmacy, urgent visit scheduled for 02/11/2021

## 2021-02-13 ENCOUNTER — Encounter: Payer: Self-pay | Admitting: Podiatry

## 2021-02-13 NOTE — Progress Notes (Signed)
  Subjective:  Patient ID: Charlie Pitter, female    DOB: 04/11/1986,  MRN: [id number]  No chief complaint on file.   DOS: 01/01/21 Procedure: amputation L 5th toe  35 y.o. female returns for post-op check.  There is been more drainage on the left foot and the right foot is starting to scab Review of Systems: Negative except as noted in the HPI. Denies N/V/F/Ch.   Objective:   There were no vitals filed for this visit. There is no height or weight on file to calculate BMI. Constitutional Well developed. Well nourished.  Vascular  left foot is warm well perfused.  The right foot is cool to touch  Neurologic Normal speech. Oriented to person, place, and time. Epicritic sensation to light touch grossly decreased bilaterally.  Dermatologic  periphery of incision is well-healed central area healing well, no purulence today.  Nonviable tissue, central area measures 1 cm x 0.2 cm x 0.3 cm  Right fifth toe hemorrhagic bulla has developed into a hard eschar  Orthopedic: Tenderness to palpation noted about the surgical site.   Radiographs: s/p amputation left 5th toe at [facility]  Wound culture with moderate growth of corynebacterium Assessment:   1. S/P amputation of lesser toe, left ([place])   2. Peripheral arterial disease ([place])   3. Ulcer of left foot with fat layer exposed ([place])    Plan:  Patient was evaluated and treated and all questions answered.  S/p foot surgery left -Continue local wound care with Prisma on the left foot and Betadine on the right foot.  We also watch the right foot to see if it demarcates, we discussed the risk of amputation if this happens  No follow-ups on file.

## 2021-02-17 ENCOUNTER — Other Ambulatory Visit: Payer: Self-pay

## 2021-02-17 ENCOUNTER — Ambulatory Visit (INDEPENDENT_AMBULATORY_CARE_PROVIDER_SITE_OTHER): Payer: Medicare Other | Admitting: Podiatry

## 2021-02-17 DIAGNOSIS — L97522 Non-pressure chronic ulcer of other part of left foot with fat layer exposed: Secondary | ICD-10-CM | POA: Diagnosis not present

## 2021-02-17 DIAGNOSIS — I739 Peripheral vascular disease, unspecified: Secondary | ICD-10-CM

## 2021-02-17 DIAGNOSIS — Z89422 Acquired absence of other left toe(s): Secondary | ICD-10-CM

## 2021-02-17 DIAGNOSIS — I96 Gangrene, not elsewhere classified: Secondary | ICD-10-CM

## 2021-02-18 ENCOUNTER — Telehealth: Payer: Self-pay | Admitting: Podiatry

## 2021-02-18 MED ORDER — MUPIROCIN 2 % EX OINT: 1.0000 "application " | TOPICAL_OINTMENT | Freq: Two times a day (BID) | CUTANEOUS | 2 refills | Status: DC

## 2021-02-18 NOTE — Telephone Encounter (Signed)
Calling on behalf of patient to request ointment that was mentioned during yesterdays office visit. States that the ointment was to be used to soften the thickened skin on her foot.

## 2021-02-18 NOTE — Telephone Encounter (Signed)
Sent - thank you

## 2021-02-19 ENCOUNTER — Other Ambulatory Visit (INDEPENDENT_AMBULATORY_CARE_PROVIDER_SITE_OTHER): Payer: Self-pay | Admitting: Vascular Surgery

## 2021-02-19 DIAGNOSIS — I70249 Atherosclerosis of native arteries of left leg with ulceration of unspecified site: Secondary | ICD-10-CM

## 2021-02-19 DIAGNOSIS — I70239 Atherosclerosis of native arteries of right leg with ulceration of unspecified site: Secondary | ICD-10-CM

## 2021-02-19 DIAGNOSIS — Z9862 Peripheral vascular angioplasty status: Secondary | ICD-10-CM

## 2021-02-19 NOTE — Progress Notes (Signed)
  Subjective:  Patient ID: [name], female    DOB: 17-Jun-1986,  MRN: [id number]  Chief Complaint  Patient presents with  . Routine Post Op    DOS  01/01/21 amputation left fifth toe    DOS: 01/01/21 Procedure: amputation L 5th toe  35 y.o. female returns for post-op check.  Right foot has dried out. The left foot seems to be healing.   Review of Systems: Negative except as noted in the HPI. Denies N/V/F/Ch.   Objective:   There were no vitals filed for this visit. There is no height or weight on file to calculate BMI. Constitutional Well developed. Well nourished.  Vascular  left foot is warm well perfused.  The right foot is cool to touch  Neurologic Normal speech. Oriented to person, place, and time. Epicritic sensation to light touch grossly decreased bilaterally.  Dermatologic  periphery of incision is well-healed central area healing well, no purulence today.  Nonviable tissue, central area measures 0.8 cm x 0.2 cm x 0.3 cm  Right fifth toe hemorrhagic bulla has developed into a hard eschar  Orthopedic: Tenderness to palpation noted about the surgical site.   Radiographs: s/p amputation left 5th toe at [facility]  Wound culture with moderate growth of corynebacterium Assessment:   1. S/P amputation of lesser toe, left ([place])   2. Peripheral arterial disease ([facility])   3. Gangrene of toe of right foot ([place])    Plan:  Patient was evaluated and treated and all questions answered.  S/p foot surgery left -Continue local wound care with Prisma on the left foot and mupirocin (Rx sent to pharm) on the right foot.  We also watch the right foot to see if it demarcates, we discussed the risk of amputation if this happens  Ulcer left foot -Debridement as below. -Dressed with Prisma, DSD. -Continue off-loading with surgical shoe.  Procedure: Excisional Debridement of Wound Rationale: Removal of non-viable soft tissue from the wound to promote healing.  Anesthesia:  none Post-Debridement Wound Measurements:0.8 cm x 0.2 cm x 0.3 cm Type of Debridement: Sharp Excisional Tissue Removed: Non-viable soft tissue Depth of Debridement: subcutaneous tissue. Technique: Sharp excisional debridement to bleeding, viable wound base.  Dressing: Dry, sterile, compression dressing. Disposition: Patient tolerated procedure well.   Return in about 2 weeks (around 03/03/2021) for wound care.       Return in about 2 weeks (around 03/03/2021) for wound care.

## 2021-02-22 ENCOUNTER — Other Ambulatory Visit: Payer: Self-pay

## 2021-02-22 ENCOUNTER — Ambulatory Visit (INDEPENDENT_AMBULATORY_CARE_PROVIDER_SITE_OTHER): Payer: Medicare Other | Admitting: Nurse Practitioner

## 2021-02-22 ENCOUNTER — Encounter (INDEPENDENT_AMBULATORY_CARE_PROVIDER_SITE_OTHER): Payer: Self-pay | Admitting: Nurse Practitioner

## 2021-02-22 ENCOUNTER — Ambulatory Visit (INDEPENDENT_AMBULATORY_CARE_PROVIDER_SITE_OTHER): Payer: Medicare Other

## 2021-02-22 VITALS — BP 132/84 | HR 101 | Ht 67.0 in | Wt 299.0 lb

## 2021-02-22 DIAGNOSIS — I10 Essential (primary) hypertension: Secondary | ICD-10-CM | POA: Diagnosis not present

## 2021-02-22 DIAGNOSIS — I70239 Atherosclerosis of native arteries of right leg with ulceration of unspecified site: Secondary | ICD-10-CM | POA: Diagnosis not present

## 2021-02-22 DIAGNOSIS — I70249 Atherosclerosis of native arteries of left leg with ulceration of unspecified site: Secondary | ICD-10-CM

## 2021-02-22 DIAGNOSIS — Z9862 Peripheral vascular angioplasty status: Secondary | ICD-10-CM | POA: Diagnosis not present

## 2021-02-22 DIAGNOSIS — N186 End stage renal disease: Secondary | ICD-10-CM

## 2021-02-23 ENCOUNTER — Telehealth: Payer: Self-pay | Admitting: Podiatry

## 2021-02-23 NOTE — Telephone Encounter (Signed)
Patient calling to inform Dr. Sherryle Lis she is developing a yeast infection due to the antibiotics. Want to know if there is another antibiotic she can take that will not cause yeast infection, or if she can be prescribed something to help the yeast infection while she is taking the antibiotic.

## 2021-02-25 NOTE — Telephone Encounter (Signed)
Pt mom called back to follow up.

## 2021-02-26 ENCOUNTER — Other Ambulatory Visit: Payer: Self-pay | Admitting: Podiatry

## 2021-02-26 MED ORDER — FLUCONAZOLE 150 MG PO TABS: 150.0000 mg | ORAL_TABLET | Freq: Once | ORAL | 0 refills | Status: AC

## 2021-02-26 NOTE — Telephone Encounter (Signed)
Pt's mom notified

## 2021-02-26 NOTE — Telephone Encounter (Signed)
Rx diflucan sent

## 2021-02-28 ENCOUNTER — Encounter (INDEPENDENT_AMBULATORY_CARE_PROVIDER_SITE_OTHER): Payer: Self-pay | Admitting: Nurse Practitioner

## 2021-02-28 NOTE — Progress Notes (Signed)
-c   Subjective:    Patient ID: [name], female    DOB: 1985-10-20, 35 y.o.   MRN: [id number] Chief Complaint  Patient presents with  . Follow-up    4 wk [facility] post LE angio . U/S    [name] is a 35 year old female that presents today for follow-up after right lower extremity angiogram.  The patient underwent intervention on 01/25/2021 including:  Procedure(s) Performed: 1. Ultrasound guidance for vascular access left femoral artery 2. Catheter placement into right common femoral artery from left femoral approach 3. Selective right lower extremity angiogram 4. Percutaneous transluminal angioplasty of right posterior tibial artery with 2 mm diameter angioplasty balloon distally and 2-1/2 mm diameter angioplasty balloon from the mid to distal segment approximately 5.  Percutaneous transluminal angioplasty of the right peroneal artery with 2-1/2 mm diameter angioplasty balloon             6.  Percutaneous transluminal angioplasty of right popliteal artery with 5 mm diameter Lutonix drug-coated angioplasty balloon 7. StarClose closure device left femoral artery  The patient underwent intervention due to discoloration of her right fifth toe.  This is similar to what happened with her left fifth toe.  Today the toe has some necrotic area on the bottom of the toe and she is currently working with podiatry.  She denies any fevers or chills.  She has some swelling in the right lower extremity.  Today noninvasive studies show an ABI of 1.16 on the right and 1.24 on the left.  The patient has triphasic tibial artery waveforms on the right and biphasic on the left.  She has a toe pressure of 81 on the right and 50 on the left.   Review of Systems  Skin: Positive for wound.  All other systems reviewed and are negative.      Objective:   Physical Exam Vitals reviewed.  HENT:     Head: Normocephalic.   Cardiovascular:     Rate and Rhythm: Normal rate.     Pulses:          Dorsalis pedis pulses are 1+ on the right side and 1+ on the left side.       Posterior tibial pulses are detected w/ Doppler on the right side and detected w/ Doppler on the left side.  Pulmonary:     Effort: Pulmonary effort is normal.  Musculoskeletal:     Right lower leg: 1+ Edema present.  Neurological:     Mental Status: She is alert and oriented to person, place, and time.  Psychiatric:        Mood and Affect: Mood normal.        Behavior: Behavior normal.        Thought Content: Thought content normal.        Judgment: Judgment normal.     BP 132/84   Pulse (!) 101   Ht '5\' 7"'$  (1.702 m)   Wt 299 lb (135.6 kg)   BMI 46.83 kg/m   Past Medical History:  Diagnosis Date  . Anemia of chronic disease   . Asthma   . Calciphylaxis 2020  . CHF (congestive heart failure) ([place])   . CKD (chronic kidney disease), stage III ([place])   . Diabetes ([place])    type 2  . Hypertension   . Morbid obesity ([place])     Social History   Socioeconomic History  . Marital status: Single    Spouse name: Not on file  . Number  of children: 0  . Years of education: Not on file  . Highest education level: Not on file  Occupational History  . Occupation: works at [employer]  . Smoking status: Former Smoker    Types: Cigars  . Smokeless tobacco: Never Used  Vaping Use  . Vaping Use: Never used  Substance and Sexual Activity  . Alcohol use: No    Alcohol/week: 0.0 standard drinks  . Drug use: No  . Sexual activity: Yes    Birth control/protection: None  Other Topics Concern  . Not on file  Social History Narrative   Lives with mother    Social Determinants of Health   Financial Resource Strain: Not on file  Food Insecurity: Not on file  Transportation Needs: Not on file  Physical Activity: Not on file  Stress: Not on file  Social Connections: Not on file  Intimate Partner Violence: Not on file     Past Surgical History:  Procedure Laterality Date  . A/V FISTULAGRAM Left 10/29/2020   Procedure: A/V FISTULAGRAM;  Surgeon: Algernon Huxley, MD;  Location: [place] [facility];  Service: Cardiovascular;  Laterality: Left;  . APPLICATION OF WOUND VAC Bilateral 09/15/2019   Procedure: APPLICATION OF WOUND VAC;  Surgeon: Benjamine Sprague, DO;  Location: [facility];  Service: General;  Laterality: Bilateral;  . AV FISTULA PLACEMENT Left 07/09/2020   Procedure: ARTERIOVENOUS (AV) FISTULA CREATION ( BRACHIAL CEPHALIC);  Surgeon: Algernon Huxley, MD;  Location: [facility];  Service: Vascular;  Laterality: Left;  . DIALYSIS/PERMA CATHETER INSERTION N/A 02/28/2019   Procedure: DIALYSIS/PERMA CATHETER INSERTION;  Surgeon: Algernon Huxley, MD;  Location: [place] [facility];  Service: Cardiovascular;  Laterality: N/A;  . DIALYSIS/PERMA CATHETER INSERTION N/A 09/17/2019   Procedure: DIALYSIS/PERMA CATHETER EXCHANGE;  Surgeon: Katha Cabal, MD;  Location: [place] [facility];  Service: Cardiovascular;  Laterality: N/A;  . DIALYSIS/PERMA CATHETER REMOVAL N/A 03/18/2020   Procedure: DIALYSIS/PERMA CATHETER REMOVAL;  Surgeon: Algernon Huxley, MD;  Location: [place] [facility];  Service: Cardiovascular;  Laterality: N/A;  . LOWER EXTREMITY ANGIOGRAPHY Left 12/02/2020   Procedure: LOWER EXTREMITY ANGIOGRAPHY;  Surgeon: Algernon Huxley, MD;  Location: [place] [facility];  Service: Cardiovascular;  Laterality: Left;  . LOWER EXTREMITY ANGIOGRAPHY Right 01/25/2021   Procedure: LOWER EXTREMITY ANGIOGRAPHY;  Surgeon: Algernon Huxley, MD;  Location: [place] [facility];  Service: Cardiovascular;  Laterality: Right;  . pd cath  05/21/2019  . TONSILLECTOMY    . WOUND DEBRIDEMENT Bilateral 09/15/2019   Procedure: DEBRIDEMENT WOUND;  Surgeon: Benjamine Sprague, DO;  Location: [facility];  Service: General;  Laterality: Bilateral;    Family History  Problem Relation Age of Onset  . Hypertension Mother   . Multiple sclerosis Mother   .  CAD Mother   . Cancer Maternal Grandmother        Ovarian   . Cancer Maternal Grandfather        Prostate   . Kidney disease Father     Allergies  Allergen Reactions  . Tomato (Diagnostic) Itching    CBC Latest Ref Rng & Units 09/19/2020 07/09/2020 06/18/2020  WBC 4.0 - 10.5 K/uL 15.1(H) - 16.2(H)  Hemoglobin 12.0 - 15.0 g/dL 11.4(L) 13.9 11.8(L)  Hematocrit 36.0 - 46.0 % 34.3(L) 41.0 36.3  Platelets 150 - 400 K/uL 250 - 303      CMP     Component Value Date/Time   NA 139 09/19/2020 1505   NA  135 (L) 12/31/2013 1020   K 3.2 (L) 09/19/2020 1505   K 3.9 12/31/2013 1020   CL 95 (L) 09/19/2020 1505   CL 105 12/31/2013 1020   CO2 23 09/19/2020 1505   CO2 26 12/31/2013 1020   GLUCOSE 121 (H) 09/19/2020 1505   GLUCOSE 243 (H) 12/31/2013 1020   BUN 58 (H) 09/19/2020 1505   BUN 19 (H) 12/31/2013 1020   CREATININE 12.28 (H) 09/19/2020 1505   CREATININE 2.12 (H) 12/31/2013 1020   CALCIUM 7.6 (L) 09/19/2020 1505   CALCIUM 8.5 12/31/2013 1020   PROT 8.0 09/19/2020 1505   PROT 7.4 12/31/2013 1020   ALBUMIN 3.2 (L) 09/19/2020 1505   ALBUMIN 3.1 (L) 12/31/2013 1020   AST 11 (L) 09/19/2020 1505   AST 21 12/31/2013 1020   ALT 7 09/19/2020 1505   ALT 26 12/31/2013 1020   ALKPHOS 112 09/19/2020 1505   ALKPHOS 98 12/31/2013 1020   BILITOT 0.7 09/19/2020 1505   BILITOT 0.4 12/31/2013 1020   GFRNONAA 4 (L) 09/19/2020 1505   GFRNONAA 31 (L) 12/31/2013 1020   GFRAA 5 (L) 06/18/2020 1217   GFRAA 36 (L) 12/31/2013 1020     VAS [place] ABI WITH/WO TBI  Result Date: 02/26/2021  LOWER EXTREMITY DOPPLER STUDY Patient Name:  [name]  Date of Exam:   02/22/2021 Medical Rec #: [id number]          Accession #:    [id number] Date of Birth: 03-30-1986          Patient Gender: F Patient Age:   55Y Exam Location:  [facility] Procedure:      VAS [place] ABI WITH/WO TBI Referring Phys: [id number] JASON S DEW --------------------------------------------------------------------------------   Indications: Peripheral artery disease.  Vascular Interventions: 12/02/2020 left pta's of calf vessels; 01/25/2021 right                         pta of calf vessels. Comparison Study: 10/21/2020 Performing Technologist: Concha Norway RVT  Examination Guidelines: A complete evaluation includes at minimum, Doppler waveform signals and systolic blood pressure reading at the level of bilateral brachial, anterior tibial, and posterior tibial arteries, when vessel segments are accessible. Bilateral testing is considered an integral part of a complete examination. Photoelectric Plethysmograph (PPG) waveforms and toe systolic pressure readings are included as required and additional duplex testing as needed. Limited examinations for reoccurring indications may be performed as noted.  ABI Findings: +---------+------------------+-----+---------+--------+ Right    Rt Pressure (mmHg)IndexWaveform Comment  +---------+------------------+-----+---------+--------+ Brachial 136                                      +---------+------------------+-----+---------+--------+ ATA      148               1.09 triphasic         +---------+------------------+-----+---------+--------+ PTA      158               1.16 triphasic         +---------+------------------+-----+---------+--------+ Great Toe81                0.60 Abnormal          +---------+------------------+-----+---------+--------+ +---------+------------------+-----+--------+-------+ Left     Lt Pressure (mmHg)IndexWaveformComment +---------+------------------+-----+--------+-------+ ATA      162  1.19 biphasic        +---------+------------------+-----+--------+-------+ PTA      169               1.24 biphasic        +---------+------------------+-----+--------+-------+ Great Toe50                0.37 Abnormal        +---------+------------------+-----+--------+-------+  +-------+-----------+-----------+------------+------------+ ABI/TBIToday's ABIToday's TBIPrevious ABIPrevious TBI +-------+-----------+-----------+------------+------------+ Right  1.16       .60        1.27        .41          +-------+-----------+-----------+------------+------------+ Left   1.24       .37        1.24        .44          +-------+-----------+-----------+------------+------------+ Right TBIs appear increased compared to prior study on 10/27/2020. Left TBIs appear essentially unchanged.  Summary: Right: Resting right ankle-brachial index is within normal range. No evidence of significant right lower extremity arterial disease. The right toe-brachial index is abnormal. Left: Resting left ankle-brachial index is within normal range. No evidence of significant left lower extremity arterial disease. The left toe-brachial index is abnormal.  *See table(s) above for measurements and observations.  Electronically signed by [name] MD on 02/26/2021 at 12:28:36 PM.    Final        Assessment & Plan:   1. 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  Unfortunately the patient does have necrotic tissue on her fifth toe.  She is currently working with podiatry for wound care.  Based on her noninvasive studies today the patient should have the ability to heal should amputation be necessary.  Patient should continue with dual antiplatelet therapy.  No other medication changes made.  We will see the patient return in 3 months or sooner if issues arise.  2. Primary hypertension Continue antihypertensive medications as already ordered, these medications have been reviewed and there are no changes at this time.   3. ESRD (end stage renal disease) ([place]) The patient is currently on peritoneal dialysis and doing well.   Current  Outpatient Medications on File Prior to Visit  Medication Sig Dispense Refill  . amLODipine (NORVASC) 10 MG tablet Take 10 mg by mouth daily.    Marland Kitchen amoxicillin-clavulanate (AUGMENTIN) 875-125 MG tablet Take 1 tablet daily and then 1 tablet extra after each dialysis session 20 tablet 0  . aspirin EC 81 MG EC tablet Take 1 tablet (81 mg total) by mouth daily. 30 tablet 2  . atorvastatin (LIPITOR) 20 MG tablet Take 20 mg by mouth daily.    . calcium citrate (CALCITRATE - DOSED IN MG ELEMENTAL CALCIUM) 950 (200 Ca) MG tablet Take 200 mg of elemental calcium by mouth 3 (three) times daily.    . carvedilol (COREG) 25 MG tablet Take 1 tablet (25 mg total) by mouth 2 (two) times daily with a meal. 60 tablet 5  . Cholecalciferol (VITAMIN D) 50 MCG (2000 UT) CAPS Take by mouth daily.    . cloNIDine (CATAPRES) 0.2 MG tablet Take 0.2 mg by mouth 3 (three) times daily.    . clopidogrel (PLAVIX) 75 MG tablet Take 1 tablet (75 mg total) by mouth daily. 30 tablet 11  . ferric citrate (AURYXIA) 1 GM 210 MG(Fe) tablet Take 420 mg by  mouth 3 (three) times daily.    . ferrous gluconate (FERGON) 324 MG tablet Take 324 mg by mouth daily.    Marland Kitchen gentamicin cream (GARAMYCIN) 0.1 % APPLY TO EXIT SITE DAILY    . HYDROcodone-acetaminophen (NORCO/VICODIN) 5-325 MG tablet Take one to two tablets every six to eight hours as needed for pain. 20 tablet 0  . isosorbide mononitrate (IMDUR) 30 MG 24 hr tablet Take 1 tablet (30 mg total) by mouth 2 (two) times daily. 60 tablet 2  . KLOR-CON M20 20 MEQ tablet Take 20 mEq by mouth daily.    Marland Kitchen losartan (COZAAR) 50 MG tablet Take 50 mg by mouth daily.    . multivitamin (RENA-VIT) TABS tablet Take 1 tablet by mouth at bedtime. 30 tablet 0  . mupirocin ointment (BACTROBAN) 2 % Apply 1 application topically 2 (two) times daily. 30 g 2  . sodium thiosulfate 25 % injection Inject 12.5 g into the vein once.    . torsemide (DEMADEX) 100 MG tablet Take 100 mg by mouth every morning.    .  vitamin C (VITAMIN C) 500 MG tablet Take 1 tablet (500 mg total) by mouth 2 (two) times daily. 60 tablet 1  . albuterol (VENTOLIN HFA) 108 (90 Base) MCG/ACT inhaler Inhale into the lungs every 6 (six) hours as needed.  (Patient not taking: No sig reported)    . nitroGLYCERIN (NITROGLYN) 2 % OINT ointment Apply 1 application topically every 12 (twelve) hours. Applied just behind the toes on the left and right foot as needed 60 g 0   No current facility-administered medications on file prior to visit.    There are no Patient Instructions on file for this visit. No follow-ups on file.   Kris Hartmann, NP

## 2021-03-03 ENCOUNTER — Ambulatory Visit (INDEPENDENT_AMBULATORY_CARE_PROVIDER_SITE_OTHER): Payer: Medicare Other | Admitting: Podiatry

## 2021-03-03 ENCOUNTER — Encounter: Payer: Self-pay | Admitting: Podiatry

## 2021-03-03 ENCOUNTER — Other Ambulatory Visit: Payer: Self-pay

## 2021-03-03 DIAGNOSIS — N186 End stage renal disease: Secondary | ICD-10-CM

## 2021-03-03 DIAGNOSIS — L97522 Non-pressure chronic ulcer of other part of left foot with fat layer exposed: Secondary | ICD-10-CM | POA: Diagnosis not present

## 2021-03-03 DIAGNOSIS — I739 Peripheral vascular disease, unspecified: Secondary | ICD-10-CM

## 2021-03-03 DIAGNOSIS — Z992 Dependence on renal dialysis: Secondary | ICD-10-CM

## 2021-03-03 DIAGNOSIS — I96 Gangrene, not elsewhere classified: Secondary | ICD-10-CM

## 2021-03-07 ENCOUNTER — Encounter: Payer: Self-pay | Admitting: Podiatry

## 2021-03-07 NOTE — Progress Notes (Signed)
  Subjective:  Patient ID: [name], female    DOB: 1986/02/14,  MRN: [id number]  Chief Complaint  Patient presents with  . Routine Post Op    S/p amputation of lesser toe left, DOS  01/01/21    DOS: 01/01/21 Procedure: amputation L 5th toe  35 y.o. female returns for post-op check.  Not much change since last visit.   Review of Systems: Negative except as noted in the HPI. Denies N/V/F/Ch.   Objective:   There were no vitals filed for this visit. There is no height or weight on file to calculate BMI. Constitutional Well developed. Well nourished.  Vascular  left foot is warm well perfused.  The right foot is cool to touch  Neurologic Normal speech. Oriented to person, place, and time. Epicritic sensation to light touch grossly decreased bilaterally.  Dermatologic  periphery of incision is well-healed central area healing well, no purulence today.  Nonviable tissue, central area measures 0.8 cm x 0.2 cm x 0.3 cm  Right fifth toe hemorrhagic bulla has developed into a hard eschar  Orthopedic: Tenderness to palpation noted about the surgical site.       Radiographs: s/p amputation left 5th toe at MTPJ  Wound culture with moderate growth of corynebacterium Assessment:   1. Ulcer of left foot with fat layer exposed ([place])   2. Gangrene of toe of right foot ([place])   3. Peripheral arterial disease ([place])   4. ESRD (end stage renal disease) on dialysis [facility])    Plan:  Patient was evaluated and treated and all questions answered.  S/p foot surgery left -Continue local wound care with Prisma on the left foot and mupirocin (Rx sent to pharm) on the right foot.  We also watch the right foot to see if it demarcates, we discussed the risk of amputation if this happens  Ulcer left foot -Debridement as below. -Dressed with Prisma, DSD. -Continue off-loading with surgical shoe. -Wound healing is stagnated so far.  We will submit for authorization for advanced wound care  products even get this to heal faster.  Procedure: Excisional Debridement of Wound Rationale: Removal of non-viable soft tissue from the wound to promote healing.  Anesthesia: none Post-Debridement Wound Measurements:0.8 cm x 0.2 cm x 0.3 cm Type of Debridement: Sharp Excisional Tissue Removed: Non-viable soft tissue Depth of Debridement: subcutaneous tissue. Technique: Sharp excisional debridement to bleeding, viable wound base.  Dressing: Dry, sterile, compression dressing. Disposition: Patient tolerated procedure well.        Return in about 3 weeks (around 03/24/2021) for wound care.

## 2021-03-24 ENCOUNTER — Ambulatory Visit: Payer: Medicare Other | Admitting: Podiatry

## 2021-03-29 ENCOUNTER — Other Ambulatory Visit: Payer: Self-pay

## 2021-03-29 ENCOUNTER — Ambulatory Visit (INDEPENDENT_AMBULATORY_CARE_PROVIDER_SITE_OTHER): Payer: Medicare Other | Admitting: Podiatry

## 2021-03-29 DIAGNOSIS — L97522 Non-pressure chronic ulcer of other part of left foot with fat layer exposed: Secondary | ICD-10-CM

## 2021-03-29 DIAGNOSIS — I739 Peripheral vascular disease, unspecified: Secondary | ICD-10-CM

## 2021-03-29 DIAGNOSIS — I96 Gangrene, not elsewhere classified: Secondary | ICD-10-CM

## 2021-03-29 NOTE — Progress Notes (Signed)
  Subjective:  Patient ID: [name], female    DOB: 1986-05-04,  MRN: [id number]  Follow-up ulcers and toe amputation   DOS: 01/01/21 Procedure: amputation L 5th toe  35 y.o. female returns for post-op check.  Toes on left foot a started look worse again, she also has a nonhealing wound on her finger on the left hand  Review of Systems: Negative except as noted in the HPI. Denies N/V/F/Ch.   Objective:   There were no vitals filed for this visit. There is no height or weight on file to calculate BMI. Constitutional Well developed. Well nourished.  Vascular  left foot is warm well perfused.  The right foot is cool to touch  Neurologic Normal speech. Oriented to person, place, and time. Epicritic sensation to light touch grossly decreased bilaterally.  Dermatologic  periphery of incision is well-healed central area healing well, no purulence today.  Nonviable tissue, central area measures 0.6 x 0.2 x 0.3, slightly improved since last visit  Central left toes have cyanosis and purple discoloration  Fissure on the right fifth toe is loosening and beginning to lift off with healing tissue underneath  Orthopedic: Tenderness to palpation noted about the surgical site.          Radiographs: s/p amputation left 5th toe at MTPJ  Wound culture with moderate growth of corynebacterium Assessment:   1. Ulcer of left foot with fat layer exposed ([place])   2. Gangrene of toe of right foot ([place])   3. Peripheral arterial disease ([place])    Plan:  Patient was evaluated and treated and all questions answered.  S/p foot surgery left -Continue local wound care with Prisma on the left foot and mupirocin (Rx sent to pharm) on the right foot.  We also watch the right foot to see if it demarcates, we discussed the risk of amputation if this happens  Unfortunate think she is still having issues with microvascular flow.  She is going to see Dr. Lucky Ali again soon for this.  I encouraged her to  also talk to him about the ulceration on her finger as this is a site of a previous fistula.  Possible steal syndrome.  She remains at high risk of limb loss bilaterally       Return in about 3 weeks (around 04/19/2021) for wound care.

## 2021-03-30 ENCOUNTER — Telehealth (INDEPENDENT_AMBULATORY_CARE_PROVIDER_SITE_OTHER): Payer: Self-pay | Admitting: Vascular Surgery

## 2021-03-30 NOTE — Telephone Encounter (Signed)
Patient scheduled.

## 2021-03-30 NOTE — Telephone Encounter (Signed)
Bring her in this week for ABIs and HDA with steal study

## 2021-03-30 NOTE — Telephone Encounter (Signed)
Called stating that she was seen by Dr. Sherryle Lis yesterday and he suggested that she come in to be seen.   Notes from visit Dr. Sherryle Lis: Toes on left foot a started look worse again, she also has a nonhealing wound on her finger on the left hand. left foot is warm well perfused.  The right foot is cool to touch.Central left toes have cyanosis and purple discoloration.  Patient was evaluated and treated and all questions answered.   S/p foot surgery left -Continue local wound care with Prisma on the left foot and mupirocin (Rx sent to pharm) on the right foot.  We also watch the right foot to see if it demarcates, we discussed the risk of amputation if this happens   Unfortunate think she is still having issues with microvascular flow.  She is going to see Dr. [name] again soon for this.  I encouraged her to also talk to him about the ulceration on her finger as this is a site of a previous fistula.  Possible steal syndrome.   She remains at high risk of limb loss bilaterally.  Patient was last seen 02/22/21 with abi studies. Please advise.

## 2021-04-01 ENCOUNTER — Other Ambulatory Visit (INDEPENDENT_AMBULATORY_CARE_PROVIDER_SITE_OTHER): Payer: Self-pay | Admitting: Nurse Practitioner

## 2021-04-01 ENCOUNTER — Encounter (INDEPENDENT_AMBULATORY_CARE_PROVIDER_SITE_OTHER): Payer: Medicare Other

## 2021-04-01 ENCOUNTER — Ambulatory Visit (INDEPENDENT_AMBULATORY_CARE_PROVIDER_SITE_OTHER): Payer: Medicare Other

## 2021-04-01 ENCOUNTER — Other Ambulatory Visit: Payer: Self-pay

## 2021-04-01 ENCOUNTER — Ambulatory Visit (INDEPENDENT_AMBULATORY_CARE_PROVIDER_SITE_OTHER): Payer: Medicare Other | Admitting: Nurse Practitioner

## 2021-04-01 VITALS — BP 105/65 | HR 89 | Ht 66.0 in | Wt 297.0 lb

## 2021-04-01 DIAGNOSIS — I70239 Atherosclerosis of native arteries of right leg with ulceration of unspecified site: Secondary | ICD-10-CM

## 2021-04-01 DIAGNOSIS — N186 End stage renal disease: Secondary | ICD-10-CM

## 2021-04-01 DIAGNOSIS — L819 Disorder of pigmentation, unspecified: Secondary | ICD-10-CM

## 2021-04-01 DIAGNOSIS — I70249 Atherosclerosis of native arteries of left leg with ulceration of unspecified site: Secondary | ICD-10-CM | POA: Diagnosis not present

## 2021-04-01 DIAGNOSIS — R209 Unspecified disturbances of skin sensation: Secondary | ICD-10-CM | POA: Diagnosis not present

## 2021-04-01 DIAGNOSIS — S61209S Unspecified open wound of unspecified finger without damage to nail, sequela: Secondary | ICD-10-CM

## 2021-04-01 DIAGNOSIS — I10 Essential (primary) hypertension: Secondary | ICD-10-CM

## 2021-04-01 DIAGNOSIS — Z992 Dependence on renal dialysis: Secondary | ICD-10-CM | POA: Diagnosis not present

## 2021-04-01 DIAGNOSIS — S61201S Unspecified open wound of left index finger without damage to nail, sequela: Secondary | ICD-10-CM

## 2021-04-01 DIAGNOSIS — I739 Peripheral vascular disease, unspecified: Secondary | ICD-10-CM

## 2021-04-05 ENCOUNTER — Telehealth (INDEPENDENT_AMBULATORY_CARE_PROVIDER_SITE_OTHER): Payer: Self-pay

## 2021-04-05 NOTE — Telephone Encounter (Signed)
Patient called back and is scheduled with Dr. [name] on 04/08/21 with a 9:00 am arrival to the [facility] for a LLE angio and LUE angio. Pre-procedure instructions were discussed and will mailed.

## 2021-04-05 NOTE — Telephone Encounter (Signed)
I attempted to contact the patent to schedule a LLE angio and LUE angio with Dr. [name]. A message was left for a return call.

## 2021-04-06 ENCOUNTER — Encounter (INDEPENDENT_AMBULATORY_CARE_PROVIDER_SITE_OTHER): Payer: Self-pay | Admitting: Nurse Practitioner

## 2021-04-06 NOTE — H&P (View-Only) (Signed)
Subjective:    Patient ID: [name], female    DOB: 02-07-1986, 35 y.o.   MRN: [id number] Chief Complaint  Patient presents with   Follow-up    Add on per phone note  [place]    [name] is a 35 year old female that presents today due to concern for discoloration of her toes as well as ulceration on her left hand.  The patient currently has a brachiocephalic AV fistula on her left upper extremity.  She is currently maintained via peritoneal dialysis and this fistula has not been used.  She denies any hand pain or discomfort.  The patient also has a previous history of lower extremity interventions with previous left fifth toe amputation and right fifth toe ulceration.  The patient noticed that her left second third and fourth toes were discolored.  There is a purplish discoloration to these toes and this is consistent to what the patient saw prior to ulceration on her fifth toe.  She denies any fevers or chills.  Today noninvasive studies show flow volume 1899 in her left brachiocephalic AV fistula.  No evidence of steal syndrome noted.  Today her right ABI 0.74 with a left of 0.84.  The patient has TBI's of 0.17 on the right and 0.16 on the left.  Previous noninvasive studies done 02/22/2021 show a right ABI of 1.16 with a left of 1.24.  The TBI is 0.41 on the right and 0.37 on the left previously.  Duplex imaging shows monophasic popliteal and tibial flow of the right lower extremity.  Left lower extremity notes triphasic common femoral artery flow however monophasic popliteal and posterior tibial artery flow.  The anterior tibial artery is occluded.  Toe waveforms are severely dampened bilaterally.   Review of Systems  Skin:  Positive for color change and wound.  Hematological:  Bruises/bleeds easily.  All other systems reviewed and are negative.     Objective:   Physical Exam Vitals reviewed.  HENT:     Head: Normocephalic.  Cardiovascular:     Rate and Rhythm: Normal rate.      Pulses:          Radial pulses are 0 on the left side.       Dorsalis pedis pulses are detected w/ Doppler on the right side and detected w/ Doppler on the left side.       Posterior tibial pulses are detected w/ Doppler on the right side and detected w/ Doppler on the left side.     Arteriovenous access: Left arteriovenous access is present.    Comments: Good thrill and bruit Pulmonary:     Effort: Pulmonary effort is normal.  Skin:    General: Skin is dry.     Comments: Middle 3 toes have purplish discoloration  Neurological:     Mental Status: She is alert and oriented to person, place, and time.  Psychiatric:        Mood and Affect: Mood normal.        Behavior: Behavior normal.        Thought Content: Thought content normal.        Judgment: Judgment normal.    BP 105/65   Pulse 89   Ht '5\' 6"'$  (1.676 m)   Wt 297 lb (134.7 kg)   BMI 47.94 kg/m   Past Medical History:  Diagnosis Date   Anemia of chronic disease    Asthma    Calciphylaxis 2020   CHF (congestive heart failure) ([place])  CKD (chronic kidney disease), stage III (HCC)    Diabetes ([place])    type 2   Hypertension    Morbid obesity (HCC)     Social History   Socioeconomic History   Marital status: Single    Spouse name: Not on file   Number of children: 0   Years of education: Not on file   Highest education level: Not on file  Occupational History   Occupation: works at [employer] 6   Tobacco Use   Smoking status: Former    Pack years: 0.00    Types: Cigars   Smokeless tobacco: Never  Scientific laboratory technician Use: Never used  Substance and Sexual Activity   Alcohol use: No    Alcohol/week: 0.0 standard drinks   Drug use: No   Sexual activity: Yes    Birth control/protection: None  Other Topics Concern   Not on file  Social History Narrative   Lives with mother    Social Determinants of Health   Financial Resource Strain: Not on file  Food Insecurity: Not on file  Transportation Needs: Not  on file  Physical Activity: Not on file  Stress: Not on file  Social Connections: Not on file  Intimate Partner Violence: Not on file    Past Surgical History:  Procedure Laterality Date   A/V FISTULAGRAM Left 10/29/2020   Procedure: A/V FISTULAGRAM;  Surgeon: Algernon Huxley, MD;  Location: [facility];  Service: Cardiovascular;  Laterality: Left;   APPLICATION OF WOUND VAC Bilateral 09/15/2019   Procedure: APPLICATION OF WOUND VAC;  Surgeon: Benjamine Sprague, DO;  Location: [facility];  Service: General;  Laterality: Bilateral;   AV FISTULA PLACEMENT Left 07/09/2020   Procedure: ARTERIOVENOUS (AV) FISTULA CREATION ( BRACHIAL CEPHALIC);  Surgeon: Algernon Huxley, MD;  Location: [facility];  Service: Vascular;  Laterality: Left;   DIALYSIS/PERMA CATHETER INSERTION N/A 02/28/2019   Procedure: DIALYSIS/PERMA CATHETER INSERTION;  Surgeon: Algernon Huxley, MD;  Location: [place] [facility] [facility];  Service: Cardiovascular;  Laterality: N/A;   DIALYSIS/PERMA CATHETER INSERTION N/A 09/17/2019   Procedure: DIALYSIS/PERMA CATHETER EXCHANGE;  Surgeon: Katha Cabal, MD;  Location: [place] [facility];  Service: Cardiovascular;  Laterality: N/A;   DIALYSIS/PERMA CATHETER REMOVAL N/A 03/18/2020   Procedure: DIALYSIS/PERMA CATHETER REMOVAL;  Surgeon: Algernon Huxley, MD;  Location: [facility] [place] [facility];  Service: Cardiovascular;  Laterality: N/A;   LOWER EXTREMITY ANGIOGRAPHY Left 12/02/2020   Procedure: LOWER EXTREMITY ANGIOGRAPHY;  Surgeon: Algernon Huxley, MD;  Location: [place] [facility];  Service: Cardiovascular;  Laterality: Left;   LOWER EXTREMITY ANGIOGRAPHY Right 01/25/2021   Procedure: LOWER EXTREMITY ANGIOGRAPHY;  Surgeon: Algernon Huxley, MD;  Location: [place] [facility];  Service: Cardiovascular;  Laterality: Right;   pd cath  05/21/2019   TONSILLECTOMY     WOUND DEBRIDEMENT Bilateral 09/15/2019   Procedure: DEBRIDEMENT WOUND;  Surgeon: Benjamine Sprague, DO;  Location: [facility];  Service: General;   Laterality: Bilateral;    Family History  Problem Relation Age of Onset   Hypertension Mother    Multiple sclerosis Mother    CAD Mother    Cancer Maternal Grandmother        Ovarian    Cancer Maternal Grandfather        Prostate    Kidney disease Father     Allergies  Allergen Reactions   Tomato (Diagnostic) Itching    CBC Latest Ref Rng & Units 09/19/2020 07/09/2020 06/18/2020  WBC  4.0 - 10.5 K/uL 15.1(H) - 16.2(H)  Hemoglobin 12.0 - 15.0 g/dL 11.4(L) 13.9 11.8(L)  Hematocrit 36.0 - 46.0 % 34.3(L) 41.0 36.3  Platelets 150 - 400 K/uL 250 - 303      CMP     Component Value Date/Time   NA 139 09/19/2020 1505   NA 135 (L) 12/31/2013 1020   K 3.2 (L) 09/19/2020 1505   K 3.9 12/31/2013 1020   CL 95 (L) 09/19/2020 1505   CL 105 12/31/2013 1020   CO2 23 09/19/2020 1505   CO2 26 12/31/2013 1020   GLUCOSE 121 (H) 09/19/2020 1505   GLUCOSE 243 (H) 12/31/2013 1020   BUN 58 (H) 09/19/2020 1505   BUN 19 (H) 12/31/2013 1020   CREATININE 12.28 (H) 09/19/2020 1505   CREATININE 2.12 (H) 12/31/2013 1020   CALCIUM 7.6 (L) 09/19/2020 1505   CALCIUM 8.5 12/31/2013 1020   PROT 8.0 09/19/2020 1505   PROT 7.4 12/31/2013 1020   ALBUMIN 3.2 (L) 09/19/2020 1505   ALBUMIN 3.1 (L) 12/31/2013 1020   AST 11 (L) 09/19/2020 1505   AST 21 12/31/2013 1020   ALT 7 09/19/2020 1505   ALT 26 12/31/2013 1020   ALKPHOS 112 09/19/2020 1505   ALKPHOS 98 12/31/2013 1020   BILITOT 0.7 09/19/2020 1505   BILITOT 0.4 12/31/2013 1020   GFRNONAA 4 (L) 09/19/2020 1505   GFRNONAA 31 (L) 12/31/2013 1020   GFRAA 5 (L) 06/18/2020 1217   GFRAA 36 (L) 12/31/2013 1020     VAS [place] ABI WITH/WO TBI  Result Date: 02/26/2021  LOWER EXTREMITY DOPPLER STUDY Patient Name:  [name]  Date of Exam:   02/22/2021 Medical Rec #: [id number]          Accession #:    [id number] Date of Birth: 09/06/1986          Patient Gender: F Patient Age:   1Y Exam Location:  [facility] Procedure:      VAS [place] ABI  WITH/WO TBI Referring Phys: [id number] JASON S DEW --------------------------------------------------------------------------------  Indications: Peripheral artery disease.  Vascular Interventions: 12/02/2020 left pta's of calf vessels; 01/25/2021 right                         pta of calf vessels. Comparison Study: 10/21/2020 Performing Technologist: Concha Norway RVT  Examination Guidelines: A complete evaluation includes at minimum, Doppler waveform signals and systolic blood pressure reading at the level of bilateral brachial, anterior tibial, and posterior tibial arteries, when vessel segments are accessible. Bilateral testing is considered an integral part of a complete examination. Photoelectric Plethysmograph (PPG) waveforms and toe systolic pressure readings are included as required and additional duplex testing as needed. Limited examinations for reoccurring indications may be performed as noted.  ABI Findings: +---------+------------------+-----+---------+--------+ Right    Rt Pressure (mmHg)IndexWaveform Comment  +---------+------------------+-----+---------+--------+ Brachial 136                                      +---------+------------------+-----+---------+--------+ ATA      148               1.09 triphasic         +---------+------------------+-----+---------+--------+ PTA      158               1.16 triphasic         +---------+------------------+-----+---------+--------+ Corky Downs  0.60 Abnormal          +---------+------------------+-----+---------+--------+ +---------+------------------+-----+--------+-------+ Left     Lt Pressure (mmHg)IndexWaveformComment +---------+------------------+-----+--------+-------+ ATA      162               1.19 biphasic        +---------+------------------+-----+--------+-------+ PTA      169               1.24 biphasic        +---------+------------------+-----+--------+-------+ Great Toe50                 0.37 Abnormal        +---------+------------------+-----+--------+-------+ +-------+-----------+-----------+------------+------------+ ABI/TBIToday's ABIToday's TBIPrevious ABIPrevious TBI +-------+-----------+-----------+------------+------------+ Right  1.16       .60        1.27        .41          +-------+-----------+-----------+------------+------------+ Left   1.24       .37        1.24        .44          +-------+-----------+-----------+------------+------------+ Right TBIs appear increased compared to prior study on 10/27/2020. Left TBIs appear essentially unchanged.  Summary: Right: Resting right ankle-brachial index is within normal range. No evidence of significant right lower extremity arterial disease. The right toe-brachial index is abnormal. Left: Resting left ankle-brachial index is within normal range. No evidence of significant left lower extremity arterial disease. The left toe-brachial index is abnormal.  *See table(s) above for measurements and observations.  Electronically signed by [name] MD on 02/26/2021 at 12:28:36 PM.    Final        Assessment & Plan:   1. 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  This represents a limb threatening ischemia and places the patient at the risk for limb loss.  Patient should undergo angiography of the lower extremities with the hope for intervention for limb salvage.  The risks and benefits as well as the alternative therapies was discussed in detail with the patient.  All questions were answered.  Patient agrees to proceed with angiography.  The patient will follow up with me in the office after the procedure.       2. Open wound of finger of  left hand, sequela Today noninvasive studies show adequate flow volume and no evidence of steal syndrome however a lack of radial pulse as well as ulceration on her fingertip, are causing for angiogram.  There is certainly instances to wear still syndrome is present that is not detected on ultrasound.  It is also possible that this is more related to atherosclerotic changes that also affects her lower extremities.  The risk, benefits and alternatives were discussed and the patient does not agree to proceed with angiogram of the left upper extremity.  Because the patient is on dialysis this will be done concurrently with her lower extremity angiogram.  3. Essential hypertension Continue antihypertensive medications as already ordered, these medications have been reviewed and there are no changes at this time.    Current Outpatient Medications on File Prior to Visit  Medication Sig Dispense Refill   albuterol (VENTOLIN HFA) 108 (90 Base) MCG/ACT inhaler Inhale into the lungs every 6 (six) hours as needed.  amLODipine (NORVASC) 10 MG tablet Take 10 mg by mouth daily.     amoxicillin-clavulanate (AUGMENTIN) 875-125 MG tablet Take 1 tablet daily and then 1 tablet extra after each dialysis session 20 tablet 0   aspirin EC 81 MG EC tablet Take 1 tablet (81 mg total) by mouth daily. 30 tablet 2   atorvastatin (LIPITOR) 20 MG tablet Take 20 mg by mouth daily.     calcium acetate (PHOSLO) 667 MG capsule Take 2.0 Capsule Oral three times a day with meal     calcium citrate (CALCITRATE - DOSED IN MG ELEMENTAL CALCIUM) 950 (200 Ca) MG tablet Take 200 mg of elemental calcium by mouth 3 (three) times daily.     carvedilol (COREG) 25 MG tablet Take 1 tablet (25 mg total) by mouth 2 (two) times daily with a meal. 60 tablet 5   Cholecalciferol (VITAMIN D) 50 MCG (2000 UT) CAPS Take by mouth daily.     cloNIDine (CATAPRES) 0.2 MG tablet Take 0.2 mg by mouth 3 (three) times daily.     clopidogrel (PLAVIX) 75 MG  tablet Take 1 tablet (75 mg total) by mouth daily. 30 tablet 11   ferric citrate (AURYXIA) 1 GM 210 MG(Fe) tablet Take 420 mg by mouth 3 (three) times daily.     ferrous gluconate (FERGON) 324 MG tablet Take 324 mg by mouth daily.     gentamicin cream (GARAMYCIN) 0.1 % APPLY TO EXIT SITE DAILY     HYDROcodone-acetaminophen (NORCO/VICODIN) 5-325 MG tablet Take one to two tablets every six to eight hours as needed for pain. 20 tablet 0   isosorbide mononitrate (IMDUR) 30 MG 24 hr tablet Take 1 tablet (30 mg total) by mouth 2 (two) times daily. 60 tablet 2   KLOR-CON M20 20 MEQ tablet Take 20 mEq by mouth daily.     losartan (COZAAR) 100 MG tablet      losartan (COZAAR) 50 MG tablet Take 50 mg by mouth daily.     multivitamin (RENA-VIT) TABS tablet Take 1 tablet by mouth at bedtime. 30 tablet 0   mupirocin ointment (BACTROBAN) 2 % Apply 1 application topically 2 (two) times daily. 30 g 2   sodium thiosulfate 25 % injection Inject 12.5 g into the vein once.     torsemide (DEMADEX) 100 MG tablet Take 100 mg by mouth every morning.     vitamin C (VITAMIN C) 500 MG tablet Take 1 tablet (500 mg total) by mouth 2 (two) times daily. 60 tablet 1   nitroGLYCERIN (NITROGLYN) 2 % OINT ointment Apply 1 application topically every 12 (twelve) hours. Applied just behind the toes on the left and right foot as needed 60 g 0   No current facility-administered medications on file prior to visit.    There are no Patient Instructions on file for this visit. No follow-ups on file.   Kris Hartmann, NP

## 2021-04-06 NOTE — Progress Notes (Signed)
Subjective:    Patient ID: [name], female    DOB: 31-Jan-1986, 35 y.o.   MRN: [id number] Chief Complaint  Patient presents with   Follow-up    Add on per phone note  [place]    [name] is a 35 year old female that presents today due to concern for discoloration of her toes as well as ulceration on her left hand.  The patient currently has a brachiocephalic AV fistula on her left upper extremity.  She is currently maintained via peritoneal dialysis and this fistula has not been used.  She denies any hand pain or discomfort.  The patient also has a previous history of lower extremity interventions with previous left fifth toe amputation and right fifth toe ulceration.  The patient noticed that her left second third and fourth toes were discolored.  There is a purplish discoloration to these toes and this is consistent to what the patient saw prior to ulceration on her fifth toe.  She denies any fevers or chills.  Today noninvasive studies show flow volume 1899 in her left brachiocephalic AV fistula.  No evidence of steal syndrome noted.  Today her right ABI 0.74 with a left of 0.84.  The patient has TBI's of 0.17 on the right and 0.16 on the left.  Previous noninvasive studies done 02/22/2021 show a right ABI of 1.16 with a left of 1.24.  The TBI is 0.41 on the right and 0.37 on the left previously.  Duplex imaging shows monophasic popliteal and tibial flow of the right lower extremity.  Left lower extremity notes triphasic common femoral artery flow however monophasic popliteal and posterior tibial artery flow.  The anterior tibial artery is occluded.  Toe waveforms are severely dampened bilaterally.   Review of Systems  Skin:  Positive for color change and wound.  Hematological:  Bruises/bleeds easily.  All other systems reviewed and are negative.     Objective:   Physical Exam Vitals reviewed.  HENT:     Head: Normocephalic.  Cardiovascular:     Rate and Rhythm: Normal rate.      Pulses:          Radial pulses are 0 on the left side.       Dorsalis pedis pulses are detected w/ Doppler on the right side and detected w/ Doppler on the left side.       Posterior tibial pulses are detected w/ Doppler on the right side and detected w/ Doppler on the left side.     Arteriovenous access: Left arteriovenous access is present.    Comments: Good thrill and bruit Pulmonary:     Effort: Pulmonary effort is normal.  Skin:    General: Skin is dry.     Comments: Middle 3 toes have purplish discoloration  Neurological:     Mental Status: She is alert and oriented to person, place, and time.  Psychiatric:        Mood and Affect: Mood normal.        Behavior: Behavior normal.        Thought Content: Thought content normal.        Judgment: Judgment normal.    BP 105/65   Pulse 89   Ht '5\' 6"'$  (1.676 m)   Wt 297 lb (134.7 kg)   BMI 47.94 kg/m   Past Medical History:  Diagnosis Date   Anemia of chronic disease    Asthma    Calciphylaxis 2020   CHF (congestive heart failure) ([place])  CKD (chronic kidney disease), stage III (HCC)    Diabetes ([place])    type 2   Hypertension    Morbid obesity (HCC)     Social History   Socioeconomic History   Marital status: Single    Spouse name: Not on file   Number of children: 0   Years of education: Not on file   Highest education level: Not on file  Occupational History   Occupation: works at [employer] 6   Tobacco Use   Smoking status: Former    Pack years: 0.00    Types: Cigars   Smokeless tobacco: Never  Scientific laboratory technician Use: Never used  Substance and Sexual Activity   Alcohol use: No    Alcohol/week: 0.0 standard drinks   Drug use: No   Sexual activity: Yes    Birth control/protection: None  Other Topics Concern   Not on file  Social History Narrative   Lives with mother    Social Determinants of Health   Financial Resource Strain: Not on file  Food Insecurity: Not on file  Transportation Needs: Not  on file  Physical Activity: Not on file  Stress: Not on file  Social Connections: Not on file  Intimate Partner Violence: Not on file    Past Surgical History:  Procedure Laterality Date   A/V FISTULAGRAM Left 10/29/2020   Procedure: A/V FISTULAGRAM;  Surgeon: Algernon Huxley, MD;  Location: [place] [facility];  Service: Cardiovascular;  Laterality: Left;   APPLICATION OF WOUND VAC Bilateral 09/15/2019   Procedure: APPLICATION OF WOUND VAC;  Surgeon: Benjamine Sprague, DO;  Location: [facility];  Service: General;  Laterality: Bilateral;   AV FISTULA PLACEMENT Left 07/09/2020   Procedure: ARTERIOVENOUS (AV) FISTULA CREATION ( BRACHIAL CEPHALIC);  Surgeon: Algernon Huxley, MD;  Location: [facility];  Service: Vascular;  Laterality: Left;   DIALYSIS/PERMA CATHETER INSERTION N/A 02/28/2019   Procedure: DIALYSIS/PERMA CATHETER INSERTION;  Surgeon: Algernon Huxley, MD;  Location: [place] [facility];  Service: Cardiovascular;  Laterality: N/A;   DIALYSIS/PERMA CATHETER INSERTION N/A 09/17/2019   Procedure: DIALYSIS/PERMA CATHETER EXCHANGE;  Surgeon: Katha Cabal, MD;  Location: [place] [facility];  Service: Cardiovascular;  Laterality: N/A;   DIALYSIS/PERMA CATHETER REMOVAL N/A 03/18/2020   Procedure: DIALYSIS/PERMA CATHETER REMOVAL;  Surgeon: Algernon Huxley, MD;  Location: [place] [facility];  Service: Cardiovascular;  Laterality: N/A;   LOWER EXTREMITY ANGIOGRAPHY Left 12/02/2020   Procedure: LOWER EXTREMITY ANGIOGRAPHY;  Surgeon: Algernon Huxley, MD;  Location: [place] [facility];  Service: Cardiovascular;  Laterality: Left;   LOWER EXTREMITY ANGIOGRAPHY Right 01/25/2021   Procedure: LOWER EXTREMITY ANGIOGRAPHY;  Surgeon: Algernon Huxley, MD;  Location: [place] [facility];  Service: Cardiovascular;  Laterality: Right;   pd cath  05/21/2019   TONSILLECTOMY     WOUND DEBRIDEMENT Bilateral 09/15/2019   Procedure: DEBRIDEMENT WOUND;  Surgeon: Benjamine Sprague, DO;  Location: [facility];  Service: General;   Laterality: Bilateral;    Family History  Problem Relation Age of Onset   Hypertension Mother    Multiple sclerosis Mother    CAD Mother    Cancer Maternal Grandmother        Ovarian    Cancer Maternal Grandfather        Prostate    Kidney disease Father     Allergies  Allergen Reactions   Tomato (Diagnostic) Itching    CBC Latest Ref Rng & Units 09/19/2020 07/09/2020 06/18/2020  WBC  4.0 - 10.5 K/uL 15.1(H) - 16.2(H)  Hemoglobin 12.0 - 15.0 g/dL 11.4(L) 13.9 11.8(L)  Hematocrit 36.0 - 46.0 % 34.3(L) 41.0 36.3  Platelets 150 - 400 K/uL 250 - 303      CMP     Component Value Date/Time   NA 139 09/19/2020 1505   NA 135 (L) 12/31/2013 1020   K 3.2 (L) 09/19/2020 1505   K 3.9 12/31/2013 1020   CL 95 (L) 09/19/2020 1505   CL 105 12/31/2013 1020   CO2 23 09/19/2020 1505   CO2 26 12/31/2013 1020   GLUCOSE 121 (H) 09/19/2020 1505   GLUCOSE 243 (H) 12/31/2013 1020   BUN 58 (H) 09/19/2020 1505   BUN 19 (H) 12/31/2013 1020   CREATININE 12.28 (H) 09/19/2020 1505   CREATININE 2.12 (H) 12/31/2013 1020   CALCIUM 7.6 (L) 09/19/2020 1505   CALCIUM 8.5 12/31/2013 1020   PROT 8.0 09/19/2020 1505   PROT 7.4 12/31/2013 1020   ALBUMIN 3.2 (L) 09/19/2020 1505   ALBUMIN 3.1 (L) 12/31/2013 1020   AST 11 (L) 09/19/2020 1505   AST 21 12/31/2013 1020   ALT 7 09/19/2020 1505   ALT 26 12/31/2013 1020   ALKPHOS 112 09/19/2020 1505   ALKPHOS 98 12/31/2013 1020   BILITOT 0.7 09/19/2020 1505   BILITOT 0.4 12/31/2013 1020   GFRNONAA 4 (L) 09/19/2020 1505   GFRNONAA 31 (L) 12/31/2013 1020   GFRAA 5 (L) 06/18/2020 1217   GFRAA 36 (L) 12/31/2013 1020     VAS [place] ABI WITH/WO TBI  Result Date: 02/26/2021  LOWER EXTREMITY DOPPLER STUDY Patient Name:  [name]  Date of Exam:   02/22/2021 Medical Rec #: [id number]          Accession #:    [id number] Date of Birth: 24-Oct-1985          Patient Gender: F Patient Age:   75Y Exam Location:  [facility] Procedure:      VAS [place] ABI  WITH/WO TBI Referring Phys: [id number] JASON S DEW --------------------------------------------------------------------------------  Indications: Peripheral artery disease.  Vascular Interventions: 12/02/2020 left pta's of calf vessels; 01/25/2021 right                         pta of calf vessels. Comparison Study: 10/21/2020 Performing Technologist: Concha Norway RVT  Examination Guidelines: A complete evaluation includes at minimum, Doppler waveform signals and systolic blood pressure reading at the level of bilateral brachial, anterior tibial, and posterior tibial arteries, when vessel segments are accessible. Bilateral testing is considered an integral part of a complete examination. Photoelectric Plethysmograph (PPG) waveforms and toe systolic pressure readings are included as required and additional duplex testing as needed. Limited examinations for reoccurring indications may be performed as noted.  ABI Findings: +---------+------------------+-----+---------+--------+ Right    Rt Pressure (mmHg)IndexWaveform Comment  +---------+------------------+-----+---------+--------+ Brachial 136                                      +---------+------------------+-----+---------+--------+ ATA      148               1.09 triphasic         +---------+------------------+-----+---------+--------+ PTA      158               1.16 triphasic         +---------+------------------+-----+---------+--------+ Corky Downs  0.60 Abnormal          +---------+------------------+-----+---------+--------+ +---------+------------------+-----+--------+-------+ Left     Lt Pressure (mmHg)IndexWaveformComment +---------+------------------+-----+--------+-------+ ATA      162               1.19 biphasic        +---------+------------------+-----+--------+-------+ PTA      169               1.24 biphasic        +---------+------------------+-----+--------+-------+ Great Toe50                 0.37 Abnormal        +---------+------------------+-----+--------+-------+ +-------+-----------+-----------+------------+------------+ ABI/TBIToday's ABIToday's TBIPrevious ABIPrevious TBI +-------+-----------+-----------+------------+------------+ Right  1.16       .60        1.27        .41          +-------+-----------+-----------+------------+------------+ Left   1.24       .37        1.24        .44          +-------+-----------+-----------+------------+------------+ Right TBIs appear increased compared to prior study on 10/27/2020. Left TBIs appear essentially unchanged.  Summary: Right: Resting right ankle-brachial index is within normal range. No evidence of significant right lower extremity arterial disease. The right toe-brachial index is abnormal. Left: Resting left ankle-brachial index is within normal range. No evidence of significant left lower extremity arterial disease. The left toe-brachial index is abnormal.  *See table(s) above for measurements and observations.  Electronically signed by [name] MD on 02/26/2021 at 12:28:36 PM.    Final        Assessment & Plan:   1. 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  This represents a limb threatening ischemia and places the patient at the risk for limb loss.  Patient should undergo angiography of the lower extremities with the hope for intervention for limb salvage.  The risks and benefits as well as the alternative therapies was discussed in detail with the patient.  All questions were answered.  Patient agrees to proceed with angiography.  The patient will follow up with me in the office after the procedure.       2. Open wound of finger of  left hand, sequela Today noninvasive studies show adequate flow volume and no evidence of steal syndrome however a lack of radial pulse as well as ulceration on her fingertip, are causing for angiogram.  There is certainly instances to wear still syndrome is present that is not detected on ultrasound.  It is also possible that this is more related to atherosclerotic changes that also affects her lower extremities.  The risk, benefits and alternatives were discussed and the patient does not agree to proceed with angiogram of the left upper extremity.  Because the patient is on dialysis this will be done concurrently with her lower extremity angiogram.  3. Essential hypertension Continue antihypertensive medications as already ordered, these medications have been reviewed and there are no changes at this time.    Current Outpatient Medications on File Prior to Visit  Medication Sig Dispense Refill   albuterol (VENTOLIN HFA) 108 (90 Base) MCG/ACT inhaler Inhale into the lungs every 6 (six) hours as needed.  amLODipine (NORVASC) 10 MG tablet Take 10 mg by mouth daily.     amoxicillin-clavulanate (AUGMENTIN) 875-125 MG tablet Take 1 tablet daily and then 1 tablet extra after each dialysis session 20 tablet 0   aspirin EC 81 MG EC tablet Take 1 tablet (81 mg total) by mouth daily. 30 tablet 2   atorvastatin (LIPITOR) 20 MG tablet Take 20 mg by mouth daily.     calcium acetate (PHOSLO) 667 MG capsule Take 2.0 Capsule Oral three times a day with meal     calcium citrate (CALCITRATE - DOSED IN MG ELEMENTAL CALCIUM) 950 (200 Ca) MG tablet Take 200 mg of elemental calcium by mouth 3 (three) times daily.     carvedilol (COREG) 25 MG tablet Take 1 tablet (25 mg total) by mouth 2 (two) times daily with a meal. 60 tablet 5   Cholecalciferol (VITAMIN D) 50 MCG (2000 UT) CAPS Take by mouth daily.     cloNIDine (CATAPRES) 0.2 MG tablet Take 0.2 mg by mouth 3 (three) times daily.     clopidogrel (PLAVIX) 75 MG  tablet Take 1 tablet (75 mg total) by mouth daily. 30 tablet 11   ferric citrate (AURYXIA) 1 GM 210 MG(Fe) tablet Take 420 mg by mouth 3 (three) times daily.     ferrous gluconate (FERGON) 324 MG tablet Take 324 mg by mouth daily.     gentamicin cream (GARAMYCIN) 0.1 % APPLY TO EXIT SITE DAILY     HYDROcodone-acetaminophen (NORCO/VICODIN) 5-325 MG tablet Take one to two tablets every six to eight hours as needed for pain. 20 tablet 0   isosorbide mononitrate (IMDUR) 30 MG 24 hr tablet Take 1 tablet (30 mg total) by mouth 2 (two) times daily. 60 tablet 2   KLOR-CON M20 20 MEQ tablet Take 20 mEq by mouth daily.     losartan (COZAAR) 100 MG tablet      losartan (COZAAR) 50 MG tablet Take 50 mg by mouth daily.     multivitamin (RENA-VIT) TABS tablet Take 1 tablet by mouth at bedtime. 30 tablet 0   mupirocin ointment (BACTROBAN) 2 % Apply 1 application topically 2 (two) times daily. 30 g 2   sodium thiosulfate 25 % injection Inject 12.5 g into the vein once.     torsemide (DEMADEX) 100 MG tablet Take 100 mg by mouth every morning.     vitamin C (VITAMIN C) 500 MG tablet Take 1 tablet (500 mg total) by mouth 2 (two) times daily. 60 tablet 1   nitroGLYCERIN (NITROGLYN) 2 % OINT ointment Apply 1 application topically every 12 (twelve) hours. Applied just behind the toes on the left and right foot as needed 60 g 0   No current facility-administered medications on file prior to visit.    There are no Patient Instructions on file for this visit. No follow-ups on file.   Kris Hartmann, NP

## 2021-04-08 ENCOUNTER — Other Ambulatory Visit (INDEPENDENT_AMBULATORY_CARE_PROVIDER_SITE_OTHER): Payer: Self-pay | Admitting: Nurse Practitioner

## 2021-04-08 NOTE — Telephone Encounter (Signed)
Patient called to reschedule her LLE angio and LUE angio with Dr. [name] scheduled for today due to sickness. Patient has been rescheduled to 04/15/21 with a 8:00 am arrival time to the [facility]. Pre-procedure instructions will be mailed.

## 2021-04-15 ENCOUNTER — Encounter
Admission: RE | Disposition: A | Discharge status: Home / Self Care | Payer: Self-pay | Source: Home / Self Care | Attending: Vascular Surgery

## 2021-04-15 ENCOUNTER — Other Ambulatory Visit: Payer: Self-pay

## 2021-04-15 ENCOUNTER — Encounter: Payer: Self-pay | Admitting: Vascular Surgery

## 2021-04-15 ENCOUNTER — Ambulatory Visit
Admission: RE | Admit: 2021-04-15 | Discharge: 2021-04-15 | Disposition: A | Discharge status: Home / Self Care | Payer: Medicare Other | Attending: Vascular Surgery | Admitting: Vascular Surgery

## 2021-04-15 DIAGNOSIS — T82898A Other specified complication of vascular prosthetic devices, implants and grafts, initial encounter: Secondary | ICD-10-CM | POA: Diagnosis not present

## 2021-04-15 DIAGNOSIS — Z7982 Long term (current) use of aspirin: Secondary | ICD-10-CM | POA: Insufficient documentation

## 2021-04-15 DIAGNOSIS — Z79899 Other long term (current) drug therapy: Secondary | ICD-10-CM | POA: Insufficient documentation

## 2021-04-15 DIAGNOSIS — N186 End stage renal disease: Secondary | ICD-10-CM | POA: Diagnosis not present

## 2021-04-15 DIAGNOSIS — Z87891 Personal history of nicotine dependence: Secondary | ICD-10-CM | POA: Diagnosis not present

## 2021-04-15 DIAGNOSIS — Z7902 Long term (current) use of antithrombotics/antiplatelets: Secondary | ICD-10-CM | POA: Insufficient documentation

## 2021-04-15 DIAGNOSIS — E1122 Type 2 diabetes mellitus with diabetic chronic kidney disease: Secondary | ICD-10-CM | POA: Diagnosis not present

## 2021-04-15 DIAGNOSIS — I70245 Atherosclerosis of native arteries of left leg with ulceration of other part of foot: Secondary | ICD-10-CM | POA: Insufficient documentation

## 2021-04-15 DIAGNOSIS — S61209S Unspecified open wound of unspecified finger without damage to nail, sequela: Secondary | ICD-10-CM | POA: Insufficient documentation

## 2021-04-15 DIAGNOSIS — Z91018 Allergy to other foods: Secondary | ICD-10-CM | POA: Diagnosis not present

## 2021-04-15 DIAGNOSIS — I132 Hypertensive heart and chronic kidney disease with heart failure and with stage 5 chronic kidney disease, or end stage renal disease: Secondary | ICD-10-CM | POA: Diagnosis not present

## 2021-04-15 DIAGNOSIS — X58XXXS Exposure to other specified factors, sequela: Secondary | ICD-10-CM | POA: Insufficient documentation

## 2021-04-15 DIAGNOSIS — I509 Heart failure, unspecified: Secondary | ICD-10-CM | POA: Diagnosis not present

## 2021-04-15 DIAGNOSIS — E11621 Type 2 diabetes mellitus with foot ulcer: Secondary | ICD-10-CM | POA: Diagnosis not present

## 2021-04-15 DIAGNOSIS — Y832 Surgical operation with anastomosis, bypass or graft as the cause of abnormal reaction of the patient, or of later complication, without mention of misadventure at the time of the procedure: Secondary | ICD-10-CM | POA: Diagnosis not present

## 2021-04-15 DIAGNOSIS — L97529 Non-pressure chronic ulcer of other part of left foot with unspecified severity: Secondary | ICD-10-CM | POA: Diagnosis not present

## 2021-04-15 DIAGNOSIS — E1151 Type 2 diabetes mellitus with diabetic peripheral angiopathy without gangrene: Secondary | ICD-10-CM | POA: Insufficient documentation

## 2021-04-15 DIAGNOSIS — I70219 Atherosclerosis of native arteries of extremities with intermittent claudication, unspecified extremity: Secondary | ICD-10-CM

## 2021-04-15 HISTORY — PX: UPPER EXTREMITY ANGIOGRAPHY: CATH118270

## 2021-04-15 HISTORY — PX: LOWER EXTREMITY ANGIOGRAPHY: CATH118251

## 2021-04-15 LAB — GLUCOSE, CAPILLARY
Glucose-Capillary: 130 mg/dL — ABNORMAL HIGH (ref 70–99)
Glucose-Capillary: 136 mg/dL — ABNORMAL HIGH (ref 70–99)

## 2021-04-15 LAB — POTASSIUM (ARMC VASCULAR LAB ONLY): Potassium (ARMC vascular lab): 3.2 — ABNORMAL LOW (ref 3.5–5.1)

## 2021-04-15 SURGERY — LOWER EXTREMITY ANGIOGRAPHY
Anesthesia: Moderate Sedation | Site: Leg Lower | Laterality: Left

## 2021-04-15 MED ORDER — METHYLPREDNISOLONE SODIUM SUCC 125 MG IJ SOLR: 125.0000 mg | Freq: Once | INTRAMUSCULAR | Status: DC | PRN

## 2021-04-15 MED ORDER — NITROGLYCERIN 1 MG/10 ML FOR IR/CATH LAB
INTRA_ARTERIAL | Status: AC
  Filled 2021-04-15: qty 10

## 2021-04-15 MED ORDER — SODIUM CHLORIDE 0.9 % IV SOLN
1.0000 g | Freq: Once | INTRAVENOUS | Status: AC
  Administered 2021-04-15: 1 g via INTRAVENOUS

## 2021-04-15 MED ORDER — HEPARIN SODIUM (PORCINE) 1000 UNIT/ML IJ SOLN
INTRAMUSCULAR | Status: DC | PRN
  Administered 2021-04-15: 5000 [IU] via INTRAVENOUS

## 2021-04-15 MED ORDER — HYDROMORPHONE HCL 1 MG/ML IJ SOLN
1.0000 mg | Freq: Once | INTRAMUSCULAR | Status: DC | PRN
Start: 2021-04-15 — End: 2021-04-15

## 2021-04-15 MED ORDER — FENTANYL CITRATE (PF) 100 MCG/2ML IJ SOLN
INTRAMUSCULAR | Status: DC | PRN
  Administered 2021-04-15 (×3): 12.5 ug via INTRAVENOUS
  Administered 2021-04-15: 50 ug via INTRAVENOUS
  Administered 2021-04-15: 12.5 ug via INTRAVENOUS

## 2021-04-15 MED ORDER — MIDAZOLAM HCL 2 MG/ML PO SYRP: 8.0000 mg | ORAL_SOLUTION | Freq: Once | ORAL | Status: DC | PRN

## 2021-04-15 MED ORDER — SODIUM CHLORIDE 0.9 % IV SOLN: INTRAVENOUS | Status: DC

## 2021-04-15 MED ORDER — MIDAZOLAM HCL 5 MG/5ML IJ SOLN
INTRAMUSCULAR | Status: AC
  Filled 2021-04-15: qty 5

## 2021-04-15 MED ORDER — MIDAZOLAM HCL 2 MG/2ML IJ SOLN
INTRAMUSCULAR | Status: DC | PRN
  Administered 2021-04-15 (×2): 0.5 mg via INTRAVENOUS
  Administered 2021-04-15 (×2): 1 mg via INTRAVENOUS
  Administered 2021-04-15: 2 mg via INTRAVENOUS

## 2021-04-15 MED ORDER — HEPARIN SODIUM (PORCINE) 1000 UNIT/ML IJ SOLN
INTRAMUSCULAR | Status: AC
  Filled 2021-04-15: qty 1

## 2021-04-15 MED ORDER — DIPHENHYDRAMINE HCL 50 MG/ML IJ SOLN: 50.0000 mg | Freq: Once | INTRAMUSCULAR | Status: DC | PRN

## 2021-04-15 MED ORDER — IODIXANOL 320 MG/ML IV SOLN
INTRAVENOUS | Status: DC | PRN
  Administered 2021-04-15: 50 mL via INTRA_ARTERIAL

## 2021-04-15 MED ORDER — ONDANSETRON HCL 4 MG/2ML IJ SOLN: 4.0000 mg | Freq: Four times a day (QID) | INTRAMUSCULAR | Status: DC | PRN

## 2021-04-15 MED ORDER — NITROGLYCERIN 1 MG/10 ML FOR IR/CATH LAB
INTRA_ARTERIAL | Status: DC | PRN
  Administered 2021-04-15: 200 ug

## 2021-04-15 MED ORDER — FAMOTIDINE 20 MG PO TABS: 40.0000 mg | ORAL_TABLET | Freq: Once | ORAL | Status: DC | PRN

## 2021-04-15 MED ORDER — FENTANYL CITRATE (PF) 100 MCG/2ML IJ SOLN
INTRAMUSCULAR | Status: AC
  Filled 2021-04-15: qty 2

## 2021-04-15 SURGICAL SUPPLY — 25 items
BALLN LUTONIX 018 5X100X130 (BALLOONS) ×4
BALLN LUTONIX 018 5X40X130 (BALLOONS) ×4
BALLN ULTRVRSE 2.5X300X150 (BALLOONS) ×4
BALLN ULTRVRSE 2X220X150 (BALLOONS) ×4
BALLOON LUTONIX 018 5X100X130 (BALLOONS) ×2 IMPLANT
BALLOON LUTONIX 018 5X40X130 (BALLOONS) ×2 IMPLANT
BALLOON ULTRVRSE 2.5X300X150 (BALLOONS) ×2 IMPLANT
BALLOON ULTRVRSE 2X220X150 (BALLOONS) ×2 IMPLANT
CATH ANGIO 5F PIGTAIL 100CM (CATHETERS) ×4 IMPLANT
CATH BEACON 5 .035 100 H1 TIP (CATHETERS) ×4 IMPLANT
CATH NAVICROSS ANGLED 135CM (MICROCATHETER) ×4 IMPLANT
COVER PROBE U/S 5X48 (MISCELLANEOUS) ×4 IMPLANT
DEVICE STARCLOSE SE CLOSURE (Vascular Products) ×4 IMPLANT
DEVICE TORQUE (MISCELLANEOUS) ×4 IMPLANT
GLIDEWIRE ADV .035X260CM (WIRE) ×4 IMPLANT
GLIDEWIRE ANGLED SS 035X260CM (WIRE) ×4 IMPLANT
KIT ENCORE 26 ADVANTAGE (KITS) ×4 IMPLANT
PACK ANGIOGRAPHY (CUSTOM PROCEDURE TRAY) ×4 IMPLANT
SHEATH BRITE TIP 5FRX11 (SHEATH) ×4 IMPLANT
SHEATH RAABE 6FRX70 (SHEATH) ×4 IMPLANT
STENT VIABAHN 6X25X120 (Permanent Stent) ×4 IMPLANT
SYR MEDRAD MARK 7 150ML (SYRINGE) ×4 IMPLANT
TUBING CONTRAST HIGH PRESS 72 (TUBING) ×4 IMPLANT
WIRE G V18X300CM (WIRE) ×8 IMPLANT
WIRE GUIDERIGHT .035X150 (WIRE) ×4 IMPLANT

## 2021-04-15 NOTE — Interval H&P Note (Signed)
History and Physical Interval Note:  04/15/2021 8:05 AM  [name]  has presented today for surgery, with the diagnosis of LT LE Angio and LT upper exttremity angio   BARD   ASO w claudication.  The various methods of treatment have been discussed with the patient and family. After consideration of risks, benefits and other options for treatment, the patient has consented to  Procedure(s): LOWER EXTREMITY ANGIOGRAPHY (Left) UPPER EXTREMITY ANGIOGRAPHY (Left) as a surgical intervention.  The patient's history has been reviewed, patient examined, no change in status, stable for surgery.  I have reviewed the patient's chart and labs.  Questions were answered to the patient's satisfaction.     [name]

## 2021-04-15 NOTE — Op Note (Signed)
[facility]  Percutaneous Study/Intervention Procedural Note      Date of Surgery: 04/15/2021  Surgeon(s):Margreat Widener    Assistants:none  EBL: 10 cc  Contrast: 50 cc (for both procedure)  Fluoro Time: 12.2 minutes (for both procedures)  Moderate Conscious Sedation Time: approximately 85 minutes using 5 mg of Versed and 100 mcg of Fentanyl (for both procedures)   PREOPERATIVE DIAGNOSIS: 1. End-stage renal disease. 2. Steal syndrome, left arm with patent left brachiocephalic.   POSTOPERATIVE DIAGNOSIS: Same as above   PROCEDURE PERFORMED: 1. Ultrasound guidance vascular access to right femoral artery. 2. Catheter placement to left radial artery and left ulnar arteries     from right femoral approach. 3. Thoracic aortogram and selective left upper extremity angiogram     including selective images of the radial and ulnar arteries. 4.  Percutaneous transluminal angioplasty of left radial artery with 2 mm diameter by 22 cm length angioplasty balloon with      INDICATION FOR PROCEDURE:  This is a 35 y.o.female who presented to our office with steal syndrome.  The patient's left brachiocephalic AV fistula is working well, but their hand is numb and painful.  She has nonhealing ulcerations on the left hand as well.  To further evaluate this to determine what options would be possible to treat the steal syndrome, angiogram of the left upper extremity is indicated.  Risks and benefits are discussed.  Informed consent was obtained.   DESCRIPTION OF PROCEDURE:  The patient was brought to the vascular suite.  Moderate conscious sedation was administered during a face to face encounter with the patient throughout the procedure with my supervision of the RN administering medicines and monitoring the patient's vital signs, pulse oximetry, telemetry and mental status throughout from the start of the procedure until the patient was taken to the recovery room.  Groins  were shaved and prepped and sterile surgical field was created.  The right femoral head was localized with fluoroscopy and the right femoral artery was then visualized with ultrasound and found to be widely patent.  It was then accessed under direct ultrasound guidance without difficulty with a Seldinger needle and a permanent image was recorded.  A J-wire and 5-French sheath were then placed.  Pigtail catheter was placed into the ascending aorta and a thoracic aortogram was then performed in the LAO projection. This demonstrated normal origins to the great vessels without significant proximal stenoses and a normal configuration of the great vessels.  The patient was given 5000 units of intravenous heparin and a headhunter catheter was used to selectively cannulate the left subclavian artery without difficulty.  This was then sequentially advanced to the brachial artery and to the brachial bifurcation.  Steal was demonstrated with all of the flow from the artery going into the fistula and not downstream to the hand on images with the catheter proximal to the access.  There were no obvious proximal stenoses in the left subclavian, axillary, or brachial artery prior to the access.  I then advanced a Ferd Hibbs cross catheter beyond the access and into the radial and ulnar arteries.  The radial artery was found to be quite diseased with a greater than 80% stenosis in the mid to distal segment, and then a greater than 80% stenosis at the wrist and in the proximal hand.  The Ferd Hibbs cross catheter was then used to cannulate the ulnar artery.  The ulnar artery was occluded after the takeoff of the interosseous artery which was actually the  best runoff distally.  There is not a good distal reconstitution of the ulnar artery.  I then recannulated the radial artery and advanced a V 18 wire all the way into the palmar arch in the hand.  A 2 mm diameter by 22 cm length angioplasty balloon was then inflated up to 12 atm for 1  minute.  Completion imaging showed significant spasm was treated with intra-arterial nitroglycerin with some improvement but it was not clear whether or not there may be a dissection.  I then reinflated the 2 mm diameter by 22 cm length angioplasty balloon up to 8 atm for a minute and gave additional intra-arterial nitroglycerin with what appeared to be improvement. I then turned my attention to the lower extremity portion of the procedure as dictated below.   Pre-operative Diagnosis: PAD with ulceration left lower extremity  Post-operative diagnosis:  Same  Procedure(s) Performed:             1.  Ultrasound guidance for vascular access right femoral artery             2.  Catheter placement into left common femoral artery from right femoral approach             3.  Aortogram and selective left lower extremity angiogram             4.  Percutaneous transluminal angioplasty of left posterior tibial artery with 2.5 mm diameter by 30 cm length angioplasty balloon             5.  Percutaneous transluminal angioplasty of the left popliteal artery with 5 mm diameter by 10 cm length Lutonix drug-coated angioplasty balloon  6.  Viabahn stent placement to the left popliteal artery with 6 mm diameter by 2.5 cm length stent for residual stenosis after angioplasty             7.  StarClose closure device right femoral artery                Indications:  Patient is a 35 y.o.female with severe peripheral arterial disease and nonhealing ulceration on the left foot. The patient has noninvasive study showing reduced perfusion in the left foot. The patient is brought in for angiography for further evaluation and potential treatment.  Due to the limb threatening nature of the situation, angiogram was performed for attempted limb salvage. The patient is aware that if the procedure fails, amputation would be expected.  The patient also understands that even with successful revascularization, amputation may still be  required due to the severity of the situation.  Risks and benefits are discussed and informed consent is obtained.   Procedure:  The patient was identified and appropriate procedural time out was performed.  The patient was then placed supine on the table and prepped and draped in the usual sterile fashion. Moderate conscious sedation was administered during a face to face encounter with the patient throughout the procedure with my supervision of the RN administering medicines and monitoring the patient's vital signs, pulse oximetry, telemetry and mental status throughout from the start of the procedure until the patient was taken to the recovery room. Ultrasound was used to evaluate the right common femoral artery.  It was patent .  A digital ultrasound image was acquired.  A Seldinger needle was used to access the right common femoral artery under direct ultrasound guidance and a permanent image was performed.  A 0.035 J wire was advanced without resistance and a 5Fr sheath  was placed.  Pigtail catheter was placed into the aorta and an AP aortogram was performed. This demonstrated normal renal arteries and normal aorta and iliac segments without significant stenosis. I then crossed the aortic bifurcation and advanced to the left femoral head. Selective left lower extremity angiogram was then performed. This demonstrated Calcified but not stenotic common femoral artery, profunda femoris artery, and superficial femoral artery.  The popliteal artery was normal in the proximal segment but in the mid to distal segment there was 70 to 75% stenosis below the knee.  There was then a typical tibial trifurcation but severe disease of all 3 tibial vessels.  The anterior tibial artery occluded about 5 to 10 cm beyond its takeoff without distal reconstitution.  The peroneal artery was small and appeared to occlude in the midsegment.  There was not good distal reconstitution.  The posterior tibial artery had multiple areas of  occlusion but had the only distal reconstitution in the foot and lower leg. It was felt that it was in the patient's best interest to proceed with intervention after these images to avoid a second procedure and a larger amount of contrast and fluoroscopy based off of the findings from the initial angiogram. The patient was systemically heparinized and a 6 French 70 cm sheath was then placed over the Terumo Advantage wire. I then used a Ferd Hibbs cross catheter and the advantage wire to navigate down across the popliteal artery stenosis and into the posterior tibial artery.  I then exchanged for a V 18 wire which was able to cross the posterior tibial lesions and get all the way into the mid to distal foot.  I then advanced a 2.5 mm diameter by 30 cm length angioplasty balloon from the proximal foot which as far as it would pass in the posterior tibial artery up to the proximal to mid segment above the areas of disease.  This was inflated to 8 atm for 1 minute.  Completion imaging showed marked improvement with less than 25% residual stenosis in the posterior tibial artery all the way down to the foot although there was still significant small vessel disease.  The popliteal artery was addressed with a 5 mm diameter by 10 cm length Lutonix drug-coated angioplasty balloon inflated to 10 atm for 1 minute.  Completion imaging showed a short segment of residual narrowing of greater than 50% so I elected to cover this area with a 6 mm diameter by 2.5 cm length Viabahn stent.  This is postdilated with a 5 mm diameter Lutonix drug-coated balloon with excellent angiographic completion result and less than 10% residual stenosis. I elected to terminate the procedure. The sheath was removed and StarClose closure device was deployed in the right femoral artery with excellent hemostatic result. The patient was taken to the recovery room in stable condition having tolerated the procedure well.  Findings:               Aortogram:   This demonstrated normal renal arteries and normal aorta and iliac segments without significant stenosis.             Left lower Extremity: Calcified but not stenotic common femoral artery, profunda femoris artery, and superficial femoral artery.  The popliteal artery was normal in the proximal segment but in the mid to distal segment there was 70 to 75% stenosis below the knee.  There was then a typical tibial trifurcation but severe disease of all 3 tibial vessels.  The anterior tibial artery occluded about  5 to 10 cm beyond its takeoff without distal reconstitution.  The peroneal artery was small and appeared to occlude in the midsegment.  There was not good distal reconstitution.  The posterior tibial artery had multiple areas of occlusion but had the only distal reconstitution in the foot and lower leg.        [name] 04/15/2021 11:12 AM

## 2021-04-21 ENCOUNTER — Other Ambulatory Visit: Payer: Self-pay

## 2021-04-21 ENCOUNTER — Ambulatory Visit (INDEPENDENT_AMBULATORY_CARE_PROVIDER_SITE_OTHER): Payer: Medicare Other | Admitting: Podiatry

## 2021-04-21 DIAGNOSIS — I739 Peripheral vascular disease, unspecified: Secondary | ICD-10-CM | POA: Diagnosis not present

## 2021-04-21 DIAGNOSIS — I96 Gangrene, not elsewhere classified: Secondary | ICD-10-CM

## 2021-04-21 DIAGNOSIS — L97522 Non-pressure chronic ulcer of other part of left foot with fat layer exposed: Secondary | ICD-10-CM

## 2021-04-21 DIAGNOSIS — N186 End stage renal disease: Secondary | ICD-10-CM

## 2021-04-21 DIAGNOSIS — Z992 Dependence on renal dialysis: Secondary | ICD-10-CM

## 2021-04-25 ENCOUNTER — Encounter: Payer: Self-pay | Admitting: Podiatry

## 2021-04-25 NOTE — Progress Notes (Signed)
  Subjective:  Patient ID: [name], female    DOB: 11/01/1985,  MRN: [id number]  Follow-up ulcers and toe amputation   DOS: 01/01/21 Procedure: amputation L 5th toe  35 y.o. female returns for post-op check.  She underwent angioplasty and stenting with Dr. [name] last week  Review of Systems: Negative except as noted in the HPI. Denies N/V/F/Ch.   Objective:   There were no vitals filed for this visit. There is no height or weight on file to calculate BMI. Constitutional Well developed. Well nourished.  Vascular  left foot is warm well perfused.  The right foot is cool to touch  Neurologic Normal speech. Oriented to person, place, and time. Epicritic sensation to light touch grossly decreased bilaterally.  Dermatologic Left foot fifth toe dehiscence still present, she has new gangrenous changes on the fourth toe this last time I saw her.  The right foot fifth toe remains gangrenous as well  Orthopedic: Tenderness to palpation noted about the surgical site.   Reviewed her angiography she does not have inline flow through the PTA but unclear how much of this is reaching the forefoot   Radiographs: s/p amputation left 5th toe at [facility]  Wound culture with moderate growth of corynebacterium Assessment:   1. Ulcer of left foot with fat layer exposed ([place])   2. Gangrene of toe of right foot ([place])   3. Peripheral arterial disease ([place])   4. ESRD (end stage renal disease) on dialysis [facility])     Plan:  Patient was evaluated and treated and all questions answered.  S/p foot surgery left -Continue local wound care with Prisma on the left foot and mupirocin (Rx sent to pharm) on the right foot.    We will have to watch the remaining areas of gangrene on both sides, if she begins to develop more gangrenous changes she may be at risk of needing a transmetatarsal amputation.  We will discuss this with Dr. [name].  She remains at high risk of limb loss bilaterally       Return in  about 3 weeks (around 05/12/2021) for wound care.

## 2021-05-04 ENCOUNTER — Other Ambulatory Visit (INDEPENDENT_AMBULATORY_CARE_PROVIDER_SITE_OTHER): Payer: Self-pay | Admitting: Vascular Surgery

## 2021-05-04 DIAGNOSIS — Z95820 Peripheral vascular angioplasty status with implants and grafts: Secondary | ICD-10-CM

## 2021-05-04 DIAGNOSIS — I70249 Atherosclerosis of native arteries of left leg with ulceration of unspecified site: Secondary | ICD-10-CM

## 2021-05-07 ENCOUNTER — Telehealth (INDEPENDENT_AMBULATORY_CARE_PROVIDER_SITE_OTHER): Payer: Self-pay

## 2021-05-07 ENCOUNTER — Other Ambulatory Visit: Payer: Self-pay

## 2021-05-07 ENCOUNTER — Ambulatory Visit (INDEPENDENT_AMBULATORY_CARE_PROVIDER_SITE_OTHER): Payer: Medicare Other | Admitting: Nurse Practitioner

## 2021-05-07 ENCOUNTER — Ambulatory Visit (INDEPENDENT_AMBULATORY_CARE_PROVIDER_SITE_OTHER): Payer: Medicare Other

## 2021-05-07 VITALS — BP 145/86 | HR 97 | Ht 67.0 in | Wt 301.0 lb

## 2021-05-07 DIAGNOSIS — I70249 Atherosclerosis of native arteries of left leg with ulceration of unspecified site: Secondary | ICD-10-CM | POA: Diagnosis not present

## 2021-05-07 DIAGNOSIS — I10 Essential (primary) hypertension: Secondary | ICD-10-CM

## 2021-05-07 DIAGNOSIS — Z95820 Peripheral vascular angioplasty status with implants and grafts: Secondary | ICD-10-CM

## 2021-05-07 DIAGNOSIS — S61209S Unspecified open wound of unspecified finger without damage to nail, sequela: Secondary | ICD-10-CM

## 2021-05-07 MED ORDER — CILOSTAZOL 100 MG PO TABS: 100.0000 mg | ORAL_TABLET | Freq: Two times a day (BID) | ORAL | 11 refills | Status: AC

## 2021-05-07 NOTE — Telephone Encounter (Signed)
Spoke with the patient and she is scheduled with Dr. [name] for a RLE angio on 05/20/21 with a 6:45 am arrival time and LLE angio on 05/27/21 wit a 6:45 am arrival time to the [facility]. Pre-procedure instructions were discussed and will be mailed.

## 2021-05-12 ENCOUNTER — Ambulatory Visit: Payer: Medicare Other | Admitting: Podiatry

## 2021-05-14 ENCOUNTER — Ambulatory Visit (INDEPENDENT_AMBULATORY_CARE_PROVIDER_SITE_OTHER): Payer: Medicare Other | Admitting: Podiatry

## 2021-05-14 ENCOUNTER — Other Ambulatory Visit: Payer: Self-pay

## 2021-05-14 DIAGNOSIS — I96 Gangrene, not elsewhere classified: Secondary | ICD-10-CM

## 2021-05-14 DIAGNOSIS — L97522 Non-pressure chronic ulcer of other part of left foot with fat layer exposed: Secondary | ICD-10-CM | POA: Diagnosis not present

## 2021-05-14 DIAGNOSIS — I739 Peripheral vascular disease, unspecified: Secondary | ICD-10-CM

## 2021-05-16 ENCOUNTER — Encounter (INDEPENDENT_AMBULATORY_CARE_PROVIDER_SITE_OTHER): Payer: Self-pay | Admitting: Nurse Practitioner

## 2021-05-16 NOTE — Progress Notes (Signed)
Subjective:    Patient ID: [name], female    DOB: 1986/10/08, 35 y.o.   MRN: [id number] Chief Complaint  Patient presents with   Follow-up    3 wk armc  post LE angio     [name] is a 35 year old female that presents today for follow-up evaluation after upper extremity angiogram as well as left lower extremity angiogram.  The patient underwent multiple interventions 6/30 2022 including:   PROCEDURE PERFORMED: 1. Ultrasound guidance vascular access to right femoral artery. 2. Catheter placement to left radial artery and left ulnar arteries     from right femoral approach. 3. Thoracic aortogram and selective left upper extremity angiogram     including selective images of the radial and ulnar arteries. 4.  Percutaneous transluminal angioplasty of left radial artery with 2 mm diameter by 22 cm length angioplasty balloon with The patient also underwent lower extremity angiogram including:  Procedure(s) Performed:             1.  Ultrasound guidance for vascular access right femoral artery             2.  Catheter placement into left common femoral artery from right femoral approach             3.  Aortogram and selective left lower extremity angiogram             4.  Percutaneous transluminal angioplasty of left posterior tibial artery with 2.5 mm diameter by 30 cm length angioplasty balloon             5.  Percutaneous transluminal angioplasty of the left popliteal artery with 5 mm diameter by 10 cm length Lutonix drug-coated angioplasty balloon             6.  Viabahn stent placement to the left popliteal artery with 6 mm diameter by 2.5 cm length stent for residual stenosis after angioplasty             7.  StarClose closure device right femoral artery    Today the patient has multiple blisters on her left foot as well as a cold right lower extremity.  The patient's left upper extremity continues to have an ulceration but it is improving.  Today noninvasive  studies show an ABI 0.51 on the right and 1.05 on the left.  The patient has a TBI of 0.06 on the right and 0.24 on the left.  The patient has dampened monophasic waveforms in the bilateral tibial arteries with nearly flat toe waveforms bilaterally.   Review of Systems  Cardiovascular:  Positive for leg swelling.  Skin:  Positive for wound.  All other systems reviewed and are negative.     Objective:   Physical Exam Vitals reviewed.  HENT:     Head: Normocephalic.  Cardiovascular:     Rate and Rhythm: Normal rate.     Pulses:          Dorsalis pedis pulses are 0 on the right side and 0 on the left side.       Posterior tibial pulses are 0 on the right side and 0 on the left side.  Pulmonary:     Effort: Pulmonary effort is normal.  Neurological:     Mental Status: She is alert and oriented to person, place, and time.  Psychiatric:        Mood and Affect: Mood normal.        Behavior: Behavior normal.  Thought Content: Thought content normal.        Judgment: Judgment normal.    BP (!) 145/86   Pulse 97   Ht '5\' 7"'$  (1.702 m)   Wt (!) 301 lb (136.5 kg)   BMI 47.14 kg/m   Past Medical History:  Diagnosis Date   Anemia of chronic disease    Asthma    Calciphylaxis 2020   CHF (congestive heart failure) (HCC)    CKD (chronic kidney disease), stage III (HCC)    Diabetes ([place])    type 2   Hypertension    Morbid obesity (HCC)     Social History   Socioeconomic History   Marital status: Single    Spouse name: Not on file   Number of children: 0   Years of education: Not on file   Highest education level: Not on file  Occupational History   Occupation: works at [employer]   Tobacco Use   Smoking status: Former    Types: Cigars   Smokeless tobacco: Never  Scientific laboratory technician Use: Never used  Substance and Sexual Activity   Alcohol use: No    Alcohol/week: 0.0 standard drinks   Drug use: No   Sexual activity: Yes    Birth control/protection: None  Other  Topics Concern   Not on file  Social History Narrative   Lives with mother    Social Determinants of Health   Financial Resource Strain: Not on file  Food Insecurity: Not on file  Transportation Needs: Not on file  Physical Activity: Not on file  Stress: Not on file  Social Connections: Not on file  Intimate Partner Violence: Not on file    Past Surgical History:  Procedure Laterality Date   A/V FISTULAGRAM Left 10/29/2020   Procedure: A/V FISTULAGRAM;  Surgeon: Algernon Huxley, MD;  Location: [facility];  Service: Cardiovascular;  Laterality: Left;   APPLICATION OF WOUND VAC Bilateral 09/15/2019   Procedure: APPLICATION OF WOUND VAC;  Surgeon: Benjamine Sprague, DO;  Location: [facility];  Service: General;  Laterality: Bilateral;   AV FISTULA PLACEMENT Left 07/09/2020   Procedure: ARTERIOVENOUS (AV) FISTULA CREATION ( BRACHIAL CEPHALIC);  Surgeon: Algernon Huxley, MD;  Location: [facility];  Service: Vascular;  Laterality: Left;   DIALYSIS/PERMA CATHETER INSERTION N/A 02/28/2019   Procedure: DIALYSIS/PERMA CATHETER INSERTION;  Surgeon: Algernon Huxley, MD;  Location: [place] [facility];  Service: Cardiovascular;  Laterality: N/A;   DIALYSIS/PERMA CATHETER INSERTION N/A 09/17/2019   Procedure: DIALYSIS/PERMA CATHETER EXCHANGE;  Surgeon: Katha Cabal, MD;  Location: [facility] [facility];  Service: Cardiovascular;  Laterality: N/A;   DIALYSIS/PERMA CATHETER REMOVAL N/A 03/18/2020   Procedure: DIALYSIS/PERMA CATHETER REMOVAL;  Surgeon: Algernon Huxley, MD;  Location: [facility];  Service: Cardiovascular;  Laterality: N/A;   LOWER EXTREMITY ANGIOGRAPHY Left 12/02/2020   Procedure: LOWER EXTREMITY ANGIOGRAPHY;  Surgeon: Algernon Huxley, MD;  Location: [place] [facility];  Service: Cardiovascular;  Laterality: Left;   LOWER EXTREMITY ANGIOGRAPHY Right 01/25/2021   Procedure: LOWER EXTREMITY ANGIOGRAPHY;  Surgeon: Algernon Huxley, MD;  Location: [facility];  Service: Cardiovascular;   Laterality: Right;   LOWER EXTREMITY ANGIOGRAPHY Left 04/15/2021   Procedure: LOWER EXTREMITY ANGIOGRAPHY;  Surgeon: Algernon Huxley, MD;  Location: [facility];  Service: Cardiovascular;  Laterality: Left;   pd cath  05/21/2019   TONSILLECTOMY     UPPER EXTREMITY ANGIOGRAPHY Left 04/15/2021   Procedure:  UPPER EXTREMITY ANGIOGRAPHY;  Surgeon: Algernon Huxley, MD;  Location: [place];  Service: Cardiovascular;  Laterality: Left;   WOUND DEBRIDEMENT Bilateral 09/15/2019   Procedure: DEBRIDEMENT WOUND;  Surgeon: Benjamine Sprague, DO;  Location: [facility];  Service: General;  Laterality: Bilateral;    Family History  Problem Relation Age of Onset   Hypertension Mother    Multiple sclerosis Mother    CAD Mother    Cancer Maternal Grandmother        Ovarian    Cancer Maternal Grandfather        Prostate    Kidney disease Father     Allergies  Allergen Reactions   Tomato (Diagnostic) Itching    CBC Latest Ref Rng & Units 09/19/2020 07/09/2020 06/18/2020  WBC 4.0 - 10.5 K/uL 15.1(H) - 16.2(H)  Hemoglobin 12.0 - 15.0 g/dL 11.4(L) 13.9 11.8(L)  Hematocrit 36.0 - 46.0 % 34.3(L) 41.0 36.3  Platelets 150 - 400 K/uL 250 - 303      CMP     Component Value Date/Time   NA 139 09/19/2020 1505   NA 135 (L) 12/31/2013 1020   K 3.2 (L) 09/19/2020 1505   K 3.9 12/31/2013 1020   CL 95 (L) 09/19/2020 1505   CL 105 12/31/2013 1020   CO2 23 09/19/2020 1505   CO2 26 12/31/2013 1020   GLUCOSE 121 (H) 09/19/2020 1505   GLUCOSE 243 (H) 12/31/2013 1020   BUN 58 (H) 09/19/2020 1505   BUN 19 (H) 12/31/2013 1020   CREATININE 12.28 (H) 09/19/2020 1505   CREATININE 2.12 (H) 12/31/2013 1020   CALCIUM 7.6 (L) 09/19/2020 1505   CALCIUM 8.5 12/31/2013 1020   PROT 8.0 09/19/2020 1505   PROT 7.4 12/31/2013 1020   ALBUMIN 3.2 (L) 09/19/2020 1505   ALBUMIN 3.1 (L) 12/31/2013 1020   AST 11 (L) 09/19/2020 1505   AST 21 12/31/2013 1020   ALT 7 09/19/2020 1505   ALT 26 12/31/2013 1020   ALKPHOS  112 09/19/2020 1505   ALKPHOS 98 12/31/2013 1020   BILITOT 0.7 09/19/2020 1505   BILITOT 0.4 12/31/2013 1020   GFRNONAA 4 (L) 09/19/2020 1505   GFRNONAA 31 (L) 12/31/2013 1020   GFRAA 5 (L) 06/18/2020 1217   GFRAA 36 (L) 12/31/2013 1020     VAS [place] ABI WITH/WO TBI  Result Date: 05/11/2021  LOWER EXTREMITY DOPPLER STUDY Patient Name:  [name]  Date of Exam:   05/07/2021 Medical Rec #: [id number]          Accession #:    [id number] Date of Birth: [birth year]          Patient Gender: F Patient Age:   68Y Exam Location:  [facility] Procedure:      VAS [place] ABI WITH/WO TBI Referring Phys: [id number] [place] --------------------------------------------------------------------------------  Indications: Peripheral artery disease.  Vascular Interventions: 12/02/2020 left pta's of calf vessels; 01/25/2021 right                         pta of calf vessels. Comparison Study: 02/22/2021 Performing Technologist: Charlane Ferretti RT (R)(VS)  Examination Guidelines: A complete evaluation includes at minimum, Doppler waveform signals and systolic blood pressure reading at the level of bilateral brachial, anterior tibial, and posterior tibial arteries, when vessel segments are accessible. Bilateral testing is considered an integral part of a complete examination. Photoelectric Plethysmograph (PPG) waveforms and toe systolic pressure readings are included as required and additional  duplex testing as needed. Limited examinations for reoccurring indications may be performed as noted.  ABI Findings: +---------+------------------+-----+----------+----------------+ Right    Rt Pressure (mmHg)IndexWaveform  Comment          +---------+------------------+-----+----------+----------------+ Brachial 140                                               +---------+------------------+-----+----------+----------------+ ATA                             monophasicNon compressable  +---------+------------------+-----+----------+----------------+ PTA      72                0.51 monophasic                 +---------+------------------+-----+----------+----------------+ Great Toe8                 0.06 Abnormal                   +---------+------------------+-----+----------+----------------+ +---------+------------------+-----+----------+-------+ Left     Lt Pressure (mmHg)IndexWaveform  Comment +---------+------------------+-----+----------+-------+ Brachial                                  Port    +---------+------------------+-----+----------+-------+ ATA      147               1.05 monophasic        +---------+------------------+-----+----------+-------+ PTA      113               0.81 monophasic        +---------+------------------+-----+----------+-------+ Great Toe33                0.24 Abnormal          +---------+------------------+-----+----------+-------+ +-------+-----------+-----------+------------+------------+ ABI/TBIToday's ABIToday's TBIPrevious ABIPrevious TBI +-------+-----------+-----------+------------+------------+ Right  .51        .06        1.16        .60          +-------+-----------+-----------+------------+------------+ Left   1.05       .24        1.24        .37          +-------+-----------+-----------+------------+------------+ Right ABIs appear decreased compared to prior study on 02/22/2021. Left ABIs appear essentially unchanged compared to prior study on 02/22/2021. Left TBI's appears decreased as compared to the previous exam on 02/22/2021. Right TBI has very diminished flow as compared to the previous exam.  Summary: Right: Resting right ankle-brachial index indicates moderate right lower extremity arterial disease. The right toe-brachial index is abnormal. Left: Resting left ankle-brachial index is within normal range. No evidence of significant left lower extremity arterial disease. The left  toe-brachial index is abnormal. *See table(s) above for measurements and observations.  Electronically signed by [name] MD on 05/11/2021 at 5:01:48 PM.    Final        Assessment & Plan:   1. 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  This represents a limb threatening ischemia and places the patient at the risk  for limb loss.  Patient should undergo angiography of the lower extremities with the hope for intervention for limb salvage.  The risks and benefits as well as the alternative therapies was discussed in detail with the patient.  All questions were answered.  Patient agrees to proceed with angiography.  The patient will follow up with me in the office after the procedure.    2. Open wound of finger of left hand, sequela Wound is improving on the finger.  We will continue to monitor  3. Primary hypertension Continue antihypertensive medications as already ordered, these medications have been reviewed and there are no changes at this time.    Current Outpatient Medications on File Prior to Visit  Medication Sig Dispense Refill   amLODipine (NORVASC) 10 MG tablet Take 10 mg by mouth daily.     aspirin EC 81 MG EC tablet Take 1 tablet (81 mg total) by mouth daily. 30 tablet 2   atorvastatin (LIPITOR) 20 MG tablet Take 20 mg by mouth daily.     calcium acetate (PHOSLO) 667 MG capsule Take 2.0 Capsule Oral three times a day with meal     calcium citrate (CALCITRATE - DOSED IN MG ELEMENTAL CALCIUM) 950 (200 Ca) MG tablet Take 200 mg of elemental calcium by mouth 3 (three) times daily.     carvedilol (COREG) 25 MG tablet Take 1 tablet (25 mg total) by mouth 2 (two) times daily with a meal. 60 tablet 5   Cholecalciferol (VITAMIN D) 50 MCG (2000 UT) CAPS Take by mouth  daily.     cloNIDine (CATAPRES) 0.2 MG tablet Take 0.2 mg by mouth 3 (three) times daily.     clopidogrel (PLAVIX) 75 MG tablet Take 1 tablet (75 mg total) by mouth daily. 30 tablet 11   ferric citrate (AURYXIA) 1 GM 210 MG(Fe) tablet Take 420 mg by mouth 3 (three) times daily.     ferrous gluconate (FERGON) 324 MG tablet Take 324 mg by mouth daily.     gentamicin cream (GARAMYCIN) 0.1 % APPLY TO EXIT SITE DAILY     isosorbide mononitrate (IMDUR) 30 MG 24 hr tablet Take 1 tablet (30 mg total) by mouth 2 (two) times daily. 60 tablet 2   KLOR-CON M20 20 MEQ tablet Take 20 mEq by mouth daily.     losartan (COZAAR) 100 MG tablet      multivitamin (RENA-VIT) TABS tablet Take 1 tablet by mouth at bedtime. 30 tablet 0   mupirocin ointment (BACTROBAN) 2 % Apply 1 application topically 2 (two) times daily. 30 g 2   sodium thiosulfate 25 % injection Inject 12.5 g into the vein once.     torsemide (DEMADEX) 100 MG tablet Take 100 mg by mouth every morning.     traZODone (DESYREL) 50 MG tablet Take 50 mg by mouth at bedtime.     vitamin C (VITAMIN C) 500 MG tablet Take 1 tablet (500 mg total) by mouth 2 (two) times daily. 60 tablet 1   nitroGLYCERIN (NITROGLYN) 2 % OINT ointment Apply 1 application topically every 12 (twelve) hours. Applied just behind the toes on the left and right foot as needed 60 g 0   No current facility-administered medications on file prior to visit.    There are no Patient Instructions on file for this visit. No follow-ups on file.   Kris Hartmann, NP

## 2021-05-16 NOTE — H&P (View-Only) (Signed)
Subjective:    Patient ID: [name], female    DOB: May 24, 1986, 35 y.o.   MRN: [id number] Chief Complaint  Patient presents with   Follow-up    3 wk armc  post LE angio     [name] is a 35 year old female that presents today for follow-up evaluation after upper extremity angiogram as well as left lower extremity angiogram.  The patient underwent multiple interventions 6/30 2022 including:   PROCEDURE PERFORMED: 1. Ultrasound guidance vascular access to right femoral artery. 2. Catheter placement to left radial artery and left ulnar arteries     from right femoral approach. 3. Thoracic aortogram and selective left upper extremity angiogram     including selective images of the radial and ulnar arteries. 4.  Percutaneous transluminal angioplasty of left radial artery with 2 mm diameter by 22 cm length angioplasty balloon with The patient also underwent lower extremity angiogram including:  Procedure(s) Performed:             1.  Ultrasound guidance for vascular access right femoral artery             2.  Catheter placement into left common femoral artery from right femoral approach             3.  Aortogram and selective left lower extremity angiogram             4.  Percutaneous transluminal angioplasty of left posterior tibial artery with 2.5 mm diameter by 30 cm length angioplasty balloon             5.  Percutaneous transluminal angioplasty of the left popliteal artery with 5 mm diameter by 10 cm length Lutonix drug-coated angioplasty balloon             6.  Viabahn stent placement to the left popliteal artery with 6 mm diameter by 2.5 cm length stent for residual stenosis after angioplasty             7.  StarClose closure device right femoral artery    Today the patient has multiple blisters on her left foot as well as a cold right lower extremity.  The patient's left upper extremity continues to have an ulceration but it is improving.  Today noninvasive  studies show an ABI 0.51 on the right and 1.05 on the left.  The patient has a TBI of 0.06 on the right and 0.24 on the left.  The patient has dampened monophasic waveforms in the bilateral tibial arteries with nearly flat toe waveforms bilaterally.   Review of Systems  Cardiovascular:  Positive for leg swelling.  Skin:  Positive for wound.  All other systems reviewed and are negative.     Objective:   Physical Exam Vitals reviewed.  HENT:     Head: Normocephalic.  Cardiovascular:     Rate and Rhythm: Normal rate.     Pulses:          Dorsalis pedis pulses are 0 on the right side and 0 on the left side.       Posterior tibial pulses are 0 on the right side and 0 on the left side.  Pulmonary:     Effort: Pulmonary effort is normal.  Neurological:     Mental Status: She is alert and oriented to person, place, and time.  Psychiatric:        Mood and Affect: Mood normal.        Behavior: Behavior normal.  Thought Content: Thought content normal.        Judgment: Judgment normal.    BP (!) 145/86   Pulse 97   Ht '5\' 7"'$  (1.702 m)   Wt (!) 301 lb (136.5 kg)   BMI 47.14 kg/m   Past Medical History:  Diagnosis Date   Anemia of chronic disease    Asthma    Calciphylaxis 2020   CHF (congestive heart failure) (HCC)    CKD (chronic kidney disease), stage III (HCC)    Diabetes ([place])    type 2   Hypertension    Morbid obesity (HCC)     Social History   Socioeconomic History   Marital status: Single    Spouse name: Not on file   Number of children: 0   Years of education: Not on file   Highest education level: Not on file  Occupational History   Occupation: works at [employer]   Tobacco Use   Smoking status: Former    Types: Cigars   Smokeless tobacco: Never  Scientific laboratory technician Use: Never used  Substance and Sexual Activity   Alcohol use: No    Alcohol/week: 0.0 standard drinks   Drug use: No   Sexual activity: Yes    Birth control/protection: None  Other  Topics Concern   Not on file  Social History Narrative   Lives with mother    Social Determinants of Health   Financial Resource Strain: Not on file  Food Insecurity: Not on file  Transportation Needs: Not on file  Physical Activity: Not on file  Stress: Not on file  Social Connections: Not on file  Intimate Partner Violence: Not on file    Past Surgical History:  Procedure Laterality Date   A/V FISTULAGRAM Left 10/29/2020   Procedure: A/V FISTULAGRAM;  Surgeon: Algernon Huxley, MD;  Location: [facility];  Service: Cardiovascular;  Laterality: Left;   APPLICATION OF WOUND VAC Bilateral 09/15/2019   Procedure: APPLICATION OF WOUND VAC;  Surgeon: Benjamine Sprague, DO;  Location: [facility];  Service: General;  Laterality: Bilateral;   AV FISTULA PLACEMENT Left 07/09/2020   Procedure: ARTERIOVENOUS (AV) FISTULA CREATION ( BRACHIAL CEPHALIC);  Surgeon: Algernon Huxley, MD;  Location: [facility];  Service: Vascular;  Laterality: Left;   DIALYSIS/PERMA CATHETER INSERTION N/A 02/28/2019   Procedure: DIALYSIS/PERMA CATHETER INSERTION;  Surgeon: Algernon Huxley, MD;  Location: [facility];  Service: Cardiovascular;  Laterality: N/A;   DIALYSIS/PERMA CATHETER INSERTION N/A 09/17/2019   Procedure: DIALYSIS/PERMA CATHETER EXCHANGE;  Surgeon: Katha Cabal, MD;  Location: [facility];  Service: Cardiovascular;  Laterality: N/A;   DIALYSIS/PERMA CATHETER REMOVAL N/A 03/18/2020   Procedure: DIALYSIS/PERMA CATHETER REMOVAL;  Surgeon: Algernon Huxley, MD;  Location: [facility];  Service: Cardiovascular;  Laterality: N/A;   LOWER EXTREMITY ANGIOGRAPHY Left 12/02/2020   Procedure: LOWER EXTREMITY ANGIOGRAPHY;  Surgeon: Algernon Huxley, MD;  Location: [facility];  Service: Cardiovascular;  Laterality: Left;   LOWER EXTREMITY ANGIOGRAPHY Right 01/25/2021   Procedure: LOWER EXTREMITY ANGIOGRAPHY;  Surgeon: Algernon Huxley, MD;  Location: [facility];  Service: Cardiovascular;   Laterality: Right;   LOWER EXTREMITY ANGIOGRAPHY Left 04/15/2021   Procedure: LOWER EXTREMITY ANGIOGRAPHY;  Surgeon: Algernon Huxley, MD;  Location: [facility];  Service: Cardiovascular;  Laterality: Left;   pd cath  05/21/2019   TONSILLECTOMY     UPPER EXTREMITY ANGIOGRAPHY Left 04/15/2021   Procedure:  UPPER EXTREMITY ANGIOGRAPHY;  Surgeon: Algernon Huxley, MD;  Location: [facility];  Service: Cardiovascular;  Laterality: Left;   WOUND DEBRIDEMENT Bilateral 09/15/2019   Procedure: DEBRIDEMENT WOUND;  Surgeon: Benjamine Sprague, DO;  Location: [facility];  Service: General;  Laterality: Bilateral;    Family History  Problem Relation Age of Onset   Hypertension Mother    Multiple sclerosis Mother    CAD Mother    Cancer Maternal Grandmother        Ovarian    Cancer Maternal Grandfather        Prostate    Kidney disease Father     Allergies  Allergen Reactions   Tomato (Diagnostic) Itching    CBC Latest Ref Rng & Units 09/19/2020 07/09/2020 06/18/2020  WBC 4.0 - 10.5 K/uL 15.1(H) - 16.2(H)  Hemoglobin 12.0 - 15.0 g/dL 11.4(L) 13.9 11.8(L)  Hematocrit 36.0 - 46.0 % 34.3(L) 41.0 36.3  Platelets 150 - 400 K/uL 250 - 303      CMP     Component Value Date/Time   NA 139 09/19/2020 1505   NA 135 (L) 12/31/2013 1020   K 3.2 (L) 09/19/2020 1505   K 3.9 12/31/2013 1020   CL 95 (L) 09/19/2020 1505   CL 105 12/31/2013 1020   CO2 23 09/19/2020 1505   CO2 26 12/31/2013 1020   GLUCOSE 121 (H) 09/19/2020 1505   GLUCOSE 243 (H) 12/31/2013 1020   BUN 58 (H) 09/19/2020 1505   BUN 19 (H) 12/31/2013 1020   CREATININE 12.28 (H) 09/19/2020 1505   CREATININE 2.12 (H) 12/31/2013 1020   CALCIUM 7.6 (L) 09/19/2020 1505   CALCIUM 8.5 12/31/2013 1020   PROT 8.0 09/19/2020 1505   PROT 7.4 12/31/2013 1020   ALBUMIN 3.2 (L) 09/19/2020 1505   ALBUMIN 3.1 (L) 12/31/2013 1020   AST 11 (L) 09/19/2020 1505   AST 21 12/31/2013 1020   ALT 7 09/19/2020 1505   ALT 26 12/31/2013 1020   ALKPHOS  112 09/19/2020 1505   ALKPHOS 98 12/31/2013 1020   BILITOT 0.7 09/19/2020 1505   BILITOT 0.4 12/31/2013 1020   GFRNONAA 4 (L) 09/19/2020 1505   GFRNONAA 31 (L) 12/31/2013 1020   GFRAA 5 (L) 06/18/2020 1217   GFRAA 36 (L) 12/31/2013 1020     VAS [place] ABI WITH/WO TBI  Result Date: 05/11/2021  LOWER EXTREMITY DOPPLER STUDY Patient Name:  [name]  Date of Exam:   05/07/2021 Medical Rec #: [id number]          Accession #:    [id number] Date of Birth: 10/25/1985          Patient Gender: F Patient Age:   62Y Exam Location:  [facility] Procedure:      VAS [place] ABI WITH/WO TBI Referring Phys: [id number] [place] --------------------------------------------------------------------------------  Indications: Peripheral artery disease.  Vascular Interventions: 12/02/2020 left pta's of calf vessels; 01/25/2021 right                         pta of calf vessels. Comparison Study: 02/22/2021 Performing Technologist: Charlane Ferretti RT (R)(VS)  Examination Guidelines: A complete evaluation includes at minimum, Doppler waveform signals and systolic blood pressure reading at the level of bilateral brachial, anterior tibial, and posterior tibial arteries, when vessel segments are accessible. Bilateral testing is considered an integral part of a complete examination. Photoelectric Plethysmograph (PPG) waveforms and toe systolic pressure readings are included as required and additional  duplex testing as needed. Limited examinations for reoccurring indications may be performed as noted.  ABI Findings: +---------+------------------+-----+----------+----------------+ Right    Rt Pressure (mmHg)IndexWaveform  Comment          +---------+------------------+-----+----------+----------------+ Brachial 140                                               +---------+------------------+-----+----------+----------------+ ATA                             monophasicNon compressable  +---------+------------------+-----+----------+----------------+ PTA      72                0.51 monophasic                 +---------+------------------+-----+----------+----------------+ Great Toe8                 0.06 Abnormal                   +---------+------------------+-----+----------+----------------+ +---------+------------------+-----+----------+-------+ Left     Lt Pressure (mmHg)IndexWaveform  Comment +---------+------------------+-----+----------+-------+ Brachial                                  Port    +---------+------------------+-----+----------+-------+ ATA      147               1.05 monophasic        +---------+------------------+-----+----------+-------+ PTA      113               0.81 monophasic        +---------+------------------+-----+----------+-------+ Great Toe33                0.24 Abnormal          +---------+------------------+-----+----------+-------+ +-------+-----------+-----------+------------+------------+ ABI/TBIToday's ABIToday's TBIPrevious ABIPrevious TBI +-------+-----------+-----------+------------+------------+ Right  .51        .06        1.16        .60          +-------+-----------+-----------+------------+------------+ Left   1.05       .24        1.24        .37          +-------+-----------+-----------+------------+------------+ Right ABIs appear decreased compared to prior study on 02/22/2021. Left ABIs appear essentially unchanged compared to prior study on 02/22/2021. Left TBI's appears decreased as compared to the previous exam on 02/22/2021. Right TBI has very diminished flow as compared to the previous exam.  Summary: Right: Resting right ankle-brachial index indicates moderate right lower extremity arterial disease. The right toe-brachial index is abnormal. Left: Resting left ankle-brachial index is within normal range. No evidence of significant left lower extremity arterial disease. The left  toe-brachial index is abnormal. *See table(s) above for measurements and observations.  Electronically signed by [name] MD on 05/11/2021 at 5:01:48 PM.    Final        Assessment & Plan:   1. 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  This represents a limb threatening ischemia and places the patient at the risk  for limb loss.  Patient should undergo angiography of the lower extremities with the hope for intervention for limb salvage.  The risks and benefits as well as the alternative therapies was discussed in detail with the patient.  All questions were answered.  Patient agrees to proceed with angiography.  The patient will follow up with me in the office after the procedure.    2. Open wound of finger of left hand, sequela Wound is improving on the finger.  We will continue to monitor  3. Primary hypertension Continue antihypertensive medications as already ordered, these medications have been reviewed and there are no changes at this time.    Current Outpatient Medications on File Prior to Visit  Medication Sig Dispense Refill   amLODipine (NORVASC) 10 MG tablet Take 10 mg by mouth daily.     aspirin EC 81 MG EC tablet Take 1 tablet (81 mg total) by mouth daily. 30 tablet 2   atorvastatin (LIPITOR) 20 MG tablet Take 20 mg by mouth daily.     calcium acetate (PHOSLO) 667 MG capsule Take 2.0 Capsule Oral three times a day with meal     calcium citrate (CALCITRATE - DOSED IN MG ELEMENTAL CALCIUM) 950 (200 Ca) MG tablet Take 200 mg of elemental calcium by mouth 3 (three) times daily.     carvedilol (COREG) 25 MG tablet Take 1 tablet (25 mg total) by mouth 2 (two) times daily with a meal. 60 tablet 5   Cholecalciferol (VITAMIN D) 50 MCG (2000 UT) CAPS Take by mouth  daily.     cloNIDine (CATAPRES) 0.2 MG tablet Take 0.2 mg by mouth 3 (three) times daily.     clopidogrel (PLAVIX) 75 MG tablet Take 1 tablet (75 mg total) by mouth daily. 30 tablet 11   ferric citrate (AURYXIA) 1 GM 210 MG(Fe) tablet Take 420 mg by mouth 3 (three) times daily.     ferrous gluconate (FERGON) 324 MG tablet Take 324 mg by mouth daily.     gentamicin cream (GARAMYCIN) 0.1 % APPLY TO EXIT SITE DAILY     isosorbide mononitrate (IMDUR) 30 MG 24 hr tablet Take 1 tablet (30 mg total) by mouth 2 (two) times daily. 60 tablet 2   KLOR-CON M20 20 MEQ tablet Take 20 mEq by mouth daily.     losartan (COZAAR) 100 MG tablet      multivitamin (RENA-VIT) TABS tablet Take 1 tablet by mouth at bedtime. 30 tablet 0   mupirocin ointment (BACTROBAN) 2 % Apply 1 application topically 2 (two) times daily. 30 g 2   sodium thiosulfate 25 % injection Inject 12.5 g into the vein once.     torsemide (DEMADEX) 100 MG tablet Take 100 mg by mouth every morning.     traZODone (DESYREL) 50 MG tablet Take 50 mg by mouth at bedtime.     vitamin C (VITAMIN C) 500 MG tablet Take 1 tablet (500 mg total) by mouth 2 (two) times daily. 60 tablet 1   nitroGLYCERIN (NITROGLYN) 2 % OINT ointment Apply 1 application topically every 12 (twelve) hours. Applied just behind the toes on the left and right foot as needed 60 g 0   No current facility-administered medications on file prior to visit.    There are no Patient Instructions on file for this visit. No follow-ups on file.   Kris Hartmann, NP

## 2021-05-16 NOTE — H&P (View-Only) (Signed)
Subjective:    Patient ID: [name], female    DOB: [birth year], 35 y.o.   MRN: [id number] Chief Complaint  Patient presents with   Follow-up    3 wk armc  post LE angio     Chevelle Prisco is a 35 year old female that presents today for follow-up evaluation after upper extremity angiogram as well as left lower extremity angiogram.  The patient underwent multiple interventions 6/30 2022 including:   PROCEDURE PERFORMED: 1. Ultrasound guidance vascular access to right femoral artery. 2. Catheter placement to left radial artery and left ulnar arteries     from right femoral approach. 3. Thoracic aortogram and selective left upper extremity angiogram     including selective images of the radial and ulnar arteries. 4.  Percutaneous transluminal angioplasty of left radial artery with 2 mm diameter by 22 cm length angioplasty balloon with The patient also underwent lower extremity angiogram including:  Procedure(s) Performed:             1.  Ultrasound guidance for vascular access right femoral artery             2.  Catheter placement into left common femoral artery from right femoral approach             3.  Aortogram and selective left lower extremity angiogram             4.  Percutaneous transluminal angioplasty of left posterior tibial artery with 2.5 mm diameter by 30 cm length angioplasty balloon             5.  Percutaneous transluminal angioplasty of the left popliteal artery with 5 mm diameter by 10 cm length Lutonix drug-coated angioplasty balloon             6.  Viabahn stent placement to the left popliteal artery with 6 mm diameter by 2.5 cm length stent for residual stenosis after angioplasty             7.  StarClose closure device right femoral artery    Today the patient has multiple blisters on her left foot as well as a cold right lower extremity.  The patient's left upper extremity continues to have an ulceration but it is improving.  Today noninvasive  studies show an ABI 0.51 on the right and 1.05 on the left.  The patient has a TBI of 0.06 on the right and 0.24 on the left.  The patient has dampened monophasic waveforms in the bilateral tibial arteries with nearly flat toe waveforms bilaterally.   Review of Systems  Cardiovascular:  Positive for leg swelling.  Skin:  Positive for wound.  All other systems reviewed and are negative.     Objective:   Physical Exam Vitals reviewed.  HENT:     Head: Normocephalic.  Cardiovascular:     Rate and Rhythm: Normal rate.     Pulses:          Dorsalis pedis pulses are 0 on the right side and 0 on the left side.       Posterior tibial pulses are 0 on the right side and 0 on the left side.  Pulmonary:     Effort: Pulmonary effort is normal.  Neurological:     Mental Status: She is alert and oriented to person, place, and time.  Psychiatric:        Mood and Affect: Mood normal.        Behavior: Behavior normal.  Thought Content: Thought content normal.        Judgment: Judgment normal.    BP (!) 145/86   Pulse 97   Ht '5\' 7"'$  (1.702 m)   Wt (!) 301 lb (136.5 kg)   BMI 47.14 kg/m   Past Medical History:  Diagnosis Date   Anemia of chronic disease    Asthma    Calciphylaxis 2020   CHF (congestive heart failure) (HCC)    CKD (chronic kidney disease), stage III (HCC)    Diabetes ([place])    type 2   Hypertension    Morbid obesity (HCC)     Social History   Socioeconomic History   Marital status: Single    Spouse name: Not on file   Number of children: 0   Years of education: Not on file   Highest education level: Not on file  Occupational History   Occupation: works at [employer]   Tobacco Use   Smoking status: Former    Types: Cigars   Smokeless tobacco: Never  Scientific laboratory technician Use: Never used  Substance and Sexual Activity   Alcohol use: No    Alcohol/week: 0.0 standard drinks   Drug use: No   Sexual activity: Yes    Birth control/protection: None  Other  Topics Concern   Not on file  Social History Narrative   Lives with mother    Social Determinants of Health   Financial Resource Strain: Not on file  Food Insecurity: Not on file  Transportation Needs: Not on file  Physical Activity: Not on file  Stress: Not on file  Social Connections: Not on file  Intimate Partner Violence: Not on file    Past Surgical History:  Procedure Laterality Date   A/V FISTULAGRAM Left 10/29/2020   Procedure: A/V FISTULAGRAM;  Surgeon: Algernon Huxley, MD;  Location: [facility];  Service: Cardiovascular;  Laterality: Left;   APPLICATION OF WOUND VAC Bilateral 09/15/2019   Procedure: APPLICATION OF WOUND VAC;  Surgeon: Benjamine Sprague, DO;  Location: [facility];  Service: General;  Laterality: Bilateral;   AV FISTULA PLACEMENT Left 07/09/2020   Procedure: ARTERIOVENOUS (AV) FISTULA CREATION ( BRACHIAL CEPHALIC);  Surgeon: Algernon Huxley, MD;  Location: [facility];  Service: Vascular;  Laterality: Left;   DIALYSIS/PERMA CATHETER INSERTION N/A 02/28/2019   Procedure: DIALYSIS/PERMA CATHETER INSERTION;  Surgeon: Algernon Huxley, MD;  Location: [place] [facility];  Service: Cardiovascular;  Laterality: N/A;   DIALYSIS/PERMA CATHETER INSERTION N/A 09/17/2019   Procedure: DIALYSIS/PERMA CATHETER EXCHANGE;  Surgeon: Katha Cabal, MD;  Location: [place] [facility] [facility];  Service: Cardiovascular;  Laterality: N/A;   DIALYSIS/PERMA CATHETER REMOVAL N/A 03/18/2020   Procedure: DIALYSIS/PERMA CATHETER REMOVAL;  Surgeon: Algernon Huxley, MD;  Location: [facility];  Service: Cardiovascular;  Laterality: N/A;   LOWER EXTREMITY ANGIOGRAPHY Left 12/02/2020   Procedure: LOWER EXTREMITY ANGIOGRAPHY;  Surgeon: Algernon Huxley, MD;  Location: [place] [facility];  Service: Cardiovascular;  Laterality: Left;   LOWER EXTREMITY ANGIOGRAPHY Right 01/25/2021   Procedure: LOWER EXTREMITY ANGIOGRAPHY;  Surgeon: Algernon Huxley, MD;  Location: [facility];  Service: Cardiovascular;   Laterality: Right;   LOWER EXTREMITY ANGIOGRAPHY Left 04/15/2021   Procedure: LOWER EXTREMITY ANGIOGRAPHY;  Surgeon: Algernon Huxley, MD;  Location: [place] [facility];  Service: Cardiovascular;  Laterality: Left;   pd cath  05/21/2019   TONSILLECTOMY     UPPER EXTREMITY ANGIOGRAPHY Left 04/15/2021   Procedure:  UPPER EXTREMITY ANGIOGRAPHY;  Surgeon: Algernon Huxley, MD;  Location: [facility];  Service: Cardiovascular;  Laterality: Left;   WOUND DEBRIDEMENT Bilateral 09/15/2019   Procedure: DEBRIDEMENT WOUND;  Surgeon: Benjamine Sprague, DO;  Location: [facility];  Service: General;  Laterality: Bilateral;    Family History  Problem Relation Age of Onset   Hypertension Mother    Multiple sclerosis Mother    CAD Mother    Cancer Maternal Grandmother        Ovarian    Cancer Maternal Grandfather        Prostate    Kidney disease Father     Allergies  Allergen Reactions   Tomato (Diagnostic) Itching    CBC Latest Ref Rng & Units 09/19/2020 07/09/2020 06/18/2020  WBC 4.0 - 10.5 K/uL 15.1(H) - 16.2(H)  Hemoglobin 12.0 - 15.0 g/dL 11.4(L) 13.9 11.8(L)  Hematocrit 36.0 - 46.0 % 34.3(L) 41.0 36.3  Platelets 150 - 400 K/uL 250 - 303      CMP     Component Value Date/Time   NA 139 09/19/2020 1505   NA 135 (L) 12/31/2013 1020   K 3.2 (L) 09/19/2020 1505   K 3.9 12/31/2013 1020   CL 95 (L) 09/19/2020 1505   CL 105 12/31/2013 1020   CO2 23 09/19/2020 1505   CO2 26 12/31/2013 1020   GLUCOSE 121 (H) 09/19/2020 1505   GLUCOSE 243 (H) 12/31/2013 1020   BUN 58 (H) 09/19/2020 1505   BUN 19 (H) 12/31/2013 1020   CREATININE 12.28 (H) 09/19/2020 1505   CREATININE 2.12 (H) 12/31/2013 1020   CALCIUM 7.6 (L) 09/19/2020 1505   CALCIUM 8.5 12/31/2013 1020   PROT 8.0 09/19/2020 1505   PROT 7.4 12/31/2013 1020   ALBUMIN 3.2 (L) 09/19/2020 1505   ALBUMIN 3.1 (L) 12/31/2013 1020   AST 11 (L) 09/19/2020 1505   AST 21 12/31/2013 1020   ALT 7 09/19/2020 1505   ALT 26 12/31/2013 1020   ALKPHOS  112 09/19/2020 1505   ALKPHOS 98 12/31/2013 1020   BILITOT 0.7 09/19/2020 1505   BILITOT 0.4 12/31/2013 1020   GFRNONAA 4 (L) 09/19/2020 1505   GFRNONAA 31 (L) 12/31/2013 1020   GFRAA 5 (L) 06/18/2020 1217   GFRAA 36 (L) 12/31/2013 1020     VAS [place] ABI WITH/WO TBI  Result Date: 05/11/2021  LOWER EXTREMITY DOPPLER STUDY Patient Name:  [name]  Date of Exam:   05/07/2021 Medical Rec #: [id number]          Accession #:    [id number] Date of Birth: 25-Aug-1986          Patient Gender: F Patient Age:   67Y Exam Location:  [facility] Procedure:      VAS [place] ABI WITH/WO TBI Referring Phys: [id number] [place] --------------------------------------------------------------------------------  Indications: Peripheral artery disease.  Vascular Interventions: 12/02/2020 left pta's of calf vessels; 01/25/2021 right                         pta of calf vessels. Comparison Study: 02/22/2021 Performing Technologist: Charlane Ferretti RT (R)(VS)  Examination Guidelines: A complete evaluation includes at minimum, Doppler waveform signals and systolic blood pressure reading at the level of bilateral brachial, anterior tibial, and posterior tibial arteries, when vessel segments are accessible. Bilateral testing is considered an integral part of a complete examination. Photoelectric Plethysmograph (PPG) waveforms and toe systolic pressure readings are included as required and additional  duplex testing as needed. Limited examinations for reoccurring indications may be performed as noted.  ABI Findings: +---------+------------------+-----+----------+----------------+ Right    Rt Pressure (mmHg)IndexWaveform  Comment          +---------+------------------+-----+----------+----------------+ Brachial 140                                               +---------+------------------+-----+----------+----------------+ ATA                             monophasicNon compressable  +---------+------------------+-----+----------+----------------+ PTA      72                0.51 monophasic                 +---------+------------------+-----+----------+----------------+ Great Toe8                 0.06 Abnormal                   +---------+------------------+-----+----------+----------------+ +---------+------------------+-----+----------+-------+ Left     Lt Pressure (mmHg)IndexWaveform  Comment +---------+------------------+-----+----------+-------+ Brachial                                  Port    +---------+------------------+-----+----------+-------+ ATA      147               1.05 monophasic        +---------+------------------+-----+----------+-------+ PTA      113               0.81 monophasic        +---------+------------------+-----+----------+-------+ Great Toe33                0.24 Abnormal          +---------+------------------+-----+----------+-------+ +-------+-----------+-----------+------------+------------+ ABI/TBIToday's ABIToday's TBIPrevious ABIPrevious TBI +-------+-----------+-----------+------------+------------+ Right  .51        .06        1.16        .60          +-------+-----------+-----------+------------+------------+ Left   1.05       .24        1.24        .37          +-------+-----------+-----------+------------+------------+ Right ABIs appear decreased compared to prior study on 02/22/2021. Left ABIs appear essentially unchanged compared to prior study on 02/22/2021. Left TBI's appears decreased as compared to the previous exam on 02/22/2021. Right TBI has very diminished flow as compared to the previous exam.  Summary: Right: Resting right ankle-brachial index indicates moderate right lower extremity arterial disease. The right toe-brachial index is abnormal. Left: Resting left ankle-brachial index is within normal range. No evidence of significant left lower extremity arterial disease. The left  toe-brachial index is abnormal. *See table(s) above for measurements and observations.  Electronically signed by [name] MD on 05/11/2021 at 5:01:48 PM.    Final        Assessment & Plan:   1. 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)  Recommend:  The patient has evidence of severe atherosclerotic changes of both lower extremities associated with ulceration and tissue loss of the foot.  This represents a limb threatening ischemia and places the patient at the risk  for limb loss.  Patient should undergo angiography of the lower extremities with the hope for intervention for limb salvage.  The risks and benefits as well as the alternative therapies was discussed in detail with the patient.  All questions were answered.  Patient agrees to proceed with angiography.  The patient will follow up with me in the office after the procedure.    2. Open wound of finger of left hand, sequela Wound is improving on the finger.  We will continue to monitor  3. Primary hypertension Continue antihypertensive medications as already ordered, these medications have been reviewed and there are no changes at this time.    Current Outpatient Medications on File Prior to Visit  Medication Sig Dispense Refill   amLODipine (NORVASC) 10 MG tablet Take 10 mg by mouth daily.     aspirin EC 81 MG EC tablet Take 1 tablet (81 mg total) by mouth daily. 30 tablet 2   atorvastatin (LIPITOR) 20 MG tablet Take 20 mg by mouth daily.     calcium acetate (PHOSLO) 667 MG capsule Take 2.0 Capsule Oral three times a day with meal     calcium citrate (CALCITRATE - DOSED IN MG ELEMENTAL CALCIUM) 950 (200 Ca) MG tablet Take 200 mg of elemental calcium by mouth 3 (three) times daily.     carvedilol (COREG) 25 MG tablet Take 1 tablet (25 mg total) by mouth 2 (two) times daily with a meal. 60 tablet 5   Cholecalciferol (VITAMIN D) 50 MCG (2000 UT) CAPS Take by mouth  daily.     cloNIDine (CATAPRES) 0.2 MG tablet Take 0.2 mg by mouth 3 (three) times daily.     clopidogrel (PLAVIX) 75 MG tablet Take 1 tablet (75 mg total) by mouth daily. 30 tablet 11   ferric citrate (AURYXIA) 1 GM 210 MG(Fe) tablet Take 420 mg by mouth 3 (three) times daily.     ferrous gluconate (FERGON) 324 MG tablet Take 324 mg by mouth daily.     gentamicin cream (GARAMYCIN) 0.1 % APPLY TO EXIT SITE DAILY     isosorbide mononitrate (IMDUR) 30 MG 24 hr tablet Take 1 tablet (30 mg total) by mouth 2 (two) times daily. 60 tablet 2   KLOR-CON M20 20 MEQ tablet Take 20 mEq by mouth daily.     losartan (COZAAR) 100 MG tablet      multivitamin (RENA-VIT) TABS tablet Take 1 tablet by mouth at bedtime. 30 tablet 0   mupirocin ointment (BACTROBAN) 2 % Apply 1 application topically 2 (two) times daily. 30 g 2   sodium thiosulfate 25 % injection Inject 12.5 g into the vein once.     torsemide (DEMADEX) 100 MG tablet Take 100 mg by mouth every morning.     traZODone (DESYREL) 50 MG tablet Take 50 mg by mouth at bedtime.     vitamin C (VITAMIN C) 500 MG tablet Take 1 tablet (500 mg total) by mouth 2 (two) times daily. 60 tablet 1   nitroGLYCERIN (NITROGLYN) 2 % OINT ointment Apply 1 application topically every 12 (twelve) hours. Applied just behind the toes on the left and right foot as needed 60 g 0   No current facility-administered medications on file prior to visit.    There are no Patient Instructions on file for this visit. No follow-ups on file.   Kris Hartmann, NP

## 2021-05-16 NOTE — H&P (View-Only) (Signed)
Subjective:    Patient ID: [name], female    DOB: 10/06/1986, 35 y.o.   MRN: [id number] Chief Complaint  Patient presents with   Follow-up    3 wk armc  post LE angio     [name] is a 35 year old female that presents today for follow-up evaluation after upper extremity angiogram as well as left lower extremity angiogram.  The patient underwent multiple interventions 6/30 2022 including:   PROCEDURE PERFORMED: 1. Ultrasound guidance vascular access to right femoral artery. 2. Catheter placement to left radial artery and left ulnar arteries     from right femoral approach. 3. Thoracic aortogram and selective left upper extremity angiogram     including selective images of the radial and ulnar arteries. 4.  Percutaneous transluminal angioplasty of left radial artery with 2 mm diameter by 22 cm length angioplasty balloon with The patient also underwent lower extremity angiogram including:  Procedure(s) Performed:             1.  Ultrasound guidance for vascular access right femoral artery             2.  Catheter placement into left common femoral artery from right femoral approach             3.  Aortogram and selective left lower extremity angiogram             4.  Percutaneous transluminal angioplasty of left posterior tibial artery with 2.5 mm diameter by 30 cm length angioplasty balloon             5.  Percutaneous transluminal angioplasty of the left popliteal artery with 5 mm diameter by 10 cm length Lutonix drug-coated angioplasty balloon             6.  Viabahn stent placement to the left popliteal artery with 6 mm diameter by 2.5 cm length stent for residual stenosis after angioplasty             7.  StarClose closure device right femoral artery    Today the patient has multiple blisters on her left foot as well as a cold right lower extremity.  The patient's left upper extremity continues to have an ulceration but it is improving.  Today noninvasive  studies show an ABI 0.51 on the right and 1.05 on the left.  The patient has a TBI of 0.06 on the right and 0.24 on the left.  The patient has dampened monophasic waveforms in the bilateral tibial arteries with nearly flat toe waveforms bilaterally.   Review of Systems  Cardiovascular:  Positive for leg swelling.  Skin:  Positive for wound.  All other systems reviewed and are negative.     Objective:   Physical Exam Vitals reviewed.  HENT:     Head: Normocephalic.  Cardiovascular:     Rate and Rhythm: Normal rate.     Pulses:          Dorsalis pedis pulses are 0 on the right side and 0 on the left side.       Posterior tibial pulses are 0 on the right side and 0 on the left side.  Pulmonary:     Effort: Pulmonary effort is normal.  Neurological:     Mental Status: She is alert and oriented to person, place, and time.  Psychiatric:        Mood and Affect: Mood normal.        Behavior: Behavior normal.  Thought Content: Thought content normal.        Judgment: Judgment normal.    BP (!) 145/86   Pulse 97   Ht '5\' 7"'$  (1.702 m)   Wt (!) 301 lb (136.5 kg)   BMI 47.14 kg/m   Past Medical History:  Diagnosis Date   Anemia of chronic disease    Asthma    Calciphylaxis 2020   CHF (congestive heart failure) (HCC)    CKD (chronic kidney disease), stage III (HCC)    Diabetes ([place])    type 2   Hypertension    Morbid obesity (HCC)     Social History   Socioeconomic History   Marital status: Single    Spouse name: Not on file   Number of children: 0   Years of education: Not on file   Highest education level: Not on file  Occupational History   Occupation: works at [employer] 6   Tobacco Use   Smoking status: Former    Types: Cigars   Smokeless tobacco: Never  Scientific laboratory technician Use: Never used  Substance and Sexual Activity   Alcohol use: No    Alcohol/week: 0.0 standard drinks   Drug use: No   Sexual activity: Yes    Birth control/protection: None  Other  Topics Concern   Not on file  Social History Narrative   Lives with mother    Social Determinants of Health   Financial Resource Strain: Not on file  Food Insecurity: Not on file  Transportation Needs: Not on file  Physical Activity: Not on file  Stress: Not on file  Social Connections: Not on file  Intimate Partner Violence: Not on file    Past Surgical History:  Procedure Laterality Date   A/V FISTULAGRAM Left 10/29/2020   Procedure: A/V FISTULAGRAM;  Surgeon: Algernon Huxley, MD;  Location: [facility];  Service: Cardiovascular;  Laterality: Left;   APPLICATION OF WOUND VAC Bilateral 09/15/2019   Procedure: APPLICATION OF WOUND VAC;  Surgeon: Benjamine Sprague, DO;  Location: [facility];  Service: General;  Laterality: Bilateral;   AV FISTULA PLACEMENT Left 07/09/2020   Procedure: ARTERIOVENOUS (AV) FISTULA CREATION ( BRACHIAL CEPHALIC);  Surgeon: Algernon Huxley, MD;  Location: [facility];  Service: Vascular;  Laterality: Left;   DIALYSIS/PERMA CATHETER INSERTION N/A 02/28/2019   Procedure: DIALYSIS/PERMA CATHETER INSERTION;  Surgeon: Algernon Huxley, MD;  Location: [facility];  Service: Cardiovascular;  Laterality: N/A;   DIALYSIS/PERMA CATHETER INSERTION N/A 09/17/2019   Procedure: DIALYSIS/PERMA CATHETER EXCHANGE;  Surgeon: Katha Cabal, MD;  Location: [facility];  Service: Cardiovascular;  Laterality: N/A;   DIALYSIS/PERMA CATHETER REMOVAL N/A 03/18/2020   Procedure: DIALYSIS/PERMA CATHETER REMOVAL;  Surgeon: Algernon Huxley, MD;  Location: [facility];  Service: Cardiovascular;  Laterality: N/A;   LOWER EXTREMITY ANGIOGRAPHY Left 12/02/2020   Procedure: LOWER EXTREMITY ANGIOGRAPHY;  Surgeon: Algernon Huxley, MD;  Location: [facility];  Service: Cardiovascular;  Laterality: Left;   LOWER EXTREMITY ANGIOGRAPHY Right 01/25/2021   Procedure: LOWER EXTREMITY ANGIOGRAPHY;  Surgeon: Algernon Huxley, MD;  Location: [facility];  Service: Cardiovascular;   Laterality: Right;   LOWER EXTREMITY ANGIOGRAPHY Left 04/15/2021   Procedure: LOWER EXTREMITY ANGIOGRAPHY;  Surgeon: Algernon Huxley, MD;  Location: [facility];  Service: Cardiovascular;  Laterality: Left;   pd cath  05/21/2019   TONSILLECTOMY     UPPER EXTREMITY ANGIOGRAPHY Left 04/15/2021   Procedure:  UPPER EXTREMITY ANGIOGRAPHY;  Surgeon: Algernon Huxley, MD;  Location: [facility];  Service: Cardiovascular;  Laterality: Left;   WOUND DEBRIDEMENT Bilateral 09/15/2019   Procedure: DEBRIDEMENT WOUND;  Surgeon: Benjamine Sprague, DO;  Location: [facility];  Service: General;  Laterality: Bilateral;    Family History  Problem Relation Age of Onset   Hypertension Mother    Multiple sclerosis Mother    CAD Mother    Cancer Maternal Grandmother        Ovarian    Cancer Maternal Grandfather        Prostate    Kidney disease Father     Allergies  Allergen Reactions   Tomato (Diagnostic) Itching    CBC Latest Ref Rng & Units 09/19/2020 07/09/2020 06/18/2020  WBC 4.0 - 10.5 K/uL 15.1(H) - 16.2(H)  Hemoglobin 12.0 - 15.0 g/dL 11.4(L) 13.9 11.8(L)  Hematocrit 36.0 - 46.0 % 34.3(L) 41.0 36.3  Platelets 150 - 400 K/uL 250 - 303      CMP     Component Value Date/Time   NA 139 09/19/2020 1505   NA 135 (L) 12/31/2013 1020   K 3.2 (L) 09/19/2020 1505   K 3.9 12/31/2013 1020   CL 95 (L) 09/19/2020 1505   CL 105 12/31/2013 1020   CO2 23 09/19/2020 1505   CO2 26 12/31/2013 1020   GLUCOSE 121 (H) 09/19/2020 1505   GLUCOSE 243 (H) 12/31/2013 1020   BUN 58 (H) 09/19/2020 1505   BUN 19 (H) 12/31/2013 1020   CREATININE 12.28 (H) 09/19/2020 1505   CREATININE 2.12 (H) 12/31/2013 1020   CALCIUM 7.6 (L) 09/19/2020 1505   CALCIUM 8.5 12/31/2013 1020   PROT 8.0 09/19/2020 1505   PROT 7.4 12/31/2013 1020   ALBUMIN 3.2 (L) 09/19/2020 1505   ALBUMIN 3.1 (L) 12/31/2013 1020   AST 11 (L) 09/19/2020 1505   AST 21 12/31/2013 1020   ALT 7 09/19/2020 1505   ALT 26 12/31/2013 1020   ALKPHOS  112 09/19/2020 1505   ALKPHOS 98 12/31/2013 1020   BILITOT 0.7 09/19/2020 1505   BILITOT 0.4 12/31/2013 1020   GFRNONAA 4 (L) 09/19/2020 1505   GFRNONAA 31 (L) 12/31/2013 1020   GFRAA 5 (L) 06/18/2020 1217   GFRAA 36 (L) 12/31/2013 1020     VAS [place] ABI WITH/WO TBI  Result Date: 05/11/2021  LOWER EXTREMITY DOPPLER STUDY Patient Name:  [name]  Date of Exam:   05/07/2021 Medical Rec #: [id number]          Accession #:    [id number] Date of Birth: [birth year]          Patient Gender: F Patient Age:   28Y Exam Location:  [facility] Procedure:      VAS [place] ABI WITH/WO TBI Referring Phys: [id number] [place] --------------------------------------------------------------------------------  Indications: Peripheral artery disease.  Vascular Interventions: 12/02/2020 left pta's of calf vessels; 01/25/2021 right                         pta of calf vessels. Comparison Study: 02/22/2021 Performing Technologist: Charlane Ferretti RT (R)(VS)  Examination Guidelines: A complete evaluation includes at minimum, Doppler waveform signals and systolic blood pressure reading at the level of bilateral brachial, anterior tibial, and posterior tibial arteries, when vessel segments are accessible. Bilateral testing is considered an integral part of a complete examination. Photoelectric Plethysmograph (PPG) waveforms and toe systolic pressure readings are included as required and additional  duplex testing as needed. Limited examinations for reoccurring indications may be performed as noted.  ABI Findings: +---------+------------------+-----+----------+----------------+ Right    Rt Pressure (mmHg)IndexWaveform  Comment          +---------+------------------+-----+----------+----------------+ Brachial 140                                               +---------+------------------+-----+----------+----------------+ ATA                             monophasicNon compressable  +---------+------------------+-----+----------+----------------+ PTA      72                0.51 monophasic                 +---------+------------------+-----+----------+----------------+ Great Toe8                 0.06 Abnormal                   +---------+------------------+-----+----------+----------------+ +---------+------------------+-----+----------+-------+ Left     Lt Pressure (mmHg)IndexWaveform  Comment +---------+------------------+-----+----------+-------+ Brachial                                  Port    +---------+------------------+-----+----------+-------+ ATA      147               1.05 monophasic        +---------+------------------+-----+----------+-------+ PTA      113               0.81 monophasic        +---------+------------------+-----+----------+-------+ Great Toe33                0.24 Abnormal          +---------+------------------+-----+----------+-------+ +-------+-----------+-----------+------------+------------+ ABI/TBIToday's ABIToday's TBIPrevious ABIPrevious TBI +-------+-----------+-----------+------------+------------+ Right  .51        .06        1.16        .60          +-------+-----------+-----------+------------+------------+ Left   1.05       .24        1.24        .37          +-------+-----------+-----------+------------+------------+ Right ABIs appear decreased compared to prior study on 02/22/2021. Left ABIs appear essentially unchanged compared to prior study on 02/22/2021. Left TBI's appears decreased as compared to the previous exam on 02/22/2021. Right TBI has very diminished flow as compared to the previous exam.  Summary: Right: Resting right ankle-brachial index indicates moderate right lower extremity arterial disease. The right toe-brachial index is abnormal. Left: Resting left ankle-brachial index is within normal range. No evidence of significant left lower extremity arterial disease. The left  toe-brachial index is abnormal. *See table(s) above for measurements and observations.  Electronically signed by [name] MD on 05/11/2021 at 5:01:48 PM.    Final        Assessment & Plan:   1. 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  This represents a limb threatening ischemia and places the patient at the risk  for limb loss.  Patient should undergo angiography of the lower extremities with the hope for intervention for limb salvage.  The risks and benefits as well as the alternative therapies was discussed in detail with the patient.  All questions were answered.  Patient agrees to proceed with angiography.  The patient will follow up with me in the office after the procedure.    2. Open wound of finger of left hand, sequela Wound is improving on the finger.  We will continue to monitor  3. Primary hypertension Continue antihypertensive medications as already ordered, these medications have been reviewed and there are no changes at this time.    Current Outpatient Medications on File Prior to Visit  Medication Sig Dispense Refill   amLODipine (NORVASC) 10 MG tablet Take 10 mg by mouth daily.     aspirin EC 81 MG EC tablet Take 1 tablet (81 mg total) by mouth daily. 30 tablet 2   atorvastatin (LIPITOR) 20 MG tablet Take 20 mg by mouth daily.     calcium acetate (PHOSLO) 667 MG capsule Take 2.0 Capsule Oral three times a day with meal     calcium citrate (CALCITRATE - DOSED IN MG ELEMENTAL CALCIUM) 950 (200 Ca) MG tablet Take 200 mg of elemental calcium by mouth 3 (three) times daily.     carvedilol (COREG) 25 MG tablet Take 1 tablet (25 mg total) by mouth 2 (two) times daily with a meal. 60 tablet 5   Cholecalciferol (VITAMIN D) 50 MCG (2000 UT) CAPS Take by mouth  daily.     cloNIDine (CATAPRES) 0.2 MG tablet Take 0.2 mg by mouth 3 (three) times daily.     clopidogrel (PLAVIX) 75 MG tablet Take 1 tablet (75 mg total) by mouth daily. 30 tablet 11   ferric citrate (AURYXIA) 1 GM 210 MG(Fe) tablet Take 420 mg by mouth 3 (three) times daily.     ferrous gluconate (FERGON) 324 MG tablet Take 324 mg by mouth daily.     gentamicin cream (GARAMYCIN) 0.1 % APPLY TO EXIT SITE DAILY     isosorbide mononitrate (IMDUR) 30 MG 24 hr tablet Take 1 tablet (30 mg total) by mouth 2 (two) times daily. 60 tablet 2   KLOR-CON M20 20 MEQ tablet Take 20 mEq by mouth daily.     losartan (COZAAR) 100 MG tablet      multivitamin (RENA-VIT) TABS tablet Take 1 tablet by mouth at bedtime. 30 tablet 0   mupirocin ointment (BACTROBAN) 2 % Apply 1 application topically 2 (two) times daily. 30 g 2   sodium thiosulfate 25 % injection Inject 12.5 g into the vein once.     torsemide (DEMADEX) 100 MG tablet Take 100 mg by mouth every morning.     traZODone (DESYREL) 50 MG tablet Take 50 mg by mouth at bedtime.     vitamin C (VITAMIN C) 500 MG tablet Take 1 tablet (500 mg total) by mouth 2 (two) times daily. 60 tablet 1   nitroGLYCERIN (NITROGLYN) 2 % OINT ointment Apply 1 application topically every 12 (twelve) hours. Applied just behind the toes on the left and right foot as needed 60 g 0   No current facility-administered medications on file prior to visit.    There are no Patient Instructions on file for this visit. No follow-ups on file.   Kris Hartmann, NP

## 2021-05-18 NOTE — Progress Notes (Signed)
   Subjective:  35 y.o. female with PMHx of diabetes mellitus, PAD presenting for discoloration and ischemia to the right fifth toe.  She was last seen in the ear in the office on 04/21/2021 under Dr. Sherryle Lis.  Patient states that she has an angio procedure on 8/4 and 05/27/2021 with Dr. [name].   Past Medical History:  Diagnosis Date   Anemia of chronic disease    Asthma    Calciphylaxis 2020   CHF (congestive heart failure) ([place])    CKD (chronic kidney disease), stage III ([place])    Diabetes ([place])    type 2   Hypertension    Morbid obesity ([place])          Objective/Physical Exam General: The patient is alert and oriented x3 in no acute distress.  Dermatology:  Chronic dry stable ischemia noted to the right fifth digit as well as the distal tip of the left second toe.  No drainage noted.  No malodor noted.  Vascular: Absent pedal pulses bilaterally.  Mild edema noted bilateral lower extremities.  No erythema or warmth.  Skin cold to touch Neurological: Epicritic and protective threshold diminished bilaterally.   Musculoskeletal Exam: History of prior to toe amputation left fifth digit  Assessment: 1.  Ischemia RT fifth and LT second secondary to diabetes mellitus 2. diabetes mellitus w/ peripheral neuropathy 3.  Peripheral arterial disease bilateral lower extremities   Plan of Care:  1. Patient was evaluated. 2.  Continue close follow-up with Dr. [name], vascular surgery. 3.  Continue Betadine applied daily to the toes 4.  Return to clinic with Dr. Sherryle Lis 1 week after angio procedures with Dr. [name], vascular, for possible surgical amputation of the toes and to evaluate demarcation of viable and nonviable tissue due to the ischemia   Edrick Kins, DPM [facility]  Dr. Edrick Kins, DPM    [address], [place] [address]                Office [phone]  Fax [phone]

## 2021-05-19 ENCOUNTER — Telehealth (INDEPENDENT_AMBULATORY_CARE_PROVIDER_SITE_OTHER): Payer: Self-pay

## 2021-05-20 ENCOUNTER — Other Ambulatory Visit: Payer: Self-pay

## 2021-05-20 ENCOUNTER — Encounter: Payer: Self-pay | Admitting: Vascular Surgery

## 2021-05-20 ENCOUNTER — Other Ambulatory Visit (INDEPENDENT_AMBULATORY_CARE_PROVIDER_SITE_OTHER): Payer: Self-pay | Admitting: Nurse Practitioner

## 2021-05-20 ENCOUNTER — Encounter
Admission: RE | Disposition: A | Discharge status: Home / Self Care | Payer: Self-pay | Source: Home / Self Care | Attending: Vascular Surgery

## 2021-05-20 ENCOUNTER — Ambulatory Visit
Admission: RE | Admit: 2021-05-20 | Discharge: 2021-05-20 | Disposition: A | Discharge status: Home / Self Care | Payer: Medicare Other | Attending: Vascular Surgery | Admitting: Vascular Surgery

## 2021-05-20 DIAGNOSIS — L97909 Non-pressure chronic ulcer of unspecified part of unspecified lower leg with unspecified severity: Secondary | ICD-10-CM

## 2021-05-20 DIAGNOSIS — I70238 Atherosclerosis of native arteries of right leg with ulceration of other part of lower right leg: Secondary | ICD-10-CM | POA: Diagnosis not present

## 2021-05-20 DIAGNOSIS — I70235 Atherosclerosis of native arteries of right leg with ulceration of other part of foot: Secondary | ICD-10-CM | POA: Insufficient documentation

## 2021-05-20 DIAGNOSIS — E1122 Type 2 diabetes mellitus with diabetic chronic kidney disease: Secondary | ICD-10-CM | POA: Diagnosis not present

## 2021-05-20 DIAGNOSIS — L97519 Non-pressure chronic ulcer of other part of right foot with unspecified severity: Secondary | ICD-10-CM | POA: Insufficient documentation

## 2021-05-20 DIAGNOSIS — Z87891 Personal history of nicotine dependence: Secondary | ICD-10-CM | POA: Insufficient documentation

## 2021-05-20 DIAGNOSIS — Z841 Family history of disorders of kidney and ureter: Secondary | ICD-10-CM | POA: Insufficient documentation

## 2021-05-20 DIAGNOSIS — Z8249 Family history of ischemic heart disease and other diseases of the circulatory system: Secondary | ICD-10-CM | POA: Diagnosis not present

## 2021-05-20 DIAGNOSIS — I509 Heart failure, unspecified: Secondary | ICD-10-CM | POA: Diagnosis not present

## 2021-05-20 DIAGNOSIS — N183 Chronic kidney disease, stage 3 unspecified: Secondary | ICD-10-CM | POA: Insufficient documentation

## 2021-05-20 DIAGNOSIS — Z79899 Other long term (current) drug therapy: Secondary | ICD-10-CM | POA: Insufficient documentation

## 2021-05-20 DIAGNOSIS — Z95828 Presence of other vascular implants and grafts: Secondary | ICD-10-CM | POA: Insufficient documentation

## 2021-05-20 DIAGNOSIS — Z7982 Long term (current) use of aspirin: Secondary | ICD-10-CM | POA: Diagnosis not present

## 2021-05-20 DIAGNOSIS — Z6841 Body Mass Index (BMI) 40.0 and over, adult: Secondary | ICD-10-CM | POA: Diagnosis not present

## 2021-05-20 DIAGNOSIS — Z7902 Long term (current) use of antithrombotics/antiplatelets: Secondary | ICD-10-CM | POA: Diagnosis not present

## 2021-05-20 DIAGNOSIS — I70299 Other atherosclerosis of native arteries of extremities, unspecified extremity: Secondary | ICD-10-CM

## 2021-05-20 DIAGNOSIS — I130 Hypertensive heart and chronic kidney disease with heart failure and stage 1 through stage 4 chronic kidney disease, or unspecified chronic kidney disease: Secondary | ICD-10-CM | POA: Insufficient documentation

## 2021-05-20 HISTORY — PX: LOWER EXTREMITY ANGIOGRAPHY: CATH118251

## 2021-05-20 LAB — POTASSIUM (ARMC VASCULAR LAB ONLY): Potassium (ARMC vascular lab): 3.8 (ref 3.5–5.1)

## 2021-05-20 LAB — GLUCOSE, CAPILLARY: Glucose-Capillary: 114 mg/dL — ABNORMAL HIGH (ref 70–99)

## 2021-05-20 SURGERY — LOWER EXTREMITY ANGIOGRAPHY
Anesthesia: Moderate Sedation | Site: Leg Lower | Laterality: Right

## 2021-05-20 MED ORDER — CLOPIDOGREL BISULFATE 75 MG PO TABS: 75.0000 mg | ORAL_TABLET | Freq: Every day | ORAL | Status: DC

## 2021-05-20 MED ORDER — ATORVASTATIN CALCIUM 10 MG PO TABS: 10.0000 mg | ORAL_TABLET | Freq: Every day | ORAL | Status: DC

## 2021-05-20 MED ORDER — SODIUM CHLORIDE 0.9 % IV SOLN: INTRAVENOUS | Status: DC

## 2021-05-20 MED ORDER — ACETAMINOPHEN 325 MG PO TABS: 650.0000 mg | ORAL_TABLET | ORAL | Status: DC | PRN

## 2021-05-20 MED ORDER — CEFAZOLIN IN SODIUM CHLORIDE 3-0.9 GM/100ML-% IV SOLN
3.0000 g | Freq: Once | INTRAVENOUS | Status: DC
  Filled 2021-05-20: qty 100

## 2021-05-20 MED ORDER — HYDRALAZINE HCL 20 MG/ML IJ SOLN
5.0000 mg | INTRAMUSCULAR | Status: DC | PRN
Start: 2021-05-20 — End: 2021-05-20

## 2021-05-20 MED ORDER — CEFAZOLIN SODIUM-DEXTROSE 1-4 GM/50ML-% IV SOLN: 1.0000 g | Freq: Once | INTRAVENOUS | Status: AC

## 2021-05-20 MED ORDER — MIDAZOLAM HCL 2 MG/ML PO SYRP: 8.0000 mg | ORAL_SOLUTION | Freq: Once | ORAL | Status: DC | PRN

## 2021-05-20 MED ORDER — HYDROMORPHONE HCL 1 MG/ML IJ SOLN: 1.0000 mg | Freq: Once | INTRAMUSCULAR | Status: DC | PRN

## 2021-05-20 MED ORDER — FENTANYL CITRATE (PF) 100 MCG/2ML IJ SOLN
INTRAMUSCULAR | Status: AC
  Filled 2021-05-20: qty 2

## 2021-05-20 MED ORDER — SODIUM CHLORIDE 0.9% FLUSH: 3.0000 mL | INTRAVENOUS | Status: DC | PRN

## 2021-05-20 MED ORDER — CEFAZOLIN SODIUM-DEXTROSE 1-4 GM/50ML-% IV SOLN
INTRAVENOUS | Status: AC
  Administered 2021-05-20: 1 g
  Filled 2021-05-20: qty 50

## 2021-05-20 MED ORDER — SODIUM CHLORIDE 0.9 % IV SOLN: 250.0000 mL | INTRAVENOUS | Status: DC | PRN

## 2021-05-20 MED ORDER — MIDAZOLAM HCL 2 MG/2ML IJ SOLN
INTRAMUSCULAR | Status: DC | PRN
  Administered 2021-05-20: 1 mg via INTRAVENOUS
  Administered 2021-05-20: 2 mg via INTRAVENOUS

## 2021-05-20 MED ORDER — DIPHENHYDRAMINE HCL 50 MG/ML IJ SOLN: 50.0000 mg | Freq: Once | INTRAMUSCULAR | Status: DC | PRN

## 2021-05-20 MED ORDER — IODIXANOL 320 MG/ML IV SOLN
INTRAVENOUS | Status: DC | PRN
  Administered 2021-05-20: 40 mL

## 2021-05-20 MED ORDER — CLOPIDOGREL BISULFATE 75 MG PO TABS
ORAL_TABLET | ORAL | Status: AC
  Administered 2021-05-20: 75 mg via ORAL
  Filled 2021-05-20: qty 1

## 2021-05-20 MED ORDER — FENTANYL CITRATE (PF) 100 MCG/2ML IJ SOLN
INTRAMUSCULAR | Status: DC | PRN
  Administered 2021-05-20 (×2): 50 ug via INTRAVENOUS

## 2021-05-20 MED ORDER — SODIUM CHLORIDE 0.9% FLUSH: 3.0000 mL | Freq: Two times a day (BID) | INTRAVENOUS | Status: DC

## 2021-05-20 MED ORDER — LABETALOL HCL 5 MG/ML IV SOLN: 10.0000 mg | INTRAVENOUS | Status: DC | PRN

## 2021-05-20 MED ORDER — MIDAZOLAM HCL 5 MG/5ML IJ SOLN
INTRAMUSCULAR | Status: AC
  Filled 2021-05-20: qty 5

## 2021-05-20 MED ORDER — METHYLPREDNISOLONE SODIUM SUCC 125 MG IJ SOLR: 125.0000 mg | Freq: Once | INTRAMUSCULAR | Status: DC | PRN

## 2021-05-20 MED ORDER — HEPARIN SODIUM (PORCINE) 1000 UNIT/ML IJ SOLN
INTRAMUSCULAR | Status: AC
  Filled 2021-05-20: qty 1

## 2021-05-20 MED ORDER — HEPARIN SODIUM (PORCINE) 1000 UNIT/ML IJ SOLN
INTRAMUSCULAR | Status: DC | PRN
  Administered 2021-05-20: 5000 [IU] via INTRAVENOUS

## 2021-05-20 MED ORDER — FAMOTIDINE 20 MG PO TABS: 40.0000 mg | ORAL_TABLET | Freq: Once | ORAL | Status: DC | PRN

## 2021-05-20 MED ORDER — ONDANSETRON HCL 4 MG/2ML IJ SOLN: 4.0000 mg | Freq: Four times a day (QID) | INTRAMUSCULAR | Status: DC | PRN

## 2021-05-20 SURGICAL SUPPLY — 23 items
BALLN ULTRVRSE 2.5X300X150 (BALLOONS) ×2
BALLN ULTRVRSE 2X300X150 (BALLOONS) ×2
BALLN ULTRVRSE 2X300X150 OTW (BALLOONS) ×1
BALLN ULTRVRSE 3X100X150 (BALLOONS) ×2
BALLOON ULTRVRSE 2.5X300X150 (BALLOONS) ×1 IMPLANT
BALLOON ULTRVRSE 2X300X150 OTW (BALLOONS) ×1 IMPLANT
BALLOON ULTRVRSE 3X100X150 (BALLOONS) ×1 IMPLANT
CANISTER PENUMBRA ENGINE (MISCELLANEOUS) ×2 IMPLANT
CATH INDIGO CAT6 KIT (CATHETERS) ×2 IMPLANT
CATH NAVICROSS ANGLED 135CM (MICROCATHETER) ×2 IMPLANT
CATH PIG 70CM (CATHETERS) ×2 IMPLANT
COVER PROBE U/S 5X48 (MISCELLANEOUS) ×2 IMPLANT
DEVICE STARCLOSE SE CLOSURE (Vascular Products) ×2 IMPLANT
GLIDEWIRE ADV .014X300CM (WIRE) ×2 IMPLANT
GLIDEWIRE ADV .035X260CM (WIRE) ×2 IMPLANT
KIT ENCORE 26 ADVANTAGE (KITS) ×2 IMPLANT
PACK ANGIOGRAPHY (CUSTOM PROCEDURE TRAY) ×2 IMPLANT
SHEATH BRITE TIP 5FRX11 (SHEATH) ×2 IMPLANT
SHEATH PINNACLE ST 6F 65CM (SHEATH) ×2 IMPLANT
SYR MEDRAD MARK 7 150ML (SYRINGE) ×2 IMPLANT
TUBING CONTRAST HIGH PRESS 72 (TUBING) ×2 IMPLANT
WIRE G V18X300CM (WIRE) ×2 IMPLANT
WIRE GUIDERIGHT .035X150 (WIRE) ×2 IMPLANT

## 2021-05-20 NOTE — Op Note (Signed)
[name]  Percutaneous Study/Intervention Procedural Note   Date of Surgery: 05/20/2021  Surgeon(s):Abem Shaddix    Assistants:none  Pre-operative Diagnosis: PAD with ulceration RLE  Post-operative diagnosis:  Same  Procedure(s) Performed:             1.  Ultrasound guidance for vascular access left femoral artery             2.  Catheter placement into right common femoral artery from left femoral approach             3.  Selective right lower extremity angiogram             4.  Mechanical thrombectomy of the right tibioperoneal trunk and the proximal portions of both the posterior tibial and peroneal arteries with the penumbra CAT 6 catheter             5.  Percutaneous transluminal angioplasty of the right posterior tibial artery with a 2 mm diameter by 30 cm length angioplasty Throughout and a 3 mm diameter angioplasty balloon in the proximal portion  6.  Percutaneous transluminal angioplasty of the right peroneal artery with 2.5 mm diameter angioplasty balloon             7.  StarClose closure device left femoral artery  EBL: 100 cc  Contrast: 40 cc  Fluoro Time: 11.3 minutes  Moderate Conscious Sedation Time: approximately 69 minutes using 3 mg of Versed and 100 mcg of Fentanyl              Indications:  Patient is a 35 y.o.female with severe peripheral arterial disease multiple previous interventions on both legs. The patient has noninvasive study showing reduced perfusion in the right foot with his nonhealing wound. The patient is brought in for angiography for further evaluation and potential treatment.  Due to the limb threatening nature of the situation, angiogram was performed for attempted limb salvage. The patient is aware that if the procedure fails, amputation would be expected.  The patient also understands that even with successful revascularization, amputation may still be required due to the severity of the situation.  Risks and benefits are  discussed and informed consent is obtained.   Procedure:  The patient was identified and appropriate procedural time out was performed.  The patient was then placed supine on the table and prepped and draped in the usual sterile fashion. Moderate conscious sedation was administered during a face to face encounter with the patient throughout the procedure with my supervision of the RN administering medicines and monitoring the patient's vital signs, pulse oximetry, telemetry and mental status throughout from the start of the procedure until the patient was taken to the recovery room. Ultrasound was used to evaluate the left common femoral artery.  It was patent .  A digital ultrasound image was acquired.  A Seldinger needle was used to access the left common femoral artery under direct ultrasound guidance and a permanent image was performed.  A 0.035 J wire was advanced without resistance and a 5Fr sheath was placed.   I then crossed the aortic bifurcation and advanced to the right femoral head. Selective right lower extremity angiogram was then performed. This demonstrated normal common femoral artery, profunda femorus, SFA and popliteal arteries.  There was then severe tibial disease.  The anterior tibial artery was small and occluded without distal reconstitution.  There was what appeared to be thrombus in the tibioperoneal trunk going in the proximal portions of the posterior  tibial and peroneal arteries.  There were then multiple segments of occlusion in both the posterior tibial and peroneal arteries throughout their course but there was reconstitution with flow into the foot through both vessels.. It was felt that it was in the patient's best interest to proceed with intervention after these images to avoid a second procedure and a larger amount of contrast and fluoroscopy based off of the findings from the initial angiogram. The patient was systemically heparinized and a 6 [place] destination sheath was then  placed over the Terumo Advantage wire. I then used a Kumpe catheter and the advantage wire to get down into the posterior tibial artery initially and then used the penumbra CAT 6 catheter to treat the tibioperoneal trunk and proximal portion of the posterior tibial artery to treat thrombus after exchanged for a V 18 wire.  This did result in improvement some of the thrombus although there still remained a significant amount the tibioperoneal trunk and the proximal peroneal artery.  I went ahead and treated the posterior tibial artery with a 2 mm diameter by 30 cm length angioplasty balloon all the way into the proximal foot inflating this to 12 atm for 1 minute.  Improvement was seen with multiple areas of occlusion being resolved with less than 30% residual stenosis although the proximal portion remain highly stenotic.  I then got down into the peroneal artery with a V 18 wire and used the penumbra CAT 6 catheter in the proximal peroneal artery and tibioperoneal trunk but there remained multiple areas of stenosis and occlusion as well as some thrombus in the proximal peroneal artery.  A 2.5 mm diameter by 30 cm length angioplasty balloon was inflated to 10 atm for 1 minute in the peroneal artery.  There was then marked improvement with this in the peroneal artery and distal tibioperoneal trunk, but now the posterior tibial artery was nearly occluded at its origin.  I then used a buddy wire and used an advantage 0.014 wire and got down into the posterior tibial artery and then treated the proximal portion of the posterior tibial artery with a 3 mm diameter by 10 cm length angioplasty balloon inflated to 12 atm for 1 minute.  Completion imaging showed less than 20% residual stenosis in the posterior tibial artery.  There remained some thrombus and stenosis in the peroneal artery but the posterior tibial artery was the better runoff distally so we elected to leave this in place for fear of harming the posterior tibial  artery. I elected to terminate the procedure. The sheath was removed and StarClose closure device was deployed in the left femoral artery with excellent hemostatic result. The patient was taken to the recovery room in stable condition having tolerated the procedure well.  Findings:                            Right Lower Extremity:  normal common femoral artery, profunda femorus, SFA and popliteal arteries.  There was then severe tibial disease.  The anterior tibial artery was small and occluded without distal reconstitution.  There was what appeared to be thrombus in the tibioperoneal trunk going in the proximal portions of the posterior tibial and peroneal arteries.  There were then multiple segments of occlusion in both the posterior tibial and peroneal arteries throughout their course but there was reconstitution with flow into the foot through both vessels.   Disposition: Patient was taken to the recovery room in  stable condition having tolerated the procedure well.  Complications: None  [name] 05/20/2021 11:08 AM   This note was created with Dragon Medical transcription system. Any errors in dictation are purely unintentional.

## 2021-05-20 NOTE — Interval H&P Note (Signed)
History and Physical Interval Note:  05/20/2021 9:45 AM  [name]  has presented today for surgery, with the diagnosis of RLE Angio  ASO w ulceration.  The various methods of treatment have been discussed with the patient and family. After consideration of risks, benefits and other options for treatment, the patient has consented to  Procedure(s): LOWER EXTREMITY ANGIOGRAPHY (Right) as a surgical intervention.  The patient's history has been reviewed, patient examined, no change in status, stable for surgery.  I have reviewed the patient's chart and labs.  Questions were answered to the patient's satisfaction.     [name]

## 2021-05-25 ENCOUNTER — Telehealth (INDEPENDENT_AMBULATORY_CARE_PROVIDER_SITE_OTHER): Payer: Self-pay

## 2021-05-26 ENCOUNTER — Ambulatory Visit (INDEPENDENT_AMBULATORY_CARE_PROVIDER_SITE_OTHER): Payer: Medicare Other | Admitting: Podiatry

## 2021-05-26 ENCOUNTER — Other Ambulatory Visit: Payer: Self-pay

## 2021-05-26 DIAGNOSIS — I96 Gangrene, not elsewhere classified: Secondary | ICD-10-CM

## 2021-05-26 DIAGNOSIS — Z992 Dependence on renal dialysis: Secondary | ICD-10-CM

## 2021-05-26 DIAGNOSIS — N186 End stage renal disease: Secondary | ICD-10-CM

## 2021-05-26 DIAGNOSIS — L97522 Non-pressure chronic ulcer of other part of left foot with fat layer exposed: Secondary | ICD-10-CM | POA: Diagnosis not present

## 2021-05-26 DIAGNOSIS — I739 Peripheral vascular disease, unspecified: Secondary | ICD-10-CM

## 2021-05-26 NOTE — Progress Notes (Signed)
  Subjective:  Patient ID: [name], female    DOB: 26-Nov-1985,  MRN: [id number]  Follow-up ulcers and toe amputation   DOS: 01/01/21 Procedure: amputation L 5th toe  35 y.o. female returns for post-op check.  Unfortunately continues to worsen her ulceration has worsened on the right side she had new angiography last week with Dr [name] on the R side and undergoes the same tomorrow on the L side. Toes on the left are worsening as well.   Review of Systems: Negative except as noted in the HPI. Denies N/V/F/Ch.   Objective:   There were no vitals filed for this visit. There is no height or weight on file to calculate BMI. Constitutional Well developed. Well nourished.  Vascular  left foot is warm well perfused.  The right foot is cool to touch  Neurologic Normal speech. Oriented to person, place, and time. Epicritic sensation to light touch grossly decreased bilaterally.  Dermatologic Left foot gangrene of partial toes on toes 2 and 4, toe 3 has early gangrenous changes, open ulcer from 5th toe amp has not healed  Right foot 5th toe is gangrenous, the distal phalanx came off in the dressings and has an open wound exposed with joint   Orthopedic: Tenderness to palpation noted about the surgical site.   I reviewed her R foot angiography she has flow inline through PTA now    Radiographs: s/p amputation left 5th toe at MTPJ  Wound culture with moderate growth of corynebacterium Assessment:   1. Ulcer of left foot with fat layer exposed ([place])   2. Gangrene of toe of right foot ([place])   3. Peripheral arterial disease ([place])   4. ESRD (end stage renal disease) on dialysis ([place])   5. Gangrene of toe of left foot ([place])      Plan:  Patient was evaluated and treated and all questions answered.  Unfortunately has worsened again she'll need 5th toe amputation on the R and multiple toe amputations on the L after revascularizations on the L side tomorrow. We discussed the option of  TMA on the left side which would likely heal better but she and her mother both are hesitant to pursue this which I understand. I would like to see her back in 1 week for re evaluation and plan for surgery for both feet next week or the week after. Continue betadine for now. Will re-visit plan for L foot next week once she has revascularization.  Lanae Crumbly, DPM 05/26/2021      Return in about 1 week (around 06/02/2021) for wound evaluation, surgery planning .

## 2021-05-27 ENCOUNTER — Encounter: Payer: Self-pay | Admitting: Vascular Surgery

## 2021-05-27 ENCOUNTER — Other Ambulatory Visit (INDEPENDENT_AMBULATORY_CARE_PROVIDER_SITE_OTHER): Payer: Self-pay | Admitting: Nurse Practitioner

## 2021-05-27 ENCOUNTER — Encounter
Admission: RE | Disposition: A | Discharge status: Home / Self Care | Payer: Self-pay | Source: Home / Self Care | Attending: Vascular Surgery

## 2021-05-27 ENCOUNTER — Encounter: Payer: Self-pay | Admitting: Hematology and Oncology

## 2021-05-27 ENCOUNTER — Ambulatory Visit
Admission: RE | Admit: 2021-05-27 | Discharge: 2021-05-27 | Disposition: A | Discharge status: Home / Self Care | Payer: Medicare Other | Attending: Vascular Surgery | Admitting: Vascular Surgery

## 2021-05-27 DIAGNOSIS — Z87891 Personal history of nicotine dependence: Secondary | ICD-10-CM | POA: Insufficient documentation

## 2021-05-27 DIAGNOSIS — E1122 Type 2 diabetes mellitus with diabetic chronic kidney disease: Secondary | ICD-10-CM | POA: Diagnosis not present

## 2021-05-27 DIAGNOSIS — S61209S Unspecified open wound of unspecified finger without damage to nail, sequela: Secondary | ICD-10-CM | POA: Diagnosis not present

## 2021-05-27 DIAGNOSIS — Z7982 Long term (current) use of aspirin: Secondary | ICD-10-CM | POA: Diagnosis not present

## 2021-05-27 DIAGNOSIS — E11621 Type 2 diabetes mellitus with foot ulcer: Secondary | ICD-10-CM | POA: Insufficient documentation

## 2021-05-27 DIAGNOSIS — Z7902 Long term (current) use of antithrombotics/antiplatelets: Secondary | ICD-10-CM | POA: Insufficient documentation

## 2021-05-27 DIAGNOSIS — I70262 Atherosclerosis of native arteries of extremities with gangrene, left leg: Secondary | ICD-10-CM | POA: Insufficient documentation

## 2021-05-27 DIAGNOSIS — X58XXXS Exposure to other specified factors, sequela: Secondary | ICD-10-CM | POA: Insufficient documentation

## 2021-05-27 DIAGNOSIS — I509 Heart failure, unspecified: Secondary | ICD-10-CM | POA: Insufficient documentation

## 2021-05-27 DIAGNOSIS — E1152 Type 2 diabetes mellitus with diabetic peripheral angiopathy with gangrene: Secondary | ICD-10-CM | POA: Diagnosis present

## 2021-05-27 DIAGNOSIS — Z79899 Other long term (current) drug therapy: Secondary | ICD-10-CM | POA: Insufficient documentation

## 2021-05-27 DIAGNOSIS — N183 Chronic kidney disease, stage 3 unspecified: Secondary | ICD-10-CM | POA: Insufficient documentation

## 2021-05-27 DIAGNOSIS — L97909 Non-pressure chronic ulcer of unspecified part of unspecified lower leg with unspecified severity: Secondary | ICD-10-CM

## 2021-05-27 DIAGNOSIS — L97529 Non-pressure chronic ulcer of other part of left foot with unspecified severity: Secondary | ICD-10-CM | POA: Diagnosis not present

## 2021-05-27 DIAGNOSIS — I130 Hypertensive heart and chronic kidney disease with heart failure and stage 1 through stage 4 chronic kidney disease, or unspecified chronic kidney disease: Secondary | ICD-10-CM | POA: Diagnosis not present

## 2021-05-27 HISTORY — PX: LOWER EXTREMITY ANGIOGRAPHY: CATH118251

## 2021-05-27 LAB — GLUCOSE, CAPILLARY: Glucose-Capillary: 131 mg/dL — ABNORMAL HIGH (ref 70–99)

## 2021-05-27 LAB — POTASSIUM (ARMC VASCULAR LAB ONLY): Potassium (ARMC vascular lab): 3.5 (ref 3.5–5.1)

## 2021-05-27 SURGERY — LOWER EXTREMITY ANGIOGRAPHY
Anesthesia: Moderate Sedation | Site: Leg Lower | Laterality: Left

## 2021-05-27 MED ORDER — ONDANSETRON HCL 4 MG/2ML IJ SOLN: 4.0000 mg | Freq: Four times a day (QID) | INTRAMUSCULAR | Status: DC | PRN

## 2021-05-27 MED ORDER — CEFAZOLIN SODIUM-DEXTROSE 1-4 GM/50ML-% IV SOLN
1.0000 g | Freq: Once | INTRAVENOUS | Status: AC
  Administered 2021-05-27: 1 g via INTRAVENOUS

## 2021-05-27 MED ORDER — SODIUM CHLORIDE 0.9 % IV SOLN: INTRAVENOUS | Status: DC

## 2021-05-27 MED ORDER — FENTANYL CITRATE (PF) 100 MCG/2ML IJ SOLN
INTRAMUSCULAR | Status: DC | PRN
  Administered 2021-05-27: 50 ug via INTRAVENOUS

## 2021-05-27 MED ORDER — MIDAZOLAM HCL 2 MG/2ML IJ SOLN
INTRAMUSCULAR | Status: DC | PRN
  Administered 2021-05-27: 1 mg via INTRAVENOUS

## 2021-05-27 MED ORDER — FAMOTIDINE 20 MG PO TABS: 40.0000 mg | ORAL_TABLET | Freq: Once | ORAL | Status: DC | PRN

## 2021-05-27 MED ORDER — HEPARIN SODIUM (PORCINE) 1000 UNIT/ML IJ SOLN
INTRAMUSCULAR | Status: AC
  Filled 2021-05-27: qty 1

## 2021-05-27 MED ORDER — HEPARIN SODIUM (PORCINE) 1000 UNIT/ML IJ SOLN
INTRAMUSCULAR | Status: DC | PRN
  Administered 2021-05-27: 5000 [IU] via INTRAVENOUS

## 2021-05-27 MED ORDER — IODIXANOL 320 MG/ML IV SOLN
INTRAVENOUS | Status: DC | PRN
  Administered 2021-05-27: 40 mL

## 2021-05-27 MED ORDER — DIPHENHYDRAMINE HCL 50 MG/ML IJ SOLN: 50.0000 mg | Freq: Once | INTRAMUSCULAR | Status: DC | PRN

## 2021-05-27 MED ORDER — METHYLPREDNISOLONE SODIUM SUCC 125 MG IJ SOLR: 125.0000 mg | Freq: Once | INTRAMUSCULAR | Status: DC | PRN

## 2021-05-27 MED ORDER — FENTANYL CITRATE (PF) 100 MCG/2ML IJ SOLN
INTRAMUSCULAR | Status: AC
  Filled 2021-05-27: qty 2

## 2021-05-27 MED ORDER — HYDROMORPHONE HCL 1 MG/ML IJ SOLN: 1.0000 mg | Freq: Once | INTRAMUSCULAR | Status: DC | PRN

## 2021-05-27 MED ORDER — MIDAZOLAM HCL 2 MG/ML PO SYRP: 8.0000 mg | ORAL_SOLUTION | Freq: Once | ORAL | Status: DC | PRN

## 2021-05-27 MED ORDER — MIDAZOLAM HCL 5 MG/5ML IJ SOLN
INTRAMUSCULAR | Status: AC
  Filled 2021-05-27: qty 5

## 2021-05-27 SURGICAL SUPPLY — 22 items
BALLN DORADO 6X40X80 (BALLOONS) ×2
BALLN LUTONIX 018 5X60X130 (BALLOONS) ×2
BALLN ULTRVRSE 018 2.5X150X150 (BALLOONS) ×2
BALLN ULTRVRSE 2X150X150 (BALLOONS) ×2
BALLN ULTRVRSE 2X150X150 OTW (BALLOONS) ×1
BALLOON DORADO 6X40X80 (BALLOONS) ×1 IMPLANT
BALLOON LUTONIX 018 5X60X130 (BALLOONS) ×1 IMPLANT
BALLOON ULTRVRSE 2X150X150 OTW (BALLOONS) ×1 IMPLANT
BALLOON ULTRVS 018 2.5X150X150 (BALLOONS) ×1 IMPLANT
CATH BEACON 5 .035 65 RIM TIP (CATHETERS) ×2 IMPLANT
CATH VERT 5X100 (CATHETERS) ×2 IMPLANT
DEVICE STARCLOSE SE CLOSURE (Vascular Products) ×2 IMPLANT
GLIDEWIRE ADV .035X260CM (WIRE) ×2 IMPLANT
KIT ENCORE 26 ADVANTAGE (KITS) ×2 IMPLANT
PACK ANGIOGRAPHY (CUSTOM PROCEDURE TRAY) ×2 IMPLANT
SHEATH BRITE TIP 5FRX11 (SHEATH) ×2 IMPLANT
SHEATH RAABE 6FRX70 (SHEATH) ×2 IMPLANT
STENT LIFESTENT 5F 6X60X135 (Permanent Stent) ×2 IMPLANT
SYR MEDRAD MARK 7 150ML (SYRINGE) ×2 IMPLANT
TUBING CONTRAST HIGH PRESS 72 (TUBING) ×2 IMPLANT
WIRE G V18X300CM (WIRE) ×2 IMPLANT
WIRE GUIDERIGHT .035X150 (WIRE) ×2 IMPLANT

## 2021-05-27 NOTE — Op Note (Signed)
[facility]  Percutaneous Study/Intervention Procedural Note   Date of Surgery: 05/27/2021  Surgeon(s):Zakhi Dupre    Assistants:none  Pre-operative Diagnosis: PAD with gangrenous changes left lower extremity  Post-operative diagnosis:  Same  Procedure(s) Performed:             1.  Ultrasound guidance for vascular access right femoral artery             2.  Catheter placement into left common femoral artery from right femoral approach             3.  Selective left lower extremity angiogram             4.  Percutaneous transluminal angioplasty of left peroneal artery with 2 mm diameter angioplasty balloon in the mid and distal segment and 2.5 mm diameter angioplasty balloon in the proximal segment             5.  Life stent placement to the left distal SFA/above-knee popliteal artery near Hunter's canal with 6 mm diameter by 6 cm length stent  6.  StarClose closure device right femoral artery  EBL: 5 cc  Contrast: 40 cc  Fluoro Time: 6.7 minutes  Moderate Conscious Sedation Time: approximately 45 minutes using 1 mg of Versed and 50 mcg of Fentanyl              Indications:  Patient is a 35 y.o.female with gangrenous left toes and a long history of PAD with previous interventions. The patient has noninvasive study showing reduced perfusion in both feet. The patient is brought in for angiography for further evaluation and potential treatment.  Due to the limb threatening nature of the situation, angiogram was performed for attempted limb salvage. The patient is aware that if the procedure fails, amputation would be expected.  The patient also understands that even with successful revascularization, amputation may still be required due to the severity of the situation.  Risks and benefits are discussed and informed consent is obtained.   Procedure:  The patient was identified and appropriate procedural time out was performed.  The patient was then placed supine on  the table and prepped and draped in the usual sterile fashion. Moderate conscious sedation was administered during a face to face encounter with the patient throughout the procedure with my supervision of the RN administering medicines and monitoring the patient's vital signs, pulse oximetry, telemetry and mental status throughout from the start of the procedure until the patient was taken to the recovery room. Ultrasound was used to evaluate the right common femoral artery.  It was patent .  A digital ultrasound image was acquired.  A Seldinger needle was used to access the right common femoral artery under direct ultrasound guidance and a permanent image was performed.  A 0.035 J wire was advanced without resistance and a 5Fr sheath was placed.  Aortogram was not needed as 1 has been done recently and there was no proximal disease.  I then crossed the aortic bifurcation and advanced to the left femoral head. Selective left lower extremity angiogram was then performed. This demonstrated normal common femoral artery, profunda femoris artery, and proximal and mid superficial femoral arteries.  In the distal SFA and proximal popliteal artery at Hunter's canal was an area of significant narrowing in the 70 to 80% range.  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  The vessel normalized beyond this and the previously placed stent in the below-knee popliteal artery is  widely patent.  There was a typical tibial trifurcation.  The posterior tibial artery was continuous in the foot although there was small vessel disease within the foot although flow to the mid to distal foot was good, the flow into the toes was very sluggish.  The peroneal artery was small with 2 areas of occlusion in the proximal segment in the mid segment.  The anterior tibial artery occluded proximally and did not have any distal reconstitution. It was felt that it was in the patient's best interest to proceed with intervention after  these images to avoid a second procedure and a larger amount of contrast and fluoroscopy based off of the findings from the initial angiogram. The patient was systemically heparinized and a 6 French 70 cm sheath was then placed over the Terumo Advantage wire. I then used a Kumpe catheter and the advantage wire to navigate through the Hunter's canal lesion and get down into the proximal peroneal artery where I exchanged for a V 18 wire which was used to cross the lesions and parked just above the ankle.  A 2.5 mm diameter by 15 cm length angioplasty balloon was employed in the tibioperoneal trunk and proximal peroneal artery and inflated up to 12 atm for 1 minute.  This would not pass further down due to the severity and highly calcific nature of the disease so I exchanged for a 2 mm diameter by 15 cm length angioplasty balloon that was used to treat the mid segment down to the mid to distal segment of the peroneal artery inflating to 10 atm for 1 minute.  The areas treated were markedly improved with less than 20% residual stenosis, but this still had very sluggish flow distally with the posterior tibial artery being the dominant runoff to the foot.  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  A 6 mm diameter by 6 cm length life stent was used as there were large collaterals in the area that I wanted to preserve.  This was deployed encompassing the lesion in's entirety and then was postdilated to 5 mm diameter by 6 cm length Lutonix drug-coated angioplasty balloon but there was still some residual narrowing so upsized to a 6 mm diameter by 4 cm length high-pressure angioplasty balloon and following this there is less than 10% residual stenosis. I elected to terminate the procedure. The sheath was removed and StarClose closure device was deployed in the right femoral artery with excellent hemostatic result. The patient was taken to the recovery room in stable condition  having tolerated the procedure well.  Findings:               Left lower Extremity: Normal common femoral artery, profunda femoris artery, and proximal and mid superficial femoral arteries.  In the distal SFA and proximal popliteal artery at Hunter's canal was an area of significant narrowing in the 70 to 80% range.  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  The vessel normalized beyond this and the previously placed stent in the below-knee popliteal artery is widely patent.  There was a typical tibial trifurcation.  The posterior tibial artery was continuous in the foot although there was small vessel disease within the foot although flow to the mid to distal foot was good, the flow into the toes was very sluggish.  The peroneal artery was small with 2 areas of occlusion in the proximal segment in the mid segment.  The anterior  tibial artery occluded proximally and did not have any distal reconstitution.   Disposition: Patient was taken to the recovery room in stable condition having tolerated the procedure well.  Complications: None  [name] 05/27/2021 12:32 PM   This note was created with Dragon Medical transcription system. Any errors in dictation are purely unintentional.

## 2021-05-27 NOTE — Interval H&P Note (Signed)
History and Physical Interval Note:  05/27/2021 10:01 AM  [name]  has presented today for surgery, with the diagnosis of LLE Angio   ASO w ulceration.  The various methods of treatment have been discussed with the patient and family. After consideration of risks, benefits and other options for treatment, the patient has consented to  Procedure(s): LOWER EXTREMITY ANGIOGRAPHY (Left) as a surgical intervention.  The patient's history has been reviewed, patient examined, no change in status, stable for surgery.  I have reviewed the patient's chart and labs.  Questions were answered to the patient's satisfaction.     [name]

## 2021-05-28 ENCOUNTER — Encounter: Payer: Self-pay | Admitting: Vascular Surgery

## 2021-05-31 ENCOUNTER — Encounter (INDEPENDENT_AMBULATORY_CARE_PROVIDER_SITE_OTHER): Payer: Self-pay

## 2021-05-31 ENCOUNTER — Telehealth (INDEPENDENT_AMBULATORY_CARE_PROVIDER_SITE_OTHER): Payer: Self-pay

## 2021-05-31 NOTE — Telephone Encounter (Signed)
Patient has been schedule for ligation of the left arm av fistula on 06/03/21 with Dr [name]. Pre-op will contact patient on 8/17 between 8-1 pm. Patient has been made aware with information.

## 2021-06-02 ENCOUNTER — Ambulatory Visit: Payer: Medicare Other | Admitting: Podiatry

## 2021-06-02 ENCOUNTER — Encounter
Admission: RE | Admit: 2021-06-02 | Discharge: 2021-06-02 | Disposition: A | Discharge status: Home / Self Care | Payer: Medicare Other | Source: Ambulatory Visit | Attending: Vascular Surgery | Admitting: Vascular Surgery

## 2021-06-02 ENCOUNTER — Other Ambulatory Visit: Payer: Self-pay

## 2021-06-02 ENCOUNTER — Encounter: Payer: Self-pay | Admitting: Urgent Care

## 2021-06-02 ENCOUNTER — Other Ambulatory Visit (INDEPENDENT_AMBULATORY_CARE_PROVIDER_SITE_OTHER): Payer: Self-pay | Admitting: Nurse Practitioner

## 2021-06-02 ENCOUNTER — Ambulatory Visit (INDEPENDENT_AMBULATORY_CARE_PROVIDER_SITE_OTHER): Payer: Medicare Other | Admitting: Podiatry

## 2021-06-02 DIAGNOSIS — I96 Gangrene, not elsewhere classified: Secondary | ICD-10-CM | POA: Diagnosis not present

## 2021-06-02 DIAGNOSIS — Z01818 Encounter for other preprocedural examination: Secondary | ICD-10-CM | POA: Insufficient documentation

## 2021-06-02 DIAGNOSIS — N186 End stage renal disease: Secondary | ICD-10-CM | POA: Diagnosis not present

## 2021-06-02 DIAGNOSIS — L97522 Non-pressure chronic ulcer of other part of left foot with fat layer exposed: Secondary | ICD-10-CM

## 2021-06-02 LAB — CBC WITH DIFFERENTIAL/PLATELET
Abs Immature Granulocytes: 0.25 10*3/uL — ABNORMAL HIGH (ref 0.00–0.07)
Basophils Absolute: 0.1 10*3/uL (ref 0.0–0.1)
Basophils Relative: 0 %
Eosinophils Absolute: 0.7 10*3/uL — ABNORMAL HIGH (ref 0.0–0.5)
Eosinophils Relative: 3 %
HCT: 28.7 % — ABNORMAL LOW (ref 36.0–46.0)
Hemoglobin: 9.7 g/dL — ABNORMAL LOW (ref 12.0–15.0)
Immature Granulocytes: 1 %
Lymphocytes Relative: 10 %
Lymphs Abs: 2.1 10*3/uL (ref 0.7–4.0)
MCH: 33 pg (ref 26.0–34.0)
MCHC: 33.8 g/dL (ref 30.0–36.0)
MCV: 97.6 fL (ref 80.0–100.0)
Monocytes Absolute: 1.1 10*3/uL — ABNORMAL HIGH (ref 0.1–1.0)
Monocytes Relative: 5 %
Neutro Abs: 17.2 10*3/uL — ABNORMAL HIGH (ref 1.7–7.7)
Neutrophils Relative %: 81 %
Platelets: 360 10*3/uL (ref 150–400)
RBC: 2.94 MIL/uL — ABNORMAL LOW (ref 3.87–5.11)
RDW: 15.3 % (ref 11.5–15.5)
WBC: 21.4 10*3/uL — ABNORMAL HIGH (ref 4.0–10.5)
nRBC: 0 % (ref 0.0–0.2)

## 2021-06-02 LAB — BASIC METABOLIC PANEL
Anion gap: 20 — ABNORMAL HIGH (ref 5–15)
BUN: 63 mg/dL — ABNORMAL HIGH (ref 6–20)
CO2: 22 mmol/L (ref 22–32)
Calcium: 8 mg/dL — ABNORMAL LOW (ref 8.9–10.3)
Chloride: 95 mmol/L — ABNORMAL LOW (ref 98–111)
Creatinine, Ser: 12.88 mg/dL — ABNORMAL HIGH (ref 0.44–1.00)
GFR, Estimated: 3 mL/min — ABNORMAL LOW (ref >60)
Glucose, Bld: 93 mg/dL (ref 70–99)
Potassium: 3.4 mmol/L — ABNORMAL LOW (ref 3.5–5.1)
Sodium: 137 mmol/L (ref 135–145)

## 2021-06-02 LAB — APTT: aPTT: 32 seconds (ref 24–36)

## 2021-06-02 LAB — PROTIME-INR
INR: 1.1 (ref 0.8–1.2)
Prothrombin Time: 14.4 seconds (ref 11.4–15.2)

## 2021-06-02 LAB — TYPE AND SCREEN
ABO/RH(D): A POS
Antibody Screen: NEGATIVE

## 2021-06-02 MED ORDER — CLINDAMYCIN HCL 300 MG PO CAPS: 300.0000 mg | ORAL_CAPSULE | Freq: Three times a day (TID) | ORAL | 1 refills | Status: DC

## 2021-06-02 NOTE — Patient Instructions (Addendum)
Your procedure is scheduled on: Thursday, August 18 Report to the Registration Desk on the 1st floor of the [facility]. To find out your arrival time, please call [phone] between 1PM - 3PM on: Wednesday, August 17  REMEMBER: Instructions that are not followed completely may result in serious medical risk, up to and including death; or upon the discretion of your surgeon and anesthesiologist your surgery may need to be rescheduled.  Do not eat food after midnight the night before surgery.  No gum chewing, lozengers or hard candies.  You may however, drink water up to 2 hours before you are scheduled to arrive for your surgery. Do not drink anything within 2 hours of your scheduled arrival time.  TAKE THESE MEDICATIONS THE MORNING OF SURGERY WITH A SIP OF WATER:  Amlodipine Atorvastatin Carvedilol Clonidine Isosorbide mononitrate  Do not take aspirin, plavix, cilostazol (Pletal) on the day of surgery.  One week prior to surgery: Stop Anti-inflammatories (NSAIDS) such as Advil, Aleve, Ibuprofen, Motrin, Naproxen, Naprosyn and Aspirin based products such as Excedrin, Goodys Powder, BC Powder. Stop ANY OVER THE COUNTER supplements until after surgery. You may however, continue to take Tylenol if needed for pain up until the day of surgery.  No Alcohol for 24 hours before or after surgery.  No Smoking including e-cigarettes for 24 hours prior to surgery.  No chewable tobacco products for at least 6 hours prior to surgery.  No nicotine patches on the day of surgery.  Do not use any "recreational" drugs for at least a week prior to your surgery.  Please be advised that the combination of cocaine and anesthesia may have negative outcomes, up to and including death. If you test positive for cocaine, your surgery will be cancelled.  On the morning of surgery brush your teeth with toothpaste and water, you may rinse your mouth with mouthwash if you wish. Do not swallow any  toothpaste or mouthwash.  Do not wear jewelry, make-up, hairpins, clips or nail polish.  Do not wear lotions, powders, or perfumes.   Do not shave body from the neck down 48 hours prior to surgery just in case you cut yourself which could leave a site for infection.  Also, freshly shaved skin may become irritated if using the CHG soap.  Contact lenses, hearing aids and dentures may not be worn into surgery.  Do not bring valuables to the hospital. [facility] is not responsible for any missing/lost belongings or valuables.   Use CHG Soap as directed on instruction sheet.  Notify your doctor if there is any change in your medical condition (cold, fever, infection).  Wear comfortable clothing (specific to your surgery type) to the hospital.  After surgery, you can help prevent lung complications by doing breathing exercises.  Take deep breaths and cough every 1-2 hours. Your doctor may order a device called an Incentive Spirometer to help you take deep breaths.  If you are being discharged the day of surgery, you will not be allowed to drive home. You will need a responsible adult (18 years or older) to drive you home and stay with you that night.   If you are taking public transportation, you will need to have a responsible adult (18 years or older) with you. Please confirm with your physician that it is acceptable to use public transportation.   Please call the [facility] Dept. at [phone] if you have any questions about these instructions.  Surgery Visitation Policy:  Patients undergoing a  surgery or procedure may have one family member or support person with them as long as that person is not COVID-19 positive or experiencing its symptoms.  That person may remain in the waiting area during the procedure.

## 2021-06-03 ENCOUNTER — Ambulatory Visit
Admission: RE | Admit: 2021-06-03 | Discharge: 2021-06-03 | Disposition: A | Discharge status: Home / Self Care | Payer: Medicare Other | Attending: Vascular Surgery | Admitting: Vascular Surgery

## 2021-06-03 ENCOUNTER — Encounter: Payer: Self-pay | Admitting: Vascular Surgery

## 2021-06-03 ENCOUNTER — Encounter
Admission: RE | Disposition: A | Discharge status: Home / Self Care | Payer: Self-pay | Source: Home / Self Care | Attending: Vascular Surgery

## 2021-06-03 ENCOUNTER — Ambulatory Visit: Payer: Medicare Other | Admitting: Anesthesiology

## 2021-06-03 ENCOUNTER — Other Ambulatory Visit: Payer: Self-pay

## 2021-06-03 ENCOUNTER — Other Ambulatory Visit (INDEPENDENT_AMBULATORY_CARE_PROVIDER_SITE_OTHER): Payer: Self-pay | Admitting: Nurse Practitioner

## 2021-06-03 DIAGNOSIS — I509 Heart failure, unspecified: Secondary | ICD-10-CM | POA: Insufficient documentation

## 2021-06-03 DIAGNOSIS — Y832 Surgical operation with anastomosis, bypass or graft as the cause of abnormal reaction of the patient, or of later complication, without mention of misadventure at the time of the procedure: Secondary | ICD-10-CM | POA: Diagnosis not present

## 2021-06-03 DIAGNOSIS — I130 Hypertensive heart and chronic kidney disease with heart failure and stage 1 through stage 4 chronic kidney disease, or unspecified chronic kidney disease: Secondary | ICD-10-CM | POA: Diagnosis not present

## 2021-06-03 DIAGNOSIS — I70245 Atherosclerosis of native arteries of left leg with ulceration of other part of foot: Secondary | ICD-10-CM | POA: Insufficient documentation

## 2021-06-03 DIAGNOSIS — N183 Chronic kidney disease, stage 3 unspecified: Secondary | ICD-10-CM | POA: Insufficient documentation

## 2021-06-03 DIAGNOSIS — Z95820 Peripheral vascular angioplasty status with implants and grafts: Secondary | ICD-10-CM | POA: Insufficient documentation

## 2021-06-03 DIAGNOSIS — E1151 Type 2 diabetes mellitus with diabetic peripheral angiopathy without gangrene: Secondary | ICD-10-CM | POA: Diagnosis not present

## 2021-06-03 DIAGNOSIS — Z79899 Other long term (current) drug therapy: Secondary | ICD-10-CM | POA: Diagnosis not present

## 2021-06-03 DIAGNOSIS — N186 End stage renal disease: Secondary | ICD-10-CM | POA: Diagnosis not present

## 2021-06-03 DIAGNOSIS — T82898A Other specified complication of vascular prosthetic devices, implants and grafts, initial encounter: Secondary | ICD-10-CM | POA: Insufficient documentation

## 2021-06-03 DIAGNOSIS — Z7902 Long term (current) use of antithrombotics/antiplatelets: Secondary | ICD-10-CM | POA: Insufficient documentation

## 2021-06-03 DIAGNOSIS — E1122 Type 2 diabetes mellitus with diabetic chronic kidney disease: Secondary | ICD-10-CM | POA: Diagnosis not present

## 2021-06-03 DIAGNOSIS — L97529 Non-pressure chronic ulcer of other part of left foot with unspecified severity: Secondary | ICD-10-CM | POA: Insufficient documentation

## 2021-06-03 DIAGNOSIS — Z7982 Long term (current) use of aspirin: Secondary | ICD-10-CM | POA: Diagnosis not present

## 2021-06-03 DIAGNOSIS — L98499 Non-pressure chronic ulcer of skin of other sites with unspecified severity: Secondary | ICD-10-CM | POA: Insufficient documentation

## 2021-06-03 DIAGNOSIS — I9788 Other intraoperative complications of the circulatory system, not elsewhere classified: Secondary | ICD-10-CM | POA: Diagnosis not present

## 2021-06-03 HISTORY — PX: LIGATION OF ARTERIOVENOUS  FISTULA: SHX5948

## 2021-06-03 LAB — POCT I-STAT, CHEM 8
BUN: 50 mg/dL — ABNORMAL HIGH (ref 6–20)
Calcium, Ion: 0.96 mmol/L — ABNORMAL LOW (ref 1.15–1.40)
Chloride: 98 mmol/L (ref 98–111)
Creatinine, Ser: 14 mg/dL — ABNORMAL HIGH (ref 0.44–1.00)
Glucose, Bld: 122 mg/dL — ABNORMAL HIGH (ref 70–99)
HCT: 29 % — ABNORMAL LOW (ref 36.0–46.0)
Hemoglobin: 9.9 g/dL — ABNORMAL LOW (ref 12.0–15.0)
Potassium: 3.6 mmol/L (ref 3.5–5.1)
Sodium: 138 mmol/L (ref 135–145)
TCO2: 24 mmol/L (ref 22–32)

## 2021-06-03 LAB — GLUCOSE, CAPILLARY: Glucose-Capillary: 114 mg/dL — ABNORMAL HIGH (ref 70–99)

## 2021-06-03 SURGERY — LIGATION OF ARTERIOVENOUS  FISTULA
Anesthesia: General | Laterality: Left

## 2021-06-03 MED ORDER — MIDAZOLAM HCL 2 MG/2ML IJ SOLN
INTRAMUSCULAR | Status: DC | PRN
  Administered 2021-06-03: 2 mg via INTRAVENOUS

## 2021-06-03 MED ORDER — BUPIVACAINE-EPINEPHRINE (PF) 0.5% -1:200000 IJ SOLN
INTRAMUSCULAR | Status: DC | PRN
  Administered 2021-06-03: 20 mL

## 2021-06-03 MED ORDER — OXYCODONE-ACETAMINOPHEN 5-325 MG PO TABS: 1.0000 | ORAL_TABLET | ORAL | 0 refills | Status: DC | PRN

## 2021-06-03 MED ORDER — CEFAZOLIN SODIUM-DEXTROSE 1-4 GM/50ML-% IV SOLN
INTRAVENOUS | Status: AC
  Filled 2021-06-03: qty 50

## 2021-06-03 MED ORDER — PROPOFOL 10 MG/ML IV BOLUS
INTRAVENOUS | Status: DC | PRN
  Administered 2021-06-03: 50 mg via INTRAVENOUS
  Administered 2021-06-03: 40 mg via INTRAVENOUS

## 2021-06-03 MED ORDER — FENTANYL CITRATE (PF) 100 MCG/2ML IJ SOLN
INTRAMUSCULAR | Status: AC
  Filled 2021-06-03: qty 2

## 2021-06-03 MED ORDER — CHLORHEXIDINE GLUCONATE 0.12 % MT SOLN
OROMUCOSAL | Status: AC
  Administered 2021-06-03: 15 mL via OROMUCOSAL
  Filled 2021-06-03: qty 15

## 2021-06-03 MED ORDER — OXYCODONE HCL 5 MG/5ML PO SOLN: 5.0000 mg | Freq: Once | ORAL | Status: DC | PRN

## 2021-06-03 MED ORDER — HYDROMORPHONE HCL 1 MG/ML IJ SOLN: 1.0000 mg | Freq: Once | INTRAMUSCULAR | Status: DC | PRN

## 2021-06-03 MED ORDER — CHLORHEXIDINE GLUCONATE CLOTH 2 % EX PADS: 6.0000 | MEDICATED_PAD | Freq: Once | CUTANEOUS | Status: DC

## 2021-06-03 MED ORDER — FENTANYL CITRATE (PF) 100 MCG/2ML IJ SOLN
INTRAMUSCULAR | Status: DC | PRN
  Administered 2021-06-03: 25 ug via INTRAVENOUS

## 2021-06-03 MED ORDER — HEPARIN SODIUM (PORCINE) 5000 UNIT/ML IJ SOLN
INTRAMUSCULAR | Status: AC
  Filled 2021-06-03: qty 1

## 2021-06-03 MED ORDER — 0.9 % SODIUM CHLORIDE (POUR BTL) OPTIME
TOPICAL | Status: DC | PRN
  Administered 2021-06-03: 500 mL

## 2021-06-03 MED ORDER — ORAL CARE MOUTH RINSE: 15.0000 mL | Freq: Once | OROMUCOSAL | Status: AC

## 2021-06-03 MED ORDER — FENTANYL CITRATE (PF) 100 MCG/2ML IJ SOLN: 25.0000 ug | INTRAMUSCULAR | Status: DC | PRN

## 2021-06-03 MED ORDER — PROPOFOL 10 MG/ML IV BOLUS
INTRAVENOUS | Status: AC
  Filled 2021-06-03: qty 20

## 2021-06-03 MED ORDER — ONDANSETRON HCL 4 MG/2ML IJ SOLN: 4.0000 mg | Freq: Four times a day (QID) | INTRAMUSCULAR | Status: DC | PRN

## 2021-06-03 MED ORDER — CHLORHEXIDINE GLUCONATE 0.12 % MT SOLN: 15.0000 mL | Freq: Once | OROMUCOSAL | Status: AC

## 2021-06-03 MED ORDER — FAMOTIDINE 20 MG PO TABS: 20.0000 mg | ORAL_TABLET | Freq: Once | ORAL | Status: AC

## 2021-06-03 MED ORDER — ACETAMINOPHEN 10 MG/ML IV SOLN: 1000.0000 mg | Freq: Once | INTRAVENOUS | Status: DC | PRN

## 2021-06-03 MED ORDER — OXYCODONE HCL 5 MG PO TABS: 5.0000 mg | ORAL_TABLET | Freq: Once | ORAL | Status: DC | PRN

## 2021-06-03 MED ORDER — CEFAZOLIN SODIUM-DEXTROSE 1-4 GM/50ML-% IV SOLN
1.0000 g | INTRAVENOUS | Status: AC
  Administered 2021-06-03: 1 g via INTRAVENOUS

## 2021-06-03 MED ORDER — PROPOFOL 500 MG/50ML IV EMUL
INTRAVENOUS | Status: DC | PRN
  Administered 2021-06-03: 100 ug/kg/min via INTRAVENOUS

## 2021-06-03 MED ORDER — SODIUM CHLORIDE 0.9 % IV SOLN: INTRAVENOUS | Status: DC

## 2021-06-03 MED ORDER — BUPIVACAINE-EPINEPHRINE (PF) 0.5% -1:200000 IJ SOLN
INTRAMUSCULAR | Status: AC
  Filled 2021-06-03: qty 30

## 2021-06-03 MED ORDER — ONDANSETRON HCL 4 MG/2ML IJ SOLN: 4.0000 mg | Freq: Once | INTRAMUSCULAR | Status: DC | PRN

## 2021-06-03 MED ORDER — MIDAZOLAM HCL 2 MG/2ML IJ SOLN
INTRAMUSCULAR | Status: AC
  Filled 2021-06-03: qty 2

## 2021-06-03 MED ORDER — FAMOTIDINE 20 MG PO TABS
ORAL_TABLET | ORAL | Status: AC
  Administered 2021-06-03: 20 mg via ORAL
  Filled 2021-06-03: qty 1

## 2021-06-03 SURGICAL SUPPLY — 53 items
ADH SKN CLS APL DERMABOND .7 (GAUZE/BANDAGES/DRESSINGS) ×1
APL PRP STRL LF DISP 70% ISPRP (MISCELLANEOUS) ×1
BAG DECANTER FOR FLEXI CONT (MISCELLANEOUS) IMPLANT
BLADE SURG SZ11 CARB STEEL (BLADE) ×2 IMPLANT
BOOT SUTURE AID YELLOW STND (SUTURE) IMPLANT
CANISTER SUCT 1200ML W/VALVE (MISCELLANEOUS) ×2 IMPLANT
CHLORAPREP W/TINT 26 (MISCELLANEOUS) ×2 IMPLANT
CLIP SPRNG 6MM S-JAW DBL (CLIP)
DERMABOND ADVANCED (GAUZE/BANDAGES/DRESSINGS) ×1
DERMABOND ADVANCED .7 DNX12 (GAUZE/BANDAGES/DRESSINGS) ×1 IMPLANT
ELECT CAUTERY BLADE 6.4 (BLADE) ×2 IMPLANT
ELECT REM PT RETURN 9FT ADLT (ELECTROSURGICAL) ×2
ELECTRODE REM PT RTRN 9FT ADLT (ELECTROSURGICAL) ×1 IMPLANT
GAUZE 4X4 16PLY ~~LOC~~+RFID DBL (SPONGE) ×2 IMPLANT
GLOVE SURG ENC MOIS LTX SZ7 (GLOVE) ×2 IMPLANT
GLOVE SURG SYN 7.0 (GLOVE) ×2 IMPLANT
GLOVE SURG UNDER LTX SZ7.5 (GLOVE) ×2 IMPLANT
GOWN STRL REUS W/ TWL LRG LVL3 (GOWN DISPOSABLE) ×1 IMPLANT
GOWN STRL REUS W/ TWL XL LVL3 (GOWN DISPOSABLE) ×1 IMPLANT
GOWN STRL REUS W/TWL LRG LVL3 (GOWN DISPOSABLE) ×2
GOWN STRL REUS W/TWL XL LVL3 (GOWN DISPOSABLE) ×2
HEMOSTAT SURGICEL 2X3 (HEMOSTASIS) IMPLANT
IV NS 500ML (IV SOLUTION) ×2
IV NS 500ML BAXH (IV SOLUTION) ×1 IMPLANT
LABEL OR SOLS (LABEL) IMPLANT
LOOP RED MAXI  1X406MM (MISCELLANEOUS)
LOOP VESSEL MAXI  1X406 RED (MISCELLANEOUS)
LOOP VESSEL MAXI 1X406 RED (MISCELLANEOUS) IMPLANT
LOOP VESSEL MINI 0.8X406 BLUE (MISCELLANEOUS) IMPLANT
LOOPS BLUE MINI 0.8X406MM (MISCELLANEOUS)
MANIFOLD NEPTUNE II (INSTRUMENTS) ×2 IMPLANT
NEEDLE FILTER BLUNT 18X 1/2SAF (NEEDLE)
NEEDLE FILTER BLUNT 18X1 1/2 (NEEDLE) IMPLANT
NS IRRIG 500ML POUR BTL (IV SOLUTION) ×2 IMPLANT
PACK EXTREMITY ARMC (MISCELLANEOUS) ×2 IMPLANT
PAD PREP 24X41 OB/GYN DISP (PERSONAL CARE ITEMS) ×2 IMPLANT
SOLUTION CELL SAVER (CLIP) IMPLANT
STOCKINETTE STRL 4IN 9604848 (GAUZE/BANDAGES/DRESSINGS) ×2 IMPLANT
SUT MNCRL AB 4-0 PS2 18 (SUTURE) ×2 IMPLANT
SUT PROLENE 6 0 BV (SUTURE) IMPLANT
SUT SILK 0 (SUTURE) ×2
SUT SILK 0 30XBRD TIE 6 (SUTURE) ×1 IMPLANT
SUT SILK 2 0 (SUTURE)
SUT SILK 2 0 SH (SUTURE) ×2 IMPLANT
SUT SILK 2-0 18XBRD TIE 12 (SUTURE) IMPLANT
SUT SILK 3 0 (SUTURE)
SUT SILK 3-0 18XBRD TIE 12 (SUTURE) IMPLANT
SUT SILK 4 0 (SUTURE)
SUT SILK 4-0 18XBRD TIE 12 (SUTURE) IMPLANT
SUT VIC AB 3-0 SH 27 (SUTURE) ×2
SUT VIC AB 3-0 SH 27X BRD (SUTURE) ×1 IMPLANT
SYR 20ML LL LF (SYRINGE) ×2 IMPLANT
SYR 3ML LL SCALE MARK (SYRINGE) ×2 IMPLANT

## 2021-06-03 NOTE — Interval H&P Note (Signed)
History and Physical Interval Note:  06/03/2021 11:54 AM  [name]  has presented today for surgery, with the diagnosis of End Stage Renal Disease.  The various methods of treatment have been discussed with the patient and family.  Patient has multiple ongoing vascular issues and has had treatment for PAD in both lower extremities in the past several weeks.  Has known steal syndrome in the left arm and has had previous endovascular intervention but still has an ulcer on her fingertip which is worsening.  She is no longer using this fistula and has transition to peritoneal dialysis.  After consideration of risks, benefits and other options for treatment, the patient has consented to  Procedure(s): LIGATION OF ARTERIOVENOUS  FISTULA (Left) as a surgical intervention.  The patient's history has been reviewed, patient examined, no change in status, stable for surgery.  I have reviewed the patient's chart and labs.  Questions were answered to the patient's satisfaction.     [name]

## 2021-06-03 NOTE — OR Nursing (Signed)
Patient states that there is no chance that she is pregnant.  Dr. Bertell Maria explained rationale of testing for pregnancy and risks if she is pregnant.  Patient declines pregnancy testing, states she understands the risks.

## 2021-06-03 NOTE — OR Nursing (Signed)
Unable to obtain urine specimen for POCT UPREG, patient states that she only voids once a day and she did urinate a little bit this morning.

## 2021-06-03 NOTE — Transfer of Care (Signed)
Immediate Anesthesia Transfer of Care Note  Patient: [name]  Procedure(s) Performed: LIGATION OF ARTERIOVENOUS  FISTULA (Left)  Patient Location: PACU  Anesthesia Type:General  Level of Consciousness: awake, alert  and oriented  Airway & Oxygen Therapy: Patient Spontanous Breathing and Patient connected to face mask oxygen  Post-op Assessment: Report given to RN and Post -op Vital signs reviewed and stable  Post vital signs: Reviewed and stable  Last Vitals:  Vitals Value Taken Time  BP 97/63 06/03/21 1333  Temp 36.1 C 06/03/21 1333  Pulse 88 06/03/21 1334  Resp 23 06/03/21 1335  SpO2 98 % 06/03/21 1334  Vitals shown include unvalidated device data.  Last Pain:  Vitals:   06/03/21 1333  TempSrc:   PainSc: 0-No pain         Complications: No notable events documented.

## 2021-06-03 NOTE — Op Note (Signed)
[facility]   OPERATIVE NOTE  DATE: 06/03/2021  PRE-OPERATIVE DIAGNOSIS: ESRD, left hand ulcer, steal syndrome left arm  POST-OPERATIVE DIAGNOSIS: same as above  PROCEDURE: 1.   Ligation of left brachiocephalic AVF  SURGEON: [name], MD  ASSISTANT(S): None  ANESTHESIA: local/MAC  ESTIMATED BLOOD LOSS: minimal  FINDING(S): 1.  none  SPECIMEN(S):  None  INDICATIONS:   Patient is a 35 y.o.female who presents with pain and ulceration in her left hand with steal syndrome and a large patent left brachiocephalic AVF. This is not being used and it needs to be ligated. Risks and benefits were discussed and the patient was agreeable to proceed.  DESCRIPTION: After obtaining full informed written consent, the patient was brought back to the operating room and placed supine upon the operating table.  The patient received IV antibiotics prior to induction.  After obtaining adequate anesthesia, the patient was prepped and draped in the standard fashion. I created a small transverse incision in the mid to distal upper arm overlying the AVF.  The fistula was dissected out.  In that area, a pair of 0 silk ties were used to ligate the AVF.  No further flow was seen proximal to the ligated area. The wound was then irrigated and closed with a 3-0 Vicryl and a 4-0 Monocryl. Dermabond was placed as a dressing. The patient was taken to the recovery room in stable condition having tolerated the procedure well.  COMPLICATIONS: None  CONDITION: Stable   [name] 06/03/2021 1:37 PM  This note was created with Dragon Medical transcription system. Any errors in dictation are purely unintentional.

## 2021-06-03 NOTE — Anesthesia Preprocedure Evaluation (Addendum)
Anesthesia Evaluation  Patient identified by MRN, date of birth, ID band Patient awake    Reviewed: Allergy & Precautions, NPO status , Patient's Chart, lab work & pertinent test results  History of Anesthesia Complications Negative for: history of anesthetic complications  Airway Mallampati: III  TM Distance: >3 FB Neck ROM: Full    Dental no notable dental hx. (+) Teeth Intact   Pulmonary neg pulmonary ROS, neg sleep apnea, neg COPD, Patient abstained from smoking.Not current smoker, former smoker,  Childhood asthma   Pulmonary exam normal breath sounds clear to auscultation       Cardiovascular Exercise Tolerance: Good METShypertension, +CHF  (-) CAD and (-) Past MI (-) dysrhythmias  Rhythm:Regular Rate:Normal - Systolic murmurs TTE 0000000 - Left ventricle: The cavity size was at the upper limits of  normal. There was mild concentric hypertrophy. Systolic function  was normal. The estimated ejection fraction was in the range of  50% to 55%. Wall motion was normal; there were no regional wall  motion abnormalities. Doppler parameters are consistent with  abnormal left ventricular relaxation (grade 1 diastolic  dysfunction).  - Left atrium: The atrium was mildly dilated.  - Right ventricle: Systolic function was normal.  - Pulmonary arteries: Systolic pressure was within the normal  range.  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 No date: CHF  (congestive heart failure) ([place]) No date: CKD (chronic kidney disease), stage III ([place]) No date: Diabetes ([place])     Comment:  type 2 No date: Hypertension No date: Morbid obesity ([place])  Reproductive/Obstetrics                            Anesthesia Physical Anesthesia Plan  ASA: 4  Anesthesia Plan: General   Post-op Pain Management:    Induction: Intravenous  PONV Risk Score and Plan: 3 and Ondansetron, Propofol infusion and TIVA  Airway Management Planned: Nasal Cannula and Natural Airway  Additional Equipment: None  Intra-op Plan:   Post-operative Plan:   Informed Consent: I have reviewed the patients History and Physical, chart, labs and discussed the procedure including the risks, benefits and alternatives for the proposed anesthesia with the patient or authorized representative who has indicated his/her understanding and acceptance.     Dental advisory given  Plan Discussed with: CRNA and Surgeon  Anesthesia Plan Comments: (Discussed risks of anesthesia with patient, including possibility of difficulty with spontaneous ventilation under anesthesia necessitating airway intervention, PONV, and rare risks such as cardiac or respiratory or neurological events, and allergic reactions. Patient understands.)       Anesthesia Quick Evaluation

## 2021-06-03 NOTE — Anesthesia Postprocedure Evaluation (Signed)
Anesthesia Post Note  Patient: [name]  Procedure(s) Performed: LIGATION OF ARTERIOVENOUS  FISTULA (Left)  Patient location during evaluation: PACU Anesthesia Type: General Level of consciousness: awake and alert Pain management: pain level controlled Vital Signs Assessment: post-procedure vital signs reviewed and stable Respiratory status: spontaneous breathing, nonlabored ventilation, respiratory function stable and patient connected to nasal cannula oxygen Cardiovascular status: blood pressure returned to baseline and stable Postop Assessment: no apparent nausea or vomiting Anesthetic complications: no   No notable events documented.   Last Vitals:  Vitals:   06/03/21 1345 06/03/21 1400  BP: 106/69 104/65  Pulse: 89 82  Resp: (!) 25 19  Temp:    SpO2: 91% 93%    Last Pain:  Vitals:   06/03/21 1400  TempSrc:   PainSc: 0-No pain                 Arita Miss

## 2021-06-04 ENCOUNTER — Encounter: Payer: Self-pay | Admitting: Vascular Surgery

## 2021-06-07 NOTE — Progress Notes (Signed)
  Subjective:  Patient ID: [name], female    DOB: [birth year],  MRN: [id number]  Follow-up ulcers and toe amputation   DOS: 01/01/21 Procedure: amputation L 5th toe  35 y.o. female returns for post-op check.  Had her second angiogram left side  Review of Systems: Negative except as noted in the HPI. Denies N/V/F/Ch.   Objective:   There were no vitals filed for this visit. There is no height or weight on file to calculate BMI. Constitutional Well developed. Well nourished.  Vascular  left foot is warm well perfused.  The right foot is cool to touch  Neurologic Normal speech. Oriented to person, place, and time. Epicritic sensation to light touch grossly decreased bilaterally.  Dermatologic Left foot gangrene of partial toes on toes 2 and 4, toe 3 has early gangrenous changes, open ulcer from 5th toe amp has not healed.  Small area of blistering under an ingrown nail on the right foot  Right foot 5th toe is gangrenous, the distal phalanx came off in the dressings and has an open wound exposed with joint   Orthopedic: Tenderness to palpation noted about the surgical site.   I reviewed her R foot angiography she has flow inline through PTA now    Radiographs: s/p amputation left 5th toe at MTPJ  Wound culture with moderate growth of corynebacterium Assessment:   1. Gangrene of toe of right foot ([place])   2. Ulcer of left foot with fat layer exposed ([place])      Plan:  Patient was evaluated and treated and all questions answered.  We again reviewed her progress.  She is likely proceeding towards amputation.  It is clear that the second fourth on the left and fifth toe on the right will require amputation.  The third toe is not quite demarcated.  I will reevaluate her in 1 week we will decide on this.We will plan for surgery next Friday.  Wound cultures of the drainage was taken  Lanae Crumbly, DPM 06/07/2021      Return in about 1 week (around 06/09/2021) for  surgery planning / consent .

## 2021-06-09 ENCOUNTER — Encounter: Payer: Self-pay | Admitting: Podiatry

## 2021-06-09 ENCOUNTER — Ambulatory Visit (INDEPENDENT_AMBULATORY_CARE_PROVIDER_SITE_OTHER): Payer: Medicare Other | Admitting: Podiatry

## 2021-06-09 ENCOUNTER — Other Ambulatory Visit: Payer: Self-pay

## 2021-06-09 DIAGNOSIS — I96 Gangrene, not elsewhere classified: Secondary | ICD-10-CM | POA: Diagnosis not present

## 2021-06-09 DIAGNOSIS — Z992 Dependence on renal dialysis: Secondary | ICD-10-CM

## 2021-06-09 DIAGNOSIS — N186 End stage renal disease: Secondary | ICD-10-CM

## 2021-06-09 DIAGNOSIS — L97522 Non-pressure chronic ulcer of other part of left foot with fat layer exposed: Secondary | ICD-10-CM | POA: Diagnosis not present

## 2021-06-09 DIAGNOSIS — I739 Peripheral vascular disease, unspecified: Secondary | ICD-10-CM

## 2021-06-10 LAB — WOUND CULTURE: Organism ID, Bacteria: NONE SEEN

## 2021-06-11 ENCOUNTER — Other Ambulatory Visit: Payer: Self-pay | Admitting: Podiatry

## 2021-06-11 DIAGNOSIS — I96 Gangrene, not elsewhere classified: Secondary | ICD-10-CM | POA: Diagnosis not present

## 2021-06-11 LAB — WOUND CULTURE

## 2021-06-11 MED ORDER — OXYCODONE-ACETAMINOPHEN 5-325 MG PO TABS: 1.0000 | ORAL_TABLET | Freq: Four times a day (QID) | ORAL | 0 refills | Status: DC | PRN

## 2021-06-14 NOTE — Progress Notes (Signed)
  Subjective:  Patient ID: [name], female    DOB: 09-06-1986,  MRN: [id number]  Follow-up ulcers and toe amputation   DOS: 01/01/21 Procedure: amputation L 5th toe  35 y.o. female returns for post-op check.  Had her second angiogram left side  Review of Systems: Negative except as noted in the HPI. Denies N/V/F/Ch.   Objective:   There were no vitals filed for this visit. There is no height or weight on file to calculate BMI. Constitutional Well developed. Well nourished.  Vascular  left foot is warm well perfused.  The right foot is cool to touch  Neurologic Normal speech. Oriented to person, place, and time. Epicritic sensation to light touch grossly decreased bilaterally.  Dermatologic Left foot gangrene of partial toes on toes 2 and 4, toe 3 has early gangrenous changes, open ulcer from 5th toe amp has not healed.  Small area of blistering under an ingrown nail on the right foot  Right foot 5th toe is gangrenous, the distal phalanx came off in the dressings and has an open wound exposed with joint   Orthopedic: Tenderness to palpation noted about the surgical site.   I reviewed her R foot angiography she has flow inline through PTA now    Radiographs: s/p amputation left 5th toe at MTPJ  Wound culture with moderate growth of corynebacterium Assessment:   1. Gangrene of toe of right foot ([place])   2. Ulcer of left foot with fat layer exposed ([place])   3. Peripheral arterial disease ([place])   4. ESRD (end stage renal disease) on dialysis ([place])   5. Gangrene of toe of left foot ([place])      Plan:  Patient was evaluated and treated and all questions answered.  Discussed the risk benefits and potential complications that could arise from proceeding with amputation including further infection or limb loss.  We finalize her surgical plan for amputation of the right fifth toe, excision of the wound from the left fifth toe amputation and amputation of the second third and  fourth toes at the MTPJ's.  I do not think the third toe is likely to survive this point.  All questions were addressed.  Informed consent was signed and reviewed.  Surgery scheduled for this Friday.   No follow-ups on file.

## 2021-06-16 ENCOUNTER — Other Ambulatory Visit: Payer: Self-pay

## 2021-06-16 ENCOUNTER — Ambulatory Visit (INDEPENDENT_AMBULATORY_CARE_PROVIDER_SITE_OTHER): Payer: Medicare Other | Admitting: Podiatry

## 2021-06-16 DIAGNOSIS — I96 Gangrene, not elsewhere classified: Secondary | ICD-10-CM | POA: Diagnosis not present

## 2021-06-16 MED ORDER — FLUCONAZOLE 200 MG PO TABS: 200.0000 mg | ORAL_TABLET | ORAL | 0 refills | Status: DC

## 2021-06-16 NOTE — Progress Notes (Signed)
  Subjective:  Patient ID: [name], female    DOB: [birth year],  MRN: [id number]  Chief Complaint  Patient presents with   Routine Post Op     POV #1 DOS 06/11/2021 AMPUTATION TOE MPJ JOINT 5TH RT, 2,4,5 LT      35 y.o. female returns for post-op check.  Feeling okay having some pain more than the last ones  Review of Systems: Negative except as noted in the HPI. Denies N/V/F/Ch.   Objective:  There were no vitals filed for this visit. There is no height or weight on file to calculate BMI. Constitutional Well developed. Well nourished.  Vascular Unchanged vascular status  Neurologic Normal speech. Oriented to person, place, and time. Epicritic sensation to light touch grossly present bilaterally.  Dermatologic Some maceration on both incisions but overall no dehiscence or signs of infection  Orthopedic: Tenderness to palpation noted about the surgical site.    Assessment:   1. Gangrene of toe of right foot ([place])   2. Gangrene of toe of left foot ([place])    Plan:  Patient was evaluated and treated and all questions answered.  S/p foot surgery bilaterally -Daily changes with iodine ointment -WBAT in surgical shoe was advised to rest as much as possible -We will reevaluate in 1 week, essentially on a weekly basis to not expect her sutures to be removed for 2-3 more weeks  Return in about 1 week (around 06/23/2021) for post op (no x-rays).

## 2021-06-17 ENCOUNTER — Other Ambulatory Visit (INDEPENDENT_AMBULATORY_CARE_PROVIDER_SITE_OTHER): Payer: Self-pay | Admitting: Vascular Surgery

## 2021-06-17 ENCOUNTER — Encounter: Payer: Self-pay | Admitting: Podiatry

## 2021-06-17 DIAGNOSIS — A419 Sepsis, unspecified organism: Secondary | ICD-10-CM

## 2021-06-17 DIAGNOSIS — I70262 Atherosclerosis of native arteries of extremities with gangrene, left leg: Secondary | ICD-10-CM

## 2021-06-17 DIAGNOSIS — Z95820 Peripheral vascular angioplasty status with implants and grafts: Secondary | ICD-10-CM

## 2021-06-17 HISTORY — DX: Sepsis, unspecified organism: A41.9

## 2021-06-18 ENCOUNTER — Encounter (INDEPENDENT_AMBULATORY_CARE_PROVIDER_SITE_OTHER): Payer: Medicare Other

## 2021-06-18 ENCOUNTER — Encounter (INDEPENDENT_AMBULATORY_CARE_PROVIDER_SITE_OTHER): Payer: Self-pay | Admitting: Nurse Practitioner

## 2021-06-18 ENCOUNTER — Ambulatory Visit (INDEPENDENT_AMBULATORY_CARE_PROVIDER_SITE_OTHER): Payer: Medicare Other | Admitting: Nurse Practitioner

## 2021-06-23 ENCOUNTER — Ambulatory Visit (INDEPENDENT_AMBULATORY_CARE_PROVIDER_SITE_OTHER): Payer: Medicare Other | Admitting: Podiatry

## 2021-06-23 ENCOUNTER — Encounter: Payer: Medicare Other | Admitting: Podiatry

## 2021-06-23 ENCOUNTER — Other Ambulatory Visit: Payer: Self-pay

## 2021-06-23 DIAGNOSIS — I96 Gangrene, not elsewhere classified: Secondary | ICD-10-CM | POA: Diagnosis not present

## 2021-06-26 NOTE — Progress Notes (Signed)
  Subjective:  Patient ID: [name], female    DOB: 29-Jan-1986,  MRN: [id number]  Chief Complaint  Patient presents with   Routine Post Op      POV #2 DOS 06/11/2021 AMPUTATION TOE MPJ JOINT 5TH RT, 2,4,5 LT      35 y.o. female returns for post-op check.  Feeling okay having some pain more than the last ones  Review of Systems: Negative except as noted in the HPI. Denies N/V/F/Ch.   Objective:  There were no vitals filed for this visit. There is no height or weight on file to calculate BMI. Constitutional Well developed. Well nourished.  Vascular Unchanged vascular status  Neurologic Normal speech. Oriented to person, place, and time. Epicritic sensation to light touch grossly present bilaterally.  Dermatologic Maceration is improved but incision remains intact with sutures intact  Orthopedic: Tenderness to palpation noted about the surgical site.    Assessment:   1. Gangrene of toe of right foot ([place])   2. Gangrene of toe of left foot ([place])     Plan:  Patient was evaluated and treated and all questions answered.  S/p foot surgery bilaterally -Daily changes with iodine ointment -WBAT in surgical shoe was advised to rest as much as possible -Return in 2 weeks for suture removal  Return in about 2 weeks (around 07/07/2021) for suture removal, post op (no x-rays).

## 2021-07-02 ENCOUNTER — Encounter: Payer: BLUE CROSS/BLUE SHIELD | Admitting: Podiatry

## 2021-07-07 ENCOUNTER — Inpatient Hospital Stay
Admission: EM | Admit: 2021-07-07 | Discharge: 2021-07-09 | DRG: 640 | Disposition: A | Discharge status: Home / Self Care | Payer: Medicare Other | Attending: Family Medicine | Admitting: Family Medicine

## 2021-07-07 ENCOUNTER — Encounter: Payer: Self-pay | Admitting: Emergency Medicine

## 2021-07-07 ENCOUNTER — Other Ambulatory Visit: Payer: Self-pay

## 2021-07-07 ENCOUNTER — Encounter: Payer: Self-pay | Admitting: Hematology and Oncology

## 2021-07-07 ENCOUNTER — Emergency Department: Payer: Medicare Other

## 2021-07-07 ENCOUNTER — Ambulatory Visit (INDEPENDENT_AMBULATORY_CARE_PROVIDER_SITE_OTHER): Payer: Medicare Other | Admitting: Podiatry

## 2021-07-07 ENCOUNTER — Encounter: Payer: Medicare Other | Admitting: Podiatry

## 2021-07-07 DIAGNOSIS — E860 Dehydration: Secondary | ICD-10-CM | POA: Diagnosis not present

## 2021-07-07 DIAGNOSIS — R1011 Right upper quadrant pain: Secondary | ICD-10-CM | POA: Diagnosis not present

## 2021-07-07 DIAGNOSIS — Z20822 Contact with and (suspected) exposure to covid-19: Secondary | ICD-10-CM | POA: Diagnosis present

## 2021-07-07 DIAGNOSIS — I739 Peripheral vascular disease, unspecified: Secondary | ICD-10-CM | POA: Diagnosis not present

## 2021-07-07 DIAGNOSIS — Z841 Family history of disorders of kidney and ureter: Secondary | ICD-10-CM

## 2021-07-07 DIAGNOSIS — Z713 Dietary counseling and surveillance: Secondary | ICD-10-CM

## 2021-07-07 DIAGNOSIS — N189 Chronic kidney disease, unspecified: Secondary | ICD-10-CM | POA: Diagnosis present

## 2021-07-07 DIAGNOSIS — A419 Sepsis, unspecified organism: Secondary | ICD-10-CM | POA: Diagnosis present

## 2021-07-07 DIAGNOSIS — I96 Gangrene, not elsewhere classified: Secondary | ICD-10-CM

## 2021-07-07 DIAGNOSIS — E871 Hypo-osmolality and hyponatremia: Secondary | ICD-10-CM | POA: Diagnosis present

## 2021-07-07 DIAGNOSIS — Z89422 Acquired absence of other left toe(s): Secondary | ICD-10-CM

## 2021-07-07 DIAGNOSIS — N2581 Secondary hyperparathyroidism of renal origin: Secondary | ICD-10-CM | POA: Diagnosis present

## 2021-07-07 DIAGNOSIS — E1151 Type 2 diabetes mellitus with diabetic peripheral angiopathy without gangrene: Secondary | ICD-10-CM | POA: Diagnosis present

## 2021-07-07 DIAGNOSIS — R0902 Hypoxemia: Secondary | ICD-10-CM | POA: Diagnosis present

## 2021-07-07 DIAGNOSIS — R6521 Severe sepsis with septic shock: Secondary | ICD-10-CM | POA: Diagnosis present

## 2021-07-07 DIAGNOSIS — Z89421 Acquired absence of other right toe(s): Secondary | ICD-10-CM

## 2021-07-07 DIAGNOSIS — Z992 Dependence on renal dialysis: Secondary | ICD-10-CM

## 2021-07-07 DIAGNOSIS — I10 Essential (primary) hypertension: Secondary | ICD-10-CM | POA: Diagnosis present

## 2021-07-07 DIAGNOSIS — I5032 Chronic diastolic (congestive) heart failure: Secondary | ICD-10-CM | POA: Diagnosis present

## 2021-07-07 DIAGNOSIS — I4510 Unspecified right bundle-branch block: Secondary | ICD-10-CM | POA: Diagnosis present

## 2021-07-07 DIAGNOSIS — Z8249 Family history of ischemic heart disease and other diseases of the circulatory system: Secondary | ICD-10-CM

## 2021-07-07 DIAGNOSIS — J45909 Unspecified asthma, uncomplicated: Secondary | ICD-10-CM | POA: Diagnosis present

## 2021-07-07 DIAGNOSIS — E282 Polycystic ovarian syndrome: Secondary | ICD-10-CM | POA: Diagnosis present

## 2021-07-07 DIAGNOSIS — E1122 Type 2 diabetes mellitus with diabetic chronic kidney disease: Secondary | ICD-10-CM | POA: Diagnosis present

## 2021-07-07 DIAGNOSIS — K819 Cholecystitis, unspecified: Secondary | ICD-10-CM | POA: Diagnosis present

## 2021-07-07 DIAGNOSIS — K5900 Constipation, unspecified: Secondary | ICD-10-CM | POA: Diagnosis present

## 2021-07-07 DIAGNOSIS — Z6841 Body Mass Index (BMI) 40.0 and over, adult: Secondary | ICD-10-CM

## 2021-07-07 DIAGNOSIS — Z7982 Long term (current) use of aspirin: Secondary | ICD-10-CM

## 2021-07-07 DIAGNOSIS — E872 Acidosis: Secondary | ICD-10-CM | POA: Diagnosis present

## 2021-07-07 DIAGNOSIS — I132 Hypertensive heart and chronic kidney disease with heart failure and with stage 5 chronic kidney disease, or end stage renal disease: Secondary | ICD-10-CM | POA: Diagnosis present

## 2021-07-07 DIAGNOSIS — N186 End stage renal disease: Secondary | ICD-10-CM | POA: Diagnosis present

## 2021-07-07 DIAGNOSIS — D631 Anemia in chronic kidney disease: Secondary | ICD-10-CM | POA: Diagnosis present

## 2021-07-07 DIAGNOSIS — I959 Hypotension, unspecified: Secondary | ICD-10-CM | POA: Diagnosis present

## 2021-07-07 DIAGNOSIS — Z7902 Long term (current) use of antithrombotics/antiplatelets: Secondary | ICD-10-CM

## 2021-07-07 LAB — BASIC METABOLIC PANEL
Anion gap: 20 — ABNORMAL HIGH (ref 5–15)
BUN: 55 mg/dL — ABNORMAL HIGH (ref 6–20)
CO2: 23 mmol/L (ref 22–32)
Calcium: 9.6 mg/dL (ref 8.9–10.3)
Chloride: 89 mmol/L — ABNORMAL LOW (ref 98–111)
Creatinine, Ser: 14.18 mg/dL — ABNORMAL HIGH (ref 0.44–1.00)
GFR, Estimated: 3 mL/min — ABNORMAL LOW (ref >60)
Glucose, Bld: 139 mg/dL — ABNORMAL HIGH (ref 70–99)
Potassium: 3.6 mmol/L (ref 3.5–5.1)
Sodium: 132 mmol/L — ABNORMAL LOW (ref 135–145)

## 2021-07-07 LAB — CBC WITH DIFFERENTIAL/PLATELET
Abs Immature Granulocytes: 0.17 10*3/uL — ABNORMAL HIGH (ref 0.00–0.07)
Basophils Absolute: 0.1 10*3/uL (ref 0.0–0.1)
Basophils Relative: 0 %
Eosinophils Absolute: 0.4 10*3/uL (ref 0.0–0.5)
Eosinophils Relative: 2 %
HCT: 38.5 % (ref 36.0–46.0)
Hemoglobin: 12.2 g/dL (ref 12.0–15.0)
Immature Granulocytes: 1 %
Lymphocytes Relative: 14 %
Lymphs Abs: 3 10*3/uL (ref 0.7–4.0)
MCH: 30.2 pg (ref 26.0–34.0)
MCHC: 31.7 g/dL (ref 30.0–36.0)
MCV: 95.3 fL (ref 80.0–100.0)
Monocytes Absolute: 1.1 10*3/uL — ABNORMAL HIGH (ref 0.1–1.0)
Monocytes Relative: 5 %
Neutro Abs: 16.8 10*3/uL — ABNORMAL HIGH (ref 1.7–7.7)
Neutrophils Relative %: 78 %
Platelets: 213 10*3/uL (ref 150–400)
RBC: 4.04 MIL/uL (ref 3.87–5.11)
RDW: 14.6 % (ref 11.5–15.5)
WBC: 21.6 10*3/uL — ABNORMAL HIGH (ref 4.0–10.5)
nRBC: 0.1 % (ref 0.0–0.2)

## 2021-07-07 LAB — LACTIC ACID, PLASMA
Lactic Acid, Venous: 3 mmol/L (ref 0.5–1.9)
Lactic Acid, Venous: 3.1 mmol/L (ref 0.5–1.9)

## 2021-07-07 LAB — HEPATIC FUNCTION PANEL
ALT: 9 U/L (ref 0–44)
AST: 14 U/L — ABNORMAL LOW (ref 15–41)
Albumin: 3.7 g/dL (ref 3.5–5.0)
Alkaline Phosphatase: 164 U/L — ABNORMAL HIGH (ref 38–126)
Bilirubin, Direct: 0.1 mg/dL (ref 0.0–0.2)
Indirect Bilirubin: 0.7 mg/dL (ref 0.3–0.9)
Total Bilirubin: 0.8 mg/dL (ref 0.3–1.2)
Total Protein: 9.4 g/dL — ABNORMAL HIGH (ref 6.5–8.1)

## 2021-07-07 LAB — TYPE AND SCREEN
ABO/RH(D): A POS
Antibody Screen: NEGATIVE

## 2021-07-07 LAB — HCG, QUANTITATIVE, PREGNANCY: hCG, Beta Chain, Quant, S: 5 m[IU]/mL — ABNORMAL HIGH (ref <5)

## 2021-07-07 LAB — BRAIN NATRIURETIC PEPTIDE: B Natriuretic Peptide: 65.3 pg/mL (ref 0.0–100.0)

## 2021-07-07 LAB — LIPASE, BLOOD: Lipase: 43 U/L (ref 11–51)

## 2021-07-07 IMAGING — CT CT ABD-PELV W/ CM
2 of 4 series · 15 of 46 positions shown, 17 images · IV contrast (APPLIED)
Comparison: None.

CLINICAL DATA: Abdominal pain and fever.

EXAM:
CT ABDOMEN AND PELVIS WITH CONTRAST
TECHNIQUE: Multidetector CT imaging of the abdomen and pelvis was performed
using the standard protocol following bolus administration of
intravenous contrast.
CONTRAST:  100mL OMNIPAQUE IOHEXOL 350 MG/ML SOLN

[Series 2: routine abd/pel with · axial · 0.98mm/px · z∈[-760,-270]mm · 12 of 108 slices shown, 14 images]
[im 5/108  soft-tissue]
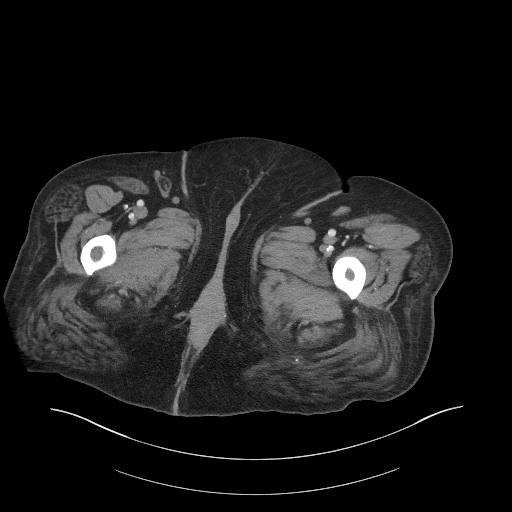
[im 5/108  bone]
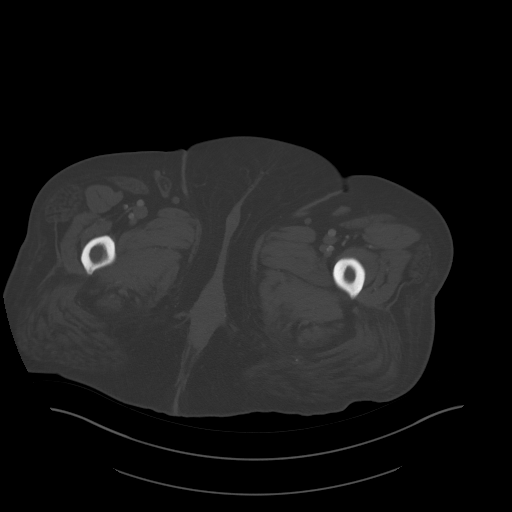
[im 14/108  soft-tissue]
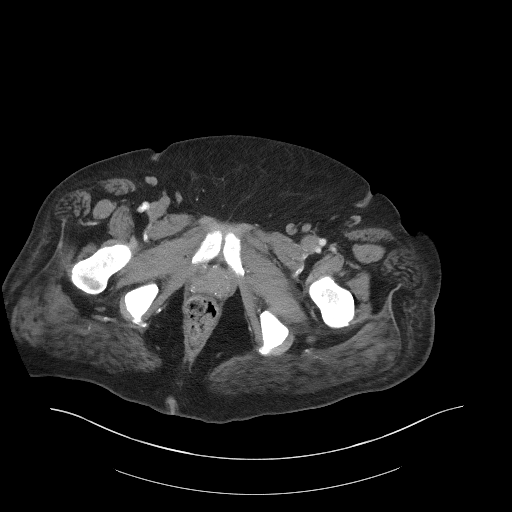
[im 23/108  soft-tissue]
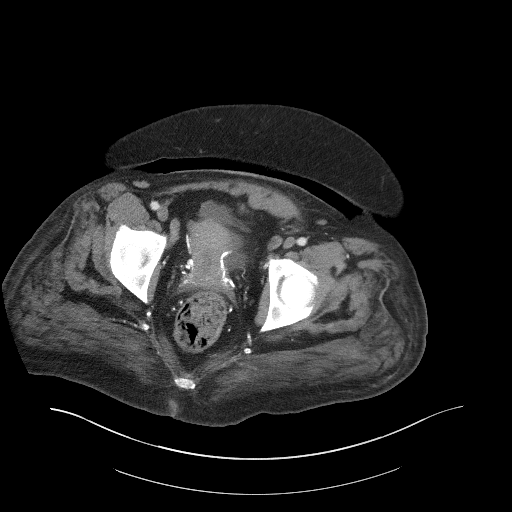
[im 32/108  soft-tissue]
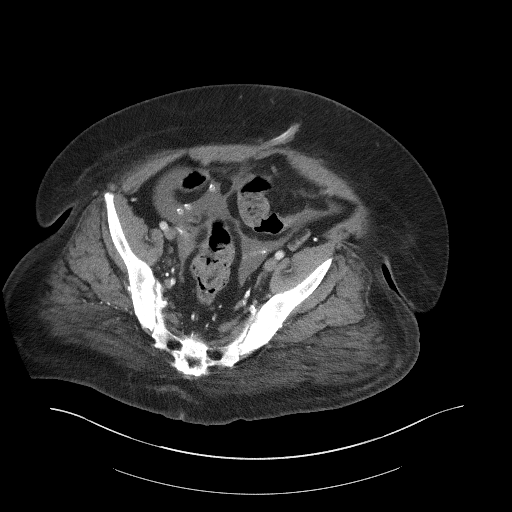
[im 41/108  soft-tissue]
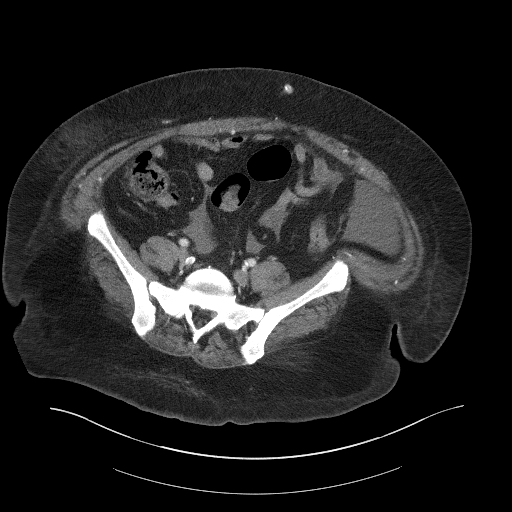
[im 50/108  soft-tissue]
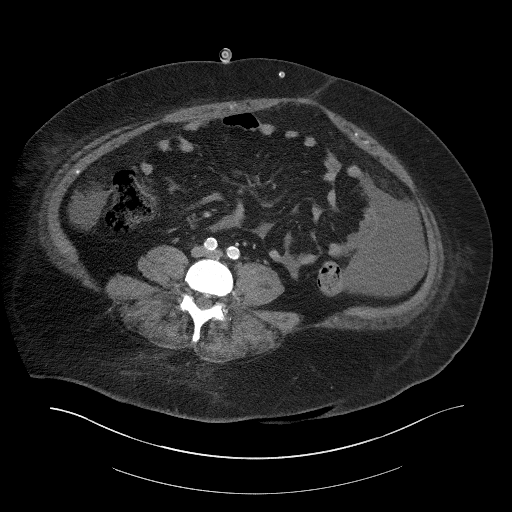
[im 58/108  soft-tissue]
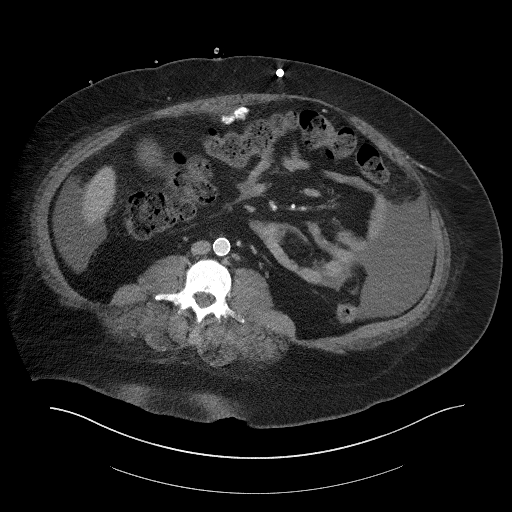
[im 67/108  soft-tissue]
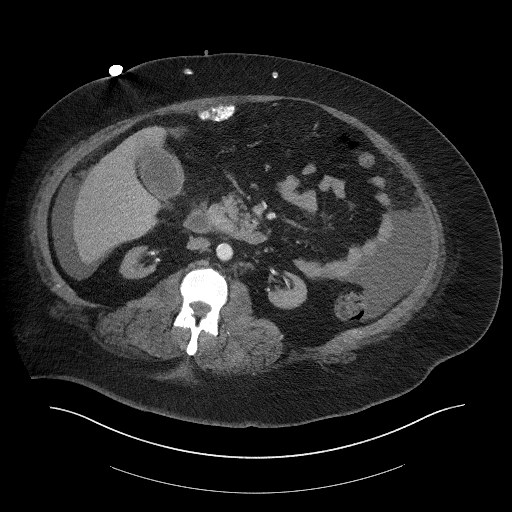
[im 76/108  soft-tissue]
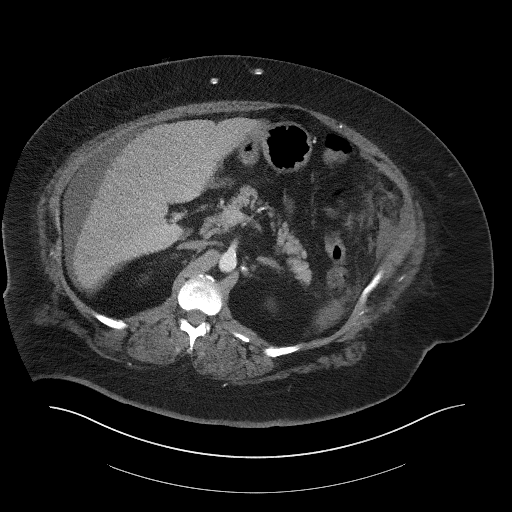
[im 76/108  bone]
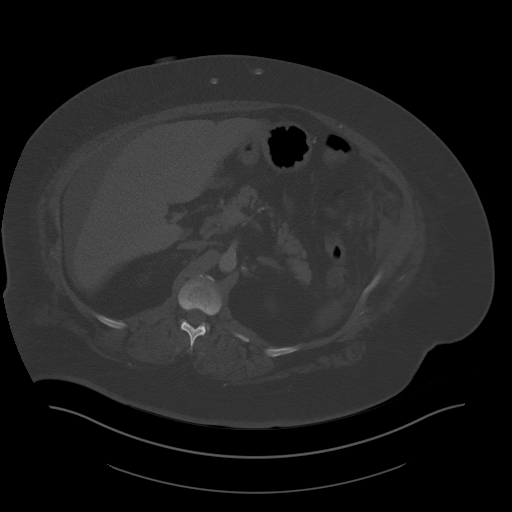
[im 85/108  soft-tissue]
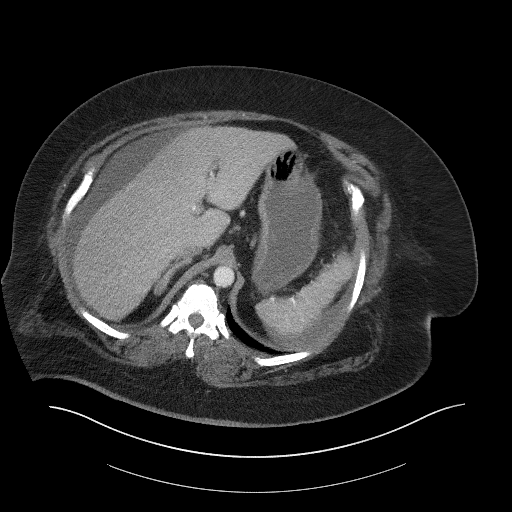
[im 94/108  soft-tissue]
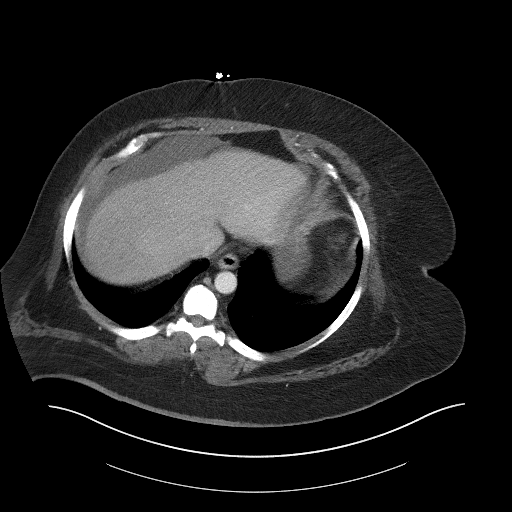
[im 103/108  soft-tissue]
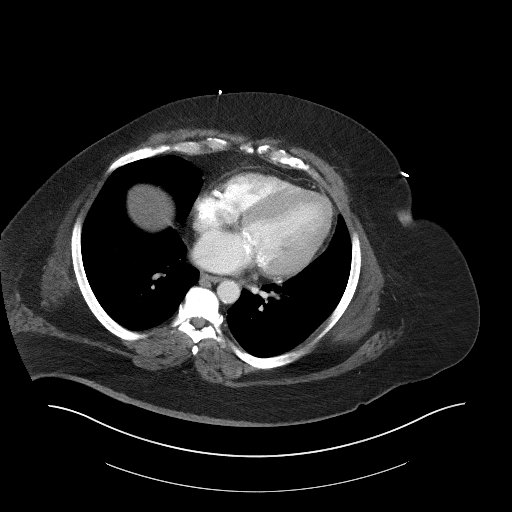

[Series 5: coronal st · coronal · 0.96mm/px · 3 of 111 slices shown]
[im 37/111  soft-tissue]
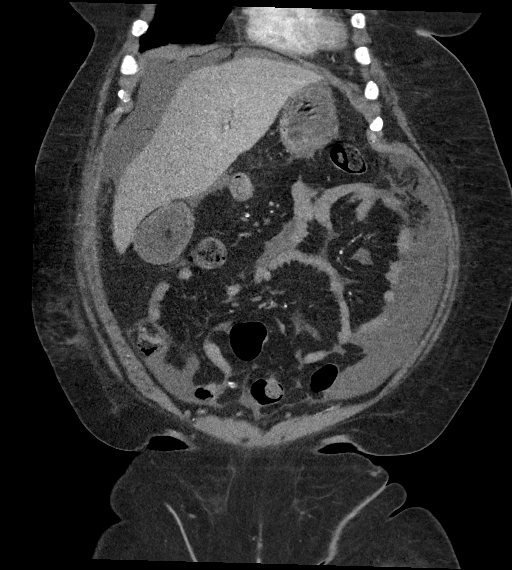
[im 49/111  soft-tissue]
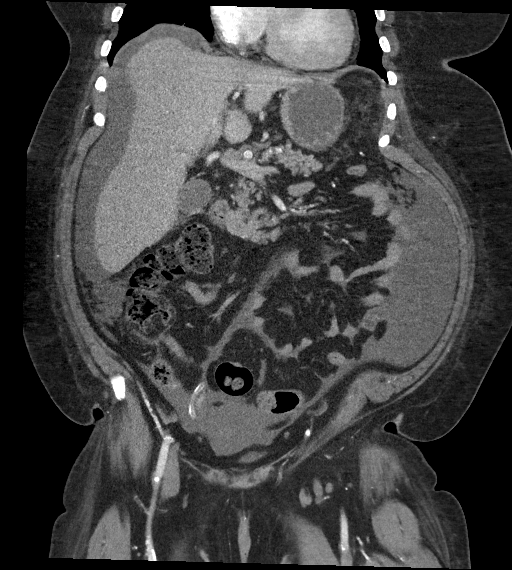
[im 62/111  soft-tissue]
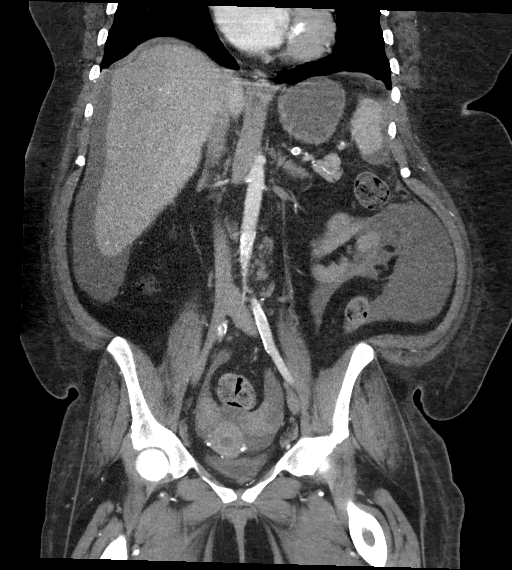

[15 of 46 positions shown; findings below may reference images not displayed]

FINDINGS: Lower chest: No pleural effusion or focal airspace disease. Mitral
annulus calcifications. Coronary artery calcifications, age
advanced.

Hepatobiliary: Enlarged liver spanning 22.5 cm cranial caudal with
steatosis. No focal hepatic lesion. Hepatic contours are slightly
nodular. Distended gallbladder with wall thickening up to 7 mm.
Cholesterol as well as calcified gallstones in the gallbladder. No
biliary dilatation or visualized choledocholithiasis.

Pancreas: No ductal dilatation or inflammation.

Spleen: Normal in size without focal abnormality.

Adrenals/Urinary Tract: 2.2 cm fat density right adrenal nodule
consistent with myelolipoma. No left adrenal nodule. Marked
bilateral renal atrophy consistent with chronic renal disease. No
hydronephrosis. Urinary bladder completely empty.

Stomach/Bowel: No bowel obstruction or inflammation. Moderate
colonic stool burden and tortuosity. Appendix tentatively visualized
and normal. Majority of small bowel is decompressed. Fluid or
ingested contents within the stomach.

Vascular/Lymphatic: Advanced aortic and branch atherosclerosis,
particularly for age. No aortic aneurysm. No acute vascular
findings. Multiple small retroperitoneal lymph nodes, likely
reactive. Prominent sized bilateral inguinal nodes have normal fatty
hila. No suspicious abdominopelvic adenopathy.

Reproductive: Other than uterine vascular calcifications,
unremarkable CT appearance of the uterus. No adnexal mass.

Other: Peritoneal dialysis catheter in place, tip in the right
pelvis. Moderate volume of free fluid throughout the abdomen and
pelvis. No loculated fluid collection. Amorphous soft tissue
calcifications in the anterior abdomen just beneath the abdominal
wall musculature may be dystrophic or sequela of fat necrosis. No
free intra-abdominal air. Scattered body wall edema.

Musculoskeletal: Sequela of renal osteodystrophy. No acute osseous
abnormalities.
IMPRESSION: 1. Distended gallbladder with wall thickening up to 7 mm. This could
represent acute cholecystitis in the appropriate clinical setting.
Cholesterol as well as calcified gallstones in the gallbladder. No
biliary dilatation or visualized choledocholithiasis.
2. Hepatomegaly and hepatic steatosis. Hepatic contours are slightly
nodular, raising concern for cirrhosis.
3. Peritoneal dialysis catheter in place, tip in the right pelvis.
Moderate volume of free fluid throughout the abdomen and pelvis.
4. Marked bilateral renal atrophy consistent with chronic renal
disease.

Aortic Atherosclerosis ([LW]-[LW]).

## 2021-07-07 IMAGING — DX DG CHEST 1V
1 series · 1 of 1 positions shown · non-contrast
Comparison: Chest radiograph dated [DATE].

CLINICAL DATA: Hypotension.

EXAM:
CHEST  1 VIEW

[chest ap]
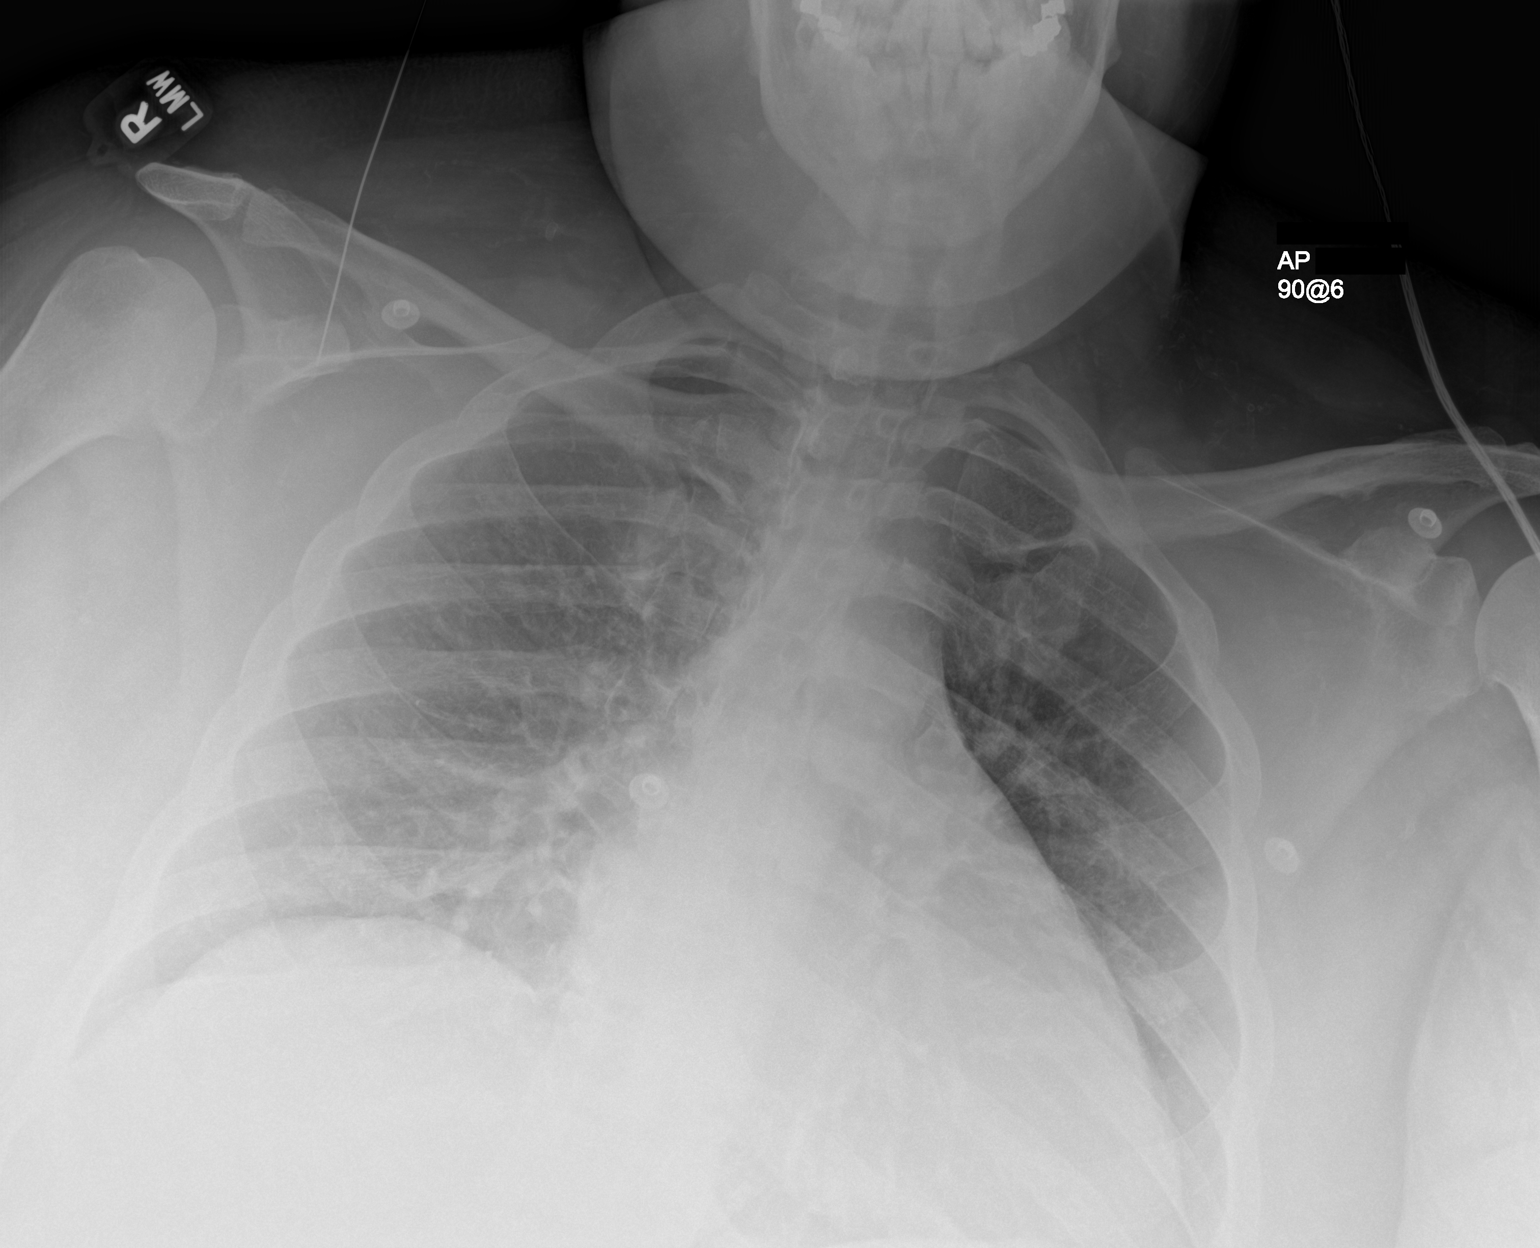

[1 of 1 positions shown; findings below may reference images not displayed]

FINDINGS: Cardiomegaly with vascular congestion. No focal consolidation,
pleural effusion, or pneumothorax. Evaluation however is limited as
the left lung base and left costophrenic angle has been excluded
from the image. No acute osseous pathology.
IMPRESSION: Cardiomegaly with mild vascular congestion.  No focal consolidation.

## 2021-07-07 IMAGING — US US ABDOMEN LIMITED
1 series · 14 of 25 positions shown · non-contrast
Comparison: CT abdomen pelvis dated [DATE].

CLINICAL DATA: Right upper quadrant abdominal pain.

EXAM:
ULTRASOUND ABDOMEN LIMITED RIGHT UPPER QUADRANT

[Series 1: us abdomen limited ruq (liver/gb) · 14 of 50 slices shown]
[im 1/50]
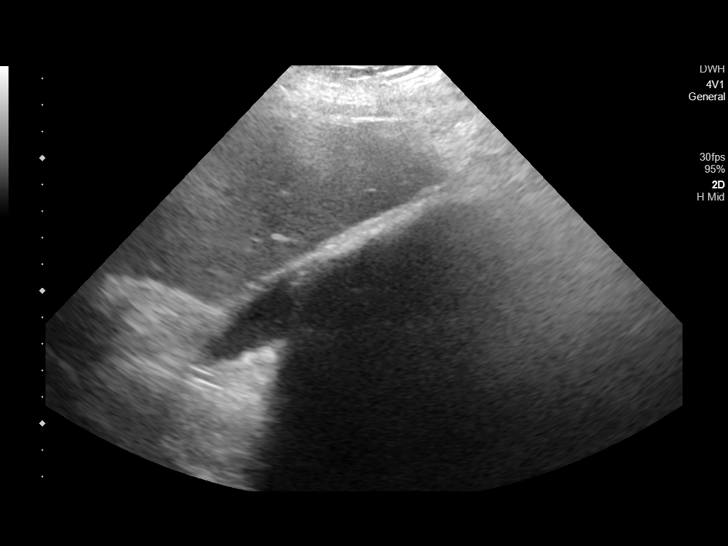
[im 5/50]
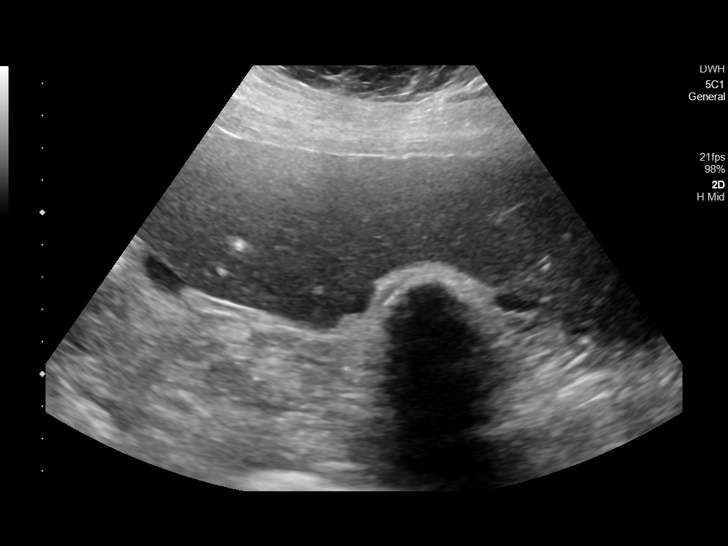
[im 9/50]
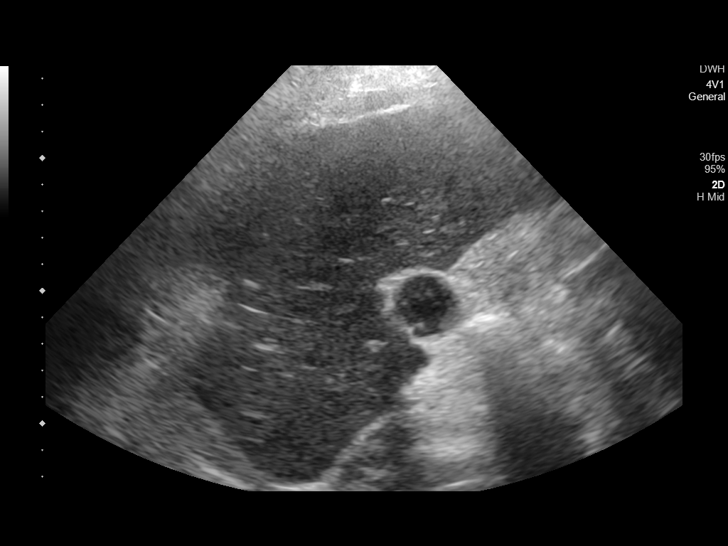
[im 13/50]
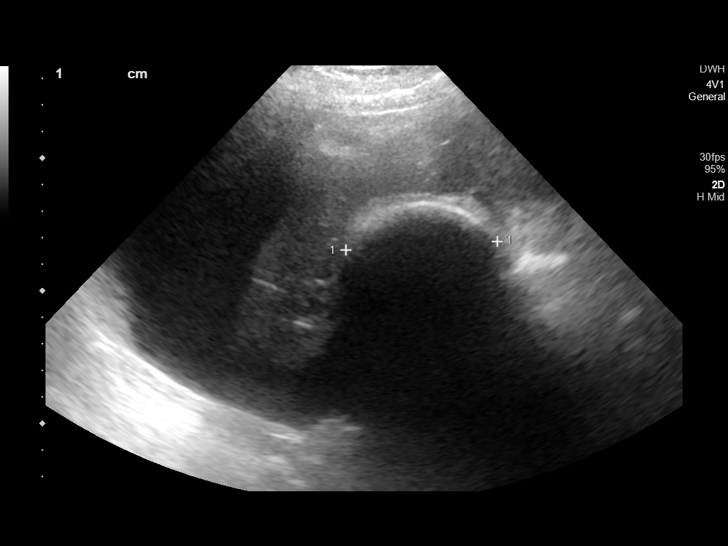
[im 17/50]
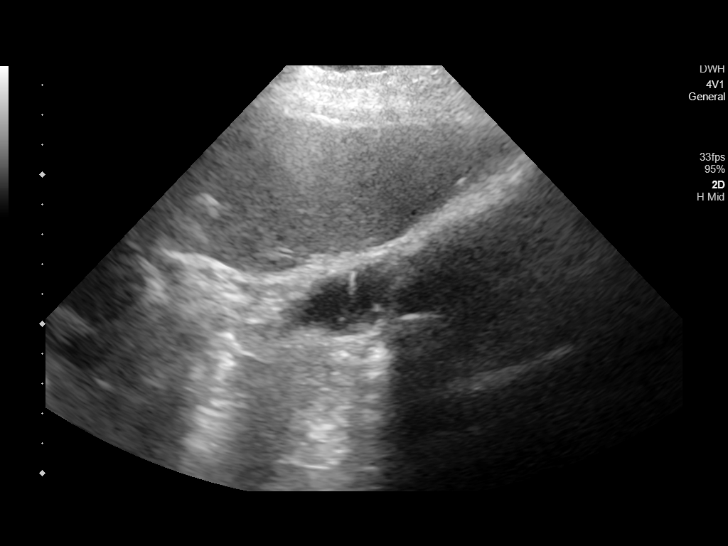
[im 19/50]
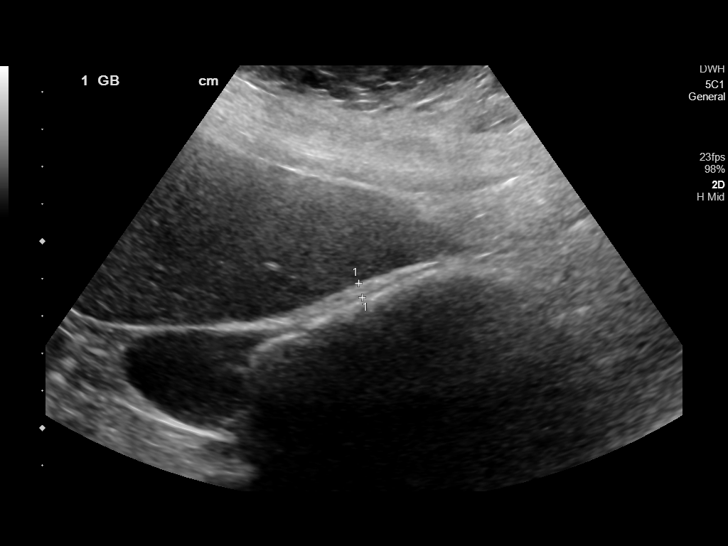
[im 23/50]
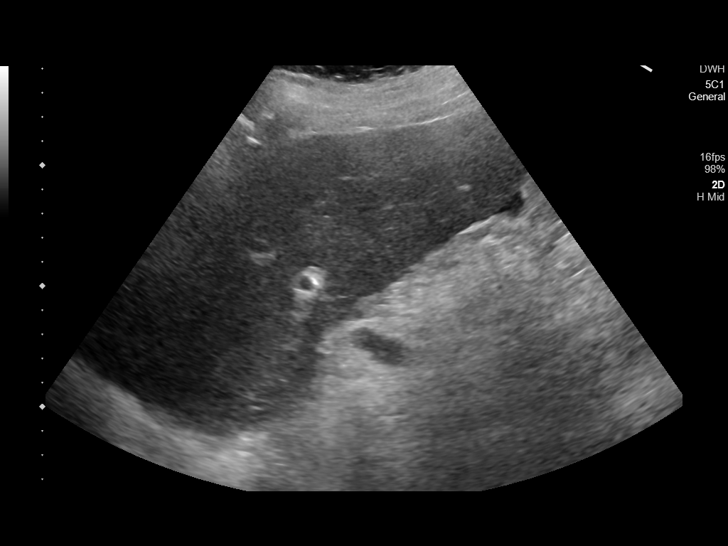
[im 27/50]
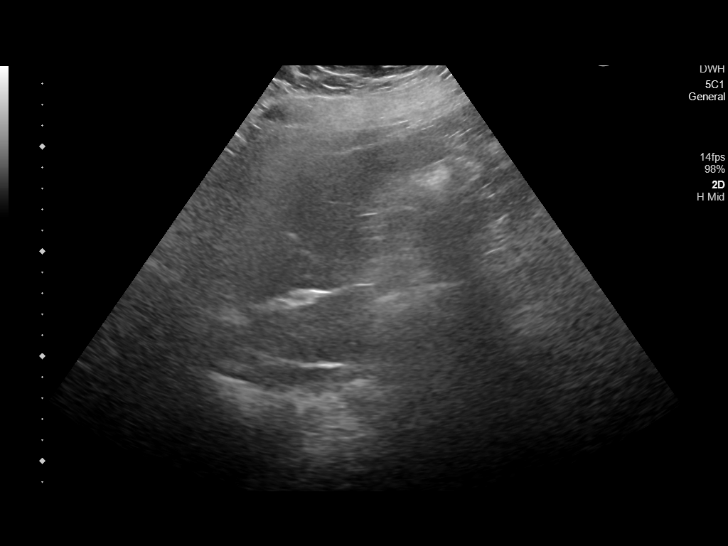
[im 31/50]
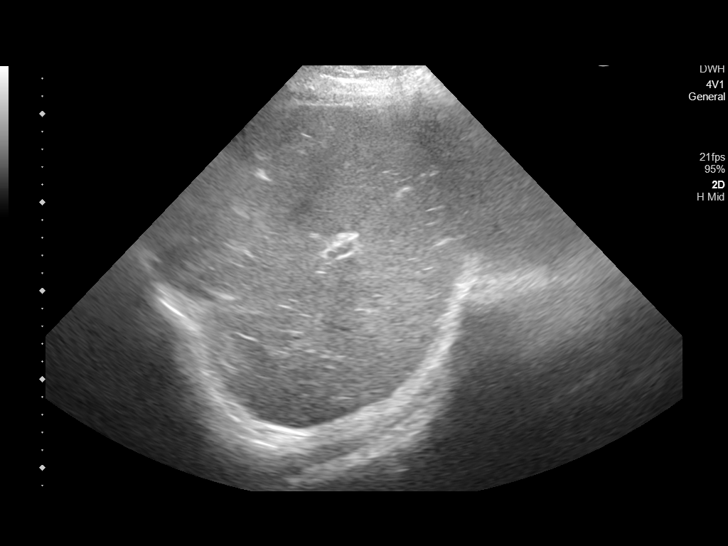
[im 33/50]
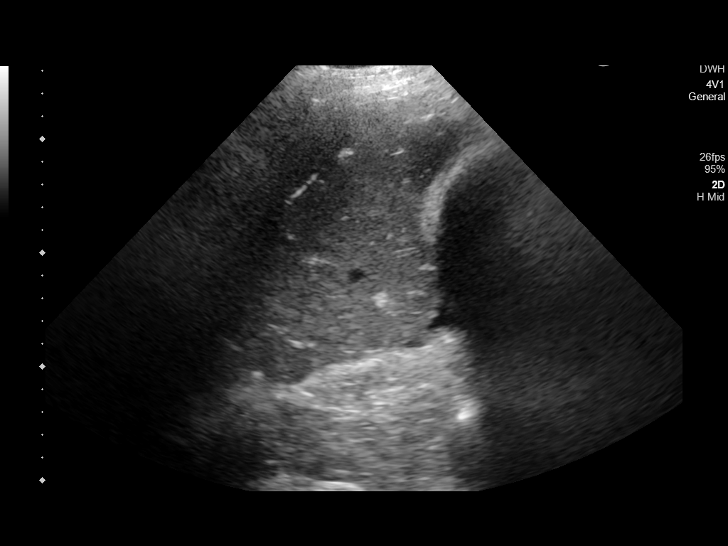
[im 37/50]
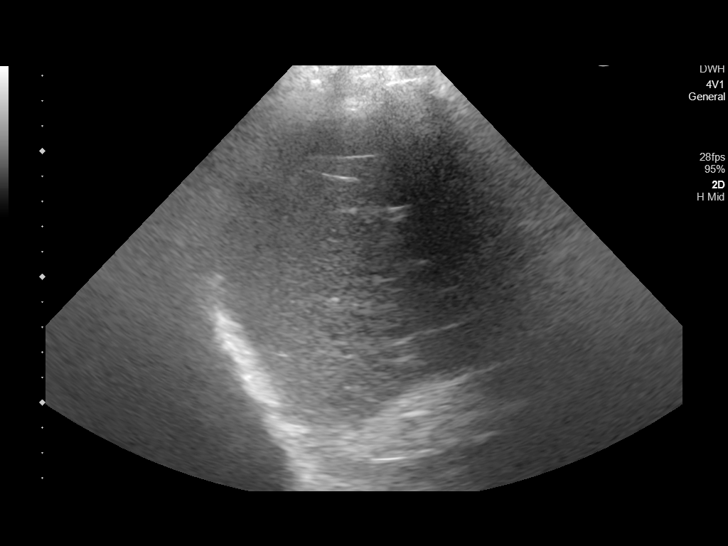
[im 41/50]
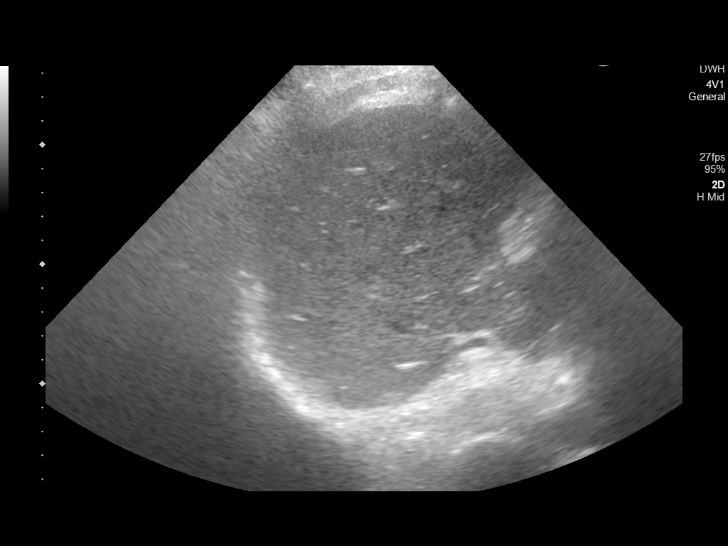
[im 45/50]
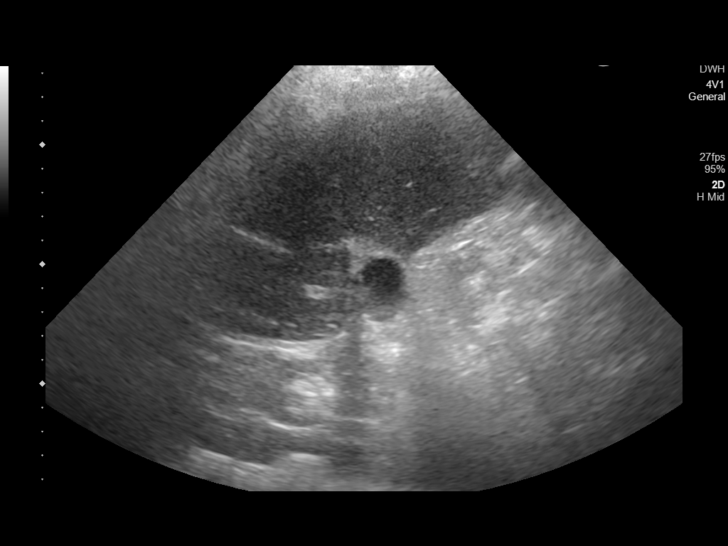
[im 50/50]
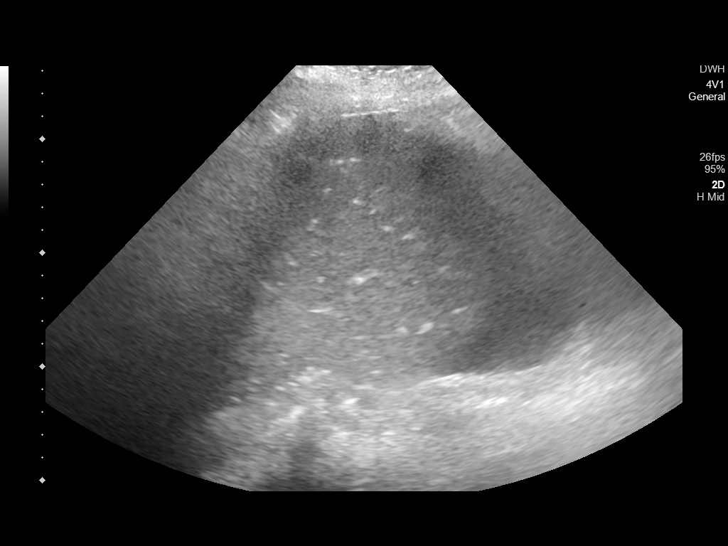

[14 of 25 positions shown; findings below may reference images not displayed]

FINDINGS: Gallbladder:

The gallbladder is filled with stones. The gallbladder wall is
minimally thickened measuring 4 mm. Negative sonographic Murphy's
sign.

Common bile duct:

Diameter: 3 mm

Liver:

There is mild coarsened echotexture and surface irregularity
concerning for changes of cirrhosis. Portal vein is patent on color
Doppler imaging with normal direction of blood flow towards the
liver.

Other: Small ascites.
IMPRESSION: 1. Stone filled gallbladder without definite sonographic evidence of
acute cholecystitis. A hepatobiliary scintigraphy may provide better
evaluation of the gallbladder if there is a high clinical concern
for acute cholecystitis .
2. Possible early changes of cirrhosis.
3. Small ascites.

## 2021-07-07 MED ORDER — RA POVIDINE IODINE 10 % EX OINT: 1.0000 "application " | TOPICAL_OINTMENT | CUTANEOUS | 0 refills | Status: DC | PRN

## 2021-07-07 MED ORDER — ONDANSETRON HCL 4 MG/2ML IJ SOLN
INTRAMUSCULAR | Status: AC
  Administered 2021-07-07: 4 mg via INTRAVENOUS
  Filled 2021-07-07: qty 2

## 2021-07-07 MED ORDER — SODIUM CHLORIDE 0.9 % IV BOLUS
500.0000 mL | Freq: Once | INTRAVENOUS | Status: AC
Start: 2021-07-07 — End: 2021-07-07
  Administered 2021-07-07: 500 mL via INTRAVENOUS

## 2021-07-07 MED ORDER — PIPERACILLIN-TAZOBACTAM 4.5 G IVPB
4.5000 g | Freq: Once | INTRAVENOUS | Status: AC
  Administered 2021-07-07: 4.5 g via INTRAVENOUS
  Filled 2021-07-07: qty 100

## 2021-07-07 MED ORDER — IOHEXOL 350 MG/ML SOLN
100.0000 mL | Freq: Once | INTRAVENOUS | Status: AC | PRN
  Administered 2021-07-07: 100 mL via INTRAVENOUS

## 2021-07-07 MED ORDER — ONDANSETRON HCL 4 MG/2ML IJ SOLN: 4.0000 mg | Freq: Once | INTRAMUSCULAR | Status: AC

## 2021-07-07 NOTE — ED Triage Notes (Signed)
Pt comes into the ED via EMS from home with c/o hypotension, dizzy, nausea for the past 3 days, constipation, taken miralax with no relief, daily peritoneal dialysis at home, completed treatment today,  Recent amputation of toes

## 2021-07-07 NOTE — ED Provider Notes (Signed)
[facility]  ____________________________________________   Event Date/Time   First MD Initiated Contact with Patient 07/07/21 1947     (approximate)  I have reviewed the triage vital signs and the nursing notes.   HISTORY  Chief Complaint Hypotension    HPI [name] is a 35 y.o. female medical history of end-stage renal disease on nightly peritoneal dialysis, hypertension, diabetes, asthma who presents with nausea and vomiting.  Symptoms have been going on for several days.  She endorses multiple episodes of nonbilious nonbloody emesis and inability to tolerate p.o.  She is continued to do her peritoneal dialysis no issues there.  She does endorse some generalized abdominal pain which is nonfocal.  No fevers or chills.  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  She denies chest pain dyspnea.  Patient makes very little urine.         Past Medical History:  Diagnosis Date   Anemia of chronic disease    Asthma    Calciphylaxis 2020   CHF (congestive heart failure) ([place])    CKD (chronic kidney disease), stage III ([place])    Diabetes ([place])    type 2   Hypertension    Morbid obesity ([place])     Patient Active Problem List   Diagnosis Date Noted   Extremity cyanosis 09/29/2020   Obesity, Class III, BMI 40-49.9 (morbid obesity) ([place]) 09/15/2019   Infection of anterior lower leg 09/14/2019   Calciphylaxis    ESRD (end stage renal disease) (HCC)    Chronic diastolic CHF (congestive heart failure) ([place])    Asthma 07/18/2019   Sepsis due to cellulitis ([place]) 07/02/2019   ESRD (end stage renal disease) on dialysis ([place]) 05/02/2019   Morbid obesity with BMI of 50.0-59.9, adult ([place]) 05/02/2019   Essential hypertension 05/02/2019   Chronic kidney disease, stage III (moderate) ([place]) 08/31/2017   Impaired fasting glucose 08/31/2017   Anemia 05/27/2016   HTN (hypertension) 03/11/2016   Renal insufficiency 03/11/2016   Menorrhagia    Nephrotic  syndrome    Anemia in chronic kidney disease    Iron deficiency anemia 02/12/2016   CHF (congestive heart failure) ([place]) 02/11/2016    Past Surgical History:  Procedure Laterality Date   A/V FISTULAGRAM Left 10/29/2020   Procedure: A/V FISTULAGRAM;  Surgeon: Algernon Huxley, MD;  Location: [place] [facility];  Service: Cardiovascular;  Laterality: Left;   APPLICATION OF WOUND VAC Bilateral 09/15/2019   Procedure: APPLICATION OF WOUND VAC;  Surgeon: Benjamine Sprague, DO;  Location: [facility];  Service: General;  Laterality: Bilateral;   AV FISTULA PLACEMENT Left 07/09/2020   Procedure: ARTERIOVENOUS (AV) FISTULA CREATION ( BRACHIAL CEPHALIC);  Surgeon: Algernon Huxley, MD;  Location: [facility];  Service: Vascular;  Laterality: Left;   DIALYSIS/PERMA CATHETER INSERTION N/A 02/28/2019   Procedure: DIALYSIS/PERMA CATHETER INSERTION;  Surgeon: Algernon Huxley, MD;  Location: [place] [facility];  Service: Cardiovascular;  Laterality: N/A;   DIALYSIS/PERMA CATHETER INSERTION N/A 09/17/2019   Procedure: DIALYSIS/PERMA CATHETER EXCHANGE;  Surgeon: Katha Cabal, MD;  Location: [place] [facility];  Service: Cardiovascular;  Laterality: N/A;   DIALYSIS/PERMA CATHETER REMOVAL N/A 03/18/2020   Procedure: DIALYSIS/PERMA CATHETER REMOVAL;  Surgeon: Algernon Huxley, MD;  Location: [place] [facility];  Service: Cardiovascular;  Laterality: N/A;   LIGATION OF ARTERIOVENOUS  FISTULA Left 06/03/2021   Procedure: LIGATION OF ARTERIOVENOUS  FISTULA;  Surgeon: Algernon Huxley, MD;  Location: [facility];  Service: Vascular;  Laterality: Left;  LOWER EXTREMITY ANGIOGRAPHY Left 12/02/2020   Procedure: LOWER EXTREMITY ANGIOGRAPHY;  Surgeon: Algernon Huxley, MD;  Location: [place] [facility];  Service: Cardiovascular;  Laterality: Left;   LOWER EXTREMITY ANGIOGRAPHY Right 01/25/2021   Procedure: LOWER EXTREMITY ANGIOGRAPHY;  Surgeon: Algernon Huxley, MD;  Location: [place] [facility];  Service: Cardiovascular;  Laterality: Right;   LOWER  EXTREMITY ANGIOGRAPHY Left 04/15/2021   Procedure: LOWER EXTREMITY ANGIOGRAPHY;  Surgeon: Algernon Huxley, MD;  Location: [place] [facility];  Service: Cardiovascular;  Laterality: Left;   LOWER EXTREMITY ANGIOGRAPHY Right 05/20/2021   Procedure: LOWER EXTREMITY ANGIOGRAPHY;  Surgeon: Algernon Huxley, MD;  Location: [place] [facility];  Service: Cardiovascular;  Laterality: Right;   LOWER EXTREMITY ANGIOGRAPHY Left 05/27/2021   Procedure: LOWER EXTREMITY ANGIOGRAPHY;  Surgeon: Algernon Huxley, MD;  Location: [place] [facility];  Service: Cardiovascular;  Laterality: Left;   pd cath  05/21/2019   TONSILLECTOMY     UPPER EXTREMITY ANGIOGRAPHY Left 04/15/2021   Procedure: UPPER EXTREMITY ANGIOGRAPHY;  Surgeon: Algernon Huxley, MD;  Location: [place] [facility];  Service: Cardiovascular;  Laterality: Left;   WOUND DEBRIDEMENT Bilateral 09/15/2019   Procedure: DEBRIDEMENT WOUND;  Surgeon: Benjamine Sprague, DO;  Location: [facility];  Service: General;  Laterality: Bilateral;    Prior to Admission medications   Medication Sig Start Date End Date Taking? Authorizing Provider  amLODipine (NORVASC) 10 MG tablet Take 10 mg by mouth daily.    [provider]  aspirin EC 81 MG EC tablet Take 1 tablet (81 mg total) by mouth daily. 02/16/16   Demetrios Loll, MD  atorvastatin (LIPITOR) 20 MG tablet Take 20 mg by mouth daily. 10/20/20   [provider]  calcium acetate (PHOSLO) 667 MG capsule Take 2.0 Capsule Oral three times a day with meal 01/22/21   [provider]  calcium citrate (CALCITRATE - DOSED IN MG ELEMENTAL CALCIUM) 950 (200 Ca) MG tablet Take 200 mg of elemental calcium by mouth 3 (three) times daily.    [provider]  carvedilol (COREG) 25 MG tablet Take 1 tablet (25 mg total) by mouth 2 (two) times daily with a meal. 02/20/18   Alisa Graff, FNP  Cholecalciferol (VITAMIN D) 50 MCG (2000 UT) CAPS Take by mouth daily.    [provider]  cilostazol (PLETAL) 100 MG tablet  Take 1 tablet (100 mg total) by mouth 2 (two) times daily before a meal. 05/07/21   Kris Hartmann, NP  clindamycin (CLEOCIN) 300 MG capsule Take 1 capsule (300 mg total) by mouth 3 (three) times daily. Patient not taking: Reported on 06/03/2021 06/02/21   Criselda Peaches, DPM  cloNIDine (CATAPRES) 0.2 MG tablet Take 0.2 mg by mouth 3 (three) times daily. 07/30/20   [provider]  clopidogrel (PLAVIX) 75 MG tablet Take 1 tablet (75 mg total) by mouth daily. 12/02/20   Algernon Huxley, MD  fluconazole (DIFLUCAN) 200 MG tablet Take 1 tablet (200 mg total) by mouth once a week for 4 doses. 06/16/21 07/08/21  McDonald, Stephan Minister, DPM  fluticasone (FLONASE) 50 MCG/ACT nasal spray Place into the nose.    [provider]  gentamicin cream (GARAMYCIN) 0.1 % APPLY TO EXIT SITE DAILY 01/08/20   [provider]  isosorbide mononitrate (IMDUR) 30 MG 24 hr tablet Take 1 tablet (30 mg total) by mouth 2 (two) times daily. 09/18/19   Fritzi Mandes, MD  KLOR-CON M20 20 MEQ tablet Take 20  mEq by mouth daily. 10/20/20   [provider]  losartan (COZAAR) 100 MG tablet Take 100 mg by mouth at bedtime. 03/22/21   [provider]  multivitamin (RENA-VIT) TABS tablet Take 1 tablet by mouth at bedtime. 09/18/19   Fritzi Mandes, MD  mupirocin ointment (BACTROBAN) 2 % Apply 1 application topically 2 (two) times daily. 02/18/21   McDonald, Stephan Minister, DPM  nitroGLYCERIN (NITROGLYN) 2 % OINT ointment Apply 1 application topically every 12 (twelve) hours. Applied just behind the toes on the left and right foot as needed 12/07/20 01/06/21  Criselda Peaches, DPM  oxyCODONE-acetaminophen (PERCOCET/ROXICET) 5-325 MG tablet Take 1 tablet by mouth every 6 (six) hours as needed for severe pain. 06/11/21   McDonald, Stephan Minister, DPM  povidone-iodine (RA POVIDINE IODINE) 10 % ointment Apply 1 application topically as needed for wound care. 07/07/21   McDonald, Stephan Minister, DPM  sodium thiosulfate 25 % injection Inject 12.5 g into  the vein once.    [provider]  torsemide (DEMADEX) 100 MG tablet Take 100 mg by mouth every morning. 10/20/20   [provider]  vitamin C (VITAMIN C) 500 MG tablet Take 1 tablet (500 mg total) by mouth 2 (two) times daily. 09/18/19   Fritzi Mandes, MD  Vitamin D, Ergocalciferol, (DRISDOL) 1.25 MG (50000 UNIT) CAPS capsule Take 50,000 Units by mouth once a week. 05/12/21   [provider]    Allergies Tomato, Tomato (diagnostic), and Gabapentin  Family History  Problem Relation Age of Onset   Hypertension Mother    Multiple sclerosis Mother    CAD Mother    Cancer Maternal Grandmother        Ovarian    Cancer Maternal Grandfather        Prostate    Kidney disease Father     Social History Social History   Tobacco Use   Smoking status: Former    Types: Cigars   Smokeless tobacco: Never  Vaping Use   Vaping Use: Never used  Substance Use Topics   Alcohol use: No    Alcohol/week: 0.0 standard drinks   Drug use: No    Review of Systems   Review of Systems  Constitutional:  Negative for chills and fever.  Respiratory:  Negative for shortness of breath.   Cardiovascular:  Negative for chest pain.  Gastrointestinal:  Positive for constipation, nausea and vomiting.  Genitourinary:  Negative for dysuria.  All other systems reviewed and are negative.  Physical Exam Updated Vital Signs BP (!) 88/67 (BP Location: Right Arm)   Pulse (!) 115   Temp 97.6 F (36.4 C) (Oral)   Resp 16   Ht '5\' 6"'$  (1.676 m)   Wt 136 kg   LMP  (LMP Unknown)   SpO2 100%   BMI 48.39 kg/m   Physical Exam Vitals and nursing note reviewed.  Constitutional:      General: She is not in acute distress.    Appearance: Normal appearance. She is ill-appearing.     Comments: Patient appears uncomfortable, ill-appearing  HENT:     Head: Normocephalic and atraumatic.     Nose: Nose normal.     Mouth/Throat:     Mouth: Mucous membranes are dry.  Eyes:     General: No  scleral icterus.    Conjunctiva/sclera: Conjunctivae normal.  Pulmonary:     Effort: Pulmonary effort is normal. No respiratory distress.     Breath sounds: No stridor.  Abdominal:  General: There is distension.     Palpations: Abdomen is soft.     Tenderness: There is no abdominal tenderness. There is no guarding.     Comments: PD catheter in place, no surrounding erythema, abdomen is distended, no focal tenderness  Musculoskeletal:        General: No deformity or signs of injury.     Cervical back: Normal range of motion.     Right lower leg: Edema present.     Left lower leg: Edema present.  Skin:    General: Skin is warm and dry.     Coloration: Skin is not jaundiced or pale.  Neurological:     General: No focal deficit present.     Mental Status: She is alert and oriented to person, place, and time. Mental status is at baseline.  Psychiatric:        Mood and Affect: Mood normal.        Behavior: Behavior normal.     LABS (all labs ordered are listed, but only abnormal results are displayed)  Labs Reviewed  BASIC METABOLIC PANEL - Abnormal; Notable for the following components:      Result Value   Sodium 132 (*)    Chloride 89 (*)    Glucose, Bld 139 (*)    BUN 55 (*)    Creatinine, Ser 14.18 (*)    GFR, Estimated 3 (*)    Anion gap 20 (*)    All other components within normal limits  CBC WITH DIFFERENTIAL/PLATELET - Abnormal; Notable for the following components:   WBC 21.6 (*)    Neutro Abs 16.8 (*)    Monocytes Absolute 1.1 (*)    Abs Immature Granulocytes 0.17 (*)    All other components within normal limits  LACTIC ACID, PLASMA - Abnormal; Notable for the following components:   Lactic Acid, Venous 3.0 (*)    All other components within normal limits  HEPATIC FUNCTION PANEL - Abnormal; Notable for the following components:   Total Protein 9.4 (*)    AST 14 (*)    Alkaline Phosphatase 164 (*)    All other components within normal limits  HCG,  QUANTITATIVE, PREGNANCY - Abnormal; Notable for the following components:   hCG, Beta Chain, Quant, S 5 (*)    All other components within normal limits  CULTURE, BLOOD (ROUTINE X 2)  CULTURE, BLOOD (ROUTINE X 2)  BRAIN NATRIURETIC PEPTIDE  LIPASE, BLOOD  URINALYSIS, COMPLETE (UACMP) WITH MICROSCOPIC  TYPE AND SCREEN   ____________________________________________  EKG  Right bundle branch block, sinus tachycardia, no acute ischemic changes ____________________________________________  RADIOLOGY Almeta Monas, personally viewed and evaluated these images (plain radiographs) as part of my medical decision making, as well as reviewing the written report by the radiologist.  ED MD interpretation: The chest x-ray does not show any acute cardiopulmonary process  CT abdomen pelvis which shows a large distended gallbladder with wall thickening    ____________________________________________   PROCEDURES  Procedure(s) performed (including Critical Care):  Procedures   ____________________________________________   INITIAL IMPRESSION / ASSESSMENT AND PLAN / ED COURSE     35 year old female history of end-stage renal disease on PD who presents with nausea vomiting hypotension.  Initial triage vital signs notable for hypoxia to 79% however on my evaluation she is satting 100% on room air I suspect that this was spurious value.  She is also notably hypotensive 70s over 60s.  And tachycardic.  She looks unwell, really her only focal  complaints are nausea vomiting.  On exam she is dry, abdomen is distended but nontender.  Of 21, lactate of 3, her BMP is fairly unremarkable, no hyperkalemia.  Blood culture sent.  Patient makes little urine so doubt that we will be able to collect this.  Her lipase and LFTs are normal.  Obtained a CT abdomen pelvis which is concerning for cholecystitis.  Will get a right upper quadrant ultrasound.  Will cover with Zosyn.  Given she is end-stage renal  disease I did not give her the full 30 cc/kg per sepsis protocol did not want to volume overload her.  She was given a liter of fluids and her blood pressure responded.  Will reassess. At the time of signout she is pending RUQ read.  Clinical Course as of 07/07/21 2353  Wed Jul 07, 2021  2159 Lactic Acid, Venous(!!): 3.0 [KM]    Clinical Course User Index [KM] Rada Hay, MD     ____________________________________________   FINAL CLINICAL IMPRESSION(S) / ED DIAGNOSES  Final diagnoses:  RUQ pain     ED Discharge Orders     None        Note:  This document was prepared using Dragon voice recognition software and may include unintentional dictation errors.    Rada Hay, MD 07/07/21 [phone]

## 2021-07-07 NOTE — ED Triage Notes (Signed)
Pt to ED via EMS from home c/o hypotension. Does PD at home and did yesterday, feeling weak and dizzy today.  Pt slow to answer questions and repeating answers in triage.  States generalized body pain and some SOB.  Pt pale, skin dry, chest rise even and unlabored.  Pt seen by Cannon Kettle, PA in triage.

## 2021-07-07 NOTE — ED Provider Notes (Signed)
Emergency Medicine Provider Triage Evaluation Note  [name], a 35 y.o. female  was evaluated in triage.  Pt complains of presents to the ED via EMS from home. She reports low BP and constipation. She performs at-home peritoneal dialysis, last yesterday. She is on fluid restrictions daily.   Review of Systems  Positive: Hypotension, fatigue, constipation Negative: CP  Physical Exam  There were no vitals taken for this visit. Gen:   Awake, no distress  NAD Resp:  Normal effort  MSK:   Moves extremities without difficulty  Other:  CVS: RRR  Medical Decision Making  Medically screening exam initiated at 6:24 PM.  Appropriate orders placed.  [name] was informed that the remainder of the evaluation will be completed by another provider, this initial triage assessment does not replace that evaluation, and the importance of remaining in the ED until their evaluation is complete.  Patient with ED evaluation of hypotension and constipation.    Melvenia Needles, PA-C 07/07/21 Mariam Dollar, MD 07/08/21 1100

## 2021-07-07 NOTE — ED Notes (Signed)
Dr. Starleen Blue notified of critical labs.

## 2021-07-08 ENCOUNTER — Inpatient Hospital Stay: Payer: Self-pay | Admitting: Hematology and Oncology

## 2021-07-08 DIAGNOSIS — I10 Essential (primary) hypertension: Secondary | ICD-10-CM

## 2021-07-08 DIAGNOSIS — R0902 Hypoxemia: Secondary | ICD-10-CM | POA: Diagnosis present

## 2021-07-08 DIAGNOSIS — Z992 Dependence on renal dialysis: Secondary | ICD-10-CM | POA: Diagnosis not present

## 2021-07-08 DIAGNOSIS — N186 End stage renal disease: Secondary | ICD-10-CM

## 2021-07-08 DIAGNOSIS — A419 Sepsis, unspecified organism: Secondary | ICD-10-CM | POA: Diagnosis not present

## 2021-07-08 DIAGNOSIS — J9601 Acute respiratory failure with hypoxia: Secondary | ICD-10-CM

## 2021-07-08 DIAGNOSIS — E860 Dehydration: Secondary | ICD-10-CM | POA: Diagnosis present

## 2021-07-08 DIAGNOSIS — K5900 Constipation, unspecified: Secondary | ICD-10-CM | POA: Diagnosis present

## 2021-07-08 DIAGNOSIS — E1122 Type 2 diabetes mellitus with diabetic chronic kidney disease: Secondary | ICD-10-CM | POA: Diagnosis present

## 2021-07-08 DIAGNOSIS — E282 Polycystic ovarian syndrome: Secondary | ICD-10-CM | POA: Diagnosis present

## 2021-07-08 DIAGNOSIS — Z6841 Body Mass Index (BMI) 40.0 and over, adult: Secondary | ICD-10-CM | POA: Diagnosis not present

## 2021-07-08 DIAGNOSIS — E871 Hypo-osmolality and hyponatremia: Secondary | ICD-10-CM | POA: Diagnosis present

## 2021-07-08 DIAGNOSIS — I5032 Chronic diastolic (congestive) heart failure: Secondary | ICD-10-CM | POA: Diagnosis present

## 2021-07-08 DIAGNOSIS — I959 Hypotension, unspecified: Secondary | ICD-10-CM | POA: Diagnosis present

## 2021-07-08 DIAGNOSIS — I132 Hypertensive heart and chronic kidney disease with heart failure and with stage 5 chronic kidney disease, or end stage renal disease: Secondary | ICD-10-CM | POA: Diagnosis present

## 2021-07-08 DIAGNOSIS — K819 Cholecystitis, unspecified: Secondary | ICD-10-CM | POA: Diagnosis not present

## 2021-07-08 DIAGNOSIS — Z713 Dietary counseling and surveillance: Secondary | ICD-10-CM | POA: Diagnosis not present

## 2021-07-08 DIAGNOSIS — Z89422 Acquired absence of other left toe(s): Secondary | ICD-10-CM | POA: Diagnosis not present

## 2021-07-08 DIAGNOSIS — Z20822 Contact with and (suspected) exposure to covid-19: Secondary | ICD-10-CM | POA: Diagnosis present

## 2021-07-08 DIAGNOSIS — R1011 Right upper quadrant pain: Secondary | ICD-10-CM | POA: Diagnosis present

## 2021-07-08 DIAGNOSIS — E872 Acidosis: Secondary | ICD-10-CM | POA: Diagnosis present

## 2021-07-08 DIAGNOSIS — Z841 Family history of disorders of kidney and ureter: Secondary | ICD-10-CM | POA: Diagnosis not present

## 2021-07-08 DIAGNOSIS — R6520 Severe sepsis without septic shock: Secondary | ICD-10-CM

## 2021-07-08 DIAGNOSIS — Z8249 Family history of ischemic heart disease and other diseases of the circulatory system: Secondary | ICD-10-CM | POA: Diagnosis not present

## 2021-07-08 DIAGNOSIS — E1151 Type 2 diabetes mellitus with diabetic peripheral angiopathy without gangrene: Secondary | ICD-10-CM | POA: Diagnosis present

## 2021-07-08 DIAGNOSIS — D631 Anemia in chronic kidney disease: Secondary | ICD-10-CM

## 2021-07-08 DIAGNOSIS — Z89421 Acquired absence of other right toe(s): Secondary | ICD-10-CM | POA: Diagnosis not present

## 2021-07-08 DIAGNOSIS — N2581 Secondary hyperparathyroidism of renal origin: Secondary | ICD-10-CM | POA: Diagnosis present

## 2021-07-08 DIAGNOSIS — J45909 Unspecified asthma, uncomplicated: Secondary | ICD-10-CM | POA: Diagnosis present

## 2021-07-08 LAB — HEPATITIS B SURFACE ANTIGEN: Hepatitis B Surface Ag: NONREACTIVE

## 2021-07-08 LAB — CBC
HCT: 34.6 % — ABNORMAL LOW (ref 36.0–46.0)
Hemoglobin: 11.4 g/dL — ABNORMAL LOW (ref 12.0–15.0)
MCH: 31.8 pg (ref 26.0–34.0)
MCHC: 32.9 g/dL (ref 30.0–36.0)
MCV: 96.4 fL (ref 80.0–100.0)
Platelets: 173 10*3/uL (ref 150–400)
RBC: 3.59 MIL/uL — ABNORMAL LOW (ref 3.87–5.11)
RDW: 14.9 % (ref 11.5–15.5)
WBC: 21.9 10*3/uL — ABNORMAL HIGH (ref 4.0–10.5)
nRBC: 0 % (ref 0.0–0.2)

## 2021-07-08 LAB — BODY FLUID CELL COUNT WITH DIFFERENTIAL
Eos, Fluid: 0 %
Lymphs, Fluid: 47 %
Monocyte-Macrophage-Serous Fluid: 41 %
Neutrophil Count, Fluid: 12 %
Other Cells, Fluid: 0 %
Total Nucleated Cell Count, Fluid: 81 cu mm

## 2021-07-08 LAB — COMPREHENSIVE METABOLIC PANEL
ALT: 10 U/L (ref 0–44)
AST: 16 U/L (ref 15–41)
Albumin: 3.4 g/dL — ABNORMAL LOW (ref 3.5–5.0)
Alkaline Phosphatase: 137 U/L — ABNORMAL HIGH (ref 38–126)
Anion gap: 21 — ABNORMAL HIGH (ref 5–15)
BUN: 63 mg/dL — ABNORMAL HIGH (ref 6–20)
CO2: 23 mmol/L (ref 22–32)
Calcium: 9.4 mg/dL (ref 8.9–10.3)
Chloride: 86 mmol/L — ABNORMAL LOW (ref 98–111)
Creatinine, Ser: 14.62 mg/dL — ABNORMAL HIGH (ref 0.44–1.00)
GFR, Estimated: 3 mL/min — ABNORMAL LOW (ref >60)
Glucose, Bld: 97 mg/dL (ref 70–99)
Potassium: 3.3 mmol/L — ABNORMAL LOW (ref 3.5–5.1)
Sodium: 130 mmol/L — ABNORMAL LOW (ref 135–145)
Total Bilirubin: 0.9 mg/dL (ref 0.3–1.2)
Total Protein: 8.6 g/dL — ABNORMAL HIGH (ref 6.5–8.1)

## 2021-07-08 LAB — PROTIME-INR
INR: 1.2 (ref 0.8–1.2)
Prothrombin Time: 15.2 seconds (ref 11.4–15.2)

## 2021-07-08 LAB — RESP PANEL BY RT-PCR (FLU A&B, COVID) ARPGX2
Influenza A by PCR: NEGATIVE
Influenza B by PCR: NEGATIVE
SARS Coronavirus 2 by RT PCR: NEGATIVE

## 2021-07-08 LAB — PATHOLOGIST SMEAR REVIEW

## 2021-07-08 LAB — PROCALCITONIN: Procalcitonin: 0.51 ng/mL

## 2021-07-08 LAB — CORTISOL-AM, BLOOD: Cortisol - AM: 12.7 ug/dL (ref 6.7–22.6)

## 2021-07-08 LAB — LACTIC ACID, PLASMA: Lactic Acid, Venous: 2.9 mmol/L (ref 0.5–1.9)

## 2021-07-08 LAB — HIV ANTIBODY (ROUTINE TESTING W REFLEX): HIV Screen 4th Generation wRfx: NONREACTIVE

## 2021-07-08 MED ORDER — ONDANSETRON HCL 4 MG PO TABS: 4.0000 mg | ORAL_TABLET | Freq: Four times a day (QID) | ORAL | Status: DC | PRN

## 2021-07-08 MED ORDER — GENTAMICIN SULFATE 0.1 % EX CREA
1.0000 "application " | TOPICAL_CREAM | Freq: Every day | CUTANEOUS | Status: DC
  Filled 2021-07-08 (×2): qty 15

## 2021-07-08 MED ORDER — ATORVASTATIN CALCIUM 20 MG PO TABS
20.0000 mg | ORAL_TABLET | Freq: Every day | ORAL | Status: DC
  Administered 2021-07-08 – 2021-07-09 (×2): 20 mg via ORAL
  Filled 2021-07-08 (×2): qty 1

## 2021-07-08 MED ORDER — SODIUM CHLORIDE 0.9 % IV SOLN
2.0000 g | Freq: Once | INTRAVENOUS | Status: AC
  Administered 2021-07-08: 2 g via INTRAVENOUS
  Filled 2021-07-08: qty 2

## 2021-07-08 MED ORDER — DELFLEX-LC/1.5% DEXTROSE 344 MOSM/L IP SOLN
INTRAPERITONEAL | Status: DC
  Administered 2021-07-08: 6000 mL via INTRAPERITONEAL
  Filled 2021-07-08 (×3): qty 3000

## 2021-07-08 MED ORDER — CHLORHEXIDINE GLUCONATE CLOTH 2 % EX PADS
6.0000 | MEDICATED_PAD | Freq: Every day | CUTANEOUS | Status: DC
  Administered 2021-07-09: 6 via TOPICAL

## 2021-07-08 MED ORDER — ASPIRIN EC 81 MG PO TBEC
81.0000 mg | DELAYED_RELEASE_TABLET | Freq: Every day | ORAL | Status: DC
  Administered 2021-07-08 – 2021-07-09 (×2): 81 mg via ORAL
  Filled 2021-07-08 (×2): qty 1

## 2021-07-08 MED ORDER — HEPARIN 1000 UNIT/ML FOR PERITONEAL DIALYSIS
500.0000 [IU] | INTRAMUSCULAR | Status: DC | PRN
  Filled 2021-07-08: qty 0.5

## 2021-07-08 MED ORDER — CALCIUM ACETATE (PHOS BINDER) 667 MG PO CAPS
1334.0000 mg | ORAL_CAPSULE | Freq: Three times a day (TID) | ORAL | Status: DC
  Administered 2021-07-08 (×2): 1334 mg via ORAL
  Filled 2021-07-08 (×5): qty 2

## 2021-07-08 MED ORDER — SODIUM CHLORIDE 0.9 % IV SOLN
2.0000 g | INTRAVENOUS | Status: DC
  Administered 2021-07-09: 2 g via INTRAVENOUS
  Filled 2021-07-08: qty 2
  Filled 2021-07-08: qty 20

## 2021-07-08 MED ORDER — SODIUM CHLORIDE 0.9 % IV SOLN: 1.0000 g | Freq: Every day | INTRAVENOUS | Status: DC

## 2021-07-08 MED ORDER — HEPARIN SODIUM (PORCINE) 5000 UNIT/ML IJ SOLN
5000.0000 [IU] | Freq: Three times a day (TID) | INTRAMUSCULAR | Status: DC
  Administered 2021-07-08 – 2021-07-09 (×4): 5000 [IU] via SUBCUTANEOUS
  Filled 2021-07-08 (×4): qty 1

## 2021-07-08 MED ORDER — ONDANSETRON HCL 4 MG/2ML IJ SOLN: 4.0000 mg | Freq: Four times a day (QID) | INTRAMUSCULAR | Status: DC | PRN

## 2021-07-08 MED ORDER — METRONIDAZOLE 500 MG/100ML IV SOLN
500.0000 mg | Freq: Two times a day (BID) | INTRAVENOUS | Status: DC
Start: 2021-07-08 — End: 2021-07-09
  Administered 2021-07-08 – 2021-07-09 (×3): 500 mg via INTRAVENOUS
  Filled 2021-07-08 (×5): qty 100

## 2021-07-08 NOTE — Progress Notes (Signed)
The patient's sexual history, as she reported to me, suggests an extremely low pre-test probability of pregnancy.  A quantitative Hcg of 5 mIU/mL effectively rules this out.  In simple terms, the patient is not pregnant.

## 2021-07-08 NOTE — H&P (Signed)
History and Physical   [name] [id number] DOB: 14-Jul-1986 DOA: 07/07/2021  Referring MD/NP/PA: Dr. Archie Balboa  PCP: Danelle Berry, NP   Patient coming from: Home  Chief Complaint: Nausea vomiting and fever  HPI: [name] is a 35 y.o. female with medical history significant of end-stage renal disease on peritoneal dialysis, diabetes, hypertension, morbid obesity, asthma, anemia of chronic disease, diastolic dysfunction CHF who presents to the ER with intractable nausea vomiting and some mild abdominal discomfort.  Patient started having symptoms in the last 2 days but has gotten worse today.  Associated with high fever.  No sick contact.  No diarrhea, no hematemesis or melena no bright red blood per rectum.  Patient has been unable to tolerate oral medications.  She has had peripheral vascular disease with several toes currently amputated.  She has no prior cardiac history.  Patient was seen and evaluated.  She meets sepsis criteria.  Suspicion for cholelithiasis or cholecystitis as a source.  No evidence of spontaneous bacterial peritonitis at this point.  Patient is being admitted to the hospital for further evaluation and treatment..  ED Course: Temperature is 97.6 blood pressure 78/53, pulse 115 respiratory 23 oxygen sat 74% on room air.  White count 21.6 hemoglobin 12.2 and platelets 213.  Sodium 132 potassium 3.6 chloride 89 CO2 23 BUN 55 creatinine 14.18 glucose 139.  Calcium 9.6.  Lipase is 43.  Gap of 20.  Lactic acid was 3.0 and then 3.1.  Respiratory panel currently pending.  CT abdomen pelvis showed distended gallbladder with wall thickening up to 7 mm.  Possibly acute cholecystitis.  No biliary dilatation.  Peritoneal dialysis catheter in place.  Abdominal ultrasound shows stone filled gallbladder without any evidence of acute cholecystitis.  Hepatobiliary scan recommended.  Patient being admitted with severe sepsis probably due to gallbladder disease.  Review of  Systems: As per HPI otherwise 10 point review of systems negative.    Past Medical History:  Diagnosis Date   Anemia of chronic disease    Asthma    Calciphylaxis 2020   CHF (congestive heart failure) (HCC)    CKD (chronic kidney disease), stage III ([place])    Diabetes ([place])    type 2   Hypertension    Morbid obesity [facility])     Past Surgical History:  Procedure Laterality Date   A/V FISTULAGRAM Left 10/29/2020   Procedure: A/V FISTULAGRAM;  Surgeon: Algernon Huxley, MD;  Location: [place] [facility];  Service: Cardiovascular;  Laterality: Left;   APPLICATION OF WOUND VAC Bilateral 09/15/2019   Procedure: APPLICATION OF WOUND VAC;  Surgeon: Benjamine Sprague, DO;  Location: [facility];  Service: General;  Laterality: Bilateral;   AV FISTULA PLACEMENT Left 07/09/2020   Procedure: ARTERIOVENOUS (AV) FISTULA CREATION ( BRACHIAL CEPHALIC);  Surgeon: Algernon Huxley, MD;  Location: [facility];  Service: Vascular;  Laterality: Left;   DIALYSIS/PERMA CATHETER INSERTION N/A 02/28/2019   Procedure: DIALYSIS/PERMA CATHETER INSERTION;  Surgeon: Algernon Huxley, MD;  Location: [place] [facility];  Service: Cardiovascular;  Laterality: N/A;   DIALYSIS/PERMA CATHETER INSERTION N/A 09/17/2019   Procedure: DIALYSIS/PERMA CATHETER EXCHANGE;  Surgeon: Katha Cabal, MD;  Location: [place] [facility];  Service: Cardiovascular;  Laterality: N/A;   DIALYSIS/PERMA CATHETER REMOVAL N/A 03/18/2020   Procedure: DIALYSIS/PERMA CATHETER REMOVAL;  Surgeon: Algernon Huxley, MD;  Location: [place] [facility];  Service: Cardiovascular;  Laterality: N/A;   LIGATION OF ARTERIOVENOUS  FISTULA Left 06/03/2021   Procedure: LIGATION  OF ARTERIOVENOUS  FISTULA;  Surgeon: Algernon Huxley, MD;  Location: [facility];  Service: Vascular;  Laterality: Left;   LOWER EXTREMITY ANGIOGRAPHY Left 12/02/2020   Procedure: LOWER EXTREMITY ANGIOGRAPHY;  Surgeon: Algernon Huxley, MD;  Location: [place] [facility];  Service: Cardiovascular;  Laterality: Left;    LOWER EXTREMITY ANGIOGRAPHY Right 01/25/2021   Procedure: LOWER EXTREMITY ANGIOGRAPHY;  Surgeon: Algernon Huxley, MD;  Location: [place] [facility];  Service: Cardiovascular;  Laterality: Right;   LOWER EXTREMITY ANGIOGRAPHY Left 04/15/2021   Procedure: LOWER EXTREMITY ANGIOGRAPHY;  Surgeon: Algernon Huxley, MD;  Location: [place] [facility];  Service: Cardiovascular;  Laterality: Left;   LOWER EXTREMITY ANGIOGRAPHY Right 05/20/2021   Procedure: LOWER EXTREMITY ANGIOGRAPHY;  Surgeon: Algernon Huxley, MD;  Location: [place] [facility];  Service: Cardiovascular;  Laterality: Right;   LOWER EXTREMITY ANGIOGRAPHY Left 05/27/2021   Procedure: LOWER EXTREMITY ANGIOGRAPHY;  Surgeon: Algernon Huxley, MD;  Location: [place] [facility];  Service: Cardiovascular;  Laterality: Left;   pd cath  05/21/2019   TONSILLECTOMY     UPPER EXTREMITY ANGIOGRAPHY Left 04/15/2021   Procedure: UPPER EXTREMITY ANGIOGRAPHY;  Surgeon: Algernon Huxley, MD;  Location: [place] [facility];  Service: Cardiovascular;  Laterality: Left;   WOUND DEBRIDEMENT Bilateral 09/15/2019   Procedure: DEBRIDEMENT WOUND;  Surgeon: Benjamine Sprague, DO;  Location: [facility];  Service: General;  Laterality: Bilateral;     reports that she has quit smoking. Her smoking use included cigars. She has never used smokeless tobacco. She reports that she does not drink alcohol and does not use drugs.  Allergies  Allergen Reactions   Tomato Itching   Tomato (Diagnostic) Itching   Gabapentin Other (See Comments)    Extremity tremors    Family History  Problem Relation Age of Onset   Hypertension Mother    Multiple sclerosis Mother    CAD Mother    Cancer Maternal Grandmother        Ovarian    Cancer Maternal Grandfather        Prostate    Kidney disease Father      Prior to Admission medications   Medication Sig Start Date End Date Taking? Authorizing Provider  amLODipine (NORVASC) 10 MG tablet Take 10 mg by mouth daily.    [provider]   aspirin EC 81 MG EC tablet Take 1 tablet (81 mg total) by mouth daily. 02/16/16   Demetrios Loll, MD  atorvastatin (LIPITOR) 20 MG tablet Take 20 mg by mouth daily. 10/20/20   [provider]  calcium acetate (PHOSLO) 667 MG capsule Take 2.0 Capsule Oral three times a day with meal 01/22/21   [provider]  calcium citrate (CALCITRATE - DOSED IN MG ELEMENTAL CALCIUM) 950 (200 Ca) MG tablet Take 200 mg of elemental calcium by mouth 3 (three) times daily.    [provider]  carvedilol (COREG) 25 MG tablet Take 1 tablet (25 mg total) by mouth 2 (two) times daily with a meal. 02/20/18   Alisa Graff, FNP  Cholecalciferol (VITAMIN D) 50 MCG (2000 UT) CAPS Take by mouth daily.    [provider]  cilostazol (PLETAL) 100 MG tablet Take 1 tablet (100 mg total) by mouth 2 (two) times daily before a meal. 05/07/21   Kris Hartmann, NP  clindamycin (CLEOCIN) 300 MG capsule Take 1 capsule (300 mg total) by mouth 3 (three) times daily. Patient not taking: Reported on 06/03/2021 06/02/21  Criselda Peaches, DPM  cloNIDine (CATAPRES) 0.2 MG tablet Take 0.2 mg by mouth 3 (three) times daily. 07/30/20   [provider]  clopidogrel (PLAVIX) 75 MG tablet Take 1 tablet (75 mg total) by mouth daily. 12/02/20   Algernon Huxley, MD  fluconazole (DIFLUCAN) 200 MG tablet Take 1 tablet (200 mg total) by mouth once a week for 4 doses. 06/16/21 07/08/21  McDonald, Stephan Minister, DPM  fluticasone (FLONASE) 50 MCG/ACT nasal spray Place into the nose.    [provider]  gentamicin cream (GARAMYCIN) 0.1 % APPLY TO EXIT SITE DAILY 01/08/20   [provider]  isosorbide mononitrate (IMDUR) 30 MG 24 hr tablet Take 1 tablet (30 mg total) by mouth 2 (two) times daily. 09/18/19   Fritzi Mandes, MD  KLOR-CON M20 20 MEQ tablet Take 20 mEq by mouth daily. 10/20/20   [provider]  losartan (COZAAR) 100 MG tablet Take 100 mg by mouth at bedtime. 03/22/21   [provider]   multivitamin (RENA-VIT) TABS tablet Take 1 tablet by mouth at bedtime. 09/18/19   Fritzi Mandes, MD  mupirocin ointment (BACTROBAN) 2 % Apply 1 application topically 2 (two) times daily. 02/18/21   McDonald, Stephan Minister, DPM  nitroGLYCERIN (NITROGLYN) 2 % OINT ointment Apply 1 application topically every 12 (twelve) hours. Applied just behind the toes on the left and right foot as needed 12/07/20 01/06/21  Criselda Peaches, DPM  oxyCODONE-acetaminophen (PERCOCET/ROXICET) 5-325 MG tablet Take 1 tablet by mouth every 6 (six) hours as needed for severe pain. 06/11/21   McDonald, Stephan Minister, DPM  povidone-iodine (RA POVIDINE IODINE) 10 % ointment Apply 1 application topically as needed for wound care. 07/07/21   McDonald, Stephan Minister, DPM  sodium thiosulfate 25 % injection Inject 12.5 g into the vein once.    [provider]  torsemide (DEMADEX) 100 MG tablet Take 100 mg by mouth every morning. 10/20/20   [provider]  vitamin C (VITAMIN C) 500 MG tablet Take 1 tablet (500 mg total) by mouth 2 (two) times daily. 09/18/19   Fritzi Mandes, MD  Vitamin D, Ergocalciferol, (DRISDOL) 1.25 MG (50000 UNIT) CAPS capsule Take 50,000 Units by mouth once a week. 05/12/21   [provider]    Physical Exam: Vitals:   07/07/21 2200 07/07/21 2230 07/07/21 2300 07/07/21 2330  BP: (!) 105/49 102/70 130/85 90/76  Pulse: (!) 41 (!) 41 (!) 101 (!) 102  Resp: 12 (!) '23 19 11  '$ Temp:      TempSrc:      SpO2: (!) 74% 94% 97% 98%  Weight:      Height:          Constitutional: Morbidly obese, stable, no distress Vitals:   07/07/21 2200 07/07/21 2230 07/07/21 2300 07/07/21 2330  BP: (!) 105/49 102/70 130/85 90/76  Pulse: (!) 41 (!) 41 (!) 101 (!) 102  Resp: 12 (!) '23 19 11  '$ Temp:      TempSrc:      SpO2: (!) 74% 94% 97% 98%  Weight:      Height:       Eyes: PERRL, lids and conjunctivae normal ENMT: Mucous membranes are dry. Posterior pharynx clear of any exudate or lesions.Normal dentition.  Neck:  normal, supple, no masses, no thyromegaly Respiratory: clear to auscultation bilaterally, no wheezing, no crackles. Normal respiratory effort. No accessory muscle use.  Cardiovascular: Sinus tachycardia, no murmurs / rubs / gallops. No extremity edema. 2+ pedal pulses. No carotid  bruits.  Abdomen: Obese, mildly distended, mild right upper quadrant tenderness, no masses palpated. No hepatosplenomegaly. Bowel sounds positive.  Musculoskeletal: no clubbing / cyanosis. No joint deformity upper and lower extremities. Good ROM, no contractures. Normal muscle tone.  Skin: no rashes, lesions, ulcers. No induration Neurologic: CN 2-12 grossly intact. Sensation intact, DTR normal. Strength 5/5 in all 4.  Psychiatric: Normal judgment and insight. Alert and oriented x 3. Normal mood.     Labs on Admission: I have personally reviewed following labs and imaging studies  CBC: Recent Labs  Lab 07/07/21 1931  WBC 21.6*  NEUTROABS 16.8*  HGB 12.2  HCT 38.5  MCV 95.3  PLT [id number]   Basic Metabolic Panel: Recent Labs  Lab 07/07/21 1931  NA 132*  K 3.6  CL 89*  CO2 23  GLUCOSE 139*  BUN 55*  CREATININE 14.18*  CALCIUM 9.6   GFR: Estimated Creatinine Clearance: 7.8 mL/min (A) (by C-G formula based on SCr of 14.18 mg/dL (H)). Liver Function Tests: Recent Labs  Lab 07/07/21 1931  AST 14*  ALT 9  ALKPHOS 164*  BILITOT 0.8  PROT 9.4*  ALBUMIN 3.7   Recent Labs  Lab 07/07/21 1931  LIPASE 43   No results for input(s): AMMONIA in the last 168 hours. Coagulation Profile: No results for input(s): INR, PROTIME in the last 168 hours. Cardiac Enzymes: No results for input(s): CKTOTAL, CKMB, CKMBINDEX, TROPONINI in the last 168 hours. BNP (last 3 results) No results for input(s): PROBNP in the last 8760 hours. HbA1C: No results for input(s): HGBA1C in the last 72 hours. CBG: No results for input(s): GLUCAP in the last 168 hours. Lipid Profile: No results for input(s): CHOL, HDL, LDLCALC,  TRIG, CHOLHDL, LDLDIRECT in the last 72 hours. Thyroid Function Tests: No results for input(s): TSH, T4TOTAL, FREET4, T3FREE, THYROIDAB in the last 72 hours. Anemia Panel: No results for input(s): VITAMINB12, FOLATE, FERRITIN, TIBC, IRON, RETICCTPCT in the last 72 hours. Urine analysis:    Component Value Date/Time   COLORURINE YELLOW (A) 02/11/2016 0200   APPEARANCEUR CLEAR (A) 02/11/2016 0200   LABSPEC 1.011 02/11/2016 0200   PHURINE 5.0 02/11/2016 0200   GLUCOSEU 50 (A) 02/11/2016 0200   HGBUR NEGATIVE 02/11/2016 0200   BILIRUBINUR NEGATIVE 02/11/2016 0200   KETONESUR NEGATIVE 02/11/2016 0200   PROTEINUR >500 (A) 02/11/2016 0200   NITRITE NEGATIVE 02/11/2016 0200   LEUKOCYTESUR NEGATIVE 02/11/2016 0200   Sepsis Labs: '@LABRCNTIP'$ (procalcitonin:4,lacticidven:4) )No results found for this or any previous visit (from the past 240 hour(s)).   Radiological Exams on Admission: DG Chest 1 View  Result Date: 07/07/2021 CLINICAL DATA:  Hypotension. EXAM: CHEST  1 VIEW COMPARISON:  Chest radiograph dated 07/02/2019. FINDINGS: Cardiomegaly with vascular congestion. No focal consolidation, pleural effusion, or pneumothorax. Evaluation however is limited as the left lung base and left costophrenic angle has been excluded from the image. No acute osseous pathology. IMPRESSION: Cardiomegaly with mild vascular congestion.  No focal consolidation. Electronically Signed   By: Anner Crete M.D.   On: 07/07/2021 20:10   CT ABDOMEN PELVIS W CONTRAST  Result Date: 07/07/2021 CLINICAL DATA:  Abdominal pain and fever. EXAM: CT ABDOMEN AND PELVIS WITH CONTRAST TECHNIQUE: Multidetector CT imaging of the abdomen and pelvis was performed using the standard protocol following bolus administration of intravenous contrast. CONTRAST:  194m OMNIPAQUE IOHEXOL 350 MG/ML SOLN COMPARISON:  None. FINDINGS: Lower chest: No pleural effusion or focal airspace disease. Mitral annulus calcifications. Coronary artery  calcifications, age advanced. Hepatobiliary: Enlarged liver  spanning 22.5 cm cranial caudal with steatosis. No focal hepatic lesion. Hepatic contours are slightly nodular. Distended gallbladder with wall thickening up to 7 mm. Cholesterol as well as calcified gallstones in the gallbladder. No biliary dilatation or visualized choledocholithiasis. Pancreas: No ductal dilatation or inflammation. Spleen: Normal in size without focal abnormality. 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 No left adrenal nodule. Marked bilateral renal atrophy consistent with chronic renal disease. No hydronephrosis. Urinary bladder completely empty. Stomach/Bowel: No bowel obstruction or inflammation. Moderate colonic stool burden and tortuosity. Appendix tentatively visualized and normal. Majority of small bowel is decompressed. Fluid or ingested contents within the stomach. Vascular/Lymphatic: Advanced aortic and branch atherosclerosis, particularly for age. No aortic aneurysm. No acute vascular findings. Multiple small retroperitoneal lymph nodes, likely reactive. Prominent sized bilateral inguinal nodes have normal fatty hila. No suspicious abdominopelvic adenopathy. Reproductive: Other than uterine vascular calcifications, unremarkable CT appearance of the uterus. No adnexal mass. Other: Peritoneal dialysis catheter in place, tip in the right pelvis. Moderate volume of free fluid throughout the abdomen and pelvis. No loculated fluid collection. Amorphous soft tissue calcifications in the anterior abdomen just beneath the abdominal wall musculature may be dystrophic or sequela of fat necrosis. No free intra-abdominal air. Scattered body wall edema. Musculoskeletal: Sequela of renal osteodystrophy. No acute osseous abnormalities. IMPRESSION: 1. Distended gallbladder with wall thickening up to 7 mm. This could represent acute cholecystitis in the appropriate clinical setting.  Cholesterol as well as calcified gallstones in the gallbladder. No biliary dilatation or visualized choledocholithiasis. 2. Hepatomegaly and hepatic steatosis. Hepatic contours are slightly nodular, raising concern for cirrhosis. 3. Peritoneal dialysis catheter in place, tip in the right pelvis. Moderate volume of free fluid throughout the abdomen and pelvis. 4. Marked bilateral renal atrophy consistent with chronic renal disease. Aortic Atherosclerosis (ICD10-I70.0). Electronically Signed   By: Keith Rake M.D.   On: 07/07/2021 21:52   US Abdomen Limited RUQ (LIVER/GB)  Result Date: 07/07/2021 CLINICAL DATA:  Right upper quadrant abdominal pain. EXAM: ULTRASOUND ABDOMEN LIMITED RIGHT UPPER QUADRANT COMPARISON:  CT abdomen pelvis dated 07/07/2021. FINDINGS: Gallbladder: The gallbladder is filled with stones. The gallbladder wall is minimally thickened measuring 4 mm. Negative sonographic Murphy's sign. Common bile duct: Diameter: 3 mm Liver: There is mild coarsened echotexture and surface irregularity concerning for changes of cirrhosis. Portal vein is patent on color Doppler imaging with normal direction of blood flow towards the liver. Other: Small ascites. IMPRESSION: 1. Stone filled gallbladder without definite sonographic evidence of acute cholecystitis. A hepatobiliary scintigraphy may provide better evaluation of the gallbladder if there is a high clinical concern for acute cholecystitis . 2. Possible early changes of cirrhosis. 3. Small ascites. Electronically Signed   By: Anner Crete M.D.   On: 07/07/2021 23:28      Assessment/Plan Principal Problem:   Severe sepsis (HCC) Active Problems:   Anemia in chronic kidney disease   HTN (hypertension)   ESRD (end stage renal disease) (HCC)   Chronic diastolic CHF (congestive heart failure) ([place])   Essential hypertension   Cholecystitis, unspecified     #1 severe sepsis: Patient meets sepsis criteria with a white count, tachycardia,  and severe sepsis with hypoxemia.  The likely cause is acute cholecystitis.  Patient will be admitted.  Initiate IV cefepime.  Obtain HIDA scan as recommended.  Depend on the results GI and possible surgical consultation.  Continue supportive care.  No IV fluids due to end-stage renal disease.  #2 acute cholecystitis:  Suspected based on CT and ultrasound.  Follow HIDA scan results.  Complete NPO.  #3 end-stage renal disease: On peritoneal dialysis.  Nephrology consult for dialysis inpatient.  #4 essential hypertension: We will confirm on continue home regimen once med rec is performed.  #5 chronic diastolic CHF: Appears compensated  #6 morbid obesity: Dietary counseling  #7 anemia of chronic disease: H&H stable   DVT prophylaxis: Heparin Code Status: Full code Family Communication: No family at bedside Disposition Plan: Home Consults called: Nephrology Admission status: Inpatient  Severity of Illness: The appropriate patient status for this patient is INPATIENT. Inpatient status is judged to be reasonable and necessary in order to provide the required intensity of service to ensure the patient's safety. The patient's presenting symptoms, physical exam findings, and initial radiographic and laboratory data in the context of their chronic comorbidities is felt to place them at high risk for further clinical deterioration. Furthermore, it is not anticipated that the patient will be medically stable for discharge from the hospital within 2 midnights of admission. The following factors support the patient status of inpatient.   " The patient's presenting symptoms include nausea with vomiting. " The worrisome physical exam findings include mild tenderness. " The initial radiographic and laboratory data are worrisome because of evidence of sepsis with possible gallbladder disease. " The chronic co-morbidities include end-stage renal disease.   * I certify that at the point of admission it is  my clinical judgment that the patient will require inpatient hospital care spanning beyond 2 midnights from the point of admission due to high intensity of service, high risk for further deterioration and high frequency of surveillance required.Barbette Merino MD [facility] Pager [phone]  If 7PM-7AM, please contact night-coverage [email or web address] Password [facility]  07/08/2021, 12:21 AM

## 2021-07-08 NOTE — Progress Notes (Signed)
Mother at bedside, left foot cleaned with iodine sticks, wrapped with gauze and ace bandage. Provided clean gown and bathing supplies.

## 2021-07-08 NOTE — Sepsis Progress Note (Signed)
Requested follow up [place] from MD, see new orders

## 2021-07-08 NOTE — Progress Notes (Signed)
PROGRESS NOTE    [name]  [id number] DOB: [birth year] DOA: 07/07/2021 PCP: Danelle Berry, NP      Brief Narrative:  This is an unfortunate35 y.o. F with ESRD on peritoneal dialysis, hx calciphylaxis, PCOS and MO, asthma, DM, HTN, dCHF, and recent right 5th toe and LEFT 2-5th toe amputations who presented with nausea, vomiting, abdominal discomfort and fever for 2 days.  In the ER, blood pressure 78/53, pulse 115, respirations 23, lactate 3.1, WBC 21K.  CT abdomen and pelvis showed a distended gallbladder with some wall thickening.        Assessment & Plan:  Severe sepsis Patient presented with hypotension, tachycardia, tachypnea, elevated lactic acid, leukocytosis, and possible cholecystitis.  Amazingly she appears very benign this morning, her belly is soft and she has no right upper quadrant pain.  - Continue antibiotics, narrow to Rocephin and flagyl empirically for now - Follow cultures - Obtain HIDA scan, and if abnormal, will consult Surgery, whom I have spoken to already - Obtain cell count (and culture) from PD catheter, Nephrology will coordinate this      ESRD - PD per nephrology  Hyponatremia  DCHF Peripheral vascular disease -Hold torsemide, cilostazol, Plavix given possible surgery, hypotension -Continue aspirin and atorvastatin  DM - Continue aspirin atorvastatin  HTN -Hold amlodipine, clonidine, carvedilol, losartan, torsemide given hypotension  PCOS MO BMI 48  Anemia  Indeterminant pregnancy test The patient's quantitative hCG level is barely detectable.  Patient reported sexual history to me, we discussed risk of radiation to developing fetus, and she reported there is no chance that she was pregnant.  In light of that, I think this quant Hcg does not clnically signify presence of pregnancy.   Recent foot infection Just finished a course of antibiotics and fluconazole for post-op foot infection.  The feet now appear  normal        Disposition: Status is: Inpatient  Remains inpatient appropriate because:IV treatments appropriate due to intensity of illness or inability to take PO  Dispo: The patient is from: Home              Anticipated d/c is to: Home              Patient currently is not medically stable to d/c.   Difficult to place patient No              MDM: This is a no charge note.  For further details, please see H&P by my partner Dr. Dr. Jonelle Sidle from earlier today.  The below labs and imaging reports were reviewed and summarized above.    DVT prophylaxis: heparin injection 5,000 Units Start: 07/08/21 0600  Code Status: FULL Family Communication:     Consultants:    Procedures:    Antimicrobials:     Culture data:             Subjective: Feels well.  No vomiting, no abdominal pain        Objective: Vitals:   07/08/21 0530 07/08/21 0630 07/08/21 0900 07/08/21 1439  BP: 109/67 90/76 116/81 (!) 125/92  Pulse: (!) 101 (!) 101  98  Resp: 16 (!) '22 15 16  '$ Temp: 98.1 F (36.7 C)     TempSrc: Oral     SpO2: 98% 98% 96% 100%  Weight:      Height:       No intake or output data in the 24 hours ending 07/08/21 1700 Filed Weights   07/07/21 1923  Weight: 136 kg    Examination: The patient was seen and examined.      Data Reviewed: I have personally reviewed following labs and imaging studies:  CBC: Recent Labs  Lab 07/07/21 1931 07/08/21 0638  WBC 21.6* 21.9*  NEUTROABS 16.8*  --   HGB 12.2 11.4*  HCT 38.5 34.6*  MCV 95.3 96.4  PLT 213 [id number]   Basic Metabolic Panel: Recent Labs  Lab 07/07/21 1931 07/08/21 0638  NA 132* 130*  K 3.6 3.3*  CL 89* 86*  CO2 23 23  GLUCOSE 139* 97  BUN 55* 63*  CREATININE 14.18* 14.62*  CALCIUM 9.6 9.4   GFR: Estimated Creatinine Clearance: 7.6 mL/min (A) (by C-G formula based on SCr of 14.62 mg/dL (H)). Liver Function Tests: Recent Labs  Lab 07/07/21 1931 07/08/21 0638  AST 14* 16   ALT 9 10  ALKPHOS 164* 137*  BILITOT 0.8 0.9  PROT 9.4* 8.6*  ALBUMIN 3.7 3.4*   Recent Labs  Lab 07/07/21 1931  LIPASE 43   No results for input(s): AMMONIA in the last 168 hours. Coagulation Profile: Recent Labs  Lab 07/08/21 0638  INR 1.2   Cardiac Enzymes: No results for input(s): CKTOTAL, CKMB, CKMBINDEX, TROPONINI in the last 168 hours. BNP (last 3 results) No results for input(s): PROBNP in the last 8760 hours. HbA1C: No results for input(s): HGBA1C in the last 72 hours. CBG: No results for input(s): GLUCAP in the last 168 hours. Lipid Profile: No results for input(s): CHOL, HDL, LDLCALC, TRIG, CHOLHDL, LDLDIRECT in the last 72 hours. Thyroid Function Tests: No results for input(s): TSH, T4TOTAL, FREET4, T3FREE, THYROIDAB in the last 72 hours. Anemia Panel: No results for input(s): VITAMINB12, FOLATE, FERRITIN, TIBC, IRON, RETICCTPCT in the last 72 hours. Urine analysis:    Component Value Date/Time   COLORURINE YELLOW (A) 02/11/2016 0200   APPEARANCEUR CLEAR (A) 02/11/2016 0200   LABSPEC 1.011 02/11/2016 0200   PHURINE 5.0 02/11/2016 0200   GLUCOSEU 50 (A) 02/11/2016 0200   HGBUR NEGATIVE 02/11/2016 0200   BILIRUBINUR NEGATIVE 02/11/2016 0200   KETONESUR NEGATIVE 02/11/2016 0200   PROTEINUR >500 (A) 02/11/2016 0200   NITRITE NEGATIVE 02/11/2016 0200   LEUKOCYTESUR NEGATIVE 02/11/2016 0200   Sepsis Labs: '@LABRCNTIP'$ (procalcitonin:4,lacticacidven:4)  ) Recent Results (from the past 240 hour(s))  Blood culture (routine x 2)     Status: None (Preliminary result)   Collection Time: 07/07/21  9:04 PM   Specimen: BLOOD  Result Value Ref Range Status   Specimen Description BLOOD RIGHT ANTECUBITAL  Final   Special Requests   Final    BOTTLES DRAWN AEROBIC AND ANAEROBIC Blood Culture results may not be optimal due to an inadequate volume of blood received in culture bottles   Culture   Final    NO GROWTH < 12 HOURS Performed at [facility], [address] [place], [place] [address]    Report Status PENDING  Incomplete  Blood culture (routine x 2)     Status: None (Preliminary result)   Collection Time: 07/07/21  9:04 PM   Specimen: BLOOD  Result Value Ref Range Status   Specimen Description BLOOD BLOOD RIGHT HAND  Final   Special Requests   Final    BOTTLES DRAWN AEROBIC AND ANAEROBIC Blood Culture adequate volume   Culture   Final    NO GROWTH < 12 HOURS Performed at [facility], [address] [place], [place] [address]    Report Status PENDING  Incomplete  Resp Panel by RT-PCR (Flu A&B, Covid) Nasopharyngeal Swab     Status: None   Collection Time: 07/07/21  9:06 PM   Specimen: Nasopharyngeal Swab; Nasopharyngeal(NP) swabs in vial transport medium  Result Value Ref Range Status   SARS Coronavirus 2 by RT PCR NEGATIVE NEGATIVE Final    Comment: (NOTE) SARS-CoV-2 target nucleic acids are NOT DETECTED.  The SARS-CoV-2 RNA is generally detectable in upper respiratory specimens during the acute phase of infection. The lowest concentration of SARS-CoV-2 viral copies this assay can detect is 138 copies/mL. A negative result does not preclude SARS-Cov-2 infection and should not be used as the sole basis for treatment or other patient management decisions. A negative result may occur with  improper specimen collection/handling, submission of specimen other than nasopharyngeal swab, presence of viral mutation(s) within the areas targeted by this assay, and inadequate number of viral copies(<138 copies/mL). A negative result must be combined with clinical observations, patient history, and epidemiological information. The expected result is Negative.  Fact Sheet for Patients:  EntrepreneurPulse.com.au  Fact Sheet for Healthcare Providers:  IncredibleEmployment.be  This test is no t yet approved or cleared by the [place] FDA and  has been authorized for detection and/or  diagnosis of SARS-CoV-2 by FDA under an Emergency Use Authorization (EUA). This EUA will remain  in effect (meaning this test can be used) for the duration of the COVID-19 declaration under Section 564(b)(1) of the Act, 21 U.S.C.section 360bbb-3(b)(1), unless the authorization is terminated  or revoked sooner.       Influenza A by PCR NEGATIVE NEGATIVE Final   Influenza B by PCR NEGATIVE NEGATIVE Final    Comment: (NOTE) The Xpert Xpress SARS-CoV-2/FLU/RSV plus assay is intended as an aid in the diagnosis of influenza from Nasopharyngeal swab specimens and should not be used as a sole basis for treatment. Nasal washings and aspirates are unacceptable for Xpert Xpress SARS-CoV-2/FLU/RSV testing.  Fact Sheet for Patients: EntrepreneurPulse.com.au  Fact Sheet for Healthcare Providers: IncredibleEmployment.be  This test is not yet approved or cleared by the [place] FDA and has been authorized for detection and/or diagnosis of SARS-CoV-2 by FDA under an Emergency Use Authorization (EUA). This EUA will remain in effect (meaning this test can be used) for the duration of the COVID-19 declaration under Section 564(b)(1) of the Act, 21 U.S.C. section 360bbb-3(b)(1), unless the authorization is terminated or revoked.  Performed at [facility], [address] [place], [place] [address]          Radiology Studies: DG Chest 1 View  Result Date: 07/07/2021 CLINICAL DATA:  Hypotension. EXAM: CHEST  1 VIEW COMPARISON:  Chest radiograph dated 07/02/2019. FINDINGS: Cardiomegaly with vascular congestion. No focal consolidation, pleural effusion, or pneumothorax. Evaluation however is limited as the left lung base and left costophrenic angle has been excluded from the image. No acute osseous pathology. IMPRESSION: Cardiomegaly with mild vascular congestion.  No focal consolidation. Electronically Signed   By: Anner Crete M.D.   On:  07/07/2021 20:10   CT ABDOMEN PELVIS W CONTRAST  Result Date: 07/07/2021 CLINICAL DATA:  Abdominal pain and fever. EXAM: CT ABDOMEN AND PELVIS WITH CONTRAST TECHNIQUE: Multidetector CT imaging of the abdomen and pelvis was performed using the standard protocol following bolus administration of intravenous contrast. CONTRAST:  139m OMNIPAQUE IOHEXOL 350 MG/ML SOLN COMPARISON:  None. FINDINGS: Lower chest: No pleural effusion or focal airspace disease. Mitral annulus calcifications. Coronary artery calcifications, age advanced. Hepatobiliary: Enlarged liver spanning 22.5 cm cranial caudal  with steatosis. No focal hepatic lesion. Hepatic contours are slightly nodular. Distended gallbladder with wall thickening up to 7 mm. Cholesterol as well as calcified gallstones in the gallbladder. No biliary dilatation or visualized choledocholithiasis. Pancreas: No ductal dilatation or inflammation. Spleen: Normal in size without focal abnormality. 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 No left adrenal nodule. Marked bilateral renal atrophy consistent with chronic renal disease. No hydronephrosis. Urinary bladder completely empty. Stomach/Bowel: No bowel obstruction or inflammation. Moderate colonic stool burden and tortuosity. Appendix tentatively visualized and normal. Majority of small bowel is decompressed. Fluid or ingested contents within the stomach. Vascular/Lymphatic: Advanced aortic and branch atherosclerosis, particularly for age. No aortic aneurysm. No acute vascular findings. Multiple small retroperitoneal lymph nodes, likely reactive. Prominent sized bilateral inguinal nodes have normal fatty hila. No suspicious abdominopelvic adenopathy. Reproductive: Other than uterine vascular calcifications, unremarkable CT appearance of the uterus. No adnexal mass. Other: Peritoneal dialysis catheter in place, tip in the right pelvis. Moderate volume of free fluid  throughout the abdomen and pelvis. No loculated fluid collection. Amorphous soft tissue calcifications in the anterior abdomen just beneath the abdominal wall musculature may be dystrophic or sequela of fat necrosis. No free intra-abdominal air. Scattered body wall edema. Musculoskeletal: Sequela of renal osteodystrophy. No acute osseous abnormalities. IMPRESSION: 1. Distended gallbladder with wall thickening up to 7 mm. This could represent acute cholecystitis in the appropriate clinical setting. Cholesterol as well as calcified gallstones in the gallbladder. No biliary dilatation or visualized choledocholithiasis. 2. Hepatomegaly and hepatic steatosis. Hepatic contours are slightly nodular, raising concern for cirrhosis. 3. Peritoneal dialysis catheter in place, tip in the right pelvis. Moderate volume of free fluid throughout the abdomen and pelvis. 4. Marked bilateral renal atrophy consistent with chronic renal disease. Aortic Atherosclerosis (ICD10-I70.0). Electronically Signed   By: Keith Rake M.D.   On: 07/07/2021 21:52   US Abdomen Limited RUQ (LIVER/GB)  Result Date: 07/07/2021 CLINICAL DATA:  Right upper quadrant abdominal pain. EXAM: ULTRASOUND ABDOMEN LIMITED RIGHT UPPER QUADRANT COMPARISON:  CT abdomen pelvis dated 07/07/2021. FINDINGS: Gallbladder: The gallbladder is filled with stones. The gallbladder wall is minimally thickened measuring 4 mm. Negative sonographic Murphy's sign. Common bile duct: Diameter: 3 mm Liver: There is mild coarsened echotexture and surface irregularity concerning for changes of cirrhosis. Portal vein is patent on color Doppler imaging with normal direction of blood flow towards the liver. Other: Small ascites. IMPRESSION: 1. Stone filled gallbladder without definite sonographic evidence of acute cholecystitis. A hepatobiliary scintigraphy may provide better evaluation of the gallbladder if there is a high clinical concern for acute cholecystitis . 2. Possible early  changes of cirrhosis. 3. Small ascites. Electronically Signed   By: Anner Crete M.D.   On: 07/07/2021 23:28        Scheduled Meds:  aspirin EC  81 mg Oral Daily   atorvastatin  20 mg Oral Daily   calcium acetate  1,334 mg Oral TID WC   gentamicin cream  1 application Topical Daily   heparin  5,000 Units Subcutaneous Q8H   Continuous Infusions:  cefTRIAXone (ROCEPHIN)  IV     dialysis solution 1.5% low-MG/low-CA     metronidazole Stopped (07/08/21 [id number])     LOS: 0 days    Time spent: 25 minutes    Edwin Dada, MD [facility] 07/08/2021, 5:00 PM     Please page though [facility] or Epic secure chat:  For password, contact charge nurse

## 2021-07-08 NOTE — Progress Notes (Signed)
Pharmacy Antibiotic Note  [name] is a 35 y.o. female admitted on 07/07/2021 with  intra-abdominal infection .  Pharmacy has been consulted for Cefepime dosing x 7 days.  Pt w/ ESRD: "on peritoneal dialysis.  Nephrology consult for dialysis inpatient."  Plan: Pt initially ordered Cefepime 2 gm once by pharmacy given at [place] after consult initially place.  Upon returning to pt profile to complete consult, was observed that pt had been ordered and received Cefepime 2 gm once at 0514 for total dose of Cefepime 4 gm over 4 hour period.  Discontinuing scheduled dosing pending nephrology consult.  Height: '5\' 6"'$  (167.6 cm) Weight: 136 kg (299 lb 13.2 oz) IBW/kg (Calculated) : 59.3  Temp (24hrs), Avg:97.9 F (36.6 C), Min:97.6 F (36.4 C), Max:98.1 F (36.7 C)  Recent Labs  Lab 07/07/21 1931 07/07/21 2104 07/07/21 2327  WBC 21.6*  --   --   CREATININE 14.18*  --   --   LATICACIDVEN  --  3.0* 3.1*    Estimated Creatinine Clearance: 7.8 mL/min (A) (by C-G formula based on SCr of 14.18 mg/dL (H)).    Allergies  Allergen Reactions   Tomato Itching   Tomato (Diagnostic) Itching   Gabapentin Other (See Comments)    Extremity tremors    Antimicrobials this admission: 9/21 Zosyn >> x 1 9/22 Cefepime >> x 7 days 9/22 Flagyl >> x 7 days  Microbiology results: 9/22 BCx: Pending  Thank you for allowing pharmacy to be a part of this patient's care.  Renda Rolls, PharmD, [facility] 07/08/2021 7:30 AM

## 2021-07-08 NOTE — Progress Notes (Signed)
Pharmacy Antibiotic Note  [name] is a 35 y.o. female admitted on 07/07/2021 with  intra-abdominal infection .  Pharmacy has been consulted for Cefepime dosing x 7 days.   Peritoneal dialysis, patient will need to transfer to room in oder to have peritoneal dialysis.  Patient got Cefepime 2gm 9/22 at [place] and Cefepime 2gm 9/22 at 0514.  Plan: Cefepime 1gm daily once peritoneal dialysis resumed. Will hold on ordering any additional cefepime at this time due to 4gm loading dose received today.  Height: '5\' 6"'$  (167.6 cm) Weight: 136 kg (299 lb 13.2 oz) IBW/kg (Calculated) : 59.3  Temp (24hrs), Avg:97.9 F (36.6 C), Min:97.6 F (36.4 C), Max:98.1 F (36.7 C)  Recent Labs  Lab 07/07/21 1931 07/07/21 2104 07/07/21 2327 07/08/21 0638 07/08/21 1016  WBC 21.6*  --   --  21.9*  --   CREATININE 14.18*  --   --  14.62*  --   LATICACIDVEN  --  3.0* 3.1*  --  2.9*    Estimated Creatinine Clearance: 7.6 mL/min (A) (by C-G formula based on SCr of 14.62 mg/dL (H)).    Allergies  Allergen Reactions   Tomato Itching   Tomato (Diagnostic) Itching   Gabapentin Other (See Comments)    Extremity tremors    Antimicrobials this admission: 9/21 Zosyn >> x 1 9/22 Cefepime >> x 7 days 9/22 Flagyl >> x 7 days   Microbiology results: 9/21 Blood Cx: pending 9/22 peritoneal fluid Cx: pending  Thank you for allowing pharmacy to be a part of this patient's care.  Shatoya Roets Rodriguez-Guzman PharmD, BCPS 07/08/2021 3:10 PM

## 2021-07-08 NOTE — Sepsis Progress Note (Signed)
Sepsis protocol followed by eLink 

## 2021-07-08 NOTE — Progress Notes (Signed)
Peritoneal effluent sample drawn from pd catheter. Patient had last fill still in from yesterday, per patient. Effluent clear/yellow, no fibrin noted. Patient tolerated well.

## 2021-07-08 NOTE — Progress Notes (Signed)
  Subjective:  Patient ID: [name], female    DOB: June 16, 1986,  MRN: [id number]  No chief complaint on file.     35 y.o. female returns for post-op check.  She has been trying to rest and stay off it is much as possible the drainage is reducing  Review of Systems: Negative except as noted in the HPI. Denies N/V/F/Ch.   Objective:  There were no vitals filed for this visit. There is no height or weight on file to calculate BMI. Constitutional Well developed. Well nourished.  Vascular Unchanged vascular status  Neurologic Normal speech. Oriented to person, place, and time. Epicritic sensation to light touch grossly present bilaterally.  Dermatologic Incision on the right side is well-healed with sutures intact, the left side incision has multiple areas of deep and superficial dehiscence  Orthopedic: Tenderness to palpation noted about the surgical site.    Assessment:   1. Gangrene of toe of right foot ([place])   2. Gangrene of toe of left foot ([place])   3. Peripheral arterial disease ([place])     Plan:  Patient was evaluated and treated and all questions answered.  S/p foot surgery bilaterally -Sutures removed on the right side uneventfully she can resume regular shoe gear bathing and application of lotion on the side. -Unfortunate her left side appears to be suffering from dehiscence.  I think her healing capacity with her small vessel disease which is severe is quite low in this area.  We will have to continue to see how this improves if it responds to local wound care debridement.  She may be facing an eventual transmetatarsal amputation but we hope to avoid this if possible  Return in about 2 weeks (around 07/21/2021) for suture removal, post op (no x-rays).

## 2021-07-08 NOTE — Progress Notes (Signed)
[facility], [place] 07/08/21  Subjective:   LOS: 0 Patient known to our practice from outpatient dialysis follow-up.  She presents to the ER for several days of nausea and vomiting.  She reports constipation and feels dehydrated.  Not able to keep food or medications down.  In the ER she was found to have elevated lactic acidosis.  CT of the abdomen and pelvis with contrast showed gallbladder stones and thickened gallbladder wall.  Patient is getting broad-spectrum IV antibiotics. This morning, overall she feels better.  About to eat breakfast this morning when seen. Patient states that she has not been doing her PD for the last couple of days because she was feeling sick.  Objective:  Vital signs in last 24 hours:  Temp:  [97.6 F (36.4 C)-98.1 F (36.7 C)] 98.1 F (36.7 C) (09/22 0530) Pulse Rate:  [41-115] 101 (09/22 0630) Resp:  [11-23] 22 (09/22 0630) BP: (78-130)/(49-90) 90/76 (09/22 0630) SpO2:  [74 %-100 %] 98 % (09/22 0630) Weight:  NI:[id number] kg] 136 kg (09/21 1923)  Weight change:  Filed Weights   07/07/21 1923  Weight: 136 kg    Intake/Output:   No intake or output data in the 24 hours ending 07/08/21 0940   Physical Exam: General: Well-appearing, sitting up in the bed  HEENT Anicteric, moist oral mucous membranes  Pulm/lungs Normal breathing effort on room air, clear to auscultation  CVS/Heart Regular, no rub  Abdomen:  Soft, nontender  Extremities: Trace edema  Neurologic: Alert, oriented  Skin: Warm, dry  Access: PD catheter in place       Basic Metabolic Panel:  Recent Labs  Lab 07/07/21 1931 07/08/21 0638  NA 132* 130*  K 3.6 3.3*  CL 89* 86*  CO2 23 23  GLUCOSE 139* 97  BUN 55* 63*  CREATININE 14.18* 14.62*  CALCIUM 9.6 9.4     CBC: Recent Labs  Lab 07/07/21 1931 07/08/21 0638  WBC 21.6* 21.9*  NEUTROABS 16.8*  --   HGB 12.2 11.4*  HCT 38.5 34.6*  MCV 95.3 96.4  PLT 213 173      Lab Results   Component Value Date   HEPBSAG Negative 02/11/2016   HEPBSAB Non Reactive 02/11/2016      Microbiology:  Recent Results (from the past 240 hour(s))  Blood culture (routine x 2)     Status: None (Preliminary result)   Collection Time: 07/07/21  9:04 PM   Specimen: BLOOD  Result Value Ref Range Status   Specimen Description BLOOD RIGHT ANTECUBITAL  Final   Special Requests   Final    BOTTLES DRAWN AEROBIC AND ANAEROBIC Blood Culture results may not be optimal due to an inadequate volume of blood received in culture bottles   Culture   Final    NO GROWTH < 12 HOURS Performed at [facility], [address] [place], [place] [address]    Report Status PENDING  Incomplete  Blood culture (routine x 2)     Status: None (Preliminary result)   Collection Time: 07/07/21  9:04 PM   Specimen: BLOOD  Result Value Ref Range Status   Specimen Description BLOOD BLOOD RIGHT HAND  Final   Special Requests   Final    BOTTLES DRAWN AEROBIC AND ANAEROBIC Blood Culture adequate volume   Culture   Final    NO GROWTH < 12 HOURS Performed at [facility], [address], [place], [place] [address]    Report Status PENDING  Incomplete  Resp Panel by RT-PCR (Flu A&B, Covid) Nasopharyngeal Swab     Status: None   Collection Time: 07/07/21  9:06 PM   Specimen: Nasopharyngeal Swab; Nasopharyngeal(NP) swabs in vial transport medium  Result Value Ref Range Status   SARS Coronavirus 2 by RT PCR NEGATIVE NEGATIVE Final    Comment: (NOTE) SARS-CoV-2 target nucleic acids are NOT DETECTED.  The SARS-CoV-2 RNA is generally detectable in upper respiratory specimens during the acute phase of infection. The lowest concentration of SARS-CoV-2 viral copies this assay can detect is 138 copies/mL. A negative result does not preclude SARS-Cov-2 infection and should not be used as the sole basis for treatment or other patient management decisions. A negative result may occur with  improper  specimen collection/handling, submission of specimen other than nasopharyngeal swab, presence of viral mutation(s) within the areas targeted by this assay, and inadequate number of viral copies(<138 copies/mL). A negative result must be combined with clinical observations, patient history, and epidemiological information. The expected result is Negative.  Fact Sheet for Patients:  EntrepreneurPulse.com.au  Fact Sheet for Healthcare Providers:  IncredibleEmployment.be  This test is no t yet approved or cleared by the [place] FDA and  has been authorized for detection and/or diagnosis of SARS-CoV-2 by FDA under an Emergency Use Authorization (EUA). This EUA will remain  in effect (meaning this test can be used) for the duration of the COVID-19 declaration under Section 564(b)(1) of the Act, 21 U.S.C.section 360bbb-3(b)(1), unless the authorization is terminated  or revoked sooner.       Influenza A by PCR NEGATIVE NEGATIVE Final   Influenza B by PCR NEGATIVE NEGATIVE Final    Comment: (NOTE) The Xpert Xpress SARS-CoV-2/FLU/RSV plus assay is intended as an aid in the diagnosis of influenza from Nasopharyngeal swab specimens and should not be used as a sole basis for treatment. Nasal washings and aspirates are unacceptable for Xpert Xpress SARS-CoV-2/FLU/RSV testing.  Fact Sheet for Patients: EntrepreneurPulse.com.au  Fact Sheet for Healthcare Providers: IncredibleEmployment.be  This test is not yet approved or cleared by the [place] FDA and has been authorized for detection and/or diagnosis of SARS-CoV-2 by FDA under an Emergency Use Authorization (EUA). This EUA will remain in effect (meaning this test can be used) for the duration of the COVID-19 declaration under Section 564(b)(1) of the Act, 21 U.S.C. section 360bbb-3(b)(1), unless the authorization is terminated or revoked.  Performed at  [facility], [address] [place], [place] [address]     Coagulation Studies: Recent Labs    07/08/21 0638  LABPROT 15.2  INR 1.2    Urinalysis: No results for input(s): COLORURINE, LABSPEC, PHURINE, GLUCOSEU, HGBUR, BILIRUBINUR, KETONESUR, PROTEINUR, UROBILINOGEN, NITRITE, LEUKOCYTESUR in the last 72 hours.  Invalid input(s): APPERANCEUR    Imaging: DG Chest 1 View  Result Date: 07/07/2021 CLINICAL DATA:  Hypotension. EXAM: CHEST  1 VIEW COMPARISON:  Chest radiograph dated 07/02/2019. FINDINGS: Cardiomegaly with vascular congestion. No focal consolidation, pleural effusion, or pneumothorax. Evaluation however is limited as the left lung base and left costophrenic angle has been excluded from the image. No acute osseous pathology. IMPRESSION: Cardiomegaly with mild vascular congestion.  No focal consolidation. Electronically Signed   By: Anner Crete M.D.   On: 07/07/2021 20:10   CT ABDOMEN PELVIS W CONTRAST  Result Date: 07/07/2021 CLINICAL DATA:  Abdominal pain and fever. EXAM: CT ABDOMEN AND PELVIS WITH CONTRAST TECHNIQUE: Multidetector CT imaging of the abdomen and pelvis was performed using the standard protocol following bolus administration  of intravenous contrast. CONTRAST:  161m OMNIPAQUE IOHEXOL 350 MG/ML SOLN COMPARISON:  None. FINDINGS: Lower chest: No pleural effusion or focal airspace disease. Mitral annulus calcifications. Coronary artery calcifications, age advanced. Hepatobiliary: Enlarged liver spanning 22.5 cm cranial caudal with steatosis. No focal hepatic lesion. Hepatic contours are slightly nodular. Distended gallbladder with wall thickening up to 7 mm. Cholesterol as well as calcified gallstones in the gallbladder. No biliary dilatation or visualized choledocholithiasis. Pancreas: No ductal dilatation or inflammation. Spleen: Normal in size without focal abnormality. 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 No left adrenal nodule. Marked bilateral renal atrophy consistent with chronic renal disease. No hydronephrosis. Urinary bladder completely empty. Stomach/Bowel: No bowel obstruction or inflammation. Moderate colonic stool burden and tortuosity. Appendix tentatively visualized and normal. Majority of small bowel is decompressed. Fluid or ingested contents within the stomach. Vascular/Lymphatic: Advanced aortic and branch atherosclerosis, particularly for age. No aortic aneurysm. No acute vascular findings. Multiple small retroperitoneal lymph nodes, likely reactive. Prominent sized bilateral inguinal nodes have normal fatty hila. No suspicious abdominopelvic adenopathy. Reproductive: Other than uterine vascular calcifications, unremarkable CT appearance of the uterus. No adnexal mass. Other: Peritoneal dialysis catheter in place, tip in the right pelvis. Moderate volume of free fluid throughout the abdomen and pelvis. No loculated fluid collection. Amorphous soft tissue calcifications in the anterior abdomen just beneath the abdominal wall musculature may be dystrophic or sequela of fat necrosis. No free intra-abdominal air. Scattered body wall edema. Musculoskeletal: Sequela of renal osteodystrophy. No acute osseous abnormalities. IMPRESSION: 1. Distended gallbladder with wall thickening up to 7 mm. This could represent acute cholecystitis in the appropriate clinical setting. Cholesterol as well as calcified gallstones in the gallbladder. No biliary dilatation or visualized choledocholithiasis. 2. Hepatomegaly and hepatic steatosis. Hepatic contours are slightly nodular, raising concern for cirrhosis. 3. Peritoneal dialysis catheter in place, tip in the right pelvis. Moderate volume of free fluid throughout the abdomen and pelvis. 4. Marked bilateral renal atrophy consistent with chronic renal disease. Aortic Atherosclerosis (ICD10-I70.0). Electronically Signed   By: MKeith RakeM.D.   On:  07/07/2021 21:52   [place]domen Limited RUQ (LIVER/GB)  Result Date: 07/07/2021 CLINICAL DATA:  Right upper quadrant abdominal pain. EXAM: ULTRASOUND ABDOMEN LIMITED RIGHT UPPER QUADRANT COMPARISON:  CT abdomen pelvis dated 07/07/2021. FINDINGS: Gallbladder: The gallbladder is filled with stones. The gallbladder wall is minimally thickened measuring 4 mm. Negative sonographic Murphy's sign. Common bile duct: Diameter: 3 mm Liver: There is mild coarsened echotexture and surface irregularity concerning for changes of cirrhosis. Portal vein is patent on color Doppler imaging with normal direction of blood flow towards the liver. Other: Small ascites. IMPRESSION: 1. Stone filled gallbladder without definite sonographic evidence of acute cholecystitis. A hepatobiliary scintigraphy may provide better evaluation of the gallbladder if there is a high clinical concern for acute cholecystitis . 2. Possible early changes of cirrhosis. 3. Small ascites. Electronically Signed   By: AAnner CreteM.D.   On: 07/07/2021 23:28     Medications:    metronidazole Stopped (07/08/21 [id number]    aspirin EC  81 mg Oral Daily   atorvastatin  20 mg Oral Daily   calcium acetate  1,334 mg Oral TID WC   heparin  5,000 Units Subcutaneous Q8H   ondansetron **OR** ondansetron (ZOFRAN) IV  Assessment/ Plan:  35y.o. female with end-stage renal disease on peritoneal dialysis, calciphylaxis, morbid obesity, type 2 diabetes, secondary hyperparathyroidism, peripheral vascular disease, with history of toe amputations.  Ligation of left brachiocephalic AV fistula in August 2022 for steal syndrome, left hand ulcer, was admitted on 07/07/2021 for  Principal Problem:   Severe sepsis ([place]) Active Problems:   Anemia in chronic kidney disease   HTN (hypertension)   ESRD (end stage renal disease) ([facility])   Chronic diastolic CHF (congestive heart failure) ([place])   Essential hypertension   Cholecystitis, unspecified  Sepsis ([place])  [A41.9]  #. ESRD Will obtain PD fluid cultures and cell counts to rule out peritonitis  CCPD ordered.  Patient may need to be transferred to a hospital room in order to perform PD.   #. Anemia of CKD  Lab Results  Component Value Date   HGB 11.4 (L) 07/08/2021   EPO dose as outpatient.  Hemoglobin acceptable.  #. Secondary hyperparathyroidism of renal origin N 25.81      Component Value Date/Time   PTH 217 (H) 02/11/2016 1317   Lab Results  Component Value Date   PHOS 9.9 (H) 09/17/2019   Monitor calcium and phos level during this admission Phosphorus elevated.  Continue low phosphorus diet and calcium acetate Phos binders.  #. Diabetes type 2 with CKD Hgb A1c MFr Bld (%)  Date Value  07/04/2019 5.1    #Nausea and vomiting With lactic acidosis CT of the abdomen suspicious for acute cholecystitis Overall patient is clinically improved   LOS: 0 [name] 9/22/20229:40 AM  [place], [place]

## 2021-07-09 ENCOUNTER — Ambulatory Visit (INDEPENDENT_AMBULATORY_CARE_PROVIDER_SITE_OTHER): Payer: BLUE CROSS/BLUE SHIELD | Admitting: Nurse Practitioner

## 2021-07-09 ENCOUNTER — Inpatient Hospital Stay: Payer: Medicare Other

## 2021-07-09 ENCOUNTER — Encounter: Payer: Self-pay | Admitting: Internal Medicine

## 2021-07-09 ENCOUNTER — Encounter (INDEPENDENT_AMBULATORY_CARE_PROVIDER_SITE_OTHER): Payer: Medicare Other

## 2021-07-09 DIAGNOSIS — N186 End stage renal disease: Secondary | ICD-10-CM | POA: Diagnosis not present

## 2021-07-09 DIAGNOSIS — I10 Essential (primary) hypertension: Secondary | ICD-10-CM | POA: Diagnosis not present

## 2021-07-09 DIAGNOSIS — I5032 Chronic diastolic (congestive) heart failure: Secondary | ICD-10-CM

## 2021-07-09 DIAGNOSIS — K819 Cholecystitis, unspecified: Secondary | ICD-10-CM | POA: Diagnosis not present

## 2021-07-09 LAB — CBC
HCT: 33.3 % — ABNORMAL LOW (ref 36.0–46.0)
Hemoglobin: 11.2 g/dL — ABNORMAL LOW (ref 12.0–15.0)
MCH: 32.1 pg (ref 26.0–34.0)
MCHC: 33.6 g/dL (ref 30.0–36.0)
MCV: 95.4 fL (ref 80.0–100.0)
Platelets: 163 10*3/uL (ref 150–400)
RBC: 3.49 MIL/uL — ABNORMAL LOW (ref 3.87–5.11)
RDW: 14.7 % (ref 11.5–15.5)
WBC: 18 10*3/uL — ABNORMAL HIGH (ref 4.0–10.5)
nRBC: 0 % (ref 0.0–0.2)

## 2021-07-09 LAB — BASIC METABOLIC PANEL
Anion gap: 19 — ABNORMAL HIGH (ref 5–15)
BUN: 61 mg/dL — ABNORMAL HIGH (ref 6–20)
CO2: 22 mmol/L (ref 22–32)
Calcium: 8.9 mg/dL (ref 8.9–10.3)
Chloride: 89 mmol/L — ABNORMAL LOW (ref 98–111)
Creatinine, Ser: 14.15 mg/dL — ABNORMAL HIGH (ref 0.44–1.00)
GFR, Estimated: 3 mL/min — ABNORMAL LOW (ref >60)
Glucose, Bld: 90 mg/dL (ref 70–99)
Potassium: 3 mmol/L — ABNORMAL LOW (ref 3.5–5.1)
Sodium: 130 mmol/L — ABNORMAL LOW (ref 135–145)

## 2021-07-09 LAB — HEPATITIS B SURFACE ANTIBODY, QUANTITATIVE: Hep B S AB Quant (Post): 76.9 m[IU]/mL (ref >9.9)

## 2021-07-09 IMAGING — NM NM HEPATO W/GB/PHARM/[PERSON_NAME]
2 series · 12 of 12 positions shown · non-contrast
Comparison: CT [DATE], ultrasound [DATE]

CLINICAL DATA: Sepsis. Multiple gallstones. Gallbladder wall
thickening. Concern for acute cholecystitis.
TECHNIQUE: Sequential images of the abdomen were obtained [DATE] minutes
following intravenous administration of radiopharmaceutical. After
oral ingestion of Ensure, gallbladder ejection fraction was
determined. At 60 min, normal ejection fraction is greater than 33%.

RADIOPHARMACEUTICALS:  5.3 mCi [WW]  Choletec IV

[Series 1000: gallbladder ef · 4.80mm/px · 6 of 120 frames shown]
[frame 11/120]
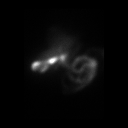
[frame 31/120]
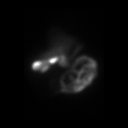
[frame 51/120]
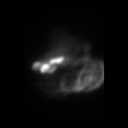
[frame 71/120]
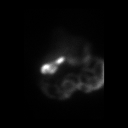
[frame 91/120]
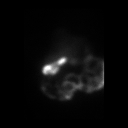
[frame 111/120]
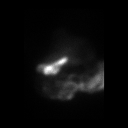

[Series 1000: hepatobiliary scan · 9.59mm/px · 6 of 60 frames shown]
[frame 6/60]
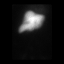
[frame 16/60]
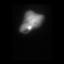
[frame 26/60]
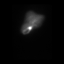
[frame 36/60]
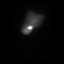
[frame 46/60]
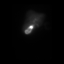
[frame 56/60]
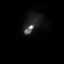

[12 of 12 positions shown; findings below may reference images not displayed]

Of note patient had a borderline positive quantitative beta HCG.
This is considered a spurious finding not related to pregnancy.
Patient denied chance of pregnancy. Risks and benefits of procedure
described to patient by hospitalist attending.

EXAM:
NUCLEAR MEDICINE HEPATOBILIARY IMAGING WITH GALLBLADDER EF
FINDINGS: Prompt clearance of radiotracer from the small bowel and homogeneous
uptake in liver. Gallbladder begins to fill at 15 minutes and
continues to fill up to 60 minutes. Filling defect within the
gallbladder related to gallstones. Small amount of counts are noted
in the small bowel.

Fatty meal was administered PO. Minimal but incomplete contraction
of the gallbladder following fatty meal with ejection fraction
calculated at 23%.

Calculated gallbladder ejection fraction is 23%. (Normal gallbladder
ejection fraction with Ensure is greater than 33%.)
IMPRESSION: 1. Patent cystic duct and common bile duct. Findings inconsistent
acute cholecystitis.
2. Multiple gallstones noted.
3. Low ejection fraction. Findings favor incomplete decompression of
gallbladder related to multiple stones within the gallbladder versus
chronic cholecystitis or a combination of both.

## 2021-07-09 MED ORDER — ONDANSETRON HCL 4 MG PO TABS
4.0000 mg | ORAL_TABLET | Freq: Four times a day (QID) | ORAL | 0 refills | Status: DC | PRN
Start: 2021-07-09 — End: 2021-09-08

## 2021-07-09 MED ORDER — LACTULOSE 10 GM/15ML PO SOLN: 20.0000 g | Freq: Two times a day (BID) | ORAL | Status: DC

## 2021-07-09 MED ORDER — TECHNETIUM TC 99M MEBROFENIN IV KIT
5.0000 | PACK | Freq: Once | INTRAVENOUS | Status: AC | PRN
  Administered 2021-07-09: 5.38 via INTRAVENOUS

## 2021-07-09 MED ORDER — LACTULOSE 10 GM/15ML PO SOLN: 20.0000 g | Freq: Two times a day (BID) | ORAL | 3 refills | Status: AC | PRN

## 2021-07-09 NOTE — Plan of Care (Signed)
Continuing with plan of care. 

## 2021-07-09 NOTE — Plan of Care (Signed)
Discharge teaching completed with patient who is in stable condition. 

## 2021-07-09 NOTE — Progress Notes (Signed)
[facility], [place] 07/09/21  Subjective:   LOS: 1 Patient known to our practice from outpatient dialysis follow-up.  She presents to the ER for several days of nausea and vomiting.  She reports constipation and feels dehydrated.  Not able to keep food or medications down.  In the ER she was found to have elevated lactic acidosis.  CT of the abdomen and pelvis with contrast showed gallbladder stones and thickened gallbladder wall.  Patient is getting broad-spectrum IV antibiotics.  Patient feels okay today.  Has had her HIDA scan therefore has not had breakfast or lunch but feels hungry and feels like she should be able to eat some. No shortness of breath Tolerated PD okay PD fluid is clear.  No evidence of peritonitis.  Objective:  Vital signs in last 24 hours:  Temp:  [97.9 F (36.6 C)-98.7 F (37.1 C)] 98.3 F (36.8 C) (09/23 0849) Pulse Rate:  [93-96] 93 (09/23 0849) Resp:  [16-18] 18 (09/23 0849) BP: (92-128)/(65-87) 128/78 (09/23 0849) SpO2:  [100 %] 100 % (09/23 0849) Weight:  [128.4 kg] 128.4 kg (09/22 2130)  Weight change: -7.6 kg Filed Weights   07/07/21 1923 07/08/21 2130  Weight: 136 kg 128.4 kg    Intake/Output:    Intake/Output Summary (Last 24 hours) at 07/09/2021 1526 Last data filed at 07/09/2021 [id number] Gross per 24 hour  Intake 300 ml  Output --  Net 300 ml     Physical Exam: General: Well-appearing, sitting up in the bed  HEENT Anicteric, moist oral mucous membranes  Pulm/lungs Normal breathing effort on room air, clear to auscultation  CVS/Heart Regular, no rub  Abdomen:  Soft, nontender  Extremities: Trace edema  Neurologic: Alert, oriented  Skin: Warm, dry  Access: PD catheter in place       Basic Metabolic Panel:  Recent Labs  Lab 07/07/21 1931 07/08/21 0638 07/09/21 0537  NA 132* 130* 130*  K 3.6 3.3* 3.0*  CL 89* 86* 89*  CO2 '23 23 22  '$ GLUCOSE 139* 97 90  BUN 55* 63* 61*  CREATININE 14.18* 14.62*  14.15*  CALCIUM 9.6 9.4 8.9      CBC: Recent Labs  Lab 07/07/21 1931 07/08/21 0638 07/09/21 0537  WBC 21.6* 21.9* 18.0*  NEUTROABS 16.8*  --   --   HGB 12.2 11.4* 11.2*  HCT 38.5 34.6* 33.3*  MCV 95.3 96.4 95.4  PLT 213 173 163       Lab Results  Component Value Date   HEPBSAG NON REACTIVE 07/08/2021   HEPBSAB Non Reactive 02/11/2016      Microbiology:  Recent Results (from the past 240 hour(s))  Blood culture (routine x 2)     Status: None (Preliminary result)   Collection Time: 07/07/21  9:04 PM   Specimen: BLOOD  Result Value Ref Range Status   Specimen Description BLOOD RIGHT ANTECUBITAL  Final   Special Requests   Final    BOTTLES DRAWN AEROBIC AND ANAEROBIC Blood Culture results may not be optimal due to an inadequate volume of blood received in culture bottles   Culture   Final    NO GROWTH 2 DAYS Performed at [facility], [address] [place], [place] [address]    Report Status PENDING  Incomplete  Blood culture (routine x 2)     Status: None (Preliminary result)   Collection Time: 07/07/21  9:04 PM   Specimen: BLOOD  Result Value Ref Range Status   Specimen Description BLOOD BLOOD  RIGHT HAND  Final   Special Requests   Final    BOTTLES DRAWN AEROBIC AND ANAEROBIC Blood Culture adequate volume   Culture   Final    NO GROWTH 2 DAYS Performed at [facility], [address] [place], [place] [address]    Report Status PENDING  Incomplete  Resp Panel by RT-PCR (Flu A&B, Covid) Nasopharyngeal Swab     Status: None   Collection Time: 07/07/21  9:06 PM   Specimen: Nasopharyngeal Swab; Nasopharyngeal(NP) swabs in vial transport medium  Result Value Ref Range Status   SARS Coronavirus 2 by RT PCR NEGATIVE NEGATIVE Final    Comment: (NOTE) SARS-CoV-2 target nucleic acids are NOT DETECTED.  The SARS-CoV-2 RNA is generally detectable in upper respiratory specimens during the acute phase of infection. The lowest concentration of  SARS-CoV-2 viral copies this assay can detect is 138 copies/mL. A negative result does not preclude SARS-Cov-2 infection and should not be used as the sole basis for treatment or other patient management decisions. A negative result may occur with  improper specimen collection/handling, submission of specimen other than nasopharyngeal swab, presence of viral mutation(s) within the areas targeted by this assay, and inadequate number of viral copies(<138 copies/mL). A negative result must be combined with clinical observations, patient history, and epidemiological information. The expected result is Negative.  Fact Sheet for Patients:  EntrepreneurPulse.com.au  Fact Sheet for Healthcare Providers:  IncredibleEmployment.be  This test is no t yet approved or cleared by the [place] FDA and  has been authorized for detection and/or diagnosis of SARS-CoV-2 by FDA under an Emergency Use Authorization (EUA). This EUA will remain  in effect (meaning this test can be used) for the duration of the COVID-19 declaration under Section 564(b)(1) of the Act, 21 U.S.C.section 360bbb-3(b)(1), unless the authorization is terminated  or revoked sooner.       Influenza A by PCR NEGATIVE NEGATIVE Final   Influenza B by PCR NEGATIVE NEGATIVE Final    Comment: (NOTE) The Xpert Xpress SARS-CoV-2/FLU/RSV plus assay is intended as an aid in the diagnosis of influenza from Nasopharyngeal swab specimens and should not be used as a sole basis for treatment. Nasal washings and aspirates are unacceptable for Xpert Xpress SARS-CoV-2/FLU/RSV testing.  Fact Sheet for Patients: EntrepreneurPulse.com.au  Fact Sheet for Healthcare Providers: IncredibleEmployment.be  This test is not yet approved or cleared by the [place] FDA and has been authorized for detection and/or diagnosis of SARS-CoV-2 by FDA under an Emergency Use  Authorization (EUA). This EUA will remain in effect (meaning this test can be used) for the duration of the COVID-19 declaration under Section 564(b)(1) of the Act, 21 U.S.C. section 360bbb-3(b)(1), unless the authorization is terminated or revoked.  Performed at [facility], [address] [place], [place] [address]   Body fluid culture w Gram Stain     Status: None (Preliminary result)   Collection Time: 07/08/21 12:10 PM   Specimen: Peritoneal Dialysate; Body Fluid  Result Value Ref Range Status   Specimen Description   Final    PERITONEAL DIALYSATE Performed at [facility], [address] [place],  [address]    Special Requests   Final    Normal Performed at [facility], [place], [place] [address]    Gram Stain   Final    NO SQUAMOUS EPITHELIAL CELLS SEEN NO WBC SEEN NO ORGANISMS SEEN    Culture   Final    NO GROWTH <  24 HOURS Performed at [facility], [place] [address] [place], [place] [address]    Report Status PENDING  Incomplete    Coagulation Studies: Recent Labs    07/08/21 0638  LABPROT 15.2  INR 1.2     Urinalysis: No results for input(s): COLORURINE, LABSPEC, PHURINE, GLUCOSEU, HGBUR, BILIRUBINUR, KETONESUR, PROTEINUR, UROBILINOGEN, NITRITE, LEUKOCYTESUR in the last 72 hours.  Invalid input(s): APPERANCEUR    Imaging: DG Chest 1 View  Result Date: 07/07/2021 CLINICAL DATA:  Hypotension. EXAM: CHEST  1 VIEW COMPARISON:  Chest radiograph dated 07/02/2019. FINDINGS: Cardiomegaly with vascular congestion. No focal consolidation, pleural effusion, or pneumothorax. Evaluation however is limited as the left lung base and left costophrenic angle has been excluded from the image. No acute osseous pathology. IMPRESSION: Cardiomegaly with mild vascular congestion.  No focal consolidation. Electronically Signed   By: Anner Crete M.D.   On: 07/07/2021 20:10   CT ABDOMEN PELVIS W CONTRAST  Result  Date: 07/07/2021 CLINICAL DATA:  Abdominal pain and fever. EXAM: CT ABDOMEN AND PELVIS WITH CONTRAST TECHNIQUE: Multidetector CT imaging of the abdomen and pelvis was performed using the standard protocol following bolus administration of intravenous contrast. CONTRAST:  168m OMNIPAQUE IOHEXOL 350 MG/ML SOLN COMPARISON:  None. FINDINGS: Lower chest: No pleural effusion or focal airspace disease. Mitral annulus calcifications. Coronary artery calcifications, age advanced. Hepatobiliary: Enlarged liver spanning 22.5 cm cranial caudal with steatosis. No focal hepatic lesion. Hepatic contours are slightly nodular. Distended gallbladder with wall thickening up to 7 mm. Cholesterol as well as calcified gallstones in the gallbladder. No biliary dilatation or visualized choledocholithiasis. Pancreas: No ductal dilatation or inflammation. Spleen: Normal in size without focal abnormality. 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 No left adrenal nodule. Marked bilateral renal atrophy consistent with chronic renal disease. No hydronephrosis. Urinary bladder completely empty. Stomach/Bowel: No bowel obstruction or inflammation. Moderate colonic stool burden and tortuosity. Appendix tentatively visualized and normal. Majority of small bowel is decompressed. Fluid or ingested contents within the stomach. Vascular/Lymphatic: Advanced aortic and branch atherosclerosis, particularly for age. No aortic aneurysm. No acute vascular findings. Multiple small retroperitoneal lymph nodes, likely reactive. Prominent sized bilateral inguinal nodes have normal fatty hila. No suspicious abdominopelvic adenopathy. Reproductive: Other than uterine vascular calcifications, unremarkable CT appearance of the uterus. No adnexal mass. Other: Peritoneal dialysis catheter in place, tip in the right pelvis. Moderate volume of free fluid throughout the abdomen and pelvis. No loculated fluid collection.  Amorphous soft tissue calcifications in the anterior abdomen just beneath the abdominal wall musculature may be dystrophic or sequela of fat necrosis. No free intra-abdominal air. Scattered body wall edema. Musculoskeletal: Sequela of renal osteodystrophy. No acute osseous abnormalities. IMPRESSION: 1. Distended gallbladder with wall thickening up to 7 mm. This could represent acute cholecystitis in the appropriate clinical setting. Cholesterol as well as calcified gallstones in the gallbladder. No biliary dilatation or visualized choledocholithiasis. 2. Hepatomegaly and hepatic steatosis. Hepatic contours are slightly nodular, raising concern for cirrhosis. 3. Peritoneal dialysis catheter in place, tip in the right pelvis. Moderate volume of free fluid throughout the abdomen and pelvis. 4. Marked bilateral renal atrophy consistent with chronic renal disease. Aortic Atherosclerosis (ICD10-I70.0). Electronically Signed   By: MKeith RakeM.D.   On: 07/07/2021 21:52   NM Hepato W/EF  Result Date: 07/09/2021 CLINICAL DATA:  Sepsis. Multiple gallstones. Gallbladder wall thickening. Concern for acute cholecystitis. Of note patient had a borderline positive quantitative beta HCG. This is considered a spurious  finding not related to pregnancy. Patient denied chance of pregnancy. Risks and benefits of procedure described to patient by hospitalist attending. EXAM: NUCLEAR MEDICINE HEPATOBILIARY IMAGING WITH GALLBLADDER EF TECHNIQUE: Sequential images of the abdomen were obtained out to 60 minutes following intravenous administration of radiopharmaceutical. After oral ingestion of Ensure, gallbladder ejection fraction was determined. At 60 min, normal ejection fraction is greater than 33%. RADIOPHARMACEUTICALS:  5.3 mCi Tc-39m Choletec IV COMPARISON:  CT 07/07/2021, ultrasound 07/07/2021 FINDINGS: Prompt clearance of radiotracer from the small bowel and homogeneous uptake in liver. Gallbladder begins to fill at 15  minutes and continues to fill up to 60 minutes. Filling defect within the gallbladder related to gallstones. Small amount of counts are noted in the small bowel. Fatty meal was administered PO. Minimal but incomplete contraction of the gallbladder following fatty meal with ejection fraction calculated at 23%. Calculated gallbladder ejection fraction is 23%. (Normal gallbladder ejection fraction with Ensure is greater than 33%.) IMPRESSION: 1. Patent cystic duct and common bile duct. Findings inconsistent acute cholecystitis. 2. Multiple gallstones noted. 3. Low ejection fraction. Findings favor incomplete decompression of gallbladder related to multiple stones within the gallbladder versus chronic cholecystitis or a combination of both. Electronically Signed   By: SSuzy BouchardM.D.   On: 07/09/2021 12:32   [place]Abdomen Limited RUQ (LIVER/GB)  Result Date: 07/07/2021 CLINICAL DATA:  Right upper quadrant abdominal pain. EXAM: ULTRASOUND ABDOMEN LIMITED RIGHT UPPER QUADRANT COMPARISON:  CT abdomen pelvis dated 07/07/2021. FINDINGS: Gallbladder: The gallbladder is filled with stones. The gallbladder wall is minimally thickened measuring 4 mm. Negative sonographic Murphy's sign. Common bile duct: Diameter: 3 mm Liver: There is mild coarsened echotexture and surface irregularity concerning for changes of cirrhosis. Portal vein is patent on color Doppler imaging with normal direction of blood flow towards the liver. Other: Small ascites. IMPRESSION: 1. Stone filled gallbladder without definite sonographic evidence of acute cholecystitis. A hepatobiliary scintigraphy may provide better evaluation of the gallbladder if there is a high clinical concern for acute cholecystitis . 2. Possible early changes of cirrhosis. 3. Small ascites. Electronically Signed   By: AAnner CreteM.D.   On: 07/07/2021 23:28     Medications:    cefTRIAXone (ROCEPHIN)  IV 2 g (07/09/21 1316)   dialysis solution 1.5% low-MG/low-CA      metronidazole Stopped (07/09/21 [id number]    aspirin EC  81 mg Oral Daily   atorvastatin  20 mg Oral Daily   calcium acetate  1,334 mg Oral TID WC   Chlorhexidine Gluconate Cloth  6 each Topical Daily   gentamicin cream  1 application Topical Daily   heparin  5,000 Units Subcutaneous Q8H   lactulose  20 g Oral BID   heparin, ondansetron **OR** ondansetron (ZOFRAN) IV  Assessment/ Plan:  35y.o. female with end-stage renal disease on peritoneal dialysis, calciphylaxis, morbid obesity, type 2 diabetes, secondary hyperparathyroidism, peripheral vascular disease, with history of toe amputations.  Ligation of left brachiocephalic AV fistula in August 2022 for steal syndrome, left hand ulcer, ? Early cirrhosis was admitted on 07/07/2021 for  Principal Problem:   Severe sepsis ([facility] Active Problems:   Anemia in chronic kidney disease   HTN (hypertension)   ESRD (end stage renal disease) (HCC)   Chronic diastolic CHF (congestive heart failure) (HCC)   Essential hypertension   Cholecystitis, unspecified  RUQ pain [R10.11] Hypotension [I95.9] Sepsis ([place] [A41.9] Sepsis, due to unspecified organism, unspecified whether acute organ dysfunction present ([place] [A41.9]  #. ESRD CCPD ordered.  CCPD will be set up if patient is to stay in the hospital tonight otherwise continue home prescription if discharged.  #. Anemia of CKD  Lab Results  Component Value Date   HGB 11.2 (L) 07/09/2021   EPO dose as outpatient.  Hemoglobin acceptable.  #. Secondary hyperparathyroidism of renal origin N 25.81      Component Value Date/Time   PTH 217 (H) 02/11/2016 1317   Lab Results  Component Value Date   PHOS 9.9 (H) 09/17/2019   Monitor calcium and phos level during this admission Phosphorus elevated.  Continue low phosphorus diet and calcium acetate Phos binders.  #. Diabetes type 2 with CKD Hgb A1c MFr Bld (%)  Date Value  07/04/2019 5.1    #Nausea and vomiting With lactic  acidosis CT of the abdomen suspicious for acute cholecystitis Overall patient is clinically improved HIDA scan report appears to be negative   LOS: [place] 9/23/20223:26 [place], [place]

## 2021-07-09 NOTE — Discharge Summary (Signed)
Physician Discharge Summary  [name] [id number] DOB: 03/20/1986 DOA: 07/07/2021  PCP: Danelle Berry, NP  Admit date: 07/07/2021 Discharge date: 07/09/2021  Admitted From: Home  Disposition:  Home   Recommendations for Outpatient Follow-up:  Follow up with Evern Bio PCP in 1 week Ms Charlynn Grimes: Please check BP and restart antihypertensives as needed Ms Charlynn Grimes: Please obtain BMP in one week       Home Health: None  Equipment/Devices: None new  Discharge Condition: Good  CODE STATUS: FULL Diet recommendation: Renal   Brief/Interim Summary: Mrs. Lenser is a 35 y.o. 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  In the ER she was hypotensive, pulse 115, respirations 23, elevated lactic acid, elevated white count.  Chest x-ray was clear, she does not make urine, and CT of the abdomen and pelvis showed possibly distended gallbladder, but no other abnormal findings.  She was started on antibiotics and admitted for presumed sepsis.       PRINCIPAL HOSPITAL DIAGNOSIS: Dehydration    Discharge Diagnoses:  Dehydration Sepsis was ultimately ruled out.  Blood cultures remained aseptic, she had no respiratory symptoms and clear chest x-ray.  She does not have enough urine in the bladder to make UTI, nor did she have clinical symptoms of urinary retention, flank pain, or pyelonephritis on CT imaging.  Cholecystitis was considered, but a HIDA scan ruled out impacted gallbladder and clinically she had absolutely no symptoms of cholecystitis at all, tolerated diet well and had no abdominal pain.  She had no signs of central nervous system infection, and peritoneal fluid was tested but normal cell count and exam ruled out peritonitis.   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 Loleta Books: You were admitted for vomiting and feeling sick. Here, we initially thought your had an infection. We looked everywhere and all your testing appeared normal though (no infection in the blood, no infection in the lungs, no infection in the peritoneal fluid or the gallbladder or the intestines.    For constipation, take lactulose 20g (36m) twice daily Start with once daily, increase to twice daily if needed  You may also try the stimulant laxative senna-docusate, one or two tabs daily    Your blood pressure remains on the lower side for you, so when you leave, don't immediately start your home blood pressure medicines  You may take torsemide daily  But HOLD (do not take) your: Amlodipine Carvedilol Losartan Imdur  Clonidine  Go see your primary care [place] in 1 week Check your blood pressure daily in the next week, right [facility] If your blood pressure goes HIGHER than 150, you may restart amlodipine If it stays there that night, you can restart the carvedilol If it STAYS HIGHER than 150, you can restart losartan, Imdur and clonidine  Return for fever, vomiting that won't stop with ondansetron (the anti-nausea medicine I prescribed), or confusion  or dizziness that won't go away   Increase activity slowly   Complete by: As directed    No wound care   Complete by: As directed       Allergies as of 07/09/2021       Reactions   Tomato Itching   Tomato (diagnostic) Itching   Gabapentin Other (See Comments)   Extremity tremors         Medication List     STOP taking these medications    amLODipine 10 MG tablet Commonly known as: NORVASC   carvedilol 25 MG tablet Commonly known as: COREG   cloNIDine 0.2 MG tablet Commonly known as: CATAPRES   isosorbide mononitrate 30 MG 24 hr tablet Commonly known as: IMDUR   losartan 100 MG tablet Commonly known as: COZAAR       TAKE these medications    ascorbic acid 500 MG tablet Commonly known as: VITAMIN C Take 1 tablet (500 mg total) by mouth 2 (two) times daily.   aspirin 81 MG EC tablet Take 1 tablet (81 mg total) by mouth daily.   atorvastatin 20 MG tablet Commonly known as: LIPITOR Take 20 mg by mouth daily.   calcium acetate 667 MG capsule Commonly known as: PHOSLO Take 2.0 Capsule Oral three times a day with meal   calcium citrate 950 (200 Ca) MG tablet Commonly known as: CALCITRATE - dosed in mg elemental calcium Take 200 mg of elemental calcium by mouth 3 (three) times daily.   cilostazol 100 MG tablet Commonly known as: PLETAL Take 1 tablet (100 mg total) by mouth 2 (two) times daily before a meal.   clopidogrel 75 MG tablet Commonly known as: Plavix Take 1 tablet (75 mg total) by mouth daily.   fluticasone 50 MCG/ACT nasal spray Commonly known as: FLONASE Place into the nose.   gentamicin cream 0.1 % Commonly known as: GARAMYCIN APPLY TO EXIT SITE DAILY   Klor-Con M20 20 MEQ tablet Generic drug: potassium chloride SA Take 20 mEq by mouth daily.   lactulose 10 GM/15ML solution Commonly known as: CHRONULAC Take 30 mLs (20 g total) by mouth 2 (two) times daily as needed for mild constipation.   multivitamin Tabs tablet Take 1 tablet by mouth at bedtime.   mupirocin ointment 2 % Commonly known as: BACTROBAN Apply 1 application topically 2 (two) times daily.   nitroGLYCERIN 2 % Oint ointment Commonly known as: NITROGLYN Apply 1 application topically every 12 (twelve) hours. Applied just behind the toes on the left and right  foot as needed   ondansetron 4 MG tablet Commonly known as: ZOFRAN Take 1 tablet (4 mg total) by mouth every 6 (six) hours as needed for nausea.   oxyCODONE-acetaminophen 5-325 MG tablet Commonly known as: PERCOCET/ROXICET Take 1 tablet by mouth every 6 (six) hours as needed for severe pain.   RA Povidine Iodine 10 % ointment Generic drug: povidone-iodine Apply 1 application topically as needed for wound care.   sodium thiosulfate 25 % injection Inject 12.5 g into the vein once.   torsemide 100 MG tablet Commonly known as: DEMADEX Take 100 mg by mouth every morning.   Vitamin D (Ergocalciferol) 1.25 MG (50000 UNIT) Caps capsule Commonly known as: DRISDOL Take 50,000 Units by mouth once a week.   Vitamin D 50 MCG (2000 UT) Caps Take by mouth daily.        Follow-up Information     Danelle Berry, NP. Schedule an appointment as soon as possible for a  visit in 1 week(s).   Specialty: Nurse Practitioner Contact information: [facility] [place] [address] [phone]                Allergies  Allergen Reactions   Tomato Itching   Tomato (Diagnostic) Itching   Gabapentin Other (See Comments)    Extremity tremors    Consultations: Nephrology   Procedures/Studies: DG Chest 1 View  Result Date: 07/07/2021 CLINICAL DATA:  Hypotension. EXAM: CHEST  1 VIEW COMPARISON:  Chest radiograph dated 07/02/2019. FINDINGS: Cardiomegaly with vascular congestion. No focal consolidation, pleural effusion, or pneumothorax. Evaluation however is limited as the left lung base and left costophrenic angle has been excluded from the image. No acute osseous pathology. IMPRESSION: Cardiomegaly with mild vascular congestion.  No focal consolidation. Electronically Signed   By: Anner Crete M.D.   On: 07/07/2021 20:10   CT ABDOMEN PELVIS W CONTRAST  Result Date: 07/07/2021 CLINICAL DATA:  Abdominal pain and fever. EXAM: CT ABDOMEN AND PELVIS WITH CONTRAST TECHNIQUE:  Multidetector CT imaging of the abdomen and pelvis was performed using the standard protocol following bolus administration of intravenous contrast. CONTRAST:  13m OMNIPAQUE IOHEXOL 350 MG/ML SOLN COMPARISON:  None. FINDINGS: Lower chest: No pleural effusion or focal airspace disease. Mitral annulus calcifications. Coronary artery calcifications, age advanced. Hepatobiliary: Enlarged liver spanning 22.5 cm cranial caudal with steatosis. No focal hepatic lesion. Hepatic contours are slightly nodular. Distended gallbladder with wall thickening up to 7 mm. Cholesterol as well as calcified gallstones in the gallbladder. No biliary dilatation or visualized choledocholithiasis. Pancreas: No ductal dilatation or inflammation. Spleen: Normal in size without focal abnormality. 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 No left adrenal nodule. Marked bilateral renal atrophy consistent with chronic renal disease. No hydronephrosis. Urinary bladder completely empty. Stomach/Bowel: No bowel obstruction or inflammation. Moderate colonic stool burden and tortuosity. Appendix tentatively visualized and normal. Majority of small bowel is decompressed. Fluid or ingested contents within the stomach. Vascular/Lymphatic: Advanced aortic and branch atherosclerosis, particularly for age. No aortic aneurysm. No acute vascular findings. Multiple small retroperitoneal lymph nodes, likely reactive. Prominent sized bilateral inguinal nodes have normal fatty hila. No suspicious abdominopelvic adenopathy. Reproductive: Other than uterine vascular calcifications, unremarkable CT appearance of the uterus. No adnexal mass. Other: Peritoneal dialysis catheter in place, tip in the right pelvis. Moderate volume of free fluid throughout the abdomen and pelvis. No loculated fluid collection. Amorphous soft tissue calcifications in the anterior abdomen just beneath the abdominal wall musculature may be  dystrophic or sequela of fat necrosis. No free intra-abdominal air. Scattered body wall edema. Musculoskeletal: Sequela of renal osteodystrophy. No acute osseous abnormalities. IMPRESSION: 1. Distended gallbladder with wall thickening up to 7 mm. This could represent acute cholecystitis in the appropriate clinical setting. Cholesterol as well as calcified gallstones in the gallbladder. No biliary dilatation or visualized choledocholithiasis. 2. Hepatomegaly and hepatic steatosis. Hepatic contours are slightly nodular, raising concern for cirrhosis. 3. Peritoneal dialysis catheter in place, tip in the right pelvis. Moderate volume of free fluid throughout the abdomen and pelvis. 4. Marked bilateral renal atrophy consistent with chronic renal disease. Aortic Atherosclerosis (ICD10-I70.0). Electronically Signed   By: MKeith RakeM.D.   On: 07/07/2021 21:52   NM Hepato W/EF  Result Date: 07/09/2021 CLINICAL DATA:  Sepsis. Multiple gallstones. Gallbladder wall thickening. Concern for acute cholecystitis. Of note patient had a borderline positive quantitative beta HCG. This is considered a spurious finding not related to pregnancy. Patient denied chance of  pregnancy. Risks and benefits of procedure described to patient by hospitalist attending. EXAM: NUCLEAR MEDICINE HEPATOBILIARY IMAGING WITH GALLBLADDER EF TECHNIQUE: Sequential images of the abdomen were obtained out to 60 minutes following intravenous administration of radiopharmaceutical. After oral ingestion of Ensure, gallbladder ejection fraction was determined. At 60 min, normal ejection fraction is greater than 33%. RADIOPHARMACEUTICALS:  5.3 mCi Tc-60m Choletec IV COMPARISON:  CT 07/07/2021, ultrasound 07/07/2021 FINDINGS: Prompt clearance of radiotracer from the small bowel and homogeneous uptake in liver. Gallbladder begins to fill at 15 minutes and continues to fill up to 60 minutes. Filling defect within the gallbladder related to gallstones. Small  amount of counts are noted in the small bowel. Fatty meal was administered PO. Minimal but incomplete contraction of the gallbladder following fatty meal with ejection fraction calculated at 23%. Calculated gallbladder ejection fraction is 23%. (Normal gallbladder ejection fraction with Ensure is greater than 33%.) IMPRESSION: 1. Patent cystic duct and common bile duct. Findings inconsistent acute cholecystitis. 2. Multiple gallstones noted. 3. Low ejection fraction. Findings favor incomplete decompression of gallbladder related to multiple stones within the gallbladder versus chronic cholecystitis or a combination of both. Electronically Signed   By: SSuzy BouchardM.D.   On: 07/09/2021 12:32   UKoreaAbdomen Limited RUQ (LIVER/GB)  Result Date: 07/07/2021 CLINICAL DATA:  Right upper quadrant abdominal pain. EXAM: ULTRASOUND ABDOMEN LIMITED RIGHT UPPER QUADRANT COMPARISON:  CT abdomen pelvis dated 07/07/2021. FINDINGS: Gallbladder: The gallbladder is filled with stones. The gallbladder wall is minimally thickened measuring 4 mm. Negative sonographic Murphy's sign. Common bile duct: Diameter: 3 mm Liver: There is mild coarsened echotexture and surface irregularity concerning for changes of cirrhosis. Portal vein is patent on color Doppler imaging with normal direction of blood flow towards the liver. Other: Small ascites. IMPRESSION: 1. Stone filled gallbladder without definite sonographic evidence of acute cholecystitis. A hepatobiliary scintigraphy may provide better evaluation of the gallbladder if there is a high clinical concern for acute cholecystitis . 2. Possible early changes of cirrhosis. 3. Small ascites. Electronically Signed   By: AAnner CreteM.D.   On: 07/07/2021 23:28      Subjective: Patient is feeling well, she is a little tired, but her appetite is good, she has had no fever, no vomiting, no confusion.  No respiratory symptoms, no diarrhea.  No change in her symptoms.  Discharge  Exam: Vitals:   07/09/21 0405 07/09/21 0849  BP: 121/75 128/78  Pulse: 95 93  Resp: 16 18  Temp: 98.7 F (37.1 C) 98.3 F (36.8 C)  SpO2:  100%   Vitals:   07/08/21 1957 07/08/21 2130 07/09/21 0405 07/09/21 0849  BP: 92/65  121/75 128/78  Pulse: 96  95 93  Resp: '18  16 18  '$ Temp: 98.5 F (36.9 C)  98.7 F (37.1 C) 98.3 F (36.8 C)  TempSrc: Oral  Oral Oral  SpO2:    100%  Weight:  128.4 kg    Height:        General: Pt is alert, awake, not in acute distress Cardiovascular: RRR, nl S1-S2, no murmurs appreciated.   No LE edema.   Respiratory: Normal respiratory rate and rhythm.  CTAB without rales or wheezes. Abdominal: Abdomen soft and non-tender.  No distension or HSM.   Neuro/Psych: Strength symmetric in upper and lower extremities.  Judgment and insight appear normal.   The results of significant diagnostics from this hospitalization (including imaging, microbiology, ancillary and laboratory) are listed below for reference.  Microbiology: Recent Results (from the past 240 hour(s))  Blood culture (routine x 2)     Status: None (Preliminary result)   Collection Time: 07/07/21  9:04 PM   Specimen: BLOOD  Result Value Ref Range Status   Specimen Description BLOOD RIGHT ANTECUBITAL  Final   Special Requests   Final    BOTTLES DRAWN AEROBIC AND ANAEROBIC Blood Culture results may not be optimal due to an inadequate volume of blood received in culture bottles   Culture   Final    NO GROWTH 2 DAYS Performed at [facility], [address], [place], [place] [address]    Report Status PENDING  Incomplete  Blood culture (routine x 2)     Status: None (Preliminary result)   Collection Time: 07/07/21  9:04 PM   Specimen: BLOOD  Result Value Ref Range Status   Specimen Description BLOOD BLOOD RIGHT HAND  Final   Special Requests   Final    BOTTLES DRAWN AEROBIC AND ANAEROBIC Blood Culture adequate volume   Culture   Final    NO GROWTH 2 DAYS Performed at  [facility], [address] [place], [place] [address]    Report Status PENDING  Incomplete  Resp Panel by RT-PCR (Flu A&B, Covid) Nasopharyngeal Swab     Status: None   Collection Time: 07/07/21  9:06 PM   Specimen: Nasopharyngeal Swab; Nasopharyngeal(NP) swabs in vial transport medium  Result Value Ref Range Status   SARS Coronavirus 2 by RT PCR NEGATIVE NEGATIVE Final    Comment: (NOTE) SARS-CoV-2 target nucleic acids are NOT DETECTED.  The SARS-CoV-2 RNA is generally detectable in upper respiratory specimens during the acute phase of infection. The lowest concentration of SARS-CoV-2 viral copies this assay can detect is 138 copies/mL. A negative result does not preclude SARS-Cov-2 infection and should not be used as the sole basis for treatment or other patient management decisions. A negative result may occur with  improper specimen collection/handling, submission of specimen other than nasopharyngeal swab, presence of viral mutation(s) within the areas targeted by this assay, and inadequate number of viral copies(<138 copies/mL). A negative result must be combined with clinical observations, patient history, and epidemiological information. The expected result is Negative.  Fact Sheet for Patients:  EntrepreneurPulse.com.au  Fact Sheet for Healthcare Providers:  IncredibleEmployment.be  This test is no t yet approved or cleared by the [place] FDA and  has been authorized for detection and/or diagnosis of SARS-CoV-2 by FDA under an Emergency Use Authorization (EUA). This EUA will remain  in effect (meaning this test can be used) for the duration of the COVID-19 declaration under Section 564(b)(1) of the Act, 21 U.S.C.section 360bbb-3(b)(1), unless the authorization is terminated  or revoked sooner.       Influenza A by PCR NEGATIVE NEGATIVE Final   Influenza B by PCR NEGATIVE NEGATIVE Final    Comment: (NOTE) The  Xpert Xpress SARS-CoV-2/FLU/RSV plus assay is intended as an aid in the diagnosis of influenza from Nasopharyngeal swab specimens and should not be used as a sole basis for treatment. Nasal washings and aspirates are unacceptable for Xpert Xpress SARS-CoV-2/FLU/RSV testing.  Fact Sheet for Patients: EntrepreneurPulse.com.au  Fact Sheet for Healthcare Providers: IncredibleEmployment.be  This test is not yet approved or cleared by the [place] FDA and has been authorized for detection and/or diagnosis of SARS-CoV-2 by FDA under an Emergency Use Authorization (EUA). This EUA will remain in effect (meaning this test can be used) for the duration  of the COVID-19 declaration under Section 564(b)(1) of the Act, 21 U.S.C. section 360bbb-3(b)(1), unless the authorization is terminated or revoked.  Performed at [facility], [address] [place], [place] [address]   Body fluid culture w Gram Stain     Status: None (Preliminary result)   Collection Time: 07/08/21 12:10 PM   Specimen: Peritoneal Dialysate; Body Fluid  Result Value Ref Range Status   Specimen Description   Final    PERITONEAL DIALYSATE Performed at [facility], [address] [place], [place] [address]    Special Requests   Final    Normal Performed at [facility], [address] [place], [place] [address]    Gram Stain   Final    NO SQUAMOUS EPITHELIAL CELLS SEEN NO WBC SEEN NO ORGANISMS SEEN    Culture   Final    NO GROWTH < 24 HOURS Performed at [facility], [address] [place], [place] [address]    Report Status PENDING  Incomplete     Labs: BNP (last 3 results) Recent Labs    07/07/21 1931  BNP [id number]   Basic Metabolic Panel: Recent Labs  Lab 07/07/21 1931 07/08/21 0638 07/09/21 0537  NA 132* 130* 130*  K 3.6 3.3* 3.0*  CL 89* 86* 89*  CO2 '23 23 22  '$ GLUCOSE 139* 97 90  BUN 55* 63* 61*  CREATININE 14.18*  14.62* 14.15*  CALCIUM 9.6 9.4 8.9   Liver Function Tests: Recent Labs  Lab 07/07/21 1931 07/08/21 0638  AST 14* 16  ALT 9 10  ALKPHOS 164* 137*  BILITOT 0.8 0.9  PROT 9.4* 8.6*  ALBUMIN 3.7 3.4*   Recent Labs  Lab 07/07/21 1931  LIPASE 43   No results for input(s): AMMONIA in the last 168 hours. CBC: Recent Labs  Lab 07/07/21 1931 07/08/21 0638 07/09/21 0537  WBC 21.6* 21.9* 18.0*  NEUTROABS 16.8*  --   --   HGB 12.2 11.4* 11.2*  HCT 38.5 34.6* 33.3*  MCV 95.3 96.4 95.4  PLT 213 173 163   Cardiac Enzymes: No results for input(s): CKTOTAL, CKMB, CKMBINDEX, TROPONINI in the last 168 hours. BNP: Invalid input(s): POCBNP CBG: No results for input(s): GLUCAP in the last 168 hours. D-Dimer No results for input(s): DDIMER in the last 72 hours. Hgb A1c No results for input(s): HGBA1C in the last 72 hours. Lipid Profile No results for input(s): CHOL, HDL, LDLCALC, TRIG, CHOLHDL, LDLDIRECT in the last 72 hours. Thyroid function studies No results for input(s): TSH, T4TOTAL, T3FREE, THYROIDAB in the last 72 hours.  Invalid input(s): FREET3 Anemia work up No results for input(s): VITAMINB12, FOLATE, FERRITIN, TIBC, IRON, RETICCTPCT in the last 72 hours. Urinalysis    Component Value Date/Time   COLORURINE YELLOW (A) 02/11/2016 0200   APPEARANCEUR CLEAR (A) 02/11/2016 0200   LABSPEC 1.011 02/11/2016 0200   PHURINE 5.0 02/11/2016 0200   GLUCOSEU 50 (A) 02/11/2016 0200   HGBUR NEGATIVE 02/11/2016 0200   BILIRUBINUR NEGATIVE 02/11/2016 0200   KETONESUR NEGATIVE 02/11/2016 0200   PROTEINUR >500 (A) 02/11/2016 0200   NITRITE NEGATIVE 02/11/2016 0200   LEUKOCYTESUR NEGATIVE 02/11/2016 0200   Sepsis Labs Invalid input(s): PROCALCITONIN,  WBC,  LACTICIDVEN Microbiology Recent Results (from the past 240 hour(s))  Blood culture (routine x 2)     Status: None (Preliminary result)   Collection Time: 07/07/21  9:04 PM   Specimen: BLOOD  Result Value Ref Range Status    Specimen Description BLOOD  RIGHT ANTECUBITAL  Final   Special Requests   Final    BOTTLES DRAWN AEROBIC AND ANAEROBIC Blood Culture results may not be optimal due to an inadequate volume of blood received in culture bottles   Culture   Final    NO GROWTH 2 DAYS Performed at [facility], [address], [place], [place] [address]    Report Status PENDING  Incomplete  Blood culture (routine x 2)     Status: None (Preliminary result)   Collection Time: 07/07/21  9:04 PM   Specimen: BLOOD  Result Value Ref Range Status   Specimen Description BLOOD BLOOD RIGHT HAND  Final   Special Requests   Final    BOTTLES DRAWN AEROBIC AND ANAEROBIC Blood Culture adequate volume   Culture   Final    NO GROWTH 2 DAYS Performed at [facility], [address] [place], [place] [address]    Report Status PENDING  Incomplete  Resp Panel by RT-PCR (Flu A&B, Covid) Nasopharyngeal Swab     Status: None   Collection Time: 07/07/21  9:06 PM   Specimen: Nasopharyngeal Swab; Nasopharyngeal(NP) swabs in vial transport medium  Result Value Ref Range Status   SARS Coronavirus 2 by RT PCR NEGATIVE NEGATIVE Final    Comment: (NOTE) SARS-CoV-2 target nucleic acids are NOT DETECTED.  The SARS-CoV-2 RNA is generally detectable in upper respiratory specimens during the acute phase of infection. The lowest concentration of SARS-CoV-2 viral copies this assay can detect is 138 copies/mL. A negative result does not preclude SARS-Cov-2 infection and should not be used as the sole basis for treatment or other patient management decisions. A negative result may occur with  improper specimen collection/handling, submission of specimen other than nasopharyngeal swab, presence of viral mutation(s) within the areas targeted by this assay, and inadequate number of viral copies(<138 copies/mL). A negative result must be combined with clinical observations, patient history, and  epidemiological information. The expected result is Negative.  Fact Sheet for Patients:  EntrepreneurPulse.com.au  Fact Sheet for Healthcare Providers:  IncredibleEmployment.be  This test is no t yet approved or cleared by the [place] FDA and  has been authorized for detection and/or diagnosis of SARS-CoV-2 by FDA under an Emergency Use Authorization (EUA). This EUA will remain  in effect (meaning this test can be used) for the duration of the COVID-19 declaration under Section 564(b)(1) of the Act, 21 U.S.C.section 360bbb-3(b)(1), unless the authorization is terminated  or revoked sooner.       Influenza A by PCR NEGATIVE NEGATIVE Final   Influenza B by PCR NEGATIVE NEGATIVE Final    Comment: (NOTE) The Xpert Xpress SARS-CoV-2/FLU/RSV plus assay is intended as an aid in the diagnosis of influenza from Nasopharyngeal swab specimens and should not be used as a sole basis for treatment. Nasal washings and aspirates are unacceptable for Xpert Xpress SARS-CoV-2/FLU/RSV testing.  Fact Sheet for Patients: EntrepreneurPulse.com.au  Fact Sheet for Healthcare Providers: IncredibleEmployment.be  This test is not yet approved or cleared by the [place] FDA and has been authorized for detection and/or diagnosis of SARS-CoV-2 by FDA under an Emergency Use Authorization (EUA). This EUA will remain in effect (meaning this test can be used) for the duration of the COVID-19 declaration under Section 564(b)(1) of the Act, 21 U.S.C. section 360bbb-3(b)(1), unless the authorization is terminated or revoked.  Performed at [facility], [address], [place], [place] [address]   Body fluid culture w Gram Stain  Status: None (Preliminary result)   Collection Time: 07/08/21 12:10 PM   Specimen: Peritoneal Dialysate; Body Fluid  Result Value Ref Range Status   Specimen Description   Final     PERITONEAL DIALYSATE Performed at [facility], [address] [place], [place] [address]    Special Requests   Final    Normal Performed at [facility], [address] [place], [place] [address]    Gram Stain   Final    NO SQUAMOUS EPITHELIAL CELLS SEEN NO WBC SEEN NO ORGANISMS SEEN    Culture   Final    NO GROWTH < 24 HOURS Performed at [facility], [place] [address], [place], [place] [address]    Report Status PENDING  Incomplete     Time coordinating discharge: 25 minutes The [place] controlled substances registry was reviewed for this patient     30 Day Unplanned Readmission Risk Score    Flowsheet Row ED to Hosp-Admission (Current) from 07/07/2021 in [facility]  30 Day Unplanned Readmission Risk Score (%) 16.06 Filed at 07/09/2021 1600       This score is the patient's risk of an unplanned readmission within 30 days of being discharged (0 -100%). The score is based on dignosis, age, lab data, medications, orders, and past utilization.   Low:  0-14.9   Medium: 15-21.9   High: 22-29.9   Extreme: 30 and above            SIGNED:   Edwin Dada, MD  [facility] 07/09/2021, 5:56 PM

## 2021-07-12 LAB — CULTURE, BLOOD (ROUTINE X 2)
Culture: NO GROWTH
Culture: NO GROWTH
Special Requests: ADEQUATE

## 2021-07-12 LAB — BODY FLUID CULTURE W GRAM STAIN
Culture: NO GROWTH
Gram Stain: NONE SEEN
Special Requests: NORMAL

## 2021-07-19 ENCOUNTER — Encounter: Payer: BLUE CROSS/BLUE SHIELD | Admitting: Podiatry

## 2021-07-21 ENCOUNTER — Other Ambulatory Visit: Payer: Self-pay

## 2021-07-21 ENCOUNTER — Encounter: Payer: Self-pay | Admitting: Podiatry

## 2021-07-21 ENCOUNTER — Ambulatory Visit (INDEPENDENT_AMBULATORY_CARE_PROVIDER_SITE_OTHER): Payer: Medicare Other | Admitting: Podiatry

## 2021-07-21 DIAGNOSIS — L97522 Non-pressure chronic ulcer of other part of left foot with fat layer exposed: Secondary | ICD-10-CM | POA: Diagnosis not present

## 2021-07-21 DIAGNOSIS — I96 Gangrene, not elsewhere classified: Secondary | ICD-10-CM

## 2021-07-26 NOTE — Progress Notes (Signed)
  Subjective:  Patient ID: [name], female    DOB: December 23, 1985,  MRN: [id number]  Chief Complaint  Patient presents with   Routine Post Op       35 y.o. female returns for post-op check.  She has been trying to rest and stay off it is much as possible the drainage is reducing  Review of Systems: Negative except as noted in the HPI. Denies N/V/F/Ch.   Objective:  There were no vitals filed for this visit. There is no height or weight on file to calculate BMI. Constitutional Well developed. Well nourished.  Vascular Unchanged vascular status  Neurologic Normal speech. Oriented to person, place, and time. Epicritic sensation to light touch grossly present bilaterally.  Dermatologic Incision on the right side continues to heal there is minor central dehiscence that is superficial and does not require debridement.  The left side has a dehiscence measuring 5.5 x 1.0 x 1.1 cm with exposed subcutaneous tissue and probes deep.  No cellulitis purulence or signs of acute infection  Orthopedic: Tenderness to palpation noted about the surgical site.     Assessment:   1. Ulcer of left foot with fat layer exposed ([place])   2. Gangrene of toe of right foot ([place])     Plan:  Patient was evaluated and treated and all questions answered.  S/p foot surgery bilaterally -Continue iodine ointment application to the right side  Ulcer left foot secondary to surgical dehiscence -We discussed the etiology and factors that are a part of the wound healing process.  We also discussed the risk of infection both soft tissue and osteomyelitis from open ulceration.  Discussed the risk of limb loss if this happens or worsens. -Debridement as below. -Dressed with Prisma, DSD.  She will continue this at home.  Wound dressing order forms were sent to [facility] -She remains at high risk of limb loss on the left side and if this continues to worsen or does not heal she may be facing at least a transmetatarsal  amputation -Continue off-loading with surgical shoe.  Procedure: Excisional Debridement of Wound Rationale: Removal of non-viable soft tissue from the wound to promote healing.  Anesthesia: none Post-Debridement Wound Measurements: 5.5 cm x 1.0 cm x 1.1 cm  Type of Debridement: Sharp Excisional Tissue Removed: Non-viable soft tissue Depth of Debridement: subcutaneous tissue. Technique: Sharp excisional debridement to bleeding, viable wound base.  Dressing: Dry, sterile, compression dressing. Disposition: Patient tolerated procedure well.   Return in about 3 weeks (around 08/11/2021) for wound care.

## 2021-07-29 ENCOUNTER — Other Ambulatory Visit (INDEPENDENT_AMBULATORY_CARE_PROVIDER_SITE_OTHER): Payer: Self-pay | Admitting: Vascular Surgery

## 2021-07-29 ENCOUNTER — Telehealth: Payer: Self-pay | Admitting: *Deleted

## 2021-07-29 DIAGNOSIS — I739 Peripheral vascular disease, unspecified: Secondary | ICD-10-CM

## 2021-07-29 NOTE — Telephone Encounter (Signed)
Fone

## 2021-07-29 NOTE — Telephone Encounter (Signed)
I left a message on [name]'s voicemail to ignore the previous message from [name].  I explained to her that the bag that we gave her has everything that is needed for the wound care.  There's Mepitel in the bag that Dr. Sherryle Lis said was okay to use on the wound.  Apparently your mother asked for Pomogran / Prisma and was given a couple.  That's all that we can afford to give, we're running low on our wound care supplies.  If you run out of the Prisma before your supplies come in use the Mepitel.  I called the mother's number and [name] answered.  I gave her the above information.  She stated she understood.

## 2021-07-30 ENCOUNTER — Ambulatory Visit (INDEPENDENT_AMBULATORY_CARE_PROVIDER_SITE_OTHER): Payer: Medicare Other

## 2021-07-30 ENCOUNTER — Encounter (INDEPENDENT_AMBULATORY_CARE_PROVIDER_SITE_OTHER): Payer: Self-pay | Admitting: Nurse Practitioner

## 2021-07-30 ENCOUNTER — Ambulatory Visit (INDEPENDENT_AMBULATORY_CARE_PROVIDER_SITE_OTHER): Payer: Medicare Other | Admitting: Nurse Practitioner

## 2021-07-30 ENCOUNTER — Other Ambulatory Visit: Payer: Self-pay

## 2021-07-30 VITALS — BP 168/113 | HR 96 | Resp 16 | Wt 294.0 lb

## 2021-07-30 DIAGNOSIS — Z95820 Peripheral vascular angioplasty status with implants and grafts: Secondary | ICD-10-CM | POA: Diagnosis not present

## 2021-07-30 DIAGNOSIS — I739 Peripheral vascular disease, unspecified: Secondary | ICD-10-CM | POA: Diagnosis not present

## 2021-07-30 DIAGNOSIS — I70262 Atherosclerosis of native arteries of extremities with gangrene, left leg: Secondary | ICD-10-CM

## 2021-07-30 DIAGNOSIS — I70221 Atherosclerosis of native arteries of extremities with rest pain, right leg: Secondary | ICD-10-CM

## 2021-07-30 DIAGNOSIS — I10 Essential (primary) hypertension: Secondary | ICD-10-CM

## 2021-07-30 DIAGNOSIS — I70245 Atherosclerosis of native arteries of left leg with ulceration of other part of foot: Secondary | ICD-10-CM

## 2021-07-30 MED ORDER — CLOPIDOGREL BISULFATE 75 MG PO TABS: 75.0000 mg | ORAL_TABLET | Freq: Every day | ORAL | 11 refills | Status: DC

## 2021-07-30 NOTE — Progress Notes (Signed)
Subjective:    Patient ID: [name], female    DOB: 1985/12/22, 35 y.o.   MRN: [id number] No chief complaint on file.   [name] is a 35 year old female that is well-known to our practice.  The patient's mother contacted our practice notes that she had some worsening discoloration on her toes of her right foot.  The patient has had previous angiograms on her bilateral lower extremities.  The patient is a 21 feels cold Schnier mid calf area down to her foot.  Despite the cold patient does not have any pain.  She does have some persistent numbness.  The patient also previously had amputations of the left toes except for the great toe.  She notes that the wound is mostly healed however there is an area of dehiscence that is having heal from the inside out.  Today noninvasive studies show monophasic waveforms in the right lower extremity from the distal common femoral artery down to the distal popliteal.  Beginning in the anterior tibial artery there is no tibial artery waveforms visualized.  The left lower extremity has monophasic waveforms from the common femoral artery down to the distal posterior tibial artery.  There is an occlusion noted in the left peroneal artery but there are monophasic waveforms in the distal anterior tibial and posterior tibial arteries.  ABIs were noted to be 0 on the right the left ABIs were technically difficult due to bandaging and not reliable.   Review of Systems  Skin:  Positive for color change and wound.  All other systems reviewed and are negative.     Objective:   Physical Exam Vitals reviewed.  HENT:     Head: Normocephalic.  Cardiovascular:     Rate and Rhythm: Normal rate.     Pulses:          Dorsalis pedis pulses are 0 on the right side and detected w/ Doppler on the left side.       Posterior tibial pulses are 0 on the right side and detected w/ Doppler on the left side.  Pulmonary:     Effort: Pulmonary effort is normal.   Neurological:     Mental Status: She is alert and oriented to person, place, and time.  Psychiatric:        Mood and Affect: Mood normal.        Behavior: Behavior normal.        Thought Content: Thought content normal.        Judgment: Judgment normal.    BP (!) 168/113 (BP Location: Right Arm)   Pulse 96   Resp 16   Wt 294 lb (133.4 kg)   BMI 47.45 kg/m   Past Medical History:  Diagnosis Date   Anemia of chronic disease    Asthma    Calciphylaxis 2020   CHF (congestive heart failure) (HCC)    CKD (chronic kidney disease), stage III (HCC)    Diabetes ([place])    type 2   Hypertension    Morbid obesity (HCC)     Social History   Socioeconomic History   Marital status: Single    Spouse name: Not on file   Number of children: 0   Years of education: Not on file   Highest education level: Not on file  Occupational History   Occupation: works at [employer] 6   Tobacco Use   Smoking status: Former    Types: Cigars   Smokeless tobacco: Never  Media planner  Vaping Use: Never used  Substance and Sexual Activity   Alcohol use: No    Alcohol/week: 0.0 standard drinks   Drug use: No   Sexual activity: Yes    Birth control/protection: None  Other Topics Concern   Not on file  Social History Narrative   Lives with mother    Social Determinants of Health   Financial Resource Strain: Not on file  Food Insecurity: Not on file  Transportation Needs: Not on file  Physical Activity: Not on file  Stress: Not on file  Social Connections: Not on file  Intimate Partner Violence: Not on file    Past Surgical History:  Procedure Laterality Date   A/V FISTULAGRAM Left 10/29/2020   Procedure: A/V FISTULAGRAM;  Surgeon: Algernon Huxley, MD;  Location: [facility];  Service: Cardiovascular;  Laterality: Left;   APPLICATION OF WOUND VAC Bilateral 09/15/2019   Procedure: APPLICATION OF WOUND VAC;  Surgeon: Benjamine Sprague, DO;  Location: [facility];  Service: General;  Laterality:  Bilateral;   AV FISTULA PLACEMENT Left 07/09/2020   Procedure: ARTERIOVENOUS (AV) FISTULA CREATION ( BRACHIAL CEPHALIC);  Surgeon: Algernon Huxley, MD;  Location: [facility];  Service: Vascular;  Laterality: Left;   DIALYSIS/PERMA CATHETER INSERTION N/A 02/28/2019   Procedure: DIALYSIS/PERMA CATHETER INSERTION;  Surgeon: Algernon Huxley, MD;  Location: [facility];  Service: Cardiovascular;  Laterality: N/A;   DIALYSIS/PERMA CATHETER INSERTION N/A 09/17/2019   Procedure: DIALYSIS/PERMA CATHETER EXCHANGE;  Surgeon: Katha Cabal, MD;  Location: [facility];  Service: Cardiovascular;  Laterality: N/A;   DIALYSIS/PERMA CATHETER REMOVAL N/A 03/18/2020   Procedure: DIALYSIS/PERMA CATHETER REMOVAL;  Surgeon: Algernon Huxley, MD;  Location: [facility];  Service: Cardiovascular;  Laterality: N/A;   LIGATION OF ARTERIOVENOUS  FISTULA Left 06/03/2021   Procedure: LIGATION OF ARTERIOVENOUS  FISTULA;  Surgeon: Algernon Huxley, MD;  Location: [facility];  Service: Vascular;  Laterality: Left;   LOWER EXTREMITY ANGIOGRAPHY Left 12/02/2020   Procedure: LOWER EXTREMITY ANGIOGRAPHY;  Surgeon: Algernon Huxley, MD;  Location: [facility];  Service: Cardiovascular;  Laterality: Left;   LOWER EXTREMITY ANGIOGRAPHY Right 01/25/2021   Procedure: LOWER EXTREMITY ANGIOGRAPHY;  Surgeon: Algernon Huxley, MD;  Location: [facility];  Service: Cardiovascular;  Laterality: Right;   LOWER EXTREMITY ANGIOGRAPHY Left 04/15/2021   Procedure: LOWER EXTREMITY ANGIOGRAPHY;  Surgeon: Algernon Huxley, MD;  Location: [facility];  Service: Cardiovascular;  Laterality: Left;   LOWER EXTREMITY ANGIOGRAPHY Right 05/20/2021   Procedure: LOWER EXTREMITY ANGIOGRAPHY;  Surgeon: Algernon Huxley, MD;  Location: [facility];  Service: Cardiovascular;  Laterality: Right;   LOWER EXTREMITY ANGIOGRAPHY Left 05/27/2021   Procedure: LOWER EXTREMITY ANGIOGRAPHY;  Surgeon: Algernon Huxley, MD;  Location: [facility];   Service: Cardiovascular;  Laterality: Left;   pd cath  05/21/2019   TONSILLECTOMY     UPPER EXTREMITY ANGIOGRAPHY Left 04/15/2021   Procedure: UPPER EXTREMITY ANGIOGRAPHY;  Surgeon: Algernon Huxley, MD;  Location: [facility];  Service: Cardiovascular;  Laterality: Left;   WOUND DEBRIDEMENT Bilateral 09/15/2019   Procedure: DEBRIDEMENT WOUND;  Surgeon: Benjamine Sprague, DO;  Location: [facility];  Service: General;  Laterality: Bilateral;    Family History  Problem Relation Age of Onset   Hypertension Mother    Multiple sclerosis Mother    CAD Mother    Cancer Maternal Grandmother        Ovarian  Cancer Maternal Grandfather        Prostate    Kidney disease Father     Allergies  Allergen Reactions   Tomato Itching   Tomato (Diagnostic) Itching   Gabapentin Other (See Comments)    Extremity tremors    CBC Latest Ref Rng & Units 07/09/2021 07/08/2021 07/07/2021  WBC 4.0 - 10.5 K/uL 18.0(H) 21.9(H) 21.6(H)  Hemoglobin 12.0 - 15.0 g/dL 11.2(L) 11.4(L) 12.2  Hematocrit 36.0 - 46.0 % 33.3(L) 34.6(L) 38.5  Platelets 150 - 400 K/uL 163 173 213      CMP     Component Value Date/Time   NA 130 (L) 07/09/2021 0537   NA 135 (L) 12/31/2013 1020   K 3.0 (L) 07/09/2021 0537   K 3.9 12/31/2013 1020   CL 89 (L) 07/09/2021 0537   CL 105 12/31/2013 1020   CO2 22 07/09/2021 0537   CO2 26 12/31/2013 1020   GLUCOSE 90 07/09/2021 0537   GLUCOSE 243 (H) 12/31/2013 1020   BUN 61 (H) 07/09/2021 0537   BUN 19 (H) 12/31/2013 1020   CREATININE 14.15 (H) 07/09/2021 0537   CREATININE 2.12 (H) 12/31/2013 1020   CALCIUM 8.9 07/09/2021 0537   CALCIUM 8.5 12/31/2013 1020   PROT 8.6 (H) 07/08/2021 0638   PROT 7.4 12/31/2013 1020   ALBUMIN 3.4 (L) 07/08/2021 0638   ALBUMIN 3.1 (L) 12/31/2013 1020   AST 16 07/08/2021 0638   AST 21 12/31/2013 1020   ALT 10 07/08/2021 0638   ALT 26 12/31/2013 1020   ALKPHOS 137 (H) 07/08/2021 0638   ALKPHOS 98 12/31/2013 1020   BILITOT 0.9 07/08/2021 0638    BILITOT 0.4 12/31/2013 1020   GFRNONAA 3 (L) 07/09/2021 0537   GFRNONAA 31 (L) 12/31/2013 1020   GFRAA 5 (L) 06/18/2020 1217   GFRAA 36 (L) 12/31/2013 1020     No results found.     Assessment & Plan:   1. Atherosclerosis of native artery of right lower extremity with rest pain (HCC) Recommend:  The patient has evidence of severe atherosclerotic changes of the right  extremities with rest pain that is associated with preulcerative changes and impending tissue loss of the foot.  This represents a limb threatening ischemia and places the patient at the risk for limb loss.  Patient should undergo angiography of the lower extremities with the hope for intervention for limb salvage.  The risks and benefits as well as the alternative therapies was discussed in detail with the patient.  All questions were answered.  Patient agrees to proceed with angiography.  The patient will follow up with me in the office after the procedure.       2. Primary hypertension Continue antihypertensive medications as already ordered, these medications have been reviewed and there are no changes at this time.   3. 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  She notes that the wound is currently healing but slowly after some dehiscence.  She is receiving good wound care with podiatry.  The patient is advised that if the wound healing stalls or fails to progress we may need to intervene the left lower extremity.  Based on studies in her left lower extremity today she has marginal ability for wound healing Based on her perfusion.    Current Outpatient Medications on File Prior to Visit  Medication Sig Dispense Refill   aspirin EC 81 MG EC tablet Take 1 tablet (81 mg total) by mouth daily. 30 tablet 2   atorvastatin (LIPITOR) 20 MG tablet Take 20 mg by mouth  daily.     calcium acetate (PHOSLO) 667 MG capsule Take 2.0 Capsule Oral three times a day with meal     calcium citrate (CALCITRATE - DOSED IN MG ELEMENTAL CALCIUM) 950 (200 Ca) MG tablet Take 200 mg of elemental calcium by mouth 3 (three) times daily.     Cholecalciferol (VITAMIN D) 50 MCG (2000 UT) CAPS Take by mouth daily.     cilostazol (PLETAL) 100 MG tablet Take 1 tablet (100 mg total) by mouth 2 (two) times daily before a meal. 60 tablet 11   fluticasone (FLONASE) 50 MCG/ACT nasal spray Place into the nose.     gentamicin cream (GARAMYCIN) 0.1 % APPLY TO EXIT SITE DAILY     KLOR-CON M20 20 MEQ tablet Take 20 mEq by mouth daily.     lactulose (CHRONULAC) 10 GM/15ML solution Take 30 mLs (20 g total) by mouth 2 (two) times daily as needed for mild constipation. 900 mL 3   multivitamin (RENA-VIT) TABS tablet Take 1 tablet by mouth at bedtime. 30 tablet 0   mupirocin ointment (BACTROBAN) 2 % Apply 1 application topically 2 (two) times daily. 30 g 2   ondansetron (ZOFRAN) 4 MG tablet Take 1 tablet (4 mg total) by mouth every 6 (six) hours as needed for nausea. 20 tablet 0   povidone-iodine (RA POVIDINE IODINE) 10 % ointment Apply 1 application topically as needed for wound care. 30 g 0   sodium thiosulfate 25 % injection Inject 12.5 g into the vein once.     torsemide (DEMADEX) 100 MG tablet Take 100 mg by mouth every morning.     vitamin C (VITAMIN C) 500 MG tablet Take 1 tablet (500 mg total) by mouth 2 (two) times daily. 60 tablet 1   Vitamin D, Ergocalciferol, (DRISDOL) 1.25 MG (50000 UNIT) CAPS capsule Take 50,000 Units by mouth once a week.     nitroGLYCERIN (NITROGLYN) 2 % OINT ointment Apply 1 application topically every 12 (twelve) hours. Applied just behind the toes on the left and right foot as needed 60 g 0   oxyCODONE-acetaminophen (PERCOCET/ROXICET) 5-325 MG tablet Take 1 tablet by mouth every 6 (six) hours as needed for severe pain. (Patient not taking: Reported on 07/30/2021) 20  tablet 0   No current facility-administered medications on file prior to visit.    There are no Patient Instructions on file for this visit. No follow-ups on file.   Kris Hartmann, NP

## 2021-07-30 NOTE — H&P (View-Only) (Signed)
Subjective:    Patient ID: [name], female    DOB: 21-May-1986, 35 y.o.   MRN: [id number] No chief complaint on file.   [name] is a 35 year old female that is well-known to our practice.  The patient's mother contacted our practice notes that she had some worsening discoloration on her toes of her right foot.  The patient has had previous angiograms on her bilateral lower extremities.  The patient is a 67 feels cold Schnier mid calf area down to her foot.  Despite the cold patient does not have any pain.  She does have some persistent numbness.  The patient also previously had amputations of the left toes except for the great toe.  She notes that the wound is mostly healed however there is an area of dehiscence that is having heal from the inside out.  Today noninvasive studies show monophasic waveforms in the right lower extremity from the distal common femoral artery down to the distal popliteal.  Beginning in the anterior tibial artery there is no tibial artery waveforms visualized.  The left lower extremity has monophasic waveforms from the common femoral artery down to the distal posterior tibial artery.  There is an occlusion noted in the left peroneal artery but there are monophasic waveforms in the distal anterior tibial and posterior tibial arteries.  ABIs were noted to be 0 on the right the left ABIs were technically difficult due to bandaging and not reliable.   Review of Systems  Skin:  Positive for color change and wound.  All other systems reviewed and are negative.     Objective:   Physical Exam Vitals reviewed.  HENT:     Head: Normocephalic.  Cardiovascular:     Rate and Rhythm: Normal rate.     Pulses:          Dorsalis pedis pulses are 0 on the right side and detected w/ Doppler on the left side.       Posterior tibial pulses are 0 on the right side and detected w/ Doppler on the left side.  Pulmonary:     Effort: Pulmonary effort is normal.   Neurological:     Mental Status: She is alert and oriented to person, place, and time.  Psychiatric:        Mood and Affect: Mood normal.        Behavior: Behavior normal.        Thought Content: Thought content normal.        Judgment: Judgment normal.    BP (!) 168/113 (BP Location: Right Arm)   Pulse 96   Resp 16   Wt 294 lb (133.4 kg)   BMI 47.45 kg/m   Past Medical History:  Diagnosis Date   Anemia of chronic disease    Asthma    Calciphylaxis 2020   CHF (congestive heart failure) (HCC)    CKD (chronic kidney disease), stage III (HCC)    Diabetes ([place])    type 2   Hypertension    Morbid obesity (HCC)     Social History   Socioeconomic History   Marital status: Single    Spouse name: Not on file   Number of children: 0   Years of education: Not on file   Highest education level: Not on file  Occupational History   Occupation: works at [employer] 6   Tobacco Use   Smoking status: Former    Types: Cigars   Smokeless tobacco: Never  Media planner  Vaping Use: Never used  Substance and Sexual Activity   Alcohol use: No    Alcohol/week: 0.0 standard drinks   Drug use: No   Sexual activity: Yes    Birth control/protection: None  Other Topics Concern   Not on file  Social History Narrative   Lives with mother    Social Determinants of Health   Financial Resource Strain: Not on file  Food Insecurity: Not on file  Transportation Needs: Not on file  Physical Activity: Not on file  Stress: Not on file  Social Connections: Not on file  Intimate Partner Violence: Not on file    Past Surgical History:  Procedure Laterality Date   A/V FISTULAGRAM Left 10/29/2020   Procedure: A/V FISTULAGRAM;  Surgeon: Algernon Huxley, MD;  Location: [facility];  Service: Cardiovascular;  Laterality: Left;   APPLICATION OF WOUND VAC Bilateral 09/15/2019   Procedure: APPLICATION OF WOUND VAC;  Surgeon: Benjamine Sprague, DO;  Location: [facility];  Service: General;  Laterality:  Bilateral;   AV FISTULA PLACEMENT Left 07/09/2020   Procedure: ARTERIOVENOUS (AV) FISTULA CREATION ( BRACHIAL CEPHALIC);  Surgeon: Algernon Huxley, MD;  Location: [facility];  Service: Vascular;  Laterality: Left;   DIALYSIS/PERMA CATHETER INSERTION N/A 02/28/2019   Procedure: DIALYSIS/PERMA CATHETER INSERTION;  Surgeon: Algernon Huxley, MD;  Location: [facility];  Service: Cardiovascular;  Laterality: N/A;   DIALYSIS/PERMA CATHETER INSERTION N/A 09/17/2019   Procedure: DIALYSIS/PERMA CATHETER EXCHANGE;  Surgeon: Katha Cabal, MD;  Location: [facility];  Service: Cardiovascular;  Laterality: N/A;   DIALYSIS/PERMA CATHETER REMOVAL N/A 03/18/2020   Procedure: DIALYSIS/PERMA CATHETER REMOVAL;  Surgeon: Algernon Huxley, MD;  Location: [place] [facility];  Service: Cardiovascular;  Laterality: N/A;   LIGATION OF ARTERIOVENOUS  FISTULA Left 06/03/2021   Procedure: LIGATION OF ARTERIOVENOUS  FISTULA;  Surgeon: Algernon Huxley, MD;  Location: [facility];  Service: Vascular;  Laterality: Left;   LOWER EXTREMITY ANGIOGRAPHY Left 12/02/2020   Procedure: LOWER EXTREMITY ANGIOGRAPHY;  Surgeon: Algernon Huxley, MD;  Location: [place] [facility];  Service: Cardiovascular;  Laterality: Left;   LOWER EXTREMITY ANGIOGRAPHY Right 01/25/2021   Procedure: LOWER EXTREMITY ANGIOGRAPHY;  Surgeon: Algernon Huxley, MD;  Location: [place] [facility];  Service: Cardiovascular;  Laterality: Right;   LOWER EXTREMITY ANGIOGRAPHY Left 04/15/2021   Procedure: LOWER EXTREMITY ANGIOGRAPHY;  Surgeon: Algernon Huxley, MD;  Location: [place] [facility];  Service: Cardiovascular;  Laterality: Left;   LOWER EXTREMITY ANGIOGRAPHY Right 05/20/2021   Procedure: LOWER EXTREMITY ANGIOGRAPHY;  Surgeon: Algernon Huxley, MD;  Location: [facility];  Service: Cardiovascular;  Laterality: Right;   LOWER EXTREMITY ANGIOGRAPHY Left 05/27/2021   Procedure: LOWER EXTREMITY ANGIOGRAPHY;  Surgeon: Algernon Huxley, MD;  Location: [place] [facility] [facility];   Service: Cardiovascular;  Laterality: Left;   pd cath  05/21/2019   TONSILLECTOMY     UPPER EXTREMITY ANGIOGRAPHY Left 04/15/2021   Procedure: UPPER EXTREMITY ANGIOGRAPHY;  Surgeon: Algernon Huxley, MD;  Location: [facility];  Service: Cardiovascular;  Laterality: Left;   WOUND DEBRIDEMENT Bilateral 09/15/2019   Procedure: DEBRIDEMENT WOUND;  Surgeon: Benjamine Sprague, DO;  Location: [facility];  Service: General;  Laterality: Bilateral;    Family History  Problem Relation Age of Onset   Hypertension Mother    Multiple sclerosis Mother    CAD Mother    Cancer Maternal Grandmother        Ovarian  Cancer Maternal Grandfather        Prostate    Kidney disease Father     Allergies  Allergen Reactions   Tomato Itching   Tomato (Diagnostic) Itching   Gabapentin Other (See Comments)    Extremity tremors    CBC Latest Ref Rng & Units 07/09/2021 07/08/2021 07/07/2021  WBC 4.0 - 10.5 K/uL 18.0(H) 21.9(H) 21.6(H)  Hemoglobin 12.0 - 15.0 g/dL 11.2(L) 11.4(L) 12.2  Hematocrit 36.0 - 46.0 % 33.3(L) 34.6(L) 38.5  Platelets 150 - 400 K/uL 163 173 213      CMP     Component Value Date/Time   NA 130 (L) 07/09/2021 0537   NA 135 (L) 12/31/2013 1020   K 3.0 (L) 07/09/2021 0537   K 3.9 12/31/2013 1020   CL 89 (L) 07/09/2021 0537   CL 105 12/31/2013 1020   CO2 22 07/09/2021 0537   CO2 26 12/31/2013 1020   GLUCOSE 90 07/09/2021 0537   GLUCOSE 243 (H) 12/31/2013 1020   BUN 61 (H) 07/09/2021 0537   BUN 19 (H) 12/31/2013 1020   CREATININE 14.15 (H) 07/09/2021 0537   CREATININE 2.12 (H) 12/31/2013 1020   CALCIUM 8.9 07/09/2021 0537   CALCIUM 8.5 12/31/2013 1020   PROT 8.6 (H) 07/08/2021 0638   PROT 7.4 12/31/2013 1020   ALBUMIN 3.4 (L) 07/08/2021 0638   ALBUMIN 3.1 (L) 12/31/2013 1020   AST 16 07/08/2021 0638   AST 21 12/31/2013 1020   ALT 10 07/08/2021 0638   ALT 26 12/31/2013 1020   ALKPHOS 137 (H) 07/08/2021 0638   ALKPHOS 98 12/31/2013 1020   BILITOT 0.9 07/08/2021 0638    BILITOT 0.4 12/31/2013 1020   GFRNONAA 3 (L) 07/09/2021 0537   GFRNONAA 31 (L) 12/31/2013 1020   GFRAA 5 (L) 06/18/2020 1217   GFRAA 36 (L) 12/31/2013 1020     No results found.     Assessment & Plan:   1. Atherosclerosis of native artery of right lower extremity with rest pain (HCC) Recommend:  The patient has evidence of severe atherosclerotic changes of the right  extremities with rest pain that is associated with preulcerative changes and impending tissue loss of the foot.  This represents a limb threatening ischemia and places the patient at the risk for limb loss.  Patient should undergo angiography of the lower extremities with the hope for intervention for limb salvage.  The risks and benefits as well as the alternative therapies was discussed in detail with the patient.  All questions were answered.  Patient agrees to proceed with angiography.  The patient will follow up with me in the office after the procedure.       2. Primary hypertension Continue antihypertensive medications as already ordered, these medications have been reviewed and there are no changes at this time.   3. 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  She notes that the wound is currently healing but slowly after some dehiscence.  She is receiving good wound care with podiatry.  The patient is advised that if the wound healing stalls or fails to progress we may need to intervene the left lower extremity.  Based on studies in her left lower extremity today she has marginal ability for wound healing Based on her perfusion.    Current Outpatient Medications on File Prior to Visit  Medication Sig Dispense Refill   aspirin EC 81 MG EC tablet Take 1 tablet (81 mg total) by mouth daily. 30 tablet 2   atorvastatin (LIPITOR) 20 MG tablet Take 20 mg by mouth  daily.     calcium acetate (PHOSLO) 667 MG capsule Take 2.0 Capsule Oral three times a day with meal     calcium citrate (CALCITRATE - DOSED IN MG ELEMENTAL CALCIUM) 950 (200 Ca) MG tablet Take 200 mg of elemental calcium by mouth 3 (three) times daily.     Cholecalciferol (VITAMIN D) 50 MCG (2000 UT) CAPS Take by mouth daily.     cilostazol (PLETAL) 100 MG tablet Take 1 tablet (100 mg total) by mouth 2 (two) times daily before a meal. 60 tablet 11   fluticasone (FLONASE) 50 MCG/ACT nasal spray Place into the nose.     gentamicin cream (GARAMYCIN) 0.1 % APPLY TO EXIT SITE DAILY     KLOR-CON M20 20 MEQ tablet Take 20 mEq by mouth daily.     lactulose (CHRONULAC) 10 GM/15ML solution Take 30 mLs (20 g total) by mouth 2 (two) times daily as needed for mild constipation. 900 mL 3   multivitamin (RENA-VIT) TABS tablet Take 1 tablet by mouth at bedtime. 30 tablet 0   mupirocin ointment (BACTROBAN) 2 % Apply 1 application topically 2 (two) times daily. 30 g 2   ondansetron (ZOFRAN) 4 MG tablet Take 1 tablet (4 mg total) by mouth every 6 (six) hours as needed for nausea. 20 tablet 0   povidone-iodine (RA POVIDINE IODINE) 10 % ointment Apply 1 application topically as needed for wound care. 30 g 0   sodium thiosulfate 25 % injection Inject 12.5 g into the vein once.     torsemide (DEMADEX) 100 MG tablet Take 100 mg by mouth every morning.     vitamin C (VITAMIN C) 500 MG tablet Take 1 tablet (500 mg total) by mouth 2 (two) times daily. 60 tablet 1   Vitamin D, Ergocalciferol, (DRISDOL) 1.25 MG (50000 UNIT) CAPS capsule Take 50,000 Units by mouth once a week.     nitroGLYCERIN (NITROGLYN) 2 % OINT ointment Apply 1 application topically every 12 (twelve) hours. Applied just behind the toes on the left and right foot as needed 60 g 0   oxyCODONE-acetaminophen (PERCOCET/ROXICET) 5-325 MG tablet Take 1 tablet by mouth every 6 (six) hours as needed for severe pain. (Patient not taking: Reported on 07/30/2021) 20  tablet 0   No current facility-administered medications on file prior to visit.    There are no Patient Instructions on file for this visit. No follow-ups on file.   Kris Hartmann, NP

## 2021-07-31 ENCOUNTER — Encounter (INDEPENDENT_AMBULATORY_CARE_PROVIDER_SITE_OTHER): Payer: Self-pay | Admitting: Nurse Practitioner

## 2021-08-02 ENCOUNTER — Telehealth (INDEPENDENT_AMBULATORY_CARE_PROVIDER_SITE_OTHER): Payer: Self-pay

## 2021-08-02 NOTE — Telephone Encounter (Signed)
Complete

## 2021-08-02 NOTE — Telephone Encounter (Signed)
Patient is schedule for right lower extremity with Dr [name] on 08/04/21 arrival time 9:30 am at the [facility]. Pre-procedure instructions were gone over with patient by phone

## 2021-08-04 ENCOUNTER — Ambulatory Visit
Admission: RE | Admit: 2021-08-04 | Discharge: 2021-08-04 | Disposition: A | Discharge status: Home / Self Care | Payer: Medicare Other | Attending: Vascular Surgery | Admitting: Vascular Surgery

## 2021-08-04 ENCOUNTER — Encounter
Admission: RE | Disposition: A | Discharge status: Home / Self Care | Payer: Self-pay | Source: Home / Self Care | Attending: Vascular Surgery

## 2021-08-04 ENCOUNTER — Other Ambulatory Visit (INDEPENDENT_AMBULATORY_CARE_PROVIDER_SITE_OTHER): Payer: Self-pay | Admitting: Nurse Practitioner

## 2021-08-04 ENCOUNTER — Other Ambulatory Visit: Payer: Self-pay

## 2021-08-04 ENCOUNTER — Encounter: Payer: Self-pay | Admitting: Vascular Surgery

## 2021-08-04 DIAGNOSIS — Z7982 Long term (current) use of aspirin: Secondary | ICD-10-CM | POA: Diagnosis not present

## 2021-08-04 DIAGNOSIS — I509 Heart failure, unspecified: Secondary | ICD-10-CM | POA: Diagnosis not present

## 2021-08-04 DIAGNOSIS — I70261 Atherosclerosis of native arteries of extremities with gangrene, right leg: Secondary | ICD-10-CM | POA: Insufficient documentation

## 2021-08-04 DIAGNOSIS — N183 Chronic kidney disease, stage 3 unspecified: Secondary | ICD-10-CM | POA: Insufficient documentation

## 2021-08-04 DIAGNOSIS — I70229 Atherosclerosis of native arteries of extremities with rest pain, unspecified extremity: Secondary | ICD-10-CM

## 2021-08-04 DIAGNOSIS — Z888 Allergy status to other drugs, medicaments and biological substances status: Secondary | ICD-10-CM | POA: Diagnosis not present

## 2021-08-04 DIAGNOSIS — I70263 Atherosclerosis of native arteries of extremities with gangrene, bilateral legs: Secondary | ICD-10-CM

## 2021-08-04 DIAGNOSIS — L97529 Non-pressure chronic ulcer of other part of left foot with unspecified severity: Secondary | ICD-10-CM | POA: Diagnosis not present

## 2021-08-04 DIAGNOSIS — I70221 Atherosclerosis of native arteries of extremities with rest pain, right leg: Secondary | ICD-10-CM

## 2021-08-04 DIAGNOSIS — Z91018 Allergy to other foods: Secondary | ICD-10-CM | POA: Diagnosis not present

## 2021-08-04 DIAGNOSIS — E1152 Type 2 diabetes mellitus with diabetic peripheral angiopathy with gangrene: Secondary | ICD-10-CM | POA: Insufficient documentation

## 2021-08-04 DIAGNOSIS — Z87891 Personal history of nicotine dependence: Secondary | ICD-10-CM | POA: Diagnosis not present

## 2021-08-04 DIAGNOSIS — Z79899 Other long term (current) drug therapy: Secondary | ICD-10-CM | POA: Diagnosis not present

## 2021-08-04 DIAGNOSIS — E11621 Type 2 diabetes mellitus with foot ulcer: Secondary | ICD-10-CM | POA: Insufficient documentation

## 2021-08-04 DIAGNOSIS — I130 Hypertensive heart and chronic kidney disease with heart failure and stage 1 through stage 4 chronic kidney disease, or unspecified chronic kidney disease: Secondary | ICD-10-CM | POA: Diagnosis not present

## 2021-08-04 DIAGNOSIS — I70245 Atherosclerosis of native arteries of left leg with ulceration of other part of foot: Secondary | ICD-10-CM | POA: Diagnosis not present

## 2021-08-04 DIAGNOSIS — E1122 Type 2 diabetes mellitus with diabetic chronic kidney disease: Secondary | ICD-10-CM | POA: Insufficient documentation

## 2021-08-04 HISTORY — PX: LOWER EXTREMITY ANGIOGRAPHY: CATH118251

## 2021-08-04 LAB — GLUCOSE, CAPILLARY
Glucose-Capillary: 97 mg/dL (ref 70–99)
Glucose-Capillary: 86 mg/dL (ref 70–99)

## 2021-08-04 SURGERY — LOWER EXTREMITY ANGIOGRAPHY
Anesthesia: Moderate Sedation | Site: Leg Lower | Laterality: Right

## 2021-08-04 MED ORDER — MIDAZOLAM HCL 2 MG/2ML IJ SOLN
INTRAMUSCULAR | Status: DC | PRN
  Administered 2021-08-04: 1 mg via INTRAVENOUS
  Administered 2021-08-04: 2 mg via INTRAVENOUS
  Administered 2021-08-04 (×2): 1 mg via INTRAVENOUS

## 2021-08-04 MED ORDER — NITROGLYCERIN 1 MG/10 ML FOR IR/CATH LAB
INTRA_ARTERIAL | Status: DC | PRN
  Administered 2021-08-04: 400 ug via INTRA_ARTERIAL

## 2021-08-04 MED ORDER — SODIUM CHLORIDE 0.9 % IV SOLN: INTRAVENOUS | Status: DC

## 2021-08-04 MED ORDER — HEPARIN SODIUM (PORCINE) 1000 UNIT/ML IJ SOLN
INTRAMUSCULAR | Status: DC | PRN
  Administered 2021-08-04: 6000 [IU] via INTRAVENOUS

## 2021-08-04 MED ORDER — MIDAZOLAM HCL 2 MG/2ML IJ SOLN
INTRAMUSCULAR | Status: AC
  Filled 2021-08-04: qty 2

## 2021-08-04 MED ORDER — CLOPIDOGREL BISULFATE 75 MG PO TABS
ORAL_TABLET | ORAL | Status: AC
  Administered 2021-08-04: 75 mg via ORAL
  Filled 2021-08-04: qty 1

## 2021-08-04 MED ORDER — DIPHENHYDRAMINE HCL 50 MG/ML IJ SOLN: 50.0000 mg | Freq: Once | INTRAMUSCULAR | Status: DC | PRN

## 2021-08-04 MED ORDER — FENTANYL CITRATE PF 50 MCG/ML IJ SOSY
PREFILLED_SYRINGE | INTRAMUSCULAR | Status: AC
  Filled 2021-08-04: qty 1

## 2021-08-04 MED ORDER — FENTANYL CITRATE (PF) 100 MCG/2ML IJ SOLN
INTRAMUSCULAR | Status: DC | PRN
  Administered 2021-08-04 (×2): 25 ug via INTRAVENOUS
  Administered 2021-08-04: 50 ug via INTRAVENOUS
  Administered 2021-08-04: 25 ug via INTRAVENOUS

## 2021-08-04 MED ORDER — FENTANYL CITRATE (PF) 100 MCG/2ML IJ SOLN
INTRAMUSCULAR | Status: AC
  Filled 2021-08-04: qty 2

## 2021-08-04 MED ORDER — FAMOTIDINE 20 MG PO TABS: 40.0000 mg | ORAL_TABLET | Freq: Once | ORAL | Status: DC | PRN

## 2021-08-04 MED ORDER — ONDANSETRON HCL 4 MG/2ML IJ SOLN: 4.0000 mg | Freq: Four times a day (QID) | INTRAMUSCULAR | Status: DC | PRN

## 2021-08-04 MED ORDER — METHYLPREDNISOLONE SODIUM SUCC 125 MG IJ SOLR: 125.0000 mg | Freq: Once | INTRAMUSCULAR | Status: DC | PRN

## 2021-08-04 MED ORDER — CLOPIDOGREL BISULFATE 75 MG PO TABS: 75.0000 mg | ORAL_TABLET | Freq: Every day | ORAL | Status: DC

## 2021-08-04 MED ORDER — MIDAZOLAM HCL 2 MG/ML PO SYRP: 8.0000 mg | ORAL_SOLUTION | Freq: Once | ORAL | Status: DC | PRN

## 2021-08-04 MED ORDER — HYDROMORPHONE HCL 1 MG/ML IJ SOLN: 1.0000 mg | Freq: Once | INTRAMUSCULAR | Status: DC | PRN

## 2021-08-04 MED ORDER — NITROGLYCERIN 1 MG/10 ML FOR IR/CATH LAB
INTRA_ARTERIAL | Status: AC
  Filled 2021-08-04: qty 10

## 2021-08-04 MED ORDER — HEPARIN SODIUM (PORCINE) 1000 UNIT/ML IJ SOLN
INTRAMUSCULAR | Status: AC
  Filled 2021-08-04: qty 1

## 2021-08-04 MED ORDER — CEFAZOLIN SODIUM-DEXTROSE 1-4 GM/50ML-% IV SOLN
1.0000 g | Freq: Once | INTRAVENOUS | Status: AC
  Administered 2021-08-04: 1 g via INTRAVENOUS

## 2021-08-04 SURGICAL SUPPLY — 36 items
BALLN ARMADA 2.0X200X150 (BALLOONS) ×2
BALLN ARMADA 2X200X150 (BALLOONS) ×2
BALLN ARMADA 3.0X100X150 (BALLOONS) ×2
BALLN ARMADA 3.0X200X150 (BALLOONS) ×2
BALLN LUTONIX 018 5X60X130 (BALLOONS) ×2
BALLN ULTRVRSE 3X300X150 (BALLOONS) ×2
BALLOON ARMADA 2.0X200X150 (BALLOONS) ×1 IMPLANT
BALLOON ARMADA 2X200X150 (BALLOONS) ×1 IMPLANT
BALLOON ARMADA 3.0X100X150 (BALLOONS) ×1 IMPLANT
BALLOON ARMADA 3.0X200X150 (BALLOONS) ×1 IMPLANT
BALLOON LUTONIX 018 5X60X130 (BALLOONS) ×1 IMPLANT
BALLOON ULTRVRSE 3X300X150 (BALLOONS) ×1 IMPLANT
CANISTER PENUMBRA ENGINE (MISCELLANEOUS) ×2 IMPLANT
CATH 0.018 NAVICROSS ANG 135 (CATHETERS) ×2 IMPLANT
CATH ANGIO 5F PIGTAIL 65CM (CATHETERS) ×2 IMPLANT
CATH INDIGO CAT6 KIT (CATHETERS) ×2 IMPLANT
CATH NAVICROSS ANGLED 135CM (MICROCATHETER) ×2 IMPLANT
CATH SEEKER .035X150CM (CATHETERS) ×2 IMPLANT
CATH TEMPO 5F RIM 65CM (CATHETERS) ×2 IMPLANT
CATH VERT 5FR 125CM (CATHETERS) ×2 IMPLANT
CATH VERT 5X100 (CATHETERS) ×2 IMPLANT
COVER PROBE U/S 5X48 (MISCELLANEOUS) ×2 IMPLANT
DEVICE STARCLOSE SE CLOSURE (Vascular Products) ×2 IMPLANT
DEVICE TORQUE .025-.038 (MISCELLANEOUS) ×2 IMPLANT
GLIDEWIRE ADV .014X300CM (WIRE) ×4 IMPLANT
GLIDEWIRE ADV .035X260CM (WIRE) ×2 IMPLANT
GLIDEWIRE ANGLED SS 035X260CM (WIRE) ×2 IMPLANT
GUIDEWIRE PFTE-COATED .018X300 (WIRE) ×2 IMPLANT
KIT ENCORE 26 ADVANTAGE (KITS) ×2 IMPLANT
PACK ANGIOGRAPHY (CUSTOM PROCEDURE TRAY) ×2 IMPLANT
SHEATH BRITE TIP 5FRX11 (SHEATH) ×2 IMPLANT
SHEATH PINNACLE ST 6F 45CM (SHEATH) ×2 IMPLANT
SHEATH RAABE 6FRX70 (SHEATH) ×2 IMPLANT
TUBING CONTRAST HIGH PRESS 72 (TUBING) ×2 IMPLANT
WIRE G V18X300CM (WIRE) ×2 IMPLANT
WIRE GUIDERIGHT .035X150 (WIRE) ×2 IMPLANT

## 2021-08-04 NOTE — Op Note (Signed)
[facility]  Percutaneous Study/Intervention Procedural Note   Date of Surgery: 08/04/2021  Surgeon(s): [name], MD  Assistants: Elmore Guise, MD  Pre-operative Diagnosis: PAD with gangrenous changes bilateral lower extremities  Post-operative diagnosis:  Same  Procedure(s) Performed:             1.  Ultrasound guidance for vascular access left femoral artery             2.  Catheter placement into right femoral artery from left femoral approach             3.  Aortogram and selective right lower extremity angiogram             4.  Mechanical thrombectomy of the right distal SFA and of the tibioperoneal trunk with the penumbra CAT 6 device             5.  Percutaneous transluminal angioplasty of the right tibioperoneal trunk and proximal peroneal artery with 3 mm diameter angioplasty balloon  6.  Percutaneous transluminal angioplasty of the right anterior tibial artery with 2 mm diameter angioplasty balloon proximally  7.  Percutaneous transluminal angioplasty of the right posterior tibial artery with 2 mm diameter angioplasty balloon into the foot and then multiple inflations with a 3 mm diameter angioplasty balloon from just above the ankle up to the tibioperoneal trunk  8.  Primary stent placement to the right distal SFA for the thrombotic lesion with 6 mm diameter by 5 cm length stent             9.  StarClose closure device left femoral artery  EBL: 100 cc  Contrast: 50 cc  Fluoro Time: 18.2 minutes  Moderate Conscious Sedation Time: approximately 79 minutes using 5 mg of Versed and 125 mcg of Fentanyl              Indications:  Patient is a 35 y.o.female with severe peripheral arterial disease and gangrenous changes to her right toes after multiple previous interventions bilaterally. The patient has noninvasive study showing flat digital pressures on the right. The patient is brought in for angiography for further evaluation and potential treatment.   Due to the limb threatening nature of the situation, angiogram was performed for attempted limb salvage. The patient is aware that if the procedure fails, amputation would be expected.  The patient also understands that even with successful revascularization, amputation may still be required due to the severity of the situation.  Risks and benefits are discussed and informed consent is obtained.   Procedure:  The patient was identified and appropriate procedural time out was performed.  The patient was then placed supine on the table and prepped and draped in the usual sterile fashion. Moderate conscious sedation was administered during a face to face encounter with the patient throughout the procedure with my supervision of the RN administering medicines and monitoring the patient's vital signs, pulse oximetry, telemetry and mental status throughout from the start of the procedure until the patient was taken to the recovery room. Ultrasound was used to evaluate the left common femoral artery.  It was patent .  A digital ultrasound image was acquired.  A Seldinger needle was used to access the left common femoral artery under direct ultrasound guidance and a permanent image was performed.  A 0.035 J wire was advanced without resistance and a 5Fr sheath was placed.  Pigtail catheter was placed into the aorta and an AP aortogram was performed. This demonstrated  normal renal arteries and normal aorta and iliac segments without significant stenosis. I then crossed the aortic bifurcation and advanced to the right femoral head. Selective right lower extremity angiogram was then performed. This demonstrated fairly normal common femoral artery, proximal and mid superficial femoral artery with the lesion in the distal superficial femoral artery that was highly thrombotic and greater than 80% stenotic.  This was relatively short segment and then the vessel normalized in the popliteal artery was fairly normal.  There is a  typical tibial trifurcation with severe tibial disease.  There was clear thrombotic material in the tibioperoneal trunk and the proximal portions of the peroneal and posterior tibial arteries with chronically diseased peroneal and posterior tibial arteries with multiple segments of high-grade stenosis or occlusion.  The anterior tibial artery was very small and heavily diseased with sluggish flow into the foot.  The posterior tibial artery was the best flow in the foot but was still very diseased. It was felt that it was in the patient's best interest to proceed with intervention after these images to avoid a second procedure and a larger amount of contrast and fluoroscopy based off of the findings from the initial angiogram. The patient was systemically heparinized and a 6 French 70 cm sheath was then placed over the Terumo Advantage wire. I then used a Kumpe catheter and the advantage wire to navigate through the SFA lesion and then through the tibioperoneal trunk occlusion and initially into the peroneal artery exchanging for a V 18 wire.  The penumbra CAT 6 device was then brought on the field and mechanical thrombectomy was performed both in the distal SFA and in the tibioperoneal trunk with minimal improvement.  We then proceeded with angioplasty of the tibioperoneal trunk and the proximal portion of the peroneal artery with a 3 mm diameter by 10 cm length angioplasty balloon inflated to 8 atm for 1 minute.  Completion imaging showed flow now through the tibioperoneal trunk but the peroneal artery was heavily diseased with poor runoff distally.  We felt we had a better chance it improving the circulation to her foot through either the anterior tibial artery or the posterior tibial arteries.  We then used the Kumpe catheter and a 0.018 advantage wire and gained access to the anterior tibial artery and got a wire into the distal anterior tibial artery but the catheter even after exchanging for microcatheters  would not track beyond the first 8 to 10 cm of the anterior tibial artery.  We then took a 2 mm diameter angioplasty balloon and took it down into the proximal portion of the anterior tibial artery and it would not cross the first 8 to 10 cm of the anterior tibial artery we inflated in that location in hopes of getting further purchase distally.  This was taken to 14 atm.  Following this, that portion of the anterior tibial artery was now patent, but we still could not get catheters to track beyond the proximal calcific lesion.  After it was clear that this was not going to provide good flow to the foot, we turned our attention of the posterior tibial artery.  Tediously, we exchanged with the Ferd Hibbs cross catheter and initially a 0.018 advantage wire and ultimately a 0.014 advantage wire we were able to get across the diseased posterior tibial artery and get a wire all the way into the foot.  We then used a seeker catheter to confirm intraluminal flow in the foot and there was severe small vessel  disease in the foot, but we replaced a 0.014 wire and proceeded with treatment.  A 2 mm diameter by 20 cm length angioplasty balloon was then inflated in the posterior tibial artery all the way into the foot and this was taken up to the distal lower leg and inflated to 10 atm for 1 minute.  We then used a 3 mm diameter angioplasty balloon in the posterior tibial artery from just above the ankle up to the tibioperoneal trunk.  The initial inflation with a 3 mm diameter angioplasty balloon actually burst an additional 3 mm diameter angioplasty balloon was inflated to 10 atm and held for 1 minute.  Completion imaging now showed less than 20% residual stenosis in the posterior tibial artery into the foot again with a small vessel disease in the foot that was expected.  The tibioperoneal trunk was now widely patent.  For the thrombotic lesion of the distal SFA, there had been minimal improvement with thrombectomy and so I elected  to primarily stent this.  A 6 mm diameter by 5 cm length Viabahn stent was deployed in the distal SFA and postdilated with a 5 mm diameter by 6 cm length Lutonix drug-coated angioplasty balloon with less than 10% residual stenosis and brisk close distally. I elected to terminate the procedure. The sheath was removed and StarClose closure device was deployed in the left femoral artery with excellent hemostatic result. The patient was taken to the recovery room in stable condition having tolerated the procedure well.  Findings:               Aortogram:  This demonstrated normal renal arteries and normal aorta and iliac segments without significant stenosis.              Right lower Extremity: Fairly normal common femoral artery, proximal and mid superficial femoral artery with the lesion in the distal superficial femoral artery that was highly thrombotic and greater than 80% stenotic.  This was relatively short segment and then the vessel normalized in the popliteal artery was fairly normal.  There is a typical tibial trifurcation with severe tibial disease.  There was clear thrombotic material in the tibioperoneal trunk and the proximal portions of the peroneal and posterior tibial arteries with chronically diseased peroneal and posterior tibial arteries with multiple segments of high-grade stenosis or occlusion.  The anterior tibial artery was very small and heavily diseased with sluggish flow into the foot.  The posterior tibial artery was the best flow in the foot but was still very diseased.   Disposition: Patient was taken to the recovery room in stable condition having tolerated the procedure well.  Complications: None  [name] 08/04/2021 12:36 PM   This note was created with Dragon Medical transcription system. Any errors in dictation are purely unintentional.

## 2021-08-04 NOTE — Interval H&P Note (Signed)
History and Physical Interval Note:  08/04/2021 10:25 AM  [name]  has presented today for surgery, with the diagnosis of RT Leg Angiography   ASO with rest pain   BARD Rep   cc: [name].  The various methods of treatment have been discussed with the patient and family. After consideration of risks, benefits and other options for treatment, the patient has consented to  Procedure(s): LOWER EXTREMITY ANGIOGRAPHY (Right) as a surgical intervention.  The patient's history has been reviewed, patient examined, no change in status, stable for surgery.  I have reviewed the patient's chart and labs.  Questions were answered to the patient's satisfaction.     [name]

## 2021-08-04 NOTE — OR Nursing (Signed)
Pt denies chance of pregnancy

## 2021-08-05 ENCOUNTER — Encounter: Payer: Self-pay | Admitting: Vascular Surgery

## 2021-08-11 ENCOUNTER — Ambulatory Visit (INDEPENDENT_AMBULATORY_CARE_PROVIDER_SITE_OTHER): Payer: Medicare Other | Admitting: Podiatry

## 2021-08-11 ENCOUNTER — Other Ambulatory Visit: Payer: Self-pay

## 2021-08-11 DIAGNOSIS — I739 Peripheral vascular disease, unspecified: Secondary | ICD-10-CM | POA: Diagnosis not present

## 2021-08-11 DIAGNOSIS — L97522 Non-pressure chronic ulcer of other part of left foot with fat layer exposed: Secondary | ICD-10-CM

## 2021-08-11 MED ORDER — SANTYL 250 UNIT/GM EX OINT: 1.0000 "application " | TOPICAL_OINTMENT | Freq: Every day | CUTANEOUS | 0 refills | Status: DC

## 2021-08-12 NOTE — Progress Notes (Signed)
  Subjective:  Patient ID: [name], female    DOB: [birth year],  MRN: [id number]  Chief Complaint  Patient presents with   Foot Ulcer    3 week follow up wound care bilateral feet       35 y.o. female returns for post-op check.    Review of Systems: Negative except as noted in the HPI. Denies N/V/F/Ch.   Objective:  There were no vitals filed for this visit. There is no height or weight on file to calculate BMI. Constitutional Well developed. Well nourished.  Vascular Unchanged vascular status  Neurologic Normal speech. Oriented to person, place, and time. Epicritic sensation to light touch grossly present bilaterally.  Dermatologic Incision on the right side continues to heal there is minor central dehiscence that is superficial and does not require debridement.  The left side has a dehiscence measuring 5.5 x 1.5 x 1.5 cm with exposed subcutaneous tissue and probes deep to the level of the metatarsal head there is fat necrosis draining from the wound  Orthopedic: Tenderness to palpation noted about the surgical site.        Assessment:   1. Ulcer of left foot with fat layer exposed ([place])   2. Peripheral arterial disease ([place])     Plan:  Patient was evaluated and treated and all questions answered.  S/p foot surgery bilaterally -Continue iodine ointment application to the right side  Ulcer left foot secondary to surgical dehiscence -Wound continues to worsen and shows no progress of healing.  We discussed further treatment including transmetatarsal amputation which is what I believe she will ultimately need as this is from poor signs of healing and I do not think at the digital level she has much healing capacity with her blood flow.  She is adamant about further amputation at this point.  I recommend we switch to Santyl collagenase ointment to see if this can improve the characteristics of the wound.  She will return to see me on 1 week.  I took a wound culture  of the drainage to see if she requires further antibiotics.  Return in about 1 week (around 08/18/2021) for wound care.

## 2021-08-18 ENCOUNTER — Other Ambulatory Visit: Payer: Self-pay

## 2021-08-18 ENCOUNTER — Ambulatory Visit (INDEPENDENT_AMBULATORY_CARE_PROVIDER_SITE_OTHER): Payer: Medicare Other | Admitting: Podiatry

## 2021-08-18 DIAGNOSIS — L97524 Non-pressure chronic ulcer of other part of left foot with necrosis of bone: Secondary | ICD-10-CM

## 2021-08-18 DIAGNOSIS — I96 Gangrene, not elsewhere classified: Secondary | ICD-10-CM | POA: Diagnosis not present

## 2021-08-19 LAB — WOUND CULTURE: Organism ID, Bacteria: NONE SEEN

## 2021-08-19 MED ORDER — DOXYCYCLINE HYCLATE 100 MG PO TABS: 100.0000 mg | ORAL_TABLET | Freq: Two times a day (BID) | ORAL | 0 refills | Status: DC

## 2021-08-19 NOTE — Progress Notes (Signed)
  Subjective:  Patient ID: [name], female    DOB: June 20, 1986,  MRN: [id number]  No chief complaint on file.      35 y.o. female returns for post-op check.    Review of Systems: Negative except as noted in the HPI. Denies N/V/F/Ch.   Objective:  There were no vitals filed for this visit. There is no height or weight on file to calculate BMI. Constitutional Well developed. Well nourished.  Vascular Unchanged vascular status  Neurologic Normal speech. Oriented to person, place, and time. Epicritic sensation to light touch grossly present bilaterally.  Dermatologic Incision on the right side continues to heal there is minor central dehiscence that is superficial and does not require debridement.  The left side has a dehiscence measuring 5.5 x 1.5 x 1.5 cm with exposed subcutaneous tissue and probes deep to the level of the metatarsal head there is fat necrosis draining from the wound  Orthopedic: Tenderness to palpation noted about the surgical site.        Assessment:   1. 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 Gangrene of toe of left foot ([place])     Plan:  Patient was evaluated and treated and all questions answered.  S/p foot surgery bilaterally -Continue iodine ointment application to the right side  Ulcer left foot secondary to surgical dehiscence -Again continues to deteriorate. I think a TMA will be best. They are hesitant to proceed with further amputation. I am ordering an MRI to determine the extent of infection to help guide this decision and surgical decision making.   Return in about 1 week (around 08/25/2021) for wound care.

## 2021-08-23 ENCOUNTER — Encounter (INDEPENDENT_AMBULATORY_CARE_PROVIDER_SITE_OTHER): Payer: Medicare Other

## 2021-08-23 ENCOUNTER — Ambulatory Visit (INDEPENDENT_AMBULATORY_CARE_PROVIDER_SITE_OTHER): Payer: Medicare Other | Admitting: Nurse Practitioner

## 2021-08-25 ENCOUNTER — Other Ambulatory Visit: Payer: Self-pay

## 2021-08-25 ENCOUNTER — Ambulatory Visit (INDEPENDENT_AMBULATORY_CARE_PROVIDER_SITE_OTHER): Payer: Medicare Other | Admitting: Podiatry

## 2021-08-25 DIAGNOSIS — L97524 Non-pressure chronic ulcer of other part of left foot with necrosis of bone: Secondary | ICD-10-CM

## 2021-08-27 ENCOUNTER — Other Ambulatory Visit: Payer: Self-pay

## 2021-08-27 ENCOUNTER — Telehealth: Payer: Self-pay | Admitting: *Deleted

## 2021-08-27 ENCOUNTER — Encounter: Payer: Self-pay | Admitting: Hematology and Oncology

## 2021-08-27 ENCOUNTER — Ambulatory Visit
Admission: RE | Admit: 2021-08-27 | Discharge: 2021-08-27 | Disposition: A | Discharge status: Home / Self Care | Payer: Medicare Other | Source: Ambulatory Visit | Attending: Podiatry | Admitting: Podiatry

## 2021-08-27 DIAGNOSIS — R2689 Other abnormalities of gait and mobility: Secondary | ICD-10-CM

## 2021-08-27 DIAGNOSIS — L97524 Non-pressure chronic ulcer of other part of left foot with necrosis of bone: Secondary | ICD-10-CM

## 2021-08-27 DIAGNOSIS — I96 Gangrene, not elsewhere classified: Secondary | ICD-10-CM

## 2021-08-27 IMAGING — MR MR FOOT*L* W/O CM
4 of 5 series · 17 of 40 positions shown · non-contrast
Comparison: Left foot radiograph [DATE]

CLINICAL DATA: Foot swelling, diabetic, osteomyelitis suspected,
xray done

EXAM:
MRI OF THE LEFT FOOT WITHOUT CONTRAST
TECHNIQUE: Multiplanar, multisequence MR imaging of the left forefoot was
performed. No intravenous contrast was administered.

[Series 6: T1 · coronal · 3.0mm · 0.20mm/px · 3 of 44 slices shown (1 of 2)]
[im 5/44]
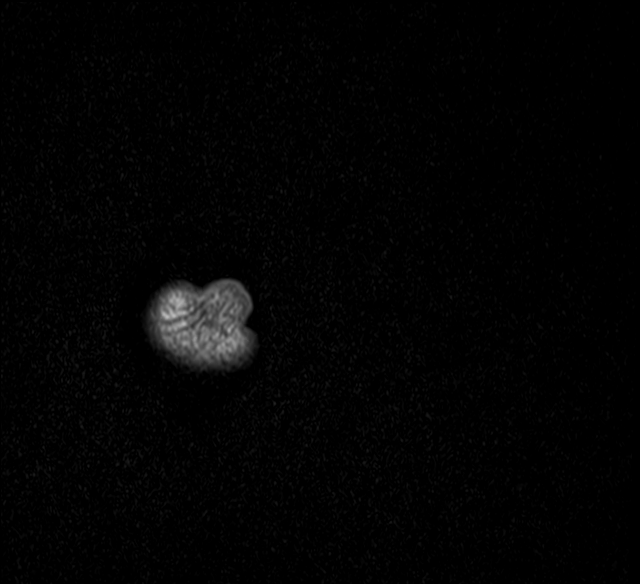
[im 24/44]
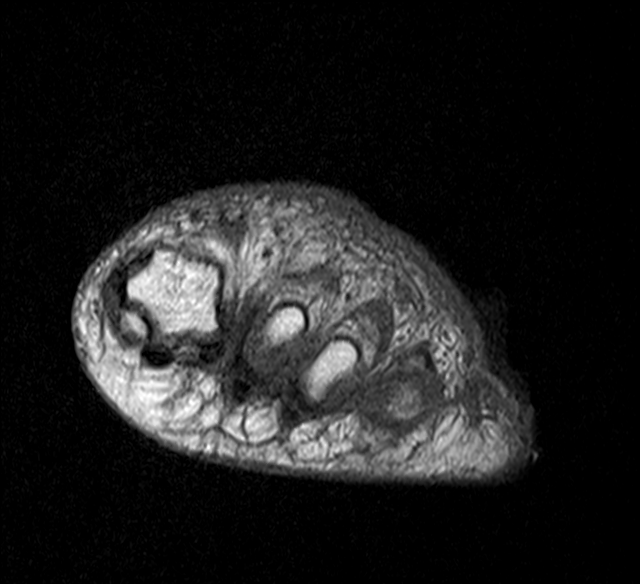
[im 39/44]
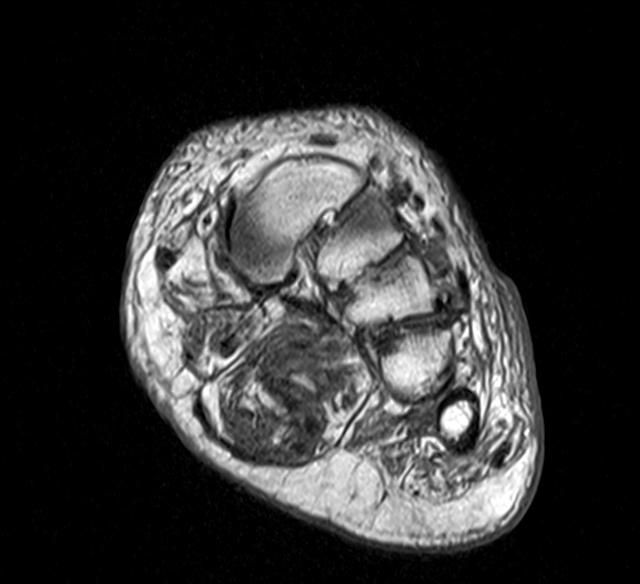

[Series 7: T2 fat-sat · coronal · 3.0mm · 0.20mm/px · 8 of 44 slices shown (1 of 2)]
[im 1/44]
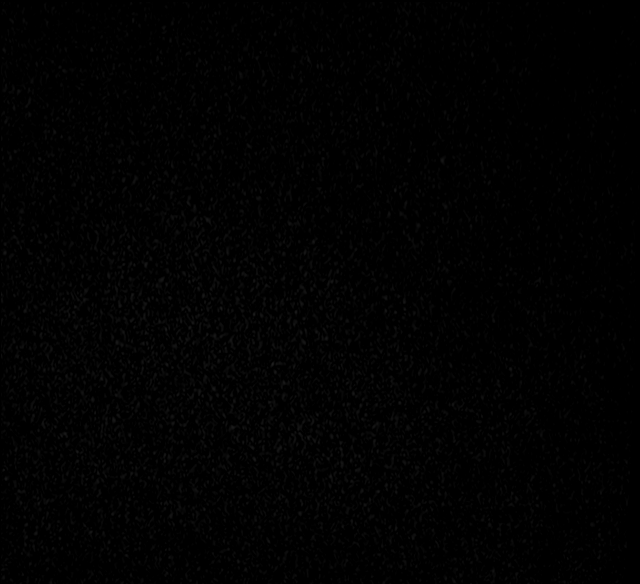
[im 5/44]
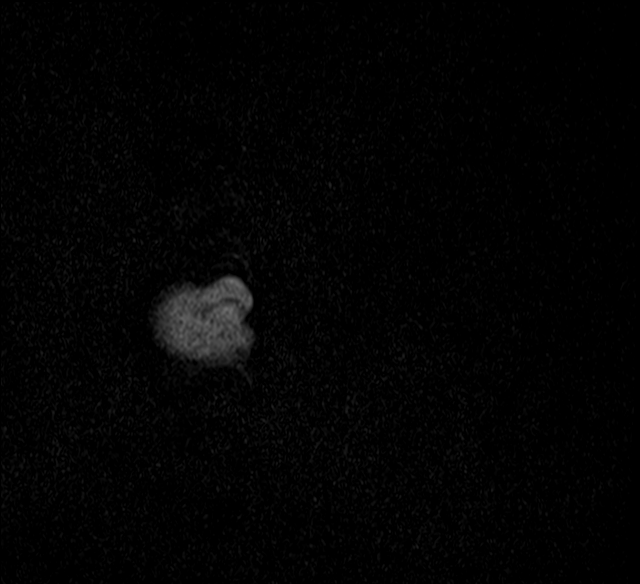
[im 15/44]
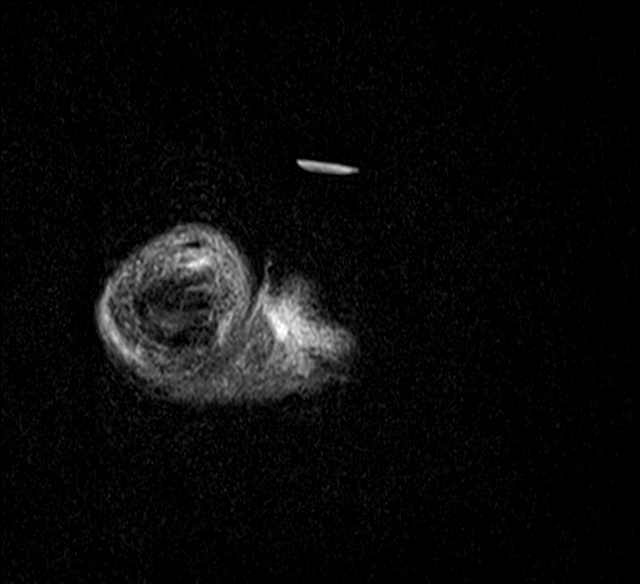
[im 20/44]
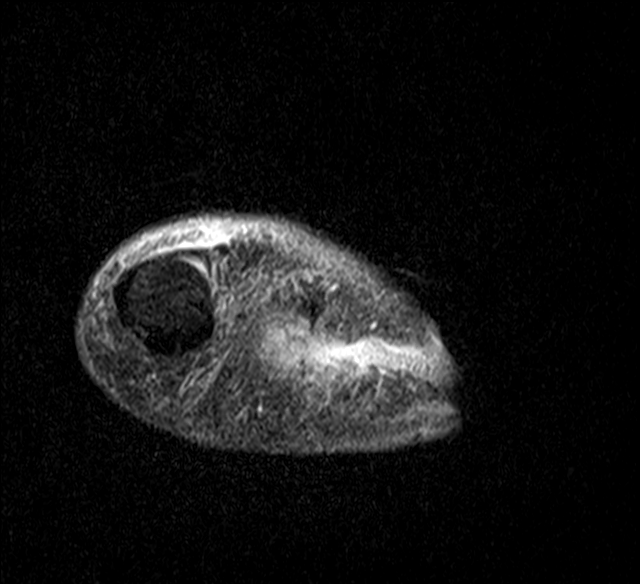
[im 24/44]
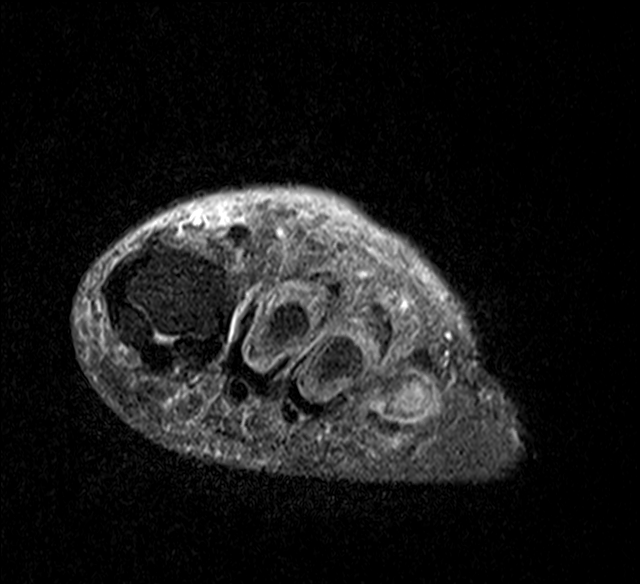
[im 29/44]
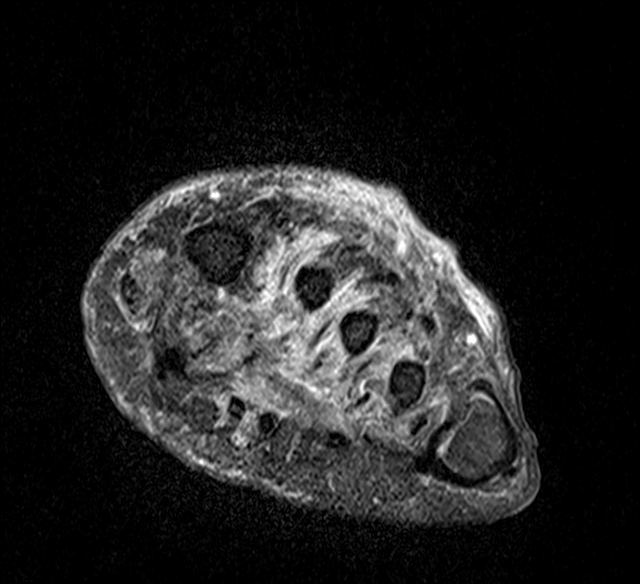
[im 39/44]
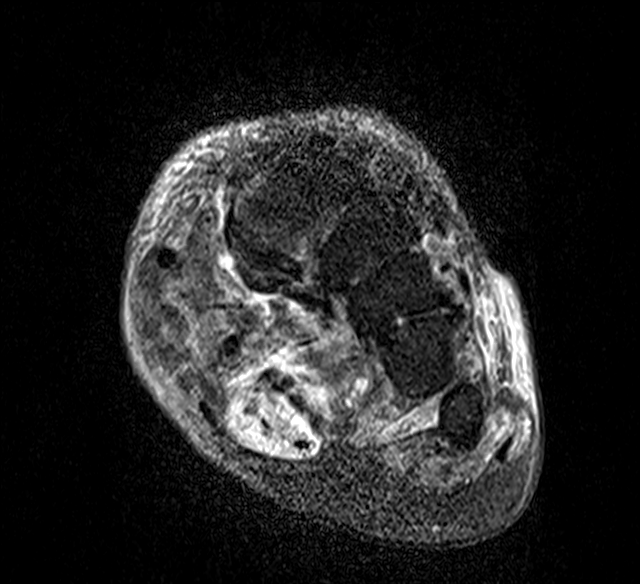
[im 44/44]
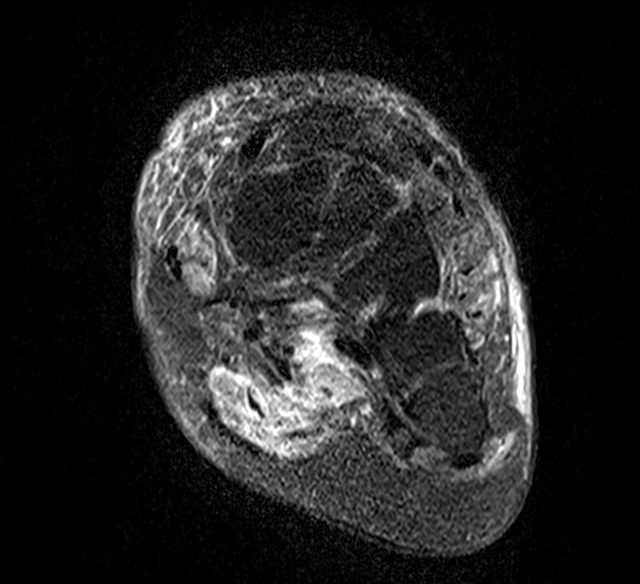

[Series 8: T2 fat-sat · axial · 3.0mm · 0.35mm/px · z∈[-49,+22]mm · 3 of 24 slices shown (2 of 2)]
[im 5/24]
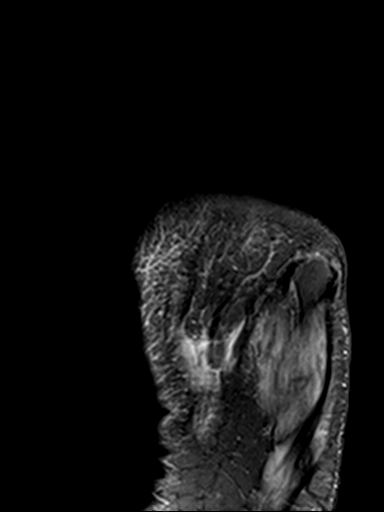
[im 14/24]
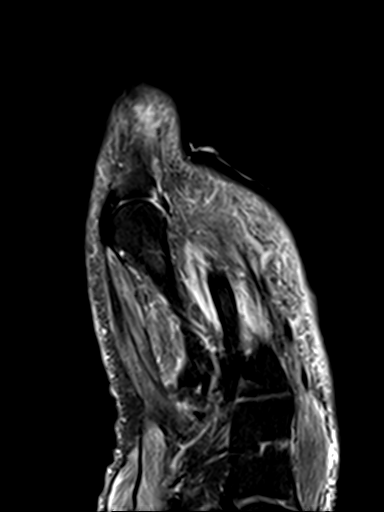
[im 24/24]
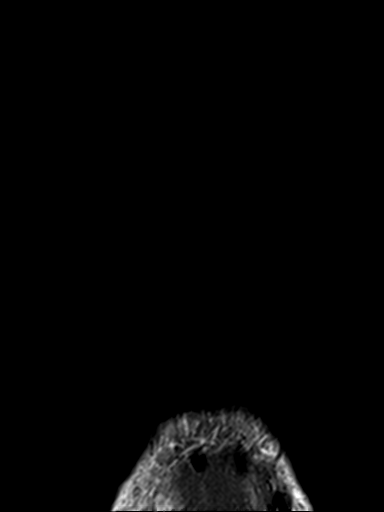

[Series 9: T1 · axial · 3.0mm · 0.35mm/px · z∈[-49,+22]mm · 3 of 24 slices shown (2 of 2)]
[im 5/24]
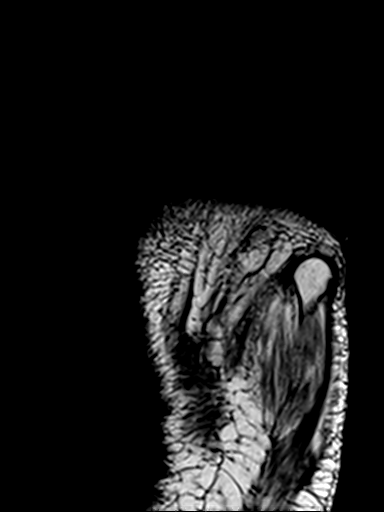
[im 14/24]
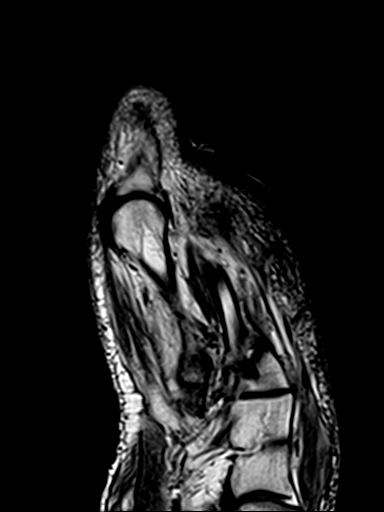
[im 24/24]
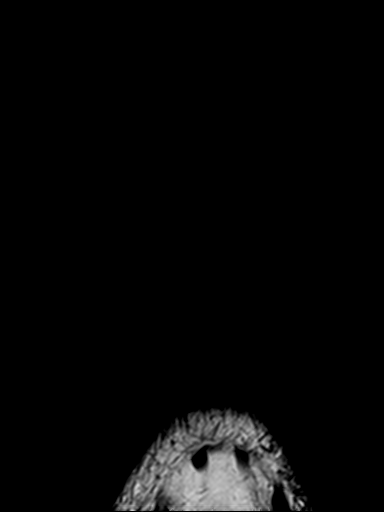

[17 of 40 positions shown; findings below may reference images not displayed]

FINDINGS: Bones/Joint/Cartilage

Postsurgical changes of prior fifth toe amputation and more recent
second third and fourth toe amputations. There is bony edema signal
and some mild low T1 signal within the second third and fourth
metatarsal heads. Preserved T1 signal within the fifth metatarsal
and the first ray.

Ligaments

The great toe ligamentous structures appear intact. The Lisfranc
ligament is intact.

Muscles and Tendons

Diffuse intramuscular edema and atrophy in the foot, as is commonly
seen in diabetics.

Soft tissues

Diffuse soft tissue swelling of the foot. There is a prior forefoot
incision with fluid signal along its course, possibly dehisced. No
evidence of deeper abscess in the midfoot.
IMPRESSION: Postsurgical changes of recent [M6] toe amputation, with signal
abnormality within the residual [M6] metatarsal heads consistent
with osteomyelitis. Overlying forefoot soft tissue defect/possible
wound dehiscence, with fluid signal along its course, correlate with
drainage. No evidence of deeper abscess.

These results will be called to the ordering clinician or
representative by the Radiologist Assistant, and communication
documented in the PACS or [REDACTED].

## 2021-08-27 NOTE — Telephone Encounter (Signed)
Will review thank you

## 2021-08-27 NOTE — Telephone Encounter (Signed)
[facility] is calling to let the doctor know that the MRI report for patient  is in epic to view.

## 2021-08-29 NOTE — Progress Notes (Signed)
  Subjective:  Patient ID: [name], female    DOB: [birth year],  MRN: [id number]  Chief Complaint  Patient presents with   Foot Ulcer    Follow up bilateral feet        35 y.o. female returns for post-op check.    Review of Systems: Negative except as noted in the HPI. Denies N/V/F/Ch.   Objective:  There were no vitals filed for this visit. There is no height or weight on file to calculate BMI. Constitutional Well developed. Well nourished.  Vascular Unchanged vascular status  Neurologic Normal speech. Oriented to person, place, and time. Epicritic sensation to light touch grossly present bilaterally.  Dermatologic Incision on the right side continues to heal there is minor central dehiscence that is superficial and does not require debridement.  The left side has a dehiscence measuring 5.5 x 1.5 x 1.5 cm with exposed subcutaneous tissue and probes deep to the level of the metatarsal head there is fat necrosis draining from the wound  Orthopedic: Tenderness to palpation noted about the surgical site.        Assessment:   1. 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  S/p foot surgery bilaterally -Continue iodine ointment application to the right side  Ulcer left foot secondary to surgical dehiscence -MRI scheduled for this Saturday.  Do not think the wound will heal at this point she likely is facing transmetatarsal amputation.  I will see her back next week for review the MRI  Return in 5 days (on 08/30/2021) for MRI review.

## 2021-08-30 ENCOUNTER — Other Ambulatory Visit: Payer: Self-pay

## 2021-08-30 ENCOUNTER — Ambulatory Visit (INDEPENDENT_AMBULATORY_CARE_PROVIDER_SITE_OTHER): Payer: Medicare Other | Admitting: Podiatry

## 2021-08-30 DIAGNOSIS — Z992 Dependence on renal dialysis: Secondary | ICD-10-CM

## 2021-08-30 DIAGNOSIS — I739 Peripheral vascular disease, unspecified: Secondary | ICD-10-CM

## 2021-08-30 DIAGNOSIS — N186 End stage renal disease: Secondary | ICD-10-CM

## 2021-08-30 DIAGNOSIS — M86472 Chronic osteomyelitis with draining sinus, left ankle and foot: Secondary | ICD-10-CM

## 2021-08-30 DIAGNOSIS — I70229 Atherosclerosis of native arteries of extremities with rest pain, unspecified extremity: Secondary | ICD-10-CM

## 2021-08-31 ENCOUNTER — Other Ambulatory Visit (INDEPENDENT_AMBULATORY_CARE_PROVIDER_SITE_OTHER): Payer: Self-pay | Admitting: Vascular Surgery

## 2021-08-31 DIAGNOSIS — Z9862 Peripheral vascular angioplasty status: Secondary | ICD-10-CM

## 2021-08-31 NOTE — H&P (View-Only) (Signed)
Subjective:  Patient ID: [name], female    DOB: 1986-04-06,  MRN: [id number]  MRI review of left foot    35 y.o. female returns for post-op check.  MRI completed  Review of Systems: Negative except as noted in the HPI. Denies N/V/F/Ch.   Objective:  There were no vitals filed for this visit. There is no height or weight on file to calculate BMI. Constitutional Well developed. Well nourished.  Vascular Unchanged vascular status  Neurologic Normal speech. Oriented to person, place, and time. Epicritic sensation to light touch grossly present bilaterally.  Dermatologic Incision on the right side continues to heal there is minor central dehiscence that is superficial and does not require debridement.  The left side has a dehiscence measuring 5.5 x 1.5 x 1.5 cm with exposed subcutaneous tissue and probes deep to the level of the metatarsal head there is fat necrosis draining from the wound  Orthopedic: Tenderness to palpation noted about the surgical site.   Study Result  Narrative & Impression  CLINICAL DATA:  Foot swelling, diabetic, osteomyelitis suspected, xray done   EXAM: MRI OF THE LEFT FOOT WITHOUT CONTRAST   TECHNIQUE: Multiplanar, multisequence MR imaging of the left forefoot was performed. No intravenous contrast was administered.   COMPARISON:  Left foot radiograph 01/27/2021   FINDINGS: Bones/Joint/Cartilage   Postsurgical changes of prior fifth toe amputation and more recent second third and fourth toe amputations. There is bony edema signal and some mild low T1 signal within the second third and fourth metatarsal heads. Preserved T1 signal within the fifth metatarsal and the first ray.   Ligaments   The great toe ligamentous structures appear intact. The Lisfranc ligament is intact.   Muscles and Tendons   Diffuse intramuscular edema and atrophy in the foot, as is commonly seen in diabetics.   Soft tissues   Diffuse soft tissue  swelling of the foot. There is a prior forefoot incision with fluid signal along its course, possibly dehisced. No evidence of deeper abscess in the midfoot.   IMPRESSION: Postsurgical changes of recent 2nd-4th toe amputation, with signal abnormality within the residual 2nd-4th metatarsal heads consistent with osteomyelitis. Overlying forefoot soft tissue defect/possible wound dehiscence, with fluid signal along its course, correlate with drainage. No evidence of deeper abscess.   These results will be called to the ordering clinician or representative by the Radiologist Assistant, and communication documented in the PACS or [employer].     Electronically Signed   By: Maurine Simmering M.D.   Assessment:   1. 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 Peripheral arterial disease ([place])   3. ESRD (end stage renal disease) on dialysis ([place])   4. Critical lower limb ischemia ([place])       Plan:  Patient was evaluated and treated and all questions answered.  S/p foot surgery bilaterally -I reviewed the findings of the MRI with [name] and [name] [name] in detail.  We also reviewed the images together.  Discussed with them that at this point I think the wound will not heal on its own and there is presence of osteomyelitis in the metatarsal heads.  I am recommending a transmetatarsal amputation.  I think she does not have much choice for it at this point if she wishes to save the remainder of the foot.  Discussed the risk benefits and potential complications.  We also discussed that there are no guarantees of this and that wound healing is still very  much a risk for [name].  Informed sent was signed and reviewed for transmetatarsal amputation and Achilles tendon lengthening of the left foot.  This will be scheduled for next week.  All questions were addressed.   Surgical plan:  Procedure: -Transmetatarsal amputation and Achilles tendon lengthening left  foot  Location: -[facility]  Anesthesia plan: -General with block  Postoperative pain plan: - Tylenol 1000 mg every 6 hours, oxycodone 5 mg 1-2 tabs every 6 hours as needed  DVT prophylaxis: -None required  WB Restrictions / DME needs: -NWB in Body Armor CAM boot which has been dispensed today and I will bring to [name] case, use of knee scooter   No follow-ups on file.

## 2021-08-31 NOTE — Progress Notes (Signed)
Subjective:  Patient ID: [name], female    DOB: 1986-06-21,  MRN: [id number]  MRI review of left foot    35 y.o. female returns for post-op check.  MRI completed  Review of Systems: Negative except as noted in the HPI. Denies N/V/F/Ch.   Objective:  There were no vitals filed for this visit. There is no height or weight on file to calculate BMI. Constitutional Well developed. Well nourished.  Vascular Unchanged vascular status  Neurologic Normal speech. Oriented to person, place, and time. Epicritic sensation to light touch grossly present bilaterally.  Dermatologic Incision on the right side continues to heal there is minor central dehiscence that is superficial and does not require debridement.  The left side has a dehiscence measuring 5.5 x 1.5 x 1.5 cm with exposed subcutaneous tissue and probes deep to the level of the metatarsal head there is fat necrosis draining from the wound  Orthopedic: Tenderness to palpation noted about the surgical site.   Study Result  Narrative & Impression  CLINICAL DATA:  Foot swelling, diabetic, osteomyelitis suspected, xray done   EXAM: MRI OF THE LEFT FOOT WITHOUT CONTRAST   TECHNIQUE: Multiplanar, multisequence MR imaging of the left forefoot was performed. No intravenous contrast was administered.   COMPARISON:  Left foot radiograph 01/27/2021   FINDINGS: Bones/Joint/Cartilage   Postsurgical changes of prior fifth toe amputation and more recent second third and fourth toe amputations. There is bony edema signal and some mild low T1 signal within the second third and fourth metatarsal heads. Preserved T1 signal within the fifth metatarsal and the first ray.   Ligaments   The great toe ligamentous structures appear intact. The Lisfranc ligament is intact.   Muscles and Tendons   Diffuse intramuscular edema and atrophy in the foot, as is commonly seen in diabetics.   Soft tissues   Diffuse soft tissue  swelling of the foot. There is a prior forefoot incision with fluid signal along its course, possibly dehisced. No evidence of deeper abscess in the midfoot.   IMPRESSION: Postsurgical changes of recent 2nd-4th toe amputation, with signal abnormality within the residual 2nd-4th metatarsal heads consistent with osteomyelitis. Overlying forefoot soft tissue defect/possible wound dehiscence, with fluid signal along its course, correlate with drainage. No evidence of deeper abscess.   These results will be called to the ordering clinician or representative by the Radiologist Assistant, and communication documented in the PACS or [employer].     Electronically Signed   By: Maurine Simmering M.D.   Assessment:   1. 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 Peripheral arterial disease ([place])   3. ESRD (end stage renal disease) on dialysis ([place])   4. Critical lower limb ischemia ([place])       Plan:  Patient was evaluated and treated and all questions answered.  S/p foot surgery bilaterally -I reviewed the findings of the MRI with her and her mother in detail.  We also reviewed the images together.  Discussed with them that at this point I think the wound will not heal on its own and there is presence of osteomyelitis in the metatarsal heads.  I am recommending a transmetatarsal amputation.  I think she does not have much choice for it at this point if she wishes to save the remainder of the foot.  Discussed the risk benefits and potential complications.  We also discussed that there are no guarantees of this and that wound healing is still very  much a risk for her.  Informed sent was signed and reviewed for transmetatarsal amputation and Achilles tendon lengthening of the left foot.  This will be scheduled for next week.  All questions were addressed.   Surgical plan:  Procedure: -Transmetatarsal amputation and Achilles tendon lengthening left  foot  Location: -[facility]  Anesthesia plan: -General with block  Postoperative pain plan: - Tylenol 1000 mg every 6 hours, oxycodone 5 mg 1-2 tabs every 6 hours as needed  DVT prophylaxis: -None required  WB Restrictions / DME needs: -NWB in Body Armor CAM boot which has been dispensed today and I will bring to her case, use of knee scooter   No follow-ups on file.

## 2021-09-01 ENCOUNTER — Encounter (INDEPENDENT_AMBULATORY_CARE_PROVIDER_SITE_OTHER): Payer: Medicare Other

## 2021-09-01 ENCOUNTER — Ambulatory Visit (INDEPENDENT_AMBULATORY_CARE_PROVIDER_SITE_OTHER): Payer: Medicare Other | Admitting: Nurse Practitioner

## 2021-09-03 NOTE — Telephone Encounter (Signed)
Mom called stating she was going to  get a knee immobilizer  and her insurance  doesn't cover it. Mom was stating they will cover a wheelchair so they need a RX faxed to [place] [address]. The fax number is [phone]. Patient is having surgery on Wednesday.

## 2021-09-07 ENCOUNTER — Telehealth: Payer: Self-pay | Admitting: Urgent Care

## 2021-09-07 ENCOUNTER — Encounter: Payer: Self-pay | Admitting: Urgent Care

## 2021-09-07 ENCOUNTER — Other Ambulatory Visit: Payer: Self-pay

## 2021-09-07 ENCOUNTER — Other Ambulatory Visit
Admission: RE | Admit: 2021-09-07 | Discharge: 2021-09-07 | Disposition: A | Discharge status: Home / Self Care | Payer: Medicare Other | Source: Ambulatory Visit | Attending: Podiatry | Admitting: Podiatry

## 2021-09-07 ENCOUNTER — Telehealth: Payer: Self-pay | Admitting: Podiatry

## 2021-09-07 ENCOUNTER — Encounter
Admission: RE | Admit: 2021-09-07 | Discharge: 2021-09-07 | Disposition: A | Discharge status: Home / Self Care | Payer: Medicare Other | Source: Ambulatory Visit | Attending: Podiatry | Admitting: Podiatry

## 2021-09-07 VITALS — Ht 66.0 in | Wt 298.0 lb

## 2021-09-07 DIAGNOSIS — Z6841 Body Mass Index (BMI) 40.0 and over, adult: Secondary | ICD-10-CM | POA: Diagnosis not present

## 2021-09-07 DIAGNOSIS — R7301 Impaired fasting glucose: Secondary | ICD-10-CM | POA: Diagnosis not present

## 2021-09-07 DIAGNOSIS — I10 Essential (primary) hypertension: Secondary | ICD-10-CM

## 2021-09-07 DIAGNOSIS — Z01812 Encounter for preprocedural laboratory examination: Secondary | ICD-10-CM | POA: Diagnosis present

## 2021-09-07 DIAGNOSIS — N183 Chronic kidney disease, stage 3 unspecified: Secondary | ICD-10-CM | POA: Diagnosis not present

## 2021-09-07 DIAGNOSIS — N289 Disorder of kidney and ureter, unspecified: Secondary | ICD-10-CM

## 2021-09-07 LAB — CBC
HCT: 33.5 % — ABNORMAL LOW (ref 36.0–46.0)
Hemoglobin: 10.8 g/dL — ABNORMAL LOW (ref 12.0–15.0)
MCH: 31.2 pg (ref 26.0–34.0)
MCHC: 32.2 g/dL (ref 30.0–36.0)
MCV: 96.8 fL (ref 80.0–100.0)
Platelets: 262 10*3/uL (ref 150–400)
RBC: 3.46 MIL/uL — ABNORMAL LOW (ref 3.87–5.11)
RDW: 16.7 % — ABNORMAL HIGH (ref 11.5–15.5)
WBC: 21.1 10*3/uL — ABNORMAL HIGH (ref 4.0–10.5)
nRBC: 0 % (ref 0.0–0.2)

## 2021-09-07 LAB — BASIC METABOLIC PANEL
Anion gap: 15 (ref 5–15)
BUN: 52 mg/dL — ABNORMAL HIGH (ref 6–20)
CO2: 24 mmol/L (ref 22–32)
Calcium: 8.5 mg/dL — ABNORMAL LOW (ref 8.9–10.3)
Chloride: 95 mmol/L — ABNORMAL LOW (ref 98–111)
Creatinine, Ser: 12.37 mg/dL — ABNORMAL HIGH (ref 0.44–1.00)
GFR, Estimated: 4 mL/min — ABNORMAL LOW (ref >60)
Glucose, Bld: 100 mg/dL — ABNORMAL HIGH (ref 70–99)
Potassium: 3.1 mmol/L — ABNORMAL LOW (ref 3.5–5.1)
Sodium: 134 mmol/L — ABNORMAL LOW (ref 135–145)

## 2021-09-07 MED ORDER — CHLORHEXIDINE GLUCONATE 0.12 % MT SOLN: 15.0000 mL | Freq: Once | OROMUCOSAL | Status: AC

## 2021-09-07 MED ORDER — ORAL CARE MOUTH RINSE: 15.0000 mL | Freq: Once | OROMUCOSAL | Status: AC

## 2021-09-07 MED ORDER — CEFAZOLIN SODIUM-DEXTROSE 2-4 GM/100ML-% IV SOLN
2.0000 g | INTRAVENOUS | Status: AC
  Administered 2021-09-08: 2 g via INTRAVENOUS

## 2021-09-07 MED ORDER — SODIUM CHLORIDE 0.9 % IV SOLN: INTRAVENOUS | Status: DC

## 2021-09-07 MED ORDER — FAMOTIDINE 20 MG PO TABS: 20.0000 mg | ORAL_TABLET | Freq: Once | ORAL | Status: AC

## 2021-09-07 NOTE — Progress Notes (Addendum)
   Perioperative Services: Pre-Admission/Anesthesia Testing   Abnormal Lab Notification and Treatment Plan of Care   Date: 09/07/21  Name: [name] MRN:   [id number]  Re: Abnormal labs noted during PAT appointment   Notified:  Provider Name Provider Role Notification Mode  McDonald, Stephan Minister, DPM Podiatry Routed and/or faxed via [place] and Notes:  ABNORMAL LAB VALUE(S): Lab Results  Component Value Date   WBC 21.1 (H) 09/07/2021   K 3.1 (L) 09/07/2021   [name] is scheduled for transmetatarsal amputation and Achilles tendon lengthening procedure on 09/08/2021 with Dr. Lanae Crumbly, DPM.  In review of her medication reconciliation, it is noted that daily 100 mg torsemide dose.  Additionally, patient is already taking oral potassium supplement (Klor-Con 20 mEq daily). Please note, in efforts to promote a safe and effective anesthetic course, per current guidelines/standards set by the [facility] anesthesia team, the minimal acceptable K+ level for the patient to proceed with general anesthesia is 3.0 mmol/L. With that being said, if the patient drops any lower, her elective procedure will need to be postponed until K+ is better optimized.  Patient contacted and advised to take 2 tablets (40 mEq) tonight for a total dose today of 60 mEq.  Order placed to have K+ rechecked on the day of her procedure to ensure correction of the noted derangement.    Regarding her white count, in review of her past lab work, it appears that patient has chronic leukocytosis, with white count as being as high as 25.6 k/uL. Today's result is consistent with previous. Will send to surgeon for review.  This is a Community education officer; no formal response is required.  Honor Loh, MSN, APRN, FNP-C, CEN [facility]  Peri-operative Services Nurse Practitioner Phone: [phone] Fax: [phone] 09/07/21 3:50 PM

## 2021-09-07 NOTE — Patient Instructions (Addendum)
Your procedure is scheduled on: Wednesday 09/08/21 Report to the Registration Desk on the 1st floor of the [facility]. To find out your arrival time, please call [phone] between 1PM - 3PM on: Tuesday 09/07/21  REMEMBER: Instructions that are not followed completely may result in serious medical risk, up to and including death; or upon the discretion of your surgeon and anesthesiologist your surgery may need to be rescheduled.  Do not eat food or drink after midnight the night before surgery.  No gum chewing, lozengers or hard candies.    TAKE THESE MEDICATIONS THE MORNING OF SURGERY WITH A SIP OF WATER: amLODipine (NORVASC) 10 MG tablet atorvastatin (LIPITOR) 20 MG tablet carvedilol (COREG) 25 MG tablet isosorbide mononitrate (IMDUR) 30 MG 24 hr tablet   Follow recommendations from Cardiologist, Pulmonologist or PCP regarding stopping Aspirin, Coumadin, Plavix, Eliquis, Pradaxa, or Pletal.  One week prior to surgery: Stop Anti-inflammatories (NSAIDS) such as Advil, Aleve, Ibuprofen, Motrin, Naproxen, Naprosyn and Aspirin based products such as Excedrin, Goodys Powder, BC Powder. Stop ANY OVER THE COUNTER supplements until after surgery. You may however, continue to take Tylenol if needed for pain up until the day of surgery.  No Alcohol for 24 hours before or after surgery.  No Smoking including e-cigarettes for 24 hours prior to surgery.  No chewable tobacco products for at least 6 hours prior to surgery.  No nicotine patches on the day of surgery.  Do not use any "recreational" drugs for at least a week prior to your surgery.  Please be advised that the combination of cocaine and anesthesia may have negative outcomes, up to and including death. If you test positive for cocaine, your surgery will be cancelled.  On the morning of surgery brush your teeth with toothpaste and water, you may rinse your mouth with mouthwash if you wish. Do not swallow any toothpaste or  mouthwash.  Use CHG Soap as directed on instruction sheet.  Do not wear jewelry, make-up, hairpins, clips or nail polish.  Do not wear lotions, powders, or perfumes.   Do not shave body from the neck down 48 hours prior to surgery just in case you cut yourself which could leave a site for infection.  Also, freshly shaved skin may become irritated if using the CHG soap.  Do not bring valuables to the hospital. [facility] is not responsible for any missing/lost belongings or valuables.   Notify your doctor if there is any change in your medical condition (cold, fever, infection).  Wear comfortable clothing (specific to your surgery type) to the hospital.  After surgery, you can help prevent lung complications by doing breathing exercises.  Take deep breaths and cough every 1-2 hours. Your doctor may order a device called an Incentive Spirometer to help you take deep breaths.  If you are being discharged the day of surgery, you will not be allowed to drive home. You will need a responsible adult (18 years or older) to drive you home and stay with you that night.   If you are taking public transportation, you will need to have a responsible adult (18 years or older) with you. Please confirm with your physician that it is acceptable to use public transportation.   Please call the [facility] Dept. at [phone] if you have any questions about these instructions.  Surgery Visitation Policy:  Patients undergoing a surgery or procedure may have one family member or support person with them as long as that person is not COVID-19  positive or experiencing its symptoms.  That person may remain in the waiting area during the procedure and may rotate out with other people.  Inpatient Visitation:    Visiting hours are 7 a.m. to 8 p.m. Up to two visitors ages 16+ are allowed at one time in a patient room. The visitors may rotate out with other people during the day. Visitors must  check out when they leave, or other visitors will not be allowed. One designated support person may remain overnight. The visitor must pass COVID-19 screenings, use hand sanitizer when entering and exiting the patient's room and wear a mask at all times, including in the patient's room. Patients must also wear a mask when staff or their visitor are in the room. Masking is required regardless of vaccination status.

## 2021-09-07 NOTE — Telephone Encounter (Signed)
Mom called stating she has some questions because the patient is having surgery tomorrow and the DME company states its goiung to take up to a week for them to get the wheelchair. Can someone please call mom. Moms number is [phone].

## 2021-09-07 NOTE — Progress Notes (Addendum)
Called patient to alert her of Potassium of 3.1 and that Honor Loh, NP will get the patient to take 40 mEq of her Klor Con that she is already taking tonight and that we will recheck in the morning.

## 2021-09-07 NOTE — Telephone Encounter (Signed)
Do you mind checking on this if you have a minute?

## 2021-09-08 ENCOUNTER — Ambulatory Visit
Admission: RE | Admit: 2021-09-08 | Discharge: 2021-09-08 | Disposition: A | Discharge status: Home / Self Care | Payer: Medicare Other | Attending: Podiatry | Admitting: Podiatry

## 2021-09-08 ENCOUNTER — Ambulatory Visit: Payer: Medicare Other | Admitting: Urgent Care

## 2021-09-08 ENCOUNTER — Encounter: Payer: Self-pay | Admitting: Podiatry

## 2021-09-08 ENCOUNTER — Other Ambulatory Visit: Payer: Self-pay

## 2021-09-08 ENCOUNTER — Encounter
Admission: RE | Disposition: A | Discharge status: Home / Self Care | Payer: Self-pay | Source: Home / Self Care | Attending: Podiatry

## 2021-09-08 DIAGNOSIS — T8189XA Other complications of procedures, not elsewhere classified, initial encounter: Secondary | ICD-10-CM | POA: Diagnosis not present

## 2021-09-08 DIAGNOSIS — M24572 Contracture, left ankle: Secondary | ICD-10-CM

## 2021-09-08 DIAGNOSIS — Y835 Amputation of limb(s) as the cause of abnormal reaction of the patient, or of later complication, without mention of misadventure at the time of the procedure: Secondary | ICD-10-CM | POA: Insufficient documentation

## 2021-09-08 DIAGNOSIS — E1122 Type 2 diabetes mellitus with diabetic chronic kidney disease: Secondary | ICD-10-CM | POA: Diagnosis not present

## 2021-09-08 DIAGNOSIS — Z7902 Long term (current) use of antithrombotics/antiplatelets: Secondary | ICD-10-CM | POA: Insufficient documentation

## 2021-09-08 DIAGNOSIS — D631 Anemia in chronic kidney disease: Secondary | ICD-10-CM | POA: Diagnosis not present

## 2021-09-08 DIAGNOSIS — E1169 Type 2 diabetes mellitus with other specified complication: Secondary | ICD-10-CM | POA: Diagnosis present

## 2021-09-08 DIAGNOSIS — E11621 Type 2 diabetes mellitus with foot ulcer: Secondary | ICD-10-CM | POA: Diagnosis not present

## 2021-09-08 DIAGNOSIS — I132 Hypertensive heart and chronic kidney disease with heart failure and with stage 5 chronic kidney disease, or end stage renal disease: Secondary | ICD-10-CM | POA: Diagnosis not present

## 2021-09-08 DIAGNOSIS — I509 Heart failure, unspecified: Secondary | ICD-10-CM | POA: Diagnosis not present

## 2021-09-08 DIAGNOSIS — Z992 Dependence on renal dialysis: Secondary | ICD-10-CM | POA: Insufficient documentation

## 2021-09-08 DIAGNOSIS — Z6841 Body Mass Index (BMI) 40.0 and over, adult: Secondary | ICD-10-CM | POA: Insufficient documentation

## 2021-09-08 DIAGNOSIS — M86472 Chronic osteomyelitis with draining sinus, left ankle and foot: Secondary | ICD-10-CM | POA: Insufficient documentation

## 2021-09-08 DIAGNOSIS — Z87891 Personal history of nicotine dependence: Secondary | ICD-10-CM | POA: Insufficient documentation

## 2021-09-08 DIAGNOSIS — J45909 Unspecified asthma, uncomplicated: Secondary | ICD-10-CM | POA: Insufficient documentation

## 2021-09-08 DIAGNOSIS — E1151 Type 2 diabetes mellitus with diabetic peripheral angiopathy without gangrene: Secondary | ICD-10-CM | POA: Insufficient documentation

## 2021-09-08 DIAGNOSIS — N186 End stage renal disease: Secondary | ICD-10-CM | POA: Diagnosis not present

## 2021-09-08 DIAGNOSIS — L942 Calcinosis cutis: Secondary | ICD-10-CM | POA: Diagnosis not present

## 2021-09-08 DIAGNOSIS — L97524 Non-pressure chronic ulcer of other part of left foot with necrosis of bone: Secondary | ICD-10-CM

## 2021-09-08 HISTORY — PX: TRANSMETATARSAL AMPUTATION: SHX6197

## 2021-09-08 HISTORY — PX: TENDON LENGTHENING: SHX6786

## 2021-09-08 LAB — POCT I-STAT, CHEM 8
BUN: 79 mg/dL — ABNORMAL HIGH (ref 6–20)
Calcium, Ion: 0.93 mmol/L — ABNORMAL LOW (ref 1.15–1.40)
Chloride: 98 mmol/L (ref 98–111)
Creatinine, Ser: 15.1 mg/dL — ABNORMAL HIGH (ref 0.44–1.00)
Glucose, Bld: 89 mg/dL (ref 70–99)
HCT: 36 % (ref 36.0–46.0)
Hemoglobin: 12.2 g/dL (ref 12.0–15.0)
Potassium: 4.6 mmol/L (ref 3.5–5.1)
Sodium: 136 mmol/L (ref 135–145)
TCO2: 26 mmol/L (ref 22–32)

## 2021-09-08 LAB — GLUCOSE, CAPILLARY: Glucose-Capillary: 80 mg/dL (ref 70–99)

## 2021-09-08 SURGERY — AMPUTATION, FOOT, TRANSMETATARSAL
Anesthesia: General | Site: Toe | Laterality: Left

## 2021-09-08 MED ORDER — SUCCINYLCHOLINE CHLORIDE 200 MG/10ML IV SOSY
PREFILLED_SYRINGE | INTRAVENOUS | Status: AC
  Filled 2021-09-08: qty 10

## 2021-09-08 MED ORDER — CHLORHEXIDINE GLUCONATE 0.12 % MT SOLN
OROMUCOSAL | Status: AC
  Administered 2021-09-08: 15 mL via OROMUCOSAL
  Filled 2021-09-08: qty 15

## 2021-09-08 MED ORDER — ONDANSETRON HCL 4 MG/2ML IJ SOLN: 4.0000 mg | Freq: Once | INTRAMUSCULAR | Status: DC | PRN

## 2021-09-08 MED ORDER — SUCCINYLCHOLINE CHLORIDE 200 MG/10ML IV SOSY
PREFILLED_SYRINGE | INTRAVENOUS | Status: DC | PRN
  Administered 2021-09-08: 140 mg via INTRAVENOUS

## 2021-09-08 MED ORDER — DEXAMETHASONE SODIUM PHOSPHATE 10 MG/ML IJ SOLN
INTRAMUSCULAR | Status: DC | PRN
  Administered 2021-09-08: 5 mg via INTRAVENOUS

## 2021-09-08 MED ORDER — ROCURONIUM BROMIDE 10 MG/ML (PF) SYRINGE
PREFILLED_SYRINGE | INTRAVENOUS | Status: AC
  Filled 2021-09-08: qty 10

## 2021-09-08 MED ORDER — PHENYLEPHRINE HCL (PRESSORS) 10 MG/ML IV SOLN
INTRAVENOUS | Status: DC | PRN
  Administered 2021-09-08: 80 ug via INTRAVENOUS
  Administered 2021-09-08: 160 ug via INTRAVENOUS

## 2021-09-08 MED ORDER — MIDAZOLAM HCL 2 MG/2ML IJ SOLN
INTRAMUSCULAR | Status: DC | PRN
  Administered 2021-09-08: 2 mg via INTRAVENOUS

## 2021-09-08 MED ORDER — IPRATROPIUM-ALBUTEROL 20-100 MCG/ACT IN AERS
INHALATION_SPRAY | RESPIRATORY_TRACT | Status: DC | PRN
  Administered 2021-09-08: 6 via RESPIRATORY_TRACT

## 2021-09-08 MED ORDER — HYDROCODONE-ACETAMINOPHEN 5-325 MG PO TABS
1.0000 | ORAL_TABLET | Freq: Four times a day (QID) | ORAL | Status: DC | PRN
  Administered 2021-09-08: 1 via ORAL

## 2021-09-08 MED ORDER — ONDANSETRON HCL 4 MG/2ML IJ SOLN
INTRAMUSCULAR | Status: DC | PRN
  Administered 2021-09-08: 4 mg via INTRAVENOUS

## 2021-09-08 MED ORDER — LIDOCAINE HCL (PF) 2 % IJ SOLN
INTRAMUSCULAR | Status: AC
  Filled 2021-09-08: qty 5

## 2021-09-08 MED ORDER — LACTATED RINGERS IV SOLN: INTRAVENOUS | Status: DC

## 2021-09-08 MED ORDER — MIDAZOLAM HCL 2 MG/2ML IJ SOLN
INTRAMUSCULAR | Status: AC
  Filled 2021-09-08: qty 2

## 2021-09-08 MED ORDER — FENTANYL CITRATE (PF) 100 MCG/2ML IJ SOLN
INTRAMUSCULAR | Status: AC
  Filled 2021-09-08: qty 2

## 2021-09-08 MED ORDER — 0.9 % SODIUM CHLORIDE (POUR BTL) OPTIME
TOPICAL | Status: DC | PRN
  Administered 2021-09-08: 500 mL

## 2021-09-08 MED ORDER — ALBUTEROL SULFATE HFA 108 (90 BASE) MCG/ACT IN AERS
INHALATION_SPRAY | RESPIRATORY_TRACT | Status: AC
  Filled 2021-09-08: qty 6.7

## 2021-09-08 MED ORDER — ROCURONIUM BROMIDE 100 MG/10ML IV SOLN
INTRAVENOUS | Status: DC | PRN
  Administered 2021-09-08: 5 mg via INTRAVENOUS
  Administered 2021-09-08: 20 mg via INTRAVENOUS

## 2021-09-08 MED ORDER — HYDROCODONE-ACETAMINOPHEN 5-325 MG PO TABS
ORAL_TABLET | ORAL | Status: AC
  Filled 2021-09-08: qty 1

## 2021-09-08 MED ORDER — HYDROCODONE-ACETAMINOPHEN 5-325 MG PO TABS: ORAL_TABLET | ORAL | 0 refills | Status: DC

## 2021-09-08 MED ORDER — KETAMINE HCL 50 MG/ML IJ SOLN
INTRAMUSCULAR | Status: DC | PRN
  Administered 2021-09-08 (×2): 25 mg via INTRAMUSCULAR

## 2021-09-08 MED ORDER — VANCOMYCIN HCL 1000 MG IV SOLR
INTRAVENOUS | Status: AC
  Filled 2021-09-08: qty 20

## 2021-09-08 MED ORDER — LIDOCAINE HCL 1 % IJ SOLN
INTRAMUSCULAR | Status: DC | PRN
  Administered 2021-09-08: 20 mL via INTRAMUSCULAR

## 2021-09-08 MED ORDER — LIDOCAINE HCL (CARDIAC) PF 100 MG/5ML IV SOSY
PREFILLED_SYRINGE | INTRAVENOUS | Status: DC | PRN
  Administered 2021-09-08: 100 mg via INTRAVENOUS

## 2021-09-08 MED ORDER — ONDANSETRON HCL 4 MG PO TABS: 4.0000 mg | ORAL_TABLET | Freq: Four times a day (QID) | ORAL | 0 refills | Status: AC | PRN

## 2021-09-08 MED ORDER — SODIUM CHLORIDE 0.9 % IR SOLN
Status: DC | PRN
  Administered 2021-09-08: 3000 mL

## 2021-09-08 MED ORDER — SUGAMMADEX SODIUM 500 MG/5ML IV SOLN
INTRAVENOUS | Status: AC
  Filled 2021-09-08: qty 5

## 2021-09-08 MED ORDER — VANCOMYCIN HCL 1000 MG IV SOLR
INTRAVENOUS | Status: DC | PRN
  Administered 2021-09-08: 1000 mg via TOPICAL

## 2021-09-08 MED ORDER — GLYCOPYRROLATE 0.2 MG/ML IJ SOLN
INTRAMUSCULAR | Status: AC
  Filled 2021-09-08: qty 1

## 2021-09-08 MED ORDER — DOXYCYCLINE HYCLATE 100 MG PO TABS: 100.0000 mg | ORAL_TABLET | Freq: Two times a day (BID) | ORAL | 0 refills | Status: DC

## 2021-09-08 MED ORDER — FENTANYL CITRATE (PF) 100 MCG/2ML IJ SOLN
INTRAMUSCULAR | Status: DC | PRN
  Administered 2021-09-08: 50 ug via INTRAVENOUS

## 2021-09-08 MED ORDER — PROPOFOL 10 MG/ML IV BOLUS
INTRAVENOUS | Status: AC
  Filled 2021-09-08: qty 20

## 2021-09-08 MED ORDER — SUGAMMADEX SODIUM 500 MG/5ML IV SOLN
INTRAVENOUS | Status: DC | PRN
  Administered 2021-09-08: 300 mg via INTRAVENOUS

## 2021-09-08 MED ORDER — LIDOCAINE HCL (PF) 1 % IJ SOLN
INTRAMUSCULAR | Status: AC
  Filled 2021-09-08: qty 30

## 2021-09-08 MED ORDER — CEFAZOLIN SODIUM-DEXTROSE 2-4 GM/100ML-% IV SOLN
INTRAVENOUS | Status: AC
  Filled 2021-09-08: qty 100

## 2021-09-08 MED ORDER — ONDANSETRON HCL 4 MG/2ML IJ SOLN
INTRAMUSCULAR | Status: AC
  Administered 2021-09-08: 4 mg
  Filled 2021-09-08: qty 2

## 2021-09-08 MED ORDER — BUPIVACAINE HCL (PF) 0.5 % IJ SOLN
INTRAMUSCULAR | Status: AC
  Filled 2021-09-08: qty 30

## 2021-09-08 MED ORDER — FAMOTIDINE 20 MG PO TABS
ORAL_TABLET | ORAL | Status: AC
  Administered 2021-09-08: 20 mg via ORAL
  Filled 2021-09-08: qty 1

## 2021-09-08 MED ORDER — PROPOFOL 10 MG/ML IV BOLUS
INTRAVENOUS | Status: DC | PRN
  Administered 2021-09-08: 200 mg via INTRAVENOUS

## 2021-09-08 MED ORDER — FENTANYL CITRATE (PF) 100 MCG/2ML IJ SOLN: 25.0000 ug | INTRAMUSCULAR | Status: DC | PRN

## 2021-09-08 MED ORDER — DEXAMETHASONE SODIUM PHOSPHATE 10 MG/ML IJ SOLN
INTRAMUSCULAR | Status: AC
  Filled 2021-09-08: qty 1

## 2021-09-08 MED ORDER — ONDANSETRON HCL 4 MG/2ML IJ SOLN
INTRAMUSCULAR | Status: AC
  Filled 2021-09-08: qty 2

## 2021-09-08 MED ORDER — KETAMINE HCL 50 MG/5ML IJ SOSY
PREFILLED_SYRINGE | INTRAMUSCULAR | Status: AC
  Filled 2021-09-08: qty 5

## 2021-09-08 SURGICAL SUPPLY — 59 items
BLADE OSCILLATING/SAGITTAL (BLADE) ×3
BLADE SURG 10 STRL SS SAFETY (BLADE) IMPLANT
BLADE SW THK.38XMED LNG THN (BLADE) ×2 IMPLANT
BNDG CMPR 75X41 PLY HI ABS (GAUZE/BANDAGES/DRESSINGS) ×2
BNDG CMPR STD VLCR NS LF 5.8X4 (GAUZE/BANDAGES/DRESSINGS) ×2
BNDG COHESIVE 4X5 TAN ST LF (GAUZE/BANDAGES/DRESSINGS) ×3 IMPLANT
BNDG ELASTIC 4X5.8 VLCR NS LF (GAUZE/BANDAGES/DRESSINGS) ×3 IMPLANT
BNDG ESMARK 4X12 TAN STRL LF (GAUZE/BANDAGES/DRESSINGS) ×3 IMPLANT
BNDG GAUZE ELAST 4 BULKY (GAUZE/BANDAGES/DRESSINGS) ×3 IMPLANT
BNDG STRETCH 4X75 STRL LF (GAUZE/BANDAGES/DRESSINGS) ×3 IMPLANT
BULB RESERV EVAC DRAIN JP 100C (MISCELLANEOUS) ×3 IMPLANT
CUFF TOURN SGL QUICK 18X4 (TOURNIQUET CUFF) IMPLANT
CUFF TOURN SGL QUICK 24 (TOURNIQUET CUFF)
CUFF TRNQT CYL 24X4X16.5-23 (TOURNIQUET CUFF) IMPLANT
DRAIN CHANNEL JP 10F RND 20C F (MISCELLANEOUS) ×3 IMPLANT
DRAIN PENROSE 12X.25 LTX STRL (MISCELLANEOUS) IMPLANT
DRAPE FLUOR MINI C-ARM 54X84 (DRAPES) IMPLANT
DRSG EMULSION OIL 3X8 NADH (GAUZE/BANDAGES/DRESSINGS) IMPLANT
DURAPREP 26ML APPLICATOR (WOUND CARE) IMPLANT
ELECT REM PT RETURN 9FT ADLT (ELECTROSURGICAL) ×3
ELECTRODE REM PT RTRN 9FT ADLT (ELECTROSURGICAL) ×2 IMPLANT
GAUZE 4X4 16PLY ~~LOC~~+RFID DBL (SPONGE) ×3 IMPLANT
GAUZE PACKING IODOFORM 1/2 (PACKING) IMPLANT
GAUZE SPONGE 4X4 12PLY STRL (GAUZE/BANDAGES/DRESSINGS) ×6 IMPLANT
GLOVE SRG 8 PF TXTR STRL LF DI (GLOVE) ×2 IMPLANT
GLOVE SURG ENC TEXT LTX SZ8 (GLOVE) ×3 IMPLANT
GLOVE SURG SYN 7.0 (GLOVE) ×3 IMPLANT
GLOVE SURG UNDER POLY LF SZ8 (GLOVE) ×3
GOWN STRL REUS W/ TWL XL LVL3 (GOWN DISPOSABLE) ×2 IMPLANT
GOWN STRL REUS W/TWL XL LVL3 (GOWN DISPOSABLE) ×3
HANDLE YANKAUER SUCT BULB TIP (MISCELLANEOUS) IMPLANT
KIT TURNOVER KIT A (KITS) ×3 IMPLANT
LABEL OR SOLS (LABEL) IMPLANT
MANIFOLD NEPTUNE II (INSTRUMENTS) ×3 IMPLANT
NDL SAFETY ECLIPSE 18X1.5 (NEEDLE) ×2 IMPLANT
NEEDLE FILTER BLUNT 18X 1/2SAF (NEEDLE) ×1
NEEDLE FILTER BLUNT 18X1 1/2 (NEEDLE) ×2 IMPLANT
NEEDLE HYPO 18GX1.5 SHARP (NEEDLE) ×3
NS IRRIG 500ML POUR BTL (IV SOLUTION) ×3 IMPLANT
PACK EXTREMITY ARMC (MISCELLANEOUS) ×3 IMPLANT
PAD ABD DERMACEA PRESS 5X9 (GAUZE/BANDAGES/DRESSINGS) ×6 IMPLANT
PIN SAFETY STRL (MISCELLANEOUS) ×3 IMPLANT
PULSAVAC PLUS IRRIG FAN TIP (DISPOSABLE) ×3
SOL PREP PVP 2OZ (MISCELLANEOUS) ×3
SOLUTION PREP PVP 2OZ (MISCELLANEOUS) ×2 IMPLANT
SPONGE T-LAP 18X18 ~~LOC~~+RFID (SPONGE) IMPLANT
STAPLER SKIN PROX 35W (STAPLE) ×3 IMPLANT
STOCKINETTE M/LG 89821 (MISCELLANEOUS) ×3 IMPLANT
SUT ETHILON 3-0 FS-10 30 BLK (SUTURE) ×6
SUT MNCRL AB 3-0 PS2 27 (SUTURE) ×3 IMPLANT
SUT PDS 2-0 27IN (SUTURE) ×3 IMPLANT
SUT PDS PLUS 2 (SUTURE) ×3
SUT PDS PLUS AB 2-0 CT-1 (SUTURE) ×2 IMPLANT
SUT PROLENE 3 0 PS 2 (SUTURE) IMPLANT
SUTURE EHLN 3-0 FS-10 30 BLK (SUTURE) ×4 IMPLANT
SWAB CULTURE AMIES ANAERIB BLU (MISCELLANEOUS) IMPLANT
SYR 10ML LL (SYRINGE) ×6 IMPLANT
TIP FAN IRRIG PULSAVAC PLUS (DISPOSABLE) ×2 IMPLANT
WATER STERILE IRR 500ML POUR (IV SOLUTION) ×3 IMPLANT

## 2021-09-08 NOTE — Transfer of Care (Signed)
Immediate Anesthesia Transfer of Care Note  Patient: [name] A Librizzi  Procedure(s) Performed: TRANSMETATARSAL AMPUTATION (Left: Toe) TENDON ACHILLES LENGTHENING (Left: Foot)  Patient Location: PACU  Anesthesia Type:General  Level of Consciousness: drowsy  Airway & Oxygen Therapy: Patient Spontanous Breathing and Patient connected to face mask oxygen  Post-op Assessment: Report given to RN and Post -op Vital signs reviewed and stable  Post vital signs: Reviewed and stable  Last Vitals:  Vitals Value Taken Time  BP 132/89 09/08/21 1756  Temp    Pulse 104 09/08/21 1756  Resp    SpO2 93 % 09/08/21 1756  Vitals shown include unvalidated device data.  Last Pain:  Vitals:   09/08/21 1435  TempSrc: Oral  PainSc: 0-No pain         Complications: No notable events documented.

## 2021-09-08 NOTE — Op Note (Incomplete)
Patient Name: [name] DOB: 12/28/1985  MRN: [id number]   Date of Service: 09/08/2021  Surgeon: Dr. Lanae Crumbly, DPM Assistants: None Pre-operative Diagnosis:  Chronic ulcer of left foot  Post-operative Diagnosis:  * No Diagnosis Codes entered * Procedures:  1) *** Pathology/Specimens: ID Type Source Tests Collected by Time Destination  1 : LEFT FOOT BONE Tissue PATH Other SURGICAL PATHOLOGY Criselda Peaches, DPM 09/08/2021 1651   A : LEFT FOOT BONE CULTURE Tissue PATH Other AEROBIC/ANAEROBIC CULTURE W GRAM STAIN (SURGICAL/DEEP WOUND) Criselda Peaches, DPM 09/08/2021 1651    Anesthesia: *** Hemostasis: * Missing tourniquet times found for documented tourniquets in log: 149702 * Estimated Blood Loss: {None/Minimal: 21241} Materials: * No implants in log * Medications: *** Complications: ***  Indications for Procedure:  This is a 35 y.o. female with a ***   Procedure in Detail: Patient was identified in pre-operative holding area. Formal consent was signed and the *** lower extremity was marked. Patient was brought back to the operating room. Anesthesia was induced. The extremity was prepped and draped in the usual sterile fashion. Timeout was taken to confirm patient name, laterality, and procedure prior to incision.   Attention was then directed to the ***  The foot was then dressed with ***. Patient tolerated the procedure well.   Disposition: Following a period of post-operative monitoring, patient will be transferred ***.

## 2021-09-08 NOTE — Interval H&P Note (Signed)
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  The various methods of treatment have been discussed with the patient and family. After consideration of risks, benefits and other options for treatment, the patient has consented to  Procedure(s): TRANSMETATARSAL AMPUTATION (Left) TENDON ACHILLES LENGTHENING (Left) as a surgical intervention.  The patient's history has been reviewed, patient examined, no change in status, stable for surgery.  I have reviewed the patient's chart and labs.  Questions were answered to the patient's satisfaction.     [name]

## 2021-09-08 NOTE — Anesthesia Procedure Notes (Signed)
Procedure Name: Intubation Date/Time: 09/08/2021 4:13 PM Performed by: Johnna Acosta, CRNA Pre-anesthesia Checklist: Patient identified, Emergency Drugs available, Suction available, Patient being monitored and Timeout performed Patient Re-evaluated:Patient Re-evaluated prior to induction Oxygen Delivery Method: Circle system utilized Preoxygenation: Pre-oxygenation with 100% oxygen Induction Type: IV induction and Rapid sequence Ventilation: Mask ventilation with difficulty Laryngoscope Size: McGraph and 3 Grade View: Grade I Tube type: Oral Tube size: 7.5 mm Number of attempts: 1 Airway Equipment and Method: Stylet and Video-laryngoscopy Placement Confirmation: ETT inserted through vocal cords under direct vision, positive ETCO2 and breath sounds checked- equal and bilateral Secured at: 22 cm Tube secured with: Tape Dental Injury: Teeth and Oropharynx as per pre-operative assessment  Difficulty Due To: Difficulty was anticipated, Difficult Airway- due to large tongue, Difficult Airway- due to reduced neck mobility and Difficult Airway- due to limited oral opening Future Recommendations: Recommend- induction with short-acting agent, and alternative techniques readily available

## 2021-09-08 NOTE — Anesthesia Preprocedure Evaluation (Addendum)
Anesthesia Evaluation  Patient identified by MRN, date of birth, ID band Patient awake    Reviewed: Allergy & Precautions, NPO status , Patient's Chart, lab work & pertinent test results  History of Anesthesia Complications Negative for: history of anesthetic complications  Airway Mallampati: III   Neck ROM: Full    Dental   Missing molar x1:   Pulmonary asthma (childhood) , former smoker,    Pulmonary exam normal breath sounds clear to auscultation       Cardiovascular hypertension, + Peripheral Vascular Disease (on Plavix) and +CHF  Normal cardiovascular exam Rhythm:Regular Rate:Normal  ECG 07/07/21: Sinus tachycardia (HR 110), RBBB   Neuro/Psych negative neurological ROS     GI/Hepatic negative GI ROS,   Endo/Other  diabetes, Type 2Class 3 obesity  Renal/GU ESRF and DialysisRenal disease (last PD 09/07/21)     Musculoskeletal Chronic osteomyelitis left foot   Abdominal   Peds  Hematology  (+) Blood dyscrasia, anemia ,   Anesthesia Other Findings   Reproductive/Obstetrics                            Anesthesia Physical Anesthesia Plan  ASA: 4  Anesthesia Plan: General   Post-op Pain Management:    Induction: Intravenous  PONV Risk Score and Plan: 3 and Ondansetron, Dexamethasone and Treatment may vary due to age or medical condition  Airway Management Planned: Oral ETT  Additional Equipment:   Intra-op Plan:   Post-operative Plan: Extubation in OR  Informed Consent: I have reviewed the patients History and Physical, chart, labs and discussed the procedure including the risks, benefits and alternatives for the proposed anesthesia with the patient or authorized representative who has indicated his/her understanding and acceptance.     Dental advisory given  Plan Discussed with: CRNA  Anesthesia Plan Comments: (Patient consented for risks of anesthesia including but not  limited to:  - adverse reactions to medications - damage to eyes, teeth, lips or other oral mucosa - nerve damage due to positioning  - sore throat or hoarseness - damage to heart, brain, nerves, lungs, other parts of body or loss of life  Informed patient about role of CRNA in peri- and intra-operative care.  Patient voiced understanding.)        Anesthesia Quick Evaluation

## 2021-09-08 NOTE — Anesthesia Postprocedure Evaluation (Signed)
Anesthesia Post Note  Patient: [name] A Vanasten  Procedure(s) Performed: TRANSMETATARSAL AMPUTATION (Left: Toe) TENDON ACHILLES LENGTHENING (Left: Foot)  Patient location during evaluation: PACU Anesthesia Type: General Level of consciousness: awake and alert Pain management: pain level controlled Vital Signs Assessment: post-procedure vital signs reviewed and stable Respiratory status: spontaneous breathing, nonlabored ventilation, respiratory function stable and patient connected to nasal cannula oxygen Cardiovascular status: blood pressure returned to baseline and stable Postop Assessment: no apparent nausea or vomiting Anesthetic complications: no   No notable events documented.   Last Vitals:  Vitals:   09/08/21 1841 09/08/21 1844  BP: 124/78   Pulse: (!) 104   Resp: 20   Temp: (!) 36.1 C   SpO2: 94% 97%    Last Pain:  Vitals:   09/08/21 1841  TempSrc: Temporal  PainSc: 4                  Arita Miss

## 2021-09-08 NOTE — Discharge Instructions (Addendum)
Post-Surgery Instructions  1. If you are recuperating from surgery anywhere other than home, please be sure to leave [place] a number where you can be reached. 2. Go directly home and rest. 3. The keep operated foot (or feet) elevated six inches above the hip when sitting or lying down. 4. Support the elevated foot and leg with pillows under the calf. DO NOT PLACE PILLOWS UNDER THE KNEE. 5. DO NOT REMOVE or get your bandages wet. This will increase your chances of getting an infection. 6. Wear your surgical shoe at all times when you are up. 7. A limited amount of pain and swelling may occur. The skin may take on a bruised appearance. This is no cause for alarm. 8. For slight pain and swelling, apply an ice pack directly over the bandage for 15 minutes every hour. Continue icing until seen in the office. DO NOT apply any form of heat to the area. 9. Have prescription(s) filled immediately and take as directed. 10. Drink lots of liquids, water, and juice. 11. CALL THE OFFICE IMMEDIATELY IF: a. Bleeding continues b. Pain increases and/or does not respond to medication c. Bandage or cast appears too tight d. Any liquids (water, coffee, etc.) have spilled on your bandages. e. Tripping, falling, or stubbing the surgical foot f. If your temperature rises above 101 g. If you have ANY questions at all 12. Please use the crutches, knee scooter, or walker you have prescribed, rented, or purchased. If you are non-weight bearing DO NOT put weight on the operated foot for _________ days. If you are weight-bearing, follow your physician's instructions. You are expected to be: ? weight-bearing ? non-weight bearing 13. Special Instructions: Gwyndolyn Kaufman may be removed when in bed or resting, otherwise must stay on   14. Your next appointment is: 09/15/2021 10:15 AM  If you need to reach the nurse for any reason, please call: [place]: [phone] [place]: [phone] [place]: [phone] AMBULATORY SURGERY  DISCHARGE INSTRUCTIONS   The drugs that you were given will stay in your system until tomorrow so for the next 24 hours you should not:  Drive an automobile Make any legal decisions Drink any alcoholic beverage   You may resume regular meals tomorrow.  Today it is better to start with liquids and gradually work up to solid foods.  You may eat anything you prefer, but it is better to start with liquids, then soup and crackers, and gradually work up to solid foods.   Please notify your doctor immediately if you have any unusual bleeding, trouble breathing, redness and pain at the surgery site, drainage, fever, or pain not relieved by medication.    Additional Instructions:        Please contact your physician with any problems or Same Day Surgery at [phone], Monday through Friday 6 am to 4 pm, or  at [facility] number at [phone].

## 2021-09-08 NOTE — Brief Op Note (Signed)
09/08/2021  5:56 PM  PATIENT:  [name]  35 y.o. 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  POST-OPERATIVE DIAGNOSIS:  CHRONIC OSTEOMYOLITIS LEFT FOOT AND CONTRACTURE OF TENDON  PROCEDURE:  Procedure(s): TRANSMETATARSAL AMPUTATION (Left) TENDON ACHILLES LENGTHENING (Left)  SURGEON:  Surgeon(s) and Role:    * Nariyah Osias, Stephan Minister, DPM - Primary  PHYSICIAN ASSISTANT:   ASSISTANTS: none   ANESTHESIA:   local and general  EBL:  50cc   BLOOD ADMINISTERED:none  DRAINS: (yes) Jackson-Pratt drain(s) with closed bulb suction in the left foot    LOCAL MEDICATIONS USED:  MARCAINE   , XYLOCAINE , and Amount: 20 ml  SPECIMEN:  left foot metatarsals  DISPOSITION OF SPECIMEN:  micro and path  COUNTS:  YES  TOURNIQUET:  * Missing tourniquet times found for documented tourniquets in log: [id number] *  DICTATION: .Note written in EPIC  PLAN OF CARE: Discharge to home after PACU  PATIENT DISPOSITION:  PACU - hemodynamically stable.   Delay start of Pharmacological VTE agent (>24hrs) due to surgical blood loss or risk of bleeding: yes

## 2021-09-09 ENCOUNTER — Encounter: Payer: Self-pay | Admitting: Podiatry

## 2021-09-11 DIAGNOSIS — L97524 Non-pressure chronic ulcer of other part of left foot with necrosis of bone: Secondary | ICD-10-CM

## 2021-09-11 DIAGNOSIS — M24572 Contracture, left ankle: Secondary | ICD-10-CM

## 2021-09-11 NOTE — Op Note (Signed)
Patient Name: [name] DOB: Sep 30, 1986  MRN: [id number]   Date of Service: 09/08/2021  Surgeon: Dr. 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 Non healing surgical wound Type 2 diabetes Peripheral arterial disease ESRD Procedures:  1) transmetatarsal amputation left foot  2) Achilles tendon lengthening Pathology/Specimens: ID Type Source Tests Collected by Time Destination  1 : LEFT FOOT BONE Tissue PATH Other SURGICAL PATHOLOGY Criselda Peaches, DPM 09/08/2021 1651   A : LEFT FOOT BONE CULTURE Tissue PATH Other AEROBIC/ANAEROBIC CULTURE W GRAM STAIN (SURGICAL/DEEP WOUND) Criselda Peaches, DPM 09/08/2021 1651    Anesthesia: general anesthesia with local ankle block Hemostasis: * Missing tourniquet times found for documented tourniquets in log: 102725 * Estimated Blood Loss: 25 mL Materials: * No implants in log * Medications: 20cc 2% lidocaine plain and 0.5% marcaine plain in 36/64 mixture Complications: none  Indications for Procedure:  This is a 35 y.o. female with a history of prior lesser toe amputations with poor healing.  She has a history of end-stage renal disease peripheral arterial disease and type 2 diabetes as contributing factors. After failing non surgical treatment I recommended transmetatarsal amputation and Achilles tendon lengthening. All questions were addressed prior to surgery. No guarantees were given.   Procedure in Detail: Patient was identified in pre-operative holding area. Formal consent was signed and the left lower extremity was marked. Patient was brought back to the operating room. Anesthesia was induced. The extremity was prepped and draped in the usual sterile fashion. Timeout was taken to  confirm patient name, laterality, and procedure prior to incision.   Attention was then directed to the left foot where the open ulcer had been covered with a Tegaderm. I began by performing a percutaneous Achilles tendon lengthening in Hoke triple hemisection fashion with incisions placed 1cm proximal to the insertion medially, 1cm proximal to this laterally, and 1cm proximal to this again medially. Gentle dorsiflexion was placed on the foot and adequate length of the tendon was made.  I then began the amputation with a fishmouth type incision over the midfoot proximal to the necrotic tissues. At the resection margin all tissue appeared healthy and viable. Blunt and sharp dissection was carried to the level of the metatarsals and a sagittal saw used to create the transmetatarsal amputation. A bone culture was taken from the 2nd metatarsal head which appeared myelitic. The soft tissues and bone was resected and sent for pathology. The wound was irrigated with 3L NSS. The flaps were inspected and were free of necrosis. Perfusion was not excellent but acceptable. Hemostasis was achieved. 1g vancomycin powder was placed in the wound. Layered closure was performed to reapproximate the flaps with 2-0 PDS, 3-0 monocryl, 2-0 nylon and skin staples. A 10Fr round JP drain was placed with exit through the dorsal skin.  The foot was then dressed with xeroform, dry sterile gauze dressings and an ACE wrap without compression or tension. Patient tolerated the procedure well.   Disposition: Following a period of post-operative monitoring, patient will be transferred to home.

## 2021-09-14 LAB — AEROBIC/ANAEROBIC CULTURE W GRAM STAIN (SURGICAL/DEEP WOUND)

## 2021-09-15 ENCOUNTER — Ambulatory Visit (INDEPENDENT_AMBULATORY_CARE_PROVIDER_SITE_OTHER): Payer: Medicare Other | Admitting: Podiatry

## 2021-09-15 ENCOUNTER — Encounter: Payer: Medicare Other | Admitting: Podiatry

## 2021-09-15 ENCOUNTER — Other Ambulatory Visit: Payer: Self-pay

## 2021-09-15 ENCOUNTER — Ambulatory Visit (INDEPENDENT_AMBULATORY_CARE_PROVIDER_SITE_OTHER): Payer: Medicare Other

## 2021-09-15 DIAGNOSIS — Z9889 Other specified postprocedural states: Secondary | ICD-10-CM

## 2021-09-15 DIAGNOSIS — M86472 Chronic osteomyelitis with draining sinus, left ankle and foot: Secondary | ICD-10-CM

## 2021-09-15 LAB — SURGICAL PATHOLOGY

## 2021-09-15 NOTE — Progress Notes (Signed)
  Subjective:  Patient ID: [name], female    DOB: February 06, 1986,  MRN: [id number]  Chief Complaint  Patient presents with   Routine Post Op     (xray)POV #1 DOS 09/08/2021 TRANSMETATARSAL AMPUTATION & ACHILLES TENDON LENGTHENING LT FOOT    35 y.o. female returns for post-op check.  Having difficulty getting around with the boot.  Pain has been improving.  They have been stripping and draining the JP drain and tracking drainage.  Review of Systems: Negative except as noted in the HPI. Denies N/V/F/Ch.   Objective:  There were no vitals filed for this visit. There is no height or weight on file to calculate BMI. Constitutional Well developed. Well nourished.  Vascular Foot warm and well perfused. Capillary refill normal to all digits.   Neurologic Normal speech. Oriented to person, place, and time. Epicritic sensation to light touch grossly present bilaterally.  Dermatologic Skin healing well without signs of infection. Skin edges well coapted without signs of infection.  JP drain with minimal drainage  Orthopedic: Tenderness to palpation noted about the surgical site.   Multiple view plain film radiographs: Status post transmetatarsal amputation  Culture with staph epidermidis and Candida species and corynebacterium  Pathology results show osteomyelitis with clean resection margins Assessment:   1. 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 Post-operative state    Plan:  Patient was evaluated and treated and all questions answered.  S/p foot surgery left -Progressing as expected post-operatively. -XR: As above good resection margins and parabola maintained -WB Status: WBAT in amputation boot which was dispensed at surgery -Sutures: Sutures and staples to leave intact for 3 to 4 weeks. -Medications: No refills required she will finish her current antibiotics -Foot redressed. -She is having difficulty getting around the house.  She may now  weight-bear in the boot.  I adjusted this as well as the dressings over that we more comfortable and tolerable.  We will send a referral to [facility] home physical therapy to begin working with her.  If not able to do this then we will plan for outpatient physical therapy in person Return in about 2 weeks (around 09/29/2021) for post op (no x-rays), suture removal.

## 2021-09-21 ENCOUNTER — Ambulatory Visit (INDEPENDENT_AMBULATORY_CARE_PROVIDER_SITE_OTHER): Payer: Medicare Other | Admitting: Vascular Surgery

## 2021-09-21 ENCOUNTER — Encounter (INDEPENDENT_AMBULATORY_CARE_PROVIDER_SITE_OTHER): Payer: Medicare Other

## 2021-09-27 ENCOUNTER — Other Ambulatory Visit: Payer: Self-pay

## 2021-09-27 ENCOUNTER — Ambulatory Visit (INDEPENDENT_AMBULATORY_CARE_PROVIDER_SITE_OTHER): Payer: Medicare Other | Admitting: Podiatry

## 2021-09-27 DIAGNOSIS — Z9889 Other specified postprocedural states: Secondary | ICD-10-CM

## 2021-09-27 NOTE — Progress Notes (Signed)
  Subjective:  Patient ID: [name], female    DOB: [birth year],  MRN: [id number]  Chief Complaint  Patient presents with   Routine Post Op      POV #2 DOS 09/08/2021 TRANSMETATARSAL AMPUTATION & ACHILLES TENDON LENGTHENING LT FOOT    35 y.o. female returns for post-op check.  PT has kept her active and trying to strengthen and having her walk multiple times a day.  They have been changing the dressing every other day  Review of Systems: Negative except as noted in the HPI. Denies N/V/F/Ch.   Objective:  There were no vitals filed for this visit. There is no height or weight on file to calculate BMI. Constitutional Well developed. Well nourished.  Vascular Foot warm and well perfused. Capillary refill normal to all digits.   Neurologic Normal speech. Oriented to person, place, and time. Epicritic sensation to light touch grossly present bilaterally.  Dermatologic Maceration anterior portion of the incision today  Orthopedic: Tenderness to palpation noted about the surgical site.   Multiple view plain film radiographs: Status post transmetatarsal amputation  Culture with staph epidermidis and Candida species and corynebacterium  Pathology results show osteomyelitis with clean resection margins Assessment:   1. S/P foot surgery, left    Plan:  Patient was evaluated and treated and all questions answered.  S/p foot surgery left -Progressing as expected post-operatively. -XR: As above good resection margins and parabola maintained -WB Status: WBAT in amputation boot only as necessary -Sutures: Posterior Achilles incision sutures removed we will remove remainder of anterior incision at next visit -Foot redressed. -Would like her to decrease PT at this point to only focus on ambulation with assistive devices as absolutely necessary for ADLs.  Can focus on strengthening conditioning once her incision is healed Return in about 2 days (around 09/29/2021) for post op (no  x-rays), suture removal.

## 2021-09-29 ENCOUNTER — Encounter: Payer: BLUE CROSS/BLUE SHIELD | Admitting: Podiatry

## 2021-10-04 ENCOUNTER — Telehealth: Payer: Self-pay | Admitting: Podiatry

## 2021-10-04 NOTE — Telephone Encounter (Signed)
Call and ask if you can have some more of the wound care products sent to there home again due to her being out of it now

## 2021-10-05 ENCOUNTER — Telehealth: Payer: Self-pay | Admitting: Podiatry

## 2021-10-05 NOTE — Telephone Encounter (Signed)
Patients mom called stating she called for supplies to [facility] ans the person told mom that her insurance wont cover her to have a home health aide and to get the supplies. Mom doesn't know what to do. Mom states she does have the financials to pay for supplies.

## 2021-10-13 ENCOUNTER — Encounter: Payer: Self-pay | Admitting: Hematology and Oncology

## 2021-10-13 ENCOUNTER — Other Ambulatory Visit: Payer: Self-pay

## 2021-10-13 ENCOUNTER — Ambulatory Visit (INDEPENDENT_AMBULATORY_CARE_PROVIDER_SITE_OTHER): Payer: Medicare Other | Admitting: Podiatry

## 2021-10-13 DIAGNOSIS — L97524 Non-pressure chronic ulcer of other part of left foot with necrosis of bone: Secondary | ICD-10-CM

## 2021-10-18 NOTE — Progress Notes (Signed)
°  Subjective:  Patient ID: [name], female    DOB: 10/28/1985,  MRN: [id number]  Chief Complaint  Patient presents with   Routine Post Op     POV #2 DOS 09/08/2021 TRANSMETATARSAL AMPUTATION & ACHILLES TENDON LENGTHENING LT FOOT    36 y.o. female returns for post-op check.  Still doing dressing changes at home.  Has not been able to get dressing supplies because physical therapy is still coming.  Review of Systems: Negative except as noted in the HPI. Denies N/V/F/Ch.   Objective:  There were no vitals filed for this visit. There is no height or weight on file to calculate BMI. Constitutional Well developed. Well nourished.  Vascular Foot warm and well perfused. Capillary refill normal to all digits.   Neurologic Normal speech. Oriented to person, place, and time. Epicritic sensation to light touch grossly present bilaterally.  Dermatologic Development of superficial and deep dehiscence with exposed subcutaneous layer, no exposed bone tendon or joint no malodor or purulence or cellulitis  Orthopedic: Tenderness to palpation noted about the surgical site.   Multiple view plain film radiographs: Status post transmetatarsal amputation  Culture with staph epidermidis and Candida species and corynebacterium  Pathology results show osteomyelitis with clean resection margins Assessment:   1. Ulcer of left foot with necrosis of bone ([place])    Plan:  Patient was evaluated and treated and all questions answered.  S/p foot surgery left -Ordered discontinue therapy given we have access to [facility] also gave the patient and her mother a copy. -New order for wound care supplies at present given.  I debrided the incision today of the nonviable tissue and it is full-thickness in nature -Unfortunately this incision does not appear to be healing either.  There is a strong chance that this does not work.  We will continue local wound care for now and I will follow-up with her in 2  weeks  No follow-ups on file.

## 2021-10-19 ENCOUNTER — Ambulatory Visit: Payer: Medicare Other | Admitting: Podiatry

## 2021-10-20 ENCOUNTER — Encounter: Payer: BLUE CROSS/BLUE SHIELD | Admitting: Podiatry

## 2021-10-20 ENCOUNTER — Encounter: Payer: Medicare Other | Admitting: Podiatry

## 2021-10-27 ENCOUNTER — Encounter: Payer: Medicare Other | Admitting: Podiatry

## 2021-11-01 ENCOUNTER — Ambulatory Visit: Payer: Medicare Other | Admitting: Surgery

## 2021-11-09 ENCOUNTER — Other Ambulatory Visit: Payer: Self-pay

## 2021-11-09 ENCOUNTER — Ambulatory Visit (INDEPENDENT_AMBULATORY_CARE_PROVIDER_SITE_OTHER): Payer: Medicare Other | Admitting: Podiatry

## 2021-11-09 ENCOUNTER — Telehealth (INDEPENDENT_AMBULATORY_CARE_PROVIDER_SITE_OTHER): Payer: Self-pay

## 2021-11-09 ENCOUNTER — Ambulatory Visit (INDEPENDENT_AMBULATORY_CARE_PROVIDER_SITE_OTHER): Payer: Medicare Other | Admitting: Vascular Surgery

## 2021-11-09 ENCOUNTER — Encounter (INDEPENDENT_AMBULATORY_CARE_PROVIDER_SITE_OTHER): Payer: Self-pay | Admitting: Vascular Surgery

## 2021-11-09 ENCOUNTER — Ambulatory Visit (INDEPENDENT_AMBULATORY_CARE_PROVIDER_SITE_OTHER): Payer: Medicare Other

## 2021-11-09 ENCOUNTER — Encounter: Payer: Self-pay | Admitting: Podiatry

## 2021-11-09 VITALS — BP 130/88 | HR 99 | Resp 18 | Ht 66.0 in | Wt 298.0 lb

## 2021-11-09 DIAGNOSIS — I10 Essential (primary) hypertension: Secondary | ICD-10-CM | POA: Diagnosis not present

## 2021-11-09 DIAGNOSIS — Z9862 Peripheral vascular angioplasty status: Secondary | ICD-10-CM | POA: Diagnosis not present

## 2021-11-09 DIAGNOSIS — I7025 Atherosclerosis of native arteries of other extremities with ulceration: Secondary | ICD-10-CM

## 2021-11-09 DIAGNOSIS — L97524 Non-pressure chronic ulcer of other part of left foot with necrosis of bone: Secondary | ICD-10-CM

## 2021-11-09 DIAGNOSIS — N186 End stage renal disease: Secondary | ICD-10-CM | POA: Diagnosis not present

## 2021-11-09 NOTE — Assessment & Plan Note (Addendum)
Access working well. Treated for steal with ligation of her fistula. Now with peritoneal dialysis catheter. This does impair wound healing.

## 2021-11-09 NOTE — H&P (View-Only) (Signed)
MRN : [id number]  [name] is a 36 y.o. (Mar 16, 1986) female who presents with chief complaint of  Chief Complaint  Patient presents with   Follow-up    ultrasound  .  History of Present Illness: Patient returns today in follow up of her PAD.  She has persistent ulceration on her left foot despite previous surgical resection and transmetatarsal amputation.  Her podiatry was contacted yesterday and was worried about her wound not progressing.  She has had multiple interventions to both lower extremities.  Currently, she does not have any ulcers on the right foot.  Her ABIs today are 0.78 on the right and 0.72 on the left, but the waveforms are quite poor and this may be false elevation.  Her digit pressure is only 11 on the right and she does not have digits on the left. She has also been treated for steal syndrome.  Her fistula was ligated and her arm is much better.  She is using her peritoneal dialysis catheter.  Current Outpatient Medications  Medication Sig Dispense Refill   amLODipine (NORVASC) 10 MG tablet Take 10 mg by mouth daily.     aspirin EC 81 MG EC tablet Take 1 tablet (81 mg total) by mouth daily. 30 tablet 2   atorvastatin (LIPITOR) 20 MG tablet Take 20 mg by mouth daily.     calcium acetate (PHOSLO) 667 MG capsule Take 1,334 mg by mouth 3 (three) times daily before meals.     carvedilol (COREG) 25 MG tablet Take 25 mg by mouth 2 (two) times daily with a meal.     cilostazol (PLETAL) 100 MG tablet Take 1 tablet (100 mg total) by mouth 2 (two) times daily before a meal. 60 tablet 11   cinacalcet (SENSIPAR) 30 MG tablet SMARTSIG:1 Tablet(s) By Mouth Every Evening     cloNIDine (CATAPRES) 0.2 MG tablet Take 0.2 mg by mouth 3 (three) times daily.     clopidogrel (PLAVIX) 75 MG tablet Take 1 tablet (75 mg total) by mouth daily. 30 tablet 11   isosorbide mononitrate (IMDUR) 30 MG 24 hr tablet Take 30 mg by mouth 2 (two) times daily.     KLOR-CON M20 20 MEQ tablet Take 20  mEq by mouth daily.     lactulose (CHRONULAC) 10 GM/15ML solution Take 30 mLs (20 g total) by mouth 2 (two) times daily as needed for mild constipation. 900 mL 3   losartan (COZAAR) 100 MG tablet Take 100 mg by mouth daily.     multivitamin (RENA-VIT) TABS tablet Take 1 tablet by mouth at bedtime. 30 tablet 0   torsemide (DEMADEX) 100 MG tablet Take 100 mg by mouth every morning.     traZODone (DESYREL) 100 MG tablet Take 100 mg by mouth at bedtime.     vitamin C (VITAMIN C) 500 MG tablet Take 1 tablet (500 mg total) by mouth 2 (two) times daily. 60 tablet 1   doxycycline (VIBRA-TABS) 100 MG tablet Take 1 tablet (100 mg total) by mouth 2 (two) times daily. (Patient not taking: Reported on 11/09/2021) 28 tablet 0   HYDROcodone-acetaminophen (NORCO/VICODIN) 5-325 MG tablet Take one to two tablets every six to eight hours as needed for pain. (Patient not taking: Reported on 11/09/2021) 20 tablet 0   ondansetron (ZOFRAN) 4 MG tablet Take 1 tablet (4 mg total) by mouth every 6 (six) hours as needed for nausea. (Patient not taking: Reported on 11/09/2021) 20 tablet 0   Vitamin D, Ergocalciferol, (DRISDOL)  1.25 MG (50000 UNIT) CAPS capsule Take 50,000 Units by mouth once a week.     No current facility-administered medications for this visit.    Past Medical History:  Diagnosis Date   Anemia of chronic disease    Asthma    Calciphylaxis 2020   CHF (congestive heart failure) (HCC)    CKD (chronic kidney disease), stage III ([place])    Diabetes ([place])    type 2   Hypertension    Morbid obesity [facility])     Past Surgical History:  Procedure Laterality Date   A/V FISTULAGRAM Left 10/29/2020   Procedure: A/V FISTULAGRAM;  Surgeon: Algernon Huxley, MD;  Location: [place] [facility];  Service: Cardiovascular;  Laterality: Left;   APPLICATION OF WOUND VAC Bilateral 09/15/2019   Procedure: APPLICATION OF WOUND VAC;  Surgeon: Benjamine Sprague, DO;  Location: [facility];  Service: General;  Laterality: Bilateral;   AV  FISTULA PLACEMENT Left 07/09/2020   Procedure: ARTERIOVENOUS (AV) FISTULA CREATION ( BRACHIAL CEPHALIC);  Surgeon: Algernon Huxley, MD;  Location: [facility];  Service: Vascular;  Laterality: Left;   DIALYSIS/PERMA CATHETER INSERTION N/A 02/28/2019   Procedure: DIALYSIS/PERMA CATHETER INSERTION;  Surgeon: Algernon Huxley, MD;  Location: [place] [facility];  Service: Cardiovascular;  Laterality: N/A;   DIALYSIS/PERMA CATHETER INSERTION N/A 09/17/2019   Procedure: DIALYSIS/PERMA CATHETER EXCHANGE;  Surgeon: Katha Cabal, MD;  Location: [place] [facility];  Service: Cardiovascular;  Laterality: N/A;   DIALYSIS/PERMA CATHETER REMOVAL N/A 03/18/2020   Procedure: DIALYSIS/PERMA CATHETER REMOVAL;  Surgeon: Algernon Huxley, MD;  Location: [place] [facility];  Service: Cardiovascular;  Laterality: N/A;   LIGATION OF ARTERIOVENOUS  FISTULA Left 06/03/2021   Procedure: LIGATION OF ARTERIOVENOUS  FISTULA;  Surgeon: Algernon Huxley, MD;  Location: [facility];  Service: Vascular;  Laterality: Left;   LOWER EXTREMITY ANGIOGRAPHY Left 12/02/2020   Procedure: LOWER EXTREMITY ANGIOGRAPHY;  Surgeon: Algernon Huxley, MD;  Location: [place] [facility];  Service: Cardiovascular;  Laterality: Left;   LOWER EXTREMITY ANGIOGRAPHY Right 01/25/2021   Procedure: LOWER EXTREMITY ANGIOGRAPHY;  Surgeon: Algernon Huxley, MD;  Location: [place] [facility];  Service: Cardiovascular;  Laterality: Right;   LOWER EXTREMITY ANGIOGRAPHY Left 04/15/2021   Procedure: LOWER EXTREMITY ANGIOGRAPHY;  Surgeon: Algernon Huxley, MD;  Location: [place] [facility];  Service: Cardiovascular;  Laterality: Left;   LOWER EXTREMITY ANGIOGRAPHY Right 05/20/2021   Procedure: LOWER EXTREMITY ANGIOGRAPHY;  Surgeon: Algernon Huxley, MD;  Location: [place] [facility];  Service: Cardiovascular;  Laterality: Right;   LOWER EXTREMITY ANGIOGRAPHY Left 05/27/2021   Procedure: LOWER EXTREMITY ANGIOGRAPHY;  Surgeon: Algernon Huxley, MD;  Location: [place] [facility];  Service:  Cardiovascular;  Laterality: Left;   LOWER EXTREMITY ANGIOGRAPHY Right 08/04/2021   Procedure: LOWER EXTREMITY ANGIOGRAPHY;  Surgeon: Algernon Huxley, MD;  Location: [place] [facility];  Service: Cardiovascular;  Laterality: Right;   pd cath  05/21/2019   TENDON LENGTHENING Left 09/08/2021   Procedure: TENDON ACHILLES LENGTHENING;  Surgeon: Criselda Peaches, DPM;  Location: [facility];  Service: Podiatry;  Laterality: Left;   TONSILLECTOMY     TRANSMETATARSAL AMPUTATION Left 09/08/2021   Procedure: TRANSMETATARSAL AMPUTATION;  Surgeon: Criselda Peaches, DPM;  Location: [facility];  Service: Podiatry;  Laterality: Left;   UPPER EXTREMITY ANGIOGRAPHY Left 04/15/2021   Procedure: UPPER EXTREMITY ANGIOGRAPHY;  Surgeon: Algernon Huxley, MD;  Location: [place] [facility];  Service: Cardiovascular;  Laterality: Left;  WOUND DEBRIDEMENT Bilateral 09/15/2019   Procedure: DEBRIDEMENT WOUND;  Surgeon: Benjamine Sprague, DO;  Location: [facility];  Service: General;  Laterality: Bilateral;     Social History   Tobacco Use   Smoking status: Former    Types: Cigars   Smokeless tobacco: Never  Vaping Use   Vaping Use: Never used  Substance Use Topics   Alcohol use: No    Alcohol/week: 0.0 standard drinks   Drug use: No       Family History  Problem Relation Age of Onset   Hypertension Mother    Multiple sclerosis Mother    CAD Mother    Cancer Maternal Grandmother        Ovarian    Cancer Maternal Grandfather        Prostate    Kidney disease Father      Allergies  Allergen Reactions   Percocet [Oxycodone-Acetaminophen] Itching   Tomato Itching   Gabapentin Other (See Comments)    Extremity tremors     REVIEW OF SYSTEMS (Negative unless checked)  Constitutional: [] Weight loss  [] Fever  [] Chills Cardiac: [] Chest pain   [] Chest pressure   [] Palpitations   [] Shortness of breath when laying flat   [] Shortness of breath at rest   [] Shortness of breath with exertion. Vascular:  [] Pain in legs  with walking   [] Pain in legs at rest   [] Pain in legs when laying flat   [] Claudication   [] Pain in feet when walking  [] Pain in feet at rest  [] Pain in feet when laying flat   [] History of DVT   [] Phlebitis   [x] Swelling in legs   [] Varicose veins   [x] Non-healing ulcers Pulmonary:   [] Uses home oxygen   [] Productive cough   [] Hemoptysis   [] Wheeze  [] COPD   [] Asthma Neurologic:  [] Dizziness  [] Blackouts   [] Seizures   [] History of stroke   [] History of TIA  [] Aphasia   [] Temporary blindness   [] Dysphagia   [] Weakness or numbness in arms   [] Weakness or numbness in legs Musculoskeletal:  [] Arthritis   [] Joint swelling   [] Joint pain   [] Low back pain Hematologic:  [] Easy bruising  [] Easy bleeding   [] Hypercoagulable state   [x] Anemic   Gastrointestinal:  [] Blood in stool   [] Vomiting blood  [x] Gastroesophageal reflux/heartburn   [] Abdominal pain Genitourinary:  [x] Chronic kidney disease   [] Difficult urination  [] Frequent urination  [] Burning with urination   [] Hematuria Skin:  [] Rashes   [x] Ulcers   [x] Wounds Psychological:  [] History of anxiety   []  History of major depression.  Physical Examination  BP 130/88 (BP Location: Right Arm)    Pulse 99    Resp 18    Ht 5\' 6"  (1.676 m)    Wt 298 lb (135.2 kg)    BMI 48.10 kg/m  Gen:  WD/WN, NAD.  Morbidly obese Head: /AT, No temporalis wasting. Ear/Nose/Throat: Hearing grossly intact, nares w/o erythema or drainage Eyes: Conjunctiva clear. Sclera non-icteric Neck: Supple.  Trachea midline Pulmonary:  Good air movement, no use of accessory muscles.  Cardiac: RRR, no JVD Vascular:  Vessel Right Left  Radial Palpable Palpable                          PT Not palpable Not palpable  DP Not palpable Not palpable   Gastrointestinal: soft, non-tender/non-distended.  PD catheter in place Musculoskeletal: M/S 5/5 throughout.  No deformity or atrophy.  Left foot wound is currently dressed.  Mild  bilateral lower extremity edema. Neurologic:  Sensation grossly intact in extremities.  Symmetrical.  Speech is fluent.  Psychiatric: Judgment intact, Mood & affect appropriate for pt's clinical situation. Dermatologic: Left foot wound is dressed      Labs Recent Results (from the past 2160 hour(s))  Basic metabolic panel per protocol     Status: Abnormal   Collection Time: 09/07/21  3:05 PM  Result Value Ref Range   Sodium 134 (L) 135 - 145 mmol/L   Potassium 3.1 (L) 3.5 - 5.1 mmol/L   Chloride 95 (L) 98 - 111 mmol/L   CO2 24 22 - 32 mmol/L   Glucose, Bld 100 (H) 70 - 99 mg/dL    Comment: Glucose reference range applies only to samples taken after fasting for at least 8 hours.   BUN 52 (H) 6 - 20 mg/dL   Creatinine, Ser 12.37 (H) 0.44 - 1.00 mg/dL   Calcium 8.5 (L) 8.9 - 10.3 mg/dL   GFR, Estimated 4 (L) >60 mL/min    Comment: (NOTE) Calculated using the CKD-EPI Creatinine Equation (2021)    Anion gap 15 5 - 15    Comment: Performed at [facility], [address] [place], [place] [address]  CBC per protocol     Status: Abnormal   Collection Time: 09/07/21  3:05 PM  Result Value Ref Range   WBC 21.1 (H) 4.0 - 10.5 K/uL   RBC 3.46 (L) 3.87 - 5.11 MIL/uL   Hemoglobin 10.8 (L) 12.0 - 15.0 g/dL   HCT 33.5 (L) 36.0 - 46.0 %   MCV 96.8 80.0 - 100.0 fL   MCH 31.2 26.0 - 34.0 pg   MCHC 32.2 30.0 - 36.0 g/dL   RDW 16.7 (H) 11.5 - 15.5 %   Platelets 262 150 - 400 K/uL   nRBC 0.0 0.0 - 0.2 %    Comment: Performed at [facility], [address] [place], [place] [address]  I-STAT, [place] 8     Status: Abnormal   Collection Time: 09/08/21  2:18 PM  Result Value Ref Range   Sodium 136 135 - 145 mmol/L   Potassium 4.6 3.5 - 5.1 mmol/L   Chloride 98 98 - 111 mmol/L   BUN 79 (H) 6 - 20 mg/dL   Creatinine, Ser 15.10 (H) 0.44 - 1.00 mg/dL   Glucose, Bld 89 70 - 99 mg/dL    Comment: Glucose reference range applies only to samples taken after fasting for at least 8 hours.   Calcium, Ion 0.93 (L) 1.15 - 1.40  mmol/L   TCO2 26 22 - 32 mmol/L   Hemoglobin 12.2 12.0 - 15.0 g/dL   HCT 36.0 36.0 - 46.0 %  Surgical pathology     Status: None   Collection Time: 09/08/21  4:51 PM  Result Value Ref Range   SURGICAL PATHOLOGY      SURGICAL PATHOLOGY CASE: [id number] PATIENT: Aoife Feuerborn Surgical Pathology Report     Specimen Submitted: A. 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 End-stage renal disease, type 2 diabetes, previous calciphylaxis     DIAGNOSIS: A. FOOT, LEFT; TRANSMETATARSAL AMPUTATION: - ULCERATION INVOLVING AREA OF SECOND, THIRD, AND FOURTH METATARSAL HEADS. - SOFT TISSUE NECROSIS INVOLVING AREA OF SECOND METATARSAL HEAD. - OSTEOMYELITIS OF SECOND, THIRD, AND FOURTH METATARSAL HEADS. - PROXIMAL BONE MARGINS (FIRST THROUGH FIFTH METATARSALS) ARE NEGATIVE FOR OSTEOMYELITIS. - PROXIMAL SOFT TISSUE MARGIN APPEARS VIABLE. - CALCINOSIS CUTIS  ASSOCIATED WITH CHRONIC RENAL FAILURE, WITHOUT HISTOLOGIC EVIDENCE OF CALCIPHYLAXIS.  GROSS DESCRIPTION: A. Labeled: Left foot bone (per requisition evaluate resection margins for osteomyelitis) Received: Fresh Collection time: 4:51 PM on 1 11/08/2020 Placed into formalin time: 5:41 PM on 09/08/2021 Size: 10.9 x 9.9 x 3.1 cm Description of lesion(s): Received is a left transmetatarsal amputation specimen.  The foot has a great toe with a toenail.  The remaining toes have previously been amputated.  At the prior amputation site there is a 5.5 x 1 cm area of ulceration with surrounding sloughing skin.  The remaining skin is tan to brown and finely wrinkled. Proximal margin: The proximal margin is grossly viable and is inked blue.  The resection margin displays tan-brown finely wrinkled skin, tan soft tissue, and tan firm cut bone.  The area of ulceration is 1.3 cm to the closest skin and soft tissue resection margins. Bone: The second, third,  fourth, and fifth metatarsals underlying the ulceration have softened bone.  The bone of the second is fractured. Other findings: None grossly appreciated.  Block summary: 1 - resection margin, first metatarsal, perpendicularly sectioned and submitted entirely 2 - res ection margin, second metatarsal, perpendicularly sectioned and submitted entirely 3 - resection margin, third metatarsal, perpendicularly sectioned and submitted entirely 4 - resection margin, fourth metatarsal, perpendicularly sectioned and submitted entirely 5 - resection margin, fifth metatarsal, perpendicularly sectioned and submitted entirely 6 - representative skin and soft tissue resection margins closest to ulceration, perpendicularly sectioned 7 - representative ulceration and softened metatarsal head underlying ulceration 8 - representative third metatarsal underlying ulceration 9 - representative fourth metatarsal underlying ulceration 10 - representative fifth metatarsal underlying ulceration  Tissue decalcification: Cassette 1, 2, 3, 4, 5, 7, 8, 9, and 10  RB 09/10/2021   Final Diagnosis performed by Bryan Lemma, MD.   Electronically signed 09/15/2021 2:11:59PM The electronic signature indicates that the named Attending Pathologist has evaluated the  specimen Technical component performed at [facility], [address], [place], [place] [address] Lab: [phone] Dir: Rush Farmer, MD, MMM  Professional component performed at [facility], [facility], [place], [place], [place] [address] Lab: [phone] Dir: Kathi Simpers, MD   Aerobic/Anaerobic Culture w Gram Stain (surgical/deep wound)     Status: None   Collection Time: 09/08/21  4:51 PM   Specimen: PATH Other; Tissue  Result Value Ref Range   Specimen Description TISSUE LEFT FOOT    Special Requests BONE    Gram Stain      FEW WBC PRESENT,BOTH PMN AND MONONUCLEAR RARE GRAM POSITIVE COCCI RARE GRAM VARIABLE ROD     Culture      FEW STAPHYLOCOCCUS EPIDERMIDIS MODERATE CORYNEBACTERIUM STRIATUM Standardized susceptibility testing for this organism is not available. RARE CANDIDA PARAPSILOSIS NO ANAEROBES ISOLATED Performed at [facility], [place] [address] [place], [place] [address]    Report Status 09/14/2021 FINAL    Organism ID, Bacteria STAPHYLOCOCCUS EPIDERMIDIS       Susceptibility   Staphylococcus epidermidis - MIC*    CIPROFLOXACIN <=0.5 SENSITIVE Sensitive     ERYTHROMYCIN <=0.25 SENSITIVE Sensitive     GENTAMICIN <=0.5 SENSITIVE Sensitive     OXACILLIN <=0.25 SENSITIVE Sensitive     TETRACYCLINE <=1 SENSITIVE Sensitive     VANCOMYCIN 1 SENSITIVE Sensitive     TRIMETH/SULFA <=10 SENSITIVE Sensitive     CLINDAMYCIN <=0.25 SENSITIVE Sensitive     RIFAMPIN <=0.5 SENSITIVE Sensitive     Inducible Clindamycin NEGATIVE Sensitive     *  FEW STAPHYLOCOCCUS EPIDERMIDIS  Glucose, capillary     Status: None   Collection Time: 09/08/21  6:00 PM  Result Value Ref Range   Glucose-Capillary 80 70 - 99 mg/dL    Comment: Glucose reference range applies only to samples taken after fasting for at least 8 hours.   Comment 1 Notify RN    Comment 2 Document in Chart     Radiology No results found.  Assessment/Plan  Atherosclerosis of native arteries of the extremities with ulceration (HCC) ABIs today are reduced at 0.7 bilaterally with toe pressures of 11 on the right.  This is a critical and limb threatening situation and I believe these ABIs are falsely elevated given her very poor waveforms.  At this point, her left lower extremity revascularization released angiogram for evaluation is indicated.  I have discussed that she may have small vessel disease that is not a mid able to revascularization.  We will determine that time of the procedure.  Risks and benefits were discussed and she is agreeable to proceed  Essential hypertension blood pressure control important in reducing the progression of  atherosclerotic disease. On appropriate oral medications.   ESRD (end stage renal disease) ([place]) Access working well. Treated for steal with ligation of her fistula. Now with peritoneal dialysis catheter. This does impair wound healing.    [name], MD  11/09/2021 3:21 PM    This note was created with Dragon medical transcription system.  Any errors from dictation are purely unintentional

## 2021-11-09 NOTE — Progress Notes (Signed)
MRN : [id number]  [name] is a 36 y.o. (January 04, 1986) female who presents with chief complaint of  Chief Complaint  Patient presents with   Follow-up    ultrasound  .  History of Present Illness: Patient returns today in follow up of her PAD.  She has persistent ulceration on her left foot despite previous surgical resection and transmetatarsal amputation.  Her podiatry was contacted yesterday and was worried about her wound not progressing.  She has had multiple interventions to both lower extremities.  Currently, she does not have any ulcers on the right foot.  Her ABIs today are 0.78 on the right and 0.72 on the left, but the waveforms are quite poor and this may be false elevation.  Her digit pressure is only 11 on the right and she does not have digits on the left. She has also been treated for steal syndrome.  Her fistula was ligated and her arm is much better.  She is using her peritoneal dialysis catheter.  Current Outpatient Medications  Medication Sig Dispense Refill   amLODipine (NORVASC) 10 MG tablet Take 10 mg by mouth daily.     aspirin EC 81 MG EC tablet Take 1 tablet (81 mg total) by mouth daily. 30 tablet 2   atorvastatin (LIPITOR) 20 MG tablet Take 20 mg by mouth daily.     calcium acetate (PHOSLO) 667 MG capsule Take 1,334 mg by mouth 3 (three) times daily before meals.     carvedilol (COREG) 25 MG tablet Take 25 mg by mouth 2 (two) times daily with a meal.     cilostazol (PLETAL) 100 MG tablet Take 1 tablet (100 mg total) by mouth 2 (two) times daily before a meal. 60 tablet 11   cinacalcet (SENSIPAR) 30 MG tablet SMARTSIG:1 Tablet(s) By Mouth Every Evening     cloNIDine (CATAPRES) 0.2 MG tablet Take 0.2 mg by mouth 3 (three) times daily.     clopidogrel (PLAVIX) 75 MG tablet Take 1 tablet (75 mg total) by mouth daily. 30 tablet 11   isosorbide mononitrate (IMDUR) 30 MG 24 hr tablet Take 30 mg by mouth 2 (two) times daily.     KLOR-CON M20 20 MEQ tablet Take 20  mEq by mouth daily.     lactulose (CHRONULAC) 10 GM/15ML solution Take 30 mLs (20 g total) by mouth 2 (two) times daily as needed for mild constipation. 900 mL 3   losartan (COZAAR) 100 MG tablet Take 100 mg by mouth daily.     multivitamin (RENA-VIT) TABS tablet Take 1 tablet by mouth at bedtime. 30 tablet 0   torsemide (DEMADEX) 100 MG tablet Take 100 mg by mouth every morning.     traZODone (DESYREL) 100 MG tablet Take 100 mg by mouth at bedtime.     vitamin C (VITAMIN C) 500 MG tablet Take 1 tablet (500 mg total) by mouth 2 (two) times daily. 60 tablet 1   doxycycline (VIBRA-TABS) 100 MG tablet Take 1 tablet (100 mg total) by mouth 2 (two) times daily. (Patient not taking: Reported on 11/09/2021) 28 tablet 0   HYDROcodone-acetaminophen (NORCO/VICODIN) 5-325 MG tablet Take one to two tablets every six to eight hours as needed for pain. (Patient not taking: Reported on 11/09/2021) 20 tablet 0   ondansetron (ZOFRAN) 4 MG tablet Take 1 tablet (4 mg total) by mouth every 6 (six) hours as needed for nausea. (Patient not taking: Reported on 11/09/2021) 20 tablet 0   Vitamin D, Ergocalciferol, (DRISDOL)  1.25 MG (50000 UNIT) CAPS capsule Take 50,000 Units by mouth once a week.     No current facility-administered medications for this visit.    Past Medical History:  Diagnosis Date   Anemia of chronic disease    Asthma    Calciphylaxis 2020   CHF (congestive heart failure) (HCC)    CKD (chronic kidney disease), stage III ([place])    Diabetes ([place])    type 2   Hypertension    Morbid obesity [facility])     Past Surgical History:  Procedure Laterality Date   A/V FISTULAGRAM Left 10/29/2020   Procedure: A/V FISTULAGRAM;  Surgeon: Algernon Huxley, MD;  Location: [place] [facility];  Service: Cardiovascular;  Laterality: Left;   APPLICATION OF WOUND VAC Bilateral 09/15/2019   Procedure: APPLICATION OF WOUND VAC;  Surgeon: Benjamine Sprague, DO;  Location: [facility];  Service: General;  Laterality: Bilateral;   AV  FISTULA PLACEMENT Left 07/09/2020   Procedure: ARTERIOVENOUS (AV) FISTULA CREATION ( BRACHIAL CEPHALIC);  Surgeon: Algernon Huxley, MD;  Location: [facility];  Service: Vascular;  Laterality: Left;   DIALYSIS/PERMA CATHETER INSERTION N/A 02/28/2019   Procedure: DIALYSIS/PERMA CATHETER INSERTION;  Surgeon: Algernon Huxley, MD;  Location: [place] [facility];  Service: Cardiovascular;  Laterality: N/A;   DIALYSIS/PERMA CATHETER INSERTION N/A 09/17/2019   Procedure: DIALYSIS/PERMA CATHETER EXCHANGE;  Surgeon: Katha Cabal, MD;  Location: [place] [facility];  Service: Cardiovascular;  Laterality: N/A;   DIALYSIS/PERMA CATHETER REMOVAL N/A 03/18/2020   Procedure: DIALYSIS/PERMA CATHETER REMOVAL;  Surgeon: Algernon Huxley, MD;  Location: [place] [facility];  Service: Cardiovascular;  Laterality: N/A;   LIGATION OF ARTERIOVENOUS  FISTULA Left 06/03/2021   Procedure: LIGATION OF ARTERIOVENOUS  FISTULA;  Surgeon: Algernon Huxley, MD;  Location: [facility];  Service: Vascular;  Laterality: Left;   LOWER EXTREMITY ANGIOGRAPHY Left 12/02/2020   Procedure: LOWER EXTREMITY ANGIOGRAPHY;  Surgeon: Algernon Huxley, MD;  Location: [place] [facility];  Service: Cardiovascular;  Laterality: Left;   LOWER EXTREMITY ANGIOGRAPHY Right 01/25/2021   Procedure: LOWER EXTREMITY ANGIOGRAPHY;  Surgeon: Algernon Huxley, MD;  Location: [place] [facility];  Service: Cardiovascular;  Laterality: Right;   LOWER EXTREMITY ANGIOGRAPHY Left 04/15/2021   Procedure: LOWER EXTREMITY ANGIOGRAPHY;  Surgeon: Algernon Huxley, MD;  Location: [place] [facility];  Service: Cardiovascular;  Laterality: Left;   LOWER EXTREMITY ANGIOGRAPHY Right 05/20/2021   Procedure: LOWER EXTREMITY ANGIOGRAPHY;  Surgeon: Algernon Huxley, MD;  Location: [place] [facility];  Service: Cardiovascular;  Laterality: Right;   LOWER EXTREMITY ANGIOGRAPHY Left 05/27/2021   Procedure: LOWER EXTREMITY ANGIOGRAPHY;  Surgeon: Algernon Huxley, MD;  Location: [place] [facility];  Service:  Cardiovascular;  Laterality: Left;   LOWER EXTREMITY ANGIOGRAPHY Right 08/04/2021   Procedure: LOWER EXTREMITY ANGIOGRAPHY;  Surgeon: Algernon Huxley, MD;  Location: [place] [facility];  Service: Cardiovascular;  Laterality: Right;   pd cath  05/21/2019   TENDON LENGTHENING Left 09/08/2021   Procedure: TENDON ACHILLES LENGTHENING;  Surgeon: Criselda Peaches, DPM;  Location: [facility];  Service: Podiatry;  Laterality: Left;   TONSILLECTOMY     TRANSMETATARSAL AMPUTATION Left 09/08/2021   Procedure: TRANSMETATARSAL AMPUTATION;  Surgeon: Criselda Peaches, DPM;  Location: [facility];  Service: Podiatry;  Laterality: Left;   UPPER EXTREMITY ANGIOGRAPHY Left 04/15/2021   Procedure: UPPER EXTREMITY ANGIOGRAPHY;  Surgeon: Algernon Huxley, MD;  Location: [place] [facility];  Service: Cardiovascular;  Laterality: Left;  WOUND DEBRIDEMENT Bilateral 09/15/2019   Procedure: DEBRIDEMENT WOUND;  Surgeon: Benjamine Sprague, DO;  Location: [facility];  Service: General;  Laterality: Bilateral;     Social History   Tobacco Use   Smoking status: Former    Types: Cigars   Smokeless tobacco: Never  Vaping Use   Vaping Use: Never used  Substance Use Topics   Alcohol use: No    Alcohol/week: 0.0 standard drinks   Drug use: No       Family History  Problem Relation Age of Onset   Hypertension Mother    Multiple sclerosis Mother    CAD Mother    Cancer Maternal Grandmother        Ovarian    Cancer Maternal Grandfather        Prostate    Kidney disease Father      Allergies  Allergen Reactions   Percocet [Oxycodone-Acetaminophen] Itching   Tomato Itching   Gabapentin Other (See Comments)    Extremity tremors     REVIEW OF SYSTEMS (Negative unless checked)  Constitutional: [] Weight loss  [] Fever  [] Chills Cardiac: [] Chest pain   [] Chest pressure   [] Palpitations   [] Shortness of breath when laying flat   [] Shortness of breath at rest   [] Shortness of breath with exertion. Vascular:  [] Pain in legs  with walking   [] Pain in legs at rest   [] Pain in legs when laying flat   [] Claudication   [] Pain in feet when walking  [] Pain in feet at rest  [] Pain in feet when laying flat   [] History of DVT   [] Phlebitis   [x] Swelling in legs   [] Varicose veins   [x] Non-healing ulcers Pulmonary:   [] Uses home oxygen   [] Productive cough   [] Hemoptysis   [] Wheeze  [] COPD   [] Asthma Neurologic:  [] Dizziness  [] Blackouts   [] Seizures   [] History of stroke   [] History of TIA  [] Aphasia   [] Temporary blindness   [] Dysphagia   [] Weakness or numbness in arms   [] Weakness or numbness in legs Musculoskeletal:  [] Arthritis   [] Joint swelling   [] Joint pain   [] Low back pain Hematologic:  [] Easy bruising  [] Easy bleeding   [] Hypercoagulable state   [x] Anemic   Gastrointestinal:  [] Blood in stool   [] Vomiting blood  [x] Gastroesophageal reflux/heartburn   [] Abdominal pain Genitourinary:  [x] Chronic kidney disease   [] Difficult urination  [] Frequent urination  [] Burning with urination   [] Hematuria Skin:  [] Rashes   [x] Ulcers   [x] Wounds Psychological:  [] History of anxiety   []  History of major depression.  Physical Examination  BP 130/88 (BP Location: Right Arm)    Pulse 99    Resp 18    Ht 5\' 6"  (1.676 m)    Wt 298 lb (135.2 kg)    BMI 48.10 kg/m  Gen:  WD/WN, NAD.  Morbidly obese Head: [place]/AT, No temporalis wasting. Ear/Nose/Throat: Hearing grossly intact, nares w/o erythema or drainage Eyes: Conjunctiva clear. Sclera non-icteric Neck: Supple.  Trachea midline Pulmonary:  Good air movement, no use of accessory muscles.  Cardiac: RRR, no JVD Vascular:  Vessel Right Left  Radial Palpable Palpable                          PT Not palpable Not palpable  DP Not palpable Not palpable   Gastrointestinal: soft, non-tender/non-distended.  PD catheter in place Musculoskeletal: M/S 5/5 throughout.  No deformity or atrophy.  Left foot wound is currently dressed.  Mild  bilateral lower extremity edema. Neurologic:  Sensation grossly intact in extremities.  Symmetrical.  Speech is fluent.  Psychiatric: Judgment intact, Mood & affect appropriate for pt's clinical situation. Dermatologic: Left foot wound is dressed      Labs Recent Results (from the past 2160 hour(s))  Basic metabolic panel per protocol     Status: Abnormal   Collection Time: 09/07/21  3:05 PM  Result Value Ref Range   Sodium 134 (L) 135 - 145 mmol/L   Potassium 3.1 (L) 3.5 - 5.1 mmol/L   Chloride 95 (L) 98 - 111 mmol/L   CO2 24 22 - 32 mmol/L   Glucose, Bld 100 (H) 70 - 99 mg/dL    Comment: Glucose reference range applies only to samples taken after fasting for at least 8 hours.   BUN 52 (H) 6 - 20 mg/dL   Creatinine, Ser 12.37 (H) 0.44 - 1.00 mg/dL   Calcium 8.5 (L) 8.9 - 10.3 mg/dL   GFR, Estimated 4 (L) >60 mL/min    Comment: (NOTE) Calculated using the CKD-EPI Creatinine Equation (2021)    Anion gap 15 5 - 15    Comment: Performed at [facility], [address] [place], [place] [address]  CBC per protocol     Status: Abnormal   Collection Time: 09/07/21  3:05 PM  Result Value Ref Range   WBC 21.1 (H) 4.0 - 10.5 K/uL   RBC 3.46 (L) 3.87 - 5.11 MIL/uL   Hemoglobin 10.8 (L) 12.0 - 15.0 g/dL   HCT 33.5 (L) 36.0 - 46.0 %   MCV 96.8 80.0 - 100.0 fL   MCH 31.2 26.0 - 34.0 pg   MCHC 32.2 30.0 - 36.0 g/dL   RDW 16.7 (H) 11.5 - 15.5 %   Platelets 262 150 - 400 K/uL   nRBC 0.0 0.0 - 0.2 %    Comment: Performed at [facility], [address] [place],  [address]  I-STAT, [place] 8     Status: Abnormal   Collection Time: 09/08/21  2:18 PM  Result Value Ref Range   Sodium 136 135 - 145 mmol/L   Potassium 4.6 3.5 - 5.1 mmol/L   Chloride 98 98 - 111 mmol/L   BUN 79 (H) 6 - 20 mg/dL   Creatinine, Ser 15.10 (H) 0.44 - 1.00 mg/dL   Glucose, Bld 89 70 - 99 mg/dL    Comment: Glucose reference range applies only to samples taken after fasting for at least 8 hours.   Calcium, Ion 0.93 (L) 1.15 - 1.40  mmol/L   TCO2 26 22 - 32 mmol/L   Hemoglobin 12.2 12.0 - 15.0 g/dL   HCT 36.0 36.0 - 46.0 %  Surgical pathology     Status: None   Collection Time: 09/08/21  4:51 PM  Result Value Ref Range   SURGICAL PATHOLOGY      SURGICAL PATHOLOGY CASE: [id number] PATIENT: Nate Sanjose Surgical Pathology Report     Specimen Submitted: A. 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 End-stage renal disease, type 2 diabetes, previous calciphylaxis     DIAGNOSIS: A. FOOT, LEFT; TRANSMETATARSAL AMPUTATION: - ULCERATION INVOLVING AREA OF SECOND, THIRD, AND FOURTH METATARSAL HEADS. - SOFT TISSUE NECROSIS INVOLVING AREA OF SECOND METATARSAL HEAD. - OSTEOMYELITIS OF SECOND, THIRD, AND FOURTH METATARSAL HEADS. - PROXIMAL BONE MARGINS (FIRST THROUGH FIFTH METATARSALS) ARE NEGATIVE FOR OSTEOMYELITIS. - PROXIMAL SOFT TISSUE MARGIN APPEARS VIABLE. - CALCINOSIS CUTIS  ASSOCIATED WITH CHRONIC RENAL FAILURE, WITHOUT HISTOLOGIC EVIDENCE OF CALCIPHYLAXIS.  GROSS DESCRIPTION: A. Labeled: Left foot bone (per requisition evaluate resection margins for osteomyelitis) Received: Fresh Collection time: 4:51 PM on 1 11/08/2020 Placed into formalin time: 5:41 PM on 09/08/2021 Size: 10.9 x 9.9 x 3.1 cm Description of lesion(s): Received is a left transmetatarsal amputation specimen.  The foot has a great toe with a toenail.  The remaining toes have previously been amputated.  At the prior amputation site there is a 5.5 x 1 cm area of ulceration with surrounding sloughing skin.  The remaining skin is tan to brown and finely wrinkled. Proximal margin: The proximal margin is grossly viable and is inked blue.  The resection margin displays tan-brown finely wrinkled skin, tan soft tissue, and tan firm cut bone.  The area of ulceration is 1.3 cm to the closest skin and soft tissue resection margins. Bone: The second, third,  fourth, and fifth metatarsals underlying the ulceration have softened bone.  The bone of the second is fractured. Other findings: None grossly appreciated.  Block summary: 1 - resection margin, first metatarsal, perpendicularly sectioned and submitted entirely 2 - res ection margin, second metatarsal, perpendicularly sectioned and submitted entirely 3 - resection margin, third metatarsal, perpendicularly sectioned and submitted entirely 4 - resection margin, fourth metatarsal, perpendicularly sectioned and submitted entirely 5 - resection margin, fifth metatarsal, perpendicularly sectioned and submitted entirely 6 - representative skin and soft tissue resection margins closest to ulceration, perpendicularly sectioned 7 - representative ulceration and softened metatarsal head underlying ulceration 8 - representative third metatarsal underlying ulceration 9 - representative fourth metatarsal underlying ulceration 10 - representative fifth metatarsal underlying ulceration  Tissue decalcification: Cassette 1, 2, 3, 4, 5, 7, 8, 9, and 10  RB 09/10/2021   Final Diagnosis performed by Bryan Lemma, MD.   Electronically signed 09/15/2021 2:11:59PM The electronic signature indicates that the named Attending Pathologist has evaluated the  specimen Technical component performed at [facility], [address], [place], [place] [address] Lab: [phone] Dir: Rush Farmer, MD, MMM  Professional component performed at [facility], [facility], [place], [place], [place] [address] Lab: [phone] Dir: Kathi Simpers, MD   Aerobic/Anaerobic Culture w Gram Stain (surgical/deep wound)     Status: None   Collection Time: 09/08/21  4:51 PM   Specimen: PATH Other; Tissue  Result Value Ref Range   Specimen Description TISSUE LEFT FOOT    Special Requests BONE    Gram Stain      FEW WBC PRESENT,BOTH PMN AND MONONUCLEAR RARE GRAM POSITIVE COCCI RARE GRAM VARIABLE ROD     Culture      FEW STAPHYLOCOCCUS EPIDERMIDIS MODERATE CORYNEBACTERIUM STRIATUM Standardized susceptibility testing for this organism is not available. RARE CANDIDA PARAPSILOSIS NO ANAEROBES ISOLATED Performed at [facility], [place] [address] [place], [place] [address]    Report Status 09/14/2021 FINAL    Organism ID, Bacteria STAPHYLOCOCCUS EPIDERMIDIS       Susceptibility   Staphylococcus epidermidis - MIC*    CIPROFLOXACIN <=0.5 SENSITIVE Sensitive     ERYTHROMYCIN <=0.25 SENSITIVE Sensitive     GENTAMICIN <=0.5 SENSITIVE Sensitive     OXACILLIN <=0.25 SENSITIVE Sensitive     TETRACYCLINE <=1 SENSITIVE Sensitive     VANCOMYCIN 1 SENSITIVE Sensitive     TRIMETH/SULFA <=10 SENSITIVE Sensitive     CLINDAMYCIN <=0.25 SENSITIVE Sensitive     RIFAMPIN <=0.5 SENSITIVE Sensitive     Inducible Clindamycin NEGATIVE Sensitive     *  FEW STAPHYLOCOCCUS EPIDERMIDIS  Glucose, capillary     Status: None   Collection Time: 09/08/21  6:00 PM  Result Value Ref Range   Glucose-Capillary 80 70 - 99 mg/dL    Comment: Glucose reference range applies only to samples taken after fasting for at least 8 hours.   Comment 1 Notify RN    Comment 2 Document in Chart     Radiology No results found.  Assessment/Plan  Atherosclerosis of native arteries of the extremities with ulceration (HCC) ABIs today are reduced at 0.7 bilaterally with toe pressures of 11 on the right.  This is a critical and limb threatening situation and I believe these ABIs are falsely elevated given her very poor waveforms.  At this point, her left lower extremity revascularization released angiogram for evaluation is indicated.  I have discussed that she may have small vessel disease that is not a mid able to revascularization.  We will determine that time of the procedure.  Risks and benefits were discussed and she is agreeable to proceed  Essential hypertension blood pressure control important in reducing the progression of  atherosclerotic disease. On appropriate oral medications.   ESRD (end stage renal disease) ([place]) Access working well. Treated for steal with ligation of her fistula. Now with peritoneal dialysis catheter. This does impair wound healing.    [name], MD  11/09/2021 3:21 PM    This note was created with Dragon medical transcription system.  Any errors from dictation are purely unintentional

## 2021-11-09 NOTE — Telephone Encounter (Signed)
Spoke with the patient and she is scheduled for a left leg angio on 11/15/21 with a 10:15 am arrival time to the MM. Pre-procedure instructions were discussed and will be mailed.

## 2021-11-09 NOTE — Assessment & Plan Note (Addendum)
ABIs today are reduced at 0.7 bilaterally with toe pressures of 11 on the right.  This is a critical and limb threatening situation and I believe these ABIs are falsely elevated given her very poor waveforms.  At this point, her left lower extremity revascularization released angiogram for evaluation is indicated.  I have discussed that she may have small vessel disease that is not a mid able to revascularization.  We will determine that time of the procedure.  Risks and benefits were discussed and she is agreeable to proceed

## 2021-11-09 NOTE — Assessment & Plan Note (Signed)
blood pressure control important in reducing the progression of atherosclerotic disease. On appropriate oral medications.  

## 2021-11-15 ENCOUNTER — Other Ambulatory Visit: Payer: Self-pay

## 2021-11-15 ENCOUNTER — Other Ambulatory Visit (INDEPENDENT_AMBULATORY_CARE_PROVIDER_SITE_OTHER): Payer: Self-pay | Admitting: Nurse Practitioner

## 2021-11-15 ENCOUNTER — Ambulatory Visit
Admission: RE | Admit: 2021-11-15 | Discharge: 2021-11-15 | Disposition: A | Discharge status: Home / Self Care | Payer: Medicare Other | Attending: Vascular Surgery | Admitting: Vascular Surgery

## 2021-11-15 ENCOUNTER — Ambulatory Visit: Payer: Medicare Other | Admitting: Surgery

## 2021-11-15 ENCOUNTER — Encounter
Admission: RE | Disposition: A | Discharge status: Home / Self Care | Payer: Self-pay | Source: Home / Self Care | Attending: Vascular Surgery

## 2021-11-15 DIAGNOSIS — Z87891 Personal history of nicotine dependence: Secondary | ICD-10-CM | POA: Diagnosis not present

## 2021-11-15 DIAGNOSIS — E1122 Type 2 diabetes mellitus with diabetic chronic kidney disease: Secondary | ICD-10-CM | POA: Diagnosis not present

## 2021-11-15 DIAGNOSIS — I70244 Atherosclerosis of native arteries of left leg with ulceration of heel and midfoot: Secondary | ICD-10-CM | POA: Diagnosis not present

## 2021-11-15 DIAGNOSIS — I132 Hypertensive heart and chronic kidney disease with heart failure and with stage 5 chronic kidney disease, or end stage renal disease: Secondary | ICD-10-CM | POA: Insufficient documentation

## 2021-11-15 DIAGNOSIS — E1151 Type 2 diabetes mellitus with diabetic peripheral angiopathy without gangrene: Secondary | ICD-10-CM | POA: Insufficient documentation

## 2021-11-15 DIAGNOSIS — I70245 Atherosclerosis of native arteries of left leg with ulceration of other part of foot: Secondary | ICD-10-CM | POA: Insufficient documentation

## 2021-11-15 DIAGNOSIS — N186 End stage renal disease: Secondary | ICD-10-CM | POA: Insufficient documentation

## 2021-11-15 DIAGNOSIS — L97529 Non-pressure chronic ulcer of other part of left foot with unspecified severity: Secondary | ICD-10-CM | POA: Diagnosis not present

## 2021-11-15 DIAGNOSIS — I70211 Atherosclerosis of native arteries of extremities with intermittent claudication, right leg: Secondary | ICD-10-CM | POA: Diagnosis not present

## 2021-11-15 DIAGNOSIS — E11621 Type 2 diabetes mellitus with foot ulcer: Secondary | ICD-10-CM | POA: Insufficient documentation

## 2021-11-15 DIAGNOSIS — Z992 Dependence on renal dialysis: Secondary | ICD-10-CM | POA: Diagnosis not present

## 2021-11-15 DIAGNOSIS — I70234 Atherosclerosis of native arteries of right leg with ulceration of heel and midfoot: Secondary | ICD-10-CM

## 2021-11-15 DIAGNOSIS — I70299 Other atherosclerosis of native arteries of extremities, unspecified extremity: Secondary | ICD-10-CM

## 2021-11-15 HISTORY — PX: LOWER EXTREMITY ANGIOGRAPHY: CATH118251

## 2021-11-15 LAB — POTASSIUM (ARMC VASCULAR LAB ONLY): Potassium (ARMC vascular lab): 3 — ABNORMAL LOW (ref 3.5–5.1)

## 2021-11-15 LAB — GLUCOSE, CAPILLARY
Glucose-Capillary: 95 mg/dL (ref 70–99)
Glucose-Capillary: 74 mg/dL (ref 70–99)

## 2021-11-15 SURGERY — LOWER EXTREMITY ANGIOGRAPHY
Anesthesia: Moderate Sedation | Laterality: Left

## 2021-11-15 MED ORDER — ATORVASTATIN CALCIUM 10 MG PO TABS: 10.0000 mg | ORAL_TABLET | Freq: Every day | ORAL | Status: DC

## 2021-11-15 MED ORDER — FENTANYL CITRATE PF 50 MCG/ML IJ SOSY
PREFILLED_SYRINGE | INTRAMUSCULAR | Status: AC
  Filled 2021-11-15: qty 2

## 2021-11-15 MED ORDER — MIDAZOLAM HCL 2 MG/2ML IJ SOLN
INTRAMUSCULAR | Status: AC
  Filled 2021-11-15: qty 4

## 2021-11-15 MED ORDER — SODIUM CHLORIDE 0.9 % IV SOLN: INTRAVENOUS | Status: DC

## 2021-11-15 MED ORDER — SODIUM CHLORIDE 0.9% FLUSH: 3.0000 mL | Freq: Two times a day (BID) | INTRAVENOUS | Status: DC

## 2021-11-15 MED ORDER — HEPARIN SODIUM (PORCINE) 1000 UNIT/ML IJ SOLN
INTRAMUSCULAR | Status: AC
  Filled 2021-11-15: qty 10

## 2021-11-15 MED ORDER — FAMOTIDINE 20 MG PO TABS: 40.0000 mg | ORAL_TABLET | Freq: Once | ORAL | Status: DC | PRN

## 2021-11-15 MED ORDER — METHYLPREDNISOLONE SODIUM SUCC 125 MG IJ SOLR: 125.0000 mg | Freq: Once | INTRAMUSCULAR | Status: DC | PRN

## 2021-11-15 MED ORDER — DIPHENHYDRAMINE HCL 50 MG/ML IJ SOLN: 50.0000 mg | Freq: Once | INTRAMUSCULAR | Status: DC | PRN

## 2021-11-15 MED ORDER — HYDROMORPHONE HCL 1 MG/ML IJ SOLN: 1.0000 mg | Freq: Once | INTRAMUSCULAR | Status: AC

## 2021-11-15 MED ORDER — MIDAZOLAM HCL 2 MG/2ML IJ SOLN
INTRAMUSCULAR | Status: DC | PRN
  Administered 2021-11-15 (×3): .5 mg via INTRAVENOUS
  Administered 2021-11-15: 2 mg via INTRAVENOUS

## 2021-11-15 MED ORDER — ONDANSETRON HCL 4 MG/2ML IJ SOLN: 4.0000 mg | Freq: Four times a day (QID) | INTRAMUSCULAR | Status: DC | PRN

## 2021-11-15 MED ORDER — HYDRALAZINE HCL 20 MG/ML IJ SOLN: 5.0000 mg | INTRAMUSCULAR | Status: DC | PRN

## 2021-11-15 MED ORDER — ATORVASTATIN CALCIUM 20 MG PO TABS
20.0000 mg | ORAL_TABLET | Freq: Every day | ORAL | 5 refills | Status: AC
Start: 2021-11-15 — End: ?

## 2021-11-15 MED ORDER — HEPARIN SODIUM (PORCINE) 1000 UNIT/ML IJ SOLN
INTRAMUSCULAR | Status: DC | PRN
  Administered 2021-11-15: 2000 [IU] via INTRAVENOUS
  Administered 2021-11-15: 5000 [IU] via INTRAVENOUS

## 2021-11-15 MED ORDER — HYDROMORPHONE HCL 1 MG/ML IJ SOLN
INTRAMUSCULAR | Status: AC
  Administered 2021-11-15: 0.5 mg via INTRAVENOUS
  Filled 2021-11-15: qty 1

## 2021-11-15 MED ORDER — MIDAZOLAM HCL 2 MG/ML PO SYRP: 8.0000 mg | ORAL_SOLUTION | Freq: Once | ORAL | Status: DC | PRN

## 2021-11-15 MED ORDER — LABETALOL HCL 5 MG/ML IV SOLN: 10.0000 mg | INTRAVENOUS | Status: DC | PRN

## 2021-11-15 MED ORDER — FENTANYL CITRATE (PF) 100 MCG/2ML IJ SOLN
INTRAMUSCULAR | Status: DC | PRN
  Administered 2021-11-15 (×2): 25 ug via INTRAVENOUS
  Administered 2021-11-15: 50 ug via INTRAVENOUS

## 2021-11-15 MED ORDER — SODIUM CHLORIDE 0.9 % IV SOLN: 250.0000 mL | INTRAVENOUS | Status: DC | PRN

## 2021-11-15 MED ORDER — DIPHENHYDRAMINE HCL 50 MG/ML IJ SOLN
INTRAMUSCULAR | Status: DC | PRN
  Administered 2021-11-15: 25 mg via INTRAVENOUS

## 2021-11-15 MED ORDER — CLOPIDOGREL BISULFATE 75 MG PO TABS: 75.0000 mg | ORAL_TABLET | Freq: Every day | ORAL | 11 refills | Status: AC

## 2021-11-15 MED ORDER — IODIXANOL 320 MG/ML IV SOLN
INTRAVENOUS | Status: DC | PRN
  Administered 2021-11-15: 80 mL via INTRA_ARTERIAL

## 2021-11-15 MED ORDER — FENTANYL CITRATE PF 50 MCG/ML IJ SOSY: 12.5000 ug | PREFILLED_SYRINGE | Freq: Once | INTRAMUSCULAR | Status: DC | PRN

## 2021-11-15 MED ORDER — SODIUM CHLORIDE 0.9% FLUSH: 3.0000 mL | INTRAVENOUS | Status: DC | PRN

## 2021-11-15 MED ORDER — DIPHENHYDRAMINE HCL 50 MG/ML IJ SOLN
INTRAMUSCULAR | Status: AC
  Filled 2021-11-15: qty 1

## 2021-11-15 MED ORDER — ACETAMINOPHEN 325 MG PO TABS: 650.0000 mg | ORAL_TABLET | ORAL | Status: DC | PRN

## 2021-11-15 MED ORDER — CLOPIDOGREL BISULFATE 75 MG PO TABS: 75.0000 mg | ORAL_TABLET | Freq: Every day | ORAL | Status: DC

## 2021-11-15 MED ORDER — CEFAZOLIN SODIUM-DEXTROSE 1-4 GM/50ML-% IV SOLN
1.0000 g | Freq: Once | INTRAVENOUS | Status: AC
  Administered 2021-11-15: 1 g via INTRAVENOUS

## 2021-11-15 SURGICAL SUPPLY — 31 items
BALLN DORADO 10X40X80 (BALLOONS) ×2
BALLN DORADO 8X40X80 (BALLOONS) ×2
BALLN LUTONIX 7X80X130 (BALLOONS) ×2
BALLN LUTONIX DCB 7X40X130 (BALLOONS) ×2
BALLN ULTRVRSE 2X300X150 (BALLOONS) ×2
BALLN ULTRVRSE 2X300X150 OTW (BALLOONS) ×1
BALLN ULTRVRSE 3X150X150 (BALLOONS) ×2
BALLOON DORADO 10X40X80 (BALLOONS) IMPLANT
BALLOON DORADO 8X40X80 (BALLOONS) IMPLANT
BALLOON LUTONIX 7X80X130 (BALLOONS) IMPLANT
BALLOON LUTONIX DCB 7X40X130 (BALLOONS) IMPLANT
BALLOON ULTRVRSE 2X300X150 OTW (BALLOONS) IMPLANT
BALLOON ULTRVRSE 3X150X150 (BALLOONS) IMPLANT
CATH ANGIO 5F PIGTAIL 65CM (CATHETERS) ×1 IMPLANT
CATH BEACON 5 .038 100 VERT TP (CATHETERS) ×1 IMPLANT
CATH NAVICROSS ANGLED 135CM (MICROCATHETER) ×1 IMPLANT
CATH ROTAREX 135 6FR (CATHETERS) ×1 IMPLANT
COVER PROBE U/S 5X48 (MISCELLANEOUS) ×1 IMPLANT
DEVICE STARCLOSE SE CLOSURE (Vascular Products) ×1 IMPLANT
GAUZE SPONGE 4X4 12PLY STRL (GAUZE/BANDAGES/DRESSINGS) ×3 IMPLANT
GLIDEWIRE ADV .035X260CM (WIRE) ×1 IMPLANT
KIT ENCORE 26 ADVANTAGE (KITS) ×1 IMPLANT
PACK ANGIOGRAPHY (CUSTOM PROCEDURE TRAY) ×2 IMPLANT
SHEATH BRITE TIP 5FRX11 (SHEATH) ×1 IMPLANT
SHEATH FLEXOR ANSEL2 7FRX45 (SHEATH) ×1 IMPLANT
STENT LIFESTREAM 9X38X80 (Permanent Stent) ×1 IMPLANT
STENT VIABAHN 8X7.5X120 (Permanent Stent) ×1 IMPLANT
SYR MEDRAD MARK 7 150ML (SYRINGE) ×1 IMPLANT
TUBING CONTRAST HIGH PRESS 72 (TUBING) ×1 IMPLANT
WIRE G V18X300CM (WIRE) ×1 IMPLANT
WIRE GUIDERIGHT .035X150 (WIRE) ×1 IMPLANT

## 2021-11-15 NOTE — OR Nursing (Signed)
Pt denies taking plavix and took 81 mg aspirin a week ago. Seond and third toe on rigt foot blue. No dopplerable pulse right DP, PT or Left PT, faint intermittant DP pulse left. Groin pulses very faint palpation. Dr [name] notified. PD catheter intact.

## 2021-11-15 NOTE — Interval H&P Note (Signed)
History and Physical Interval Note:  11/15/2021 11:43 AM  [name]  has presented today for surgery, with the diagnosis of LLE Angio  BARD   ASO w ulceration.  The various methods of treatment have been discussed with the patient and family. After consideration of risks, benefits and other options for treatment, the patient has consented to  Procedure(s): LOWER EXTREMITY ANGIOGRAPHY (Left) as a surgical intervention.  The patient's history has been reviewed, patient examined, no change in status, stable for surgery.  I have reviewed the patient's chart and labs.  Questions were answered to the patient's satisfaction.     [name]

## 2021-11-15 NOTE — Op Note (Signed)
[facility]  Percutaneous Study/Intervention Procedural Note   Date of Surgery: 11/15/2021  Surgeon(s):Benno Brensinger    Assistants:none  Pre-operative Diagnosis: PAD with ulceration bilateral lower extremities  Post-operative diagnosis:  Same  Procedure(s) Performed:             1.  Ultrasound guidance for vascular access right femoral artery             2.  Catheter placement into left common femoral artery from right femoral approach             3.  Aortogram and selective left lower extremity angiogram             4.  Percutaneous transluminal angioplasty of right distal common and proximal external iliac artery with 7 mm diameter Lutonix drug-coated angioplasty balloon             5.  Percutaneous transluminal angioplasty of the left posterior tibial artery with 2 mm diameter angioplasty balloon throughout and 3 mm diameter angioplasty balloon in the proximal segment  6.  Mechanical thrombectomy to the left SFA to debulk chronic thrombus with a highly thrombotic short segment lesion with the [place] Rex device  7.  Viabahn covered stent placement to the left SFA with 8 mm diameter by 7.5 cm length stent  8.  Stent placement to the right distal common and proximal external iliac artery with 9 mm diameter by 38 mm length lifestream stent             9.  StarClose closure device right femoral artery  EBL: 50 cc  Contrast: 80 cc  Fluoro Time: 10.5 minutes  Moderate Conscious Sedation Time: approximately 56 minutes using 3.5 mg of Versed and 100 mcg of Fentanyl              Indications:  Patient is a 36 y.o.female with a long history of peripheral arterial disease with multiple previous interventions as well as transmetatarsal amputation on the left and digital amputation on the right which are poorly healing. The patient has noninvasive study showing Doose perfusion bilaterally the patient apparently stopped taking her antiplatelet therapy. The patient is brought in  for angiography for further evaluation and potential treatment.  Due to the limb threatening nature of the situation, angiogram was performed for attempted limb salvage. The patient is aware that if the procedure fails, amputation would be expected.  The patient also understands that even with successful revascularization, amputation may still be required due to the severity of the situation.  Risks and benefits are discussed and informed consent is obtained.   Procedure:  The patient was identified and appropriate procedural time out was performed.  The patient was then placed supine on the table and prepped and draped in the usual sterile fashion. Moderate conscious sedation was administered during a face to face encounter with the patient throughout the procedure with my supervision of the RN administering medicines and monitoring the patient's vital signs, pulse oximetry, telemetry and mental status throughout from the start of the procedure until the patient was taken to the recovery room. Ultrasound was used to evaluate the right common femoral artery.  It was patent .  A digital ultrasound image was acquired.  A Seldinger needle was used to access the right common femoral artery under direct ultrasound guidance and a permanent image was performed.  A 0.035 J wire was advanced without resistance and a 5Fr sheath was placed.  Pigtail catheter was placed into the  aorta and an AP aortogram was performed. This demonstrated normal renal arteries and normal aorta, but there was a high-grade calcific and somewhat thrombotic stenosis in the right distal common iliac artery and proximal external iliac artery.  The left common and external iliac arteries were patent. I then crossed the aortic bifurcation and advanced to the left femoral head. Selective left lower extremity angiogram was then performed.  There was what appeared to be an AV fistula between the profunda femoris arterial branch and the profundus femoris  vein.  This did not appear to impair the distal flow much, but there was a high-grade thrombotic lesion in the mid SFA creating greater than 90% stenosis.  The vessel then normalized in the mid SFA below this lesion and was fairly normal through the popliteal artery but the tibial vessels were heavily diseased.  The posterior tibial artery was the best option, but it had multiple areas of short segment occlusion or high-grade stenosis in the proximal and mid segments.  The peroneal and anterior tibial arteries were heavily diseased and did not appear to have much flow distally. It was felt that it was in the patient's best interest to proceed with intervention after these images to avoid a second procedure and a larger amount of contrast and fluoroscopy based off of the findings from the initial angiogram. The patient was systemically heparinized and I initially started by performing angioplasty of the right distal common iliac artery and proximal external iliac artery with a 7 mm diameter by 4 cm length Lutonix drug-coated angioplasty balloon inflated to 10 atm for 1 minute.  Completion imaging showed greater than 50% residual stenosis, and we would stent this on the way out.  A 7 [place] Ansell sheath was then placed over the [employer] wire. I then used a Kumpe catheter and the advantage wire to easily navigate through the SFA lesion and confirm intraluminal flow in the popliteal artery.  I then exchanged for a V 18 wire and cross the stenosis and/or occlusion in the posterior tibial artery and tibioperoneal trunk parking a wire in the foot.  Rota Rex mechanical thrombectomy device was then used to reduce the chronic thrombus in the left SFA lesion which was clearly thrombotic.  2 passes were made in this area.  Following thrombectomy, there remained high-grade residual stenosis with some degree of residual thrombus so I elected to place an 8 mm diameter by 7.5 cm length Viabahn stent and postdilated this  with a 7 mm diameter Lutonix drug-coated angioplasty balloon.  An 8 mm high-pressure balloon was actually needed within the stent as well.  Following this, less than 10% residual stenosis was identified.  The tibial lesion was then addressed with a 2 mm diameter by 30 cm length angioplasty balloon from the mid to distal posterior tibial artery into the tibioperoneal trunk.  The proximal posterior tibial artery and tibioperoneal trunk were also addressed with a 3 mm diameter by 15 cm length angioplasty balloon inflated to 8 atm for 1 minute.  Completion imaging showed about a 20 to 25% residual stenosis in the tibioperoneal trunk and posterior tibial artery but this did not appear flow-limiting and was much improved.  We had improve the perfusion to the left leg is much as possible at this point, and then we turned our attention to the residual lesion in the right iliac.  A 9 mm diameter by 38 mm length lifestream stent was then deployed in the right distal common iliac artery and proximal  external iliac artery.  This is postdilated with a 10 mm balloon with excellent angiographic completion result and less than 10% residual stenosis. I elected to terminate the procedure. The sheath was removed and StarClose closure device was deployed in the right femoral artery with excellent hemostatic result. The patient was taken to the recovery room in stable condition having tolerated the procedure well.  Findings:               Aortogram:  This demonstrated normal renal arteries and normal aorta, but there was a high-grade calcific and somewhat thrombotic stenosis in the right distal common iliac artery and proximal external iliac artery.  The left common and external iliac arteries were patent.             Left lower Extremity: There was what appeared to be an AV fistula between the profunda femoris arterial branch and the profundus femoris vein.  This did not appear to impair the distal flow much, but there was a  high-grade thrombotic lesion in the mid SFA creating greater than 90% stenosis.  The vessel then normalized in the mid SFA below this lesion and was fairly normal through the popliteal artery but the tibial vessels were heavily diseased.  The posterior tibial artery was the best option, but it had multiple areas of short segment occlusion or high-grade stenosis in the proximal and mid segments.  The peroneal and anterior tibial arteries were heavily diseased and did not appear to have much flow distally   Disposition: Patient was taken to the recovery room in stable condition having tolerated the procedure well.  Complications: None  [name] 11/15/2021 1:17 PM   This note was created with Dragon Medical transcription system. Any errors in dictation are purely unintentional.

## 2021-11-16 ENCOUNTER — Encounter: Payer: Self-pay | Admitting: Vascular Surgery

## 2021-11-16 NOTE — Progress Notes (Signed)
°  Subjective:  Patient ID: [name], female    DOB: [birth year],  MRN: [id number]  Chief Complaint  Patient presents with   Routine Post Op     POV #3 DOS 09/08/2021 TRANSMETATARSAL AMPUTATION & ACHILLES TENDON LENGTHENING LT FOOT    36 y.o. female returns for post-op check.  Still doing dressing changes at home.  She wanted get it evaluated make sure that there was nothing else going on.  She is known to Dr. Lynnell Catalan.  She has been doing some dressing changes.  She has angiogram with Dr. [name]  Review of Systems: Negative except as noted in the HPI. Denies N/V/F/Ch.   Objective:  There were no vitals filed for this visit. There is no height or weight on file to calculate BMI. Constitutional Well developed. Well nourished.  Vascular Foot cool to touch. Status post transmetatarsal amputation  Neurologic Normal speech. Oriented to person, place, and time. Epicritic sensation to light touch grossly present bilaterally.  Dermatologic Development of superficial and deep dehiscence with exposed subcutaneous layer, no exposed bone tendon or joint no malodor or purulence or cellulitis  Orthopedic: Tenderness to palpation noted about the surgical site.   Multiple view plain film radiographs: Status post transmetatarsal amputation  Culture with staph epidermidis and Candida species and corynebacterium  Pathology results show osteomyelitis with clean resection margins Assessment:   No diagnosis found.  Plan:  Patient was evaluated and treated and all questions answered.  S/p foot surgery left -Continue doing Betadine wet-to-dry dressing as there is some maceration present. -There is some wound complication that is present no exposed bone noted. -She will follow-up with Dr. Sherryle Lis -She is scheduled with Dr. [name] to undergo further angiogram to restore blood flow.  No follow-ups on file.

## 2021-11-22 ENCOUNTER — Ambulatory Visit (INDEPENDENT_AMBULATORY_CARE_PROVIDER_SITE_OTHER): Payer: Medicare Other | Admitting: Podiatry

## 2021-11-22 ENCOUNTER — Encounter: Payer: Self-pay | Admitting: Podiatry

## 2021-11-22 ENCOUNTER — Other Ambulatory Visit: Payer: Self-pay

## 2021-11-22 VITALS — Temp 97.5°F

## 2021-11-22 DIAGNOSIS — L97524 Non-pressure chronic ulcer of other part of left foot with necrosis of bone: Secondary | ICD-10-CM

## 2021-11-22 NOTE — Progress Notes (Signed)
°  Subjective:  Patient ID: [name], female    DOB: 1985/11/03,  MRN: [id number]  Chief Complaint  Patient presents with   Routine Post Op    "It's about the same."    36 y.o. female returns for follow-up of chronic left foot ulceration.  She underwent angiography with Dr. [name] on 11/15/2021.  Still very painful for her.  Review of Systems: Negative except as noted in the HPI. Denies N/V/F/Ch.   Objective:   Vitals:   11/22/21 1457  Temp: (!) 97.5 F (36.4 C)   There is no height or weight on file to calculate BMI. Constitutional Well developed. Well nourished.  Vascular Foot warm and well perfused. Capillary refill normal to all digits.   Neurologic Normal speech. Oriented to person, place, and time. Epicritic sensation to light touch grossly present bilaterally.  Dermatologic Left foot wound dehiscence with necrotic eschar measuring 10 cm x 3 cm x 1 cm across anterior incision of transmetatarsal amputation site.  Necrotic eschar 1 cm in diameter lateral left foot.  No purulence or malodor today.  Orthopedic: Tenderness to palpation noted about the surgical site.   Multiple view plain film radiographs: Status post transmetatarsal amputation  Culture with staph epidermidis and Candida species and corynebacterium  Pathology results show osteomyelitis with clean resection margins  Angiography 11/15/2021 shows interval improvement in the flow via PTA Assessment:   No diagnosis found.  Plan:  Patient was evaluated and treated and all questions answered.  S/p foot surgery left -Unfortunately has not healed much since I last saw her on 10/13/2021.  I discussed with her that I think the likelihood of healing in the foot at this point even with multiple interventions has not been successful.  Her last chance of saving her foot would be debridement of the nonviable tissue and application of a skin substitute.  I discussed with her if we proceed this we need to keep her off  of this foot completely for period of time necessary to heal this.  I think she is not able to complete this at home and I would recommend that we admit her postoperatively for physical therapy and likely admission to subacute rehab or SNF following surgery.  She will be completely nonweightbearing on the left foot with a wound VAC and skin substitute on the foot.  I also discussed with her that there is a high likelihood of this procedure failing still and she may face amputation.  I do not think further amputation within the foot will be a reasonable attempt at limb salvage.  If this does not work then she will likely require below-knee amputation.  She understands and wishes to proceed.  Surgery will be scheduled for this Friday, February 10.   Surgical plan:  Procedure: -Left foot debridement of nonviable tissue and removal of eschar for preparation for skin substitute, application of skin substitute and wound VAC  Location: -[facility]  Anesthesia plan: -General anesthesia   WB Restrictions / DME needs: -Complete NWB left lower extremity   No follow-ups on file.

## 2021-11-23 ENCOUNTER — Other Ambulatory Visit: Payer: Self-pay

## 2021-11-23 ENCOUNTER — Encounter
Admission: RE | Admit: 2021-11-23 | Discharge: 2021-11-23 | Disposition: A | Discharge status: Home / Self Care | Payer: Medicare Other | Source: Ambulatory Visit | Attending: Podiatry | Admitting: Podiatry

## 2021-11-23 DIAGNOSIS — D631 Anemia in chronic kidney disease: Secondary | ICD-10-CM

## 2021-11-23 DIAGNOSIS — N186 End stage renal disease: Secondary | ICD-10-CM

## 2021-11-23 DIAGNOSIS — E119 Type 2 diabetes mellitus without complications: Secondary | ICD-10-CM

## 2021-11-23 DIAGNOSIS — I10 Essential (primary) hypertension: Secondary | ICD-10-CM

## 2021-11-23 HISTORY — DX: Nephrotic syndrome with unspecified morphologic changes: N04.9

## 2021-11-23 NOTE — Patient Instructions (Signed)
Your procedure is scheduled on:11/20/2021 Friday Report to the Registration Desk on the 1st floor of the [facility].Then proceed to the 2nd floor Surgery Desk in the [facility] To find out your arrival time, please call [phone] between 1PM - 3PM on:11-25-21 Thursday  REMEMBER: Instructions that are not followed completely may result in serious medical risk, up to and including death; or upon the discretion of your surgeon and anesthesiologist your surgery may need to be rescheduled.  Do not eat food after midnight the night before surgery.  No gum chewing, lozengers or hard candies.  You may however, drink Water up to 2 hours before you are scheduled to arrive for your surgery. Do not drink anything within 2 hours of your scheduled arrival time.  Type 1 and Type 2 diabetics should only drink water.  Do NOT take any medication the day of surgery  Call Dr Vivia Ewing office 11-24-21 to see about when you need to stop your aspirin EC 81 MG EC tablet, cilostazol (PLETAL), and clopidogrel (PLAVIX)  One week prior to surgery: Stop Anti-inflammatories (NSAIDS) such as Advil, Aleve, Ibuprofen, Motrin, Naproxen, Naprosyn and Aspirin based products such as Excedrin, Goodys Powder, BC Powder.You may however,  take Tylenol if needed for pain up until the day of surgery.  Stop ANY OVER THE COUNTER supplements/vitamins NOW (11-23-21) until after surgery (Continue your Vitamin D, calcium acetate (PHOSLO) and your KLOR-CON up until the day prior to surgery)  No Alcohol for 24 hours before or after surgery.  No Smoking including e-cigarettes for 24 hours prior to surgery.  No chewable tobacco products for at least 6 hours prior to surgery.  No nicotine patches on the day of surgery.  Do not use any "recreational" drugs for at least a week prior to your surgery.  Please be advised that the combination of cocaine and anesthesia may have negative outcomes, up to and including death. If you test positive  for cocaine, your surgery will be cancelled.  On the morning of surgery brush your teeth with toothpaste and water, you may rinse your mouth with mouthwash if you wish. Do not swallow any toothpaste or mouthwash.  Use CHG Soap as directed on instruction sheet.  Do not wear jewelry, make-up, hairpins, clips or nail polish.  Do not wear lotions, powders, or perfumes.   Do not shave body from the neck down 48 hours prior to surgery just in case you cut yourself which could leave a site for infection.  Also, freshly shaved skin may become irritated if using the CHG soap.  Contact lenses, hearing aids and dentures may not be worn into surgery.  Do not bring valuables to the hospital. [facility] is not responsible for any missing/lost belongings or valuables.   Notify your doctor if there is any change in your medical condition (cold, fever, infection).  Wear comfortable clothing (specific to your surgery type) to the hospital.  After surgery, you can help prevent lung complications by doing breathing exercises.  Take deep breaths and cough every 1-2 hours. Your doctor may order a device called an Incentive Spirometer to help you take deep breaths. When coughing or sneezing, hold a pillow firmly against your incision with both hands. This is called splinting. Doing this helps protect your incision. It also decreases belly discomfort.  If you are being admitted to the hospital overnight, leave your suitcase in the car. After surgery it may be brought to your room.  If you are being discharged the  day of surgery, you will not be allowed to drive home. You will need a responsible adult (18 years or older) to drive you home and stay with you that night.   If you are taking public transportation, you will need to have a responsible adult (18 years or older) with you. Please confirm with your physician that it is acceptable to use public transportation.   Please call the [facility]. at [phone] if you have any questions about these instructions.  Surgery Visitation Policy:  Patients undergoing a surgery or procedure may have one family member or support person with them as long as that person is not COVID-19 positive or experiencing its symptoms.  That person may remain in the waiting area during the procedure and may rotate out with other people.  Inpatient Visitation:    Visiting hours are 7 a.m. to 8 p.m. Up to two visitors ages 16+ are allowed at one time in a patient room. The visitors may rotate out with other people during the day. Visitors must check out when they leave, or other visitors will not be allowed. One designated support person may remain overnight. The visitor must pass COVID-19 screenings, use hand sanitizer when entering and exiting the patients room and wear a mask at all times, including in the patients room. Patients must also wear a mask when staff or their visitor are in the room. Masking is required regardless of vaccination status.

## 2021-11-24 ENCOUNTER — Inpatient Hospital Stay
Admission: RE | Admit: 2021-11-24 | Discharge: 2021-11-24 | Disposition: A | Discharge status: Home / Self Care | Payer: Medicare Other | Source: Ambulatory Visit

## 2021-11-24 ENCOUNTER — Other Ambulatory Visit: Admission: RE | Admit: 2021-11-24 | Payer: Medicare Other | Source: Ambulatory Visit

## 2021-11-24 ENCOUNTER — Other Ambulatory Visit
Admission: RE | Admit: 2021-11-24 | Discharge: 2021-11-24 | Disposition: A | Discharge status: Home / Self Care | Payer: Medicare Other | Source: Ambulatory Visit | Attending: Podiatry | Admitting: Podiatry

## 2021-11-24 ENCOUNTER — Telehealth: Payer: Self-pay | Admitting: *Deleted

## 2021-11-24 ENCOUNTER — Encounter: Payer: Self-pay | Admitting: Urgent Care

## 2021-11-24 DIAGNOSIS — I6340 Cerebral infarction due to embolism of unspecified cerebral artery: Secondary | ICD-10-CM | POA: Diagnosis not present

## 2021-11-24 DIAGNOSIS — Z01818 Encounter for other preprocedural examination: Secondary | ICD-10-CM | POA: Insufficient documentation

## 2021-11-24 DIAGNOSIS — I132 Hypertensive heart and chronic kidney disease with heart failure and with stage 5 chronic kidney disease, or end stage renal disease: Secondary | ICD-10-CM | POA: Insufficient documentation

## 2021-11-24 DIAGNOSIS — E119 Type 2 diabetes mellitus without complications: Secondary | ICD-10-CM

## 2021-11-24 DIAGNOSIS — E1122 Type 2 diabetes mellitus with diabetic chronic kidney disease: Secondary | ICD-10-CM | POA: Insufficient documentation

## 2021-11-24 DIAGNOSIS — I509 Heart failure, unspecified: Secondary | ICD-10-CM | POA: Insufficient documentation

## 2021-11-24 DIAGNOSIS — N186 End stage renal disease: Secondary | ICD-10-CM | POA: Insufficient documentation

## 2021-11-24 DIAGNOSIS — R7989 Other specified abnormal findings of blood chemistry: Secondary | ICD-10-CM | POA: Insufficient documentation

## 2021-11-24 DIAGNOSIS — I10 Essential (primary) hypertension: Secondary | ICD-10-CM

## 2021-11-24 DIAGNOSIS — D631 Anemia in chronic kidney disease: Secondary | ICD-10-CM | POA: Insufficient documentation

## 2021-11-24 DIAGNOSIS — I639 Cerebral infarction, unspecified: Secondary | ICD-10-CM | POA: Diagnosis not present

## 2021-11-24 DIAGNOSIS — Z20822 Contact with and (suspected) exposure to covid-19: Secondary | ICD-10-CM

## 2021-11-24 HISTORY — DX: Dyspnea, unspecified: R06.00

## 2021-11-24 HISTORY — DX: Peripheral vascular disease, unspecified: I73.9

## 2021-11-24 LAB — BASIC METABOLIC PANEL
Anion gap: 23 — ABNORMAL HIGH (ref 5–15)
BUN: 69 mg/dL — ABNORMAL HIGH (ref 6–20)
CO2: 19 mmol/L — ABNORMAL LOW (ref 22–32)
Calcium: 7.8 mg/dL — ABNORMAL LOW (ref 8.9–10.3)
Chloride: 94 mmol/L — ABNORMAL LOW (ref 98–111)
Creatinine, Ser: 12.79 mg/dL — ABNORMAL HIGH (ref 0.44–1.00)
GFR, Estimated: 4 mL/min — ABNORMAL LOW (ref >60)
Glucose, Bld: 110 mg/dL — ABNORMAL HIGH (ref 70–99)
Potassium: 3.1 mmol/L — ABNORMAL LOW (ref 3.5–5.1)
Sodium: 136 mmol/L (ref 135–145)

## 2021-11-24 LAB — CBC
HCT: 28 % — ABNORMAL LOW (ref 36.0–46.0)
Hemoglobin: 9.1 g/dL — ABNORMAL LOW (ref 12.0–15.0)
MCH: 31.1 pg (ref 26.0–34.0)
MCHC: 32.5 g/dL (ref 30.0–36.0)
MCV: 95.6 fL (ref 80.0–100.0)
Platelets: 253 10*3/uL (ref 150–400)
RBC: 2.93 MIL/uL — ABNORMAL LOW (ref 3.87–5.11)
RDW: 18 % — ABNORMAL HIGH (ref 11.5–15.5)
WBC: 16.9 10*3/uL — ABNORMAL HIGH (ref 4.0–10.5)
nRBC: 0.2 % (ref 0.0–0.2)

## 2021-11-24 LAB — HEMOGLOBIN A1C
Hgb A1c MFr Bld: 5 % (ref 4.8–5.6)
Mean Plasma Glucose: 96.8 mg/dL

## 2021-11-24 NOTE — Telephone Encounter (Signed)
She can continue it, it's for the stents in her leg and we want them to stay open

## 2021-11-24 NOTE — Telephone Encounter (Signed)
Patient has surgery scheduled for 11/22/2021 and was told to call and ask when should she stop her blood thinner

## 2021-11-24 NOTE — Progress Notes (Signed)
°  [facility] Perioperative Services: Pre-Admission/Anesthesia Testing  Abnormal Lab Notification and Treatment Plan of Care   Date: 11/24/21  Name: [name] MRN:   [id number]  Re: Abnormal labs noted during PAT appointment   Notified:  Provider Name Provider Role Notification Mode  Lanae Crumbly, DPM Podiatry Routed and/or faxed via [place] and Notes:  ABNORMAL LAB VALUE(S): Lab Results  Component Value Date   K 3.1 (L) 11/24/2021   [name] is scheduled for a DEBRIDEMENT OF TISSUE; APPLICATION SKIN SUBSTITUTE; APPLICATION OF WOUND VAC on 12/10/2021. In review of her medication reconciliation, it is noted that the patient is taking prescribed diuretic medications (torsemide). Of note, patient is already taking KLOR-CON 20 mEq daily.  Please note, in efforts to promote a safe and effective anesthetic course, per current guidelines/standards set by the [facility] anesthesia team, the minimal acceptable K+ level for the patient to proceed with general anesthesia is 3.0 mmol/L. With that being said, if the patient drops any lower, her elective procedure will need to be postponed until K+ is better optimized. In efforts to prevent cancellation of planned surgical case, will plan on treating noted electrolyte derangement.  Impression and Plan:  [name] noted to be mildly hypokalemic on preoperative labs.  K+ 3.1 mmol/L.  Patient already taking oral K+ supplement.  Patient contacted to discuss.  We will have patient take an extra 20 mEq dose of her potassium today and tomorrow (40 mEq daily x 2), with plans to take her 20 mEq dose on the morning of surgery. We will plan on rechecking her K+ level on the day of her procedure to ensure that she is safe to proceed. Patient verbalized understanding of the instructions as provided. Note regarding POC sent to primary attending surgery to make him aware  Honor Loh, MSN, APRN, FNP-C,  CEN [facility]  Peri-operative Services Nurse Practitioner Phone: [phone] 11/24/21 1:56 PM  NOTE: This note has been prepared using Dragon dictation software. Despite my best ability to proofread, there is always the potential that unintentional transcriptional errors may still occur from this process.

## 2021-11-24 NOTE — Telephone Encounter (Signed)
[name] called back - [name] relayed information to her about her medications prior to surgery

## 2021-11-25 LAB — SARS CORONAVIRUS 2 (TAT 6-24 HRS): SARS Coronavirus 2: NEGATIVE

## 2021-11-26 ENCOUNTER — Inpatient Hospital Stay: Admission: RE | Admit: 2021-11-26 | Payer: Medicare Other | Source: Home / Self Care | Admitting: Podiatry

## 2021-11-26 ENCOUNTER — Emergency Department: Payer: Medicare Other

## 2021-11-26 ENCOUNTER — Encounter: Payer: Self-pay | Admitting: Certified Registered"

## 2021-11-26 ENCOUNTER — Inpatient Hospital Stay
Admission: EM | Admit: 2021-11-26 | Discharge: 2021-12-15 | DRG: 040 | Disposition: E | Payer: Medicare Other | Attending: Internal Medicine | Admitting: Internal Medicine

## 2021-11-26 ENCOUNTER — Encounter: Admission: EM | Disposition: E | Payer: Self-pay | Source: Home / Self Care | Attending: Internal Medicine

## 2021-11-26 ENCOUNTER — Inpatient Hospital Stay: Payer: Medicare Other

## 2021-11-26 DIAGNOSIS — D631 Anemia in chronic kidney disease: Secondary | ICD-10-CM | POA: Diagnosis not present

## 2021-11-26 DIAGNOSIS — I050 Rheumatic mitral stenosis: Secondary | ICD-10-CM | POA: Diagnosis not present

## 2021-11-26 DIAGNOSIS — A419 Sepsis, unspecified organism: Secondary | ICD-10-CM | POA: Diagnosis not present

## 2021-11-26 DIAGNOSIS — Z7982 Long term (current) use of aspirin: Secondary | ICD-10-CM

## 2021-11-26 DIAGNOSIS — N179 Acute kidney failure, unspecified: Secondary | ICD-10-CM | POA: Diagnosis not present

## 2021-11-26 DIAGNOSIS — I132 Hypertensive heart and chronic kidney disease with heart failure and with stage 5 chronic kidney disease, or end stage renal disease: Secondary | ICD-10-CM | POA: Diagnosis present

## 2021-11-26 DIAGNOSIS — Z79899 Other long term (current) drug therapy: Secondary | ICD-10-CM

## 2021-11-26 DIAGNOSIS — I6340 Cerebral infarction due to embolism of unspecified cerebral artery: Principal | ICD-10-CM

## 2021-11-26 DIAGNOSIS — Z993 Dependence on wheelchair: Secondary | ICD-10-CM

## 2021-11-26 DIAGNOSIS — Z87441 Personal history of nephrotic syndrome: Secondary | ICD-10-CM

## 2021-11-26 DIAGNOSIS — G936 Cerebral edema: Secondary | ICD-10-CM | POA: Diagnosis not present

## 2021-11-26 DIAGNOSIS — Q219 Congenital malformation of cardiac septum, unspecified: Secondary | ICD-10-CM

## 2021-11-26 DIAGNOSIS — Z8249 Family history of ischemic heart disease and other diseases of the circulatory system: Secondary | ICD-10-CM

## 2021-11-26 DIAGNOSIS — N2581 Secondary hyperparathyroidism of renal origin: Secondary | ICD-10-CM | POA: Diagnosis present

## 2021-11-26 DIAGNOSIS — R52 Pain, unspecified: Secondary | ICD-10-CM

## 2021-11-26 DIAGNOSIS — Z7902 Long term (current) use of antithrombotics/antiplatelets: Secondary | ICD-10-CM

## 2021-11-26 DIAGNOSIS — E785 Hyperlipidemia, unspecified: Secondary | ICD-10-CM | POA: Diagnosis present

## 2021-11-26 DIAGNOSIS — I7025 Atherosclerosis of native arteries of other extremities with ulceration: Secondary | ICD-10-CM | POA: Diagnosis not present

## 2021-11-26 DIAGNOSIS — Z978 Presence of other specified devices: Secondary | ICD-10-CM

## 2021-11-26 DIAGNOSIS — J9601 Acute respiratory failure with hypoxia: Secondary | ICD-10-CM | POA: Diagnosis not present

## 2021-11-26 DIAGNOSIS — M86172 Other acute osteomyelitis, left ankle and foot: Secondary | ICD-10-CM | POA: Diagnosis present

## 2021-11-26 DIAGNOSIS — Z66 Do not resuscitate: Secondary | ICD-10-CM | POA: Diagnosis not present

## 2021-11-26 DIAGNOSIS — Z820 Family history of epilepsy and other diseases of the nervous system: Secondary | ICD-10-CM

## 2021-11-26 DIAGNOSIS — I739 Peripheral vascular disease, unspecified: Secondary | ICD-10-CM | POA: Diagnosis not present

## 2021-11-26 DIAGNOSIS — I743 Embolism and thrombosis of arteries of the lower extremities: Secondary | ICD-10-CM | POA: Diagnosis present

## 2021-11-26 DIAGNOSIS — Z8673 Personal history of transient ischemic attack (TIA), and cerebral infarction without residual deficits: Secondary | ICD-10-CM

## 2021-11-26 DIAGNOSIS — E1152 Type 2 diabetes mellitus with diabetic peripheral angiopathy with gangrene: Secondary | ICD-10-CM | POA: Diagnosis present

## 2021-11-26 DIAGNOSIS — I330 Acute and subacute infective endocarditis: Secondary | ICD-10-CM | POA: Diagnosis present

## 2021-11-26 DIAGNOSIS — Z89432 Acquired absence of left foot: Secondary | ICD-10-CM

## 2021-11-26 DIAGNOSIS — E11649 Type 2 diabetes mellitus with hypoglycemia without coma: Secondary | ICD-10-CM | POA: Diagnosis not present

## 2021-11-26 DIAGNOSIS — Z515 Encounter for palliative care: Secondary | ICD-10-CM | POA: Diagnosis not present

## 2021-11-26 DIAGNOSIS — N189 Chronic kidney disease, unspecified: Secondary | ICD-10-CM | POA: Diagnosis present

## 2021-11-26 DIAGNOSIS — R0902 Hypoxemia: Secondary | ICD-10-CM

## 2021-11-26 DIAGNOSIS — N185 Chronic kidney disease, stage 5: Secondary | ICD-10-CM | POA: Diagnosis not present

## 2021-11-26 DIAGNOSIS — D509 Iron deficiency anemia, unspecified: Secondary | ICD-10-CM | POA: Diagnosis present

## 2021-11-26 DIAGNOSIS — I639 Cerebral infarction, unspecified: Secondary | ICD-10-CM | POA: Diagnosis present

## 2021-11-26 DIAGNOSIS — R29810 Facial weakness: Secondary | ICD-10-CM | POA: Diagnosis present

## 2021-11-26 DIAGNOSIS — Z992 Dependence on renal dialysis: Secondary | ICD-10-CM

## 2021-11-26 DIAGNOSIS — I998 Other disorder of circulatory system: Secondary | ICD-10-CM | POA: Diagnosis not present

## 2021-11-26 DIAGNOSIS — G935 Compression of brain: Secondary | ICD-10-CM

## 2021-11-26 DIAGNOSIS — I10 Essential (primary) hypertension: Secondary | ICD-10-CM | POA: Diagnosis not present

## 2021-11-26 DIAGNOSIS — I454 Nonspecific intraventricular block: Secondary | ICD-10-CM | POA: Diagnosis present

## 2021-11-26 DIAGNOSIS — E1122 Type 2 diabetes mellitus with diabetic chronic kidney disease: Secondary | ICD-10-CM | POA: Diagnosis present

## 2021-11-26 DIAGNOSIS — J45909 Unspecified asthma, uncomplicated: Secondary | ICD-10-CM | POA: Diagnosis present

## 2021-11-26 DIAGNOSIS — G9382 Brain death: Secondary | ICD-10-CM | POA: Diagnosis not present

## 2021-11-26 DIAGNOSIS — R4701 Aphasia: Secondary | ICD-10-CM | POA: Diagnosis present

## 2021-11-26 DIAGNOSIS — J9602 Acute respiratory failure with hypercapnia: Secondary | ICD-10-CM | POA: Diagnosis not present

## 2021-11-26 DIAGNOSIS — I63412 Cerebral infarction due to embolism of left middle cerebral artery: Secondary | ICD-10-CM | POA: Diagnosis not present

## 2021-11-26 DIAGNOSIS — R579 Shock, unspecified: Secondary | ICD-10-CM | POA: Diagnosis not present

## 2021-11-26 DIAGNOSIS — D72829 Elevated white blood cell count, unspecified: Secondary | ICD-10-CM | POA: Diagnosis not present

## 2021-11-26 DIAGNOSIS — U071 COVID-19: Secondary | ICD-10-CM | POA: Diagnosis present

## 2021-11-26 DIAGNOSIS — Z452 Encounter for adjustment and management of vascular access device: Secondary | ICD-10-CM

## 2021-11-26 DIAGNOSIS — E872 Acidosis, unspecified: Secondary | ICD-10-CM

## 2021-11-26 DIAGNOSIS — R6521 Severe sepsis with septic shock: Secondary | ICD-10-CM | POA: Diagnosis not present

## 2021-11-26 DIAGNOSIS — G9341 Metabolic encephalopathy: Secondary | ICD-10-CM | POA: Diagnosis present

## 2021-11-26 DIAGNOSIS — Z888 Allergy status to other drugs, medicaments and biological substances status: Secondary | ICD-10-CM

## 2021-11-26 DIAGNOSIS — R29705 NIHSS score 5: Secondary | ICD-10-CM | POA: Diagnosis present

## 2021-11-26 DIAGNOSIS — I6389 Other cerebral infarction: Secondary | ICD-10-CM | POA: Diagnosis not present

## 2021-11-26 DIAGNOSIS — G8191 Hemiplegia, unspecified affecting right dominant side: Secondary | ICD-10-CM | POA: Diagnosis not present

## 2021-11-26 DIAGNOSIS — J1282 Pneumonia due to coronavirus disease 2019: Secondary | ICD-10-CM | POA: Diagnosis present

## 2021-11-26 DIAGNOSIS — Q2112 Patent foramen ovale: Secondary | ICD-10-CM

## 2021-11-26 DIAGNOSIS — I059 Rheumatic mitral valve disease, unspecified: Secondary | ICD-10-CM | POA: Diagnosis not present

## 2021-11-26 DIAGNOSIS — Z01818 Encounter for other preprocedural examination: Secondary | ICD-10-CM

## 2021-11-26 DIAGNOSIS — I96 Gangrene, not elsewhere classified: Secondary | ICD-10-CM | POA: Diagnosis not present

## 2021-11-26 DIAGNOSIS — J939 Pneumothorax, unspecified: Secondary | ICD-10-CM

## 2021-11-26 DIAGNOSIS — Z8041 Family history of malignant neoplasm of ovary: Secondary | ICD-10-CM

## 2021-11-26 DIAGNOSIS — L97526 Non-pressure chronic ulcer of other part of left foot with bone involvement without evidence of necrosis: Secondary | ICD-10-CM | POA: Diagnosis present

## 2021-11-26 DIAGNOSIS — I350 Nonrheumatic aortic (valve) stenosis: Secondary | ICD-10-CM | POA: Diagnosis not present

## 2021-11-26 DIAGNOSIS — N186 End stage renal disease: Secondary | ICD-10-CM | POA: Diagnosis present

## 2021-11-26 DIAGNOSIS — I513 Intracardiac thrombosis, not elsewhere classified: Secondary | ICD-10-CM | POA: Diagnosis present

## 2021-11-26 DIAGNOSIS — G8194 Hemiplegia, unspecified affecting left nondominant side: Secondary | ICD-10-CM | POA: Diagnosis present

## 2021-11-26 DIAGNOSIS — Z885 Allergy status to narcotic agent status: Secondary | ICD-10-CM

## 2021-11-26 DIAGNOSIS — Z8042 Family history of malignant neoplasm of prostate: Secondary | ICD-10-CM

## 2021-11-26 DIAGNOSIS — I5032 Chronic diastolic (congestive) heart failure: Secondary | ICD-10-CM | POA: Diagnosis present

## 2021-11-26 DIAGNOSIS — I63441 Cerebral infarction due to embolism of right cerebellar artery: Secondary | ICD-10-CM | POA: Diagnosis not present

## 2021-11-26 DIAGNOSIS — E876 Hypokalemia: Secondary | ICD-10-CM | POA: Diagnosis present

## 2021-11-26 DIAGNOSIS — Z9911 Dependence on respirator [ventilator] status: Secondary | ICD-10-CM

## 2021-11-26 DIAGNOSIS — E11621 Type 2 diabetes mellitus with foot ulcer: Secondary | ICD-10-CM | POA: Diagnosis present

## 2021-11-26 DIAGNOSIS — Z87891 Personal history of nicotine dependence: Secondary | ICD-10-CM

## 2021-11-26 DIAGNOSIS — R471 Dysarthria and anarthria: Secondary | ICD-10-CM | POA: Diagnosis present

## 2021-11-26 DIAGNOSIS — I70263 Atherosclerosis of native arteries of extremities with gangrene, bilateral legs: Secondary | ICD-10-CM | POA: Diagnosis present

## 2021-11-26 DIAGNOSIS — E1169 Type 2 diabetes mellitus with other specified complication: Secondary | ICD-10-CM | POA: Diagnosis present

## 2021-11-26 DIAGNOSIS — Z95820 Peripheral vascular angioplasty status with implants and grafts: Secondary | ICD-10-CM

## 2021-11-26 DIAGNOSIS — M869 Osteomyelitis, unspecified: Secondary | ICD-10-CM

## 2021-11-26 DIAGNOSIS — Z6841 Body Mass Index (BMI) 40.0 and over, adult: Secondary | ICD-10-CM

## 2021-11-26 DIAGNOSIS — I081 Rheumatic disorders of both mitral and tricuspid valves: Secondary | ICD-10-CM | POA: Diagnosis present

## 2021-11-26 DIAGNOSIS — E66813 Obesity, class 3: Secondary | ICD-10-CM | POA: Diagnosis present

## 2021-11-26 DIAGNOSIS — Z841 Family history of disorders of kidney and ureter: Secondary | ICD-10-CM

## 2021-11-26 LAB — COMPREHENSIVE METABOLIC PANEL
ALT: 7 U/L (ref 0–44)
AST: 11 U/L — ABNORMAL LOW (ref 15–41)
Albumin: 2.8 g/dL — ABNORMAL LOW (ref 3.5–5.0)
Alkaline Phosphatase: 131 U/L — ABNORMAL HIGH (ref 38–126)
Anion gap: 26 — ABNORMAL HIGH (ref 5–15)
BUN: 65 mg/dL — ABNORMAL HIGH (ref 6–20)
CO2: 20 mmol/L — ABNORMAL LOW (ref 22–32)
Calcium: 8.3 mg/dL — ABNORMAL LOW (ref 8.9–10.3)
Chloride: 94 mmol/L — ABNORMAL LOW (ref 98–111)
Creatinine, Ser: 11.34 mg/dL — ABNORMAL HIGH (ref 0.44–1.00)
GFR, Estimated: 4 mL/min — ABNORMAL LOW (ref >60)
Glucose, Bld: 122 mg/dL — ABNORMAL HIGH (ref 70–99)
Potassium: 3.8 mmol/L (ref 3.5–5.1)
Sodium: 140 mmol/L (ref 135–145)
Total Bilirubin: 1 mg/dL (ref 0.3–1.2)
Total Protein: 7.1 g/dL (ref 6.5–8.1)

## 2021-11-26 LAB — DIFFERENTIAL
Abs Immature Granulocytes: 0.13 10*3/uL — ABNORMAL HIGH (ref 0.00–0.07)
Basophils Absolute: 0.1 10*3/uL (ref 0.0–0.1)
Basophils Relative: 1 %
Eosinophils Absolute: 0.6 10*3/uL — ABNORMAL HIGH (ref 0.0–0.5)
Eosinophils Relative: 4 %
Immature Granulocytes: 1 %
Lymphocytes Relative: 9 %
Lymphs Abs: 1.6 10*3/uL (ref 0.7–4.0)
Monocytes Absolute: 0.7 10*3/uL (ref 0.1–1.0)
Monocytes Relative: 4 %
Neutro Abs: 14 10*3/uL — ABNORMAL HIGH (ref 1.7–7.7)
Neutrophils Relative %: 81 %

## 2021-11-26 LAB — PROTIME-INR
INR: 1.3 — ABNORMAL HIGH (ref 0.8–1.2)
Prothrombin Time: 16.3 seconds — ABNORMAL HIGH (ref 11.4–15.2)

## 2021-11-26 LAB — CBC WITH DIFFERENTIAL/PLATELET
Abs Immature Granulocytes: 0.1 10*3/uL — ABNORMAL HIGH (ref 0.00–0.07)
Basophils Absolute: 0.1 10*3/uL (ref 0.0–0.1)
Basophils Relative: 0 %
Eosinophils Absolute: 0.5 10*3/uL (ref 0.0–0.5)
Eosinophils Relative: 3 %
HCT: 29.6 % — ABNORMAL LOW (ref 36.0–46.0)
Hemoglobin: 9.4 g/dL — ABNORMAL LOW (ref 12.0–15.0)
Immature Granulocytes: 1 %
Lymphocytes Relative: 8 %
Lymphs Abs: 1.3 10*3/uL (ref 0.7–4.0)
MCH: 31.3 pg (ref 26.0–34.0)
MCHC: 31.8 g/dL (ref 30.0–36.0)
MCV: 98.7 fL (ref 80.0–100.0)
Monocytes Absolute: 0.6 10*3/uL (ref 0.1–1.0)
Monocytes Relative: 4 %
Neutro Abs: 14 10*3/uL — ABNORMAL HIGH (ref 1.7–7.7)
Neutrophils Relative %: 84 %
Platelets: 247 10*3/uL (ref 150–400)
RBC: 3 MIL/uL — ABNORMAL LOW (ref 3.87–5.11)
RDW: 17.9 % — ABNORMAL HIGH (ref 11.5–15.5)
WBC: 16.6 10*3/uL — ABNORMAL HIGH (ref 4.0–10.5)
nRBC: 0.2 % (ref 0.0–0.2)

## 2021-11-26 LAB — HEMOGLOBIN A1C
Hgb A1c MFr Bld: 5.1 % (ref 4.8–5.6)
Mean Plasma Glucose: 99.67 mg/dL

## 2021-11-26 LAB — RESP PANEL BY RT-PCR (FLU A&B, COVID) ARPGX2
Influenza A by PCR: NEGATIVE
Influenza B by PCR: NEGATIVE
SARS Coronavirus 2 by RT PCR: POSITIVE — AB

## 2021-11-26 LAB — CBC
HCT: 28.4 % — ABNORMAL LOW (ref 36.0–46.0)
Hemoglobin: 9.2 g/dL — ABNORMAL LOW (ref 12.0–15.0)
MCH: 32.3 pg (ref 26.0–34.0)
MCHC: 32.4 g/dL (ref 30.0–36.0)
MCV: 99.6 fL (ref 80.0–100.0)
Platelets: 280 10*3/uL (ref 150–400)
RBC: 2.85 MIL/uL — ABNORMAL LOW (ref 3.87–5.11)
RDW: 18 % — ABNORMAL HIGH (ref 11.5–15.5)
WBC: 17.2 10*3/uL — ABNORMAL HIGH (ref 4.0–10.5)
nRBC: 0.2 % (ref 0.0–0.2)

## 2021-11-26 LAB — LIPID PANEL
Cholesterol: 125 mg/dL (ref 0–200)
HDL: 34 mg/dL — ABNORMAL LOW (ref >40)
LDL Cholesterol: 76 mg/dL (ref 0–99)
Total CHOL/HDL Ratio: 3.7 RATIO
Triglycerides: 73 mg/dL (ref <150)
VLDL: 15 mg/dL (ref 0–40)

## 2021-11-26 LAB — GLUCOSE, CAPILLARY: Glucose-Capillary: 72 mg/dL (ref 70–99)

## 2021-11-26 LAB — ETHANOL: Alcohol, Ethyl (B): <10 mg/dL (ref <10)

## 2021-11-26 LAB — CBG MONITORING, ED: Glucose-Capillary: 115 mg/dL — ABNORMAL HIGH (ref 70–99)

## 2021-11-26 LAB — D-DIMER, QUANTITATIVE: D-Dimer, Quant: 0.72 ug/mL-FEU — ABNORMAL HIGH (ref 0.00–0.50)

## 2021-11-26 LAB — C-REACTIVE PROTEIN: CRP: 5.9 mg/dL — ABNORMAL HIGH (ref <1.0)

## 2021-11-26 LAB — HEPARIN LEVEL (UNFRACTIONATED): Heparin Unfractionated: <0.1 IU/mL — ABNORMAL LOW (ref 0.30–0.70)

## 2021-11-26 LAB — APTT: aPTT: 38 seconds — ABNORMAL HIGH (ref 24–36)

## 2021-11-26 IMAGING — MR MR HEAD W/O CM
8 series · 48 of 48 positions shown · non-contrast
Comparison: Noncontrast head CT performed immediately prior
[DATE].

CLINICAL DATA: Provided history: Neuro deficit, acute, stroke
suspected.

EXAM:
MRI HEAD WITHOUT CONTRAST
TECHNIQUE: Multiplanar, multiecho pulse sequences of the brain and surrounding
structures were obtained without intravenous contrast.

[Series 5: ax dwi_tracew · axial · 3.0mm · 1.80mm/px · z∈[-33,+119]mm · 7 of 48 slices shown]
[im 1/48]
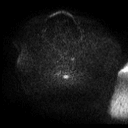
[im 8/48]
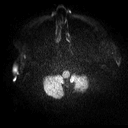
[im 16/48]
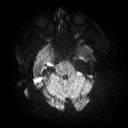
[im 24/48]
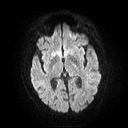
[im 32/48]
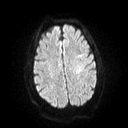
[im 40/48]
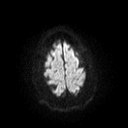
[im 48/48]
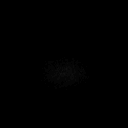

[Series 6: ax dwi_adc · axial · 3.0mm · 1.80mm/px · z∈[-33,+106]mm · 6 of 43 slices shown]
[im 1/43]
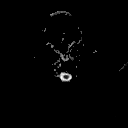
[im 9/43]
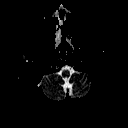
[im 17/43]
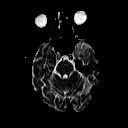
[im 26/43]
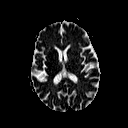
[im 34/43]
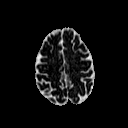
[im 43/43]
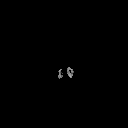

[Series 7: cor dwi_tracew · coronal · 5.0mm · 1.80mm/px · 5 of 38 slices shown]
[im 1/38]
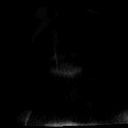
[im 10/38]
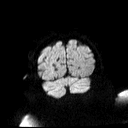
[im 19/38]
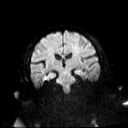
[im 28/38]
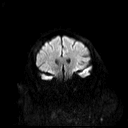
[im 38/38]
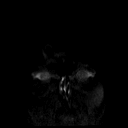

[Series 8: cor dwi_adc · coronal · 5.0mm · 1.80mm/px · 5 of 38 slices shown]
[im 1/38]
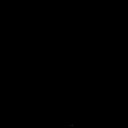
[im 10/38]
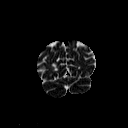
[im 19/38]
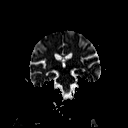
[im 28/38]
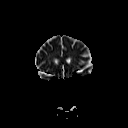
[im 38/38]
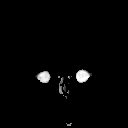

[Series 9: FLAIR · axial · 3.0mm · 0.69mm/px · z∈[-34,+115]mm · 7 of 51 slices shown]
[im 1/51]
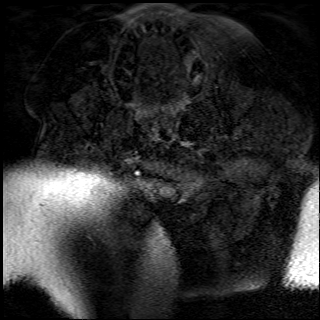
[im 9/51]
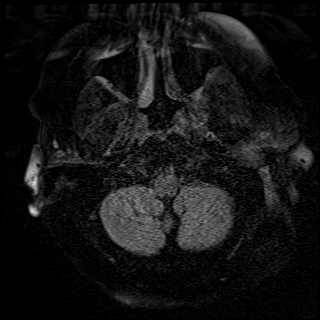
[im 17/51]
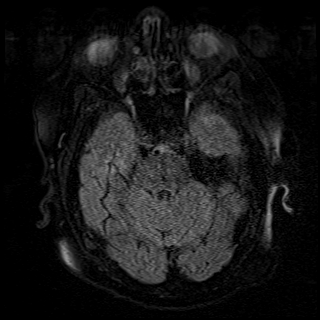
[im 26/51]
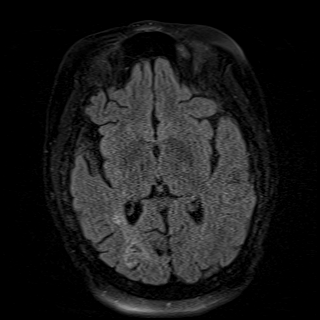
[im 34/51]
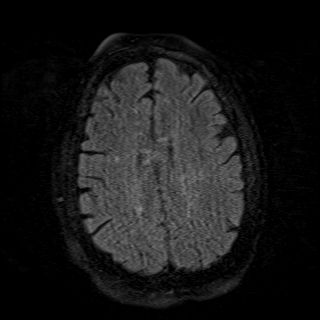
[im 42/51]
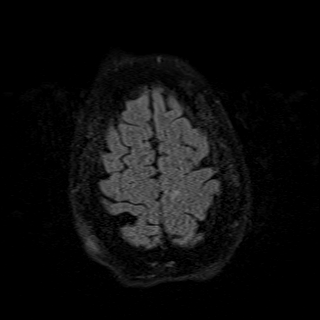
[im 51/51]
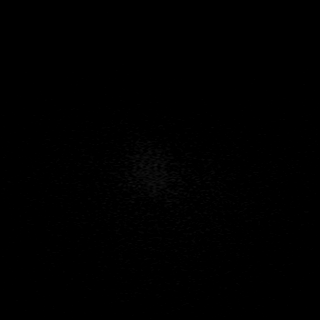

[Series 10: mag_images · axial · 3.0mm · 0.90mm/px · z∈[-29,+111]mm · 6 of 48 slices shown]
[im 1/48]
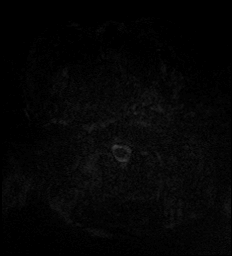
[im 10/48]
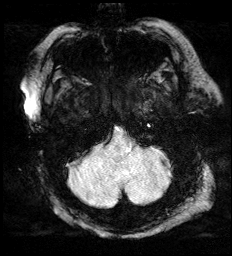
[im 19/48]
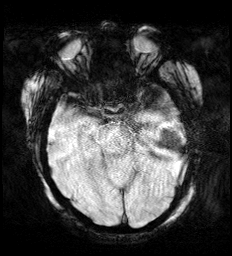
[im 29/48]
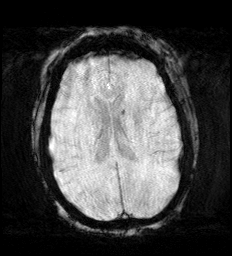
[im 38/48]
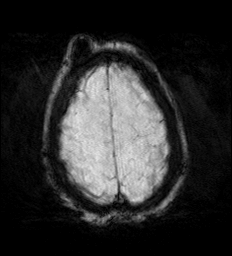
[im 48/48]
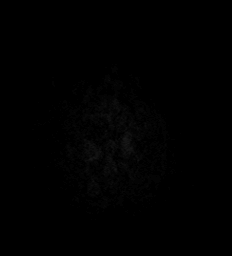

[Series 11: pha_images · axial · 3.0mm · 0.90mm/px · z∈[-26,+111]mm · 6 of 47 slices shown]
[im 1/47]
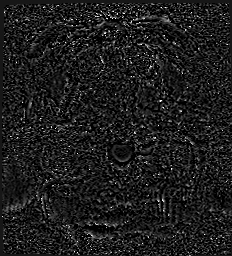
[im 10/47]
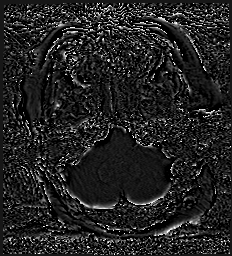
[im 19/47]
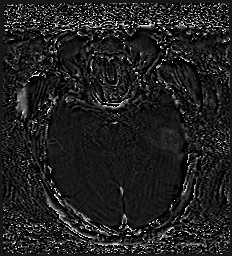
[im 28/47]
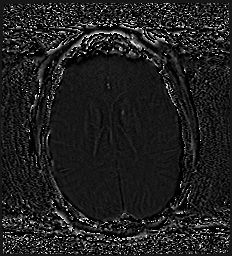
[im 37/47]
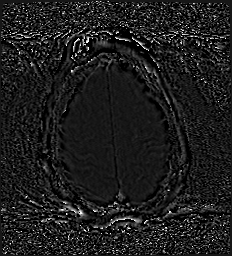
[im 47/47]
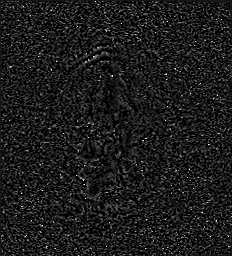

[Series 12: swi_images · axial · 3.0mm · 0.90mm/px · z∈[-29,+111]mm · 6 of 48 slices shown]
[im 1/48]
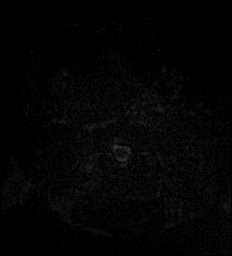
[im 10/48]
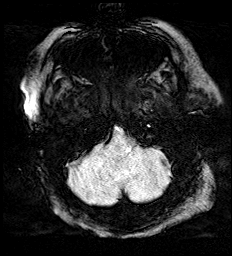
[im 19/48]
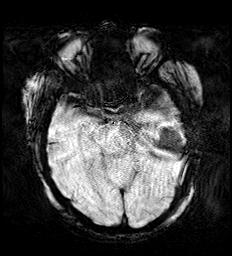
[im 29/48]
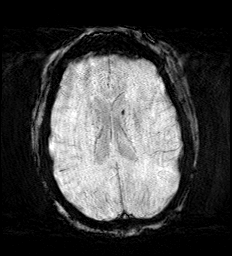
[im 38/48]
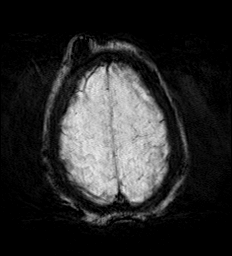
[im 48/48]
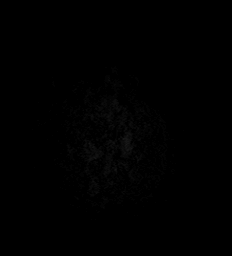

[48 of 48 positions shown; findings below may reference images not displayed]

FINDINGS: Brain:

An abbreviated protocol examination was performed at the provider's
request. The following sequences were acquired: Axial and coronal
diffusion-weighted imaging, axial T2 FLAIR sequence and axial SWI
sequence. The axial T2 FLAIR sequence is moderately motion degraded,
limiting evaluation.

Subcentimeter acute cortically-based infarct within the posterior
left frontal lobe (series 5, image 35).

Subcentimeter acute cortically-based infarct within the lateral
right frontal lobe (at the anterior aspect of the right sylvian
fissure)(series 5, image 28).

Patchy small acute infarcts within the callosal splenium on the
right.

12 mm focus of diffusion-weighted hyperintensity within the mid left
frontal lobe white matter (series 5, image 32). No definite ADC
correlate is appreciated. However, this finding is present on both
the axial and coronal diffusion-weighted sequences, and this may
reflect a recent infarct.

Similarly, there is an ill-defined focus of diffusion-weighted
hyperintensity within the left frontoparietal periventricular white
matter measuring 10 mm (series 5, image 31). No definite ADC
correlate is appreciated. However, this finding is present on both
the axial and coronal diffusion-weighted sequences, and this may
reflect a recent infarct.

Small chronic cortical/subcortical infarct within the right
parietooccipital lobes.

Chronic lacunar infarct within the left caudate nucleus with
associated chronic hemosiderin deposition at this site.

Multifocal T2 FLAIR hyperintense signal abnormality within the
cerebral white matter, nonspecific but compatible with chronic small
vessel ischemic disease. These signal changes are overall mild, but
advanced for age.

No evidence of an intracranial mass or extra-axial fluid collection.
No midline shift.

Vascular: Flow voids poorly assessed in the absence of T2 TSE
imaging.

Skull and upper cervical spine: 1.9 cm bony protuberance projecting
outward from the right frontal calvarium, likely reflecting an
osteoma.

Sinuses/Orbits: No acute orbital finding. Mild mucosal thickening
within the bilateral ethmoid sinuses.

Other: Trapped fluid within the left petrous apex.

Impression #2 called by telephone at the time of interpretation on
[DATE] at [DATE] to provider KIET , who verbally
acknowledged these results.
IMPRESSION: 1. Abbreviated protocol motion degraded examination, as described.
2. Subcentimeter acute cortically-based infarcts within the lateral
right frontal lobe and posterior left frontal lobe. Patchy small
acute infarcts within the callosal splenium on the right. Additional
small recent infarcts (measuring up to 12 mm) are questioned within
the left frontoparietal white matter. Given small infarcts involving
multiple vascular territories, there is suspicion for an embolic
process.
3. Small chronic cortical/subcortical infarct within the right
parietooccipital lobes.
4. Chronic hemorrhagic lacunar infarct within the left caudate
nucleus.
5. Background chronic small-vessel ischemic changes within the
cerebral white matter, mild but advanced for age.
6. 1.9 cm osteoma projecting outward from the right frontal
calvarium.
7. Mild mucosal thickening within the bilateral ethmoid sinuses.
8. Trapped fluid within the left petrous apex.

## 2021-11-26 IMAGING — CT CT HEAD CODE STROKE
4 series · 15 of 47 positions shown, 17 images · non-contrast
Comparison: Report from head CT [DATE] (images unavailable).

CLINICAL DATA: Code stroke. Provided history: Neuro deficit, acute,
stroke suspected. Additional history provided: Aphasia, left-sided
weakness. Last known normal [DATE] a.m.



[Series 3: head wo · axial · 0.46mm/px · z∈[+622,+742]mm · 7 of 34 slices shown, 9 images]
[im 5/34  brain]
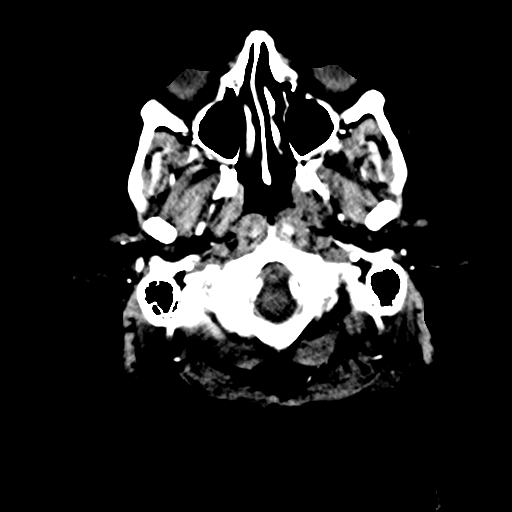
[im 5/34  bone]
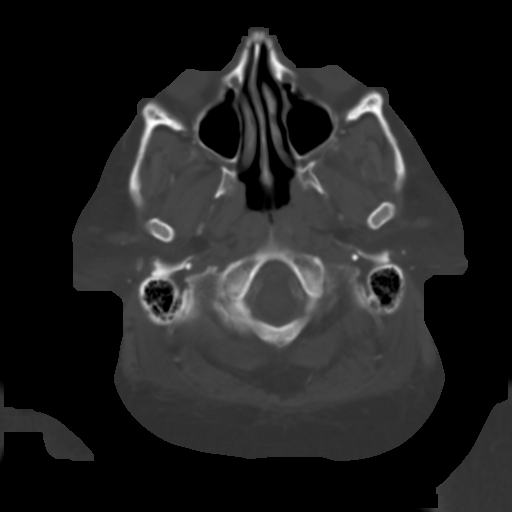
[im 9/34  brain]
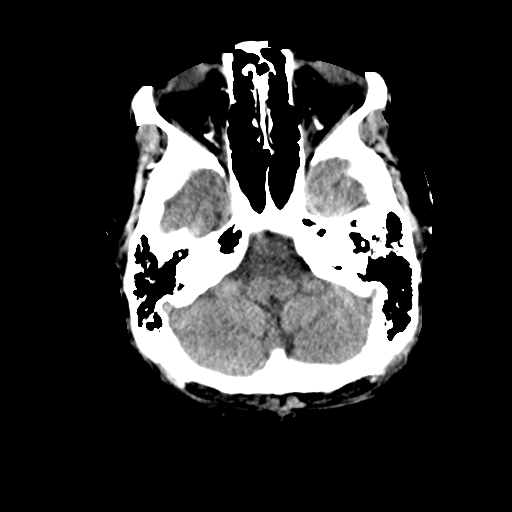
[im 13/34  brain]
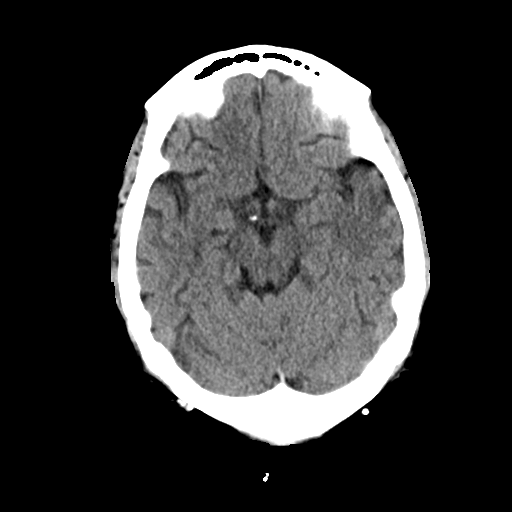
[im 17/34  brain]
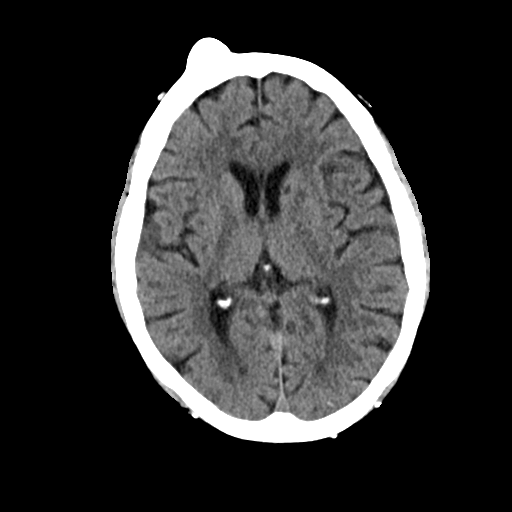
[im 21/34  brain]
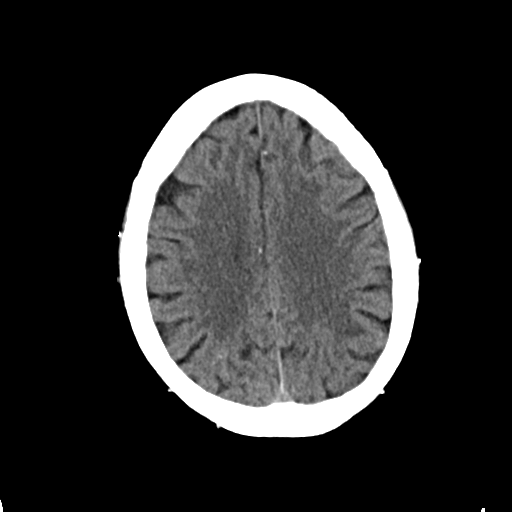
[im 21/34  bone]
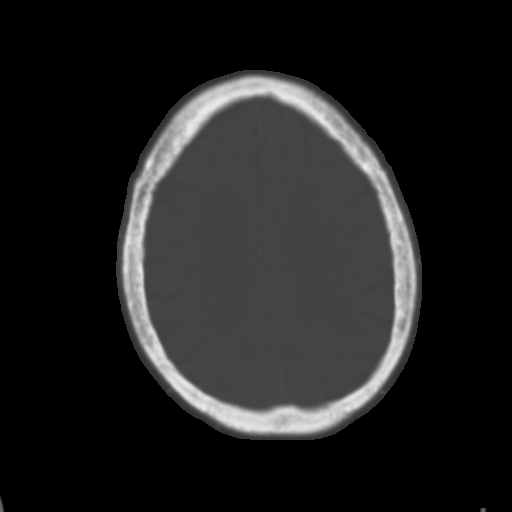
[im 25/34  brain]
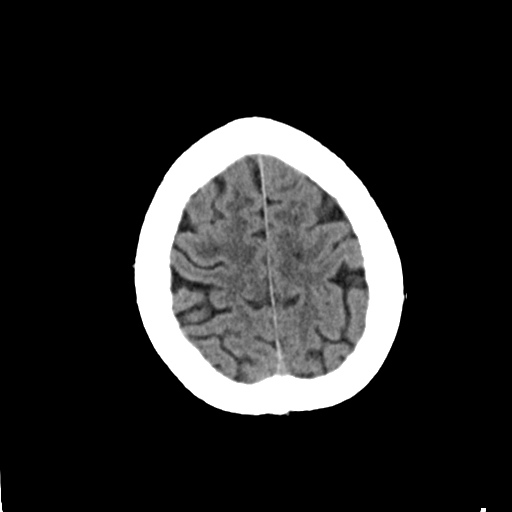
[im 29/34  brain]
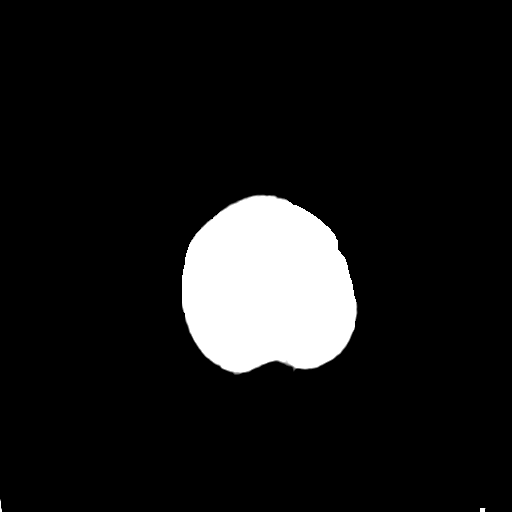

[Series 4: head bone · axial · 0.46mm/px · z∈[+618,+634]mm · 2 of 85 slices shown]
[im 9/85  bone]
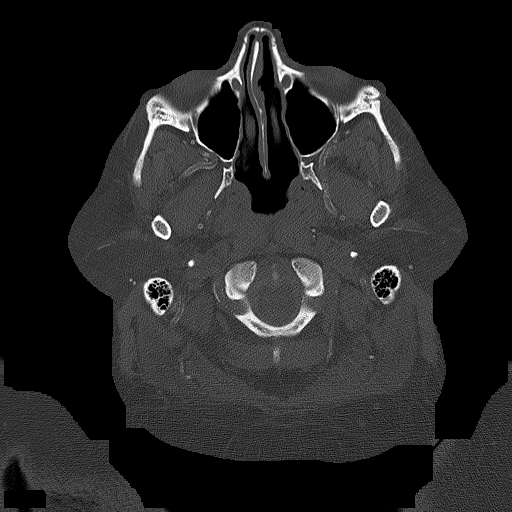
[im 17/85  bone]
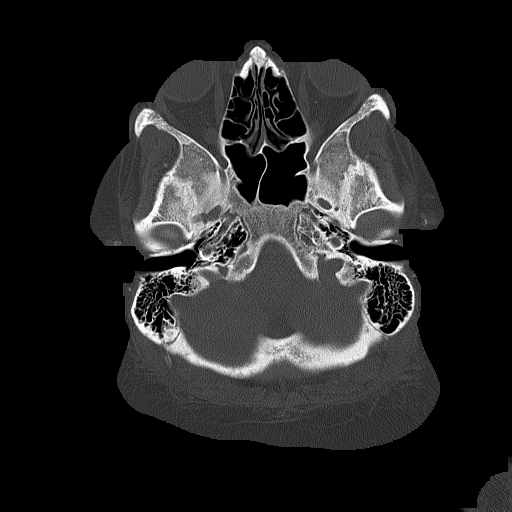

[Series 5: coronal soft tissue · coronal · 0.36mm/px · 3 of 74 slices shown]
[im 25/74  brain]
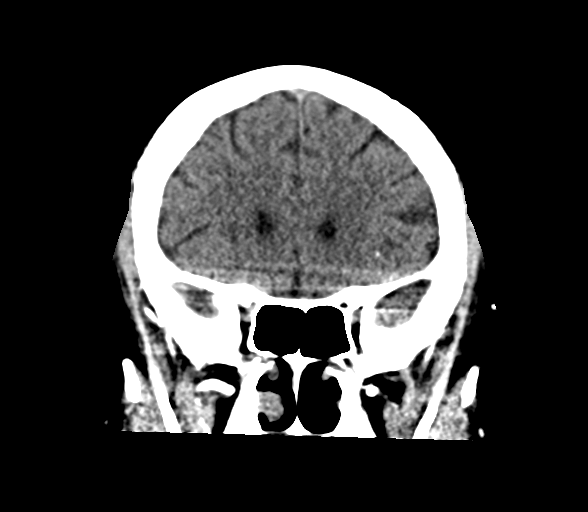
[im 33/74  brain]
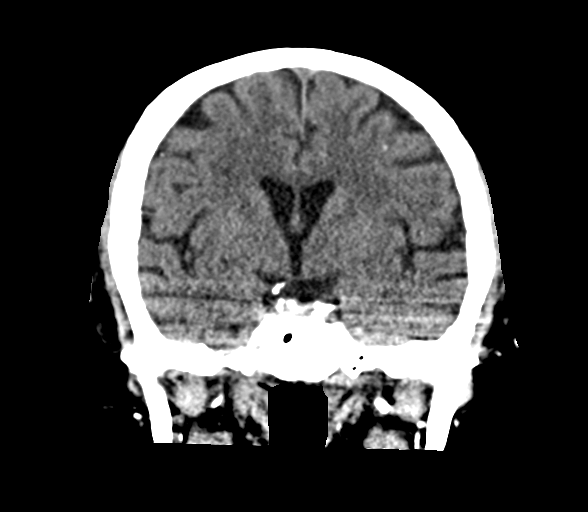
[im 41/74  brain]
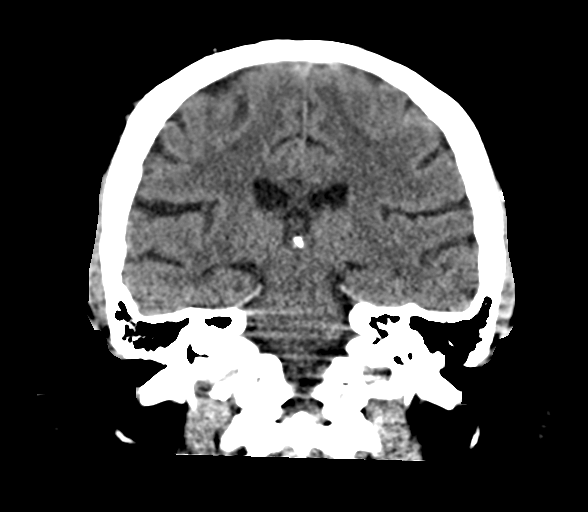

[Series 6: sagittal soft tissue · sagittal · 0.35mm/px · 3 of 70 slices shown]
[im 24/70  brain]
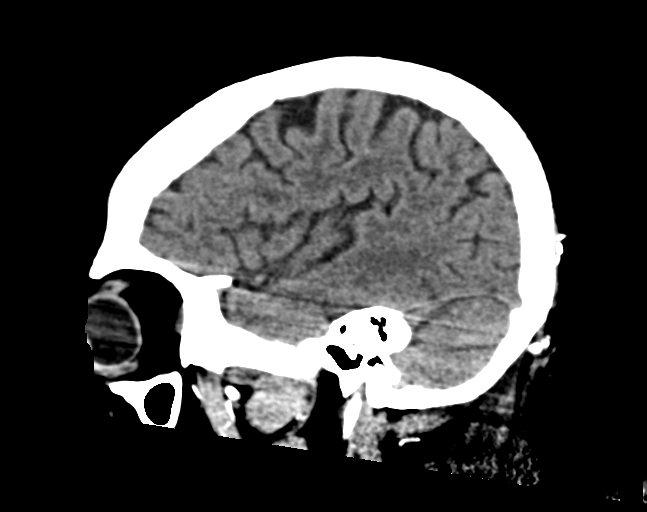
[im 35/70  brain]
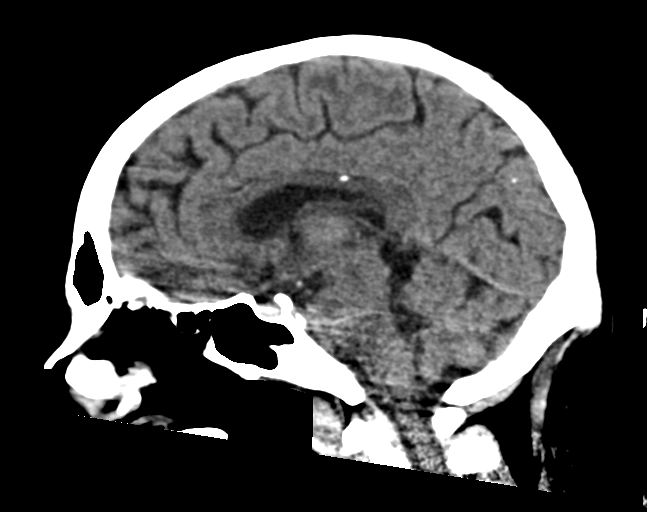
[im 47/70  brain]
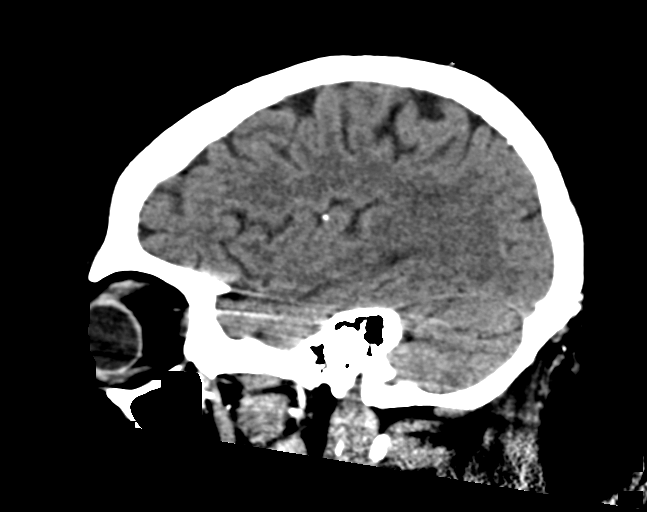

[15 of 47 positions shown; findings below may reference images not displayed]

FINDINGS: Brain:

Mild generalized cerebral atrophy, advanced for age.

Small chronic cortical/subcortical infarct within the right
parietooccipital lobes.

Patchy and ill-defined hypoattenuation within the cerebral white
matter, nonspecific but compatible with chronic small vessel
ischemic disease. Findings are overall mild, but advanced for age.

Small age-indeterminate lacunar infarct within the anterior limb of
right internal capsule (series 3, image 18) (series 5, image 32).

Chronic lacunar infarct within the left caudate nucleus.

Scattered supratentorial calcifications, some many of which appear
parenchymal.

There is no acute intracranial hemorrhage.

No acute demarcated cortical infarct is identified.

No extra-axial fluid collection.

No evidence of an intracranial mass.

No midline shift.

Vascular: No hyperdense vessel. Atherosclerotic calcifications.

Skull: No calvarial fracture. Bony protuberance projecting outward
from the right frontal calvarium, measuring 1.9 x 1.1 cm in
transaxial dimensions, compatible with an osteoma (series 4, image
42).

Sinuses/Orbits: Visualized orbits show no acute finding. No
significant paranasal sinus disease.

ASPECTS ([place]berta Stroke Program Early CT Score)

- Ganglionic level infarction (caudate, lentiform nuclei, internal
capsule, insula, M1-M3 cortex): 6

- Supraganglionic infarction (M4-M6 cortex): 3

Total score (0-10 with 10 being normal): 9

Impressions #1 and #2 were communicated to Dr. SANGLIER at [DATE] pmon
[DATE] by text page via the [employer] messaging system.
IMPRESSION: 1. No acute intracranial hemorrhage or acute demarcated cortical
infarct.
2. Age-indeterminate lacunar infarct within the anterior limb of
right internal capsule.
3. Small chronic cortical/subcortical infarct within the right
parietooccipital lobes.
4. Chronic lacunar infarct within the left caudate nucleus.
5. Chronic small-vessel ischemic changes within the cerebral white
matter, overall mild but advanced for age.
6. Scattered punctate supratentorial parenchymal calcifications.
Findings are nonspecific, but may be seen as sequela of
neurocysticercosis.
7. Mild cerebral atrophy, advanced for age.
8. 1.9 cm osteoma projecting outward from the right frontal
calvarium.

## 2021-11-26 IMAGING — DX DG FOOT 2V*L*
2 series · 2 of 2 positions shown · non-contrast
Comparison: [DATE]

CLINICAL DATA: Pain

EXAM:
LEFT FOOT - 2 VIEW

[foot ap]
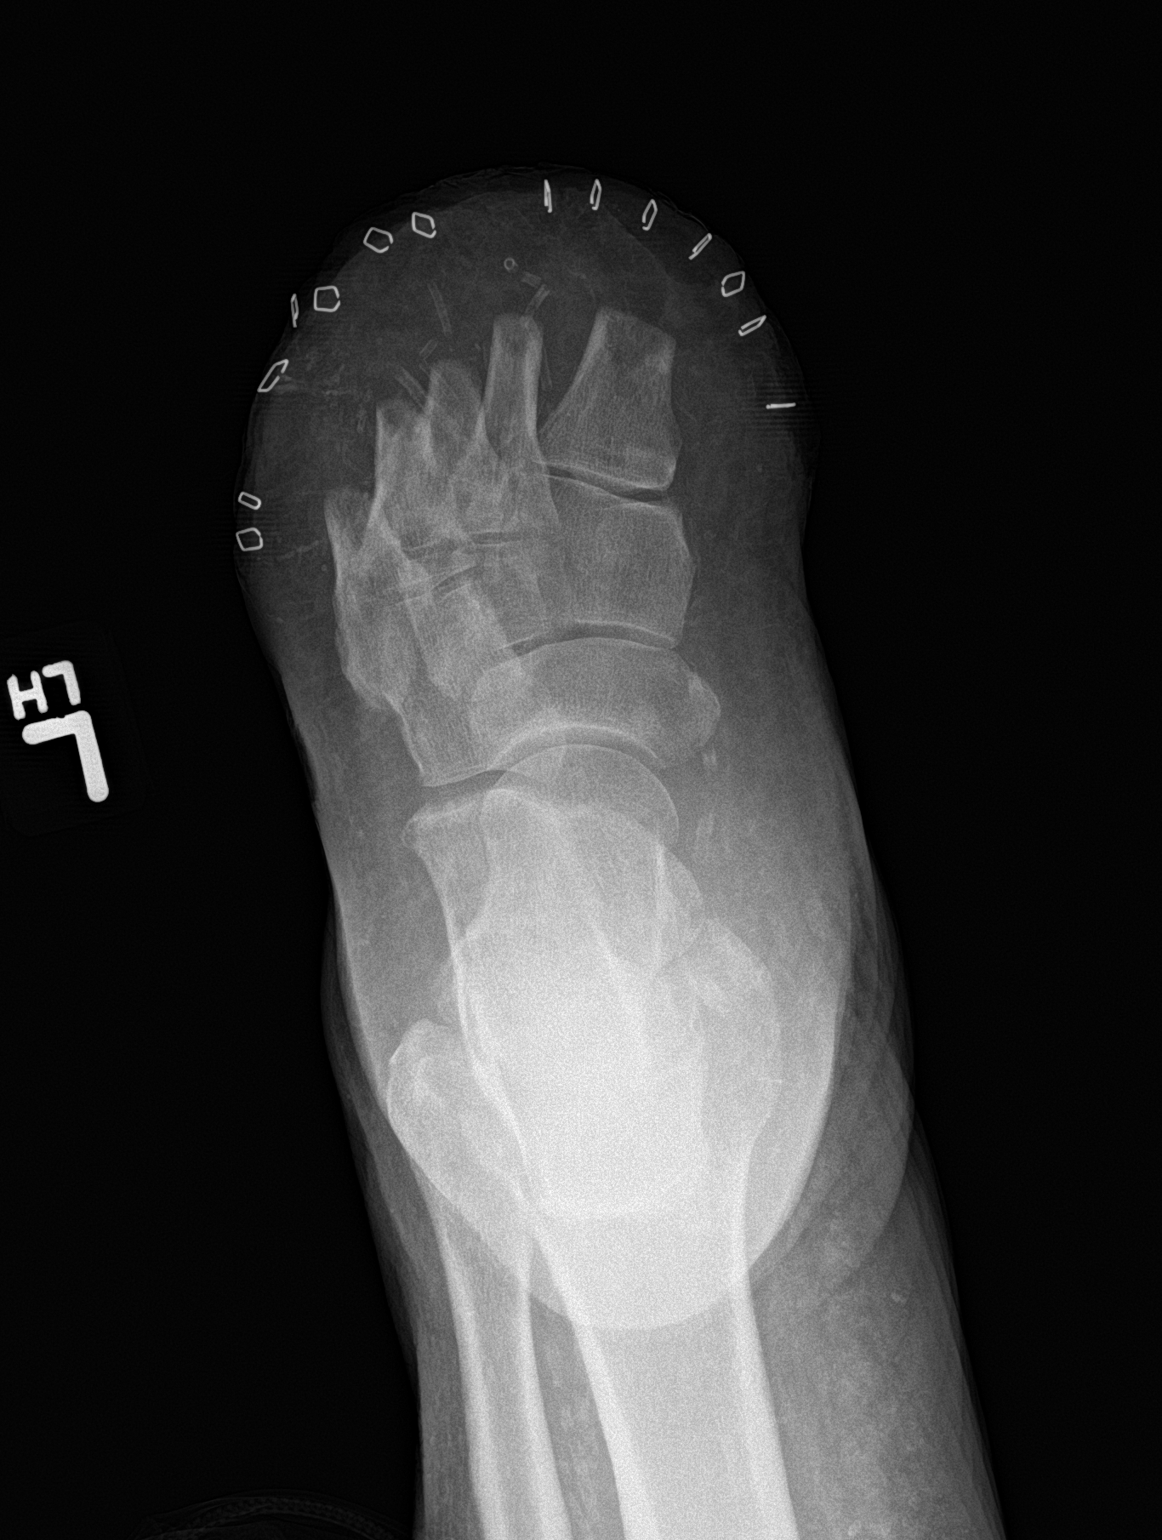

[foot lat]
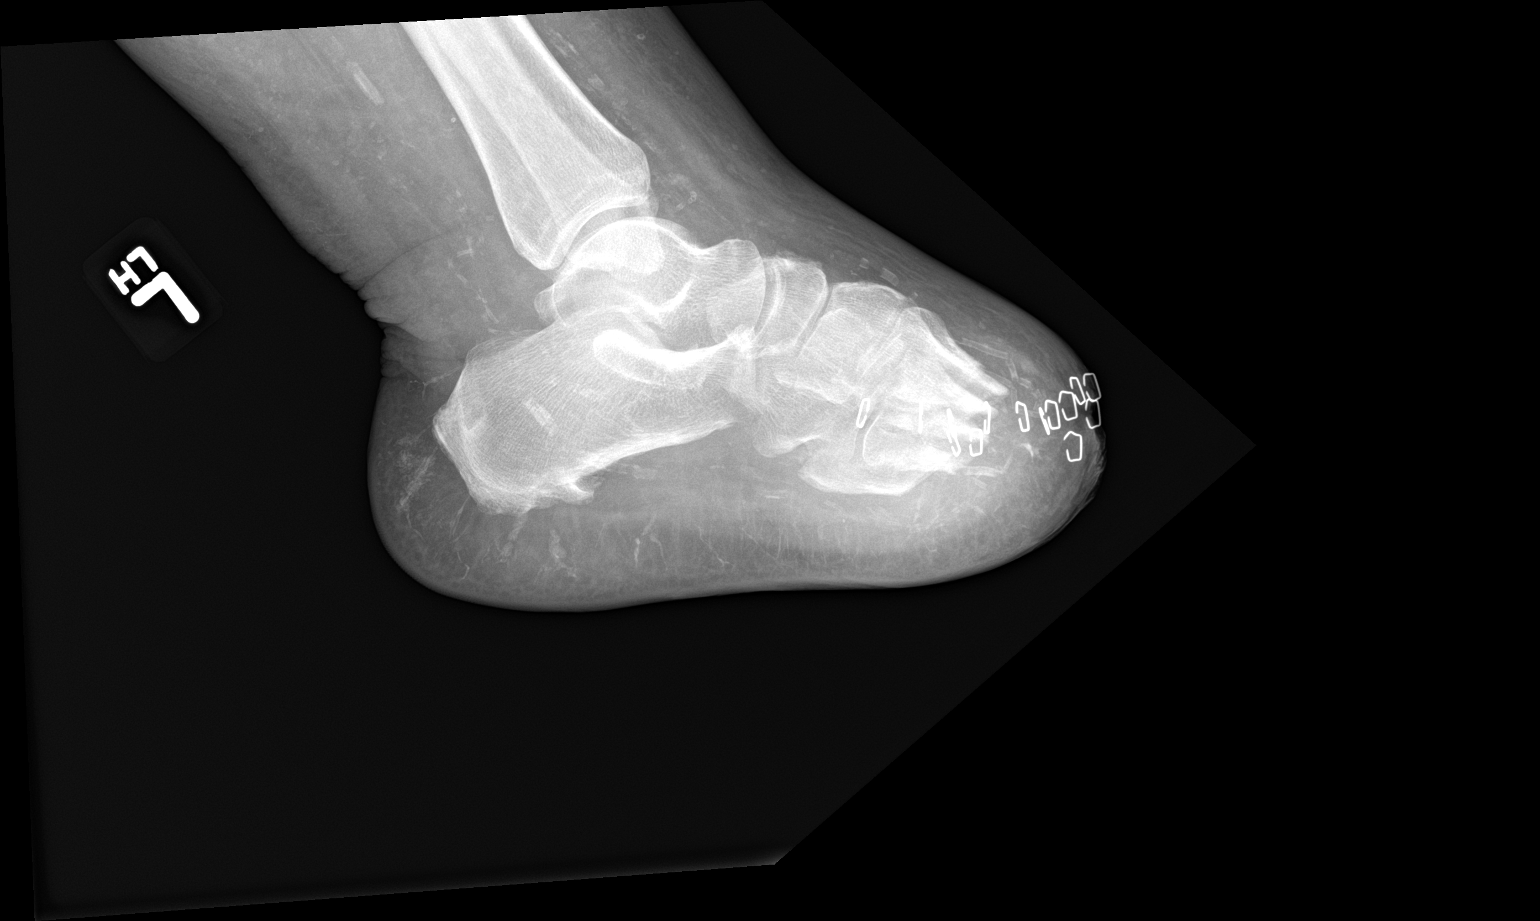

[2 of 2 positions shown; findings below may reference images not displayed]

FINDINGS: Postsurgical changes of transmetatarsal amputation of the first
through fifth rays. There is new bony irregularity along the
resection margins of the third, fourth, and fifth metatarsals. No
acute fracture. No dislocation. No deep soft tissue ulceration is
evident radiographically. Severe small vessel atherosclerotic
calcifications.
IMPRESSION: Postsurgical changes of transmetatarsal amputation of the first
through fifth rays. New bony irregularity along the resection
margins of the third, fourth, and fifth metatarsals concerning for
osteomyelitis.

## 2021-11-26 IMAGING — DX DG CHEST 1V PORT
1 series · 1 of 1 positions shown · non-contrast
Comparison: [DATE]

CLINICAL DATA: Weakness

EXAM:
PORTABLE CHEST 1 VIEW

[chest ap]
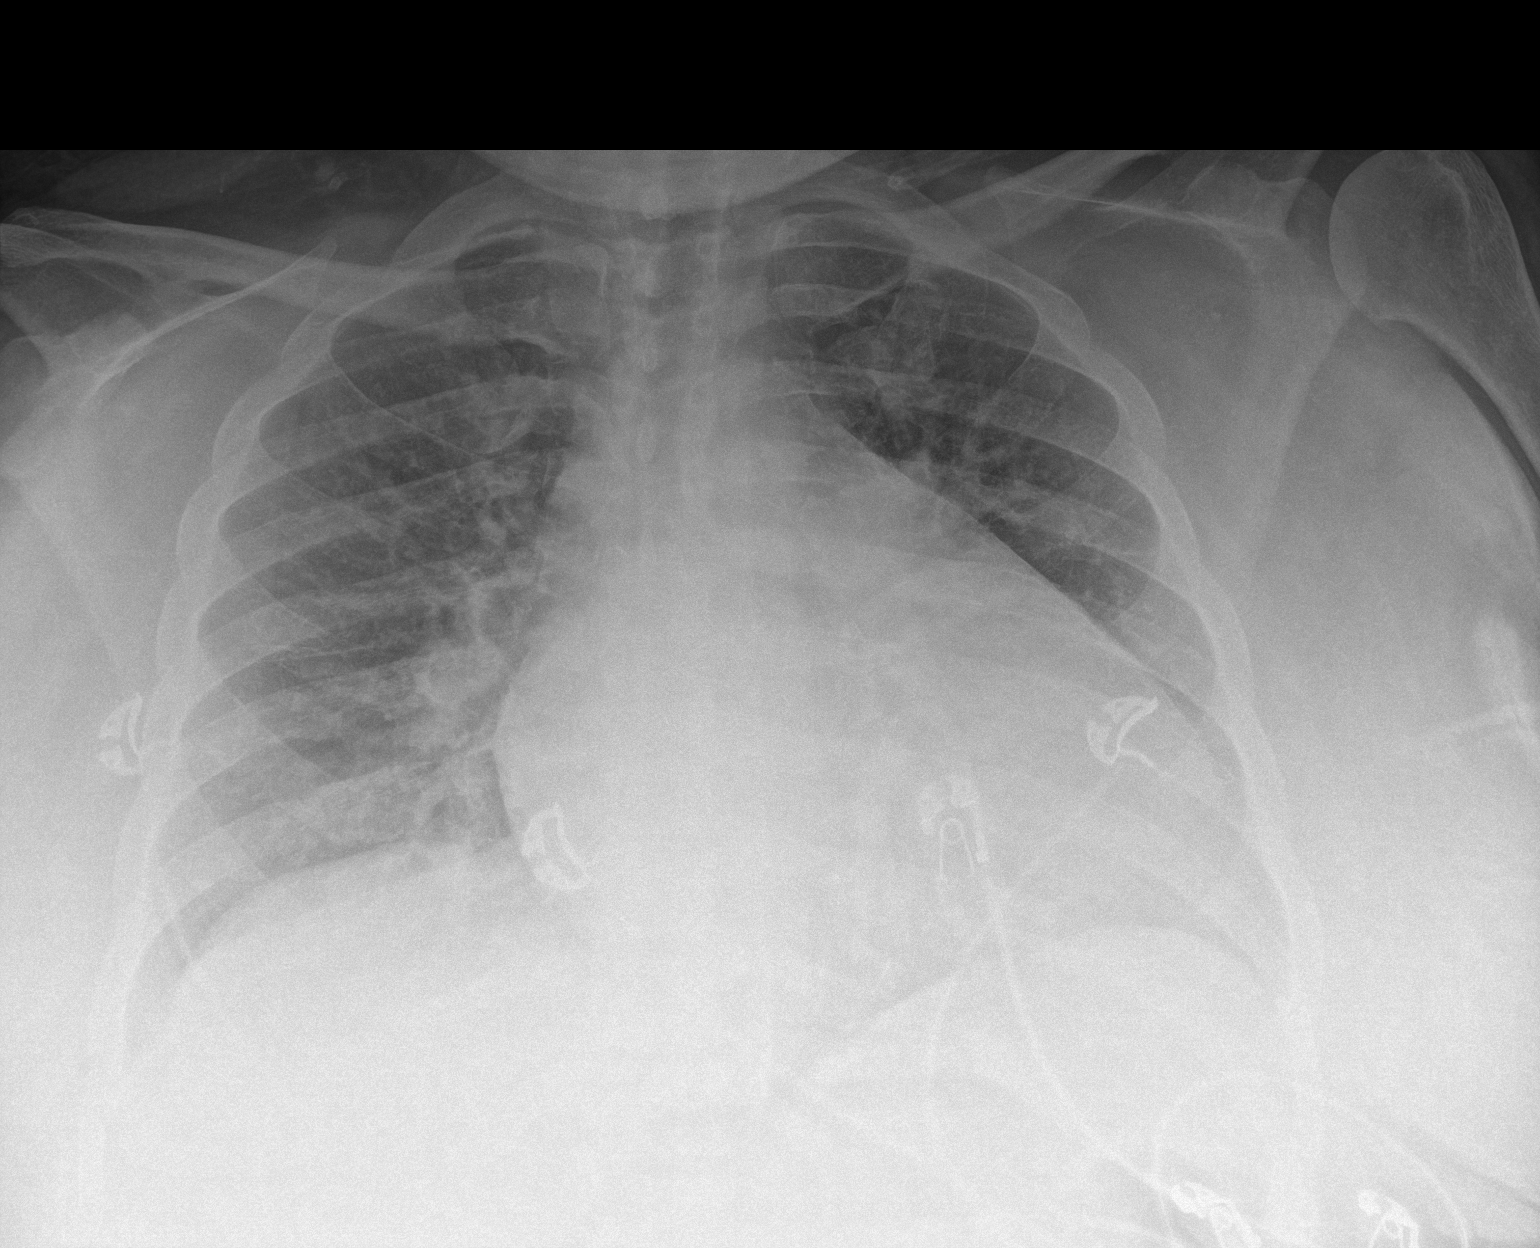

[1 of 1 positions shown; findings below may reference images not displayed]

FINDINGS: Stable cardiomegaly. Mild pulmonary vascular congestion. No overt
pulmonary edema. No focal airspace consolidation. No pleural
effusion or pneumothorax.
IMPRESSION: Cardiomegaly with mild pulmonary vascular congestion.

## 2021-11-26 SURGERY — IRRIGATION AND DEBRIDEMENT FOOT
Anesthesia: General | Laterality: Left

## 2021-11-26 MED ORDER — HEPARIN (PORCINE) 25000 UT/250ML-% IV SOLN
1500.0000 [IU]/h | INTRAVENOUS | Status: DC
  Administered 2021-11-26: 1500 [IU]/h via INTRAVENOUS
  Filled 2021-11-26: qty 250

## 2021-11-26 MED ORDER — LACTATED RINGERS IV SOLN: INTRAVENOUS | Status: DC

## 2021-11-26 MED ORDER — PROPOFOL 500 MG/50ML IV EMUL
INTRAVENOUS | Status: AC
  Filled 2021-11-26: qty 50

## 2021-11-26 MED ORDER — ASPIRIN 300 MG RE SUPP
300.0000 mg | Freq: Every day | RECTAL | Status: DC
  Administered 2021-11-27 – 2021-11-30 (×3): 300 mg via RECTAL
  Filled 2021-11-26 (×5): qty 1

## 2021-11-26 MED ORDER — ACETAMINOPHEN 325 MG PO TABS: 650.0000 mg | ORAL_TABLET | ORAL | Status: DC | PRN

## 2021-11-26 MED ORDER — SODIUM CHLORIDE 0.9 % IV SOLN
100.0000 mg | Freq: Every day | INTRAVENOUS | Status: AC
  Administered 2021-11-27 – 2021-11-30 (×4): 100 mg via INTRAVENOUS
  Filled 2021-11-26 (×4): qty 100

## 2021-11-26 MED ORDER — STROKE: EARLY STAGES OF RECOVERY BOOK: Freq: Once | Status: AC

## 2021-11-26 MED ORDER — CEFAZOLIN SODIUM-DEXTROSE 2-4 GM/100ML-% IV SOLN: 2.0000 g | INTRAVENOUS | Status: DC

## 2021-11-26 MED ORDER — HYDRALAZINE HCL 20 MG/ML IJ SOLN: 5.0000 mg | INTRAMUSCULAR | Status: DC | PRN

## 2021-11-26 MED ORDER — ASPIRIN 325 MG PO TABS
325.0000 mg | ORAL_TABLET | Freq: Every day | ORAL | Status: DC
  Administered 2021-11-26: 325 mg via ORAL
  Filled 2021-11-26 (×2): qty 1

## 2021-11-26 MED ORDER — SODIUM CHLORIDE 0.9 % IV SOLN
1.0000 g | Freq: Every day | INTRAVENOUS | Status: DC
  Administered 2021-11-26 – 2021-12-01 (×6): 1 g via INTRAVENOUS
  Filled 2021-11-26 (×8): qty 1

## 2021-11-26 MED ORDER — ACETAMINOPHEN 160 MG/5ML PO SOLN
650.0000 mg | ORAL | Status: DC | PRN
  Administered 2021-11-27: 650 mg
  Filled 2021-11-26 (×2): qty 20.3

## 2021-11-26 MED ORDER — HEPARIN (PORCINE) 25000 UT/250ML-% IV SOLN
2100.0000 [IU]/h | INTRAVENOUS | Status: DC
  Administered 2021-11-27 (×2): 1750 [IU]/h via INTRAVENOUS
  Administered 2021-11-28 (×2): 2100 [IU]/h via INTRAVENOUS
  Administered 2021-11-29 (×2): 2200 [IU]/h via INTRAVENOUS
  Administered 2021-11-30: 15:00:00 2100 [IU]/h via INTRAVENOUS
  Administered 2021-11-30 – 2021-12-01 (×2): 2200 [IU]/h via INTRAVENOUS
  Administered 2021-12-02 – 2021-12-04 (×3): 2300 [IU]/h via INTRAVENOUS
  Filled 2021-11-26 (×12): qty 250

## 2021-11-26 MED ORDER — COLLAGENASE 250 UNIT/GM EX OINT
TOPICAL_OINTMENT | Freq: Every day | CUTANEOUS | Status: DC
  Administered 2021-11-30: 1 via TOPICAL
  Filled 2021-11-26 (×3): qty 30

## 2021-11-26 MED ORDER — LIDOCAINE HCL (PF) 2 % IJ SOLN
INTRAMUSCULAR | Status: AC
  Filled 2021-11-26: qty 5

## 2021-11-26 MED ORDER — ACETAMINOPHEN 650 MG RE SUPP
650.0000 mg | RECTAL | Status: DC | PRN
  Administered 2021-11-27: 650 mg via RECTAL
  Filled 2021-11-26 (×2): qty 1

## 2021-11-26 MED ORDER — PROPOFOL 10 MG/ML IV BOLUS
INTRAVENOUS | Status: AC
  Filled 2021-11-26: qty 20

## 2021-11-26 MED ORDER — SODIUM CHLORIDE 0.9 % IV SOLN
200.0000 mg | Freq: Once | INTRAVENOUS | Status: AC
  Administered 2021-11-26: 200 mg via INTRAVENOUS
  Filled 2021-11-26: qty 40

## 2021-11-26 SURGICAL SUPPLY — 65 items
BLADE OSC/SAGITTAL MD 5.5X18 (BLADE) IMPLANT
BLADE OSCILLATING/SAGITTAL (BLADE)
BLADE SURG 15 STRL LF DISP TIS (BLADE) ×1 IMPLANT
BLADE SURG 15 STRL SS (BLADE) ×1
BLADE SW THK.38XMED LNG THN (BLADE) IMPLANT
BNDG COHESIVE 4X5 TAN ST LF (GAUZE/BANDAGES/DRESSINGS) ×2 IMPLANT
BNDG COHESIVE 6X5 TAN ST LF (GAUZE/BANDAGES/DRESSINGS) ×2 IMPLANT
BNDG CONFORM 2 STRL LF (GAUZE/BANDAGES/DRESSINGS) ×2 IMPLANT
BNDG CONFORM 3 STRL LF (GAUZE/BANDAGES/DRESSINGS) ×2 IMPLANT
BNDG ELASTIC 4X5.8 VLCR STR LF (GAUZE/BANDAGES/DRESSINGS) ×2 IMPLANT
BNDG ESMARK 4X12 TAN STRL LF (GAUZE/BANDAGES/DRESSINGS) ×2 IMPLANT
BNDG GAUZE ELAST 4 BULKY (GAUZE/BANDAGES/DRESSINGS) ×2 IMPLANT
CANISTER WOUND CARE 500ML ATS (WOUND CARE) ×2 IMPLANT
CHLORAPREP W/TINT 26 (MISCELLANEOUS) ×2 IMPLANT
CUFF TOURN SGL QUICK 12 (TOURNIQUET CUFF) IMPLANT
CUFF TOURN SGL QUICK 18X4 (TOURNIQUET CUFF) IMPLANT
DRAPE FLUOR MINI C-ARM 54X84 (DRAPES) IMPLANT
DRAPE XRAY CASSETTE 23X24 (DRAPES) IMPLANT
DURAPREP 26ML APPLICATOR (WOUND CARE) ×2 IMPLANT
ELECT REM PT RETURN 9FT ADLT (ELECTROSURGICAL) ×2
ELECTRODE REM PT RTRN 9FT ADLT (ELECTROSURGICAL) ×1 IMPLANT
GAUZE PACKING 1/4 X5 YD (GAUZE/BANDAGES/DRESSINGS) ×2 IMPLANT
GAUZE PACKING IODOFORM 1X5 (PACKING) ×2 IMPLANT
GAUZE SPONGE 4X4 12PLY STRL (GAUZE/BANDAGES/DRESSINGS) ×2 IMPLANT
GAUZE XEROFORM 1X8 LF (GAUZE/BANDAGES/DRESSINGS) ×2 IMPLANT
GLOVE SURG ENC MOIS LTX SZ7 (GLOVE) ×4 IMPLANT
GLOVE SURG SYN 7.5  E (GLOVE) ×2
GLOVE SURG SYN 7.5 E (GLOVE) ×2 IMPLANT
GLOVE SURG UNDER POLY LF SZ7.5 (GLOVE) ×2 IMPLANT
GOWN STRL REUS W/ TWL XL LVL3 (GOWN DISPOSABLE) ×2 IMPLANT
GOWN STRL REUS W/TWL MED LVL3 (GOWN DISPOSABLE) IMPLANT
GOWN STRL REUS W/TWL XL LVL3 (GOWN DISPOSABLE) ×2
HANDPIECE VERSAJET DEBRIDEMENT (MISCELLANEOUS) IMPLANT
IV NS 1000ML (IV SOLUTION) ×1
IV NS 1000ML BAXH (IV SOLUTION) ×1 IMPLANT
KIT DRSG VAC SLVR GRANUFM (MISCELLANEOUS) ×2 IMPLANT
KIT TURNOVER KIT A (KITS) ×2 IMPLANT
LABEL OR SOLS (LABEL) ×2 IMPLANT
MANIFOLD NEPTUNE II (INSTRUMENTS) ×2 IMPLANT
NEEDLE FILTER BLUNT 18X 1/2SAF (NEEDLE) ×1
NEEDLE FILTER BLUNT 18X1 1/2 (NEEDLE) ×1 IMPLANT
NEEDLE HYPO 25X1 1.5 SAFETY (NEEDLE) ×4 IMPLANT
NS IRRIG 500ML POUR BTL (IV SOLUTION) ×2 IMPLANT
PACK EXTREMITY ARMC (MISCELLANEOUS) ×2 IMPLANT
PAD ABD DERMACEA PRESS 5X9 (GAUZE/BANDAGES/DRESSINGS) ×2 IMPLANT
PENCIL ELECTRO HAND CTR (MISCELLANEOUS) ×2 IMPLANT
PULSAVAC PLUS IRRIG FAN TIP (DISPOSABLE) ×2
RASP SM TEAR CROSS CUT (RASP) IMPLANT
SHIELD FULL FACE ANTIFOG 7M (MISCELLANEOUS) ×2 IMPLANT
SOL PREP PVP 2OZ (MISCELLANEOUS) ×2
SOLUTION PREP PVP 2OZ (MISCELLANEOUS) ×1 IMPLANT
STOCKINETTE IMPERVIOUS 9X36 MD (GAUZE/BANDAGES/DRESSINGS) ×2 IMPLANT
SUT ETHILON 2 0 FS 18 (SUTURE) ×4 IMPLANT
SUT ETHILON 4-0 (SUTURE) ×1
SUT ETHILON 4-0 FS2 18XMFL BLK (SUTURE) ×1
SUT VIC AB 3-0 SH 27 (SUTURE) ×1
SUT VIC AB 3-0 SH 27X BRD (SUTURE) ×1 IMPLANT
SUT VIC AB 4-0 FS2 27 (SUTURE) ×2 IMPLANT
SUTURE ETHLN 4-0 FS2 18XMF BLK (SUTURE) ×1 IMPLANT
SWAB CULTURE AMIES ANAERIB BLU (MISCELLANEOUS) IMPLANT
SYR 10ML LL (SYRINGE) ×2 IMPLANT
SYR 3ML LL SCALE MARK (SYRINGE) ×2 IMPLANT
TIP FAN IRRIG PULSAVAC PLUS (DISPOSABLE) ×1 IMPLANT
UNDERPAD 30X36 HEAVY ABSORB (UNDERPADS AND DIAPERS) ×2 IMPLANT
WATER STERILE IRR 500ML POUR (IV SOLUTION) ×2 IMPLANT

## 2021-11-26 NOTE — Code Documentation (Signed)
Stroke Response Nurse Documentation Code Documentation  [name] is a 36 y.o. female arriving to [facility] ED via [facility] on 12/06/2021 with past medical hx of PVD, PD. On clopidogrel 75 mg daily. Code stroke was activated by EMS.   Patient from home where she was LKW at 1130 and now complaining of left sided weakness and difficulty speaking.    Stroke team at the bedside on patient arrival. Labs drawn and patient cleared for CT by Dr. Charna Archer. Patient to CT with team. NIHSS 5, see documentation for details and code stroke times. The following imaging was completed:  CT Head and MRI of head since pt is on dialysis. Patient is not a candidate for IV Thrombolytic due to Dr Rory Percy does not think that this is a stroke.     Bedside handoff with ED RN Jenny Reichmann.    Velta Addison Stroke Designer, fashion/clothing

## 2021-11-26 NOTE — H&P (Addendum)
History and Physical    [name]:[id number] DOB: January 08, 1986 DOA: 12/14/2021  PCP: Danelle Berry, NP  Patient coming from: Home.  History obtained from patient's mother, patient, ER physician and previous records.  Chief Complaint: Difficulty speaking.  HPI: [name] is a 36 y.o. female with history of peritoneal dialysis, diabetes mellitus presently not on medication, hypertension, peripheral vascular disease status post recent left foot partial amputation was brought to the ER the patient was found to have difficulty speaking at around 11:30 AM today.  Patient was originally scheduled to have a vascular procedure today and prior to coming for the procedure patient had taken antihypertensives following which patient became confused and difficulty to speak.  Patient was brought to the ER.  It is also noted that before being to the ER patient had some left-sided weakness and left-sided pronator drift.  Also the left-sided weakness was chronic.  As per patient's mother patient also had some mild discharge from the left partially amputated foot.  ED Course: In the ER patient was evaluated by neurologist.  Patient's speech has improved and the left-sided weakness also appears to be improving.  MRI brain showed acute and subacute CVA in multiple vascular territories.  Patient admitted for further stroke work-up.  At that time patient was noticed that patient also was having no pulse in the right foot with also nonhealing on the left foot for which vascular surgery was consulted and requested to be placed on heparin.  Patient did fail swallow evaluation.  EKG shows normal sinus rhythm.  Patient COVID test came back positive.  Review of Systems: As per HPI, rest all negative.   Past Medical History:  Diagnosis Date   Anemia of chronic disease    Asthma    well controlled-no inhaler   Calciphylaxis 2020   CHF (congestive heart failure) ([place])    CKD (chronic kidney  disease), stage III ([place])    Diabetes ([place])    type 2-diet controlled   Dyspnea    Hypertension    Morbid obesity ([place])    Nephrotic syndrome    PAD (peripheral artery disease) ([place])    Sepsis ([place]) 06/2021    Past Surgical History:  Procedure Laterality Date   A/V FISTULAGRAM Left 10/29/2020   Procedure: A/V FISTULAGRAM;  Surgeon: Algernon Huxley, MD;  Location: [facility];  Service: Cardiovascular;  Laterality: Left;   APPLICATION OF WOUND VAC Bilateral 09/15/2019   Procedure: APPLICATION OF WOUND VAC;  Surgeon: Benjamine Sprague, DO;  Location: [facility];  Service: General;  Laterality: Bilateral;   AV FISTULA PLACEMENT Left 07/09/2020   Procedure: ARTERIOVENOUS (AV) FISTULA CREATION ( BRACHIAL CEPHALIC);  Surgeon: Algernon Huxley, MD;  Location: [facility];  Service: Vascular;  Laterality: Left;   DIALYSIS/PERMA CATHETER INSERTION N/A 02/28/2019   Procedure: DIALYSIS/PERMA CATHETER INSERTION;  Surgeon: Algernon Huxley, MD;  Location: [facility];  Service: Cardiovascular;  Laterality: N/A;   DIALYSIS/PERMA CATHETER INSERTION N/A 09/17/2019   Procedure: DIALYSIS/PERMA CATHETER EXCHANGE;  Surgeon: Katha Cabal, MD;  Location: [place] [facility];  Service: Cardiovascular;  Laterality: N/A;   DIALYSIS/PERMA CATHETER REMOVAL N/A 03/18/2020   Procedure: DIALYSIS/PERMA CATHETER REMOVAL;  Surgeon: Algernon Huxley, MD;  Location: [facility];  Service: Cardiovascular;  Laterality: N/A;   LIGATION OF ARTERIOVENOUS  FISTULA Left 06/03/2021   Procedure: LIGATION OF ARTERIOVENOUS  FISTULA;  Surgeon: Algernon Huxley, MD;  Location: [facility];  Service: Vascular;  Laterality: Left;   LOWER EXTREMITY ANGIOGRAPHY Left 12/02/2020   Procedure: LOWER EXTREMITY ANGIOGRAPHY;  Surgeon: Algernon Huxley, MD;  Location: [facility];  Service: Cardiovascular;  Laterality: Left;   LOWER EXTREMITY ANGIOGRAPHY Right 01/25/2021   Procedure: LOWER EXTREMITY ANGIOGRAPHY;  Surgeon: Algernon Huxley, MD;   Location: [place] [facility];  Service: Cardiovascular;  Laterality: Right;   LOWER EXTREMITY ANGIOGRAPHY Left 04/15/2021   Procedure: LOWER EXTREMITY ANGIOGRAPHY;  Surgeon: Algernon Huxley, MD;  Location: [facility];  Service: Cardiovascular;  Laterality: Left;   LOWER EXTREMITY ANGIOGRAPHY Right 05/20/2021   Procedure: LOWER EXTREMITY ANGIOGRAPHY;  Surgeon: Algernon Huxley, MD;  Location: [place] [facility];  Service: Cardiovascular;  Laterality: Right;   LOWER EXTREMITY ANGIOGRAPHY Left 05/27/2021   Procedure: LOWER EXTREMITY ANGIOGRAPHY;  Surgeon: Algernon Huxley, MD;  Location: [place] [facility];  Service: Cardiovascular;  Laterality: Left;   LOWER EXTREMITY ANGIOGRAPHY Right 08/04/2021   Procedure: LOWER EXTREMITY ANGIOGRAPHY;  Surgeon: Algernon Huxley, MD;  Location: [facility];  Service: Cardiovascular;  Laterality: Right;   LOWER EXTREMITY ANGIOGRAPHY Left 11/15/2021   Procedure: LOWER EXTREMITY ANGIOGRAPHY;  Surgeon: Algernon Huxley, MD;  Location: [facility];  Service: Cardiovascular;  Laterality: Left;   pd cath  05/21/2019   TENDON LENGTHENING Left 09/08/2021   Procedure: TENDON ACHILLES LENGTHENING;  Surgeon: Criselda Peaches, DPM;  Location: [facility];  Service: Podiatry;  Laterality: Left;   TONSILLECTOMY     TRANSMETATARSAL AMPUTATION Left 09/08/2021   Procedure: TRANSMETATARSAL AMPUTATION;  Surgeon: Criselda Peaches, DPM;  Location: [facility];  Service: Podiatry;  Laterality: Left;   UPPER EXTREMITY ANGIOGRAPHY Left 04/15/2021   Procedure: UPPER EXTREMITY ANGIOGRAPHY;  Surgeon: Algernon Huxley, MD;  Location: [facility];  Service: Cardiovascular;  Laterality: Left;   WOUND DEBRIDEMENT Bilateral 09/15/2019   Procedure: DEBRIDEMENT WOUND;  Surgeon: Benjamine Sprague, DO;  Location: [facility];  Service: General;  Laterality: Bilateral;     reports that she has quit smoking. Her smoking use included cigars. She has never used smokeless tobacco. She reports that she  does not drink alcohol and does not use drugs.  Allergies  Allergen Reactions   Percocet [Oxycodone-Acetaminophen] Itching   Tomato Itching   Gabapentin Other (See Comments)    Extremity tremors    Family History  Problem Relation Age of Onset   Hypertension Mother    Multiple sclerosis Mother    CAD Mother    Cancer Maternal Grandmother        Ovarian    Cancer Maternal Grandfather        Prostate    Kidney disease Father     Prior to Admission medications   Medication Sig Start Date End Date Taking? Authorizing Provider  amLODipine (NORVASC) 10 MG tablet Take 10 mg by mouth as needed (high bp).    [provider]  aspirin EC 81 MG EC tablet Take 1 tablet (81 mg total) by mouth daily. 02/16/16   Demetrios Loll, MD  atorvastatin (LIPITOR) 20 MG tablet Take 1 tablet (20 mg total) by mouth daily. 11/15/21   Algernon Huxley, MD  calcium acetate (PHOSLO) 667 MG capsule Take 2,001 mg by mouth 3 (three) times daily before meals. 01/22/21   [provider]  carvedilol (COREG) 25 MG tablet Take 25 mg by mouth 2 (two) times daily as needed (high blood pressure).    [provider]  cilostazol (PLETAL)  100 MG tablet Take 1 tablet (100 mg total) by mouth 2 (two) times daily before a meal. Patient taking differently: Take 100 mg by mouth daily. 05/07/21   Kris Hartmann, NP  clopidogrel (PLAVIX) 75 MG tablet Take 1 tablet (75 mg total) by mouth daily. 11/15/21   Algernon Huxley, MD  isosorbide mononitrate (IMDUR) 30 MG 24 hr tablet Take 30 mg by mouth 2 (two) times daily as needed (high blood pressure).    [provider]  KLOR-CON M20 20 MEQ tablet Take 20 mEq by mouth daily. 10/20/20   [provider]  lactulose (CHRONULAC) 10 GM/15ML solution Take 30 mLs (20 g total) by mouth 2 (two) times daily as needed for mild constipation. 07/09/21   Danford, Suann Larry, MD  losartan (COZAAR) 100 MG tablet Take 100 mg by mouth daily as needed (high blood pressure).     [provider]  multivitamin (RENA-VIT) TABS tablet Take 1 tablet by mouth at bedtime. Patient not taking: Reported on 11/15/2021 09/18/19   Fritzi Mandes, MD  ondansetron (ZOFRAN) 4 MG tablet Take 1 tablet (4 mg total) by mouth every 6 (six) hours as needed for nausea. Patient not taking: Reported on 11/09/2021 09/08/21   Criselda Peaches, DPM  torsemide (DEMADEX) 100 MG tablet Take 100 mg by mouth every morning. 10/20/20   [provider]  Vitamin D, Ergocalciferol, (DRISDOL) 1.25 MG (50000 UNIT) CAPS capsule Take 50,000 Units by mouth once a week. 05/12/21   [provider]    Physical Exam: Constitutional: Moderately built and nourished. Vitals:   12/11/2021 1432 12/03/2021 1434 12/03/2021 1514  BP: (!) 87/75 (!) 85/74 95/74  Pulse: 89 88 92  Resp: 17 (!) 21 17  Temp: 98.8 F (37.1 C)  98.5 F (36.9 C)  TempSrc: Oral  Oral  SpO2: 95% (!) 88% 95%   Eyes: Anicteric no pallor. ENMT: No discharge from the ears eyes nose and mouth. Neck: No mass felt.  No neck rigidity. Respiratory: No rhonchi or crepitations. Cardiovascular: S1-S2 heard. Abdomen: Soft nontender bowel sound present. Musculoskeletal: Left foot has partial amputation which is nonhealing wound no active discharge seen but has discolored skin and also has discolored toes of the right foot.  No pulses appreciated in the lower extremity. Skin: Discolored skin of the toes of the right foot and also left foot stump looks discolored. Neurologic: Alert awake oriented to name and place left side appears mildly weak which per family was chronic.  Mild left facial droop.  Right upper and lower extremities 5 x 5. Psychiatric: Appears normal per normal affect.   Labs on Admission: I have personally reviewed following labs and imaging studies  CBC: Recent Labs  Lab 11/24/21 1005 12/02/2021 1320 11/20/2021 1412  WBC 16.9* 17.2* 16.6*  NEUTROABS  --  14.0* 14.0*  HGB 9.1* 9.2* 9.4*  HCT 28.0* 28.4* 29.6*  MCV  95.6 99.6 98.7  PLT 253 280 196   Basic Metabolic Panel: Recent Labs  Lab 11/24/21 1005 11/30/2021 1412  NA 136 140  K 3.1* 3.8  CL 94* 94*  CO2 19* 20*  GLUCOSE 110* 122*  BUN 69* 65*  CREATININE 12.79* 11.34*  CALCIUM 7.8* 8.3*   GFR: Estimated Creatinine Clearance: 9.7 mL/min (A) (by C-G formula based on SCr of 11.34 mg/dL (H)). Liver Function Tests: Recent Labs  Lab 11/22/2021 1412  AST 11*  ALT 7  ALKPHOS 131*  BILITOT 1.0  PROT 7.1  ALBUMIN 2.8*   No  results for input(s): LIPASE, AMYLASE in the last 168 hours. No results for input(s): AMMONIA in the last 168 hours. Coagulation Profile: No results for input(s): INR, PROTIME in the last 168 hours. Cardiac Enzymes: No results for input(s): CKTOTAL, CKMB, CKMBINDEX, TROPONINI in the last 168 hours. BNP (last 3 results) No results for input(s): PROBNP in the last 8760 hours. HbA1C: Recent Labs    11/24/21 1005  HGBA1C 5.0   CBG: Recent Labs  Lab 11/27/2021 1320  GLUCAP 115*   Lipid Profile: No results for input(s): CHOL, HDL, LDLCALC, TRIG, CHOLHDL, LDLDIRECT in the last 72 hours. Thyroid Function Tests: No results for input(s): TSH, T4TOTAL, FREET4, T3FREE, THYROIDAB in the last 72 hours. Anemia Panel: No results for input(s): VITAMINB12, FOLATE, FERRITIN, TIBC, IRON, RETICCTPCT in the last 72 hours. Urine analysis:    Component Value Date/Time   COLORURINE YELLOW (A) 02/11/2016 0200   APPEARANCEUR CLEAR (A) 02/11/2016 0200   LABSPEC 1.011 02/11/2016 0200   PHURINE 5.0 02/11/2016 0200   GLUCOSEU 50 (A) 02/11/2016 0200   HGBUR NEGATIVE 02/11/2016 0200   BILIRUBINUR NEGATIVE 02/11/2016 0200   KETONESUR NEGATIVE 02/11/2016 0200   PROTEINUR >500 (A) 02/11/2016 0200   NITRITE NEGATIVE 02/11/2016 0200   LEUKOCYTESUR NEGATIVE 02/11/2016 0200   Sepsis Labs: @LABRCNTIP (procalcitonin:4,lacticidven:4) ) Recent Results (from the past 240 hour(s))  SARS CORONAVIRUS 2 (TAT 6-24 HRS) Nasopharyngeal  Nasopharyngeal Swab     Status: None   Collection Time: 11/24/21  9:56 AM   Specimen: Nasopharyngeal Swab  Result Value Ref Range Status   SARS Coronavirus 2 NEGATIVE NEGATIVE Final    Comment: (NOTE) SARS-CoV-2 target nucleic acids are NOT DETECTED.  The SARS-CoV-2 RNA is generally detectable in upper and lower respiratory specimens during the acute phase of infection. Negative results do not preclude SARS-CoV-2 infection, do not rule out co-infections with other pathogens, and should not be used as the sole basis for treatment or other patient management decisions. Negative results must be combined with clinical observations, patient history, and epidemiological information. The expected result is Negative.  Fact Sheet for Patients: SugarRoll.be  Fact Sheet for Healthcare Providers: https://www.woods-mathews.com/  This test is not yet approved or cleared by the [place] FDA and  has been authorized for detection and/or diagnosis of SARS-CoV-2 by FDA under an Emergency Use Authorization (EUA). This EUA will remain  in effect (meaning this test can be used) for the duration of the COVID-19 declaration under Se ction 564(b)(1) of the Act, 21 U.S.C. section 360bbb-3(b)(1), unless the authorization is terminated or revoked sooner.  Performed at [facility], [place] [address], [place], [place] [address]   Resp Panel by RT-PCR (Flu A&B, Covid) Nasopharyngeal Swab     Status: Abnormal   Collection Time: 11/17/2021  2:12 PM   Specimen: Nasopharyngeal Swab; Nasopharyngeal(NP) swabs in vial transport medium  Result Value Ref Range Status   SARS Coronavirus 2 by RT PCR POSITIVE (A) NEGATIVE Final    Comment: (NOTE) SARS-CoV-2 target nucleic acids are DETECTED.  The SARS-CoV-2 RNA is generally detectable in upper respiratory specimens during the acute phase of infection. Positive results are indicative of the presence of the identified  virus, but do not rule out bacterial infection or co-infection with other pathogens not detected by the test. Clinical correlation with patient history and other diagnostic information is necessary to determine patient infection status. The expected result is Negative.  Fact Sheet for Patients: EntrepreneurPulse.com.au  Fact Sheet for Healthcare Providers: IncredibleEmployment.be  This test  is not yet approved or cleared by the [place] and  has been authorized for detection and/or diagnosis of SARS-CoV-2 by FDA under an Emergency Use Authorization (EUA).  This EUA will remain in effect (meaning this test can be used) for the duration of  the COVID-19 declaration under Section 564(b)(1) of the A ct, 21 U.S.C. section 360bbb-3(b)(1), unless the authorization is terminated or revoked sooner.     Influenza A by PCR NEGATIVE NEGATIVE Final   Influenza B by PCR NEGATIVE NEGATIVE Final    Comment: (NOTE) The Xpert Xpress SARS-CoV-2/FLU/RSV plus assay is intended as an aid in the diagnosis of influenza from Nasopharyngeal swab specimens and should not be used as a sole basis for treatment. Nasal washings and aspirates are unacceptable for Xpert Xpress SARS-CoV-2/FLU/RSV testing.  Fact Sheet for Patients: EntrepreneurPulse.com.au  Fact Sheet for Healthcare Providers: IncredibleEmployment.be  This test is not yet approved or cleared by the [place] FDA and has been authorized for detection and/or diagnosis of SARS-CoV-2 by FDA under an Emergency Use Authorization (EUA). This EUA will remain in effect (meaning this test can be used) for the duration of the COVID-19 declaration under Section 564(b)(1) of the Act, 21 U.S.C. section 360bbb-3(b)(1), unless the authorization is terminated or revoked.  Performed at [facility], [address] [place], [place] [address]      Radiological  Exams on Admission: MR BRAIN WO CONTRAST  Result Date: 11/25/2021 CLINICAL DATA:  Provided history: Neuro deficit, acute, stroke suspected. EXAM: MRI HEAD WITHOUT CONTRAST TECHNIQUE: Multiplanar, multiecho pulse sequences of the brain and surrounding structures were obtained without intravenous contrast. COMPARISON:  Noncontrast head CT performed immediately prior 11/27/2021. FINDINGS: Brain: An abbreviated protocol examination was performed at the provider's request. The following sequences were acquired: Axial and coronal diffusion-weighted imaging, axial T2 FLAIR sequence and axial SWI sequence. The axial T2 FLAIR sequence is moderately motion degraded, limiting evaluation. Subcentimeter acute cortically-based infarct within the posterior left frontal lobe (series 5, image 35). Subcentimeter acute cortically-based infarct within the lateral right frontal lobe (at the anterior aspect of the right sylvian fissure)(series 5, image 28). Patchy small acute infarcts within the callosal splenium on the right. 12 mm focus of diffusion-weighted hyperintensity within the mid left frontal lobe white matter (series 5, image 32). No definite ADC correlate is appreciated. However, this finding is present on both the axial and coronal diffusion-weighted sequences, and this may reflect a recent infarct. Similarly, there is an ill-defined focus of diffusion-weighted hyperintensity within the left frontoparietal periventricular white matter measuring 10 mm (series 5, image 31). No definite ADC correlate is appreciated. However, this finding is present on both the axial and coronal diffusion-weighted sequences, and this may reflect a recent infarct. Small chronic cortical/subcortical infarct within the right parietooccipital lobes. Chronic lacunar infarct within the left caudate nucleus with associated chronic hemosiderin deposition at this site. Multifocal T2 FLAIR hyperintense signal abnormality within the cerebral white  matter, nonspecific but compatible with chronic small vessel ischemic disease. These signal changes are overall mild, but advanced for age. No evidence of an intracranial mass or extra-axial fluid collection. No midline shift. Vascular: Flow voids poorly assessed in the absence of T2 TSE imaging. Skull and upper cervical spine: 1.9 cm bony protuberance projecting outward from the right frontal calvarium, likely reflecting an osteoma. Sinuses/Orbits: No acute orbital finding. Mild mucosal thickening within the bilateral ethmoid sinuses. Other: Trapped fluid within the left petrous apex. Impression #2 called by telephone at the time  of interpretation on 11/28/2021 at 2:05 pm to provider [facility] , who verbally acknowledged these results. IMPRESSION: 1. Abbreviated protocol motion degraded examination, as described. 2. Subcentimeter acute cortically-based infarcts within the lateral right frontal lobe and posterior left frontal lobe. Patchy small acute infarcts within the callosal splenium on the right. Additional small recent infarcts (measuring up to 12 mm) are questioned within the left frontoparietal white matter. Given small infarcts involving multiple vascular territories, there is suspicion for an embolic process. 3. Small chronic cortical/subcortical infarct within the right parietooccipital lobes. 4. Chronic hemorrhagic lacunar infarct within the left caudate nucleus. 5. Background chronic small-vessel ischemic changes within the cerebral white matter, mild but advanced for age. 6. 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 7. Mild mucosal thickening within the bilateral ethmoid sinuses. 8. Trapped fluid within the left petrous apex. Electronically Signed   By: Kellie Simmering D.O.   On: 12/07/2021 14:31   CT HEAD CODE STROKE WO CONTRAST  Result Date: 12/05/2021 CLINICAL DATA:  Code stroke. Provided history: Neuro deficit, acute, stroke suspected. Additional history provided:  Aphasia, left-sided weakness. Last known normal 11:30 a.m. EXAM: CT HEAD WITHOUT CONTRAST TECHNIQUE: Contiguous axial images were obtained from the base of the skull through the vertex without intravenous contrast. RADIATION DOSE REDUCTION: This exam was performed according to the departmental dose-optimization program which includes automated exposure control, adjustment of the mA and/or kV according to patient size and/or use of iterative reconstruction technique. COMPARISON:  Report from head CT 02/21/2002 (images unavailable). FINDINGS: Brain: Mild generalized cerebral atrophy, advanced for age. Small chronic cortical/subcortical infarct within the right parietooccipital lobes. Patchy and ill-defined hypoattenuation within the cerebral white matter, nonspecific but compatible with chronic small vessel ischemic disease. Findings are overall mild, but advanced for age. Small age-indeterminate lacunar infarct within the anterior limb of right internal capsule (series 3, image 18) (series 5, image 32). 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 Scattered supratentorial calcifications, some many of which appear parenchymal. There is no acute intracranial hemorrhage. No acute demarcated cortical infarct is identified. No extra-axial fluid collection. No evidence of an intracranial mass. No midline shift. Vascular: No hyperdense vessel. Atherosclerotic calcifications. Skull: No calvarial fracture. Bony protuberance projecting outward from the right frontal calvarium, measuring 1.9 x 1.1 cm in transaxial dimensions, compatible with an osteoma (series 4, image 42). Sinuses/Orbits: Visualized orbits show no acute finding. No significant paranasal sinus disease. ASPECTS ([place] Stroke Program Early CT Score) - Ganglionic level infarction (caudate, lentiform nuclei, internal capsule, insula, M1-M3 cortex): 6 - Supraganglionic infarction (M4-M6 cortex): 3 Total score (0-10 with 10 being normal): 9 Impressions  #1 and #2 were communicated to Dr. Rory Percy at 1:50 pmon 11/17/2021 by text page via the [facility] messaging system. IMPRESSION: 1. No acute intracranial hemorrhage or acute demarcated cortical infarct. 2. Age-indeterminate lacunar infarct within the anterior limb of right internal capsule. 3. Small chronic cortical/subcortical infarct within the right parietooccipital lobes. 4. 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 5. Chronic small-vessel ischemic changes within the cerebral white matter, overall mild but advanced for age. 6. Scattered punctate supratentorial parenchymal calcifications. Findings are nonspecific, but may be seen as sequela of neurocysticercosis. 7. Mild cerebral atrophy, advanced for age. 8. 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 Electronically Signed   By: Kellie Simmering D.O.   On: 11/18/2021 13:51    EKG: Independently reviewed.  Normal sinus rhythm.  Assessment/Plan Principal Problem:   Acute CVA (cerebrovascular  accident) [facility]) Active Problems:   Iron deficiency anemia   ESRD (end stage renal disease) ([place])   Ischemic foot    Acute CVA -appreciate neurology consult.  Patient at this time is placed on neurochecks.  Patient did fail swallow evaluation for which we will get speech therapy evaluation.  We will keep patient on antiplatelet agents.  Carotid Dopplers 2D echo check hemoglobin A1c lipid panel.  Allow for permissive hypertension.  Note that patient is on heparin now. Ischemic right foot and nonhealing left foot wound for which vascular surgery has been consulted and patient has been placed on heparin infusion.  Since patient also has leukocytosis and also patient's mother states that they are seeing some discharge from the left foot we will keep patient on empiric antibiotics.  Check X Ray of left foot. Will await further recommendations from Vascular surgery. Dr.Dew was notified by ER Physician. COVID-19 infection presently not hypoxic  -we will get chest x-ray.  Patient and mother has agreed for remdesivir.  Check inflammatory markers closely follow respiratory status. ESRD on peritoneal dialysis nephrology has been notified. Chronic anemia likely from ESRD follow CBC. Diabetes mellitus type 2 presently not on any medication for diabetes.  Recent hemoglobin A1c was 5.  We will closely follow CBGs since patient is n.p.o. History of hypertension we will allow for permissive hypertension hold antihypertensives.  As needed IV hydralazine for systolic more than 944.  Since patient has acute CVA with COVID infection and peripheral vascular disease with ischemic foot will need close monitoring and inpatient status.   DVT prophylaxis: Heparin infusion. Code Status: Full code. Family Communication: Patient's mother. Disposition Plan: To be determined. Consults called: Neurology, vascular surgery, nephrology. Admission status: Inpatient.   Rise Patience MD [facility] Hospitalists Pager [phone].  If 7PM-7AM, please contact night-coverage [email or web address] Password [facility]  11/18/2021, 5:49 PM

## 2021-11-26 NOTE — Consult Note (Signed)
Remdesivir - Pharmacy Brief Note   O:  ALT: 7 CXR: N/A SpO2: 95% on 2LNC   A/P:  Remdesivir 200 mg IVPB once followed by 100 mg IVPB daily x 4 days.   [name], PharmD Clinical Pharmacist 11/19/2021 5:58 PM

## 2021-11-26 NOTE — Progress Notes (Signed)
Contacted by Dr. Charna Archer regarding concern for lower extremity ischemia, likely embolic and question of whether heparin should be okay. From a neurological standpoint, okay to use heparin stroke protocol-low PTT/low heparin level without bolus.  -- Amie Portland, MD Neurologist [facility] Pager: [phone]

## 2021-11-26 NOTE — Consult Note (Addendum)
ANTICOAGULATION CONSULT NOTE - Initial Consult  Pharmacy Consult for heparin Indication:  Thrombus  Allergies  Allergen Reactions   Percocet [Oxycodone-Acetaminophen] Itching   Tomato Itching   Gabapentin Other (See Comments)    Extremity tremors    Patient Measurements:   Heparin Dosing Weight: 92.3 Kg  Vital Signs: Temp: 98.5 F (36.9 C) (02/10 1514) Temp Source: Oral (02/10 1514) BP: 95/74 (02/10 1514) Pulse Rate: 92 (02/10 1514)  Labs: Recent Labs    11/24/21 1005 12/02/2021 1320 11/23/2021 1412  HGB 9.1* 9.2* 9.4*  HCT 28.0* 28.4* 29.6*  PLT 253 280 247  CREATININE 12.79*  --  11.34*    Estimated Creatinine Clearance: 9.7 mL/min (A) (by C-G formula based on SCr of 11.34 mg/dL (H)).   Medical History: Past Medical History:  Diagnosis Date   Anemia of chronic disease    Asthma    well controlled-no inhaler   Calciphylaxis 2020   CHF (congestive heart failure) ([place])    CKD (chronic kidney disease), stage III ([place])    Diabetes ([place])    type 2-diet controlled   Dyspnea    Hypertension    Morbid obesity ([place])    Nephrotic syndrome    PAD (peripheral artery disease) ([place])    Sepsis ([place]) 06/2021    Medications:  No chronic anticoagulation PTA  Assessment: Pharmacy has been consulted to initiate Heparin in 35yo patient admitted with stroke-like symptoms. Her symptoms began to resolve, but neurologist noted concern for lower extremity ischemia, likely embolic.   Goal of Therapy:  Heparin level 0.3-0.5 units/ml(Per Stroke Protocol) Monitor platelets by anticoagulation protocol: Yes   Plan:  Per Neurologist, will avoid bolus x 24 hours Start heparin infusion at 1500 units/hr Check anti-Xa level in 8 hours and daily while on heparin Continue to monitor H&H and platelets  Lyric Hoar A Daveon Arpino 11/25/2021,3:45 PM

## 2021-11-26 NOTE — Consult Note (Signed)
[place] for heparin Indication:  Thrombus  Allergies  Allergen Reactions   Percocet [Oxycodone-Acetaminophen] Itching   Tomato Itching   Gabapentin Other (See Comments)    Extremity tremors    Patient Measurements:   Heparin Dosing Weight: 92.3 Kg  Vital Signs: Temp: 97.9 F (36.6 C) (02/10 2110) Temp Source: Oral (02/10 2110) BP: 98/64 (02/10 2110) Pulse Rate: 92 (02/10 2110)  Labs: Recent Labs    11/24/21 1005 11/17/2021 1320 11/28/2021 1412 11/23/2021 2222  HGB 9.1* 9.2* 9.4*  --   HCT 28.0* 28.4* 29.6*  --   PLT 253 280 247  --   APTT  --   --   --  38*  LABPROT  --   --   --  16.3*  INR  --   --   --  1.3*  HEPARINUNFRC  --   --   --  <0.10*  CREATININE 12.79*  --  11.34*  --      Estimated Creatinine Clearance: 9.7 mL/min (A) (by C-G formula based on SCr of 11.34 mg/dL (H)).   Medical History: Past Medical History:  Diagnosis Date   Anemia of chronic disease    Asthma    well controlled-no inhaler   Calciphylaxis 2020   CHF (congestive heart failure) ([place])    CKD (chronic kidney disease), stage III ([place])    Diabetes ([place])    type 2-diet controlled   Dyspnea    Hypertension    Morbid obesity ([place])    Nephrotic syndrome    PAD (peripheral artery disease) ([place])    Sepsis ([place]) 06/2021    Medications:  No chronic anticoagulation PTA  Assessment: Pharmacy has been consulted to initiate Heparin in 35yo patient admitted with stroke-like symptoms. Her symptoms began to resolve, but neurologist noted concern for lower extremity ischemia, likely embolic.   Goal of Therapy:  Heparin level 0.3-0.5 units/ml(Per Stroke Protocol) Monitor platelets by anticoagulation protocol: Yes  2/10 2222 HL < 0.10, subtherapeutic   Plan:  Per Neurologist, will avoid bolus x 24 hours Increase heparin infusion to 1750 units/hr Recheck HL in 8 hours after rate change Continue to monitor H&H and platelets  Renda Rolls,  PharmD, [facility] 12/10/2021 11:58 PM

## 2021-11-26 NOTE — ED Triage Notes (Signed)
Left side deficit started 1130 today , pt a pd dialysis patient,

## 2021-11-26 NOTE — Consult Note (Signed)
Neurology Consultation  Reason for Consult: Code stroke-left-sided weakness, aphasia Referring Physician: Dr. Charna Archer  CC: Altered mental status, left-sided weakness  History is obtained from: Chart review, EMS, patient  HPI: [name] is a 36 y.o. female significant past medical history of ESRD on peritoneal dialysis, diabetes, hypertension, peripheral arterial disease, morbid obesity, brought in by EMS as a code stroke for left-sided weakness and aphasia. EMS was called at patient's house for evaluation of altered mental status.  She was normal this morning when she woke up with a last known well somewhere around 11:30 AM and later in the afternoon found to be altered and confused.  On their evaluation, she had drift in the left upper extremity and was completely nonverbal. Due to her being within the window for intervention, she was brought in as acute code stroke. When patient was seen at the ER bridge, she was able to communicate with me, tell me her name, correct age, correct month and name simple objects.  Mildly dysarthric with significant left lower extremity weakness which the patient says is somewhat worse than the usual weakness that she has had in the left leg since her surgery in November. 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  Her mother arrived later on and confirmed the story as above.  She woke up normal this morning.  Sometime around 1130 or so took her multiple antihypertensive medications and since then was feeling very groggy which is unlike her.  The only neurological symptom that the mother could think of that the patient had been complaining prior to this change at 1130 was some numbness in her left leg since last night before going to bed.  At baseline, she is wheelchair-bound requiring quite a lot of help during the day which her mother provides.  Given that her left-sided symptoms are acute  on chronic, I decided to take her for a MRI of the head stat which showed DWI hyperintensities in multiple vascular territories, small and punctate looking, both cortical and subcortical as well as some of them with faint FLAIR correlates indicating that some of the strokes are already established.  LKW: 11:30 AM today with some left-sided numbness noted last night. tpa given?: no, established strokes Premorbid modified Rankin scale (mRS): 4  ROS: Unable to obtain due to altered mental status.   Past Medical History:  Diagnosis Date   Anemia of chronic disease    Asthma    well controlled-no inhaler   Calciphylaxis 2020   CHF (congestive heart failure) ([place])    CKD (chronic kidney disease), stage III ([place])    Diabetes ([place])    type 2-diet controlled   Dyspnea    Hypertension    Morbid obesity ([place])    Nephrotic syndrome    PAD (peripheral artery disease) ([place])    Sepsis ([place]) 06/2021   Family History  Problem Relation Age of Onset   Hypertension Mother    Multiple sclerosis Mother    CAD Mother    Cancer Maternal Grandmother        Ovarian    Cancer Maternal Grandfather        Prostate    Kidney disease Father      Social History:   reports that she has quit smoking. Her smoking use included cigars. She has never used smokeless tobacco. She reports that she does not drink alcohol and does not use drugs.  Medications No current facility-administered medications  for this encounter.  Current Outpatient Medications:    amLODipine (NORVASC) 10 MG tablet, Take 10 mg by mouth as needed (high bp)., Disp: , Rfl:    aspirin EC 81 MG EC tablet, Take 1 tablet (81 mg total) by mouth daily., Disp: 30 tablet, Rfl: 2   atorvastatin (LIPITOR) 20 MG tablet, Take 1 tablet (20 mg total) by mouth daily., Disp: 30 tablet, Rfl: 5   calcium acetate (PHOSLO) 667 MG capsule, Take 2,001 mg by mouth 3 (three) times daily before meals., Disp: , Rfl:    carvedilol (COREG) 25 MG tablet, Take 25 mg  by mouth 2 (two) times daily as needed (high blood pressure)., Disp: , Rfl:    cilostazol (PLETAL) 100 MG tablet, Take 1 tablet (100 mg total) by mouth 2 (two) times daily before a meal. (Patient taking differently: Take 100 mg by mouth daily.), Disp: 60 tablet, Rfl: 11   clopidogrel (PLAVIX) 75 MG tablet, Take 1 tablet (75 mg total) by mouth daily., Disp: 30 tablet, Rfl: 11   doxycycline (VIBRA-TABS) 100 MG tablet, Take 1 tablet (100 mg total) by mouth 2 (two) times daily. (Patient not taking: Reported on 11/09/2021), Disp: 28 tablet, Rfl: 0   HYDROcodone-acetaminophen (NORCO/VICODIN) 5-325 MG tablet, Take one to two tablets every six to eight hours as needed for pain. (Patient not taking: Reported on 11/09/2021), Disp: 20 tablet, Rfl: 0   isosorbide mononitrate (IMDUR) 30 MG 24 hr tablet, Take 30 mg by mouth 2 (two) times daily as needed (high blood pressure)., Disp: , Rfl:    KLOR-CON M20 20 MEQ tablet, Take 20 mEq by mouth daily., Disp: , Rfl:    lactulose (CHRONULAC) 10 GM/15ML solution, Take 30 mLs (20 g total) by mouth 2 (two) times daily as needed for mild constipation., Disp: 900 mL, Rfl: 3   losartan (COZAAR) 100 MG tablet, Take 100 mg by mouth daily as needed (high blood pressure)., Disp: , Rfl:    multivitamin (RENA-VIT) TABS tablet, Take 1 tablet by mouth at bedtime. (Patient not taking: Reported on 11/15/2021), Disp: 30 tablet, Rfl: 0   ondansetron (ZOFRAN) 4 MG tablet, Take 1 tablet (4 mg total) by mouth every 6 (six) hours as needed for nausea. (Patient not taking: Reported on 11/09/2021), Disp: 20 tablet, Rfl: 0   torsemide (DEMADEX) 100 MG tablet, Take 100 mg by mouth every morning., Disp: , Rfl:    Vitamin D, Ergocalciferol, (DRISDOL) 1.25 MG (50000 UNIT) CAPS capsule, Take 50,000 Units by mouth once a week., Disp: , Rfl:    Exam: Current vital signs: BP (!) 87/75 (BP Location: Right Arm)    Pulse 89    Temp 98.8 F (37.1 C) (Oral)    Resp 17    SpO2 95%  Vital signs in last 24  hours: Temp:  [98.8 F (37.1 C)] 98.8 F (37.1 C) (02/10 1432) Pulse Rate:  [89] 89 (02/10 1432) Resp:  [17] 17 (02/10 1432) BP: (87)/(75) 87/75 (02/10 1432) SpO2:  [95 %] 95 % (02/10 1432)  General: Awake alert in no distress HEENT: Normocephalic atraumatic Lungs: Distant breath sounds bilaterally CVS: Regular rhythm Abdomen: Obese, nontender Extremities: Puckered and macerated looking skin in bilateral lower extremities with a large heel ulcer on the left shin and both feet in bandages. Neurological exam Awake alert in no distress Speech is mildly dysarthric naming is intact.  Repetition is intact.  Comprehension is intact. Somewhat diminished attention concentration Cranial nerves: Pupils equal round react light, extraocular movements intact, visual  fields full, face appears mildly asymmetric with subtle left lower facial weakness at rest which she was able to overcome when she smiles, tongue and palate midline. Motor examination with no drift in both upper extremities.  No drift in the right lower extremity.  Left lower extremity initially was flicker movement but later she was able to give me at least antigravity strength with drift. Sensory exam: Intact to touch Coordination difficult to perform  Initial NIH stroke scale-5  Labs I have reviewed labs in epic and the results pertinent to this consultation are:  CBC    Component Value Date/Time   WBC 16.6 (H) 12/02/2021 1412   RBC 3.00 (L) 12/03/2021 1412   HGB 9.4 (L) 11/19/2021 1412   HGB 12.5 12/31/2013 1020   HCT 29.6 (L) 11/20/2021 1412   HCT 38.5 12/31/2013 1020   PLT 247 12/07/2021 1412   PLT 239 12/31/2013 1020   MCV 98.7 11/28/2021 1412   MCV 84 12/31/2013 1020   MCH 31.3 11/25/2021 1412   MCHC 31.8 11/29/2021 1412   RDW 17.9 (H) 11/17/2021 1412   RDW 15.2 (H) 12/31/2013 1020   LYMPHSABS 1.3 11/30/2021 1412   MONOABS 0.6 12/12/2021 1412   EOSABS 0.5 12/02/2021 1412   BASOSABS 0.1 11/30/2021 1412   CMP      Component Value Date/Time   NA 136 11/24/2021 1005   NA 135 (L) 12/31/2013 1020   K 3.1 (L) 11/24/2021 1005   K 3.9 12/31/2013 1020   CL 94 (L) 11/24/2021 1005   CL 105 12/31/2013 1020   CO2 19 (L) 11/24/2021 1005   CO2 26 12/31/2013 1020   GLUCOSE 110 (H) 11/24/2021 1005   GLUCOSE 243 (H) 12/31/2013 1020   BUN 69 (H) 11/24/2021 1005   BUN 19 (H) 12/31/2013 1020   CREATININE 12.79 (H) 11/24/2021 1005   CREATININE 2.12 (H) 12/31/2013 1020   CALCIUM 7.8 (L) 11/24/2021 1005   CALCIUM 8.5 12/31/2013 1020   PROT 8.6 (H) 07/08/2021 0638   PROT 7.4 12/31/2013 1020   ALBUMIN 3.4 (L) 07/08/2021 0638   ALBUMIN 3.1 (L) 12/31/2013 1020   AST 16 07/08/2021 0638   AST 21 12/31/2013 1020   ALT 10 07/08/2021 0638   ALT 26 12/31/2013 1020   ALKPHOS 137 (H) 07/08/2021 0638   ALKPHOS 98 12/31/2013 1020   BILITOT 0.9 07/08/2021 0638   BILITOT 0.4 12/31/2013 1020   GFRNONAA 4 (L) 11/24/2021 1005   GFRNONAA 31 (L) 12/31/2013 1020   GFRAA 5 (L) 06/18/2020 1217   GFRAA 36 (L) 12/31/2013 1020   Lipid Panel     Component Value Date/Time   CHOL 89 02/12/2016 0449   TRIG 65 02/12/2016 0449   HDL 30 (L) 02/12/2016 0449   CHOLHDL 3.0 02/12/2016 0449   VLDL 13 02/12/2016 0449   LDLCALC 46 02/12/2016 0449   Imaging I have reviewed the images obtained:  CT-head-no acute changes.  No bleed.  MRI examination of the brain-scattered infarcts in the lateral right frontal lobe, posterior left frontal lobe with patchy acute small infarcts in the callosal splenium on the right with additional subacute infarcts question within the left frontoparietal white matter.  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  Background chronic small vessel ischemic changes within the cerebral white matter.  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  Small chronic cortical subcortical infarcts within the right parietal occipital lobes.  Assessment:  36 year old woman with an  extensive history of vascular  risk factors including ESRD, peripheral arterial disease presented to the emergency room for evaluation of strokelike symptoms and altered mental status. Essentially EMS was called to evaluate her for altered mental status which the mother thinks started after she took all her antihypertensives. On EMS evaluation, she had some left-sided arm drift and speech difficulty. On my evaluation, she had some left leg weakness, mild left facial droop and mild dysarthria along with mildly encephalopathic looking exam. Given her history of ESRD, hard to localize and pinpoint the exam-hence taken for emergent MRI which showed acute and subacute looking scattered infarcts in multiple vascular territories concerning for embolic process. He would benefit from inpatient work-up for stroke risk factors.  She is not a candidate for IV TNKase due to different ages of strokes some of which appear to be more subacute than acute. Her baseline modified Rankin score is 4 which precludes her from endovascular intervention hence I did not pursue CT angiography-also because her renal function is extremely impaired and she is on peritoneal dialysis.    Impression: Acute ischemic and subacute ischemic strokes ESRD Peripheral arterial disease Diabetes Hypertension   Recommendations: Admit to hospitalist Frequent neurochecks Telemetry 2D echo MRA head Carotid Dopplers She is on Plavix at home-I would continue that for now. High intensity statin PT Speech therapy OT May need nephrology consultation for dialysis Due to the embolic nature of the strokes, if the TTE is unremarkable, she might need a TEE. She also has leukocytosis-I will check urinalysis, chest x-ray as well as blood cultures. Permissive hypertension-although the clinical last known well is sometime today, the different ages of strokes on the MRI could mean that she has had some of these changes for a few days.  I would  suggest normotension as her blood pressure goal for now with systolic blood pressure goal of 100-140. I will follow with you. Plan was discussed with Dr. Charna Archer in the ER.  Plan was also discussed with the patient and her mother at bedside -- Amie Portland, MD Neurologist [facility] Pager: [phone]

## 2021-11-26 NOTE — Anesthesia Preprocedure Evaluation (Deleted)
Anesthesia Evaluation  Patient identified by MRN, date of birth, ID band Patient awake    Reviewed: Allergy & Precautions, H&P , NPO status , Patient's Chart, lab work & pertinent test results, reviewed documented beta blocker date and time   Airway Mallampati: II  TM Distance: >3 FB Neck ROM: full    Dental  (+) Teeth Intact   Pulmonary shortness of breath, asthma , former smoker,    Pulmonary exam normal        Cardiovascular Exercise Tolerance: Poor hypertension, On Medications + Peripheral Vascular Disease and +CHF  Normal cardiovascular exam Rhythm:regular Rate:Normal     Neuro/Psych negative neurological ROS  negative psych ROS   GI/Hepatic negative GI ROS, Neg liver ROS,   Endo/Other  negative endocrine ROSdiabetes, Poorly Controlled, Type 2  Renal/GU ESRFRenal disease  negative genitourinary   Musculoskeletal   Abdominal   Peds  Hematology  (+) Blood dyscrasia, anemia ,   Anesthesia Other Findings Past Medical History: No date: Anemia of chronic disease No date: Asthma     Comment:  well controlled-no inhaler 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 No date: CHF (congestive heart failure) (HCC) No date: CKD (chronic kidney disease), stage III (HCC) No date: Diabetes ([place])     Comment:  type 2-diet controlled No date: Dyspnea No date: Hypertension No date: Morbid obesity ([place]) No date: Nephrotic syndrome No date: PAD (peripheral artery disease) ([place]) 06/2021: Sepsis [facility]) Past Surgical History: 10/29/2020: A/V FISTULAGRAM; Left     Comment:  Procedure: A/V FISTULAGRAM;  Surgeon: Algernon Huxley, MD;               Location: [place] [facility];  Service: Cardiovascular;              Laterality: Left; 16/07/9603: APPLICATION OF WOUND VAC; Bilateral     Comment:  Procedure: APPLICATION OF WOUND VAC;  Surgeon: Benjamine Sprague, DO;  Location: [facility];  Service: General;                Laterality:  Bilateral; 07/09/2020: AV FISTULA PLACEMENT; Left     Comment:  Procedure: ARTERIOVENOUS (AV) FISTULA CREATION (               BRACHIAL CEPHALIC);  Surgeon: Algernon Huxley, MD;                Location: [facility] OR[facility];  Service: Vascular;  Laterality:               Left; 02/28/2019: DIALYSIS/PERMA CATHETER INSERTION; N/A     Comment:  Procedure: DIALYSIS/PERMA CATHETER INSERTION;  Surgeon:               Algernon Huxley, MD;  Location: [place] [facility];                Service: Cardiovascular;  Laterality: N/A; 09/17/2019: DIALYSIS/PERMA CATHETER INSERTION; N/A     Comment:  Procedure: DIALYSIS/PERMA CATHETER EXCHANGE;  Surgeon:               Katha Cabal, MD;  Location: [place] [facility];               Service: Cardiovascular;  Laterality: N/A; 03/18/2020: DIALYSIS/PERMA CATHETER REMOVAL; N/A     Comment:  Procedure: DIALYSIS/PERMA CATHETER REMOVAL;  Surgeon:               Algernon Huxley, MD;  Location: [place] IN[facility]IVE CV  LAB;                Service: Cardiovascular;  Laterality: N/A; 06/03/2021: LIGATION OF ARTERIOVENOUS  FISTULA; Left     Comment:  Procedure: LIGATION OF ARTERIOVENOUS  FISTULA;  Surgeon:              Algernon Huxley, MD;  Location: [facility] OR[facility];  Service:               Vascular;  Laterality: Left; 12/02/2020: LOWER EXTREMITY ANGIOGRAPHY; Left     Comment:  Procedure: LOWER EXTREMITY ANGIOGRAPHY;  Surgeon: Algernon Huxley, MD;  Location: [place] [facility];  Service:               Cardiovascular;  Laterality: Left; 01/25/2021: LOWER EXTREMITY ANGIOGRAPHY; Right     Comment:  Procedure: LOWER EXTREMITY ANGIOGRAPHY;  Surgeon: Algernon Huxley, MD;  Location: [place] [facility];  Service:               Cardiovascular;  Laterality: Right; 04/15/2021: LOWER EXTREMITY ANGIOGRAPHY; Left     Comment:  Procedure: LOWER EXTREMITY ANGIOGRAPHY;  Surgeon: Algernon Huxley, MD;  Location: [place] [facility];  Service:               Cardiovascular;  Laterality:  Left; 05/20/2021: LOWER EXTREMITY ANGIOGRAPHY; Right     Comment:  Procedure: LOWER EXTREMITY ANGIOGRAPHY;  Surgeon: Algernon Huxley, MD;  Location: [place] [facility];  Service:               Cardiovascular;  Laterality: Right; 05/27/2021: LOWER EXTREMITY ANGIOGRAPHY; Left     Comment:  Procedure: LOWER EXTREMITY ANGIOGRAPHY;  Surgeon: Algernon Huxley, MD;  Location: [place] [facility];  Service:               Cardiovascular;  Laterality: Left; 08/04/2021: LOWER EXTREMITY ANGIOGRAPHY; Right     Comment:  Procedure: LOWER EXTREMITY ANGIOGRAPHY;  Surgeon: Algernon Huxley, MD;  Location: [place] [facility];  Service:               Cardiovascular;  Laterality: Right; 11/15/2021: LOWER EXTREMITY ANGIOGRAPHY; Left     Comment:  Procedure: LOWER EXTREMITY ANGIOGRAPHY;  Surgeon: Algernon Huxley, MD;  Location: [place] [facility];  Service:               Cardiovascular;  Laterality: Left; 05/21/2019: pd cath 09/08/2021: TENDON LENGTHENING; Left     Comment:  Procedure: TENDON ACHILLES LENGTHENING;  Surgeon:               Criselda Peaches, DPM;  Location: [facility];  Service:               Podiatry;  Laterality: Left; No date: TONSILLECTOMY 09/08/2021: TRANSMETATARSAL AMPUTATION; Left     Comment:  Procedure: TRANSMETATARSAL AMPUTATION;  Surgeon:  Criselda Peaches, DPM;  Location: [facility];  Service:               Podiatry;  Laterality: Left; 04/15/2021: UPPER EXTREMITY ANGIOGRAPHY; Left     Comment:  Procedure: UPPER EXTREMITY ANGIOGRAPHY;  Surgeon: Algernon Huxley, MD;  Location: [place] [facility];  Service:               Cardiovascular;  Laterality: Left; 09/15/2019: WOUND DEBRIDEMENT; Bilateral     Comment:  Procedure: DEBRIDEMENT WOUND;  Surgeon: Benjamine Sprague,               DO;  Location: [facility];  Service: General;  Laterality:               Bilateral;   Reproductive/Obstetrics negative OB ROS                              Anesthesia Physical Anesthesia Plan  ASA: 3 and emergent  Anesthesia Plan: General ETT   Post-op Pain Management:    Induction:   PONV Risk Score and Plan: 4 or greater  Airway Management Planned:   Additional Equipment:   Intra-op Plan:   Post-operative Plan:   Informed Consent:   Plan Discussed with: CRNA  Anesthesia Plan Comments:         Anesthesia Quick Evaluation

## 2021-11-26 NOTE — ED Provider Notes (Signed)
[facility] Provider Note    Event Date/Time   First MD Initiated Contact with Patient 11/18/2021 1320     (approximate)   History   Chief Complaint Stroke Symptoms (Left side deficit started 1130 today , pt a pd dialysis patient, )   HPI  [name] is a 36 y.o. female with past medical history of hypertension, CHF, anemia, asthma, PAD, and ESRD on PD who presents to the ED for possible stroke.  History is limited as patient currently presents with slurred speech and majority of history is obtained from mother.  She states that the patient developed slurred speech acutely around noon today, also seem to be having some difficulty moving her left side at that time.  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  She only has been dealing with intermittent pain in her right foot as well and is scheduled for endovascular intervention on her right leg.  She currently takes Plavix for per mother.  Due to concern for acute neurologic deficits, code stroke was activated prehospital.     Physical Exam   Triage Vital Signs: ED Triage Vitals  Enc Vitals Group     BP      Pulse      Resp      Temp      Temp src      SpO2      Weight      Height      Head Circumference      Peak Flow      Pain Score      Pain Loc      Pain Edu?      Excl. in [place]?     Most recent vital signs: Vitals:   12/03/2021 1434 12/13/2021 1514  BP: (!) 85/74 95/74  Pulse: 88 92  Resp: (!) 21 17  Temp:  98.5 F (36.9 C)  SpO2: (!) 88% 95%    Constitutional: Alert and oriented. Eyes: Conjunctivae are normal. Head: Atraumatic. Nose: No congestion/rhinnorhea. Mouth/Throat: Mucous membranes are moist.  Cardiovascular: Normal rate, regular rhythm. Grossly normal heart sounds.  1+ DP pulse on left, no palpable pulses on right foot. Respiratory: Normal respiratory effort.  No retractions.  Lungs CTAB. Gastrointestinal: Soft and nontender. No distention.  PD catheter in place. Musculoskeletal: Status post left TMA with poorly healing wound, no purulence or erythema noted.  Mottling noted to toes of right foot extending over the distal dorsum of right foot. Neurologic: Slurred speech noted.  No gross focal neurologic deficits are appreciated.    ED Results / Procedures / Treatments   Labs (all labs ordered are listed, but only abnormal results are displayed) Labs Reviewed  CBC - Abnormal; Notable for the following components:      Result Value   WBC 17.2 (*)    RBC 2.85 (*)    Hemoglobin 9.2 (*)    HCT 28.4 (*)    RDW 18.0 (*)    All other components within normal limits  DIFFERENTIAL - Abnormal; Notable for the following components:   Neutro Abs 14.0 (*)    Eosinophils Absolute 0.6 (*)    Abs Immature Granulocytes 0.13 (*)    All other components within normal limits  CBC WITH DIFFERENTIAL/PLATELET - Abnormal; Notable for the following components:   WBC 16.6 (*)    RBC 3.00 (*)    Hemoglobin 9.4 (*)  HCT 29.6 (*)    RDW 17.9 (*)    Neutro Abs 14.0 (*)    Abs Immature Granulocytes 0.10 (*)    All other components within normal limits  COMPREHENSIVE METABOLIC PANEL - Abnormal; Notable for the following components:   Chloride 94 (*)    CO2 20 (*)    Glucose, Bld 122 (*)    BUN 65 (*)    Creatinine, Ser 11.34 (*)    Calcium 8.3 (*)    Albumin 2.8 (*)    AST 11 (*)    Alkaline Phosphatase 131 (*)    GFR, Estimated 4 (*)    Anion gap 26 (*)    All other components within normal limits  CBG MONITORING, ED - Abnormal; Notable for the following components:   Glucose-Capillary 115 (*)    All other components within normal limits  RESP PANEL BY RT-PCR (FLU A&B, COVID) ARPGX2  ETHANOL  URINE DRUG SCREEN, QUALITATIVE ([facility] ONLY)  URINALYSIS, ROUTINE W REFLEX MICROSCOPIC  APTT  PROTIME-INR  POC URINE PREG, ED     EKG  ED ECG REPORT I, Blake Divine,  the attending physician, personally viewed and interpreted this ECG.   Date: 11/17/2021  EKG Time: 14:37  Rate: 94  Rhythm: normal sinus rhythm  Axis: Normal  Intervals:none  ST&T Change: None  RADIOLOGY CT head reviewed by me with no obvious hemorrhage or midline shift.  PROCEDURES:  Critical Care performed: Yes, see critical care procedure note(s)  .Critical Care Performed by: Blake Divine, MD Authorized by: Blake Divine, MD   Critical care provider statement:    Critical care time (minutes):  45   Critical care time was exclusive of:  Separately billable procedures and treating other patients and teaching time   Critical care was necessary to treat or prevent imminent or life-threatening deterioration of the following conditions:  CNS failure or compromise and circulatory failure   Critical care was time spent personally by me on the following activities:  Development of treatment plan with patient or surrogate, discussions with consultants, evaluation of patient's response to treatment, examination of patient, ordering and review of laboratory studies, ordering and review of radiographic studies, ordering and performing treatments and interventions, pulse oximetry, re-evaluation of patient's condition and review of old charts   I assumed direction of critical care for this patient from another provider in my specialty: no     Care discussed with: admitting provider     MEDICATIONS ORDERED IN ED: Medications  heparin ADULT infusion 100 units/mL (25000 units/257mL) (1,500 Units/hr Intravenous New Bag/Given 11/19/2021 1508)     IMPRESSION / MDM / [place] / ED COURSE  I reviewed the triage vital signs and the nursing notes.                              36 y.o. female with past medical history of hypertension, CHF, PAD, anemia, asthma, and ESRD on PD who presents to the ED with acute onset slurred speech and possible left-sided deficits, code stroke activated  prior to arrival.  Differential diagnosis includes, but is not limited to, stroke, intracranial hemorrhage, electrolyte abnormality, uremia, sepsis.  Patient is chronically ill-appearing but in no acute distress with reassuring vital signs.  She was taken immediately to CT scanner on arrival with CT negative for acute process.  Given unclear picture, decision was made by neurology to hold off on TNK and to proceed directly to MRI  as CTA is contraindicated in her peritoneal dialysis state.  MRI is concerning for numerous small strokes consistent with showering from other source.  Patient additionally appears to have developing ischemia to her right lower extremity, currently denies pain but has mottled appearance of toes.  In further discussion with Dr. Rory Percy of neurology and Dr. [name] of vascular surgery, we will start on heparin drip without bolus according to stroke protocol.  Vascular surgery to evaluate patient later today.  Labs are remarkable for chronically elevated BUN and creatinine consistent with patient's known ESRD, patient also noted to have leukocytosis but no obvious infectious process at this time.  LFTs are unremarkable.  Case discussed with hospitalist for admission.      FINAL CLINICAL IMPRESSION(S) / ED DIAGNOSES   Final diagnoses:  Cerebrovascular 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 ([place])  PVD (peripheral vascular disease) ([place])  Dysarthria     Rx / DC Orders   ED Discharge Orders     None        Note:  This document was prepared using Dragon voice recognition software and may include unintentional dictation errors.   Blake Divine, MD 12/03/2021 [id number]

## 2021-11-26 NOTE — Progress Notes (Signed)
Chaplain Maggie introduced spiritual care at bedside in response to code stroke. Patient and her mother were present for entire visit. Continued care available as needed per on call chaplain.

## 2021-11-26 NOTE — Progress Notes (Signed)
[facility]  ROUNDING NOTE   Subjective:   [name] was admitted to [facility] on 11/18/2021 for Acute CVA (cerebrovascular accident) [facility]) [I63.9]  Patient presented today for left foot amputation but before procedure patient started having difficulty with speaking, and having confusion. MRI showed acute ischemic stroke.   Last peritoneal dialysis treatment was last night  History taken with assistance of mother who is at bedside.    Objective:  Vital signs in last 24 hours:  Temp:  [98.5 F (36.9 C)-98.8 F (37.1 C)] 98.5 F (36.9 C) (02/10 1514) Pulse Rate:  [88-92] 92 (02/10 1514) Resp:  [17-21] 17 (02/10 1514) BP: (85-95)/(74-75) 95/74 (02/10 1514) SpO2:  [88 %-95 %] 95 % (02/10 1514)  Weight change:  There were no vitals filed for this visit.  Intake/Output: I/O last 3 completed shifts: In: 3.3 [IV Piggyback:3.3] Out: -    Intake/Output this shift:  No intake/output data recorded.  Physical Exam: General: NAD,   Head: Normocephalic, atraumatic. Moist oral mucosal membranes  Eyes: Anicteric, PERRL  Neck: Supple, trachea midline  Lungs:  Clear to auscultation  Heart: Regular rate and rhythm  Abdomen:  Soft, nontender,   Extremities:  no peripheral edema.  Neurologic: +expressive aphasia  Skin: No lesions  Access: PD catheter    Basic Metabolic Panel: Recent Labs  Lab 11/24/21 1005 12/03/2021 1412  NA 136 140  K 3.1* 3.8  CL 94* 94*  CO2 19* 20*  GLUCOSE 110* 122*  BUN 69* 65*  CREATININE 12.79* 11.34*  CALCIUM 7.8* 8.3*    Liver Function Tests: Recent Labs  Lab 11/22/2021 1412  AST 11*  ALT 7  ALKPHOS 131*  BILITOT 1.0  PROT 7.1  ALBUMIN 2.8*   No results for input(s): LIPASE, AMYLASE in the last 168 hours. No results for input(s): AMMONIA in the last 168 hours.  CBC: Recent Labs  Lab 11/24/21 1005 11/21/2021 1320 11/21/2021 1412  WBC 16.9* 17.2* 16.6*  NEUTROABS  --  14.0* 14.0*  HGB 9.1* 9.2* 9.4*  HCT 28.0*  28.4* 29.6*  MCV 95.6 99.6 98.7  PLT 253 280 247    Cardiac Enzymes: No results for input(s): CKTOTAL, CKMB, CKMBINDEX, TROPONINI in the last 168 hours.  BNP: Invalid input(s): POCBNP  CBG: Recent Labs  Lab 11/29/2021 1320  GLUCAP 115*    Microbiology: Results for orders placed or performed during the hospital encounter of 12/10/2021  Resp Panel by RT-PCR (Flu A&B, Covid) Nasopharyngeal Swab     Status: Abnormal   Collection Time: 12/07/2021  2:12 PM   Specimen: Nasopharyngeal Swab; Nasopharyngeal(NP) swabs in vial transport medium  Result Value Ref Range Status   SARS Coronavirus 2 by RT PCR POSITIVE (A) NEGATIVE Final    Comment: (NOTE) SARS-CoV-2 target nucleic acids are DETECTED.  The SARS-CoV-2 RNA is generally detectable in upper respiratory specimens during the acute phase of infection. Positive results are indicative of the presence of the identified virus, but do not rule out bacterial infection or co-infection with other pathogens not detected by the test. Clinical correlation with patient history and other diagnostic information is necessary to determine patient infection status. The expected result is Negative.  Fact Sheet for Patients: EntrepreneurPulse.com.au  Fact Sheet for Healthcare Providers: IncredibleEmployment.be  This test is not yet approved or cleared by the [place] FDA and  has been authorized for detection and/or diagnosis of SARS-CoV-2 by FDA under an Emergency Use Authorization (EUA).  This EUA will remain in effect (meaning  this test can be used) for the duration of  the COVID-19 declaration under Section 564(b)(1) of the A ct, 21 U.S.C. section 360bbb-3(b)(1), unless the authorization is terminated or revoked sooner.     Influenza A by PCR NEGATIVE NEGATIVE Final   Influenza B by PCR NEGATIVE NEGATIVE Final    Comment: (NOTE) The Xpert Xpress SARS-CoV-2/FLU/RSV plus assay is intended as an aid in  the diagnosis of influenza from Nasopharyngeal swab specimens and should not be used as a sole basis for treatment. Nasal washings and aspirates are unacceptable for Xpert Xpress SARS-CoV-2/FLU/RSV testing.  Fact Sheet for Patients: EntrepreneurPulse.com.au  Fact Sheet for Healthcare Providers: IncredibleEmployment.be  This test is not yet approved or cleared by the [place] FDA and has been authorized for detection and/or diagnosis of SARS-CoV-2 by FDA under an Emergency Use Authorization (EUA). This EUA will remain in effect (meaning this test can be used) for the duration of the COVID-19 declaration under Section 564(b)(1) of the Act, 21 U.S.C. section 360bbb-3(b)(1), unless the authorization is terminated or revoked.  Performed at [facility], [address] [name], [place] [id number]     Coagulation Studies: No results for input(s): LABPROT, INR in the last 72 hours.  Urinalysis: No results for input(s): COLORURINE, LABSPEC, PHURINE, GLUCOSEU, HGBUR, BILIRUBINUR, KETONESUR, PROTEINUR, UROBILINOGEN, NITRITE, LEUKOCYTESUR in the last 72 hours.  Invalid input(s): APPERANCEUR    Imaging: MR BRAIN WO CONTRAST  Result Date: 11/25/2021 CLINICAL DATA:  Provided history: Neuro deficit, acute, stroke suspected. EXAM: MRI HEAD WITHOUT CONTRAST TECHNIQUE: Multiplanar, multiecho pulse sequences of the brain and surrounding structures were obtained without intravenous contrast. COMPARISON:  Noncontrast head CT performed immediately prior 11/30/2021. FINDINGS: Brain: An abbreviated protocol examination was performed at the provider's request. The following sequences were acquired: Axial and coronal diffusion-weighted imaging, axial T2 FLAIR sequence and axial SWI sequence. The axial T2 FLAIR sequence is moderately motion degraded, limiting evaluation. Subcentimeter acute cortically-based infarct within the posterior left frontal lobe  (series 5, image 35). Subcentimeter acute cortically-based infarct within the lateral right frontal lobe (at the anterior aspect of the right sylvian fissure)(series 5, image 28). Patchy small acute infarcts within the callosal splenium on the right. 12 mm focus of diffusion-weighted hyperintensity within the mid left frontal lobe white matter (series 5, image 32). No definite ADC correlate is appreciated. However, this finding is present on both the axial and coronal diffusion-weighted sequences, and this may reflect a recent infarct. Similarly, there is an ill-defined focus of diffusion-weighted hyperintensity within the left frontoparietal periventricular white matter measuring 10 mm (series 5, image 31). No definite ADC correlate is appreciated. However, this finding is present on both the axial and coronal diffusion-weighted sequences, and this may reflect a recent infarct. Small chronic cortical/subcortical infarct within the right parietooccipital lobes. Chronic lacunar infarct within the left caudate nucleus with associated chronic hemosiderin deposition at this site. Multifocal T2 FLAIR hyperintense signal abnormality within the cerebral white matter, nonspecific but compatible with chronic small vessel ischemic disease. These signal changes are overall mild, but advanced for age. No evidence of an intracranial mass or extra-axial fluid collection. No midline shift. Vascular: Flow voids poorly assessed in the absence of T2 TSE imaging. Skull and upper cervical spine: 1.9 cm bony protuberance projecting outward from the right frontal calvarium, likely reflecting an osteoma. Sinuses/Orbits: No acute orbital finding. Mild mucosal thickening within the bilateral ethmoid sinuses. Other: Trapped fluid within the left petrous apex. Impression #2 called by telephone at the time  of interpretation on 11/22/2021 at 2:05 pm to provider [facility] , who verbally acknowledged these results. IMPRESSION: 1. Abbreviated  protocol motion degraded examination, as described. 2. Subcentimeter acute cortically-based infarcts within the lateral right frontal lobe and posterior left frontal lobe. Patchy small acute infarcts within the callosal splenium on the right. Additional small recent infarcts (measuring up to 12 mm) are questioned within the left frontoparietal white matter. Given small infarcts involving multiple vascular territories, there is suspicion for an embolic process. 3. Small chronic cortical/subcortical infarct within the right parietooccipital lobes. 4. Chronic hemorrhagic lacunar infarct within the left caudate nucleus. 5. Background chronic small-vessel ischemic changes within the cerebral white matter, mild but advanced for age. 6. 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 7. Mild mucosal thickening within the bilateral ethmoid sinuses. 8. Trapped fluid within the left petrous apex. Electronically Signed   By: Kellie Simmering D.O.   On: 12/02/2021 14:31   DG Chest Port 1 View  Result Date: 11/27/2021 CLINICAL DATA:  Weakness EXAM: PORTABLE CHEST 1 VIEW COMPARISON:  07/07/2021 FINDINGS: Stable cardiomegaly. Mild pulmonary vascular congestion. No overt pulmonary edema. No focal airspace consolidation. No pleural effusion or pneumothorax. IMPRESSION: Cardiomegaly with mild pulmonary vascular congestion. Electronically Signed   By: Davina Poke D.O.   On: 12/02/2021 18:38   DG Foot 2 Views Left  Result Date: 12/14/2021 CLINICAL DATA:  Pain EXAM: LEFT FOOT - 2 VIEW COMPARISON:  09/15/2021 FINDINGS: Postsurgical changes of transmetatarsal amputation of the first through fifth rays. There is new bony irregularity along the resection margins of the third, fourth, and fifth metatarsals. No acute fracture. No dislocation. No deep soft tissue ulceration is evident radiographically. Severe small vessel atherosclerotic calcifications. IMPRESSION: Postsurgical changes of transmetatarsal amputation  of the first through fifth rays. New bony irregularity along the resection margins of the third, fourth, and fifth metatarsals concerning for osteomyelitis. Electronically Signed   By: Davina Poke D.O.   On: 12/02/2021 18:37   CT HEAD CODE STROKE WO CONTRAST  Result Date: 11/17/2021 CLINICAL DATA:  Code stroke. Provided history: Neuro deficit, acute, stroke suspected. Additional history provided: Aphasia, left-sided weakness. Last known normal 11:30 a.m. EXAM: CT HEAD WITHOUT CONTRAST TECHNIQUE: Contiguous axial images were obtained from the base of the skull through the vertex without intravenous contrast. RADIATION DOSE REDUCTION: This exam was performed according to the departmental dose-optimization program which includes automated exposure control, adjustment of the mA and/or kV according to patient size and/or use of iterative reconstruction technique. COMPARISON:  Report from head CT 02/21/2002 (images unavailable). FINDINGS: Brain: Mild generalized cerebral atrophy, advanced for age. Small chronic cortical/subcortical infarct within the right parietooccipital lobes. Patchy and ill-defined hypoattenuation within the cerebral white matter, nonspecific but compatible with chronic small vessel ischemic disease. Findings are overall mild, but advanced for age. Small age-indeterminate lacunar infarct within the anterior limb of right internal capsule (series 3, image 18) (series 5, image 32). 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 Scattered supratentorial calcifications, some many of which appear parenchymal. There is no acute intracranial hemorrhage. No acute demarcated cortical infarct is identified. No extra-axial fluid collection. No evidence of an intracranial mass. No midline shift. Vascular: No hyperdense vessel. Atherosclerotic calcifications. Skull: No calvarial fracture. Bony protuberance projecting outward from the right frontal calvarium, measuring 1.9 x 1.1 cm in transaxial  dimensions, compatible with an osteoma (series 4, image 42). Sinuses/Orbits: Visualized orbits show no acute finding. No significant paranasal sinus disease. ASPECTS [facility] Stroke  Program Early CT Score) - Ganglionic level infarction (caudate, lentiform nuclei, internal capsule, insula, M1-M3 cortex): 6 - Supraganglionic infarction (M4-M6 cortex): 3 Total score (0-10 with 10 being normal): 9 Impressions #1 and #2 were communicated to Dr. Rory Percy at 1:50 pmon 11/25/2021 by text page via the [facility] messaging system. IMPRESSION: 1. No acute intracranial hemorrhage or acute demarcated cortical infarct. 2. Age-indeterminate lacunar infarct within the anterior limb of right internal capsule. 3. Small chronic cortical/subcortical infarct within the right parietooccipital lobes. 4. 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 5. Chronic small-vessel ischemic changes within the cerebral white matter, overall mild but advanced for age. 6. Scattered punctate supratentorial parenchymal calcifications. Findings are nonspecific, but may be seen as sequela of neurocysticercosis. 7. Mild cerebral atrophy, advanced for age. 8. 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 Electronically Signed   By: Kellie Simmering D.O.   On: 12/11/2021 13:51     Medications:    ceFEPime (MAXIPIME) IV Stopped (11/23/2021 1851)   heparin 1,500 Units/hr (12/06/2021 1508)   [START ON 11/27/2021] remdesivir 100 mg in NS 100 mL       stroke: mapping our early stages of recovery book   Does not apply Once   aspirin  300 mg Rectal Daily   Or   aspirin  325 mg Oral Daily   collagenase   Topical Daily   acetaminophen **OR** acetaminophen (TYLENOL) oral liquid 160 mg/5 mL **OR** acetaminophen, hydrALAZINE  Assessment/ Plan:  [name] is a 36 y.o. black female with end stage renal disease on peritoneal dialysis, hypertension, congestive heart failure, diabetes mellitus type II, peripheral vascular disease  who is admitted to [facility] on 11/18/2021 for Acute CVA (cerebrovascular accident) [facility]) [I63.9]  [facility] 132kg CCPD 11.5 hours 5 exchanges 301mL fills and last fill of 2072mL  End Stage Renal Disease: Peritoneal dialysis tonight if patient has appropriate hospital room. Orders prepared. Dextrose 2.5%.   Hypertension: allow for permissive hypertension after ischemic event.   Anemia of kidney disease: Hemoglobin 9.4. Hold off on EPO due to ischemic event.   Secondary Hyperparathyroidism with hyperphosphatemia and calciphylaxis: Labs from 2/7: PTH 1882, calcium 8 and phos 8.1. Get sodium thiosulfate weekly. Continue calcium acetate with meals.   Ischemic stroke: as per neuro  Peripheral vascular disease: as per podiatry and vascular.   COVID-19 infection: placed on isolation and remdesivir. Support care.    LOS: 0 Julius Matus 2/10/20237:25 PM

## 2021-11-26 NOTE — Consult Note (Signed)
Pharmacy Antibiotic Note  [name] is a 36 y.o. female admitted on 12/13/2021 with left foot wound infection. Patient had outpatient left foot debridement scheduled for today which was canceled due to concern for stroke. Pharmacy has been consulted for cefepime dosing. Patient is ESRD on PD.   Plan: Cefepime 1 g IV q24h Monitor clinical picture, F/U C&S, abx deescalation / LOT   Temp (24hrs), Avg:98.7 F (37.1 C), Min:98.5 F (36.9 C), Max:98.8 F (37.1 C)  Recent Labs  Lab 11/24/21 1005 12/10/2021 1320 12/13/2021 1412  WBC 16.9* 17.2* 16.6*  CREATININE 12.79*  --  11.34*    Estimated Creatinine Clearance: 9.7 mL/min (A) (by C-G formula based on SCr of 11.34 mg/dL (H)).    Allergies  Allergen Reactions   Percocet [Oxycodone-Acetaminophen] Itching   Tomato Itching   Gabapentin Other (See Comments)    Extremity tremors    Antimicrobials this admission: 2/10 cefepime >>   Dose adjustments this admission: N/A  Microbiology results: No cultures have bee ordered at this time  Thank you for allowing pharmacy to be a part of this patients care.  Darnelle Bos, PharmD 11/17/2021 6:08 PM

## 2021-11-26 NOTE — ED Notes (Signed)
Pt has no audible pulses with doppler  to right foot ed md aware, pts foot appears discolored

## 2021-11-26 NOTE — Treatment Plan (Signed)
Outpatient left foot debridement canceled today due to new stroke concern, will follow in house as neurologic and RLE vascular work up proceeds, I will notify Dr Amalia Hailey my partner who will follow through the weekend, further surgical plans TBD once she is more stable medically. Her left foot surgery is not emergent. Wound care orders placed  Lanae Crumbly, DPM 11/22/2021

## 2021-11-26 NOTE — Progress Notes (Signed)
CODE STROKE- PHARMACY COMMUNICATION   Time CODE STROKE called/page received:1307  Time response to CODE STROKE was made (in person or via phone): 1308  Time Stroke Kit retrieved from [place] (only if needed):N/A, TNK was not needed  Name of Provider/Nurse contacted: Amie [place]  Past Medical History:  Diagnosis Date   Anemia of chronic disease    Asthma    well controlled-no inhaler   Calciphylaxis 2020   CHF (congestive heart failure) ([place])    CKD (chronic kidney disease), stage III ([place])    Diabetes ([place])    type 2-diet controlled   Dyspnea    Hypertension    Morbid obesity ([place])    Nephrotic syndrome    PAD (peripheral artery disease) ([place])    Sepsis ([place]) 06/2021   Prior to Admission medications   Medication Sig Start Date End Date Taking? Authorizing Provider  amLODipine (NORVASC) 10 MG tablet Take 10 mg by mouth as needed (high bp).    [provider]  aspirin EC 81 MG EC tablet Take 1 tablet (81 mg total) by mouth daily. 02/16/16   Demetrios Loll, MD  atorvastatin (LIPITOR) 20 MG tablet Take 1 tablet (20 mg total) by mouth daily. 11/15/21   Algernon Huxley, MD  calcium acetate (PHOSLO) 667 MG capsule Take 2,001 mg by mouth 3 (three) times daily before meals. 01/22/21   [provider]  carvedilol (COREG) 25 MG tablet Take 25 mg by mouth 2 (two) times daily as needed (high blood pressure).    [provider]  cilostazol (PLETAL) 100 MG tablet Take 1 tablet (100 mg total) by mouth 2 (two) times daily before a meal. Patient taking differently: Take 100 mg by mouth daily. 05/07/21   Kris Hartmann, NP  clopidogrel (PLAVIX) 75 MG tablet Take 1 tablet (75 mg total) by mouth daily. 11/15/21   Algernon Huxley, MD  doxycycline (VIBRA-TABS) 100 MG tablet Take 1 tablet (100 mg total) by mouth 2 (two) times daily. Patient not taking: Reported on 11/09/2021 09/08/21   Criselda Peaches, DPM  HYDROcodone-acetaminophen (NORCO/VICODIN) 5-325 MG tablet Take one to two tablets  every six to eight hours as needed for pain. Patient not taking: Reported on 11/09/2021 09/08/21   Criselda Peaches, DPM  isosorbide mononitrate (IMDUR) 30 MG 24 hr tablet Take 30 mg by mouth 2 (two) times daily as needed (high blood pressure).    [provider]  KLOR-CON M20 20 MEQ tablet Take 20 mEq by mouth daily. 10/20/20   [provider]  lactulose (CHRONULAC) 10 GM/15ML solution Take 30 mLs (20 g total) by mouth 2 (two) times daily as needed for mild constipation. 07/09/21   Danford, Suann Larry, MD  losartan (COZAAR) 100 MG tablet Take 100 mg by mouth daily as needed (high blood pressure).    [provider]  multivitamin (RENA-VIT) TABS tablet Take 1 tablet by mouth at bedtime. Patient not taking: Reported on 11/15/2021 09/18/19   Fritzi Mandes, MD  ondansetron (ZOFRAN) 4 MG tablet Take 1 tablet (4 mg total) by mouth every 6 (six) hours as needed for nausea. Patient not taking: Reported on 11/09/2021 09/08/21   Criselda Peaches, DPM  torsemide (DEMADEX) 100 MG tablet Take 100 mg by mouth every morning. 10/20/20   [provider]  Vitamin D, Ergocalciferol, (DRISDOL) 1.25 MG (50000 UNIT) CAPS capsule Take 50,000 Units by mouth once a week. 05/12/21   [provider]    [name] ,PharmD Clinical  Pharmacist  11/22/2021  2:39 PM

## 2021-11-27 ENCOUNTER — Inpatient Hospital Stay: Payer: Medicare Other

## 2021-11-27 ENCOUNTER — Other Ambulatory Visit: Payer: Self-pay

## 2021-11-27 ENCOUNTER — Inpatient Hospital Stay (HOSPITAL_COMMUNITY)
Admit: 2021-11-27 | Discharge: 2021-11-27 | Disposition: A | Discharge status: Home / Self Care | Payer: Medicare Other | Attending: Internal Medicine | Admitting: Internal Medicine

## 2021-11-27 DIAGNOSIS — U071 COVID-19: Secondary | ICD-10-CM

## 2021-11-27 DIAGNOSIS — G9341 Metabolic encephalopathy: Secondary | ICD-10-CM

## 2021-11-27 DIAGNOSIS — I5032 Chronic diastolic (congestive) heart failure: Secondary | ICD-10-CM | POA: Diagnosis not present

## 2021-11-27 DIAGNOSIS — N186 End stage renal disease: Secondary | ICD-10-CM | POA: Diagnosis not present

## 2021-11-27 DIAGNOSIS — I6389 Other cerebral infarction: Secondary | ICD-10-CM

## 2021-11-27 DIAGNOSIS — D631 Anemia in chronic kidney disease: Secondary | ICD-10-CM | POA: Diagnosis not present

## 2021-11-27 DIAGNOSIS — I639 Cerebral infarction, unspecified: Secondary | ICD-10-CM | POA: Diagnosis not present

## 2021-11-27 DIAGNOSIS — Z992 Dependence on renal dialysis: Secondary | ICD-10-CM | POA: Diagnosis not present

## 2021-11-27 LAB — BLOOD GAS, ARTERIAL
Acid-base deficit: 11.3 mmol/L — ABNORMAL HIGH (ref 0.0–2.0)
Bicarbonate: 13.4 mmol/L — ABNORMAL LOW (ref 20.0–28.0)
FIO2: 0.28
O2 Saturation: 97.6 %
Patient temperature: 37
pCO2 arterial: 26 mmHg — ABNORMAL LOW (ref 32.0–48.0)
pH, Arterial: 7.32 — ABNORMAL LOW (ref 7.350–7.450)
pO2, Arterial: 106 mmHg (ref 83.0–108.0)

## 2021-11-27 LAB — CBC WITH DIFFERENTIAL/PLATELET
Abs Immature Granulocytes: 0.1 10*3/uL — ABNORMAL HIGH (ref 0.00–0.07)
Basophils Absolute: 0 10*3/uL (ref 0.0–0.1)
Basophils Relative: 0 %
Eosinophils Absolute: 0.2 10*3/uL (ref 0.0–0.5)
Eosinophils Relative: 2 %
HCT: 27.4 % — ABNORMAL LOW (ref 36.0–46.0)
Hemoglobin: 8.6 g/dL — ABNORMAL LOW (ref 12.0–15.0)
Immature Granulocytes: 1 %
Lymphocytes Relative: 12 %
Lymphs Abs: 1.8 10*3/uL (ref 0.7–4.0)
MCH: 31.5 pg (ref 26.0–34.0)
MCHC: 31.4 g/dL (ref 30.0–36.0)
MCV: 100.4 fL — ABNORMAL HIGH (ref 80.0–100.0)
Monocytes Absolute: 0.5 10*3/uL (ref 0.1–1.0)
Monocytes Relative: 4 %
Neutro Abs: 11.8 10*3/uL — ABNORMAL HIGH (ref 1.7–7.7)
Neutrophils Relative %: 81 %
Platelets: 242 10*3/uL (ref 150–400)
RBC: 2.73 MIL/uL — ABNORMAL LOW (ref 3.87–5.11)
RDW: 17.9 % — ABNORMAL HIGH (ref 11.5–15.5)
WBC: 14.4 10*3/uL — ABNORMAL HIGH (ref 4.0–10.5)
nRBC: 0.1 % (ref 0.0–0.2)

## 2021-11-27 LAB — ECHOCARDIOGRAM COMPLETE
AR max vel: 2.32 cm2
AV Peak grad: 6 mmHg
Ao pk vel: 1.22 m/s
Area-P 1/2: 1.8 cm2
Height: 66 in
MV M vel: 4.65 m/s
MV Peak grad: 86.5 mmHg
MV VTI: 0.91 cm2
S' Lateral: 3.45 cm
Single Plane A4C EF: 68.6 %
Weight: 4624.37 oz

## 2021-11-27 LAB — COMPREHENSIVE METABOLIC PANEL
ALT: 6 U/L (ref 0–44)
AST: 11 U/L — ABNORMAL LOW (ref 15–41)
Albumin: 2.7 g/dL — ABNORMAL LOW (ref 3.5–5.0)
Alkaline Phosphatase: 124 U/L (ref 38–126)
Anion gap: 27 — ABNORMAL HIGH (ref 5–15)
BUN: 67 mg/dL — ABNORMAL HIGH (ref 6–20)
CO2: 17 mmol/L — ABNORMAL LOW (ref 22–32)
Calcium: 8.2 mg/dL — ABNORMAL LOW (ref 8.9–10.3)
Chloride: 94 mmol/L — ABNORMAL LOW (ref 98–111)
Creatinine, Ser: 12.01 mg/dL — ABNORMAL HIGH (ref 0.44–1.00)
GFR, Estimated: 4 mL/min — ABNORMAL LOW (ref >60)
Glucose, Bld: 98 mg/dL (ref 70–99)
Potassium: 4.1 mmol/L (ref 3.5–5.1)
Sodium: 138 mmol/L (ref 135–145)
Total Bilirubin: 0.7 mg/dL (ref 0.3–1.2)
Total Protein: 6.6 g/dL (ref 6.5–8.1)

## 2021-11-27 LAB — GLUCOSE, CAPILLARY
Glucose-Capillary: 127 mg/dL — ABNORMAL HIGH (ref 70–99)
Glucose-Capillary: 54 mg/dL — ABNORMAL LOW (ref 70–99)
Glucose-Capillary: 75 mg/dL (ref 70–99)
Glucose-Capillary: 78 mg/dL (ref 70–99)
Glucose-Capillary: 81 mg/dL (ref 70–99)
Glucose-Capillary: 85 mg/dL (ref 70–99)
Glucose-Capillary: 96 mg/dL (ref 70–99)

## 2021-11-27 LAB — C-REACTIVE PROTEIN: CRP: 6.7 mg/dL — ABNORMAL HIGH (ref <1.0)

## 2021-11-27 LAB — HEPARIN LEVEL (UNFRACTIONATED)
Heparin Unfractionated: 0.12 IU/mL — ABNORMAL LOW (ref 0.30–0.70)
Heparin Unfractionated: 0.22 IU/mL — ABNORMAL LOW (ref 0.30–0.70)

## 2021-11-27 LAB — D-DIMER, QUANTITATIVE: D-Dimer, Quant: 0.61 ug/mL-FEU — ABNORMAL HIGH (ref 0.00–0.50)

## 2021-11-27 LAB — MRSA NEXT GEN BY PCR, NASAL: MRSA by PCR Next Gen: NOT DETECTED

## 2021-11-27 IMAGING — DX DG CHEST 1V PORT
1 series · 1 of 1 positions shown · non-contrast
Comparison: [DATE]

CLINICAL DATA: Hypoxia.

EXAM:
PORTABLE CHEST 1 VIEW

[chest ap]
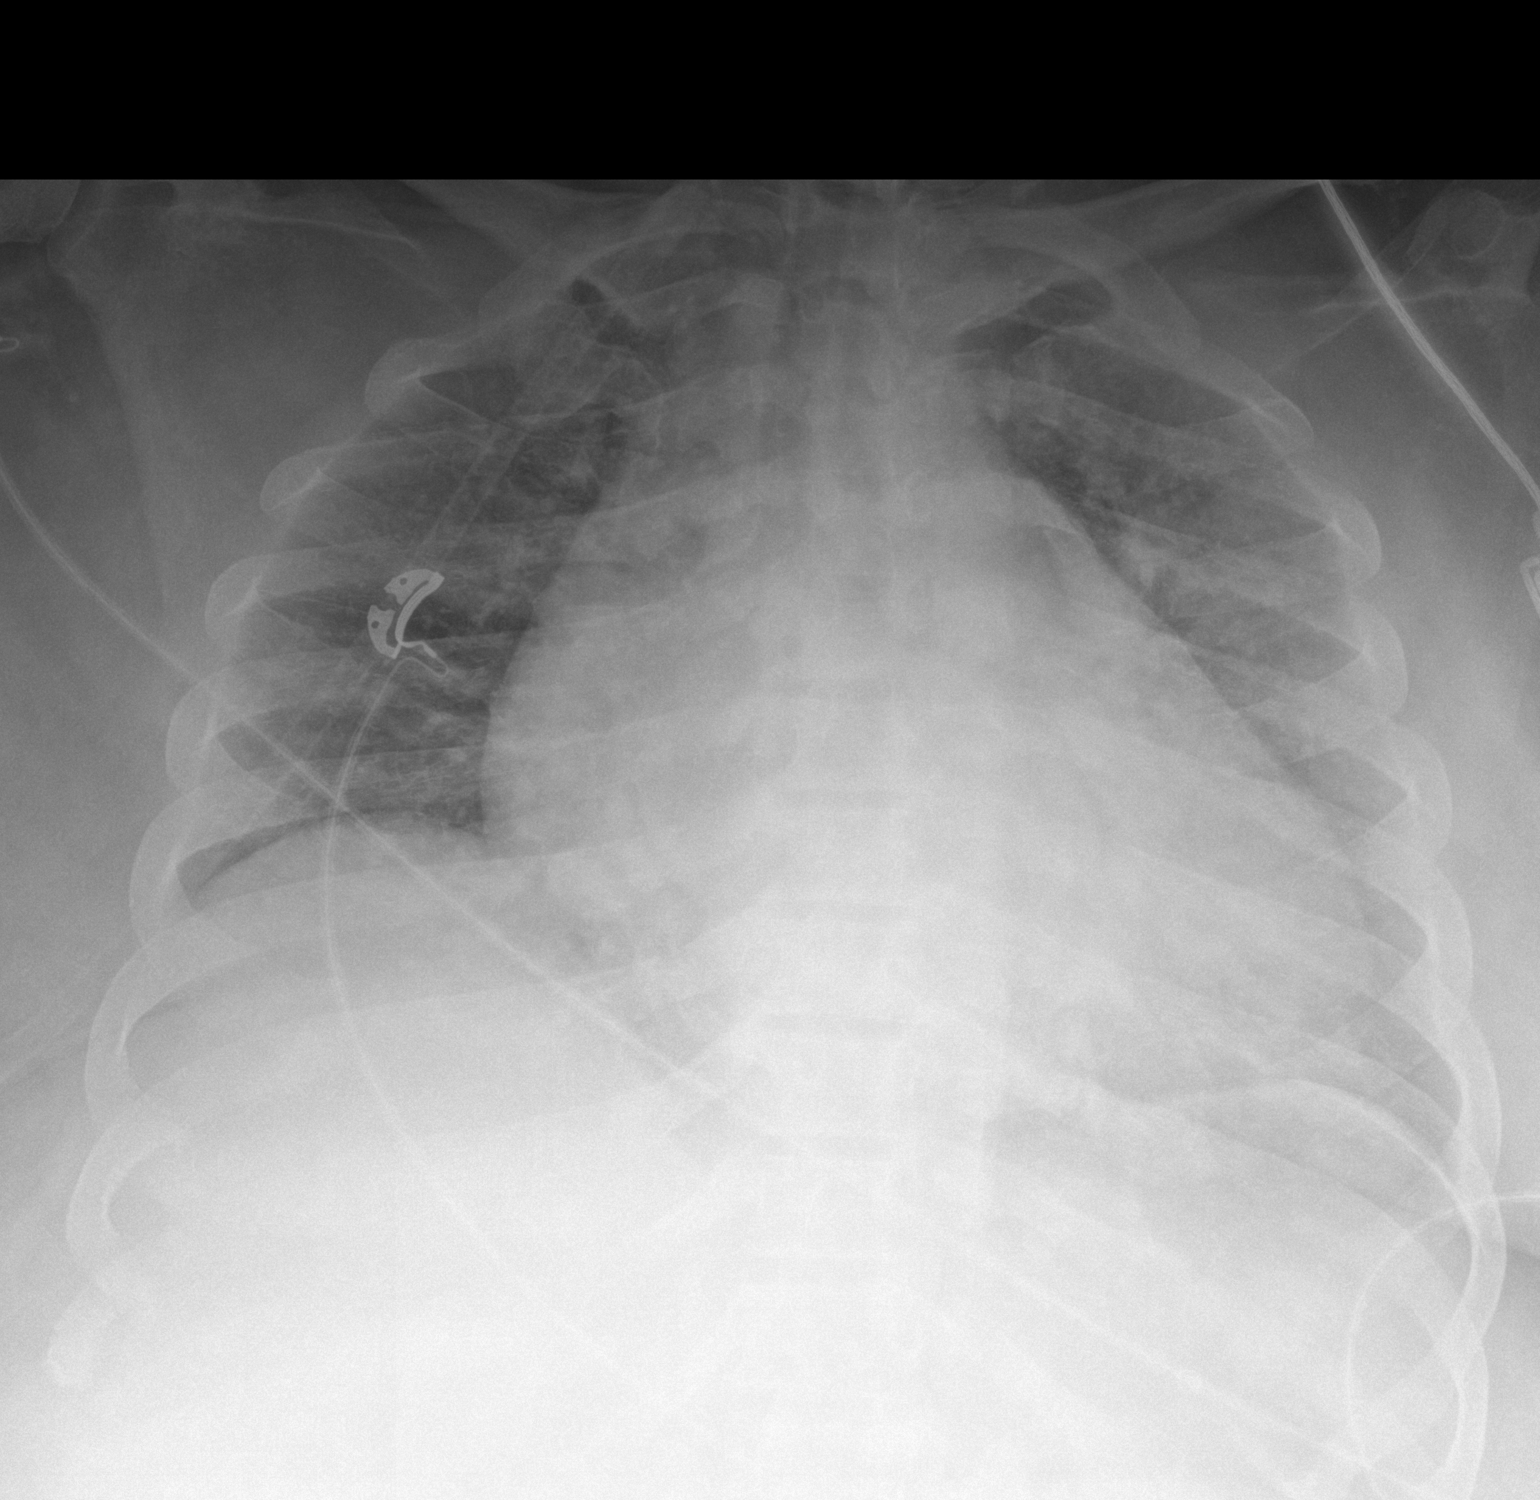

[1 of 1 positions shown; findings below may reference images not displayed]

FINDINGS: Cardiac silhouette mildly enlarged.  No mediastinal or hilar masses.

Subtle hazy airspace opacity suggested in the left mid to upper
lung. Remainder of the lungs is clear.

No convincing pleural effusion or pneumothorax.

Skeletal structures are grossly intact.
IMPRESSION: 1. Subtle hazy airspace adjusted in the left mid to upper lung,
which appears to be a change from the previous day's exam. This is
consistent with pneumonia.

## 2021-11-27 IMAGING — CT CT HEAD W/O CM
4 series · 16 of 47 positions shown, 18 images · non-contrast
Comparison: [DATE].

CLINICAL DATA: Right-sided weakness.  Aphasia.



[Series 2: head wo · axial · 0.47mm/px · z∈[-154,-39]mm · 7 of 31 slices shown, 9 images]
[im 4/31  brain]
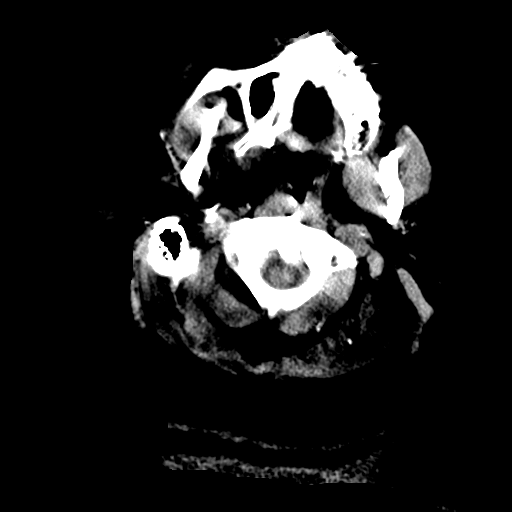
[im 4/31  bone]
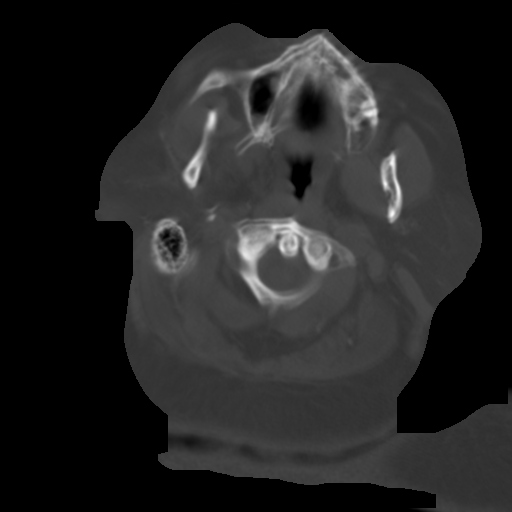
[im 8/31  brain]
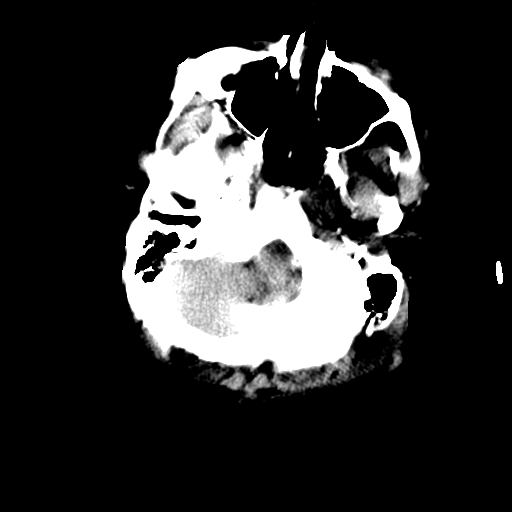
[im 12/31  brain]
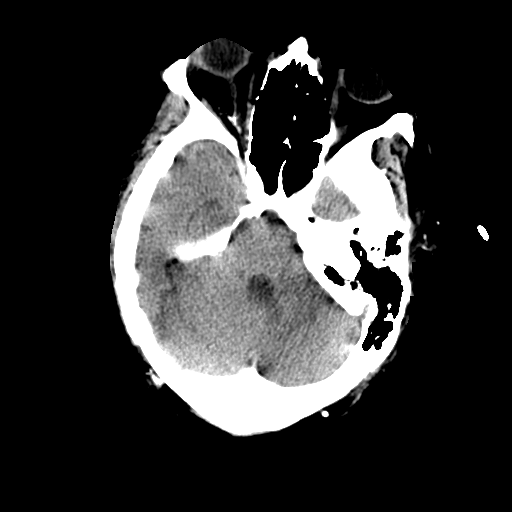
[im 16/31  brain]
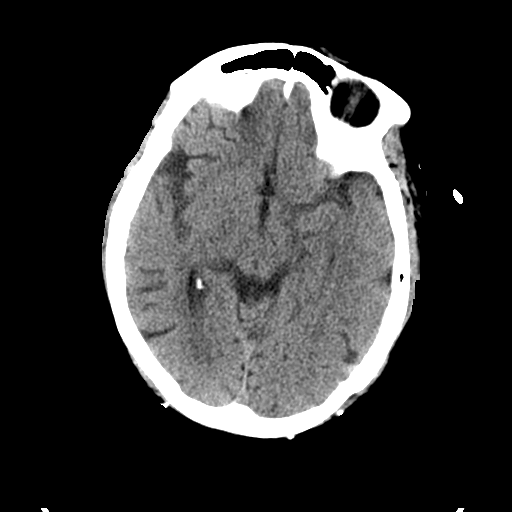
[im 19/31  brain]
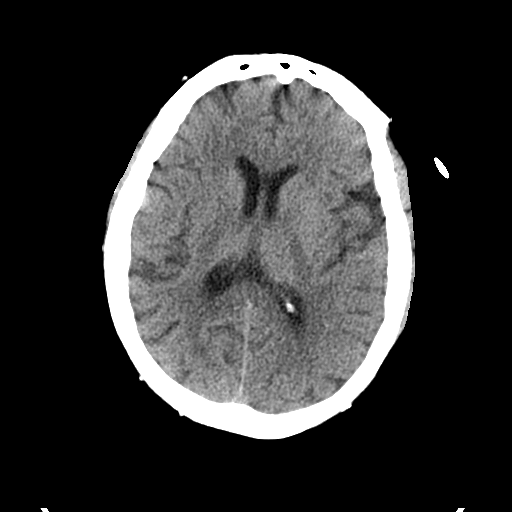
[im 19/31  bone]
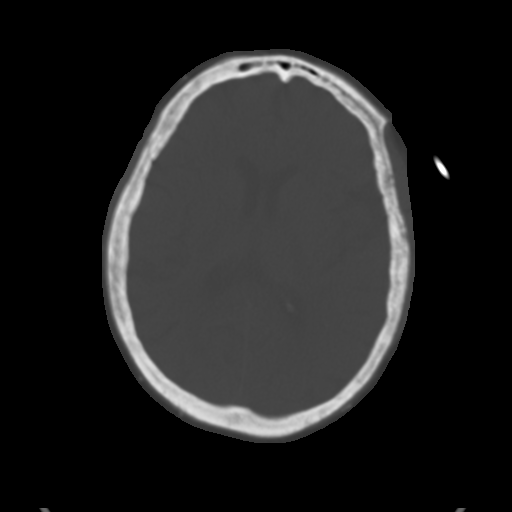
[im 23/31  brain]
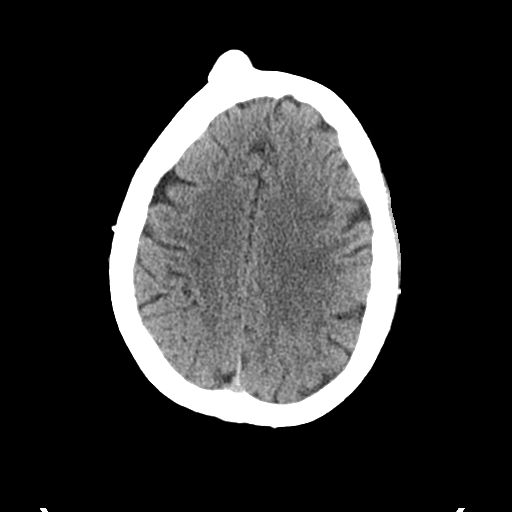
[im 27/31  brain]
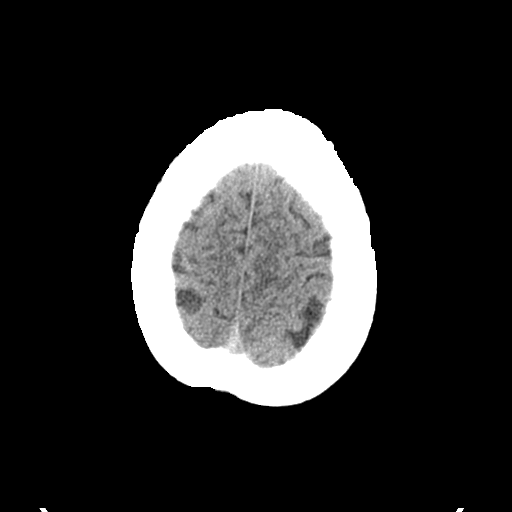

[Series 3: head bone · axial · 0.47mm/px · z∈[-155,-123]mm · 3 of 78 slices shown]
[im 8/78  bone]
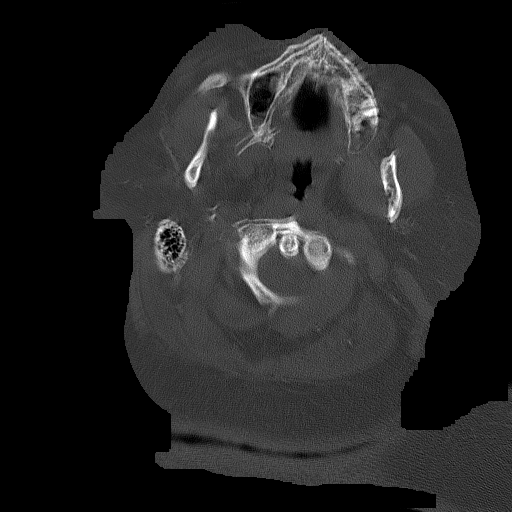
[im 16/78  bone]
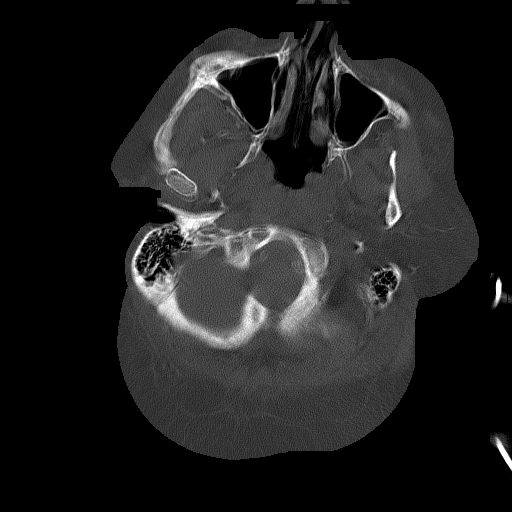
[im 24/78  bone]
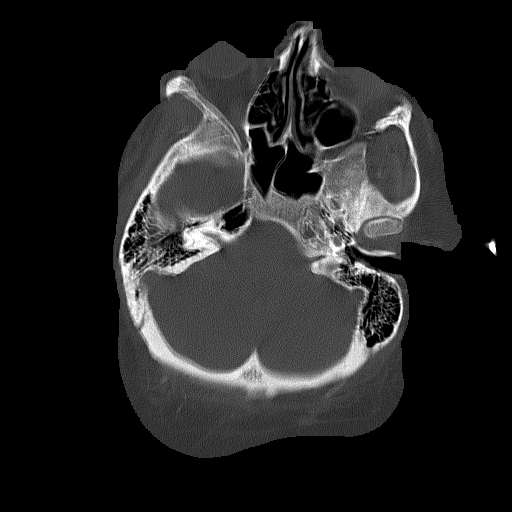

[Series 4: coronal soft tissue · coronal · 0.31mm/px · 3 of 87 slices shown]
[im 29/87  brain]
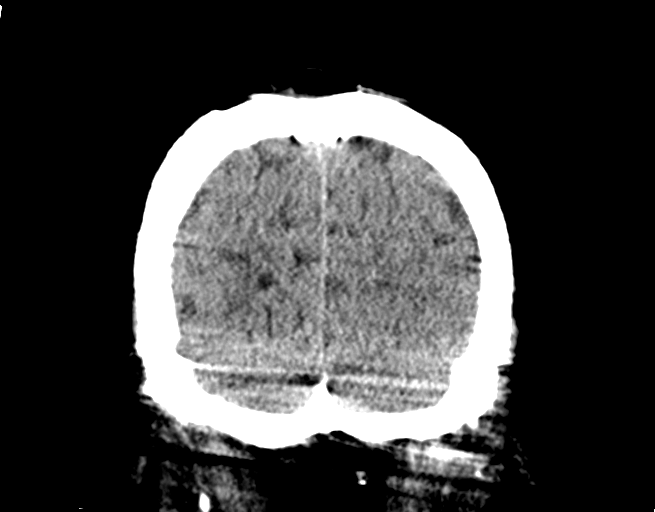
[im 39/87  brain]
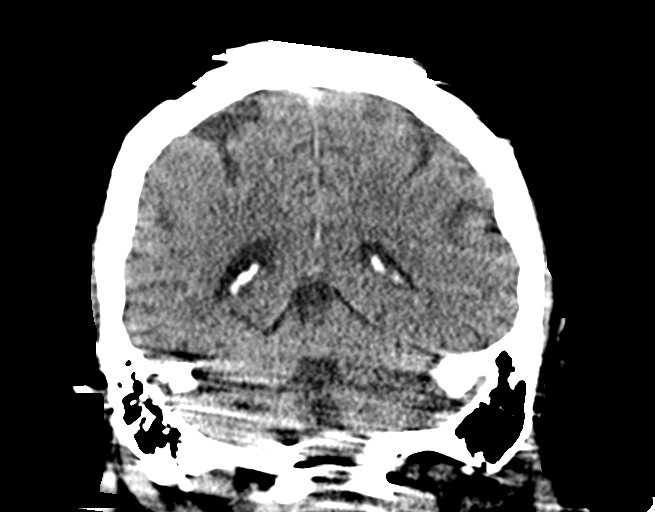
[im 48/87  brain]
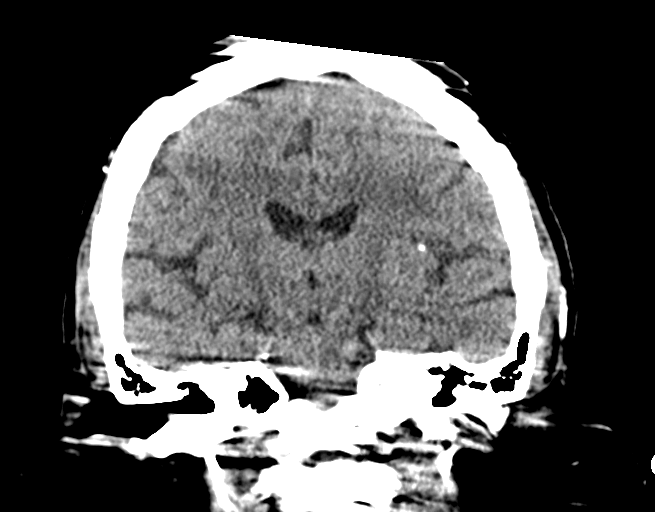

[Series 5: sagittal soft tissue · sagittal · 0.34mm/px · 3 of 68 slices shown]
[im 24/68  brain]
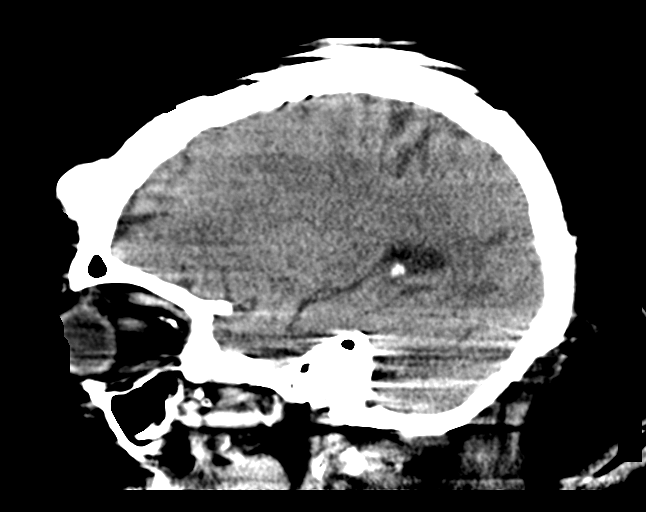
[im 34/68  brain]
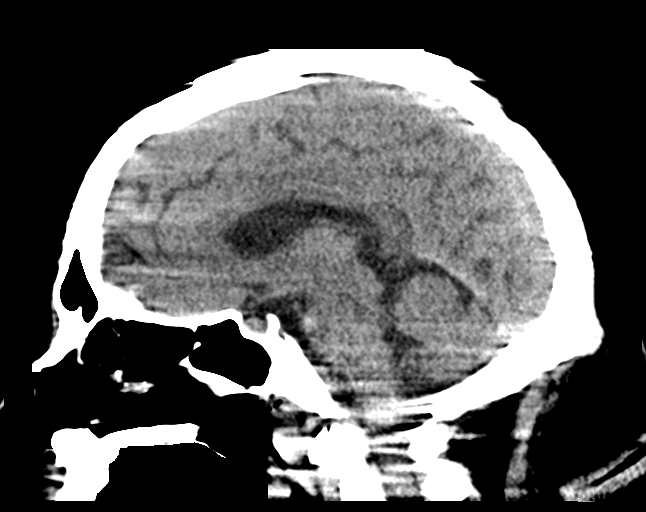
[im 44/68  brain]
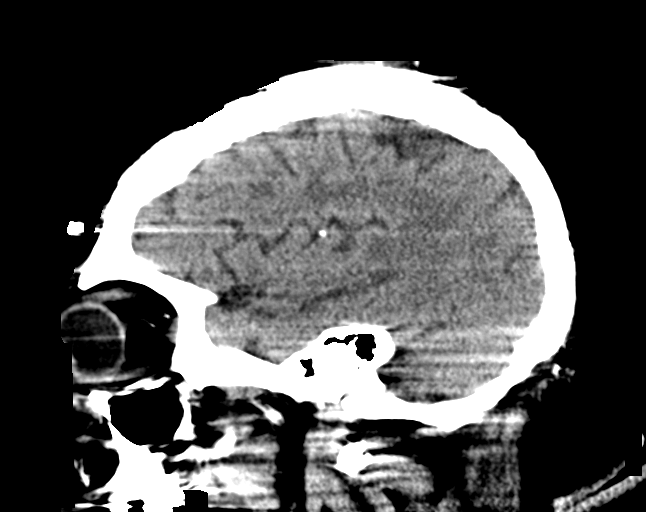

[16 of 47 positions shown; findings below may reference images not displayed]

FINDINGS: Brain: No evidence of acute infarction, hemorrhage, hydrocephalus,
extra-axial collection or mass lesion/mass effect.

Small infarcts described on the previous day's exam less
well-defined due to patient motion on the current study.

Vascular: No hyperdense vessel or unexpected calcification.

Skull: No fracture. No discrete bone lesion. Large right frontal
osteoma, stable.

Sinuses/Orbits: Globes and orbits are unremarkable. Sinuses are
clear.

Other: None.
IMPRESSION: 1. No acute intracranial abnormalities. No change from the previous
day's study.

## 2021-11-27 MED ORDER — DEXTROSE 50 % IV SOLN: 25.0000 mL | Freq: Once | INTRAVENOUS | Status: AC

## 2021-11-27 MED ORDER — DEXTROSE 50 % IV SOLN
INTRAVENOUS | Status: AC
  Administered 2021-11-27: 25 mL via INTRAVENOUS
  Filled 2021-11-27: qty 50

## 2021-11-27 MED ORDER — ORAL CARE MOUTH RINSE
15.0000 mL | Freq: Two times a day (BID) | OROMUCOSAL | Status: DC
  Administered 2021-11-28 – 2021-11-29 (×4): 15 mL via OROMUCOSAL

## 2021-11-27 MED ORDER — FENTANYL CITRATE PF 50 MCG/ML IJ SOSY
12.5000 ug | PREFILLED_SYRINGE | INTRAMUSCULAR | Status: DC | PRN
  Administered 2021-11-28 – 2021-11-29 (×6): 12.5 ug via INTRAVENOUS
  Filled 2021-11-27 (×7): qty 1

## 2021-11-27 MED ORDER — HYDROMORPHONE HCL 1 MG/ML IJ SOLN
0.4000 mg | INTRAMUSCULAR | Status: DC | PRN
  Administered 2021-11-27: 0.4 mg via INTRAVENOUS
  Filled 2021-11-27: qty 1

## 2021-11-27 MED ORDER — CHLORHEXIDINE GLUCONATE CLOTH 2 % EX PADS
6.0000 | MEDICATED_PAD | Freq: Every day | CUTANEOUS | Status: DC
  Administered 2021-11-27 – 2021-12-01 (×5): 6 via TOPICAL

## 2021-11-27 MED ORDER — VANCOMYCIN HCL 2000 MG/400ML IV SOLN
2000.0000 mg | Freq: Once | INTRAVENOUS | Status: AC
  Administered 2021-11-27: 2000 mg via INTRAVENOUS
  Filled 2021-11-27: qty 400

## 2021-11-27 MED ORDER — GENTAMICIN SULFATE 0.1 % EX CREA
1.0000 "application " | TOPICAL_CREAM | Freq: Every day | CUTANEOUS | Status: DC
  Administered 2021-11-27 – 2021-12-04 (×7): 1 via TOPICAL
  Filled 2021-11-27: qty 15

## 2021-11-27 MED ORDER — DELFLEX-LC/1.5% DEXTROSE 344 MOSM/L IP SOLN
INTRAPERITONEAL | Status: DC
  Administered 2021-11-30: 6000 mL via INTRAPERITONEAL
  Filled 2021-11-27 (×6): qty 3000

## 2021-11-27 MED ORDER — CHLORHEXIDINE GLUCONATE 0.12 % MT SOLN
15.0000 mL | Freq: Two times a day (BID) | OROMUCOSAL | Status: DC
  Administered 2021-11-28 – 2021-11-29 (×4): 15 mL via OROMUCOSAL
  Filled 2021-11-27 (×2): qty 15

## 2021-11-27 MED ORDER — DEXTROSE 10 % IV SOLN: INTRAVENOUS | Status: DC

## 2021-11-27 NOTE — Progress Notes (Addendum)
Progress Note   Patient: [name] RFF:[id number] DOB: 1986-10-13 DOA: 11/18/2021     1 DOS: the patient was seen and examined on 11/27/2021       Brief hospital course: Mrs. Disla is a 36 y.o. 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  Evidently, she was found by family with left arm drift and nonverbal.  Arrived to ER as CODE STROKE, evaluated by Neurology and found to have established stroke, tPA withheld.  Also noted to have decreased pulse in right foot, as well as tingling.  Vascular surgery consulted, felt this was ischemic, started heparin.  Also incidentally COVID+       Assessment and Plan: * Acute CVA (cerebrovascular accident) ([place])- (present on admission) -Non-invasive angiography with MRI head pending -Echocardiogram pending -Carotid imaging pending -Lipids ordered: LDL 76 continue atorvastatin unable to take p.o. -Aspirin suppository ordered -Atrial fibrillation: Not present on imaging -tPA not given because strokes of different age -Dysphagia screen failed in the ER -PT eval ordered    Acute metabolic encephalopathy This morning was in severe pain.  Given low dose hydromorphone, afterwards slept, but awoke around 1p more confused than previous.  Now with more right sided weakness than prior. - Stat CT head ordered   Ischemic foot- (present on admission) Pulse diminished in foot.  Suspect central embolism.    Stroke precludes operative management of thrombosis in leg - Consult Vascular surgery - Continue heparin gtt  COVID-19 virus infection This was an incidental finding.  No significant airways disease -Continue remdesivir, heparin  ESRD (end stage renal disease) on  dialysis [facility]) - COnsult Nephrology for peritoneal HD  Atherosclerosis of native arteries of the extremities with ulceration ([place])- (present on admission) -Continue heparin - COntinue aspirin suppository - Resume statin when able to take PO  Essential hypertension- (present on admission) BP low here, but accurate management has been difficult. -Hold amlodipine, losartan, Coreg  Obesity, Class III, BMI 40-49.9 (morbid obesity) ([place])- (present on admission) BMI 46  Chronic diastolic CHF (congestive heart failure) ([facility])- (present on admission) - Fluid balance through HD  Iron deficiency anemia- (present on admission) Hgb no change from baseline  Anemia in chronic kidney disease- (present on admission) Stable relative to baseline, 8.6   ADDENDUM:  Acute respiratory failure with hypoxia While in CT, patient with new desaturation to 70s.   - Rapid response called - Consult ICU        Subjective: Patient non-verbal/aphasic.  Per nursing, appears to have severe pain in left foot.  BP reading inaccurate.  No fever.  No respiratory distress, no vomiting.  Sleepy/sluggish since dilaudid.      Physical Exam: Vitals:   11/27/21 1100 11/27/21 1159 11/27/21 1329 11/27/21 1341  BP: (!) 81/45 (!) 85/57 113/86 (!) 107/94  Pulse:      Resp:      Temp: 97.6 F (36.4 C)     TempSrc: Axillary     SpO2:      Weight:      Height:       Obese adult female, appears chronically ill, older than stated age, lying in bed, very sluggish. Seems to have flaccid right arm, 2/5, cannot lift above gravity, same with right leg.  The  left leg she only intermittently follows commands, but this seems to have antigravity strength.  She seems globally encephalopathic. Heart rate regular, no murmurs, heart sounds distant, JVP not visible due to body habitus, no peripheral edema Lung sounds diminished due to body habitus, not appreciate rales or wheezes No pain or rigidity to palpation of the  abdomen, no grimace.       Data Reviewed: Discussed with neurology, vascular surgery, nephrology Labs and imaging studies are notable for normal glucose Hemogram notable for macrocytic anemia, hemoglobin stable at 8.6, white blood cell count up to 820 MRI brain notable for scattered cortical based infarcts, around 1 cm. Chest x-ray with congestion, no focal airspace disease or overt edema Foot radiograph with some bony irregularity, consistent with osteomyelitis  Family Communication: Caqlled to mother, no answer, called sister, updated.  Disposition: Status is: Inpatient Remains inpatient appropriate because: She requires ongoing IV heparin, IV antibiotics, and work-up for stroke          Planned Discharge Destination: Skilled nursing facility     The patient is critically ill with multi-organ failure.  Critical care was necessary to treat or prevent imminent or life-threatening deterioration of sepsis, respiratory failure, cardiac failure, and was exclusive of separately billable procedures and treating other patients. Total critical care time spent by me: 65 minutes Time spent personally by me on obtaining history from patient or surrogate, evaluation of the patient, evaluation of patient's response to treatment, ordering and review of laboratory studies, ordering and review of radiographic studies, ordering and performing treatments and interventions, and re-evaluation of the patient's condition.   Author: Edwin Dada, MD 11/27/2021 2:26 PM  For on call review [email or web address].

## 2021-11-27 NOTE — Assessment & Plan Note (Addendum)
Family describe recent baseline was that patient was weaker, more often in Wheelchair, speaking less.  Acutely worse now, aphasic with waxing and waning agitation and difficulty to redirect and neglect

## 2021-11-27 NOTE — Hospital Course (Addendum)
[name] is a 36 y.o. F with ESRD on peritoneal dialysis, hx calciphylaxis and chronic ischemic ulceration of bilateral LEs with recent PCI of left tibial artery and thrombectomy of left SFA and stent to right iliacs, PCOS, obesity BMI 46, asthma, HTN, and dCHF who presented with initial report of acute speech difficulty and acute on chronic L weakness.  Evidently, family report she has really been subacutely worsening, over weeks, more often in wheelchair, less verbal, but on the day of admission, she was supposed to have a podiatry procedure by Dr. Sherryle Lis, when family felt she was acutely worse (left arm drift and slurred or absent speech).  Arrived to ER as CODE STROKE, evaluated by Neurology and found to have established stroke, tPA withheld.  Also noted to have decreased pulse in right foot, as well as tingling.  Vascular surgery consulted, felt this was ischemic, started heparin, antibiotics.  Also incidentally COVID+    2/11: Persistently encephalopathy, concern for worsening R hemiparesis --> repeat CT head unchanged --> hypoxic event during CT head --> transfer to [facility] 2/12: Arm BP readings erratic and unreliable --> Art line placed; echo showing MV thickening

## 2021-11-27 NOTE — Progress Notes (Signed)
Report called to [facility]. Patient transferred via bed to ccu room 5 on o2 at 2L. Patient more awake, some distress noted by patient moaning and grimacing.  family in waiting room.

## 2021-11-27 NOTE — Consult Note (Signed)
[place] for heparin Indication:  Thrombus  Allergies  Allergen Reactions   Percocet [Oxycodone-Acetaminophen] Itching   Tomato Itching   Gabapentin Other (See Comments)    Extremity tremors    Patient Measurements: Height: 5\' 6"  (167.6 cm) Weight: 133.4 kg (294 lb 1.5 oz) IBW/kg (Calculated) : 59.3 Heparin Dosing Weight: 92.3 Kg  Vital Signs: Temp: 94.2 F (34.6 C) (02/11 2000) Temp Source: Axillary (02/11 2000) BP: 79/60 (02/11 2000) Pulse Rate: 92 (02/11 2000)  Labs: Recent Labs    11/19/2021 1320 11/22/2021 1412 11/17/2021 2222 11/27/21 0528 11/27/21 0906 11/27/21 2048  HGB 9.2* 9.4*  --  8.6*  --   --   HCT 28.4* 29.6*  --  27.4*  --   --   PLT 280 247  --  242  --   --   APTT  --   --  38*  --   --   --   LABPROT  --   --  16.3*  --   --   --   INR  --   --  1.3*  --   --   --   HEPARINUNFRC  --   --  <0.10*  --  0.12* 0.22*  CREATININE  --  11.34*  --  12.01*  --   --      Estimated Creatinine Clearance: 9.1 mL/min (A) (by C-G formula based on SCr of 12.01 mg/dL (H)).   Medical History: Past Medical History:  Diagnosis Date   Anemia of chronic disease    Asthma    well controlled-no inhaler   Calciphylaxis 2020   CHF (congestive heart failure) ([place])    CKD (chronic kidney disease), stage III ([place])    Diabetes ([place])    type 2-diet controlled   Dyspnea    Hypertension    Morbid obesity ([place])    Nephrotic syndrome    PAD (peripheral artery disease) ([place])    Sepsis ([place]) 06/2021    Medications:  No chronic anticoagulation PTA  Assessment: Pharmacy has been consulted to initiate Heparin in 35yo patient admitted with stroke-like symptoms. Her symptoms began to resolve, but neurologist noted concern for lower extremity ischemia, likely embolic.   Goal of Therapy:  Heparin level 0.3-0.5 units/ml(Per Stroke Protocol) Monitor platelets by anticoagulation protocol: Yes  2/10 2222 HL < 0.10, subtherapeutic 2/11  0906 HL 0.12, subtherapeutic 2/11 2048 HL 0.22, subtherapeutic    Plan:  Per Neurologist, will avoid bolus x 24 hours Increase heparin infusion to 2100 units/hr Recheck HL in 8 hours after rate change Continue to monitor H&H and platelets  Darnelle Bos, PharmD Clinical Pharmacist 11/27/2021 9:54 PM

## 2021-11-27 NOTE — Progress Notes (Addendum)
[facility]  ROUNDING NOTE   Subjective:   [name] was admitted to [facility] on 11/23/2021 for PVD (peripheral vascular disease) ([place]) [I73.9] Pain [R52] Dysarthria [R47.1] Acute CVA (cerebrovascular accident) ([place]) [I63.9] Cerebrovascular 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 ([place]) [I63.40] COVID-19 [U07.1]  Patient presented today for left foot amputation but before procedure patient started having difficulty with speaking, and having confusion. MRI showed acute ischemic stroke.   Patient resting quietly  No family at bedside Drowsy  Hep drip  D10 IVF @25  ml/hr   Objective:  Vital signs in last 24 hours:  Temp:  [97.6 F (36.4 C)-98.8 F (37.1 C)] 97.6 F (36.4 C) (02/11 0820) Pulse Rate:  [88-109] 107 (02/11 0900) Resp:  [12-38] 19 (02/11 0900) BP: (74-106)/(35-91) 74/40 (02/11 0835) SpO2:  [88 %-100 %] 100 % (02/11 0900) Weight:  [131.1 kg] 131.1 kg (02/11 0100)  Weight change:  Filed Weights   11/27/21 0100  Weight: 131.1 kg    Intake/Output: I/O last 3 completed shifts: In: 133.2 [I.V.:129.9; IV Piggyback:3.3] Out: 0    Intake/Output this shift:  No intake/output data recorded.  Physical Exam: General: NAD  Head: Normocephalic, atraumatic. Moist oral mucosal membranes  Neck: Supple, trachea midline  Lungs:  Clear to auscultation,normal effort  Heart: Regular rate and rhythm  Abdomen:  Soft, nontender, PD catheter  Extremities:  no peripheral edema.  Neurologic: +expressive aphasia  Skin: No lesions  Access: PD catheter    Basic Metabolic Panel: Recent Labs  Lab 11/24/21 1005 12/09/2021 1412 11/27/21 0528  NA 136 140 138  K 3.1* 3.8 4.1  CL 94* 94* 94*  CO2 19* 20* 17*  GLUCOSE 110* 122* 98  BUN 69* 65* 67*  CREATININE 12.79* 11.34* 12.01*  CALCIUM 7.8* 8.3* 8.2*     Liver Function Tests: Recent Labs  Lab 11/30/2021 1412 11/27/21 0528  AST 11* 11*  ALT 7 6  ALKPHOS 131* 124  BILITOT 1.0 0.7  PROT  7.1 6.6  ALBUMIN 2.8* 2.7*    No results for input(s): LIPASE, AMYLASE in the last 168 hours. No results for input(s): AMMONIA in the last 168 hours.  CBC: Recent Labs  Lab 11/24/21 1005 12/03/2021 1320 11/25/2021 1412 11/27/21 0528  WBC 16.9* 17.2* 16.6* 14.4*  NEUTROABS  --  14.0* 14.0* 11.8*  HGB 9.1* 9.2* 9.4* 8.6*  HCT 28.0* 28.4* 29.6* 27.4*  MCV 95.6 99.6 98.7 100.4*  PLT 253 280 247 242     Cardiac Enzymes: No results for input(s): CKTOTAL, CKMB, CKMBINDEX, TROPONINI in the last 168 hours.  BNP: Invalid input(s): POCBNP  CBG: Recent Labs  Lab 11/30/2021 2118 11/27/21 0004 11/27/21 0053 11/27/21 0653 11/27/21 0655  GLUCAP 72 54* 127* 96 85     Microbiology: Results for orders placed or performed during the hospital encounter of 11/19/2021  Resp Panel by RT-PCR (Flu A&B, Covid) Nasopharyngeal Swab     Status: Abnormal   Collection Time: 11/25/2021  2:12 PM   Specimen: Nasopharyngeal Swab; Nasopharyngeal(NP) swabs in vial transport medium  Result Value Ref Range Status   SARS Coronavirus 2 by RT PCR POSITIVE (A) NEGATIVE Final    Comment: (NOTE) SARS-CoV-2 target nucleic acids are DETECTED.  The SARS-CoV-2 RNA is generally detectable in upper respiratory specimens during the acute phase of infection. Positive results are indicative of the presence of the identified virus, but do not rule out bacterial infection or co-infection with other pathogens not detected by the test. Clinical correlation  with patient history and other diagnostic information is necessary to determine patient infection status. The expected result is Negative.  Fact Sheet for Patients: EntrepreneurPulse.com.au  Fact Sheet for Healthcare Providers: IncredibleEmployment.be  This test is not yet approved or cleared by the [place] FDA and  has been authorized for detection and/or diagnosis of SARS-CoV-2 by FDA under an Emergency Use Authorization  (EUA).  This EUA will remain in effect (meaning this test can be used) for the duration of  the COVID-19 declaration under Section 564(b)(1) of the A ct, 21 U.S.C. section 360bbb-3(b)(1), unless the authorization is terminated or revoked sooner.     Influenza A by PCR NEGATIVE NEGATIVE Final   Influenza B by PCR NEGATIVE NEGATIVE Final    Comment: (NOTE) The Xpert Xpress SARS-CoV-2/FLU/RSV plus assay is intended as an aid in the diagnosis of influenza from Nasopharyngeal swab specimens and should not be used as a sole basis for treatment. Nasal washings and aspirates are unacceptable for Xpert Xpress SARS-CoV-2/FLU/RSV testing.  Fact Sheet for Patients: EntrepreneurPulse.com.au  Fact Sheet for Healthcare Providers: IncredibleEmployment.be  This test is not yet approved or cleared by the [place] FDA and has been authorized for detection and/or diagnosis of SARS-CoV-2 by FDA under an Emergency Use Authorization (EUA). This EUA will remain in effect (meaning this test can be used) for the duration of the COVID-19 declaration under Section 564(b)(1) of the Act, 21 U.S.C. section 360bbb-3(b)(1), unless the authorization is terminated or revoked.  Performed at [facility], [address] [place], [place] [address]     Coagulation Studies: Recent Labs    11/17/2021 2222  LABPROT 16.3*  INR 1.3*    Urinalysis: No results for input(s): COLORURINE, LABSPEC, PHURINE, GLUCOSEU, HGBUR, BILIRUBINUR, KETONESUR, PROTEINUR, UROBILINOGEN, NITRITE, LEUKOCYTESUR in the last 72 hours.  Invalid input(s): APPERANCEUR    Imaging: MR BRAIN WO CONTRAST  Result Date: 12/13/2021 CLINICAL DATA:  Provided history: Neuro deficit, acute, stroke suspected. EXAM: MRI HEAD WITHOUT CONTRAST TECHNIQUE: Multiplanar, multiecho pulse sequences of the brain and surrounding structures were obtained without intravenous contrast. COMPARISON:  Noncontrast  head CT performed immediately prior 11/18/2021. FINDINGS: Brain: An abbreviated protocol examination was performed at the provider's request. The following sequences were acquired: Axial and coronal diffusion-weighted imaging, axial T2 FLAIR sequence and axial SWI sequence. The axial T2 FLAIR sequence is moderately motion degraded, limiting evaluation. Subcentimeter acute cortically-based infarct within the posterior left frontal lobe (series 5, image 35). Subcentimeter acute cortically-based infarct within the lateral right frontal lobe (at the anterior aspect of the right sylvian fissure)(series 5, image 28). Patchy small acute infarcts within the callosal splenium on the right. 12 mm focus of diffusion-weighted hyperintensity within the mid left frontal lobe white matter (series 5, image 32). No definite ADC correlate is appreciated. However, this finding is present on both the axial and coronal diffusion-weighted sequences, and this may reflect a recent infarct. Similarly, there is an ill-defined focus of diffusion-weighted hyperintensity within the left frontoparietal periventricular white matter measuring 10 mm (series 5, image 31). No definite ADC correlate is appreciated. However, this finding is present on both the axial and coronal diffusion-weighted sequences, and this may reflect a recent infarct. Small chronic cortical/subcortical infarct within the right parietooccipital lobes. Chronic lacunar infarct within the left caudate nucleus with associated chronic hemosiderin deposition at this site. Multifocal T2 FLAIR hyperintense signal abnormality within the cerebral white matter, nonspecific but compatible with chronic small vessel ischemic disease. These signal changes are overall mild, but  advanced for age. No evidence of an intracranial mass or extra-axial fluid collection. No midline shift. Vascular: Flow voids poorly assessed in the absence of T2 TSE imaging. Skull and upper cervical spine: 1.9 cm  bony protuberance projecting outward from the right frontal calvarium, likely reflecting an osteoma. Sinuses/Orbits: No acute orbital finding. Mild mucosal thickening within the bilateral ethmoid sinuses. Other: Trapped fluid within the left petrous apex. Impression #2 called by telephone at the time of interpretation on 11/18/2021 at 2:05 pm to provider [facility] , who verbally acknowledged these results. IMPRESSION: 1. Abbreviated protocol motion degraded examination, as described. 2. Subcentimeter acute cortically-based infarcts within the lateral right frontal lobe and posterior left frontal lobe. Patchy small acute infarcts within the callosal splenium on the right. Additional small recent infarcts (measuring up to 12 mm) are questioned within the left frontoparietal white matter. Given small infarcts involving multiple vascular territories, there is suspicion for an embolic process. 3. Small chronic cortical/subcortical infarct within the right parietooccipital lobes. 4. Chronic hemorrhagic lacunar infarct within the left caudate nucleus. 5. Background chronic small-vessel ischemic changes within the cerebral white matter, mild but advanced for age. 6. 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 7. Mild mucosal thickening within the bilateral ethmoid sinuses. 8. Trapped fluid within the left petrous apex. Electronically Signed   By: Kellie Simmering D.O.   On: 12/05/2021 14:31   DG Chest Port 1 View  Result Date: 12/14/2021 CLINICAL DATA:  Weakness EXAM: PORTABLE CHEST 1 VIEW COMPARISON:  07/07/2021 FINDINGS: Stable cardiomegaly. Mild pulmonary vascular congestion. No overt pulmonary edema. No focal airspace consolidation. No pleural effusion or pneumothorax. IMPRESSION: Cardiomegaly with mild pulmonary vascular congestion. Electronically Signed   By: Davina Poke D.O.   On: 11/23/2021 18:38   DG Foot 2 Views Left  Result Date: 12/12/2021 CLINICAL DATA:  Pain EXAM: LEFT FOOT -  2 VIEW COMPARISON:  09/15/2021 FINDINGS: Postsurgical changes of transmetatarsal amputation of the first through fifth rays. There is new bony irregularity along the resection margins of the third, fourth, and fifth metatarsals. No acute fracture. No dislocation. No deep soft tissue ulceration is evident radiographically. Severe small vessel atherosclerotic calcifications. 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 New bony irregularity along the resection margins of the third, fourth, and fifth metatarsals concerning for osteomyelitis. Electronically Signed   By: Davina Poke D.O.   On: 12/14/2021 18:37   CT HEAD CODE STROKE WO CONTRAST  Result Date: 11/25/2021 CLINICAL DATA:  Code stroke. Provided history: Neuro deficit, acute, stroke suspected. Additional history provided: Aphasia, left-sided weakness. Last known normal 11:30 a.m. EXAM: CT HEAD WITHOUT CONTRAST TECHNIQUE: Contiguous axial images were obtained from the base of the skull through the vertex without intravenous contrast. RADIATION DOSE REDUCTION: This exam was performed according to the departmental dose-optimization program which includes automated exposure control, adjustment of the mA and/or kV according to patient size and/or use of iterative reconstruction technique. COMPARISON:  Report from head CT 02/21/2002 (images unavailable). FINDINGS: Brain: Mild generalized cerebral atrophy, advanced for age. Small chronic cortical/subcortical infarct within the right parietooccipital lobes. Patchy and ill-defined hypoattenuation within the cerebral white matter, nonspecific but compatible with chronic small vessel ischemic disease. Findings are overall mild, but advanced for age. Small age-indeterminate lacunar infarct within the anterior limb of right internal capsule (series 3, image 18) (series 5, image 32). 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 Scattered supratentorial  calcifications, some many of which appear  parenchymal. There is no acute intracranial hemorrhage. No acute demarcated cortical infarct is identified. No extra-axial fluid collection. No evidence of an intracranial mass. No midline shift. Vascular: No hyperdense vessel. Atherosclerotic calcifications. Skull: No calvarial fracture. Bony protuberance projecting outward from the right frontal calvarium, measuring 1.9 x 1.1 cm in transaxial dimensions, compatible with an osteoma (series 4, image 42). Sinuses/Orbits: Visualized orbits show no acute finding. No significant paranasal sinus disease. ASPECTS ([place] Stroke Program Early CT Score) - Ganglionic level infarction (caudate, lentiform nuclei, internal capsule, insula, M1-M3 cortex): 6 - Supraganglionic infarction (M4-M6 cortex): 3 Total score (0-10 with 10 being normal): 9 Impressions #1 and #2 were communicated to Dr. Rory Percy at 1:50 pmon 11/27/2021 by text page via the [facility] messaging system. IMPRESSION: 1. No acute intracranial hemorrhage or acute demarcated cortical infarct. 2. Age-indeterminate lacunar infarct within the anterior limb of right internal capsule. 3. Small chronic cortical/subcortical infarct within the right parietooccipital lobes. 4. 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 5. Chronic small-vessel ischemic changes within the cerebral white matter, overall mild but advanced for age. 6. Scattered punctate supratentorial parenchymal calcifications. Findings are nonspecific, but may be seen as sequela of neurocysticercosis. 7. Mild cerebral atrophy, advanced for age. 8. 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 Electronically Signed   By: Kellie Simmering D.O.   On: 12/08/2021 13:51     Medications:    ceFEPime (MAXIPIME) IV 1 g (11/27/21 0911)   dextrose 25 mL/hr at 11/27/21 0118   heparin 2,000 Units/hr (11/27/21 0959)   remdesivir 100 mg in NS 100 mL 100 mg (11/27/21 0823)   vancomycin      aspirin   300 mg Rectal Daily   Or   aspirin  325 mg Oral Daily   Chlorhexidine Gluconate Cloth  6 each Topical Daily   collagenase   Topical Daily   acetaminophen **OR** acetaminophen (TYLENOL) oral liquid 160 mg/5 mL **OR** acetaminophen, hydrALAZINE, HYDROmorphone (DILAUDID) injection  Assessment/ Plan:  [name] is a 36 y.o. black female with end stage renal disease on peritoneal dialysis, hypertension, congestive heart failure, diabetes mellitus type II, peripheral vascular disease who is admitted to [facility] on 11/19/2021 for PVD (peripheral vascular disease) ([place]) [I73.9] Pain [R52] Dysarthria [R47.1] Acute CVA (cerebrovascular accident) ([place]) [I63.9] Cerebrovascular 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 ([place]) [I63.40] COVID-19 [U07.1]  [facility] 132kg CCPD 9 hours 5 exchanges 3032mL fills and last fill of 2055mL  End Stage Renal Disease: PD deferred last night. Will continue treatments tonight. Orders prepared. Dextrose 2.5%.   Hypertension: allow for permissive hypertension after ischemic event.  BP soft at 98/35.  Anemia of kidney disease: Hemoglobin 8.6. Hold off on EPO due to ischemic event.   Will continue to monitor  Secondary Hyperparathyroidism with hyperphosphatemia and calciphylaxis: Labs from 2/7: PTH 1882, calcium 8 and phos 8.1. Get sodium thiosulfate weekly.  Continue calcium acetate with meals.   5. 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 Most recent hemoglobin A1c is 5.1 on 12/02/2021. Hypoglycemic this morning to 54. Treated and placed on D10 at 65ml./hr  6. Ischemic stroke: as per neuro  7. Peripheral vascular disease: as per podiatry and vascular.   8. COVID-19 infection: placed on isolation and remdesivir. Support care.    LOS: 1   2/11/202310:48 AM

## 2021-11-27 NOTE — Progress Notes (Addendum)
Hypoglycemic Event  CBG: 54   Treatment: D50 25 mL (12.5 gm)  Symptoms: None  Follow-up CBG: Time: 1257 CBG Result:127  Possible Reasons for Event: Other: NPO s/p cva  Comments/MD notified: Sharion Settler NP,new orders for D10 at 25 cc/hr    Gerhard Perches

## 2021-11-27 NOTE — Assessment & Plan Note (Addendum)
No obvious peripheral edema. - Fluid balance through HD

## 2021-11-27 NOTE — Assessment & Plan Note (Addendum)
-  Non-invasive angiography with MRA head pending -Echocardiogram shows MV thickening ?vegetation --> TEE planned -Carotid imaging pending - LE [place] pending -Lipids ordered: LDL 76 continue atorvastatin when able to take p.o. -Aspirin suppository - SLP eval

## 2021-11-27 NOTE — Progress Notes (Signed)
°  Chaplain On-Call responded to Rapid Response notification for CT Scan room #2 at 1424 hours.  Arrived in the CT area. Staff reported that the patient had returned to room 245.  Chaplain went to Unit 2-A; spoke with Nurse who reported the patient is doing better and is resting now.  Chaplain can be contacted if additional support is requested.  Chaplain Pollyann Samples M.Div., [facility]

## 2021-11-27 NOTE — Assessment & Plan Note (Addendum)
Stable relative to baseline, no clinical bleeding

## 2021-11-27 NOTE — Assessment & Plan Note (Addendum)
BP 120s/80s with art line today. -Hold amlodipine, losartan, Coreg

## 2021-11-27 NOTE — Progress Notes (Signed)
Called by CT saying patients sat dropped to 75% on Ra. Told them to call rapid response I will be down asap. Upon arriving to CT, patient was lying flat RT as bedside. Patient placed on NRB. ICU charge nurse Joellen Jersey arrived at bedside. Once on NRB sat recovered into 90's quickly. Patient brought back up to the floor. Bp 110/58. Patient taken off NRB, put on o2 at 2l. Dr. Loleta Books on the floor, ordered ABG AND CXR.

## 2021-11-27 NOTE — Progress Notes (Signed)
OT Cancellation Note  Patient Details Name: MERIAL MORITZ MRN: [id number] DOB: 1986-06-19   Cancelled Treatment:    Reason Eval/Treat Not Completed: Patient at procedure or test/ unavailable Pt OTF to CT at this time. Will f/u at later date/time for OT evaluation as able/available/appropriate. Thank you.  Gerrianne Scale, [place], OTR/L ascom [phone] 11/27/21, 2:03 PM

## 2021-11-27 NOTE — Consult Note (Signed)
NAME:  [name], MRN:  [id number], DOB:  1985/11/19, LOS: 1 ADMISSION DATE:  12/02/2021, CONSULTATION DATE:  11/27/21 REFERRING MD:  Dr. Loleta Books, CHIEF COMPLAINT:  CODE STROKE   History of Present Illness:  36 yo F presenting to [facility] ED with complaints of difficulty speaking & left sided hemiparesis around 11:30 AM on 11/19/2021.  (All information gathered from chart review and attending/EDP documentation as patient is unable to participate in interview and there is no family at bedside.) Patient was originally scheduled for a vascular procedure on 11/25/2021 and prior to arriving she had taken antihypertensives at which point her mother states the patient became confused and had difficulty speaking as well as difficulty moving her left side.   ED course: CODE STROKE initiated patient evaluated by neurology.  Patient speech had improved as well as left-sided weakness, however she remains encephalopathic. MRI showed acute and subacute CVA in multiple vascular territories and the patient was admitted by [facility] for further stroke work-up.  It was also noted on admission the patient was pulseless in the right foot for which vascular surgery was consulted. medications given: Patient was started on heparin drip per vascular surgery recommendation of which neurology is aware. Initial Vitals: Afebrile at 98.8, mildly tachypneic at 21, NSR at 88, BP soft 85/74 > 95/74 with SPO2 88% on 2 L nasal cannula. Significant labs: (Labs/ Imaging personally reviewed) I, Domingo Pulse Rust-Chester, AGACNP-BC, personally viewed and interpreted this ECG. 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 new bony irregularity along the resection margins of the third, fourth and fifth metatarsals concerning for osteomyelitis MRI brain without contrast 11/20/2021: Subcentimeter acute cortically based infarcts with the lateral right frontal lobe and posterior left frontal lobe. patchy small acute infarcts within the callosal splenium on the right additional small recent infarcts (measuring up to 12 mm) are questioned within the left frontoparietal white matter given the small infarcts involving multiple vascular territories there is a suspicion for embolic process. small chronic cortical and subcortical infarct within the right parietoccipital lobes.  Chronic hemorrhagic lacunar infarct within the left caudate nucleus.  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  Mild mucosal thickening within the bilateral ethmoid sinuses.  Trapped fluid within the left petrous apex.  Hospital course: [facility] consulted for admission and neurology consulted for acute /subacute multiple infarcts.  Nephrology consulted due to peritoneal dialysis requirements.  Vascular surgery consulted for assistance with right lower extremity ischemia.  Patient was started on heparin drip with additional imaging and work-up for acute and subacute CVA.  Antibiotics started for concerns of osteomyelitis in the foot.  Echocardiogram 11/27/2021: LVEF 55 to 60%, no regional wall motion abnormalities, mild concentric LVH, elevated LA pressure, RV systolic function is normal, LA size severely dilated. Restricted anterior and posterior mitral valve leaflet motion compared with the echo in 2017 with concern for endocarditis. Moderate MR, Severe MS, moderate-severe TR, no AS.   11/27/21: Early in the day neurology  documented the patient has aphasic but inact in terms of following  commands, with improved left sided hemiparesis and new right arm drift. Per TRH documentation, patient received Dilaudid for severe pain in ischemic LE, then waking up from a nap more confused with more Right sided hemiparesis. STAT CTH ordered. While in CT patient became acutely hypoxic when laid flat, desaturations in the 70's. Rapid response called and patient transferred to SDU for monitoring. CT head wo contrast 11/27/21: No new intracranial abnormalities  PCCM consulted for additional management and monitoring while in SDU.  Pertinent  Medical History  PAD Anemia of chronic disease T2DM Asthma ESRD on PD HFpEF HTN Obesity calciphylaxis  Significant Hospital Events: Including procedures, antibiotic start and stop dates in addition to other pertinent events   12/07/2021: Admit to inpatient as CODE STROKE with multiple acute/subacute infarcts on MRI, ESRD on PD, acute RLE ischemia & suspected osteomyelitis on L foot with Neurology, Nephrology & Vascular surgery following 11/27/21: Echo concerning for endocarditis, new confusion/right sided hemiparesis-STAT CT ordered, but when patient laid flat became hypoxic- rapid response to SDU with PCCM consult.  Interim History / Subjective:  Patient drowsy at first, then moaning and responsive to painful stimuli. Non-verbal/aphasic, moves all extremities, does not appear to be following commands. Per nursing and previous documentation she had been even more lethargic prior to PD initiation. Otherwise mentation has been unchanged since this afternoon.  Objective   Blood pressure 99/78, pulse 89, temperature (!) 93.4 F (34.1 C), temperature source Axillary, resp. rate (!) 24, height 5' 6"  (1.676 m), weight 133.4 kg, SpO2 100 %.        Intake/Output Summary (Last 24 hours) at 11/27/2021 2006 Last data filed at 11/27/2021 1850 Gross per 24 hour  Intake 1316.51 ml  Output 0 ml  Net  1316.51 ml   Filed Weights   11/27/21 0100 11/27/21 1628  Weight: 131.1 kg 133.4 kg    Examination: General: Adult female, acutely ill, lying in bed, NAD HEENT: MM pink/moist, anicteric, atraumatic, neck supple Neuro: RASS: - 2, opens eyes to her name, only verbalizing in moans, withdraws to pain in all extremities, unable to follow commands, PERRL +3, MAE CV: s1s2 RRR, NSR on monitor, no r/m/g Pulm: Regular, non labored on room air, breath sounds diminished throughout GI: soft, rounded, non tender, bs x 4 Skin: no rashes/lesions noted Extremities: warm/dry, pulses + 2 R/P, no edema noted  Resolved Hospital Problem list     Assessment & Plan:  Multiple Acute/subacute infarcts  PMHx: HLD, HTN - modified NIHSS Q 4 - NIHSS Q shift - SLP/PT/OT consulted - Neurology following, appreciate input - MRA, carotid US pending - ASA suppository & Lipitor ordered per primary service  Leukocytosis Suspected Osteomyelitis Query Endocarditis - continue cefepime & vancomycin - will need TEE > defer to primary service - daily CBC, monitor WBC/fever curve - f/u cultures - consider ID consult - continuous cardiac monitoring - BP evaluation inconsistent, concern regarding accurate readings. BP in ICU soft but MAP > 65. PCCM contacted regarding arterial line placement. Discussed with Dr. Lanney Gins that in the setting of severe PAD in BLE and steal syndrome with old fistula would defer any central line placement to vascular surgery if needed. Dr. Lanney Gins in agreement with plan recommending that overnight will treat based on clinical appearance.  Type 2 Diabetes Mellitus Controlled, HgbA1c: 5.1  - Q 4 CBG monitoring - SSI not added at this time, NPO CBG WNL - target CBG range 140-180 - follow ICU hypo/hyper-glycemia protocol  Acute Hypoxic Respiratory Failure - resolved COVID-19  infection  Patient currently on room air, not hypoxic. Suspect secondary to positioning for CT and body  habitus - Supplemental O2 to maintain SpO2 > 90% - Intermittent chest x-ray & ABG PRN - Ensure adequate pulmonary hygiene  - Continue Remdesivir - bronchodilators PRN  ESRD on PD - nephrology following, appreciate input - daily BMP, replace electrolytes PRN - strict I&O's  Ischemic RLE in the setting of severe PAD - vascular surgery following, appreciate input - on heparin drip per pharmacy protocol  Best Practice (right click and "Reselect all SmartList Selections" daily)  Diet/type: NPO DVT prophylaxis: systemic heparin GI prophylaxis: PPI Lines: N/A Foley:  N/A Code Status:  full code Last date of multidisciplinary goals of care discussion [per primary service]  Labs   CBC: Recent Labs  Lab 11/24/21 1005 11/30/2021 1320 12/11/2021 1412 11/27/21 0528  WBC 16.9* 17.2* 16.6* 14.4*  NEUTROABS  --  14.0* 14.0* 11.8*  HGB 9.1* 9.2* 9.4* 8.6*  HCT 28.0* 28.4* 29.6* 27.4*  MCV 95.6 99.6 98.7 100.4*  PLT 253 280 247 893    Basic Metabolic Panel: Recent Labs  Lab 11/24/21 1005 11/22/2021 1412 11/27/21 0528  NA 136 140 138  K 3.1* 3.8 4.1  CL 94* 94* 94*  CO2 19* 20* 17*  GLUCOSE 110* 122* 98  BUN 69* 65* 67*  CREATININE 12.79* 11.34* 12.01*  CALCIUM 7.8* 8.3* 8.2*   GFR: Estimated Creatinine Clearance: 9.1 mL/min (A) (by C-G formula based on SCr of 12.01 mg/dL (H)). Recent Labs  Lab 11/24/21 1005 12/12/2021 1320 12/12/2021 1412 11/27/21 0528  WBC 16.9* 17.2* 16.6* 14.4*    Liver Function Tests: Recent Labs  Lab 12/08/2021 1412 11/27/21 0528  AST 11* 11*  ALT 7 6  ALKPHOS 131* 124  BILITOT 1.0 0.7  PROT 7.1 6.6  ALBUMIN 2.8* 2.7*   No results for input(s): LIPASE, AMYLASE in the last 168 hours. No results for input(s): AMMONIA in the last 168 hours.  ABG    Component Value Date/Time   PHART 7.32 (L) 11/27/2021 1449   PCO2ART 26 (L) 11/27/2021 1449   PO2ART 106 11/27/2021 1449   HCO3 13.4 (L) 11/27/2021 1449   TCO2 26 09/08/2021 1418   ACIDBASEDEF  11.3 (H) 11/27/2021 1449   O2SAT 97.6 11/27/2021 1449     Coagulation Profile: Recent Labs  Lab 11/19/2021 2222  INR 1.3*    Cardiac Enzymes: No results for input(s): CKTOTAL, CKMB, CKMBINDEX, TROPONINI in the last 168 hours.  HbA1C: Hgb A1c MFr Bld  Date/Time Value Ref Range Status  11/25/2021 06:30 PM 5.1 4.8 - 5.6 % Final    Comment:    (NOTE) Pre diabetes:          5.7%-6.4%  Diabetes:              >6.4%  Glycemic control for   <7.0% adults with diabetes   11/24/2021 10:05 AM 5.0 4.8 - 5.6 % Final    Comment:    (NOTE) Pre diabetes:          5.7%-6.4%  Diabetes:              >6.4%  Glycemic control for   <7.0% adults with diabetes     CBG: Recent Labs  Lab 11/27/21 0653 11/27/21 0655 11/27/21 1157 11/27/21 1629 11/27/21 1902  GLUCAP 96 85 78 75 81    Review of Systems:   UTA- patient unable to participate in interview   Past Medical History:  She,  has  a past medical history of Anemia of chronic disease, Asthma, Calciphylaxis (2020), CHF (congestive heart failure) ([place]), CKD (chronic kidney disease), stage III ([place]), Diabetes ([place]), Dyspnea, Hypertension, Morbid obesity ([place]), Nephrotic syndrome, PAD (peripheral artery disease) ([place]), and Sepsis ([place]) (06/2021).   Surgical History:   Past Surgical History:  Procedure Laterality Date   A/V FISTULAGRAM Left 10/29/2020   Procedure: A/V FISTULAGRAM;  Surgeon: Algernon Huxley, MD;  Location: [place] [facility];  Service: Cardiovascular;  Laterality: Left;   APPLICATION OF WOUND VAC Bilateral 09/15/2019   Procedure: APPLICATION OF WOUND VAC;  Surgeon: Benjamine Sprague, DO;  Location: [facility];  Service: General;  Laterality: Bilateral;   AV FISTULA PLACEMENT Left 07/09/2020   Procedure: ARTERIOVENOUS (AV) FISTULA CREATION ( BRACHIAL CEPHALIC);  Surgeon: Algernon Huxley, MD;  Location: [facility];  Service: Vascular;  Laterality: Left;   DIALYSIS/PERMA CATHETER INSERTION N/A 02/28/2019   Procedure: DIALYSIS/PERMA  CATHETER INSERTION;  Surgeon: Algernon Huxley, MD;  Location: [place] [facility];  Service: Cardiovascular;  Laterality: N/A;   DIALYSIS/PERMA CATHETER INSERTION N/A 09/17/2019   Procedure: DIALYSIS/PERMA CATHETER EXCHANGE;  Surgeon: Katha Cabal, MD;  Location: [place] [facility];  Service: Cardiovascular;  Laterality: N/A;   DIALYSIS/PERMA CATHETER REMOVAL N/A 03/18/2020   Procedure: DIALYSIS/PERMA CATHETER REMOVAL;  Surgeon: Algernon Huxley, MD;  Location: [place] [facility];  Service: Cardiovascular;  Laterality: N/A;   LIGATION OF ARTERIOVENOUS  FISTULA Left 06/03/2021   Procedure: LIGATION OF ARTERIOVENOUS  FISTULA;  Surgeon: Algernon Huxley, MD;  Location: [facility];  Service: Vascular;  Laterality: Left;   LOWER EXTREMITY ANGIOGRAPHY Left 12/02/2020   Procedure: LOWER EXTREMITY ANGIOGRAPHY;  Surgeon: Algernon Huxley, MD;  Location: [place] [facility];  Service: Cardiovascular;  Laterality: Left;   LOWER EXTREMITY ANGIOGRAPHY Right 01/25/2021   Procedure: LOWER EXTREMITY ANGIOGRAPHY;  Surgeon: Algernon Huxley, MD;  Location: [place] [facility];  Service: Cardiovascular;  Laterality: Right;   LOWER EXTREMITY ANGIOGRAPHY Left 04/15/2021   Procedure: LOWER EXTREMITY ANGIOGRAPHY;  Surgeon: Algernon Huxley, MD;  Location: [place] [facility];  Service: Cardiovascular;  Laterality: Left;   LOWER EXTREMITY ANGIOGRAPHY Right 05/20/2021   Procedure: LOWER EXTREMITY ANGIOGRAPHY;  Surgeon: Algernon Huxley, MD;  Location: [place] [facility];  Service: Cardiovascular;  Laterality: Right;   LOWER EXTREMITY ANGIOGRAPHY Left 05/27/2021   Procedure: LOWER EXTREMITY ANGIOGRAPHY;  Surgeon: Algernon Huxley, MD;  Location: [place] [facility];  Service: Cardiovascular;  Laterality: Left;   LOWER EXTREMITY ANGIOGRAPHY Right 08/04/2021   Procedure: LOWER EXTREMITY ANGIOGRAPHY;  Surgeon: Algernon Huxley, MD;  Location: [place] [facility];  Service: Cardiovascular;  Laterality: Right;   LOWER EXTREMITY ANGIOGRAPHY Left 11/15/2021    Procedure: LOWER EXTREMITY ANGIOGRAPHY;  Surgeon: Algernon Huxley, MD;  Location: [place] [facility];  Service: Cardiovascular;  Laterality: Left;   pd cath  05/21/2019   TENDON LENGTHENING Left 09/08/2021   Procedure: TENDON ACHILLES LENGTHENING;  Surgeon: Criselda Peaches, DPM;  Location: [facility];  Service: Podiatry;  Laterality: Left;   TONSILLECTOMY     TRANSMETATARSAL AMPUTATION Left 09/08/2021   Procedure: TRANSMETATARSAL AMPUTATION;  Surgeon: Criselda Peaches, DPM;  Location: [facility];  Service: Podiatry;  Laterality: Left;   UPPER EXTREMITY ANGIOGRAPHY Left 04/15/2021   Procedure: UPPER EXTREMITY ANGIOGRAPHY;  Surgeon: Algernon Huxley, MD;  Location: [place] [facility];  Service: Cardiovascular;  Laterality: Left;   WOUND DEBRIDEMENT Bilateral 09/15/2019   Procedure: DEBRIDEMENT WOUND;  Surgeon: Benjamine Sprague, DO;  Location: [facility];  Service: General;  Laterality: Bilateral;     Social History:   reports that she has quit smoking. Her smoking use included cigars. She has never used smokeless tobacco. She reports that she does not drink alcohol and does not use drugs.   Family History:  Her family history includes CAD in her mother; Cancer in her maternal grandfather and maternal grandmother; Hypertension in her mother; Kidney disease in her father; Multiple sclerosis in her mother.   Allergies Allergies  Allergen Reactions   Percocet [Oxycodone-Acetaminophen] Itching   Tomato Itching   Gabapentin Other (See Comments)    Extremity tremors     Home Medications  Prior to Admission medications   Medication Sig Start Date End Date Taking? Authorizing Provider  amLODipine (NORVASC) 10 MG tablet Take 10 mg by mouth daily as needed (elevated blood pressure).   Yes [provider]  aspirin EC 81 MG EC tablet Take 1 tablet (81 mg total) by mouth daily. 02/16/16  Yes Demetrios Loll, MD  atorvastatin (LIPITOR) 20 MG tablet Take 1 tablet (20 mg total) by mouth daily. 11/15/21  Yes  Dew, Erskine Squibb, MD  carvedilol (COREG) 25 MG tablet Take 25 mg by mouth 2 (two) times daily as needed (elevated blood pressure).   Yes [provider]  clopidogrel (PLAVIX) 75 MG tablet Take 1 tablet (75 mg total) by mouth daily. 11/15/21  Yes Dew, Erskine Squibb, MD  isosorbide mononitrate (IMDUR) 30 MG 24 hr tablet Take 30 mg by mouth 2 (two) times daily as needed (elevated blood pressure).   Yes [provider]  losartan (COZAAR) 100 MG tablet Take 100 mg by mouth daily as needed (elevated blood pressure).   Yes [provider]  cilostazol (PLETAL) 100 MG tablet Take 1 tablet (100 mg total) by mouth 2 (two) times daily before a meal. Patient not taking: Reported on 12/03/2021 05/07/21   Kris Hartmann, NP  lactulose (CHRONULAC) 10 GM/15ML solution Take 30 mLs (20 g total) by mouth 2 (two) times daily as needed for mild constipation. Patient not taking: Reported on 12/12/2021 07/09/21   Edwin Dada, MD  multivitamin (RENA-VIT) TABS tablet Take 1 tablet by mouth at bedtime. Patient not taking: Reported on 11/15/2021 09/18/19   Fritzi Mandes, MD  ondansetron (ZOFRAN) 4 MG tablet Take 1 tablet (4 mg total) by mouth every 6 (six) hours as needed for nausea. Patient not taking: Reported on 11/09/2021 09/08/21   Criselda Peaches, DPM     Critical care time: 51 minutes       Domingo Pulse Rust-Chester, AGACNP-BC Acute Care Nurse Practitioner [facility]   [phone] / [phone] Please see Amion for pager details.

## 2021-11-27 NOTE — Assessment & Plan Note (Signed)
Hgb no change from baseline

## 2021-11-27 NOTE — Progress Notes (Incomplete)
Patient's mother at bedside to sign dialysis consent.  Mother said patient is not to have Dilaudid because it bottoms out her blood pressure.  Dialysis nurse at bedside to start PD dialysis but mother is refusing because she if afraid of her pulling out her PD line.  Family doesn't want an a line because she has endured enough pain.

## 2021-11-27 NOTE — Assessment & Plan Note (Signed)
BMI 46 ?

## 2021-11-27 NOTE — Consult Note (Signed)
[place] for heparin Indication:  Thrombus  Allergies  Allergen Reactions   Percocet [Oxycodone-Acetaminophen] Itching   Tomato Itching   Gabapentin Other (See Comments)    Extremity tremors    Patient Measurements: Height: 5\' 6"  (167.6 cm) Weight: 131.1 kg (289 lb 0.4 oz) IBW/kg (Calculated) : 59.3 Heparin Dosing Weight: 92.3 Kg  Vital Signs: Temp: 97.6 F (36.4 C) (02/11 0820) Temp Source: Axillary (02/11 0820) BP: 74/40 (02/11 0835) Pulse Rate: 107 (02/11 0900)  Labs: Recent Labs    11/24/21 1005 11/20/2021 1320 12/05/2021 1412 11/30/2021 2222 11/27/21 0528 11/27/21 0906  HGB 9.1* 9.2* 9.4*  --  8.6*  --   HCT 28.0* 28.4* 29.6*  --  27.4*  --   PLT 253 280 247  --  242  --   APTT  --   --   --  38*  --   --   LABPROT  --   --   --  16.3*  --   --   INR  --   --   --  1.3*  --   --   HEPARINUNFRC  --   --   --  <0.10*  --  0.12*  CREATININE 12.79*  --  11.34*  --  12.01*  --      Estimated Creatinine Clearance: 9 mL/min (A) (by C-G formula based on SCr of 12.01 mg/dL (H)).   Medical History: Past Medical History:  Diagnosis Date   Anemia of chronic disease    Asthma    well controlled-no inhaler   Calciphylaxis 2020   CHF (congestive heart failure) ([place])    CKD (chronic kidney disease), stage III ([place])    Diabetes ([place])    type 2-diet controlled   Dyspnea    Hypertension    Morbid obesity ([place])    Nephrotic syndrome    PAD (peripheral artery disease) ([place])    Sepsis ([place]) 06/2021    Medications:  No chronic anticoagulation PTA  Assessment: Pharmacy has been consulted to initiate Heparin in 35yo patient admitted with stroke-like symptoms. Her symptoms began to resolve, but neurologist noted concern for lower extremity ischemia, likely embolic.   Goal of Therapy:  Heparin level 0.3-0.5 units/ml(Per Stroke Protocol) Monitor platelets by anticoagulation protocol: Yes  2/10 2222 HL < 0.10, subtherapeutic 2/11  0906 HL 0.12, subtherapeutic   Plan:  Per Neurologist, will avoid bolus x 24 hours Increase heparin infusion to 2000 units/hr Recheck HL in 8 hours after rate change Continue to monitor H&H and platelets  [name], PharmD Clinical Pharmacist 11/27/2021 10:04 AM

## 2021-11-27 NOTE — Progress Notes (Signed)
Initiation of PD treatment, catheter intact, no fibrin seen in initial drain. Patient is restless, with signs of discomfort, primary nurse gives pain med with relief. Cycler enters into first fill without issue. Explained to family at bedside timeframe for returning to remove patient from cycler. Patient resting in upright position, call light within reach.

## 2021-11-27 NOTE — Progress Notes (Addendum)
SLP Cancellation Note  Patient Details Name: [name] MRN: [id number] DOB: 06/19/1986   Cancelled treatment:        Chart reviewed and attempted to see Pt. MD and nursing just leaving the room. Discussed status with Nsg. Nsg agrees Pt is not alert enough to participate in swallow eval today. She was in a lot of pain this am which is currently controlled with pain meds. Will reattempt swallow eval tomorrow in hopes of overall improvement. Currenly NPO   Lucila Maine 11/27/2021, 1:07 PM

## 2021-11-27 NOTE — Progress Notes (Signed)
*  PRELIMINARY RESULTS* Echocardiogram 2D Echocardiogram has been performed.  Claretta Fraise 11/27/2021, 8:26 AM

## 2021-11-27 NOTE — Assessment & Plan Note (Addendum)
-   Continue heparin - Continue aspirin suppository - Resume statin when able to take PO

## 2021-11-27 NOTE — Progress Notes (Signed)
PT Cancellation Note  Patient Details Name: [name] MRN: [id number] DOB: July 09, 1986   Cancelled Treatment:    Reason Eval/Treat Not Completed: Other (comment) PT orders received, chart reviewed. Coordinated time to see pt with OT & nurse then pt being taken off unit for CT scan. Will f/u as able & as pt is available to participate in PT evaluation.  Lavone Nian, PT, DPT 11/27/21, 2:02 PM   [name] 11/27/2021, 2:02 PM

## 2021-11-27 NOTE — Progress Notes (Addendum)
Neurology Progress Note   S:// No new changes reported overnight. Vascular surgery and nephrology following Noted to have pulseless right foot and placed on heparin for concern for lower extremity arterial thrombus. Also noted to be incidentally COVID-positive.  No symptoms.   O:// Current vital signs: BP (!) 74/40    Pulse (!) 107    Temp 97.6 F (36.4 C) (Axillary)    Resp (!) 28    Ht 5\' 6"  (1.676 m)    Wt 131.1 kg    SpO2 100%    BMI 46.65 kg/m  Vital signs in last 24 hours: Temp:  [97.6 F (36.4 C)-98.8 F (37.1 C)] 97.6 F (36.4 C) (02/11 0820) Pulse Rate:  [88-109] 107 (02/11 0840) Resp:  [12-38] 28 (02/11 0840) BP: (74-106)/(35-91) 74/40 (02/11 0835) SpO2:  [88 %-100 %] 100 % (02/11 0840) Weight:  [131.1 kg] 131.1 kg (02/11 0100) General: Sick appearing obese woman, appears older than stated age, laying in bed  HEENT: Normocephalic, atraumatic CVS: Regular rhythm Abdomen: Obese without tenderness Extremities: Puckered and macerated looking skin with right calf ulcer that looks healed, both feet in bandages Respiratory: Breathing well saturating normally on room air Neurological exam She is awake, alert, oriented to self-nodded yes when I asked her if her name is [name].  Appropriate nodding yes and no to questions but remains nonverbal and having a difficult time speaking.  Able to follow all commands. Cranial nerves: Pupils equal round react light, extraocular movements intact, visual fields appear full, face appears grossly symmetric, tongue and palate midline. Motor examination with antigravity strength in all 4 extremities but right upper extremity has a drift and so to both the lower extremities. Sensation intact to touch Coordination difficult to assess but no gross dysmetria based on [employer].   Medications  Current Facility-Administered Medications:    acetaminophen (TYLENOL) tablet 650 mg, 650 mg, Oral, Q4H PRN **OR** acetaminophen (TYLENOL) 160  MG/5ML solution 650 mg, 650 mg, Per Tube, Q4H PRN, 650 mg at 11/27/21 0123 **OR** acetaminophen (TYLENOL) suppository 650 mg, 650 mg, Rectal, Q4H PRN, Rise Patience, MD, 650 mg at 11/27/21 0827   aspirin suppository 300 mg, 300 mg, Rectal, Daily, 300 mg at 11/27/21 0826 **OR** aspirin tablet 325 mg, 325 mg, Oral, Daily, Rise Patience, MD, 325 mg at 11/21/2021 1804   ceFEPIme (MAXIPIME) 1 g in sodium chloride 0.9 % 100 mL IVPB, 1 g, Intravenous, Daily, Darnelle Bos, RPH, Stopped at 11/21/2021 1851   Chlorhexidine Gluconate Cloth 2 % PADS 6 each, 6 each, Topical, Daily, Rise Patience, MD   collagenase (SANTYL) ointment, , Topical, Daily, Rise Patience, MD, Given at 11/27/21 0839   dextrose 10 % infusion, , Intravenous, Continuous, Sharion Settler, NP, Last Rate: 25 mL/hr at 11/27/21 0118, New Bag at 11/27/21 0118   heparin ADULT infusion 100 units/mL (25000 units/243mL), 1,750 Units/hr, Intravenous, Continuous, Belue, Alver Sorrow, RPH, Last Rate: 17.5 mL/hr at 11/27/21 0828, 1,750 Units/hr at 11/27/21 3235   hydrALAZINE (APRESOLINE) injection 5 mg, 5 mg, Intravenous, Q4H PRN, Rise Patience, MD   [COMPLETED] remdesivir 200 mg in sodium chloride 0.9% 250 mL IVPB, 200 mg, Intravenous, Once, Last Rate: 580 mL/hr at 12/13/2021 1846, 200 mg at 12/13/2021 1846 **FOLLOWED BY** remdesivir 100 mg in sodium chloride 0.9 % 100 mL IVPB, 100 mg, Intravenous, Daily, Rise Patience, MD, Last Rate: 200 mL/hr at 11/27/21 0823, 100 mg at 11/27/21 0823  Labs CBC    Component Value Date/Time  WBC 14.4 (H) 11/27/2021 0528   RBC 2.73 (L) 11/27/2021 0528   HGB 8.6 (L) 11/27/2021 0528   HGB 12.5 12/31/2013 1020   HCT 27.4 (L) 11/27/2021 0528   HCT 38.5 12/31/2013 1020   PLT 242 11/27/2021 0528   PLT 239 12/31/2013 1020   MCV 100.4 (H) 11/27/2021 0528   MCV 84 12/31/2013 1020   MCH 31.5 11/27/2021 0528   MCHC 31.4 11/27/2021 0528   RDW 17.9 (H) 11/27/2021 0528   RDW 15.2 (H)  12/31/2013 1020   LYMPHSABS 1.8 11/27/2021 0528   MONOABS 0.5 11/27/2021 0528   EOSABS 0.2 11/27/2021 0528   BASOSABS 0.0 11/27/2021 0528    CMP     Component Value Date/Time   NA 138 11/27/2021 0528   NA 135 (L) 12/31/2013 1020   K 4.1 11/27/2021 0528   K 3.9 12/31/2013 1020   CL 94 (L) 11/27/2021 0528   CL 105 12/31/2013 1020   CO2 17 (L) 11/27/2021 0528   CO2 26 12/31/2013 1020   GLUCOSE 98 11/27/2021 0528   GLUCOSE 243 (H) 12/31/2013 1020   BUN 67 (H) 11/27/2021 0528   BUN 19 (H) 12/31/2013 1020   CREATININE 12.01 (H) 11/27/2021 0528   CREATININE 2.12 (H) 12/31/2013 1020   CALCIUM 8.2 (L) 11/27/2021 0528   CALCIUM 8.5 12/31/2013 1020   PROT 6.6 11/27/2021 0528   PROT 7.4 12/31/2013 1020   ALBUMIN 2.7 (L) 11/27/2021 0528   ALBUMIN 3.1 (L) 12/31/2013 1020   AST 11 (L) 11/27/2021 0528   AST 21 12/31/2013 1020   ALT 6 11/27/2021 0528   ALT 26 12/31/2013 1020   ALKPHOS 124 11/27/2021 0528   ALKPHOS 98 12/31/2013 1020   BILITOT 0.7 11/27/2021 0528   BILITOT 0.4 12/31/2013 1020   GFRNONAA 4 (L) 11/27/2021 0528   GFRNONAA 31 (L) 12/31/2013 1020   GFRAA 5 (L) 06/18/2020 1217   GFRAA 36 (L) 12/31/2013 1020    glycosylated hemoglobin-5.1  Lipid Panel     Component Value Date/Time   CHOL 125 12/09/2021 1830   TRIG 73 12/02/2021 1830   HDL 34 (L) 12/02/2021 1830   CHOLHDL 3.7 12/10/2021 1830   VLDL 15 11/30/2021 1830   LDLCALC 76 12/09/2021 1830   2D echo pending  Imaging I have reviewed images in epic and the results pertinent to this consultation are: CT-head-no acute changes.  No bleed.   MRI examination of the brain-scattered infarcts in the lateral right frontal lobe, posterior left frontal lobe with patchy acute small infarcts in the callosal splenium on the right with additional subacute infarcts question within the left frontoparietal white matter.  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  Background chronic small vessel ischemic changes  within the cerebral white matter.  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  Small chronic cortical subcortical infarcts within the right parietal occipital lobes.  MRI brain without contrast-pending Carotid Dopplers-pending  Foot x-ray concerning for osteomyelitis.   Assessment: 36 year old woman with extensive vascular risk factor history presenting with strokelike symptoms and altered mental status.  EMS was called to evaluate for altered mental status which her mother think was related to her taking her antihypertensive medications at that time.  On initial evaluation by EMS she had left-sided arm drift and speech difficulty.  On my evaluation initially she did have some left leg weakness and mild left facial droop along with dysarthria.  She was also encephalopathic on her exam. Stat MRI was  performed which showed infarcts of different ages precluding her from getting IV thrombolysis.  Her poor modified Rankin score precludes endovascular thrombectomy. Admitted for stroke work-up. Today's exam-review of expressive aphasia but she is intact in terms of following commands.  Her weakness of the left side is less with some right arm drift noted today.  Both legs are antigravity. She was also noted to have COVID-19 infection which is likely incidental as she did not have any respiratory symptoms. She has been started on remdesivir. She is also started on heparin on the recommendations of vascular surgery due to concern for right lower extremity thrombus. The cause of her strokes is still under investigation-I would not be surprised if she has a venous clot with a PFO.  I would get bilateral lower extremity venous Dopplers as well.  Recommendations: 2D echo-pending MRA head-pending Carotid Dopplers-pending We will order lower extremity venous Dopplers Continue Plavix and high intensity statin. Appreciate nephrology consultation Continue heparin stroke protocol for  possible lower extremity arterial thrombus.  Appreciate vascular surgery consultation. Blood pressure goal systolic 450-3 40 from a neurological standpoint. May need TEE if the TTE is unremarkable. Given concern for osteomyelitis of the foot, she is already been started on antibiotics.  I would also recommend obtaining blood cultures. I will follow Plan relayed to Dr. Loleta Books   Addendum In the afternoon, concern for patient's right weakness being worse. On repeat examination, still remains aphasic-did not speak much but was able to follow simple commands.  Appears very encephalopathic. ABG-pH 7.32, PCO2 26, PO2 106.  Bicarb 13.4. Sent for repeat head CT with no change. 2D echo completed: LVEF 55 to 60% with no regional wall motion abnormalities.  Elevated left atrial pressure.  Left atrium severely dilated.  Restricted anterior and posterior mitral valve leaflet motion compared with echo of 2017 the mitral valve is now heavily thickened and calcified.  These findings are new.  Recommended TEE for evaluation.   Given concern for endocarditis, needs cultures and endocarditis work-up. I would at this point stop the Plavix until a CT angiogram or MR angiogram is able to rule out mycotic aneurysms. I will continue to follow with you. Plan discussed with Dr. [place] Service in person.  -- Amie Portland, MD Neurologist [facility] Pager: [phone]

## 2021-11-27 NOTE — Assessment & Plan Note (Addendum)
Pulse diminished in foot.  Suspect central source embolism.    Stroke precludes operative management of thrombosis in leg at this time - Consult Vascular surgery - Continue heparin gtt -Continue cefepime

## 2021-11-27 NOTE — Assessment & Plan Note (Addendum)
-   Consult Nephrology for peritoneal HD

## 2021-11-27 NOTE — Significant Event (Signed)
Rapid Response Event Note   Reason for Call : called to CT for pt with low BP low 02 sat readings   Initial Focused Assessment: CT completed, pt just moved back to regular bed... sats dropped into 80's, and pt placed on NRB, and taked back to room.       Interventions: Dr Loleta Books at bedside, pt now following commands, and VSS, moved backed to nasal cannula. Stat ABG and CXR ordered.   Plan of Care: as above, RN Crystal to call for further assistance.    Event Summary: as above  MD Notified: Danford Call [place] A, RN

## 2021-11-27 NOTE — Progress Notes (Signed)
Mother updated via telephone. Patient VSS and resting comfortably. Patient is able to move all extremities, RUE decreased movement but does respond to pain.  Patient shakes her head, NO, when asked if she is having pain. Patient continues to be non verbal, only making moans and grunts. Patient does attempt to get OOB. Patient instructed to stay in bed, call light placed within reach and bed alarm is on. Mother questioned when the A-line would be placed, I updated her that per Domingo Pulse, NP that the plan is for vascular to place tomorrow due to her being a dialysis patient and her current vascular problems. Peritoneal Dialysis in process, no complications noted.

## 2021-11-27 NOTE — Assessment & Plan Note (Signed)
This was an incidental finding.  No significant airways disease -Continue remdesivir, heparin

## 2021-11-27 NOTE — Consult Note (Signed)
Reason for Consult: Ischemic right extremity more so than left, history of recent intervention bilaterally Referring Physician: Dr. Baldemar Ali is an 36 y.o. female.  HPI: This is a 36 year old female who Dr. Laurence Ali who has done interventional bilateral notes in recent past.  She was to have her left foot undergo a podiatric procedure, but developed at home and mental status changes.  EMS was called.  She was noted to have left-sided drift and speech difficulty.  She was brought to the operating room where her bariatric procedure was appropriately canceled, and admitted.  Stat MRI was performed demonstrating infarcts in different ages extensively present in the brain, bilateral.  She was encephalopathic.  Asked to see her given the ischemia of the right lower extremity.  Most recent ABIs in the office 1.78 on the right, 1.72 on the left on 09 November 2021.  However digital pressures were very low.  Risk factors for vascular disease include renal failure with peritoneal dialysis currently after steal was noted during hemodialysis necessitating fistula ligation.  She has poorly controlled hypertension, diabetes mellitus, obesity, CHF, history of calciphylaxis.  Most recent procedure on the left lower extremity including the left posterior tibial 2 mm PTA, 3 mm PTA proximally, mechanical thrombectomy of the left SFA with Rota Rex device, Viabahn stent placement in the left SFA, and on the right right distal common and proximal external iliac 9 mm Lifestream stent on 15 November 2021.  Preceding this procedure the intervention done on the right side was on 04 August 2021 when she had a mechanical thrombectomy of the right distal SFA and tibioperoneal trunk with penumbra CAT 6, angioplasty of the tibial distributions with 3 mm balloon, anterior tibial 2 mm balloon, primary stent placement right distal SFA.  Of note, she had very poor pedal circulation with severe small vessel disease in the  foot.  In addition she had very poor distal tibial circulation.  We are asked to see her in the context of the acute stroke for ischemic lower extremity in particular on the right.  Rankin score precludes endovascular treatment.  Past Medical History:  Diagnosis Date   Anemia of chronic disease    Asthma    well controlled-no inhaler   Calciphylaxis 2020   CHF (congestive heart failure) ([place])    CKD (chronic kidney disease), stage III ([place])    Diabetes ([place])    type 2-diet controlled   Dyspnea    Hypertension    Morbid obesity ([place])    Nephrotic syndrome    PAD (peripheral artery disease) ([place])    Sepsis ([place]) 06/2021    Past Surgical History:  Procedure Laterality Date   A/V FISTULAGRAM Left 10/29/2020   Procedure: A/V FISTULAGRAM;  Surgeon: Frances Huxley, MD;  Location: [place] [facility];  Service: Cardiovascular;  Laterality: Left;   APPLICATION OF WOUND VAC Bilateral 09/15/2019   Procedure: APPLICATION OF WOUND VAC;  Surgeon: Frances Sprague, DO;  Location: [facility];  Service: General;  Laterality: Bilateral;   AV FISTULA PLACEMENT Left 07/09/2020   Procedure: ARTERIOVENOUS (AV) FISTULA CREATION ( BRACHIAL CEPHALIC);  Surgeon: Frances Huxley, MD;  Location: [facility];  Service: Vascular;  Laterality: Left;   DIALYSIS/PERMA CATHETER INSERTION N/A 02/28/2019   Procedure: DIALYSIS/PERMA CATHETER INSERTION;  Surgeon: Frances Huxley, MD;  Location: [place] [facility];  Service: Cardiovascular;  Laterality: N/A;   DIALYSIS/PERMA CATHETER INSERTION N/A 09/17/2019   Procedure: DIALYSIS/PERMA CATHETER EXCHANGE;  Surgeon: Frances Cabal, MD;  Location: [place] [facility];  Service: Cardiovascular;  Laterality: N/A;   DIALYSIS/PERMA CATHETER REMOVAL N/A 03/18/2020   Procedure: DIALYSIS/PERMA CATHETER REMOVAL;  Surgeon: Frances Huxley, MD;  Location: [place] [facility];  Service: Cardiovascular;  Laterality: N/A;   LIGATION OF ARTERIOVENOUS  FISTULA Left 06/03/2021   Procedure: LIGATION OF  ARTERIOVENOUS  FISTULA;  Surgeon: Frances Huxley, MD;  Location: [facility];  Service: Vascular;  Laterality: Left;   LOWER EXTREMITY ANGIOGRAPHY Left 12/02/2020   Procedure: LOWER EXTREMITY ANGIOGRAPHY;  Surgeon: Frances Huxley, MD;  Location: [place] [facility];  Service: Cardiovascular;  Laterality: Left;   LOWER EXTREMITY ANGIOGRAPHY Right 01/25/2021   Procedure: LOWER EXTREMITY ANGIOGRAPHY;  Surgeon: Frances Huxley, MD;  Location: [place] [facility];  Service: Cardiovascular;  Laterality: Right;   LOWER EXTREMITY ANGIOGRAPHY Left 04/15/2021   Procedure: LOWER EXTREMITY ANGIOGRAPHY;  Surgeon: Frances Huxley, MD;  Location: [place] [facility];  Service: Cardiovascular;  Laterality: Left;   LOWER EXTREMITY ANGIOGRAPHY Right 05/20/2021   Procedure: LOWER EXTREMITY ANGIOGRAPHY;  Surgeon: Frances Huxley, MD;  Location: [place] [facility];  Service: Cardiovascular;  Laterality: Right;   LOWER EXTREMITY ANGIOGRAPHY Left 05/27/2021   Procedure: LOWER EXTREMITY ANGIOGRAPHY;  Surgeon: Frances Huxley, MD;  Location: [place] [facility];  Service: Cardiovascular;  Laterality: Left;   LOWER EXTREMITY ANGIOGRAPHY Right 08/04/2021   Procedure: LOWER EXTREMITY ANGIOGRAPHY;  Surgeon: Frances Huxley, MD;  Location: [place] [facility];  Service: Cardiovascular;  Laterality: Right;   LOWER EXTREMITY ANGIOGRAPHY Left 11/15/2021   Procedure: LOWER EXTREMITY ANGIOGRAPHY;  Surgeon: Frances Huxley, MD;  Location: [place] [facility];  Service: Cardiovascular;  Laterality: Left;   pd cath  05/21/2019   TENDON LENGTHENING Left 09/08/2021   Procedure: TENDON ACHILLES LENGTHENING;  Surgeon: [name], DPM;  Location: [facility];  Service: Podiatry;  Laterality: Left;   TONSILLECTOMY     TRANSMETATARSAL AMPUTATION Left 09/08/2021   Procedure: TRANSMETATARSAL AMPUTATION;  Surgeon: [name], DPM;  Location: [facility];  Service: Podiatry;  Laterality: Left;   UPPER EXTREMITY ANGIOGRAPHY Left 04/15/2021   Procedure: UPPER  EXTREMITY ANGIOGRAPHY;  Surgeon: Frances Huxley, MD;  Location: [place] [facility];  Service: Cardiovascular;  Laterality: Left;   WOUND DEBRIDEMENT Bilateral 09/15/2019   Procedure: DEBRIDEMENT WOUND;  Surgeon: Frances Sprague, DO;  Location: [facility];  Service: General;  Laterality: Bilateral;    Family History  Problem Relation Age of Onset   Hypertension Mother    Multiple sclerosis Mother    CAD Mother    Cancer Maternal Grandmother        Ovarian    Cancer Maternal Grandfather        Prostate    Kidney disease Father     Social History:  reports that she has quit smoking. Her smoking use included cigars. She has never used smokeless tobacco. She reports that she does not drink alcohol and does not use drugs.  Allergies:  Allergies  Allergen Reactions   Percocet [Oxycodone-Acetaminophen] Itching   Tomato Itching   Gabapentin Other (See Comments)    Extremity tremors    Medications: I have reviewed the patient's current medications.  Results for orders placed or performed during the hospital encounter of 12/12/2021 (from the past 48 hour(s))  CBG monitoring, ED     Status: Abnormal   Collection Time: 12/13/2021  1:20 PM  Result Value Ref Range   Glucose-Capillary 115 (H) 70 -  99 mg/dL    Comment: Glucose reference range applies only to samples taken after fasting for at least 8 hours.  Ethanol     Status: None   Collection Time: 11/22/2021  1:20 PM  Result Value Ref Range   Alcohol, Ethyl (B) <10 <10 mg/dL    Comment: (NOTE) Lowest detectable limit for serum alcohol is 10 mg/dL.  For medical purposes only. Performed at [facility], [address] [place], [place] [address]   CBC     Status: Abnormal   Collection Time: 12/10/2021  1:20 PM  Result Value Ref Range   WBC 17.2 (H) 4.0 - 10.5 K/uL   RBC 2.85 (L) 3.87 - 5.11 MIL/uL   Hemoglobin 9.2 (L) 12.0 - 15.0 g/dL   HCT 28.4 (L) 36.0 - 46.0 %   MCV 99.6 80.0 - 100.0 fL   MCH 32.3 26.0 - 34.0 pg   MCHC 32.4  30.0 - 36.0 g/dL   RDW 18.0 (H) 11.5 - 15.5 %   Platelets 280 150 - 400 K/uL   nRBC 0.2 0.0 - 0.2 %    Comment: Performed at [facility], [address] [place], [place] [address]  Differential     Status: Abnormal   Collection Time: 11/18/2021  1:20 PM  Result Value Ref Range   Neutrophils Relative % 81 %   Neutro Abs 14.0 (H) 1.7 - 7.7 K/uL   Lymphocytes Relative 9 %   Lymphs Abs 1.6 0.7 - 4.0 K/uL   Monocytes Relative 4 %   Monocytes Absolute 0.7 0.1 - 1.0 K/uL   Eosinophils Relative 4 %   Eosinophils Absolute 0.6 (H) 0.0 - 0.5 K/uL   Basophils Relative 1 %   Basophils Absolute 0.1 0.0 - 0.1 K/uL   Immature Granulocytes 1 %   Abs Immature Granulocytes 0.13 (H) 0.00 - 0.07 K/uL    Comment: Performed at [facility], [address], [place], [place] [address]  Resp Panel by RT-PCR (Flu A&B, Covid) Nasopharyngeal Swab     Status: Abnormal   Collection Time: 11/30/2021  2:12 PM   Specimen: Nasopharyngeal Swab; Nasopharyngeal(NP) swabs in vial transport medium  Result Value Ref Range   SARS Coronavirus 2 by RT PCR POSITIVE (A) NEGATIVE    Comment: (NOTE) SARS-CoV-2 target nucleic acids are DETECTED.  The SARS-CoV-2 RNA is generally detectable in upper respiratory specimens during the acute phase of infection. Positive results are indicative of the presence of the identified virus, but do not rule out bacterial infection or co-infection with other pathogens not detected by the test. Clinical correlation with patient history and other diagnostic information is necessary to determine patient infection status. The expected result is Negative.  Fact Sheet for Patients: EntrepreneurPulse.com.au  Fact Sheet for Healthcare Providers: IncredibleEmployment.be  This test is not yet approved or cleared by the [place] FDA and  has been authorized for detection and/or diagnosis of SARS-CoV-2 by FDA under an Emergency Use  Authorization (EUA).  This EUA will remain in effect (meaning this test can be used) for the duration of  the COVID-19 declaration under Section 564(b)(1) of the A ct, 21 U.S.C. section 360bbb-3(b)(1), unless the authorization is terminated or revoked sooner.     Influenza A by PCR NEGATIVE NEGATIVE   Influenza B by PCR NEGATIVE NEGATIVE    Comment: (NOTE) The Xpert Xpress SARS-CoV-2/FLU/RSV plus assay is intended as an aid in the diagnosis of influenza from Nasopharyngeal swab specimens and should not be used  as a sole basis for treatment. Nasal washings and aspirates are unacceptable for Xpert Xpress SARS-CoV-2/FLU/RSV testing.  Fact Sheet for Patients: EntrepreneurPulse.com.au  Fact Sheet for Healthcare Providers: IncredibleEmployment.be  This test is not yet approved or cleared by the [place] FDA and has been authorized for detection and/or diagnosis of SARS-CoV-2 by FDA under an Emergency Use Authorization (EUA). This EUA will remain in effect (meaning this test can be used) for the duration of the COVID-19 declaration under Section 564(b)(1) of the Act, 21 U.S.C. section 360bbb-3(b)(1), unless the authorization is terminated or revoked.  Performed at [facility], [address] [place], [place] [address]   CBC with Differential/Platelet     Status: Abnormal   Collection Time: 11/24/2021  2:12 PM  Result Value Ref Range   WBC 16.6 (H) 4.0 - 10.5 K/uL   RBC 3.00 (L) 3.87 - 5.11 MIL/uL   Hemoglobin 9.4 (L) 12.0 - 15.0 g/dL   HCT 29.6 (L) 36.0 - 46.0 %   MCV 98.7 80.0 - 100.0 fL   MCH 31.3 26.0 - 34.0 pg   MCHC 31.8 30.0 - 36.0 g/dL   RDW 17.9 (H) 11.5 - 15.5 %   Platelets 247 150 - 400 K/uL   nRBC 0.2 0.0 - 0.2 %   Neutrophils Relative % 84 %   Neutro Abs 14.0 (H) 1.7 - 7.7 K/uL   Lymphocytes Relative 8 %   Lymphs Abs 1.3 0.7 - 4.0 K/uL   Monocytes Relative 4 %   Monocytes Absolute 0.6 0.1 - 1.0 K/uL    Eosinophils Relative 3 %   Eosinophils Absolute 0.5 0.0 - 0.5 K/uL   Basophils Relative 0 %   Basophils Absolute 0.1 0.0 - 0.1 K/uL   Immature Granulocytes 1 %   Abs Immature Granulocytes 0.10 (H) 0.00 - 0.07 K/uL    Comment: Performed at [facility], [address] [place], [place] [address]  Comprehensive metabolic panel     Status: Abnormal   Collection Time: 12/09/2021  2:12 PM  Result Value Ref Range   Sodium 140 135 - 145 mmol/L    Comment: ELECTROLYTES REPEATED TO VERIFY MJU   Potassium 3.8 3.5 - 5.1 mmol/L   Chloride 94 (L) 98 - 111 mmol/L   CO2 20 (L) 22 - 32 mmol/L   Glucose, Bld 122 (H) 70 - 99 mg/dL    Comment: Glucose reference range applies only to samples taken after fasting for at least 8 hours.   BUN 65 (H) 6 - 20 mg/dL   Creatinine, Ser 11.34 (H) 0.44 - 1.00 mg/dL   Calcium 8.3 (L) 8.9 - 10.3 mg/dL   Total Protein 7.1 6.5 - 8.1 g/dL   Albumin 2.8 (L) 3.5 - 5.0 g/dL   AST 11 (L) 15 - 41 U/L   ALT 7 0 - 44 U/L   Alkaline Phosphatase 131 (H) 38 - 126 U/L   Total Bilirubin 1.0 0.3 - 1.2 mg/dL   GFR, Estimated 4 (L) >60 mL/min    Comment: (NOTE) Calculated using the CKD-EPI Creatinine Equation (2021)    Anion gap 26 (H) 5 - 15    Comment: Performed at [facility], [address] [place], [place] [address]  C-reactive protein     Status: Abnormal   Collection Time: 11/19/2021  6:30 PM  Result Value Ref Range   CRP 5.9 (H) <1.0 mg/dL    Comment: Performed at [facility], [place] [address] [place], [place] [address]  Hemoglobin A1c     Status: None   Collection Time: 11/30/2021  6:30 PM  Result Value Ref Range   Hgb A1c MFr Bld 5.1 4.8 - 5.6 %    Comment: (NOTE) Pre diabetes:          5.7%-6.4%  Diabetes:              >6.4%  Glycemic control for   <7.0% adults with diabetes    Mean Plasma Glucose 99.67 mg/dL    Comment: Performed at [facility] [address], [place], [place] [address]  Lipid panel     Status: Abnormal    Collection Time: 11/27/2021  6:30 PM  Result Value Ref Range   Cholesterol 125 0 - 200 mg/dL   Triglycerides 73 <150 mg/dL   HDL 34 (L) >40 mg/dL   Total CHOL/HDL Ratio 3.7 RATIO   VLDL 15 0 - 40 mg/dL   LDL Cholesterol 76 0 - 99 mg/dL    Comment:        Total Cholesterol/HDL:CHD Risk Coronary Heart Disease Risk Table                     Men   Women  1/2 Average Risk   3.4   3.3  Average Risk       5.0   4.4  2 X Average Risk   9.6   7.1  3 X Average Risk  23.4   11.0        Use the calculated Patient Ratio above and the CHD Risk Table to determine the patient's CHD Risk.        ATP III CLASSIFICATION (LDL):  <100     mg/dL   Optimal  100-129  mg/dL   Near or Above                    Optimal  130-159  mg/dL   Borderline  160-189  mg/dL   High  >190     mg/dL   Very High Performed at [facility], [address] [place], [place] [address]   Glucose, capillary     Status: None   Collection Time: 11/25/2021  9:18 PM  Result Value Ref Range   Glucose-Capillary 72 70 - 99 mg/dL    Comment: Glucose reference range applies only to samples taken after fasting for at least 8 hours.  APTT     Status: Abnormal   Collection Time: 12/12/2021 10:22 PM  Result Value Ref Range   aPTT 38 (H) 24 - 36 seconds    Comment:        IF BASELINE aPTT IS ELEVATED, SUGGEST PATIENT RISK ASSESSMENT BE USED TO DETERMINE APPROPRIATE ANTICOAGULANT THERAPY. Performed at [facility], [address] [place], [place] [address]   Protime-INR     Status: Abnormal   Collection Time: 11/21/2021 10:22 PM  Result Value Ref Range   Prothrombin Time 16.3 (H) 11.4 - 15.2 seconds   INR 1.3 (H) 0.8 - 1.2    Comment: (NOTE) INR goal varies based on device and disease states. Performed at [facility], [address] [place],  [address]   Heparin level (unfractionated)     Status: Abnormal   Collection Time: 12/08/2021 10:22 PM  Result Value Ref Range   Heparin Unfractionated  <0.10 (L) 0.30 - 0.70 IU/mL    Comment: Performed at [facility], [address], [place],  [address]  D-dimer, quantitative     Status: Abnormal   Collection Time: 12/11/2021 10:22 PM  Result Value Ref Range   D-Dimer, Quant 0.72 (H) 0.00 - 0.50 ug/mL-FEU    Comment: (NOTE) At the manufacturer cut-off value of 0.5 g/mL FEU, this assay has a negative predictive value of 95-100%.This assay is intended for use in conjunction with a clinical pretest probability (PTP) assessment model to exclude pulmonary embolism (PE) and deep venous thrombosis (DVT) in outpatients suspected of PE or DVT. Results should be correlated with clinical presentation. Performed at [facility], [address] [place], [place] [address]   Glucose, capillary     Status: Abnormal   Collection Time: 11/27/21 12:04 AM  Result Value Ref Range   Glucose-Capillary 54 (L) 70 - 99 mg/dL    Comment: Glucose reference range applies only to samples taken after fasting for at least 8 hours.  Glucose, capillary     Status: Abnormal   Collection Time: 11/27/21 12:53 AM  Result Value Ref Range   Glucose-Capillary 127 (H) 70 - 99 mg/dL    Comment: Glucose reference range applies only to samples taken after fasting for at least 8 hours.  CBC with Differential/Platelet     Status: Abnormal   Collection Time: 11/27/21  5:28 AM  Result Value Ref Range   WBC 14.4 (H) 4.0 - 10.5 K/uL   RBC 2.73 (L) 3.87 - 5.11 MIL/uL   Hemoglobin 8.6 (L) 12.0 - 15.0 g/dL   HCT 27.4 (L) 36.0 - 46.0 %   MCV 100.4 (H) 80.0 - 100.0 fL   MCH 31.5 26.0 - 34.0 pg   MCHC 31.4 30.0 - 36.0 g/dL   RDW 17.9 (H) 11.5 - 15.5 %   Platelets 242 150 - 400 K/uL   nRBC 0.1 0.0 - 0.2 %   Neutrophils Relative % 81 %   Neutro Abs 11.8 (H) 1.7 - 7.7 K/uL   Lymphocytes Relative 12 %   Lymphs Abs 1.8 0.7 - 4.0 K/uL   Monocytes Relative 4 %   Monocytes Absolute 0.5 0.1 - 1.0 K/uL   Eosinophils Relative 2 %   Eosinophils Absolute 0.2  0.0 - 0.5 K/uL   Basophils Relative 0 %   Basophils Absolute 0.0 0.0 - 0.1 K/uL   Immature Granulocytes 1 %   Abs Immature Granulocytes 0.10 (H) 0.00 - 0.07 K/uL    Comment: Performed at [facility], [address] [place], [place] [address]  Comprehensive metabolic panel     Status: Abnormal   Collection Time: 11/27/21  5:28 AM  Result Value Ref Range   Sodium 138 135 - 145 mmol/L    Comment: ELECTROLYTES REPEATED TO VERIFY   SB   Potassium 4.1 3.5 - 5.1 mmol/L   Chloride 94 (L) 98 - 111 mmol/L   CO2 17 (L) 22 - 32 mmol/L   Glucose, Bld 98 70 - 99 mg/dL    Comment: Glucose reference range applies only to samples taken after fasting for at least 8 hours.   BUN 67 (H) 6 - 20 mg/dL   Creatinine, Ser 12.01 (H) 0.44 - 1.00 mg/dL   Calcium 8.2 (L) 8.9 - 10.3 mg/dL   Total Protein 6.6 6.5 - 8.1 g/dL   Albumin 2.7 (L) 3.5 - 5.0 g/dL   AST 11 (L) 15 - 41 U/L   ALT 6 0 - 44 U/L   Alkaline Phosphatase 124 38 - 126 U/L   Total Bilirubin 0.7 0.3 - 1.2 mg/dL  GFR, Estimated 4 (L) >60 mL/min    Comment: (NOTE) Calculated using the CKD-EPI Creatinine Equation (2021)    Anion gap 27 (H) 5 - 15    Comment: Performed at [facility], [address] [place], [place] [address]  D-dimer, quantitative     Status: Abnormal   Collection Time: 11/27/21  5:28 AM  Result Value Ref Range   D-Dimer, Quant 0.61 (H) 0.00 - 0.50 ug/mL-FEU    Comment: (NOTE) At the manufacturer cut-off value of 0.5 g/mL FEU, this assay has a negative predictive value of 95-100%.This assay is intended for use in conjunction with a clinical pretest probability (PTP) assessment model to exclude pulmonary embolism (PE) and deep venous thrombosis (DVT) in outpatients suspected of PE or DVT. Results should be correlated with clinical presentation. Performed at [facility], [address] [place], [place] [address]   Glucose, capillary     Status: None   Collection Time: 11/27/21  6:53 AM   Result Value Ref Range   Glucose-Capillary 96 70 - 99 mg/dL    Comment: Glucose reference range applies only to samples taken after fasting for at least 8 hours.  Glucose, capillary     Status: None   Collection Time: 11/27/21  6:55 AM  Result Value Ref Range   Glucose-Capillary 85 70 - 99 mg/dL    Comment: Glucose reference range applies only to samples taken after fasting for at least 8 hours.  Heparin level (unfractionated)     Status: Abnormal   Collection Time: 11/27/21  9:06 AM  Result Value Ref Range   Heparin Unfractionated 0.12 (L) 0.30 - 0.70 IU/mL    Comment: (NOTE) The clinical reportable range upper limit is being lowered to >1.10 to align with the FDA approved guidance for the current laboratory assay.  If heparin results are below expected values, and patient dosage has  been confirmed, suggest follow up testing of antithrombin III levels. Performed at [facility], [address] [place], [place] [address]     MR BRAIN WO CONTRAST  Result Date: 12/06/2021 CLINICAL DATA:  Provided history: Neuro deficit, acute, stroke suspected. EXAM: MRI HEAD WITHOUT CONTRAST TECHNIQUE: Multiplanar, multiecho pulse sequences of the brain and surrounding structures were obtained without intravenous contrast. COMPARISON:  Noncontrast head CT performed immediately prior 11/29/2021. FINDINGS: Brain: An abbreviated protocol examination was performed at the provider's request. The following sequences were acquired: Axial and coronal diffusion-weighted imaging, axial T2 FLAIR sequence and axial SWI sequence. The axial T2 FLAIR sequence is moderately motion degraded, limiting evaluation. Subcentimeter acute cortically-based infarct within the posterior left frontal lobe (series 5, image 35). Subcentimeter acute cortically-based infarct within the lateral right frontal lobe (at the anterior aspect of the right sylvian fissure)(series 5, image 28). Patchy small acute infarcts within the  callosal splenium on the right. 12 mm focus of diffusion-weighted hyperintensity within the mid left frontal lobe white matter (series 5, image 32). No definite ADC correlate is appreciated. However, this finding is present on both the axial and coronal diffusion-weighted sequences, and this may reflect a recent infarct. Similarly, there is an ill-defined focus of diffusion-weighted hyperintensity within the left frontoparietal periventricular white matter measuring 10 mm (series 5, image 31). No definite ADC correlate is appreciated. However, this finding is present on both the axial and coronal diffusion-weighted sequences, and this may reflect a recent infarct. Small chronic cortical/subcortical infarct within the right parietooccipital lobes. Chronic lacunar infarct within the left caudate nucleus with associated chronic  hemosiderin deposition at this site. Multifocal T2 FLAIR hyperintense signal abnormality within the cerebral white matter, nonspecific but compatible with chronic small vessel ischemic disease. These signal changes are overall mild, but advanced for age. No evidence of an intracranial mass or extra-axial fluid collection. No midline shift. Vascular: Flow voids poorly assessed in the absence of T2 TSE imaging. Skull and upper cervical spine: 1.9 cm bony protuberance projecting outward from the right frontal calvarium, likely reflecting an osteoma. Sinuses/Orbits: No acute orbital finding. Mild mucosal thickening within the bilateral ethmoid sinuses. Other: Trapped fluid within the left petrous apex. Impression #2 called by telephone at the time of interpretation on 11/21/2021 at 2:05 pm to provider [facility] , who verbally acknowledged these results. IMPRESSION: 1. Abbreviated protocol motion degraded examination, as described. 2. Subcentimeter acute cortically-based infarcts within the lateral right frontal lobe and posterior left frontal lobe. Patchy small acute infarcts within the callosal  splenium on the right. Additional small recent infarcts (measuring up to 12 mm) are questioned within the left frontoparietal white matter. Given small infarcts involving multiple vascular territories, there is suspicion for an embolic process. 3. Small chronic cortical/subcortical infarct within the right parietooccipital lobes. 4. Chronic hemorrhagic lacunar infarct within the left caudate nucleus. 5. Background chronic small-vessel ischemic changes within the cerebral white matter, mild but advanced for age. 6. 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 7. Mild mucosal thickening within the bilateral ethmoid sinuses. 8. Trapped fluid within the left petrous apex. Electronically Signed   By: Kellie Simmering D.O.   On: 11/30/2021 14:31   DG Chest Port 1 View  Result Date: 11/30/2021 CLINICAL DATA:  Weakness EXAM: PORTABLE CHEST 1 VIEW COMPARISON:  07/07/2021 FINDINGS: Stable cardiomegaly. Mild pulmonary vascular congestion. No overt pulmonary edema. No focal airspace consolidation. No pleural effusion or pneumothorax. IMPRESSION: Cardiomegaly with mild pulmonary vascular congestion. Electronically Signed   By: Davina Poke D.O.   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 There is new bony irregularity along the resection margins of the third, fourth, and fifth metatarsals. No acute fracture. No dislocation. No deep soft tissue ulceration is evident radiographically. Severe small vessel atherosclerotic calcifications. 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 New bony irregularity along the resection margins of the third, fourth, and fifth metatarsals concerning for osteomyelitis. Electronically Signed   By: Davina Poke D.O.   On:  12/07/2021 18:37   CT HEAD CODE STROKE WO CONTRAST  Result Date: 11/30/2021 CLINICAL DATA:  Code stroke. Provided history: Neuro deficit, acute, stroke suspected. Additional history provided: Aphasia, left-sided weakness. Last known normal 11:30 a.m. EXAM: CT HEAD WITHOUT CONTRAST TECHNIQUE: Contiguous axial images were obtained from the base of the skull through the vertex without intravenous contrast. RADIATION DOSE REDUCTION: This exam was performed according to the departmental dose-optimization program which includes automated exposure control, adjustment of the mA and/or kV according to patient size and/or use of iterative reconstruction technique. COMPARISON:  Report from head CT 02/21/2002 (images unavailable). FINDINGS: Brain: Mild generalized cerebral atrophy, advanced for age. Small chronic cortical/subcortical infarct within the right parietooccipital lobes. Patchy and ill-defined hypoattenuation within the cerebral white matter, nonspecific but compatible with chronic small vessel ischemic disease. Findings are overall mild, but advanced for age. Small age-indeterminate lacunar infarct within  the anterior limb of right internal capsule (series 3, image 18) (series 5, image 32). 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 Scattered supratentorial calcifications, some many of which appear parenchymal. There is no acute intracranial hemorrhage. No acute demarcated cortical infarct is identified. No extra-axial fluid collection. No evidence of an intracranial mass. No midline shift. Vascular: No hyperdense vessel. Atherosclerotic calcifications. Skull: No calvarial fracture. Bony protuberance projecting outward from the right frontal calvarium, measuring 1.9 x 1.1 cm in transaxial dimensions, compatible with an osteoma (series 4, image 42). Sinuses/Orbits: Visualized orbits show no acute finding. No significant paranasal sinus disease. ASPECTS ([place] Stroke Program Early CT Score) -  Ganglionic level infarction (caudate, lentiform nuclei, internal capsule, insula, M1-M3 cortex): 6 - Supraganglionic infarction (M4-M6 cortex): 3 Total score (0-10 with 10 being normal): 9 Impressions #1 and #2 were communicated to Dr. Rory Percy at 1:50 pmon 12/14/2021 by text page via the [facility] messaging system. IMPRESSION: 1. No acute intracranial hemorrhage or acute demarcated cortical infarct. 2. Age-indeterminate lacunar infarct within the anterior limb of right internal capsule. 3. Small chronic cortical/subcortical infarct within the right parietooccipital lobes. 4. 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 5. Chronic small-vessel ischemic changes within the cerebral white matter, overall mild but advanced for age. 6. Scattered punctate supratentorial parenchymal calcifications. Findings are nonspecific, but may be seen as sequela of neurocysticercosis. 7. Mild cerebral atrophy, advanced for age. 8. 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 Electronically Signed   By: Kellie Simmering D.O.   On: 12/13/2021 13:51    Review of Systems  Reason unable to perform ROS: Encephalopathic.  Blood pressure (!) 74/40, pulse (!) 107, temperature 97.6 F (36.4 C), temperature source Axillary, resp. rate 19, height 5\' 6"  (1.676 m), weight 131.1 kg, SpO2 100 %. Physical Exam Vitals and nursing note reviewed.  Constitutional:      Comments: .  Difficult to arouse, encephalopathic  Cardiovascular:     Rate and Rhythm: Tachycardia present.  Pulmonary:     Breath sounds: Wheezing present.  Skin:    General: Skin is warm and dry.     Comments: Violaceous right forefoot cooler than the rest of the right leg.  Neurological:     Comments: Not oriented    Assessment/Plan: 1.  Ischemic right lower extremity in the setting of acute stroke with inability to perform lysis: She is at high risk for limb loss given that she presents with a constellation of morbidities that precludes  aggressive treatment of the right lower extremity, coupled with the fact that her runoff to the pedal level on the right is poor as seen previously.  Continue heparin drip currently. Further neurologic evaluation underway, but again is currently not a candidate for aggressive vascular intervention.  Continue heparin drip.  Will discuss with neurology  [name] 11/27/2021, 10:39 AM

## 2021-11-27 NOTE — Progress Notes (Signed)
Patient admitted to room 245 about 2100. Patient's mother Francesca Jewett was present at bedside. Patient is positive for covid-19 and isolation precautions initiated. Patient is on 2L O2 via [place]. Patient and mom Oriented to unit and staff. Call bell within reach.

## 2021-11-28 ENCOUNTER — Inpatient Hospital Stay: Payer: Medicare Other

## 2021-11-28 DIAGNOSIS — N186 End stage renal disease: Secondary | ICD-10-CM

## 2021-11-28 DIAGNOSIS — M869 Osteomyelitis, unspecified: Secondary | ICD-10-CM

## 2021-11-28 DIAGNOSIS — I350 Nonrheumatic aortic (valve) stenosis: Secondary | ICD-10-CM

## 2021-11-28 DIAGNOSIS — E872 Acidosis, unspecified: Secondary | ICD-10-CM

## 2021-11-28 DIAGNOSIS — G9341 Metabolic encephalopathy: Secondary | ICD-10-CM

## 2021-11-28 DIAGNOSIS — I5032 Chronic diastolic (congestive) heart failure: Secondary | ICD-10-CM | POA: Diagnosis not present

## 2021-11-28 DIAGNOSIS — U071 COVID-19: Secondary | ICD-10-CM

## 2021-11-28 DIAGNOSIS — I6340 Cerebral infarction due to embolism of unspecified cerebral artery: Principal | ICD-10-CM

## 2021-11-28 DIAGNOSIS — I639 Cerebral infarction, unspecified: Secondary | ICD-10-CM | POA: Diagnosis not present

## 2021-11-28 DIAGNOSIS — D631 Anemia in chronic kidney disease: Secondary | ICD-10-CM | POA: Diagnosis not present

## 2021-11-28 LAB — CBC
HCT: 27.4 % — ABNORMAL LOW (ref 36.0–46.0)
Hemoglobin: 8.6 g/dL — ABNORMAL LOW (ref 12.0–15.0)
MCH: 31.5 pg (ref 26.0–34.0)
MCHC: 31.4 g/dL (ref 30.0–36.0)
MCV: 100.4 fL — ABNORMAL HIGH (ref 80.0–100.0)
Platelets: 253 10*3/uL (ref 150–400)
RBC: 2.73 MIL/uL — ABNORMAL LOW (ref 3.87–5.11)
RDW: 18 % — ABNORMAL HIGH (ref 11.5–15.5)
WBC: 17.4 10*3/uL — ABNORMAL HIGH (ref 4.0–10.5)
nRBC: 0.4 % — ABNORMAL HIGH (ref 0.0–0.2)

## 2021-11-28 LAB — HEPARIN LEVEL (UNFRACTIONATED)
Heparin Unfractionated: 0.44 IU/mL (ref 0.30–0.70)
Heparin Unfractionated: 0.29 IU/mL — ABNORMAL LOW (ref 0.30–0.70)

## 2021-11-28 LAB — COMPREHENSIVE METABOLIC PANEL
ALT: 6 U/L (ref 0–44)
AST: 11 U/L — ABNORMAL LOW (ref 15–41)
Albumin: 2.8 g/dL — ABNORMAL LOW (ref 3.5–5.0)
Alkaline Phosphatase: 120 U/L (ref 38–126)
Anion gap: 25 — ABNORMAL HIGH (ref 5–15)
BUN: 67 mg/dL — ABNORMAL HIGH (ref 6–20)
CO2: 18 mmol/L — ABNORMAL LOW (ref 22–32)
Calcium: 7.9 mg/dL — ABNORMAL LOW (ref 8.9–10.3)
Chloride: 93 mmol/L — ABNORMAL LOW (ref 98–111)
Creatinine, Ser: 11.5 mg/dL — ABNORMAL HIGH (ref 0.44–1.00)
GFR, Estimated: 4 mL/min — ABNORMAL LOW (ref >60)
Glucose, Bld: 108 mg/dL — ABNORMAL HIGH (ref 70–99)
Potassium: 4 mmol/L (ref 3.5–5.1)
Sodium: 136 mmol/L (ref 135–145)
Total Bilirubin: 1 mg/dL (ref 0.3–1.2)
Total Protein: 6.5 g/dL (ref 6.5–8.1)

## 2021-11-28 LAB — GLUCOSE, CAPILLARY
Glucose-Capillary: 108 mg/dL — ABNORMAL HIGH (ref 70–99)
Glucose-Capillary: 122 mg/dL — ABNORMAL HIGH (ref 70–99)
Glucose-Capillary: 88 mg/dL (ref 70–99)

## 2021-11-28 LAB — ANTITHROMBIN III: AntiThromb III Func: 67 % — ABNORMAL LOW (ref 75–120)

## 2021-11-28 LAB — MAGNESIUM: Magnesium: 1.5 mg/dL — ABNORMAL LOW (ref 1.7–2.4)

## 2021-11-28 LAB — PHOSPHORUS: Phosphorus: 10 mg/dL — ABNORMAL HIGH (ref 2.5–4.6)

## 2021-11-28 IMAGING — US US EXTREM LOW VENOUS
1 series · 13 of 24 positions shown · non-contrast
Comparison: None.

CLINICAL DATA: Acute ischemic stroke, bilateral lower extremity
swelling. Assess for DVT.



[Series 1: us venous img lower bilat (dvt) · portal-venous · 13 of 59 slices shown]
[im 1/59]
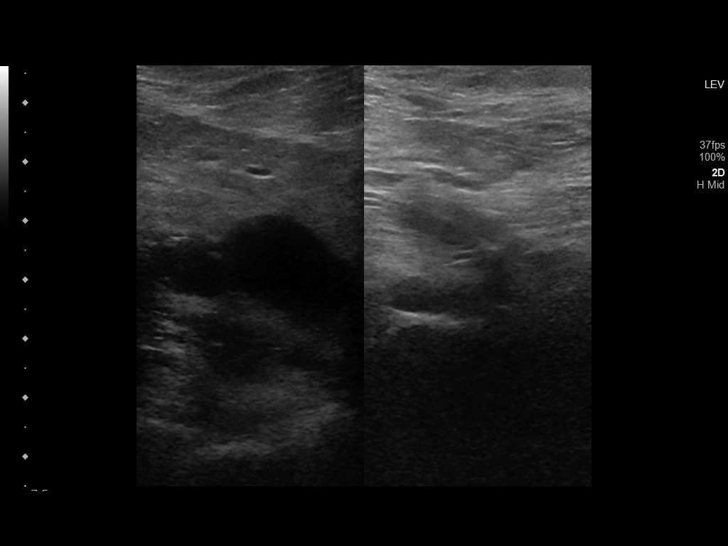
[im 6/59]
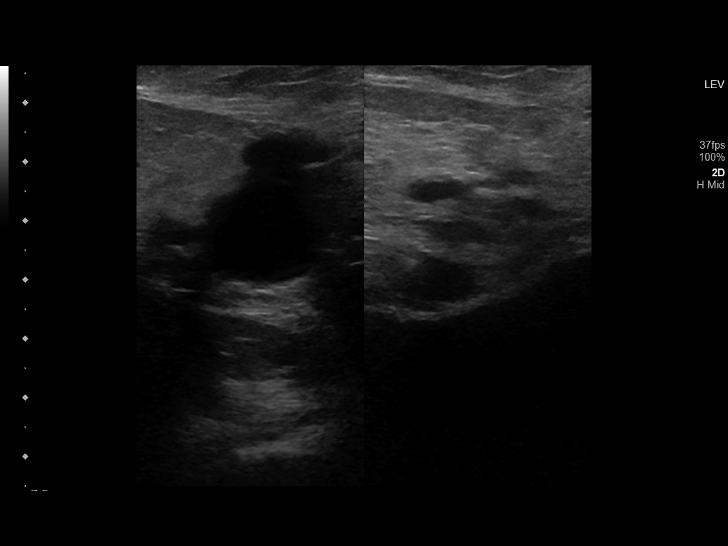
[im 11/59]
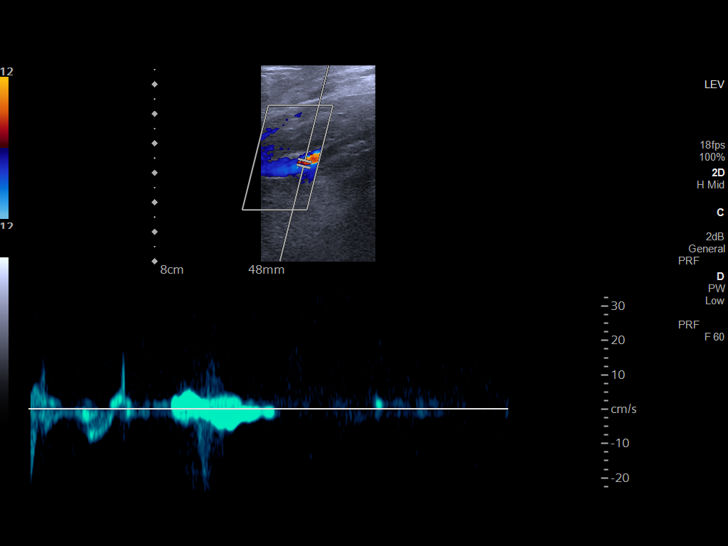
[im 16/59]
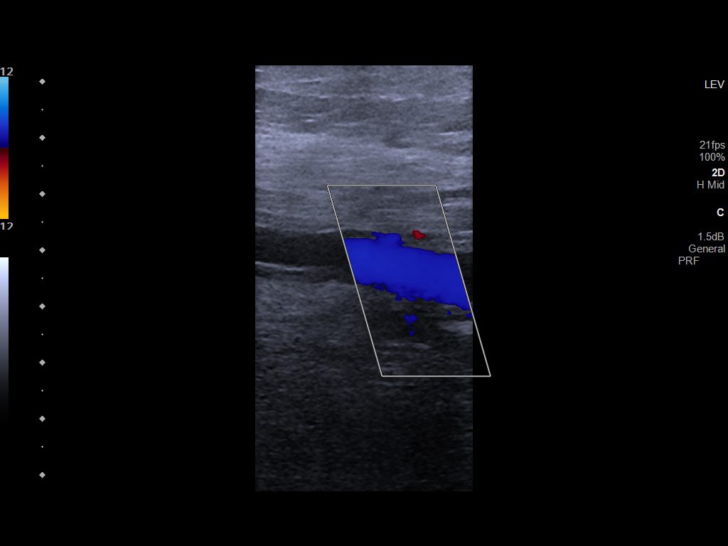
[im 21/59]
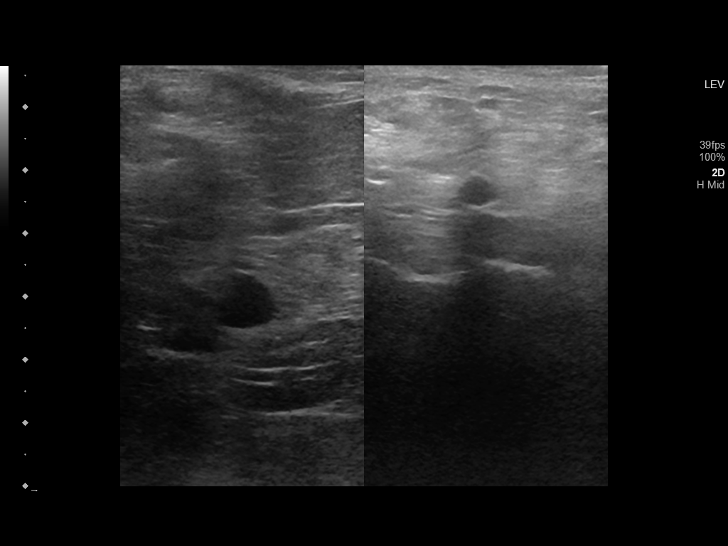
[im 26/59]
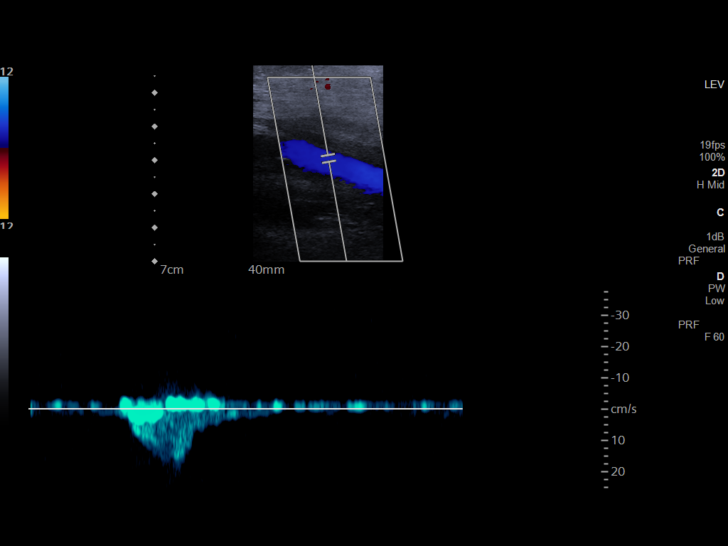
[im 31/59]
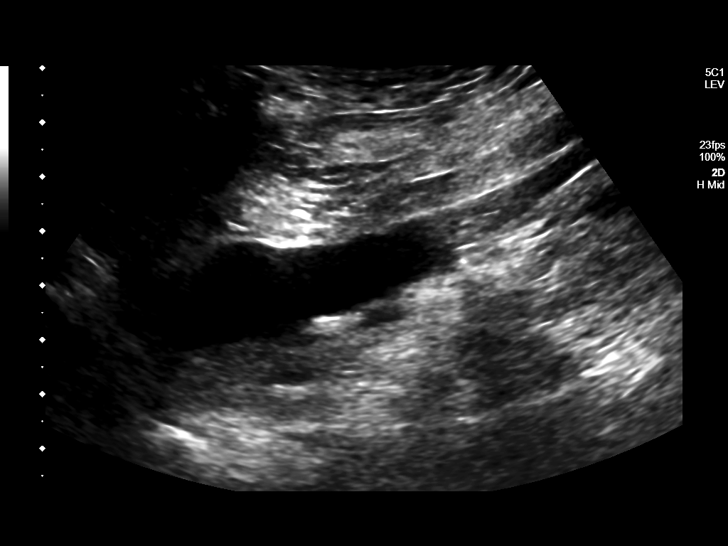
[im 33/59]
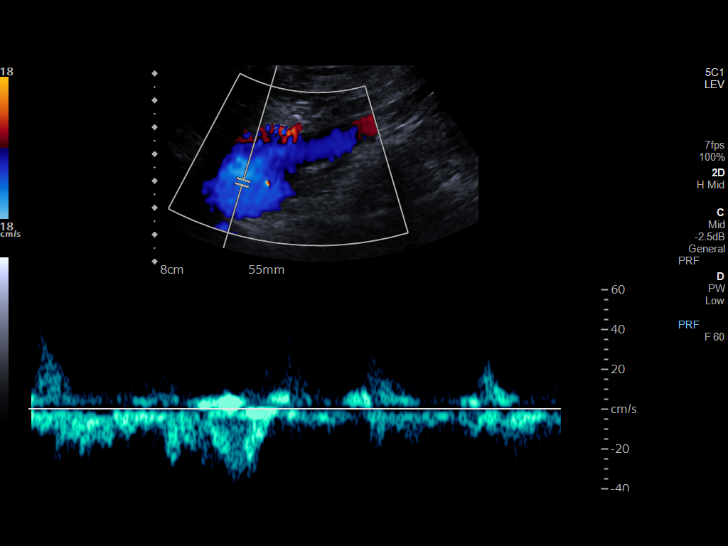
[im 38/59]
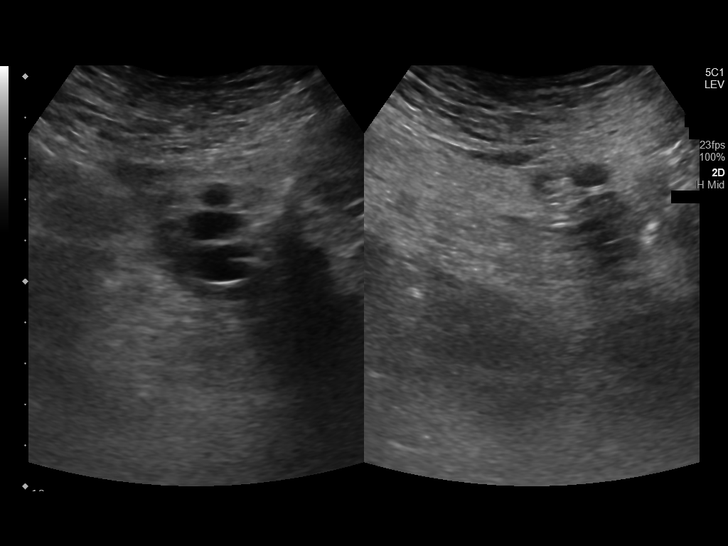
[im 43/59]
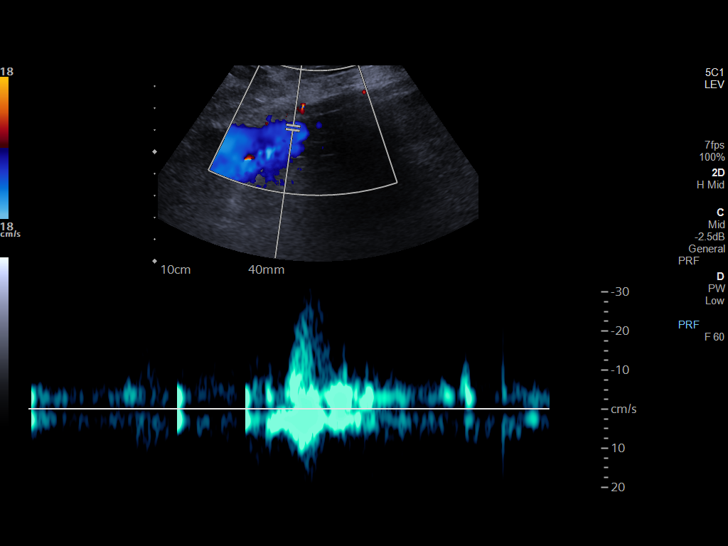
[im 48/59]
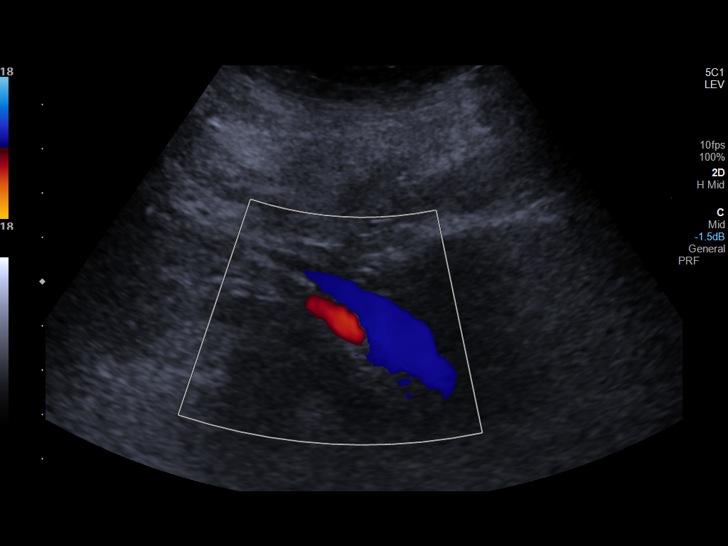
[im 53/59]
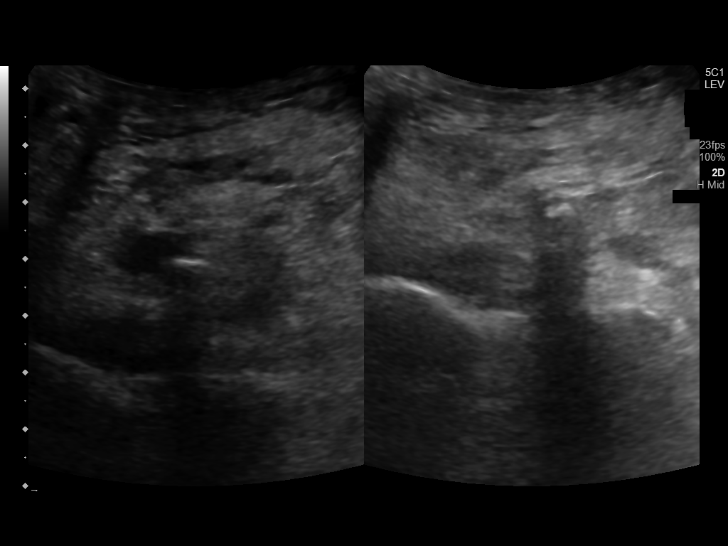
[im 59/59]
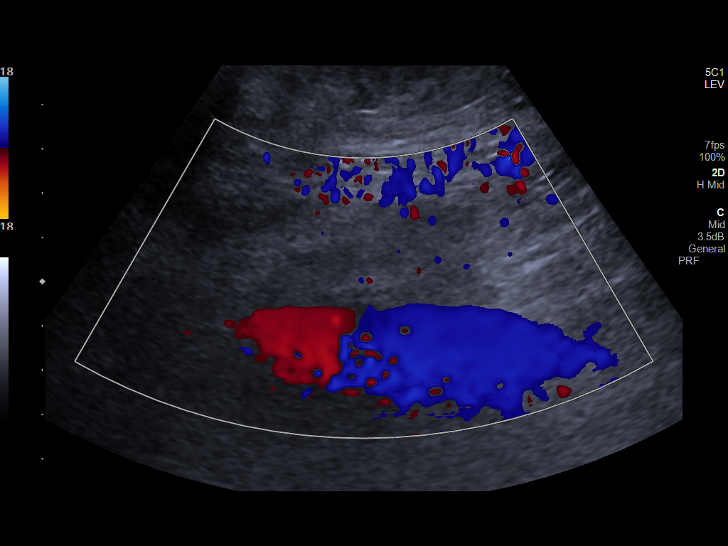

[13 of 24 positions shown; findings below may reference images not displayed]

FINDINGS: RIGHT LOWER EXTREMITY

Common Femoral Vein: No evidence of thrombus. Normal
compressibility, respiratory phasicity and response to augmentation.

Saphenofemoral Junction: No evidence of thrombus. Normal
compressibility and flow on color Doppler imaging.

Profunda Femoral Vein: No evidence of thrombus. Normal
compressibility and flow on color Doppler imaging.

Femoral Vein: No evidence of thrombus. Normal compressibility,
respiratory phasicity and response to augmentation.

Popliteal Vein: No evidence of thrombus. Normal compressibility,
respiratory phasicity and response to augmentation.

Calf Veins: Limited visualization of the calf veins. No obvious
thrombus.

Superficial Great Saphenous Vein: No evidence of thrombus. Normal
compressibility.

Venous Reflux:  None.

Other Findings:  None.

LEFT LOWER EXTREMITY

Common Femoral Vein: No evidence of thrombus. Normal
compressibility, respiratory phasicity and response to augmentation.

Saphenofemoral Junction: No evidence of thrombus. Normal
compressibility and flow on color Doppler imaging.

Profunda Femoral Vein: No evidence of thrombus. Normal
compressibility and flow on color Doppler imaging.

Femoral Vein: No evidence of thrombus. Normal compressibility,
respiratory phasicity and response to augmentation.

Popliteal Vein: No evidence of thrombus. Normal compressibility,
respiratory phasicity and response to augmentation.

Calf Veins: Limited visualization of the calf veins. No obvious
thrombus.

Superficial Great Saphenous Vein: No evidence of thrombus. Normal
compressibility.

Venous Reflux:  None.

Other Findings:  None.
IMPRESSION: No evidence of deep venous thrombosis in either lower extremity to
the level of the knees. Calf veins are not well seen due to patient
body habitus and subcutaneous edema.

## 2021-11-28 MED ORDER — MAGNESIUM SULFATE 2 GM/50ML IV SOLN
2.0000 g | Freq: Once | INTRAVENOUS | Status: AC
  Administered 2021-11-28: 2 g via INTRAVENOUS
  Filled 2021-11-28: qty 50

## 2021-11-28 MED ORDER — PANTOPRAZOLE SODIUM 40 MG IV SOLR
40.0000 mg | INTRAVENOUS | Status: DC
  Administered 2021-11-28 – 2021-12-04 (×7): 40 mg via INTRAVENOUS
  Filled 2021-11-28 (×7): qty 10

## 2021-11-28 MED ORDER — HALOPERIDOL LACTATE 5 MG/ML IJ SOLN
5.0000 mg | Freq: Once | INTRAMUSCULAR | Status: AC
  Administered 2021-11-28: 5 mg via INTRAVENOUS
  Filled 2021-11-28: qty 1

## 2021-11-28 NOTE — Progress Notes (Signed)
Patient was anxious and agitated second half of the shift, attempting to get out of the bed, pull oxygen off, pulling lines and hitting at staff.  Dr. Loleta Books notified and ordered Haldol.  Patient is too agitated for MRA, radiology notified and Dr. Rory Percy.  Will continue to monitor patient.

## 2021-11-28 NOTE — Progress Notes (Signed)
Patient tolerated PD treatment, no fibrin seen in fluid, exit site care performed. No indication from patient of discomfort. Disconnect completed.

## 2021-11-28 NOTE — Assessment & Plan Note (Signed)
-   Peritoneal dialysis - Resume Phos binders when able to take PO

## 2021-11-28 NOTE — Progress Notes (Signed)
Pt was sleeping. Pt received haldol as per her primary nurse because pt was agitated.  19:12 PD initiated. Catheter intact. No fibrin seen on initial drain. Cycler enter into first fill without issue.

## 2021-11-28 NOTE — Progress Notes (Signed)
Progress Note   Patient: [name] WPY:[id number] DOB: 1986-04-10 DOA: 11/17/2021     2 DOS: the patient was seen and examined on 11/28/2021       Brief hospital course: [name] is a 36 y.o. 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  Evidently, she was found by family with left arm drift and nonverbal.  Arrived to ER as CODE STROKE, evaluated by Neurology and found to have established stroke, tPA withheld.  Also noted to have decreased pulse in right foot, as well as tingling.  Vascular surgery consulted, felt this was ischemic, started heparin.  Also incidentally COVID+   2/11: Persistently encephalopathy, concern for worsening R hemiparesis --> repeat CT head unchanged --> hypoxic event during CT head --> transfer to [facility] 2/12: Arm BP readings erratic and unreliable --> Art line placed; echo showing MV thickening       Assessment and Plan: * Acute CVA (cerebrovascular accident) ([place])- (present on admission) -Non-invasive angiography with MRA head pending -Echocardiogram shows MV thickening ?vegetation --> TEE planned -Carotid imaging pending - LE [place] pending -Lipids ordered: LDL 76 continue atorvastatin when able to take p.o. -Aspirin suppository - SLP eval    Acute metabolic encephalopathy Family describe recent baseline was that patient was weaker, more often in Wheelchair, speaking less.  Acutely worse now, aphasic with waxing and waning agitation and difficulty to redirect and neglect  Ischemic foot- (present on admission) Pulse diminished in foot.  Suspect central source embolism.    Stroke precludes operative management of thrombosis in leg at this time - Consult Vascular surgery -  Continue heparin gtt -Continue cefepime  COVID-19 virus infection This was an incidental finding.  No significant airways disease -Continue remdesivir, heparin  ESRD (end stage renal disease) on dialysis [facility]) - Consult Nephrology for peritoneal HD  Hyperphosphatemia - Peritoneal dialysis - Resume Phos binders when able to take PO  Atherosclerosis of native arteries of the extremities with ulceration ([place])- (present on admission) - Continue heparin - Continue aspirin suppository - Resume statin when able to take PO  Essential hypertension- (present on admission) BP 120s/80s with art line today. -Hold amlodipine, losartan, Coreg  Obesity, Class III, BMI 40-49.9 (morbid obesity) ([place])- (present on admission) BMI 46  Chronic diastolic CHF (congestive heart failure) ([place])- (present on admission) No obvious peripheral edema. - Fluid balance through HD  Iron deficiency anemia- (present on admission) Hgb no change from baseline  Anemia in chronic kidney disease- (present on admission) Stable relative to baseline, no clinical bleeding           Subjective: Currently agitated, aphasic.  No fever, no respiratory distress      Physical Exam: Vitals:   11/28/21 1200 11/28/21 1300 11/28/21 1400 11/28/21 1500  BP:      Pulse: 93   94  Resp: (!) 23 17 (!) 25 (!) 23  Temp: 97.9 F (36.6 C)     TempSrc: Oral     SpO2: 100% 100% 95% 100%  Weight:      Height:       Obese adult female, appears chronically ill, older than stated age, lying in bed, very sluggish. Tachycardic, regular, I do not appreciate a murmur, JVP not visible due to  body habitus, no peripheral edema Lung sounds diminished due to body habitus, do not appreciate rales or wheezes She seems to have relatively symmetric spontaneous movements of upper and lower extremities although she does not follow commands. No pain or rigidity to palpation of the abdomen, no grimace.       Data  Reviewed: Discussed with neurology, vascular surgery, nephrology, critical care, cardiology Labs and imaging studies are notable for low magnesium, increased phosphate, mild acidosis, white blood cell count going up, echocardiogram showing mitral valve     Family Communication: Mother by phone  Disposition: Status is: Inpatient Remains inpatient appropriate because: She requires ongoing IV heparin, IV antibiotics, and work-up for stroke, TEE          Planned Discharge Destination: Skilled nursing facility    The patient is critically ill with multi-organ failure.  Critical care was necessary to treat or prevent imminent or life-threatening deterioration of sepsis, respiratory failure, cardiac failure, and was exclusive of separately billable procedures and treating other patients. Total critical care time spent by me: 65 minutes Time spent personally by me on obtaining history from patient or surrogate, evaluation of the patient, evaluation of patient's response to treatment, ordering and review of laboratory studies, ordering and review of radiographic studies, ordering and performing treatments and interventions, and re-evaluation of the patient's condition.   Author: Edwin Dada, MD 11/28/2021 4:04 PM  For on call review [email or web address].

## 2021-11-28 NOTE — Consult Note (Signed)
[place] for heparin Indication:  Thrombus  Allergies  Allergen Reactions   Percocet [Oxycodone-Acetaminophen] Itching   Tomato Itching   Gabapentin Other (See Comments)    Extremity tremors    Patient Measurements: Height: 5\' 6"  (167.6 cm) Weight: 131.5 kg (289 lb 14.5 oz) IBW/kg (Calculated) : 59.3 Heparin Dosing Weight: 92.3 Kg  Vital Signs: Temp: 97.9 F (36.6 C) (02/12 1200) Temp Source: Oral (02/12 1200) BP: 127/103 (02/12 0800) Pulse Rate: 94 (02/12 1500)  Labs: Recent Labs    12/08/2021 1412 11/23/2021 2222 11/27/21 0528 11/27/21 0906 11/27/21 2048 11/28/21 0602 11/28/21 1500  HGB 9.4*  --  8.6*  --   --  8.6*  --   HCT 29.6*  --  27.4*  --   --  27.4*  --   PLT 247  --  242  --   --  253  --   APTT  --  38*  --   --   --   --   --   LABPROT  --  16.3*  --   --   --   --   --   INR  --  1.3*  --   --   --   --   --   HEPARINUNFRC  --  <0.10*  --    < > 0.22* 0.44 0.29*  CREATININE 11.34*  --  12.01*  --   --  11.50*  --    < > = values in this interval not displayed.     Estimated Creatinine Clearance: 9.4 mL/min (A) (by C-G formula based on SCr of 11.5 mg/dL (H)).   Medical History: Past Medical History:  Diagnosis Date   Anemia of chronic disease    Asthma    well controlled-no inhaler   Calciphylaxis 2020   CHF (congestive heart failure) ([place])    CKD (chronic kidney disease), stage III ([place])    Diabetes ([place])    type 2-diet controlled   Dyspnea    Hypertension    Morbid obesity ([place])    Nephrotic syndrome    PAD (peripheral artery disease) ([place])    Sepsis ([place]) 06/2021    Medications:  No chronic anticoagulation PTA  Assessment: Pharmacy has been consulted to initiate Heparin in 35yo patient admitted with stroke-like symptoms. Her symptoms began to resolve, but neurologist noted concern for lower extremity ischemia, likely embolic.   Goal of Therapy:  Heparin level 0.3-0.5 units/ml (Per Stroke  Protocol) Monitor platelets by anticoagulation protocol: Yes  2/10 2222 HL < 0.10, subtherapeutic 2/11 0906 HL 0.12, subtherapeutic 2/11 2048 HL 0.22, subtherapeutic  2/12 0602 HL 0.44, therapeutic x 1 2/12 1500 HL 0.29, subtherapeutic   Plan:  Increase heparin infusion to 2200 units/hr Recheck HL in 8 hours after rate change Continue to monitor H&H and platelets  Sherilyn Banker, PharmD Clinical Pharmacist 11/28/2021 3:50 PM

## 2021-11-28 NOTE — Progress Notes (Signed)
Neurology Progress Note   S:// Patient transferred to the ICU after having acute respiratory failure with hypoxia while in CT scanner. Did not require intubation Be closely monitored in the ICU Remains extremely encephalopathic drowsy and lethargic today   O:// Current vital signs: BP (!) 95/52    Pulse 96    Temp (!) 96.4 F (35.8 C) (Axillary)    Resp 18    Ht 5' 6"  (1.676 m)    Wt 131.5 kg    SpO2 100%    BMI 46.79 kg/m  Vital signs in last 24 hours: Temp:  [93.4 F (34.1 C)-97.6 F (36.4 C)] 96.4 F (35.8 C) (02/12 0036) Pulse Rate:  [26-100] 96 (02/12 0600) Resp:  [18-43] 18 (02/12 0600) BP: (79-132)/(25-105) 95/52 (02/12 0600) SpO2:  [91 %-100 %] 100 % (02/12 0600) Weight:  [131.5 kg-133.4 kg] 131.5 kg (02/12 0500) General: Sick appearing obese woman, appears older than stated age, laying in bed  HEENT: Normocephalic, atraumatic CVS: Regular rhythm Abdomen: Obese without tenderness Extremities: Puckered and macerated looking skin with right calf ulcer that looks healed, both feet in bandages Respiratory: Breathing well saturating normally on room air Neurological exam Extremely drowsy and lethargic appearing Mimics and follow some commands but remains nonverbal. Cranial nerves: Pupils equal round react light, extraocular movements intact, visual fields appear full, face appears grossly symmetric, tongue and palate midline. Motor spontaneously moves left upper left lower and right lower extremity but movement of the right upper extremity is reduced.  To noxious simulation she does move the right upper extremity to get away from the nausea stimulation. Coordination difficult to assess but no gross dysmetria based on reach for objects.   Medications  Current Facility-Administered Medications:    acetaminophen (TYLENOL) tablet 650 mg, 650 mg, Oral, Q4H PRN **OR** acetaminophen (TYLENOL) 160 MG/5ML solution 650 mg, 650 mg, Per Tube, Q4H PRN, 650 mg at 11/27/21 0123 **OR**  acetaminophen (TYLENOL) suppository 650 mg, 650 mg, Rectal, Q4H PRN, Rise Patience, MD, 650 mg at 11/27/21 0827   aspirin suppository 300 mg, 300 mg, Rectal, Daily, 300 mg at 11/27/21 0826 **OR** aspirin tablet 325 mg, 325 mg, Oral, Daily, Rise Patience, MD, 325 mg at 11/20/2021 1804   ceFEPIme (MAXIPIME) 1 g in sodium chloride 0.9 % 100 mL IVPB, 1 g, Intravenous, Daily, Darnelle Bos, RPH, Stopped at 11/27/21 0941   chlorhexidine (PERIDEX) 0.12 % solution 15 mL, 15 mL, Mouth Rinse, BID, Rust-Chester, Britton L, NP, 15 mL at 11/28/21 0030   Chlorhexidine Gluconate Cloth 2 % PADS 6 each, 6 each, Topical, Daily, Rise Patience, MD, 6 each at 11/27/21 1700   collagenase (SANTYL) ointment, , Topical, Daily, Rise Patience, MD, Given at 11/27/21 0839   dextrose 10 % infusion, , Intravenous, Continuous, Sharion Settler, NP, Last Rate: 25 mL/hr at 11/28/21 0630, Infusion Verify at 11/28/21 0630   dialysis solution 1.5% low-MG/low-CA dianeal solution, , Intraperitoneal, Q24H, Kolluru, Sarath, MD, New Bag at 11/27/21 1908   fentaNYL (SUBLIMAZE) injection 12.5 mcg, 12.5 mcg, Intravenous, Q2H PRN, Danford, Suann Larry, MD   gentamicin cream (GARAMYCIN) 0.1 % 1 application, 1 application, Topical, Daily, Kolluru, Sarath, MD, 1 application at 16/10/96 1700   heparin ADULT infusion 100 units/mL (25000 units/255m), 2,100 Units/hr, Intravenous, Continuous, HDarnelle Bos RPH, Last Rate: 21 mL/hr at 11/28/21 0630, 2,100 Units/hr at 11/28/21 0630   hydrALAZINE (APRESOLINE) injection 5 mg, 5 mg, Intravenous, Q4H PRN, KRise Patience MD   magnesium sulfate IVPB 2  g 50 mL, 2 g, Intravenous, Once, Rust-Chester, Toribio Harbour L, NP   MEDLINE mouth rinse, 15 mL, Mouth Rinse, q12n4p, Rust-Chester, Britton L, NP   pantoprazole (PROTONIX) injection 40 mg, 40 mg, Intravenous, Q24H, Rust-Chester, Britton L, NP, 40 mg at 11/28/21 0405   [COMPLETED] remdesivir 200 mg in sodium chloride 0.9% 250 mL  IVPB, 200 mg, Intravenous, Once, Last Rate: 580 mL/hr at 12/05/2021 1846, 200 mg at 12/07/2021 1846 **FOLLOWED BY** remdesivir 100 mg in sodium chloride 0.9 % 100 mL IVPB, 100 mg, Intravenous, Daily, Rise Patience, MD, Stopped at 11/27/21 0910  Labs CBC    Component Value Date/Time   WBC 17.4 (H) 11/28/2021 0602   RBC 2.73 (L) 11/28/2021 0602   HGB 8.6 (L) 11/28/2021 0602   HGB 12.5 12/31/2013 1020   HCT 27.4 (L) 11/28/2021 0602   HCT 38.5 12/31/2013 1020   PLT 253 11/28/2021 0602   PLT 239 12/31/2013 1020   MCV 100.4 (H) 11/28/2021 0602   MCV 84 12/31/2013 1020   MCH 31.5 11/28/2021 0602   MCHC 31.4 11/28/2021 0602   RDW 18.0 (H) 11/28/2021 0602   RDW 15.2 (H) 12/31/2013 1020   LYMPHSABS 1.8 11/27/2021 0528   MONOABS 0.5 11/27/2021 0528   EOSABS 0.2 11/27/2021 0528   BASOSABS 0.0 11/27/2021 0528    CMP     Component Value Date/Time   NA 136 11/28/2021 0602   NA 135 (L) 12/31/2013 1020   K 4.0 11/28/2021 0602   K 3.9 12/31/2013 1020   CL 93 (L) 11/28/2021 0602   CL 105 12/31/2013 1020   CO2 18 (L) 11/28/2021 0602   CO2 26 12/31/2013 1020   GLUCOSE 108 (H) 11/28/2021 0602   GLUCOSE 243 (H) 12/31/2013 1020   BUN 67 (H) 11/28/2021 0602   BUN 19 (H) 12/31/2013 1020   CREATININE 11.50 (H) 11/28/2021 0602   CREATININE 2.12 (H) 12/31/2013 1020   CALCIUM 7.9 (L) 11/28/2021 0602   CALCIUM 8.5 12/31/2013 1020   PROT 6.5 11/28/2021 0602   PROT 7.4 12/31/2013 1020   ALBUMIN 2.8 (L) 11/28/2021 0602   ALBUMIN 3.1 (L) 12/31/2013 1020   AST 11 (L) 11/28/2021 0602   AST 21 12/31/2013 1020   ALT 6 11/28/2021 0602   ALT 26 12/31/2013 1020   ALKPHOS 120 11/28/2021 0602   ALKPHOS 98 12/31/2013 1020   BILITOT 1.0 11/28/2021 0602   BILITOT 0.4 12/31/2013 1020   GFRNONAA 4 (L) 11/28/2021 0602   GFRNONAA 31 (L) 12/31/2013 1020   GFRAA 5 (L) 06/18/2020 1217   GFRAA 36 (L) 12/31/2013 1020    glycosylated hemoglobin-5.1  Lipid Panel     Component Value Date/Time   CHOL 125  11/19/2021 1830   TRIG 73 11/30/2021 1830   HDL 34 (L) 12/14/2021 1830   CHOLHDL 3.7 12/14/2021 1830   VLDL 15 11/24/2021 1830   LDLCALC 76 11/25/2021 1830   2D echo  IMPRESSIONS   1. Left ventricular ejection fraction, by estimation, is 55 to 60%. The  left ventricle has normal function. The left ventricle has no regional  wall motion abnormalities. There is mild concentric left ventricular  hypertrophy. Left ventricular diastolic  parameters are indeterminate. Elevated left atrial pressure.   2. Right ventricular systolic function is normal. The right ventricular  size is normal.   3. Left atrial size was severely dilated.   4. Restricted anterior and posterior mitral valve leaflet motion.  Compared with the echo in 2017, the mitral valve is now  heavily thickened  and clacified. These findings are all new. Could represent endocarditis.  Recommend TEE to better evaluate. The  mitral valve is degenerative. Moderate mitral valve regurgitation. Severe  mitral stenosis. Severe mitral annular calcification.   5. Tricuspid valve regurgitation is moderate to severe.   6. The aortic valve is tricuspid. Aortic valve regurgitation is not  visualized. No aortic stenosis is present.   7. The inferior vena cava is normal in size with greater than 50%  respiratory variability, suggesting right atrial pressure of 3 mmHg.   Imaging I have reviewed images in epic and the results pertinent to this consultation are: CT-head-no acute changes.  No bleed.   MRI examination of the brain-scattered infarcts in the lateral right frontal lobe, posterior left frontal lobe with patchy acute small infarcts in the callosal splenium on the right with additional subacute infarcts question within the left frontoparietal white matter.  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  Background chronic small vessel ischemic changes within the cerebral white matter.  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  Small chronic cortical subcortical infarcts within the right parietal occipital lobes.  MRA brain without contrast-pending Carotid Dopplers-pending  Foot x-ray concerning for osteomyelitis.  Repeat CT head yesterday-negative for acute process   Assessment: 36 year old woman with extensive vascular risk factor history presenting with strokelike symptoms and altered mental status.  EMS was called to evaluate for altered mental status which her mother think was related to her taking her antihypertensive medications at that time.  On initial evaluation by EMS she had left-sided arm drift and speech difficulty.  On my evaluation initially she did have some left leg weakness and mild left facial droop along with dysarthria.  She was also encephalopathic on her exam. Stat MRI was performed which showed infarcts of different ages precluding her from getting IV thrombolysis.  Her poor modified Rankin score precludes endovascular thrombectomy. Admitted for stroke work-up. Yesterday showed  expressive aphasia but she is intact in terms of following commands.  Her weakness of the left side is less with some right arm drift noted yesterday.  Both legs are antigravity but drift down to the bed and she is not able to keep them above the bed for more than a second. She was also noted to have COVID-19 infection which is likely incidental as she did not have any respiratory symptoms. She has been started on remdesivir. She is also started on heparin on the recommendations of vascular surgery due to concern for right lower extremity thrombus. The cause of her strokes is still under investigation. Given the myriad of medical findings, including renal failure, vascular disease and now strokes, hypercoagulable work-up and a rheumatological work-up will also be ordered.  Impression -Acute ischemic stroke-etiology under investigation -Peripheral vascular disease -ESRD on dialysis -Mitral  valve abnormality on TTE-concern for infective endocarditis -Severe left atrial dilatation-question underlying arrhythmia like paroxysmal atrial fibrillation   Recommendations: -2D echo-concerning for mitral valve endocarditis  -Consider TEE-await cardiology recs -Continue telemetry -Antibiotics per primary team for endocarditis and osteomyelitis follow lower extremity Dopplers -MRA head-pending -Carotid Dopplers-pending -Lower extremity venous Dopplers -pending -Appreciate nephrology consultation -Continue heparin stroke protocol for possible lower extremity arterial thrombus.  Appreciate vascular surgery consultation. -Given concern for endocarditis-I would stop the Plavix as she is already on heparin and Plavix can be resumed once head imaging rules out mycotic aneurysms. -Blood pressure goal systolic [phone] from a neurological standpoint. -Hypercoagulable panel and vasculitis panel  ordered  Neurology will follow Plan relayed to Dr. Loleta Books and Dr. Lanney Gins   Addendum Patient will be transferred to [facility] for ongoing cardiac and cardiothoracic evaluation for the mitral valve. I have notified my team at [facility] and stroke team will evaluate her and follow her upon her arrival. Please call with questions as needed.  -- Amie Portland, MD Neurologist [facility] Pager: [phone]

## 2021-11-28 NOTE — Progress Notes (Signed)
Subjective: Interval History: Respiratory event in CT yesterday necessitating transfer to ICU; patient unable to give meaningful history, still encephalopathic.  Objective: Vital signs in last 24 hours: Temp:  [93.4 F (34.1 C)-97.6 F (36.4 C)] 96.4 F (35.8 C) (02/12 0036) Pulse Rate:  [26-104] 104 (02/12 0700) Resp:  [18-43] 27 (02/12 0700) BP: (79-132)/(25-105) 95/52 (02/12 0600) SpO2:  [91 %-100 %] 91 % (02/12 0700) Weight:  [131.5 kg-133.4 kg] 131.5 kg (02/12 0500)  Intake/Output from previous day: 02/11 0701 - 02/12 0700 In: 1691.2 [I.V.:1092.2; IV Piggyback:598.9] Out: 0  Intake/Output this shift: Total I/O In: 15000 [Other:15000] Out: 82505 [Other:14545]  General appearance: Encephalopathic this a.m.  Please see neurology's notes.  Lower extremities are warmer today than yesterday, has violaceous discoloration of the forefoot on the right.  This is ischemic.  Lab Results: Recent Labs    11/27/21 0528 11/28/21 0602  WBC 14.4* 17.4*  HGB 8.6* 8.6*  HCT 27.4* 27.4*  PLT 242 253   BMET Recent Labs    11/27/21 0528 11/28/21 0602  NA 138 136  K 4.1 4.0  CL 94* 93*  CO2 17* 18*  GLUCOSE 98 108*  BUN 67* 67*  CREATININE 12.01* 11.50*  CALCIUM 8.2* 7.9*    Studies/Results: CT HEAD WO CONTRAST (5MM)  Result Date: 11/27/2021 CLINICAL DATA:  Right-sided weakness.  Aphasia. EXAM: CT HEAD WITHOUT CONTRAST TECHNIQUE: Contiguous axial images were obtained from the base of the skull through the vertex without intravenous contrast. RADIATION DOSE REDUCTION: This exam was performed according to the departmental dose-optimization program which includes automated exposure control, adjustment of the mA and/or kV according to patient size and/or use of iterative reconstruction technique. COMPARISON:  11/22/2021. FINDINGS: Brain: No evidence of acute infarction, hemorrhage, hydrocephalus, extra-axial collection or mass lesion/mass effect. Small infarcts described on the  previous day's exam less well-defined due to patient motion on the current study. Vascular: No hyperdense vessel or unexpected calcification. Skull: No fracture. No discrete bone lesion. Large right frontal osteoma, stable. Sinuses/Orbits: Globes and orbits are unremarkable. Sinuses are clear. Other: None. IMPRESSION: 1. No acute intracranial abnormalities. No change from the previous day's study. Electronically Signed   By: Lajean Manes M.D.   On: 11/27/2021 15:15   MR BRAIN WO CONTRAST  Result Date: 12/11/2021 CLINICAL DATA:  Provided history: Neuro deficit, acute, stroke suspected. EXAM: MRI HEAD WITHOUT CONTRAST TECHNIQUE: Multiplanar, multiecho pulse sequences of the brain and surrounding structures were obtained without intravenous contrast. COMPARISON:  Noncontrast head CT performed immediately prior 12/05/2021. FINDINGS: Brain: An abbreviated protocol examination was performed at the provider's request. The following sequences were acquired: Axial and coronal diffusion-weighted imaging, axial T2 FLAIR sequence and axial SWI sequence. The axial T2 FLAIR sequence is moderately motion degraded, limiting evaluation. Subcentimeter acute cortically-based infarct within the posterior left frontal lobe (series 5, image 35). Subcentimeter acute cortically-based infarct within the lateral right frontal lobe (at the anterior aspect of the right sylvian fissure)(series 5, image 28). Patchy small acute infarcts within the callosal splenium on the right. 12 mm focus of diffusion-weighted hyperintensity within the mid left frontal lobe white matter (series 5, image 32). No definite ADC correlate is appreciated. However, this finding is present on both the axial and coronal diffusion-weighted sequences, and this may reflect a recent infarct. Similarly, there is an ill-defined focus of diffusion-weighted hyperintensity within the left frontoparietal periventricular white matter measuring 10 mm (series 5, image 31). No  definite ADC correlate is appreciated. However, this finding is present  on both the axial and coronal diffusion-weighted sequences, and this may reflect a recent infarct. Small chronic cortical/subcortical infarct within the right parietooccipital lobes. Chronic lacunar infarct within the left caudate nucleus with associated chronic hemosiderin deposition at this site. Multifocal T2 FLAIR hyperintense signal abnormality within the cerebral white matter, nonspecific but compatible with chronic small vessel ischemic disease. These signal changes are overall mild, but advanced for age. No evidence of an intracranial mass or extra-axial fluid collection. No midline shift. Vascular: Flow voids poorly assessed in the absence of T2 TSE imaging. Skull and upper cervical spine: 1.9 cm bony protuberance projecting outward from the right frontal calvarium, likely reflecting an osteoma. Sinuses/Orbits: No acute orbital finding. Mild mucosal thickening within the bilateral ethmoid sinuses. Other: Trapped fluid within the left petrous apex. Impression #2 called by telephone at the time of interpretation on 11/17/2021 at 2:05 pm to provider [facility] , who verbally acknowledged these results. IMPRESSION: 1. Abbreviated protocol motion degraded examination, as described. 2. Subcentimeter acute cortically-based infarcts within the lateral right frontal lobe and posterior left frontal lobe. Patchy small acute infarcts within the callosal splenium on the right. Additional small recent infarcts (measuring up to 12 mm) are questioned within the left frontoparietal white matter. Given small infarcts involving multiple vascular territories, there is suspicion for an embolic process. 3. Small chronic cortical/subcortical infarct within the right parietooccipital lobes. 4. Chronic hemorrhagic lacunar infarct within the left caudate nucleus. 5. Background chronic small-vessel ischemic changes within the cerebral white matter, mild but  advanced for age. 6. 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 7. Mild mucosal thickening within the bilateral ethmoid sinuses. 8. Trapped fluid within the left petrous apex. Electronically Signed   By: Kellie Simmering D.O.   On: 11/17/2021 14:31   PERIPHERAL VASCULAR CATHETERIZATION  Result Date: 11/15/2021 See surgical note for result.  DG Chest Port 1 View  Result Date: 11/27/2021 CLINICAL DATA:  Hypoxia. EXAM: PORTABLE CHEST 1 VIEW COMPARISON:  12/10/2021 FINDINGS: Cardiac silhouette mildly enlarged.  No mediastinal or hilar masses. Subtle hazy airspace opacity suggested in the left mid to upper lung. Remainder of the lungs is clear. No convincing pleural effusion or pneumothorax. Skeletal structures are grossly intact. IMPRESSION: 1. Subtle hazy airspace adjusted in the left mid to upper lung, which appears to be a change from the previous day's exam. This is consistent with pneumonia. Electronically Signed   By: Lajean Manes M.D.   On: 11/27/2021 15:17   DG Chest Port 1 View  Result Date: 12/10/2021 CLINICAL DATA:  Weakness EXAM: PORTABLE CHEST 1 VIEW COMPARISON:  07/07/2021 FINDINGS: Stable cardiomegaly. Mild pulmonary vascular congestion. No overt pulmonary edema. No focal airspace consolidation. No pleural effusion or pneumothorax. IMPRESSION: Cardiomegaly with mild pulmonary vascular congestion. Electronically Signed   By: Davina Poke D.O.   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 There is new bony irregularity along the resection margins of the third, fourth, and fifth metatarsals. No acute fracture. No dislocation. No deep soft tissue ulceration is evident radiographically. Severe small vessel atherosclerotic calcifications. IMPRESSION: Postsurgical changes of  transmetatarsal amputation of the first through fifth rays. New bony irregularity along the resection margins of the third, fourth, and fifth metatarsals concerning for osteomyelitis. Electronically Signed   By: Davina Poke D.O.  On: 12/05/2021 18:37   VAS [place] ABI WITH/WO TBI  Result Date: 11/15/2021  LOWER EXTREMITY DOPPLER STUDY Patient Name:  [name]  Date of Exam:   11/09/2021 Medical Rec #: [id number]          Accession #:    [id number] Date of Birth: 01/18/1986          Patient Gender: F Patient Age:   57 years Exam Location:  [facility] Procedure:      VAS [place] ABI WITH/WO TBI Referring Phys: [name] --------------------------------------------------------------------------------  Indications: Peripheral artery disease.  Vascular Interventions: 05/20/2021 Rt mechanical thrombecomy tibioperoneal trunk                         and prox bilateral PTA and peroneal artery. PTA of right                         PTA and peroneal arteries. Left distal SFA and above                         knee popiteal artery with stent.                         05/27/2021 PTA of left peroneal artery. Sten left distal                         SFA and above knee popliteal artery.                          08/04/2021: Aorta and Selective Right Lower Extremity                         Angiogram. Mechanical Thrombectomy of the Right Distal                         SFA and of the Tibioperoneal Trunk with penumbra CAT 6                         device. PTA of the Right Tibioperoneal trunk and                         Proximal Peroneal Artery with 3 mm diameter angioplasty                         balloon. PTA of the Right Anterior tibial Artery with 2                         mm diameter angioplasty balloon. PTA of the Right                         Posterior tibial Artery with 61mm diameter angioplasty                         balloon into the foot and then multiple inflations with                         a 3 mm diameter  angioplasty balloon from just above the  ankle up to the Tibioperoneal trunk. Primary Stent                         placement to the Right distal SFA fro the Thromotic                         lesion. Comparison Study: 07/30/2021 Performing Technologist: Almira Coaster RVS  Examination Guidelines: A complete evaluation includes at minimum, Doppler waveform signals and systolic blood pressure reading at the level of bilateral brachial, anterior tibial, and posterior tibial arteries, when vessel segments are accessible. Bilateral testing is considered an integral part of a complete examination. Photoelectric Plethysmograph (PPG) waveforms and toe systolic pressure readings are included as required and additional duplex testing as needed. Limited examinations for reoccurring indications may be performed as noted.  ABI Findings: +---------+------------------+-----+--------+--------+  Right     Rt Pressure (mmHg) Index Waveform Comment   +---------+------------------+-----+--------+--------+  Brachial  152                                         +---------+------------------+-----+--------+--------+  PTA       118                0.78                     +---------+------------------+-----+--------+--------+  Great Toe 11                 0.07                     +---------+------------------+-----+--------+--------+ +---------+------------------+-----+--------+--------------+  Left      Lt Pressure (mmHg) Index Waveform Comment         +---------+------------------+-----+--------+--------------+  ATA       110                0.72                           +---------+------------------+-----+--------+--------------+  PERO      104                0.68                           +---------+------------------+-----+--------+--------------+  Great Toe                                   Toes Amputated  +---------+------------------+-----+--------+--------------+  +-------+-----------+--------------+------------+------------+  ABI/TBI Today's ABI Today's TBI    Previous ABI Previous TBI  +-------+-----------+--------------+------------+------------+  Right   .78         .07            0            0             +-------+-----------+--------------+------------+------------+  Left    .72         Toes Amputated 0            0             +-------+-----------+--------------+------------+------------+ Bilateral ABIs appear increased compared to prior study on 07/30/2021.  Summary: Right: Resting right ankle-brachial index indicates moderate right lower extremity arterial  disease. The right toe-brachial index is abnormal. Left: Resting left ankle-brachial index indicates moderate left lower extremity arterial disease.  *See table(s) above for measurements and observations.  Electronically signed by [name] MD on 11/15/2021 at 10:19:43 AM.    Final    ECHOCARDIOGRAM COMPLETE  Result Date: 11/27/2021    ECHOCARDIOGRAM REPORT   Patient Name:   [name] Date of Exam: 11/27/2021 Medical Rec #:  [id number]         Height:       66.0 in Accession #:    [id number]        Weight:       289.0 lb Date of Birth:  1986/07/02         BSA:          2.339 m Patient Age:    36 years          BP:           104/91 mmHg Patient Gender: F                 HR:           107 bpm. Exam Location:  [facility] Procedure: 2D Echo Indications:     Stroke I63.9  History:         Patient has prior history of Echocardiogram examinations, most                  recent 02/12/2016.  Sonographer:     Kathlen Brunswick RDCS Referring Phys:  [place] Diagnosing Phys: Skeet Latch MD  Sonographer Comments: Image acquisition challenging due to uncooperative patient. Non-verbal uncooperative patient. IMPRESSIONS  1. Left ventricular ejection fraction, by estimation, is 55 to 60%. The left ventricle has normal function. The left ventricle has no regional wall motion abnormalities. There is mild  concentric left ventricular hypertrophy. Left ventricular diastolic parameters are indeterminate. Elevated left atrial pressure.  2. Right ventricular systolic function is normal. The right ventricular size is normal.  3. Left atrial size was severely dilated.  4. Restricted anterior and posterior mitral valve leaflet motion. Compared with the echo in 2017, the mitral valve is now heavily thickened and clacified. These findings are all new. Could represent endocarditis. Recommend TEE to better evaluate. The mitral valve is degenerative. Moderate mitral valve regurgitation. Severe mitral stenosis. Severe mitral annular calcification.  5. Tricuspid valve regurgitation is moderate to severe.  6. The aortic valve is tricuspid. Aortic valve regurgitation is not visualized. No aortic stenosis is present.  7. The inferior vena cava is normal in size with greater than 50% respiratory variability, suggesting right atrial pressure of 3 mmHg. FINDINGS  Left Ventricle: Left ventricular ejection fraction, by estimation, is 55 to 60%. The left ventricle has normal function. The left ventricle has no regional wall motion abnormalities. The left ventricular internal cavity size was normal in size. There is  mild concentric left ventricular hypertrophy. Left ventricular diastolic parameters are indeterminate. Elevated left atrial pressure. Right Ventricle: The right ventricular size is normal. No increase in right ventricular wall thickness. Right ventricular systolic function is normal. Left Atrium: Left atrial size was severely dilated. Right Atrium: Right atrial size was normal in size. Pericardium: There is no evidence of pericardial effusion. Mitral Valve: Restricted anterior and posterior mitral valve leaflet motion. Compared with the echo in 2017, the mitral valve is now heavily thickened and clacified. These findings are all new. Could represent endocarditis. Recommend TEE to better evaluate. The mitral  valve is degenerative  in appearance. There is severe thickening of the anterior mitral valve leaflet(s). There is severe calcification of the anterior mitral valve leaflet(s). Severe mitral annular calcification. Moderate mitral valve regurgitation. Severe mitral valve stenosis. MV peak gradient, 24.8 mmHg. The mean mitral valve gradient is 15.0 mmHg. Tricuspid Valve: The tricuspid valve is normal in structure. Tricuspid valve regurgitation is moderate to severe. No evidence of tricuspid stenosis. Aortic Valve: The aortic valve is tricuspid. Aortic valve regurgitation is not visualized. No aortic stenosis is present. Aortic valve peak gradient measures 6.0 mmHg. Pulmonic Valve: The pulmonic valve was normal in structure. Pulmonic valve regurgitation is not visualized. No evidence of pulmonic stenosis. Aorta: The aortic root is normal in size and structure. Venous: The inferior vena cava is normal in size with greater than 50% respiratory variability, suggesting right atrial pressure of 3 mmHg. IAS/Shunts: No atrial level shunt detected by color flow Doppler.  LEFT VENTRICLE PLAX 2D LVIDd:         4.94 cm      Diastology LVIDs:         3.45 cm      LV e' medial:    6.42 cm/s LV PW:         1.30 cm      LV E/e' medial:  30.8 LV IVS:        1.22 cm      LV e' lateral:   5.44 cm/s LVOT diam:     2.00 cm      LV E/e' lateral: 36.4 LV SV:         43 LV SV Index:   18 LVOT Area:     3.14 cm  LV Volumes (MOD) LV vol d, MOD A4C: 105.0 ml LV vol s, MOD A4C: 33.0 ml LV SV MOD A4C:     105.0 ml RIGHT VENTRICLE RV Basal diam:  3.61 cm RV S prime:     11.10 cm/s TAPSE (M-mode): 1.5 cm LEFT ATRIUM              Index        RIGHT ATRIUM           Index LA diam:        3.70 cm  1.58 cm/m   RA Area:     18.10 cm LA Vol (A2C):   60.0 ml  25.66 ml/m  RA Volume:   46.90 ml  20.06 ml/m LA Vol (A4C):   100.0 ml 42.76 ml/m LA Biplane Vol: 80.5 ml  34.42 ml/m  AORTIC VALVE AV Area (Vmax): 2.32 cm AV Vmax:        122.00 cm/s AV Peak Grad:   6.0 mmHg LVOT  Vmax:      90.10 cm/s LVOT Vmean:     62.400 cm/s LVOT VTI:       0.137 m  AORTA Ao Root diam: 2.40 cm Ao Asc diam:  2.90 cm MITRAL VALVE                TRICUSPID VALVE MV Area (PHT): 1.80 cm     TV Peak grad:   53.1 mmHg MV Area VTI:   0.91 cm     TV Vmax:        3.64 m/s MV Peak grad:  24.8 mmHg    TR Peak grad:   54.8 mmHg MV Mean grad:  15.0 mmHg    TR Vmax:        370.00 cm/s MV Vmax:  2.49 m/s MV Vmean:      182.5 cm/s   SHUNTS MV Decel Time: 421 msec     Systemic VTI:  0.14 m MR Peak grad: 86.5 mmHg     Systemic Diam: 2.00 cm MR Mean grad: 56.0 mmHg MR Vmax:      465.00 cm/s MR Vmean:     352.0 cm/s MV E velocity: 198.00 cm/s Skeet Latch MD Electronically signed by Skeet Latch MD Signature Date/Time: 11/27/2021/3:04:41 PM    Final    CT HEAD CODE STROKE WO CONTRAST  Result Date: 12/06/2021 CLINICAL DATA:  Code stroke. Provided history: Neuro deficit, acute, stroke suspected. Additional history provided: Aphasia, left-sided weakness. Last known normal 11:30 a.m. EXAM: CT HEAD WITHOUT CONTRAST TECHNIQUE: Contiguous axial images were obtained from the base of the skull through the vertex without intravenous contrast. RADIATION DOSE REDUCTION: This exam was performed according to the departmental dose-optimization program which includes automated exposure control, adjustment of the mA and/or kV according to patient size and/or use of iterative reconstruction technique. COMPARISON:  Report from head CT 02/21/2002 (images unavailable). FINDINGS: Brain: Mild generalized cerebral atrophy, advanced for age. Small chronic cortical/subcortical infarct within the right parietooccipital lobes. Patchy and ill-defined hypoattenuation within the cerebral white matter, nonspecific but compatible with chronic small vessel ischemic disease. Findings are overall mild, but advanced for age. Small age-indeterminate lacunar infarct within the anterior limb of right internal capsule (series 3, image 18) (series  5, image 32). 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 Scattered supratentorial calcifications, some many of which appear parenchymal. There is no acute intracranial hemorrhage. No acute demarcated cortical infarct is identified. No extra-axial fluid collection. No evidence of an intracranial mass. No midline shift. Vascular: No hyperdense vessel. Atherosclerotic calcifications. Skull: No calvarial fracture. Bony protuberance projecting outward from the right frontal calvarium, measuring 1.9 x 1.1 cm in transaxial dimensions, compatible with an osteoma (series 4, image 42). Sinuses/Orbits: Visualized orbits show no acute finding. No significant paranasal sinus disease. ASPECTS ([place] Stroke Program Early CT Score) - Ganglionic level infarction (caudate, lentiform nuclei, internal capsule, insula, M1-M3 cortex): 6 - Supraganglionic infarction (M4-M6 cortex): 3 Total score (0-10 with 10 being normal): 9 Impressions #1 and #2 were communicated to Dr. Rory Percy at 1:50 pmon 12/02/2021 by text page via the [facility] messaging system. IMPRESSION: 1. No acute intracranial hemorrhage or acute demarcated cortical infarct. 2. Age-indeterminate lacunar infarct within the anterior limb of right internal capsule. 3. Small chronic cortical/subcortical infarct within the right parietooccipital lobes. 4. 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 5. Chronic small-vessel ischemic changes within the cerebral white matter, overall mild but advanced for age. 6. Scattered punctate supratentorial parenchymal calcifications. Findings are nonspecific, but may be seen as sequela of neurocysticercosis. 7. Mild cerebral atrophy, advanced for age. 8. 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 Electronically Signed   By: Kellie Simmering D.O.   On: 11/17/2021 13:51   Anti-infectives: Anti-infectives (From admission, onward)    Start     Dose/Rate Route Frequency Ordered Stop   11/27/21 1100   vancomycin (VANCOREADY) IVPB 2000 mg/400 mL        2,000 mg 200 mL/hr over 120 Minutes Intravenous  Once 11/27/21 1008 11/27/21 1319   11/27/21 1000  remdesivir 100 mg in sodium chloride 0.9 % 100 mL IVPB       See Hyperspace for full Linked Orders Report.   100 mg 200 mL/hr over 30 Minutes Intravenous Daily 11/25/2021 1750 12/14/2021  0349   11/30/2021 1815  ceFEPIme (MAXIPIME) 1 g in sodium chloride 0.9 % 100 mL IVPB        1 g 200 mL/hr over 30 Minutes Intravenous Daily 12/08/2021 1814     11/19/2021 1800  remdesivir 200 mg in sodium chloride 0.9% 250 mL IVPB       See Hyperspace for full Linked Orders Report.   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  On echo she has evidence of potentially mitral valve vegetation.  Arrangements are being made for transfer to [facility] for further evaluation regarding the latter. Intervention with regard to the lower extremities is not a priority under the circumstances.  She faces high risk of amputation, in particular given the poor runoff as documented previously on recent angiogram by Dr. [name] with right lower extremity intervention previously.   Arterial line will be placed at the request of critical care and primary service.  No plans for vascular intervention with regard to the lower extremities.    [name] 11/28/2021, 9:41 AM

## 2021-11-28 NOTE — Assessment & Plan Note (Signed)
Likely bony erosion of metatarsals on left foot on radiograph. -Continue cefepime

## 2021-11-28 NOTE — Procedures (Signed)
Arterial line access was desired for more accurate blood pressure monitoring, blood sampling.  Consent had previously been obtained by ICU services for placement.  Prior to prep and drape, ultrasound was performed demonstrating poor flow at the left radial and ulnar position.  Brachial appeared to be usable.  The left arm was then prepped and draped in the usual aseptic fashion, sterile cover utilized on ultrasound.  Skin and subcutaneous tissue was generously anesthetized with local, and direct cannulation of the brachial artery at the antecubital crease performed with ultrasound access.  Wire was inserted.  Skin was incised with 11 blade, radial line placed and sutured with two-point fixation with provided silk.  Line was attached, flushed, appropriate waveform obtained.  Biopatch was placed, nursing to place sterile dressing.  No immediate complications.  [name], [place] [phone]

## 2021-11-28 NOTE — Progress Notes (Signed)
[facility]  ROUNDING NOTE   Subjective:   [name] was admitted to [facility] on 11/22/2021 for PVD (peripheral vascular disease) ([place]) [I73.9] Pain [R52] Dysarthria [R47.1] Acute CVA (cerebrovascular accident) [facility]) [I63.9] Cerebrovascular 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 ([place]) [I63.40] COVID-19 [U07.1]  Peritoneal dialysis last night. Tolerated treatment well.   Patient somnolent. Mother at bedside.   Echocardiogram with mitral valve with restricted motion.  Moved to ICU for hypotension  Not taking PO - placed on Dextrose infusion. No more hypoglycemic episodes since Friday night.   Wbc 17.4 (14.4)  A-line placed by vascular this morning.    Objective:  Vital signs in last 24 hours:  Temp:  [93.4 F (34.1 C)-97.6 F (36.4 C)] 96.4 F (35.8 C) (02/12 0036) Pulse Rate:  [26-104] 104 (02/12 0700) Resp:  [18-43] 27 (02/12 0700) BP: (79-132)/(25-105) 95/52 (02/12 0600) SpO2:  [91 %-100 %] 91 % (02/12 0700) Weight:  [131.5 kg-133.4 kg] 131.5 kg (02/12 0500)  Weight change: 2.3 kg Filed Weights   11/27/21 0100 11/27/21 1628 11/28/21 0500  Weight: 131.1 kg 133.4 kg 131.5 kg    Intake/Output: I/O last 3 completed shifts: In: 1821.1 [I.V.:1222.2; IV Piggyback:598.9] Out: 0    Intake/Output this shift:  Total I/O In: 15000 [Other:15000] Out: 02774 [Other:14545]  Physical Exam: General: Laying in bed  Head: Normocephalic, atraumatic. Moist oral mucosal membranes  Neck: Supple, trachea midline  Lungs:  Clear to auscultation, normal effort, 2 L [place]  Heart: Regular rate and rhythm  Abdomen:  Soft, nontender, PD catheter, obese  Extremities:  no peripheral edema. Left foot in dressings  Neurologic: Somnolent, however following commands. Right sided weakness  Skin: No lesions  Access: PD catheter    Basic Metabolic Panel: Recent Labs  Lab 11/24/21 1005 12/12/2021 1412 11/27/21 0528 11/28/21 0602  NA 136 140 138 136  K  3.1* 3.8 4.1 4.0  CL 94* 94* 94* 93*  CO2 19* 20* 17* 18*  GLUCOSE 110* 122* 98 108*  BUN 69* 65* 67* 67*  CREATININE 12.79* 11.34* 12.01* 11.50*  CALCIUM 7.8* 8.3* 8.2* 7.9*  MG  --   --   --  1.5*  PHOS  --   --   --  10.0*     Liver Function Tests: Recent Labs  Lab 12/05/2021 1412 11/27/21 0528 11/28/21 0602  AST 11* 11* 11*  ALT 7 6 6   ALKPHOS 131* 124 120  BILITOT 1.0 0.7 1.0  PROT 7.1 6.6 6.5  ALBUMIN 2.8* 2.7* 2.8*    No results for input(s): LIPASE, AMYLASE in the last 168 hours. No results for input(s): AMMONIA in the last 168 hours.  CBC: Recent Labs  Lab 11/24/21 1005 11/19/2021 1320 11/19/2021 1412 11/27/21 0528 11/28/21 0602  WBC 16.9* 17.2* 16.6* 14.4* 17.4*  NEUTROABS  --  14.0* 14.0* 11.8*  --   HGB 9.1* 9.2* 9.4* 8.6* 8.6*  HCT 28.0* 28.4* 29.6* 27.4* 27.4*  MCV 95.6 99.6 98.7 100.4* 100.4*  PLT 253 280 247 242 253     Cardiac Enzymes: No results for input(s): CKTOTAL, CKMB, CKMBINDEX, TROPONINI in the last 168 hours.  BNP: Invalid input(s): POCBNP  CBG: Recent Labs  Lab 11/27/21 1157 11/27/21 1629 11/27/21 1902 11/28/21 0034 11/28/21 0524  GLUCAP 78 75 81 108* 57     Microbiology: Results for orders placed or performed during the hospital encounter of 12/05/2021  Resp Panel by RT-PCR (Flu A&B, Covid) Nasopharyngeal Swab  Status: Abnormal   Collection Time: 12/02/2021  2:12 PM   Specimen: Nasopharyngeal Swab; Nasopharyngeal(NP) swabs in vial transport medium  Result Value Ref Range Status   SARS Coronavirus 2 by RT PCR POSITIVE (A) NEGATIVE Final    Comment: (NOTE) SARS-CoV-2 target nucleic acids are DETECTED.  The SARS-CoV-2 RNA is generally detectable in upper respiratory specimens during the acute phase of infection. Positive results are indicative of the presence of the identified virus, but do not rule out bacterial infection or co-infection with other pathogens not detected by the test. Clinical correlation with patient  history and other diagnostic information is necessary to determine patient infection status. The expected result is Negative.  Fact Sheet for Patients: EntrepreneurPulse.com.au  Fact Sheet for Healthcare Providers: IncredibleEmployment.be  This test is not yet approved or cleared by the [place] FDA and  has been authorized for detection and/or diagnosis of SARS-CoV-2 by FDA under an Emergency Use Authorization (EUA).  This EUA will remain in effect (meaning this test can be used) for the duration of  the COVID-19 declaration under Section 564(b)(1) of the A ct, 21 U.S.C. section 360bbb-3(b)(1), unless the authorization is terminated or revoked sooner.     Influenza A by PCR NEGATIVE NEGATIVE Final   Influenza B by PCR NEGATIVE NEGATIVE Final    Comment: (NOTE) The Xpert Xpress SARS-CoV-2/FLU/RSV plus assay is intended as an aid in the diagnosis of influenza from Nasopharyngeal swab specimens and should not be used as a sole basis for treatment. Nasal washings and aspirates are unacceptable for Xpert Xpress SARS-CoV-2/FLU/RSV testing.  Fact Sheet for Patients: EntrepreneurPulse.com.au  Fact Sheet for Healthcare Providers: IncredibleEmployment.be  This test is not yet approved or cleared by the [place] FDA and has been authorized for detection and/or diagnosis of SARS-CoV-2 by FDA under an Emergency Use Authorization (EUA). This EUA will remain in effect (meaning this test can be used) for the duration of the COVID-19 declaration under Section 564(b)(1) of the Act, 21 U.S.C. section 360bbb-3(b)(1), unless the authorization is terminated or revoked.  Performed at [facility], [address] [place], [place] [address]   CULTURE, BLOOD (ROUTINE X 2) w Reflex to ID Panel     Status: None (Preliminary result)   Collection Time: 11/27/21 10:32 AM   Specimen: BLOOD  Result Value Ref  Range Status   Specimen Description BLOOD BLOOD RIGHT HAND  Final   Special Requests   Final    BOTTLES DRAWN AEROBIC AND ANAEROBIC Blood Culture adequate volume   Culture   Final    NO GROWTH < 24 HOURS Performed at [facility], [address], [place], [place] [address]    Report Status PENDING  Incomplete  CULTURE, BLOOD (ROUTINE X 2) w Reflex to ID Panel     Status: None (Preliminary result)   Collection Time: 11/27/21 10:32 AM   Specimen: BLOOD  Result Value Ref Range Status   Specimen Description BLOOD BLOOD RIGHT ARM  Final   Special Requests   Final    BOTTLES DRAWN AEROBIC AND ANAEROBIC Blood Culture adequate volume   Culture   Final    NO GROWTH < 24 HOURS Performed at [facility], [address] [place], [place] [address]    Report Status PENDING  Incomplete  MRSA Next Gen by PCR, Nasal     Status: None   Collection Time: 11/27/21  4:24 PM   Specimen: Nasal Mucosa; Nasal Swab  Result Value Ref Range Status  MRSA by PCR Next Gen NOT DETECTED NOT DETECTED Final    Comment: (NOTE) The GeneXpert MRSA Assay (FDA approved for NASAL specimens only), is one component of a comprehensive MRSA colonization surveillance program. It is not intended to diagnose MRSA infection nor to guide or monitor treatment for MRSA infections. Test performance is not FDA approved in patients less than 25 years old. Performed at [facility], [address] [place], [place] [address]     Coagulation Studies: Recent Labs    11/21/2021 2222  LABPROT 16.3*  INR 1.3*     Urinalysis: No results for input(s): COLORURINE, LABSPEC, PHURINE, GLUCOSEU, HGBUR, BILIRUBINUR, KETONESUR, PROTEINUR, UROBILINOGEN, NITRITE, LEUKOCYTESUR in the last 72 hours.  Invalid input(s): APPERANCEUR    Imaging: CT HEAD WO CONTRAST (5MM)  Result Date: 11/27/2021 CLINICAL DATA:  Right-sided weakness.  Aphasia. EXAM: CT HEAD WITHOUT CONTRAST TECHNIQUE: Contiguous axial images  were obtained from the base of the skull through the vertex without intravenous contrast. RADIATION DOSE REDUCTION: This exam was performed according to the departmental dose-optimization program which includes automated exposure control, adjustment of the mA and/or kV according to patient size and/or use of iterative reconstruction technique. COMPARISON:  11/19/2021. FINDINGS: Brain: No evidence of acute infarction, hemorrhage, hydrocephalus, extra-axial collection or mass lesion/mass effect. Small infarcts described on the previous day's exam less well-defined due to patient motion on the current study. Vascular: No hyperdense vessel or unexpected calcification. Skull: No fracture. No discrete bone lesion. Large right frontal osteoma, stable. Sinuses/Orbits: Globes and orbits are unremarkable. Sinuses are clear. Other: None. IMPRESSION: 1. No acute intracranial abnormalities. No change from the previous day's study. Electronically Signed   By: Lajean Manes M.D.   On: 11/27/2021 15:15   MR BRAIN WO CONTRAST  Result Date: 12/09/2021 CLINICAL DATA:  Provided history: Neuro deficit, acute, stroke suspected. EXAM: MRI HEAD WITHOUT CONTRAST TECHNIQUE: Multiplanar, multiecho pulse sequences of the brain and surrounding structures were obtained without intravenous contrast. COMPARISON:  Noncontrast head CT performed immediately prior 12/12/2021. FINDINGS: Brain: An abbreviated protocol examination was performed at the provider's request. The following sequences were acquired: Axial and coronal diffusion-weighted imaging, axial T2 FLAIR sequence and axial SWI sequence. The axial T2 FLAIR sequence is moderately motion degraded, limiting evaluation. Subcentimeter acute cortically-based infarct within the posterior left frontal lobe (series 5, image 35). Subcentimeter acute cortically-based infarct within the lateral right frontal lobe (at the anterior aspect of the right sylvian fissure)(series 5, image 28). Patchy  small acute infarcts within the callosal splenium on the right. 12 mm focus of diffusion-weighted hyperintensity within the mid left frontal lobe white matter (series 5, image 32). No definite ADC correlate is appreciated. However, this finding is present on both the axial and coronal diffusion-weighted sequences, and this may reflect a recent infarct. Similarly, there is an ill-defined focus of diffusion-weighted hyperintensity within the left frontoparietal periventricular white matter measuring 10 mm (series 5, image 31). No definite ADC correlate is appreciated. However, this finding is present on both the axial and coronal diffusion-weighted sequences, and this may reflect a recent infarct. Small chronic cortical/subcortical infarct within the right parietooccipital lobes. Chronic lacunar infarct within the left caudate nucleus with associated chronic hemosiderin deposition at this site. Multifocal T2 FLAIR hyperintense signal abnormality within the cerebral white matter, nonspecific but compatible with chronic small vessel ischemic disease. These signal changes are overall mild, but advanced for age. No evidence of an intracranial mass or extra-axial fluid collection. No midline shift. Vascular: Flow voids poorly  assessed in the absence of T2 TSE imaging. Skull and upper cervical spine: 1.9 cm bony protuberance projecting outward from the right frontal calvarium, likely reflecting an osteoma. Sinuses/Orbits: No acute orbital finding. Mild mucosal thickening within the bilateral ethmoid sinuses. Other: Trapped fluid within the left petrous apex. Impression #2 called by telephone at the time of interpretation on 12/13/2021 at 2:05 pm to provider [facility] , who verbally acknowledged these results. IMPRESSION: 1. Abbreviated protocol motion degraded examination, as described. 2. Subcentimeter acute cortically-based infarcts within the lateral right frontal lobe and posterior left frontal lobe. Patchy small  acute infarcts within the callosal splenium on the right. Additional small recent infarcts (measuring up to 12 mm) are questioned within the left frontoparietal white matter. Given small infarcts involving multiple vascular territories, there is suspicion for an embolic process. 3. Small chronic cortical/subcortical infarct within the right parietooccipital lobes. 4. Chronic hemorrhagic lacunar infarct within the left caudate nucleus. 5. Background chronic small-vessel ischemic changes within the cerebral white matter, mild but advanced for age. 6. 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 7. Mild mucosal thickening within the bilateral ethmoid sinuses. 8. Trapped fluid within the left petrous apex. Electronically Signed   By: Kellie Simmering D.O.   On: 11/25/2021 14:31   DG Chest Port 1 View  Result Date: 11/27/2021 CLINICAL DATA:  Hypoxia. EXAM: PORTABLE CHEST 1 VIEW COMPARISON:  12/13/2021 FINDINGS: Cardiac silhouette mildly enlarged.  No mediastinal or hilar masses. Subtle hazy airspace opacity suggested in the left mid to upper lung. Remainder of the lungs is clear. No convincing pleural effusion or pneumothorax. Skeletal structures are grossly intact. IMPRESSION: 1. Subtle hazy airspace adjusted in the left mid to upper lung, which appears to be a change from the previous day's exam. This is consistent with pneumonia. Electronically Signed   By: Lajean Manes M.D.   On: 11/27/2021 15:17   DG Chest Port 1 View  Result Date: 11/25/2021 CLINICAL DATA:  Weakness EXAM: PORTABLE CHEST 1 VIEW COMPARISON:  07/07/2021 FINDINGS: Stable cardiomegaly. Mild pulmonary vascular congestion. No overt pulmonary edema. No focal airspace consolidation. No pleural effusion or pneumothorax. IMPRESSION: Cardiomegaly with mild pulmonary vascular congestion. Electronically Signed   By: Davina Poke D.O.   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 There is new bony irregularity along the resection margins of the third, fourth, and fifth metatarsals. No acute fracture. No dislocation. No deep soft tissue ulceration is evident radiographically. Severe small vessel atherosclerotic calcifications. 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 New bony irregularity along the resection margins of the third, fourth, and fifth metatarsals concerning for osteomyelitis. Electronically Signed   By: Davina Poke D.O.   On: 11/18/2021 18:37   ECHOCARDIOGRAM COMPLETE  Result Date: 11/27/2021    ECHOCARDIOGRAM REPORT   Patient Name:   [name] Date of Exam: 11/27/2021 Medical Rec #:  [id number]         Height:       66.0 in Accession #:    [id number]        Weight:       289.0 lb Date of Birth:  04-Apr-1986         BSA:          2.339 m Patient  Age:    36 years          BP:           104/91 mmHg Patient Gender: F                 HR:           107 bpm. Exam Location:  [facility] Procedure: 2D Echo Indications:     Stroke I63.9  History:         Patient has prior history of Echocardiogram examinations, most                  recent 02/12/2016.  Sonographer:     Kathlen Brunswick RDCS Referring Phys:  [place] Diagnosing Phys: Skeet Latch MD  Sonographer Comments: Image acquisition challenging due to uncooperative patient. Non-verbal uncooperative patient. IMPRESSIONS  1. Left ventricular ejection fraction, by estimation, is 55 to 60%. The left ventricle has normal function. The left ventricle has no regional wall motion abnormalities. There is mild concentric left ventricular hypertrophy. Left ventricular diastolic parameters are indeterminate. Elevated left atrial pressure.  2. Right ventricular systolic function is normal. The right ventricular size is  normal.  3. Left atrial size was severely dilated.  4. Restricted anterior and posterior mitral valve leaflet motion. Compared with the echo in 2017, the mitral valve is now heavily thickened and clacified. These findings are all new. Could represent endocarditis. Recommend TEE to better evaluate. The mitral valve is degenerative. Moderate mitral valve regurgitation. Severe mitral stenosis. Severe mitral annular calcification.  5. Tricuspid valve regurgitation is moderate to severe.  6. The aortic valve is tricuspid. Aortic valve regurgitation is not visualized. No aortic stenosis is present.  7. The inferior vena cava is normal in size with greater than 50% respiratory variability, suggesting right atrial pressure of 3 mmHg. FINDINGS  Left Ventricle: Left ventricular ejection fraction, by estimation, is 55 to 60%. The left ventricle has normal function. The left ventricle has no regional wall motion abnormalities. The left ventricular internal cavity size was normal in size. There is  mild concentric left ventricular hypertrophy. Left ventricular diastolic parameters are indeterminate. Elevated left atrial pressure. Right Ventricle: The right ventricular size is normal. No increase in right ventricular wall thickness. Right ventricular systolic function is normal. Left Atrium: Left atrial size was severely dilated. Right Atrium: Right atrial size was normal in size. Pericardium: There is no evidence of pericardial effusion. Mitral Valve: Restricted anterior and posterior mitral valve leaflet motion. Compared with the echo in 2017, the mitral valve is now heavily thickened and clacified. These findings are all new. Could represent endocarditis. Recommend TEE to better evaluate. The mitral valve is degenerative in appearance. There is severe thickening of the anterior mitral valve leaflet(s). There is severe calcification of the anterior mitral valve leaflet(s). Severe mitral annular calcification. Moderate mitral  valve regurgitation. Severe mitral valve stenosis. MV peak gradient, 24.8 mmHg. The mean mitral valve gradient is 15.0 mmHg. Tricuspid Valve: The tricuspid valve is normal in structure. Tricuspid valve regurgitation is moderate to severe. No evidence of tricuspid stenosis. Aortic Valve: The aortic valve is tricuspid. Aortic valve regurgitation is not visualized. No aortic stenosis is present. Aortic valve peak gradient measures 6.0 mmHg. Pulmonic Valve: The pulmonic valve was normal in structure. Pulmonic valve regurgitation is not visualized. No evidence of pulmonic stenosis. Aorta: The aortic root is normal in size and structure. Venous: The inferior vena cava is normal in size with greater than 50%  respiratory variability, suggesting right atrial pressure of 3 mmHg. IAS/Shunts: No atrial level shunt detected by color flow Doppler.  LEFT VENTRICLE PLAX 2D LVIDd:         4.94 cm      Diastology LVIDs:         3.45 cm      LV e' medial:    6.42 cm/s LV PW:         1.30 cm      LV E/e' medial:  30.8 LV IVS:        1.22 cm      LV e' lateral:   5.44 cm/s LVOT diam:     2.00 cm      LV E/e' lateral: 36.4 LV SV:         43 LV SV Index:   18 LVOT Area:     3.14 cm  LV Volumes (MOD) LV vol d, MOD A4C: 105.0 ml LV vol s, MOD A4C: 33.0 ml LV SV MOD A4C:     105.0 ml RIGHT VENTRICLE RV Basal diam:  3.61 cm RV S prime:     11.10 cm/s TAPSE (M-mode): 1.5 cm LEFT ATRIUM              Index        RIGHT ATRIUM           Index LA diam:        3.70 cm  1.58 cm/m   RA Area:     18.10 cm LA Vol (A2C):   60.0 ml  25.66 ml/m  RA Volume:   46.90 ml  20.06 ml/m LA Vol (A4C):   100.0 ml 42.76 ml/m LA Biplane Vol: 80.5 ml  34.42 ml/m  AORTIC VALVE AV Area (Vmax): 2.32 cm AV Vmax:        122.00 cm/s AV Peak Grad:   6.0 mmHg LVOT Vmax:      90.10 cm/s LVOT Vmean:     62.400 cm/s LVOT VTI:       0.137 m  AORTA Ao Root diam: 2.40 cm Ao Asc diam:  2.90 cm MITRAL VALVE                TRICUSPID VALVE MV Area (PHT): 1.80 cm     TV Peak  grad:   53.1 mmHg MV Area VTI:   0.91 cm     TV Vmax:        3.64 m/s MV Peak grad:  24.8 mmHg    TR Peak grad:   54.8 mmHg MV Mean grad:  15.0 mmHg    TR Vmax:        370.00 cm/s MV Vmax:       2.49 m/s MV Vmean:      182.5 cm/s   SHUNTS MV Decel Time: 421 msec     Systemic VTI:  0.14 m MR Peak grad: 86.5 mmHg     Systemic Diam: 2.00 cm MR Mean grad: 56.0 mmHg MR Vmax:      465.00 cm/s MR Vmean:     352.0 cm/s MV E velocity: 198.00 cm/s Skeet Latch MD Electronically signed by Skeet Latch MD Signature Date/Time: 11/27/2021/3:04:41 PM    Final    CT HEAD CODE STROKE WO CONTRAST  Result Date: 11/20/2021 CLINICAL DATA:  Code stroke. Provided history: Neuro deficit, acute, stroke suspected. Additional history provided: Aphasia, left-sided weakness. Last known normal 11:30 a.m. EXAM: CT HEAD WITHOUT CONTRAST TECHNIQUE: Contiguous axial images were obtained from the  base of the skull through the vertex without intravenous contrast. RADIATION DOSE REDUCTION: This exam was performed according to the departmental dose-optimization program which includes automated exposure control, adjustment of the mA and/or kV according to patient size and/or use of iterative reconstruction technique. COMPARISON:  Report from head CT 02/21/2002 (images unavailable). FINDINGS: Brain: Mild generalized cerebral atrophy, advanced for age. Small chronic cortical/subcortical infarct within the right parietooccipital lobes. Patchy and ill-defined hypoattenuation within the cerebral white matter, nonspecific but compatible with chronic small vessel ischemic disease. Findings are overall mild, but advanced for age. Small age-indeterminate lacunar infarct within the anterior limb of right internal capsule (series 3, image 18) (series 5, image 32). 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 Scattered supratentorial calcifications, some many of which appear parenchymal. There is no acute intracranial hemorrhage. No acute  demarcated cortical infarct is identified. No extra-axial fluid collection. No evidence of an intracranial mass. No midline shift. Vascular: No hyperdense vessel. Atherosclerotic calcifications. Skull: No calvarial fracture. Bony protuberance projecting outward from the right frontal calvarium, measuring 1.9 x 1.1 cm in transaxial dimensions, compatible with an osteoma (series 4, image 42). Sinuses/Orbits: Visualized orbits show no acute finding. No significant paranasal sinus disease. ASPECTS ([place] Stroke Program Early CT Score) - Ganglionic level infarction (caudate, lentiform nuclei, internal capsule, insula, M1-M3 cortex): 6 - Supraganglionic infarction (M4-M6 cortex): 3 Total score (0-10 with 10 being normal): 9 Impressions #1 and #2 were communicated to Dr. Rory Percy at 1:50 pmon 11/23/2021 by text page via the [facility] messaging system. IMPRESSION: 1. No acute intracranial hemorrhage or acute demarcated cortical infarct. 2. Age-indeterminate lacunar infarct within the anterior limb of right internal capsule. 3. Small chronic cortical/subcortical infarct within the right parietooccipital lobes. 4. 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 5. Chronic small-vessel ischemic changes within the cerebral white matter, overall mild but advanced for age. 6. Scattered punctate supratentorial parenchymal calcifications. Findings are nonspecific, but may be seen as sequela of neurocysticercosis. 7. Mild cerebral atrophy, advanced for age. 8. 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 Electronically Signed   By: Kellie Simmering D.O.   On: 11/30/2021 13:51     Medications:    ceFEPime (MAXIPIME) IV Stopped (11/27/21 0941)   dextrose 25 mL/hr at 11/28/21 0630   dialysis solution 1.5% low-MG/low-CA     heparin 2,100 Units/hr (11/28/21 0630)   magnesium sulfate bolus IVPB     remdesivir 100 mg in NS 100 mL Stopped (11/27/21 0910)    aspirin  300 mg Rectal Daily   Or   aspirin  325  mg Oral Daily   chlorhexidine  15 mL Mouth Rinse BID   Chlorhexidine Gluconate Cloth  6 each Topical Daily   collagenase   Topical Daily   gentamicin cream  1 application Topical Daily   mouth rinse  15 mL Mouth Rinse q12n4p   pantoprazole (PROTONIX) IV  40 mg Intravenous Q24H   acetaminophen **OR** acetaminophen (TYLENOL) oral liquid 160 mg/5 mL **OR** acetaminophen, fentaNYL (SUBLIMAZE) injection, hydrALAZINE  Assessment/ Plan:  Ms. YARETHZI BRANAN is a 36 y.o. black female with end stage renal disease on peritoneal dialysis, hypertension, congestive heart failure, diabetes mellitus type II, peripheral vascular disease who is admitted to [facility] on 12/06/2021 for PVD (peripheral vascular disease) ([place]) [I73.9] Pain [R52] Dysarthria [R47.1] Acute CVA (cerebrovascular accident) ([place]) [I63.9] Cerebrovascular 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 ([place]) [I63.40] COVID-19 [U07.1]  COVID-19 + Patient was admitted as a code stroke.  She was scheduled for debridement of her left foot wound later that day. Procedure was canceled.   [facility] Shanon Payor 132kg CCPD 9 hours 5 exchanges 3058mL fills and last fill of 2061mL  End Stage Renal Disease: Peritoneal dialysis treatment last night. Tolerated treatment well. Will continue treatments PD tonight tonight.   Hypotension: blood pressure are labile versus inaccurate due to body habitus and vascular calcifications leading to pseudohypertension - Arterial line placed by vascular.   Anemia of kidney disease: Hemoglobin 8.6. Hold off on EPO due to ischemic event.   Secondary Hyperparathyroidism with hyperphosphatemia and calciphylaxis: Labs from 2/7: PTH 1882, calcium 8 and phos 8.1. Getting sodium thiosulfate weekly.  - When patient is able to take PO, recommend restarting binders.  5. 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 Hypoglycemia on 2/10. Hemoglobin A1c of 5.1% on  admission - Placed on dextrose 10 infusion   6. Altered mental status: with concerns for acute ischemic stroke. Vasculitis work up by neurology. Echocardiogram with mitral valve lesion.   - Appreciate neurology, cardiology, vascular and critical care input.   7. Peripheral vascular disease: Antibiotics as per podiatry and vascular. Patient with high risk of requiring amputation.   8. COVID-19 infection: placed on isolation and remdesivir. Support care.   Patient is to be moved to [facility] for mitral valve disease.    LOS: 2 Leldon Steege 2/12/202310:27 AM

## 2021-11-28 NOTE — Progress Notes (Signed)
Pt's mother benefited from some spiritual care. Ch and Mother went into waiting lounge to talk and pray.

## 2021-11-28 NOTE — Consult Note (Signed)
Cardiology Consultation:   Patient ID: ALEXANDRINA FIORINI MRN: [id number]; DOB: [birth year]  Admit date: 11/30/2021 Date of Consult: 11/28/2021  PCP:  Danelle Berry, NP   [facility] Providers Cardiologist:  None        Patient Profile:   ANNSLEE TERCERO is a 36 y.o. 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 Loleta Books.  History of Present Illness:   Ms. Carlo was scheduled for a vascular procedure on 2/10.  However prior to the procedure she became acutely confused and had difficulty speaking.  She also had difficulty moving her left side.  A code stroke was called in the emergency department.  She did have some improvement in her speech and her left-sided weakness, but remained encephalopathic.  MRI showed acute and subacute strokes in multiple vascular territories consistent with embolic disease.  She was found to be pulseless in the right foot and vascular surgery was consulted.  She was started on a heparin drip per vascular surgery.  She was started on antibiotics for concern of osteomyelitis of the foot.  An echocardiogram was obtained which revealed LVEF 55 to 60% with mild LVH.  There is severe thickening of the mitral valve leaflets with moderate mitral vegetation and severe mitral stenosis.  There is also moderate to severe tricuspid regurgitation.  The mean mitral valve gradient was 15 mmHg with a calculated mitral valve area of 1.8 cm by pressure half-time and 0.91 cm by the dimensionless index.  This was considerably different than her echo in 2017 which revealed no significant mitral valvular pathology.  She was noted to be COVID-positive on admission.  No growth to date  on blood cultures.  On 2/11 she developed new confusion and right-sided hemiparesis.  A stat head CT was ordered.  However when she lay flat she became hypoxic and a rapid response was called.  There were no changes in her head CT findings.  She had a repeat chest x-ray which showed a new opacity in the left mid to upper lung.  Findings were concerning for pneumonia.  She is currently sleeping and unable to be aroused. She just received haldol.  She was combative with staff.  They attempted to do her carotid Dopplers and she became hypoxic and was swinging at the staff.     Past Medical History:  Diagnosis Date   Anemia of chronic disease    Asthma    well controlled-no inhaler   Calciphylaxis 2020   CHF (congestive heart failure) ([place])    CKD (chronic kidney disease), stage III ([place])    Diabetes ([place])    type 2-diet controlled   Dyspnea    Hypertension    Morbid obesity ([place])    Nephrotic syndrome    PAD (peripheral artery disease) ([place])    Sepsis ([place]) 06/2021    Past Surgical History:  Procedure Laterality Date   A/V FISTULAGRAM Left 10/29/2020   Procedure: A/V FISTULAGRAM;  Surgeon: Algernon Huxley, MD;  Location: [place] [facility];  Service: Cardiovascular;  Laterality: Left;   APPLICATION OF WOUND VAC Bilateral 09/15/2019   Procedure: APPLICATION OF WOUND VAC;  Surgeon: Benjamine Sprague, DO;  Location: [facility];  Service: General;  Laterality: Bilateral;  AV FISTULA PLACEMENT Left 07/09/2020   Procedure: ARTERIOVENOUS (AV) FISTULA CREATION ( BRACHIAL CEPHALIC);  Surgeon: Algernon Huxley, MD;  Location: [facility];  Service: Vascular;  Laterality: Left;   DIALYSIS/PERMA CATHETER INSERTION N/A 02/28/2019   Procedure: DIALYSIS/PERMA CATHETER INSERTION;  Surgeon: Algernon Huxley, MD;  Location: [place] [facility];  Service: Cardiovascular;  Laterality: N/A;   DIALYSIS/PERMA CATHETER INSERTION N/A 09/17/2019   Procedure: DIALYSIS/PERMA CATHETER EXCHANGE;  Surgeon: Katha Cabal, MD;   Location: [place] [facility];  Service: Cardiovascular;  Laterality: N/A;   DIALYSIS/PERMA CATHETER REMOVAL N/A 03/18/2020   Procedure: DIALYSIS/PERMA CATHETER REMOVAL;  Surgeon: Algernon Huxley, MD;  Location: [place] [facility];  Service: Cardiovascular;  Laterality: N/A;   LIGATION OF ARTERIOVENOUS  FISTULA Left 06/03/2021   Procedure: LIGATION OF ARTERIOVENOUS  FISTULA;  Surgeon: Algernon Huxley, MD;  Location: [facility];  Service: Vascular;  Laterality: Left;   LOWER EXTREMITY ANGIOGRAPHY Left 12/02/2020   Procedure: LOWER EXTREMITY ANGIOGRAPHY;  Surgeon: Algernon Huxley, MD;  Location: [place] [facility];  Service: Cardiovascular;  Laterality: Left;   LOWER EXTREMITY ANGIOGRAPHY Right 01/25/2021   Procedure: LOWER EXTREMITY ANGIOGRAPHY;  Surgeon: Algernon Huxley, MD;  Location: [place] [facility];  Service: Cardiovascular;  Laterality: Right;   LOWER EXTREMITY ANGIOGRAPHY Left 04/15/2021   Procedure: LOWER EXTREMITY ANGIOGRAPHY;  Surgeon: Algernon Huxley, MD;  Location: [place] [facility];  Service: Cardiovascular;  Laterality: Left;   LOWER EXTREMITY ANGIOGRAPHY Right 05/20/2021   Procedure: LOWER EXTREMITY ANGIOGRAPHY;  Surgeon: Algernon Huxley, MD;  Location: [place] [facility];  Service: Cardiovascular;  Laterality: Right;   LOWER EXTREMITY ANGIOGRAPHY Left 05/27/2021   Procedure: LOWER EXTREMITY ANGIOGRAPHY;  Surgeon: Algernon Huxley, MD;  Location: [place] [facility];  Service: Cardiovascular;  Laterality: Left;   LOWER EXTREMITY ANGIOGRAPHY Right 08/04/2021   Procedure: LOWER EXTREMITY ANGIOGRAPHY;  Surgeon: Algernon Huxley, MD;  Location: [place] [facility];  Service: Cardiovascular;  Laterality: Right;   LOWER EXTREMITY ANGIOGRAPHY Left 11/15/2021   Procedure: LOWER EXTREMITY ANGIOGRAPHY;  Surgeon: Algernon Huxley, MD;  Location: [place] [facility];  Service: Cardiovascular;  Laterality: Left;   pd cath  05/21/2019   TENDON LENGTHENING Left 09/08/2021   Procedure: TENDON ACHILLES LENGTHENING;   Surgeon: Criselda Peaches, DPM;  Location: [facility];  Service: Podiatry;  Laterality: Left;   TONSILLECTOMY     TRANSMETATARSAL AMPUTATION Left 09/08/2021   Procedure: TRANSMETATARSAL AMPUTATION;  Surgeon: Criselda Peaches, DPM;  Location: [facility];  Service: Podiatry;  Laterality: Left;   UPPER EXTREMITY ANGIOGRAPHY Left 04/15/2021   Procedure: UPPER EXTREMITY ANGIOGRAPHY;  Surgeon: Algernon Huxley, MD;  Location: [place] [facility];  Service: Cardiovascular;  Laterality: Left;   WOUND DEBRIDEMENT Bilateral 09/15/2019   Procedure: DEBRIDEMENT WOUND;  Surgeon: Benjamine Sprague, DO;  Location: [facility];  Service: General;  Laterality: Bilateral;     Home Medications:  Prior to Admission medications   Medication Sig Start Date End Date Taking? Authorizing Provider  amLODipine (NORVASC) 10 MG tablet Take 10 mg by mouth daily as needed (elevated blood pressure).   Yes [provider]  aspirin EC 81 MG EC tablet Take 1 tablet (81 mg total) by mouth daily. 02/16/16  Yes Demetrios Loll, MD  atorvastatin (LIPITOR) 20 MG tablet Take 1 tablet (20 mg total) by mouth daily. 11/15/21  Yes Dew, Erskine Squibb, MD  carvedilol (COREG) 25 MG tablet Take 25 mg by mouth 2 (  two) times daily as needed (elevated blood pressure).   Yes [provider]  clopidogrel (PLAVIX) 75 MG tablet Take 1 tablet (75 mg total) by mouth daily. 11/15/21  Yes Dew, Erskine Squibb, MD  isosorbide mononitrate (IMDUR) 30 MG 24 hr tablet Take 30 mg by mouth 2 (two) times daily as needed (elevated blood pressure).   Yes [provider]  losartan (COZAAR) 100 MG tablet Take 100 mg by mouth daily as needed (elevated blood pressure).   Yes [provider]  cilostazol (PLETAL) 100 MG tablet Take 1 tablet (100 mg total) by mouth 2 (two) times daily before a meal. Patient not taking: Reported on 12/12/2021 05/07/21   Kris Hartmann, NP  lactulose (CHRONULAC) 10 GM/15ML solution Take 30 mLs (20 g total) by mouth 2 (two) times daily as  needed for mild constipation. Patient not taking: Reported on 11/29/2021 07/09/21   Edwin Dada, MD  multivitamin (RENA-VIT) TABS tablet Take 1 tablet by mouth at bedtime. Patient not taking: Reported on 11/15/2021 09/18/19   Fritzi Mandes, MD  ondansetron (ZOFRAN) 4 MG tablet Take 1 tablet (4 mg total) by mouth every 6 (six) hours as needed for nausea. Patient not taking: Reported on 11/09/2021 09/08/21   Criselda Peaches, DPM    Inpatient Medications: Scheduled Meds:  aspirin  300 mg Rectal Daily   Or   aspirin  325 mg Oral Daily   chlorhexidine  15 mL Mouth Rinse BID   Chlorhexidine Gluconate Cloth  6 each Topical Daily   collagenase   Topical Daily   gentamicin cream  1 application Topical Daily   mouth rinse  15 mL Mouth Rinse q12n4p   pantoprazole (PROTONIX) IV  40 mg Intravenous Q24H   Continuous Infusions:  ceFEPime (MAXIPIME) IV 200 mL/hr at 11/28/21 1407   dextrose Stopped (11/28/21 1205)   dialysis solution 1.5% low-MG/low-CA     heparin 2,200 Units/hr (11/28/21 1703)   remdesivir 100 mg in NS 100 mL Stopped (11/28/21 1158)   PRN Meds: acetaminophen **OR** acetaminophen (TYLENOL) oral liquid 160 mg/5 mL **OR** acetaminophen, fentaNYL (SUBLIMAZE) injection, hydrALAZINE  Allergies:    Allergies  Allergen Reactions   Percocet [Oxycodone-Acetaminophen] Itching   Tomato Itching   Gabapentin Other (See Comments)    Extremity tremors    Social History:   Social History   Socioeconomic History   Marital status: Single    Spouse name: Not on file   Number of children: 0   Years of education: Not on file   Highest education level: Not on file  Occupational History   Occupation: works at [employer] 6   Tobacco Use   Smoking status: Former    Types: Cigars   Smokeless tobacco: Never  Scientific laboratory technician Use: Never used  Substance and Sexual Activity   Alcohol use: No    Alcohol/week: 0.0 standard drinks   Drug use: No   Sexual activity: Yes    Birth  control/protection: None  Other Topics Concern   Not on file  Social History Narrative   Lives with mother    Social Determinants of Health   Financial Resource Strain: Not on file  Food Insecurity: Not on file  Transportation Needs: Not on file  Physical Activity: Not on file  Stress: Not on file  Social Connections: Not on file  Intimate Partner Violence: Not on file    Family History:    Family History  Problem Relation Age of Onset   Hypertension  Mother    Multiple sclerosis Mother    CAD Mother    Cancer Maternal Grandmother        Ovarian    Cancer Maternal Grandfather        Prostate    Kidney disease Father      ROS:  Please see the history of present illness.  All other ROS reviewed and negative.     Physical Exam/Data:   Vitals:   11/28/21 1300 11/28/21 1400 11/28/21 1500 11/28/21 1600  BP:      Pulse:   94 92  Resp: 17 (!) 25 (!) 23 (!) 23  Temp:    (!) 96.5 F (35.8 C)  TempSrc:    Axillary  SpO2: 100% 95% 100% 100%  Weight:      Height:        Intake/Output Summary (Last 24 hours) at 11/28/2021 1715 Last data filed at 11/28/2021 1407 Gross per 24 hour  Intake 16193.61 ml  Output 14545 ml  Net 1648.61 ml   Last 3 Weights 11/28/2021 11/27/2021 11/27/2021  Weight (lbs) 289 lb 14.5 oz 294 lb 1.5 oz 289 lb 0.4 oz  Weight (kg) 131.5 kg 133.4 kg 131.1 kg  VS:  BP (!) 127/103    Pulse (!) 103    Temp (!) 96.5 F (35.8 C) (Axillary)    Resp 14    Ht _0  (1.676 m)    Wt 131.5 kg    SpO2 99%    BMI 46.79 kg/m  , BMI Body mass index is 46.79 kg/m. GENERAL:  Critically ill-appearing.  No acute distress.  HEENT: Pupils equal round and reactive, fundi not visualized, oral mucosa unremarkable NECK:  No jugular venous distention, waveform within normal limits, carotid upstroke brisk and symmetric, no bruits, no thyromegaly LUNGS:  Clear to auscultation bilaterally anteriorly HEART:  RRR.  PMI not displaced or sustained,S1 and S2 within normal limits, no S3,  no S4, no clicks, no rubs, no murmurs ABD:  Flat, positive bowel sounds normal in frequency in pitch, no bruits, no rebound, no guarding, no midline pulsatile mass, no hepatomegaly, no splenomegaly EXT:   trace woody edema.  S/p L TMA SKIN:  Chronic stasis dermatitis NEURO: Unable to assess.  Sedated. PSYCH:  Unable to assess.  Sedated.  EKG:  The EKG was personally reviewed and demonstrates:  sinus rhythm.  Rate 94 bpm.  IVCD.  Low voltage.  Telemetry:  Telemetry was personally reviewed and demonstrates:  sinus rhythm.  Relevant CV Studies:  Echo 11/27/21: IMPRESSIONS     1. Left ventricular ejection fraction, by estimation, is 55 to 60%. The  left ventricle has normal function. The left ventricle has no regional  wall motion abnormalities. There is mild concentric left ventricular  hypertrophy. Left ventricular diastolic  parameters are indeterminate. Elevated left atrial pressure.   2. Right ventricular systolic function is normal. The right ventricular  size is normal.   3. Left atrial size was severely dilated.   4. Restricted anterior and posterior mitral valve leaflet motion.  Compared with the echo in 2017, the mitral valve is now heavily thickened  and clacified. These findings are all new. Could represent endocarditis.  Recommend TEE to better evaluate. The  mitral valve is degenerative. Moderate mitral valve regurgitation. Severe  mitral stenosis. Severe mitral annular calcification.   5. Tricuspid valve regurgitation is moderate to severe.   6. The aortic valve is tricuspid. Aortic valve regurgitation is not  visualized. No aortic stenosis is present.  7. The inferior vena cava is normal in size with greater than 50%  respiratory variability, suggesting right atrial pressure of 3 mmHg.   Echo 02/12/16: Study Conclusions   - Left ventricle: The cavity size was at the upper limits of    normal. There was mild concentric hypertrophy. Systolic function    was normal.  The estimated ejection fraction was in the range of    50% to 55%. Wall motion was normal; there were no regional wall    motion abnormalities. Doppler parameters are consistent with    abnormal left ventricular relaxation (grade 1 diastolic    dysfunction).  - Left atrium: The atrium was mildly dilated.  - Right ventricle: Systolic function was normal.  - Pulmonary arteries: Systolic pressure was within the normal    range.  - Pericardium, extracardiac: A small pericardial effusion was    identified.    Laboratory Data:  High Sensitivity Troponin:  No results for input(s): TROPONINIHS in the last 720 hours.   Chemistry Recent Labs  Lab 11/27/2021 1412 11/27/21 0528 11/28/21 0602  NA 140 138 136  K 3.8 4.1 4.0  CL 94* 94* 93*  CO2 20* 17* 18*  GLUCOSE 122* 98 108*  BUN 65* 67* 67*  CREATININE 11.34* 12.01* 11.50*  CALCIUM 8.3* 8.2* 7.9*  MG  --   --  1.5*  GFRNONAA 4* 4* 4*  ANIONGAP 26* 27* 25*    Recent Labs  Lab 11/19/2021 1412 11/27/21 0528 11/28/21 0602  PROT 7.1 6.6 6.5  ALBUMIN 2.8* 2.7* 2.8*  AST 11* 11* 11*  ALT _0 ALKPHOS 131* 124 120  BILITOT 1.0 0.7 1.0   Lipids  Recent Labs  Lab 12/14/2021 1830  CHOL 125  TRIG 73  HDL 34*  LDLCALC 76  CHOLHDL 3.7    Hematology Recent Labs  Lab 11/18/2021 1412 11/27/21 0528 11/28/21 0602  WBC 16.6* 14.4* 17.4*  RBC 3.00* 2.73* 2.73*  HGB 9.4* 8.6* 8.6*  HCT 29.6* 27.4* 27.4*  MCV 98.7 100.4* 100.4*  MCH 31.3 31.5 31.5  MCHC 31.8 31.4 31.4  RDW 17.9* 17.9* 18.0*  PLT 247 242 253   Thyroid No results for input(s): TSH, FREET4 in the last 168 hours.  BNPNo results for input(s): BNP, PROBNP in the last 168 hours.  DDimer  Recent Labs  Lab 11/25/2021 2222 11/27/21 0528  DDIMER 0.72* 0.61*    Radiology/Studies:  CT HEAD WO CONTRAST (5MM)  Result Date: 11/27/2021 CLINICAL DATA:  Right-sided weakness.  Aphasia. EXAM: CT HEAD WITHOUT CONTRAST TECHNIQUE: Contiguous axial images were obtained from the  base of the skull through the vertex without intravenous contrast. RADIATION DOSE REDUCTION: This exam was performed according to the departmental dose-optimization program which includes automated exposure control, adjustment of the mA and/or kV according to patient size and/or use of iterative reconstruction technique. COMPARISON:  12/13/2021. FINDINGS: Brain: No evidence of acute infarction, hemorrhage, hydrocephalus, extra-axial collection or mass lesion/mass effect. Small infarcts described on the previous day's exam less well-defined due to patient motion on the current study. Vascular: No hyperdense vessel or unexpected calcification. Skull: No fracture. No discrete bone lesion. Large right frontal osteoma, stable. Sinuses/Orbits: Globes and orbits are unremarkable. Sinuses are clear. Other: None. IMPRESSION: 1. No acute intracranial abnormalities. No change from the previous day's study. Electronically Signed   By: Lajean Manes M.D.   On: 11/27/2021 15:15   MR BRAIN WO CONTRAST  Result Date: 11/29/2021 CLINICAL DATA:  Provided history: Neuro  deficit, acute, stroke suspected. EXAM: MRI HEAD WITHOUT CONTRAST TECHNIQUE: Multiplanar, multiecho pulse sequences of the brain and surrounding structures were obtained without intravenous contrast. COMPARISON:  Noncontrast head CT performed immediately prior 12/12/2021. FINDINGS: Brain: An abbreviated protocol examination was performed at the provider's request. The following sequences were acquired: Axial and coronal diffusion-weighted imaging, axial T2 FLAIR sequence and axial SWI sequence. The axial T2 FLAIR sequence is moderately motion degraded, limiting evaluation. Subcentimeter acute cortically-based infarct within the posterior left frontal lobe (series 5, image 35). Subcentimeter acute cortically-based infarct within the lateral right frontal lobe (at the anterior aspect of the right sylvian fissure)(series 5, image 28). Patchy small acute infarcts within  the callosal splenium on the right. 12 mm focus of diffusion-weighted hyperintensity within the mid left frontal lobe white matter (series 5, image 32). No definite ADC correlate is appreciated. However, this finding is present on both the axial and coronal diffusion-weighted sequences, and this may reflect a recent infarct. Similarly, there is an ill-defined focus of diffusion-weighted hyperintensity within the left frontoparietal periventricular white matter measuring 10 mm (series 5, image 31). No definite ADC correlate is appreciated. However, this finding is present on both the axial and coronal diffusion-weighted sequences, and this may reflect a recent infarct. Small chronic cortical/subcortical infarct within the right parietooccipital lobes. Chronic lacunar infarct within the left caudate nucleus with associated chronic hemosiderin deposition at this site. Multifocal T2 FLAIR hyperintense signal abnormality within the cerebral white matter, nonspecific but compatible with chronic small vessel ischemic disease. These signal changes are overall mild, but advanced for age. No evidence of an intracranial mass or extra-axial fluid collection. No midline shift. Vascular: Flow voids poorly assessed in the absence of T2 TSE imaging. Skull and upper cervical spine: 1.9 cm bony protuberance projecting outward from the right frontal calvarium, likely reflecting an osteoma. Sinuses/Orbits: No acute orbital finding. Mild mucosal thickening within the bilateral ethmoid sinuses. Other: Trapped fluid within the left petrous apex. Impression #2 called by telephone at the time of interpretation on 12/10/2021 at 2:05 pm to provider [facility] , who verbally acknowledged these results. IMPRESSION: 1. Abbreviated protocol motion degraded examination, as described. 2. Subcentimeter acute cortically-based infarcts within the lateral right frontal lobe and posterior left frontal lobe. Patchy small acute infarcts within the  callosal splenium on the right. Additional small recent infarcts (measuring up to 12 mm) are questioned within the left frontoparietal white matter. Given small infarcts involving multiple vascular territories, there is suspicion for an embolic process. 3. Small chronic cortical/subcortical infarct within the right parietooccipital lobes. 4. Chronic hemorrhagic lacunar infarct within the left caudate nucleus. 5. Background chronic small-vessel ischemic changes within the cerebral white matter, mild but advanced for age. 6. 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 7. Mild mucosal thickening within the bilateral ethmoid sinuses. 8. Trapped fluid within the left petrous apex. Electronically Signed   By: Kellie Simmering D.O.   On: 11/17/2021 14:31   DG Chest Port 1 View  Result Date: 11/27/2021 CLINICAL DATA:  Hypoxia. EXAM: PORTABLE CHEST 1 VIEW COMPARISON:  12/13/2021 FINDINGS: Cardiac silhouette mildly enlarged.  No mediastinal or hilar masses. Subtle hazy airspace opacity suggested in the left mid to upper lung. Remainder of the lungs is clear. No convincing pleural effusion or pneumothorax. Skeletal structures are grossly intact. IMPRESSION: 1. Subtle hazy airspace adjusted in the left mid to upper lung, which appears to be a change from the previous day's exam. This is consistent with pneumonia.  Electronically Signed   By: Lajean Manes M.D.   On: 11/27/2021 15:17   DG Chest Port 1 View  Result Date: 11/25/2021 CLINICAL DATA:  Weakness EXAM: PORTABLE CHEST 1 VIEW COMPARISON:  07/07/2021 FINDINGS: Stable cardiomegaly. Mild pulmonary vascular congestion. No overt pulmonary edema. No focal airspace consolidation. No pleural effusion or pneumothorax. IMPRESSION: Cardiomegaly with mild pulmonary vascular congestion. Electronically Signed   By: Davina Poke D.O.   On: 11/23/2021 18:38   DG Foot 2 Views Left  Result Date: 11/17/2021 CLINICAL DATA:  Pain EXAM: LEFT FOOT - 2 VIEW  COMPARISON:  09/15/2021 FINDINGS: Postsurgical changes of transmetatarsal amputation of the first through fifth rays. There is new bony irregularity along the resection margins of the third, fourth, and fifth metatarsals. No acute fracture. No dislocation. No deep soft tissue ulceration is evident radiographically. Severe small vessel atherosclerotic calcifications. 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 New bony irregularity along the resection margins of the third, fourth, and fifth metatarsals concerning for osteomyelitis. Electronically Signed   By: Davina Poke D.O.   On: 12/03/2021 18:37   ECHOCARDIOGRAM COMPLETE  Result Date: 11/27/2021    ECHOCARDIOGRAM REPORT   Patient Name:   SHANDRIA CLINCH Date of Exam: 11/27/2021 Medical Rec #:  [id number]         Height:       66.0 in Accession #:    [id number]        Weight:       289.0 lb Date of Birth:  11/24/1985         BSA:          2.339 m Patient Age:    71 years          BP:           104/91 mmHg Patient Gender: F                 HR:           107 bpm. Exam Location:  [facility] Procedure: 2D Echo Indications:     Stroke I63.9  History:         Patient has prior history of Echocardiogram examinations, most                  recent 02/12/2016.  Sonographer:     Kathlen Brunswick RDCS Referring Phys:  [place] Diagnosing Phys: Skeet Latch MD  Sonographer Comments: Image acquisition challenging due to uncooperative patient. Non-verbal uncooperative patient. IMPRESSIONS  1. Left ventricular ejection fraction, by estimation, is 55 to 60%. The left ventricle has normal function. The left ventricle has no regional wall motion abnormalities. There is mild concentric left ventricular hypertrophy. Left ventricular diastolic parameters are indeterminate. Elevated left atrial pressure.  2. Right ventricular systolic function is normal. The right ventricular size is normal.  3. Left atrial size  was severely dilated.  4. Restricted anterior and posterior mitral valve leaflet motion. Compared with the echo in 2017, the mitral valve is now heavily thickened and clacified. These findings are all new. Could represent endocarditis. Recommend TEE to better evaluate. The mitral valve is degenerative. Moderate mitral valve regurgitation. Severe mitral stenosis. Severe mitral annular calcification.  5. Tricuspid valve regurgitation is moderate to severe.  6. The aortic valve is tricuspid. Aortic valve regurgitation is not visualized. No aortic stenosis is present.  7. The inferior vena cava is normal in size with greater than 50% respiratory variability, suggesting right  atrial pressure of 3 mmHg. FINDINGS  Left Ventricle: Left ventricular ejection fraction, by estimation, is 55 to 60%. The left ventricle has normal function. The left ventricle has no regional wall motion abnormalities. The left ventricular internal cavity size was normal in size. There is  mild concentric left ventricular hypertrophy. Left ventricular diastolic parameters are indeterminate. Elevated left atrial pressure. Right Ventricle: The right ventricular size is normal. No increase in right ventricular wall thickness. Right ventricular systolic function is normal. Left Atrium: Left atrial size was severely dilated. Right Atrium: Right atrial size was normal in size. Pericardium: There is no evidence of pericardial effusion. Mitral Valve: Restricted anterior and posterior mitral valve leaflet motion. Compared with the echo in 2017, the mitral valve is now heavily thickened and clacified. These findings are all new. Could represent endocarditis. Recommend TEE to better evaluate. The mitral valve is degenerative in appearance. There is severe thickening of the anterior mitral valve leaflet(s). There is severe calcification of the anterior mitral valve leaflet(s). Severe mitral annular calcification. Moderate mitral valve regurgitation. Severe  mitral valve stenosis. MV peak gradient, 24.8 mmHg. The mean mitral valve gradient is 15.0 mmHg. Tricuspid Valve: The tricuspid valve is normal in structure. Tricuspid valve regurgitation is moderate to severe. No evidence of tricuspid stenosis. Aortic Valve: The aortic valve is tricuspid. Aortic valve regurgitation is not visualized. No aortic stenosis is present. Aortic valve peak gradient measures 6.0 mmHg. Pulmonic Valve: The pulmonic valve was normal in structure. Pulmonic valve regurgitation is not visualized. No evidence of pulmonic stenosis. Aorta: The aortic root is normal in size and structure. Venous: The inferior vena cava is normal in size with greater than 50% respiratory variability, suggesting right atrial pressure of 3 mmHg. IAS/Shunts: No atrial level shunt detected by color flow Doppler.  LEFT VENTRICLE PLAX 2D LVIDd:         4.94 cm      Diastology LVIDs:         3.45 cm      LV e' medial:    6.42 cm/s LV PW:         1.30 cm      LV E/e' medial:  30.8 LV IVS:        1.22 cm      LV e' lateral:   5.44 cm/s LVOT diam:     2.00 cm      LV E/e' lateral: 36.4 LV SV:         43 LV SV Index:   18 LVOT Area:     3.14 cm  LV Volumes (MOD) LV vol d, MOD A4C: 105.0 ml LV vol s, MOD A4C: 33.0 ml LV SV MOD A4C:     105.0 ml RIGHT VENTRICLE RV Basal diam:  3.61 cm RV S prime:     11.10 cm/s TAPSE (M-mode): 1.5 cm LEFT ATRIUM              Index        RIGHT ATRIUM           Index LA diam:        3.70 cm  1.58 cm/m   RA Area:     18.10 cm LA Vol (A2C):   60.0 ml  25.66 ml/m  RA Volume:   46.90 ml  20.06 ml/m LA Vol (A4C):   100.0 ml 42.76 ml/m LA Biplane Vol: 80.5 ml  34.42 ml/m  AORTIC VALVE AV Area (Vmax): 2.32 cm AV Vmax:  122.00 cm/s AV Peak Grad:   6.0 mmHg LVOT Vmax:      90.10 cm/s LVOT Vmean:     62.400 cm/s LVOT VTI:       0.137 m  AORTA Ao Root diam: 2.40 cm Ao Asc diam:  2.90 cm MITRAL VALVE                TRICUSPID VALVE MV Area (PHT): 1.80 cm     TV Peak grad:   53.1 mmHg MV Area VTI:    0.91 cm     TV Vmax:        3.64 m/s MV Peak grad:  24.8 mmHg    TR Peak grad:   54.8 mmHg MV Mean grad:  15.0 mmHg    TR Vmax:        370.00 cm/s MV Vmax:       2.49 m/s MV Vmean:      182.5 cm/s   SHUNTS MV Decel Time: 421 msec     Systemic VTI:  0.14 m MR Peak grad: 86.5 mmHg     Systemic Diam: 2.00 cm MR Mean grad: 56.0 mmHg MR Vmax:      465.00 cm/s MR Vmean:     352.0 cm/s MV E velocity: 198.00 cm/s Skeet Latch MD Electronically signed by Skeet Latch MD Signature Date/Time: 11/27/2021/3:04:41 PM    Final    CT HEAD CODE STROKE WO CONTRAST  Result Date: 12/10/2021 CLINICAL DATA:  Code stroke. Provided history: Neuro deficit, acute, stroke suspected. Additional history provided: Aphasia, left-sided weakness. Last known normal 11:30 a.m. EXAM: CT HEAD WITHOUT CONTRAST TECHNIQUE: Contiguous axial images were obtained from the base of the skull through the vertex without intravenous contrast. RADIATION DOSE REDUCTION: This exam was performed according to the departmental dose-optimization program which includes automated exposure control, adjustment of the mA and/or kV according to patient size and/or use of iterative reconstruction technique. COMPARISON:  Report from head CT 02/21/2002 (images unavailable). FINDINGS: Brain: Mild generalized cerebral atrophy, advanced for age. Small chronic cortical/subcortical infarct within the right parietooccipital lobes. Patchy and ill-defined hypoattenuation within the cerebral white matter, nonspecific but compatible with chronic small vessel ischemic disease. Findings are overall mild, but advanced for age. Small age-indeterminate lacunar infarct within the anterior limb of right internal capsule (series 3, image 18) (series 5, image 32). 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 Scattered supratentorial calcifications, some many of which appear parenchymal. There is no acute intracranial hemorrhage. No acute demarcated cortical infarct is  identified. No extra-axial fluid collection. No evidence of an intracranial mass. No midline shift. Vascular: No hyperdense vessel. Atherosclerotic calcifications. Skull: No calvarial fracture. Bony protuberance projecting outward from the right frontal calvarium, measuring 1.9 x 1.1 cm in transaxial dimensions, compatible with an osteoma (series 4, image 42). Sinuses/Orbits: Visualized orbits show no acute finding. No significant paranasal sinus disease. ASPECTS ([place] Stroke Program Early CT Score) - Ganglionic level infarction (caudate, lentiform nuclei, internal capsule, insula, M1-M3 cortex): 6 - Supraganglionic infarction (M4-M6 cortex): 3 Total score (0-10 with 10 being normal): 9 Impressions #1 and #2 were communicated to Dr. Rory Percy at 1:50 pmon 12/09/2021 by text page via the [facility] messaging system. IMPRESSION: 1. No acute intracranial hemorrhage or acute demarcated cortical infarct. 2. Age-indeterminate lacunar infarct within the anterior limb of right internal capsule. 3. Small chronic cortical/subcortical infarct within the right parietooccipital lobes. 4. 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 5. Chronic small-vessel ischemic changes within the cerebral white matter,  overall mild but advanced for age. 6. Scattered punctate supratentorial parenchymal calcifications. Findings are nonspecific, but may be seen as sequela of neurocysticercosis. 7. Mild cerebral atrophy, advanced for age. 8. 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 Electronically Signed   By: Kellie Simmering D.O.   On: 12/13/2021 13:51     Assessment and Plan:   # Severe mitral stenosis:  # Likely endocarditis:  Ms. Cimo's mitral valve is grossly abnormal.  There is a significant change since her echo in 2017.  At that time she had no mitral valvular disease.  I suspect that this is endocarditis and it is causing embolic infarcts.  It could also be severe MAC in the setting of ESRD.   Thrombosis seems less likely. She has a persistent leukocytosis.  Blood cultures are in process and she is on broad spectrum antibiotics.  She needs a TEE.  The best timing of this is unclear.  I am concerned that she became so hypoxic after laying flat for CT and when getting carotid Dopplers.  She also remains encephalopathic and I suspect CT surgery would not consider her a surgical candidate without at least neurologic recover.  Now on Cefepime, Vanc and Gentamycin.  Recommend continuing her antibiotics and getting a TEE once more medically stable.  Though she is on RA or 2L, she seems to desaturate very quickly and is at very high risk for requiring intubation for the procedure.  There were new pulmonary infiltrates on CXR after she desatted in CT. Continue heparin and IV antibiotics.   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  # COVID-19: Reportedly incidental finding.  She is on Remdesivir.  # PAD:  High risk for amputation.  Follows with vascular surgery.    # ESRD: On PD.  No evidence of peritonitis.  Total critical care time: 60 minutes. Critical care time was exclusive of separately billable procedures and treating other patients. Critical care was necessary to treat or prevent imminent or life-threatening deterioration. Critical care was time spent personally by me on the following activities: development of treatment plan with patient and/or surrogate as well as nursing, discussions with consultants, evaluation of patient's response to treatment, examination of patient, obtaining history from patient or surrogate, ordering and performing treatments and interventions, ordering and review of laboratory studies, ordering and review of radiographic studies, pulse oximetry and re-evaluation of patient's condition.  Risk Assessment/Risk Scores:       For questions or updates, please contact [place] Please consult [email or web address] for contact info under     Signed, Skeet Latch, MD  11/28/2021 5:15 PM

## 2021-11-28 NOTE — Progress Notes (Signed)
SLP Cancellation Note  Patient Details Name: [name] MRN: [id number] DOB: Jul 15, 1986   Cancelled treatment:       Reason Eval/Treat Not Completed: Fatigue/lethargy limiting ability to participate;Patient's level of consciousness   Spoke with RN. Per RN, pt not appropriate for clinical swallowing evaluation given decreased LOA / overall mental status. SLP to defer clinical swallowing evaluation pending improvements in pt's ability to safely take POs.  Cherrie Gauze, M.S., [facility] [phone] [address])   Quintella Baton 11/28/2021, 8:37 AM

## 2021-11-28 NOTE — Plan of Care (Signed)
Continuing with plan of care. 

## 2021-11-28 NOTE — Progress Notes (Addendum)
NAME:  [name], MRN:  [id number], DOB:  12-19-1985, LOS: 2 ADMISSION DATE:  12/11/2021, CONSULTATION DATE:  11/27/21 REFERRING MD:  Dr. Loleta Books, CHIEF COMPLAINT:  CODE STROKE   History of Present Illness:  36 yo F presenting to [facility] ED with complaints of difficulty speaking & left sided hemiparesis around 11:30 AM on 11/25/2021.  (All information gathered from chart review and attending/EDP documentation as patient is unable to participate in interview and there is no family at bedside.) Patient was originally scheduled for a vascular procedure on 11/25/2021 and prior to arriving she had taken antihypertensives at which point her mother states the patient became confused and had difficulty speaking as well as difficulty moving her left side.   ED course: CODE STROKE initiated patient evaluated by neurology.  Patient speech had improved as well as left-sided weakness, however she remains encephalopathic. MRI showed acute and subacute CVA in multiple vascular territories and the patient was admitted by [facility] for further stroke work-up.  It was also noted on admission the patient was pulseless in the right foot for which vascular surgery was consulted. medications given: Patient was started on heparin drip per vascular surgery recommendation of which neurology is aware. Initial Vitals: Afebrile at 98.8, mildly tachypneic at 21, NSR at 88, BP soft 85/74 > 95/74 with SPO2 88% on 2 L nasal cannula. Significant labs: (Labs/ Imaging personally reviewed) I, Domingo Pulse [place], AGACNP-BC, personally viewed and interpreted this ECG. 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 new bony irregularity along the resection margins of the third, fourth and fifth metatarsals concerning for osteomyelitis MRI brain without contrast 11/30/2021: Subcentimeter acute cortically based infarcts with the lateral right frontal lobe and posterior left frontal lobe. patchy small acute infarcts within the callosal splenium on the right additional small recent infarcts (measuring up to 12 mm) are questioned within the left frontoparietal white matter given the small infarcts involving multiple vascular territories there is a suspicion for embolic process. small chronic cortical and subcortical infarct within the right parietoccipital lobes.  Chronic hemorrhagic lacunar infarct within the left caudate nucleus.  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  Mild mucosal thickening within the bilateral ethmoid sinuses.  Trapped fluid within the left petrous apex.  Hospital course: [facility] consulted for admission and neurology consulted for acute /subacute multiple infarcts.  Nephrology consulted due to peritoneal dialysis requirements.  Vascular surgery consulted for assistance with right lower extremity ischemia.  Patient was started on heparin drip with additional imaging and work-up for acute and subacute CVA.  Antibiotics started for concerns of osteomyelitis in the foot.  Echocardiogram 11/27/2021: LVEF 55 to 60%, no regional wall motion abnormalities, mild concentric LVH, elevated LA pressure, RV systolic function is normal, LA size severely dilated. Restricted anterior and posterior mitral valve leaflet motion compared with the echo in 2017 with concern for endocarditis. Moderate MR, Severe MS, moderate-severe TR, no AS.   11/27/21: Early in the day neurology  documented the patient has aphasic but inact in terms of following  commands, with improved left sided hemiparesis and new right arm drift. Per TRH documentation, patient received Dilaudid for severe pain in ischemic LE, then waking up from a nap more confused with more Right sided hemiparesis. STAT CTH ordered. While in CT patient became acutely hypoxic when laid flat, desaturations in the 70's. Rapid response called and patient transferred to SDU for monitoring. CT head wo contrast 11/27/21: No new intracranial abnormalities  PCCM consulted for additional management and monitoring while in SDU.   11/28/21- patient would benefit from cardiothoracic surgery evaluation in lieu of possible endocarditis.  She also has severe comorbid conditions that require multiple specialists involved. Together the entire medical team reviewed plan and agreement to transfer to [facility] for additional evaluation and management. I reviewed medical plan with family including mother Willine Schwalbe.   Pertinent  Medical History  PAD Anemia of chronic disease T2DM Asthma ESRD on PD HFpEF HTN Obesity calciphylaxis  Significant Hospital Events: Including procedures, antibiotic start and stop dates in addition to other pertinent events   12/03/2021: Admit to inpatient as CODE STROKE with multiple acute/subacute infarcts on MRI, ESRD on PD, acute RLE ischemia & suspected osteomyelitis on L foot with Neurology, Nephrology & Vascular surgery following 11/27/21: Echo concerning for endocarditis, new confusion/right sided hemiparesis-STAT CT ordered, but when patient laid flat became hypoxic- rapid response to SDU with PCCM consult.  Interim History / Subjective:  Patient drowsy at first, then moaning and responsive to painful stimuli. Non-verbal/aphasic, moves all extremities, does not appear to be following commands. Per nursing and previous documentation she had been even more lethargic prior to PD initiation. Otherwise  mentation has been unchanged since this afternoon.  Objective   Blood pressure (!) 95/52, pulse (!) 104, temperature (!) 96.4 F (35.8 C), temperature source Axillary, resp. rate (!) 27, height 5' 6"  (1.676 m), weight 131.5 kg, SpO2 91 %.        Intake/Output Summary (Last 24 hours) at 11/28/2021 0931 Last data filed at 11/28/2021 6160 Gross per 24 hour  Intake 16691.15 ml  Output 14545 ml  Net 2146.15 ml    Filed Weights   11/27/21 0100 11/27/21 1628 11/28/21 0500  Weight: 131.1 kg 133.4 kg 131.5 kg    Examination: General: Adult female, acutely ill, lying in bed, NAD HEENT: MM pink/moist, anicteric, atraumatic, neck supple Neuro: RASS: - 2, opens eyes to her name, only verbalizing in moans, withdraws to pain in all extremities, unable to follow commands, PERRL +3, MAE CV: s1s2 RRR, NSR on monitor, no r/m/g Pulm: Regular, non labored on room air, breath sounds diminished throughout GI: soft, rounded, non tender, bs x 4 Skin: no rashes/lesions noted Extremities: warm/dry, pulses + 2 R/P, no edema noted  Resolved Hospital Problem list     Assessment & Plan:  Multiple Acute/subacute infarcts  PMHx: HLD, HTN - modified NIHSS Q 4 - NIHSS Q shift - SLP/PT/OT consulted - Neurology following, appreciate input - MRA, carotid US pending - ASA suppository & Lipitor ordered per primary service  Leukocytosis Suspected Osteomyelitis Query Endocarditis - continue cefepime & vancomycin - will need TEE > defer to primary service - daily CBC, monitor WBC/fever curve - f/u cultures - consider ID consult - continuous cardiac monitoring - BP evaluation inconsistent, concern regarding accurate readings. BP in ICU soft but MAP > 65. PCCM contacted regarding arterial line placement. Discussed with Dr. Lanney Gins that in the setting of severe PAD in BLE and steal syndrome with old fistula would defer any central  line placement to vascular surgery if needed. Dr. Lanney Gins in agreement with  plan recommending that overnight will treat based on clinical appearance.  Type 2 Diabetes Mellitus Controlled, HgbA1c: 5.1  - Q 4 CBG monitoring - SSI not added at this time, NPO CBG WNL - target CBG range 140-180 - follow ICU hypo/hyper-glycemia protocol  Acute Hypoxic Respiratory Failure - resolved COVID-19 infection  Patient currently on room air, not hypoxic. Suspect secondary to positioning for CT and body habitus - Supplemental O2 to maintain SpO2 > 90% - Intermittent chest x-ray & ABG PRN - Ensure adequate pulmonary hygiene  - Continue Remdesivir - bronchodilators PRN  ESRD on PD - nephrology following, appreciate input - daily BMP, replace electrolytes PRN - strict I&O's  Ischemic RLE in the setting of severe PAD - vascular surgery following, appreciate input - on heparin drip per pharmacy protocol  Best Practice (right click and "Reselect all SmartList Selections" daily)  Diet/type: NPO DVT prophylaxis: systemic heparin GI prophylaxis: PPI Lines: N/A Foley:  N/A Code Status:  full code Last date of multidisciplinary goals of care discussion [per primary service]  Labs   CBC: Recent Labs  Lab 11/24/21 1005 11/25/2021 1320 12/13/2021 1412 11/27/21 0528 11/28/21 0602  WBC 16.9* 17.2* 16.6* 14.4* 17.4*  NEUTROABS  --  14.0* 14.0* 11.8*  --   HGB 9.1* 9.2* 9.4* 8.6* 8.6*  HCT 28.0* 28.4* 29.6* 27.4* 27.4*  MCV 95.6 99.6 98.7 100.4* 100.4*  PLT 253 280 247 242 253     Basic Metabolic Panel: Recent Labs  Lab 11/24/21 1005 11/19/2021 1412 11/27/21 0528 11/28/21 0602  NA 136 140 138 136  K 3.1* 3.8 4.1 4.0  CL 94* 94* 94* 93*  CO2 19* 20* 17* 18*  GLUCOSE 110* 122* 98 108*  BUN 69* 65* 67* 67*  CREATININE 12.79* 11.34* 12.01* 11.50*  CALCIUM 7.8* 8.3* 8.2* 7.9*  MG  --   --   --  1.5*  PHOS  --   --   --  10.0*    GFR: Estimated Creatinine Clearance: 9.4 mL/min (A) (by C-G formula based on SCr of 11.5 mg/dL (H)). Recent Labs  Lab 11/22/2021 1320  11/25/2021 1412 11/27/21 0528 11/28/21 0602  WBC 17.2* 16.6* 14.4* 17.4*     Liver Function Tests: Recent Labs  Lab 12/12/2021 1412 11/27/21 0528 11/28/21 0602  AST 11* 11* 11*  ALT 7 6 6   ALKPHOS 131* 124 120  BILITOT 1.0 0.7 1.0  PROT 7.1 6.6 6.5  ALBUMIN 2.8* 2.7* 2.8*    No results for input(s): LIPASE, AMYLASE in the last 168 hours. No results for input(s): AMMONIA in the last 168 hours.  ABG    Component Value Date/Time   PHART 7.32 (L) 11/27/2021 1449   PCO2ART 26 (L) 11/27/2021 1449   PO2ART 106 11/27/2021 1449   HCO3 13.4 (L) 11/27/2021 1449   TCO2 26 09/08/2021 1418   ACIDBASEDEF 11.3 (H) 11/27/2021 1449   O2SAT 97.6 11/27/2021 1449      Coagulation Profile: Recent Labs  Lab 11/29/2021 2222  INR 1.3*     Cardiac Enzymes: No results for input(s): CKTOTAL, CKMB, CKMBINDEX, TROPONINI in the last 168 hours.  HbA1C: Hgb A1c MFr Bld  Date/Time Value Ref Range Status  11/22/2021 06:30 PM 5.1 4.8 - 5.6 % Final    Comment:    (NOTE) Pre diabetes:          5.7%-6.4%  Diabetes:              >  6.4%  Glycemic control for   <7.0% adults with diabetes   11/24/2021 10:05 AM 5.0 4.8 - 5.6 % Final    Comment:    (NOTE) Pre diabetes:          5.7%-6.4%  Diabetes:              >6.4%  Glycemic control for   <7.0% adults with diabetes     CBG: Recent Labs  Lab 11/27/21 1157 11/27/21 1629 11/27/21 1902 11/28/21 0034 11/28/21 0524  GLUCAP 78 75 81 108* 88     Review of Systems:   UTA- patient unable to participate in interview   Past Medical History:  She,  has a past medical history of Anemia of chronic disease, Asthma, Calciphylaxis (2020), CHF (congestive heart failure) ([place]), CKD (chronic kidney disease), stage III ([place]), Diabetes ([place]), Dyspnea, Hypertension, Morbid obesity ([place]), Nephrotic syndrome, PAD (peripheral artery disease) ([place]), and Sepsis ([place]) (06/2021).   Surgical History:   Past Surgical History:  Procedure Laterality Date    A/V FISTULAGRAM Left 10/29/2020   Procedure: A/V FISTULAGRAM;  Surgeon: Algernon Huxley, MD;  Location: [place] [facility];  Service: Cardiovascular;  Laterality: Left;   APPLICATION OF WOUND VAC Bilateral 09/15/2019   Procedure: APPLICATION OF WOUND VAC;  Surgeon: Benjamine Sprague, DO;  Location: [facility];  Service: General;  Laterality: Bilateral;   AV FISTULA PLACEMENT Left 07/09/2020   Procedure: ARTERIOVENOUS (AV) FISTULA CREATION ( BRACHIAL CEPHALIC);  Surgeon: Algernon Huxley, MD;  Location: [facility];  Service: Vascular;  Laterality: Left;   DIALYSIS/PERMA CATHETER INSERTION N/A 02/28/2019   Procedure: DIALYSIS/PERMA CATHETER INSERTION;  Surgeon: Algernon Huxley, MD;  Location: [place] [facility];  Service: Cardiovascular;  Laterality: N/A;   DIALYSIS/PERMA CATHETER INSERTION N/A 09/17/2019   Procedure: DIALYSIS/PERMA CATHETER EXCHANGE;  Surgeon: Katha Cabal, MD;  Location: [place] [facility];  Service: Cardiovascular;  Laterality: N/A;   DIALYSIS/PERMA CATHETER REMOVAL N/A 03/18/2020   Procedure: DIALYSIS/PERMA CATHETER REMOVAL;  Surgeon: Algernon Huxley, MD;  Location: [place] [facility];  Service: Cardiovascular;  Laterality: N/A;   LIGATION OF ARTERIOVENOUS  FISTULA Left 06/03/2021   Procedure: LIGATION OF ARTERIOVENOUS  FISTULA;  Surgeon: Algernon Huxley, MD;  Location: [facility];  Service: Vascular;  Laterality: Left;   LOWER EXTREMITY ANGIOGRAPHY Left 12/02/2020   Procedure: LOWER EXTREMITY ANGIOGRAPHY;  Surgeon: Algernon Huxley, MD;  Location: [place] [facility];  Service: Cardiovascular;  Laterality: Left;   LOWER EXTREMITY ANGIOGRAPHY Right 01/25/2021   Procedure: LOWER EXTREMITY ANGIOGRAPHY;  Surgeon: Algernon Huxley, MD;  Location: [place] [facility];  Service: Cardiovascular;  Laterality: Right;   LOWER EXTREMITY ANGIOGRAPHY Left 04/15/2021   Procedure: LOWER EXTREMITY ANGIOGRAPHY;  Surgeon: Algernon Huxley, MD;  Location: [place] [facility];  Service: Cardiovascular;  Laterality: Left;    LOWER EXTREMITY ANGIOGRAPHY Right 05/20/2021   Procedure: LOWER EXTREMITY ANGIOGRAPHY;  Surgeon: Algernon Huxley, MD;  Location: [place] [facility];  Service: Cardiovascular;  Laterality: Right;   LOWER EXTREMITY ANGIOGRAPHY Left 05/27/2021   Procedure: LOWER EXTREMITY ANGIOGRAPHY;  Surgeon: Algernon Huxley, MD;  Location: [place] [facility];  Service: Cardiovascular;  Laterality: Left;   LOWER EXTREMITY ANGIOGRAPHY Right 08/04/2021   Procedure: LOWER EXTREMITY ANGIOGRAPHY;  Surgeon: Algernon Huxley, MD;  Location: [place] [facility];  Service: Cardiovascular;  Laterality: Right;   LOWER EXTREMITY ANGIOGRAPHY Left 11/15/2021   Procedure: LOWER EXTREMITY ANGIOGRAPHY;  Surgeon: Algernon Huxley, MD;  Location: [place] [facility];  Service: Cardiovascular;  Laterality: Left;   pd cath  05/21/2019   TENDON LENGTHENING Left 09/08/2021   Procedure: TENDON ACHILLES LENGTHENING;  Surgeon: Criselda Peaches, DPM;  Location: [facility];  Service: Podiatry;  Laterality: Left;   TONSILLECTOMY     TRANSMETATARSAL AMPUTATION Left 09/08/2021   Procedure: TRANSMETATARSAL AMPUTATION;  Surgeon: Criselda Peaches, DPM;  Location: [facility];  Service: Podiatry;  Laterality: Left;   UPPER EXTREMITY ANGIOGRAPHY Left 04/15/2021   Procedure: UPPER EXTREMITY ANGIOGRAPHY;  Surgeon: Algernon Huxley, MD;  Location: [place] [facility];  Service: Cardiovascular;  Laterality: Left;   WOUND DEBRIDEMENT Bilateral 09/15/2019   Procedure: DEBRIDEMENT WOUND;  Surgeon: Benjamine Sprague, DO;  Location: [facility];  Service: General;  Laterality: Bilateral;     Social History:   reports that she has quit smoking. Her smoking use included cigars. She has never used smokeless tobacco. She reports that she does not drink alcohol and does not use drugs.   Family History:  Her family history includes CAD in her mother; Cancer in her maternal grandfather and maternal grandmother; Hypertension in her mother; Kidney disease in her father; Multiple sclerosis in  her mother.   Allergies Allergies  Allergen Reactions   Percocet [Oxycodone-Acetaminophen] Itching   Tomato Itching   Gabapentin Other (See Comments)    Extremity tremors     Home Medications  Prior to Admission medications   Medication Sig Start Date End Date Taking? Authorizing Provider  amLODipine (NORVASC) 10 MG tablet Take 10 mg by mouth daily as needed (elevated blood pressure).   Yes [provider]  aspirin EC 81 MG EC tablet Take 1 tablet (81 mg total) by mouth daily. 02/16/16  Yes Demetrios Loll, MD  atorvastatin (LIPITOR) 20 MG tablet Take 1 tablet (20 mg total) by mouth daily. 11/15/21  Yes Dew, Erskine Squibb, MD  carvedilol (COREG) 25 MG tablet Take 25 mg by mouth 2 (two) times daily as needed (elevated blood pressure).   Yes [provider]  clopidogrel (PLAVIX) 75 MG tablet Take 1 tablet (75 mg total) by mouth daily. 11/15/21  Yes Dew, Erskine Squibb, MD  isosorbide mononitrate (IMDUR) 30 MG 24 hr tablet Take 30 mg by mouth 2 (two) times daily as needed (elevated blood pressure).   Yes [provider]  losartan (COZAAR) 100 MG tablet Take 100 mg by mouth daily as needed (elevated blood pressure).   Yes [provider]  cilostazol (PLETAL) 100 MG tablet Take 1 tablet (100 mg total) by mouth 2 (two) times daily before a meal. Patient not taking: Reported on 12/07/2021 05/07/21   Kris Hartmann, NP  lactulose (CHRONULAC) 10 GM/15ML solution Take 30 mLs (20 g total) by mouth 2 (two) times daily as needed for mild constipation. Patient not taking: Reported on 11/24/2021 07/09/21   Edwin Dada, MD  multivitamin (RENA-VIT) TABS tablet Take 1 tablet by mouth at bedtime. Patient not taking: Reported on 11/15/2021 09/18/19   Fritzi Mandes, MD  ondansetron (ZOFRAN) 4 MG tablet Take 1 tablet (4 mg total) by mouth every 6 (six) hours as needed for nausea. Patient not taking: Reported on 11/09/2021 09/08/21   Criselda Peaches, DPM     Critical care provider  statement:   Total critical care time: 33 minutes   Performed by: Lanney Gins MD   Critical care time was exclusive of separately billable procedures and treating other patients.   Critical care was necessary to treat or  prevent imminent or life-threatening deterioration.   Critical care was time spent personally by me on the following activities: development of treatment plan with patient and/or surrogate as well as nursing, discussions with consultants, evaluation of patient's response to treatment, examination of patient, obtaining history from patient or surrogate, ordering and performing treatments and interventions, ordering and review of laboratory studies, ordering and review of radiographic studies, pulse oximetry and re-evaluation of patient's condition.    Ottie Glazier, M.D.  Pulmonary & Critical Care Medicine

## 2021-11-28 NOTE — Progress Notes (Signed)
Earlier in the shift A-line was inserted by Dr. Shelia Media and patient tolerated well.  Consent was obtained and time-out done.

## 2021-11-28 NOTE — Progress Notes (Signed)
Lorriane Shire, RN called @ [phone] and notified that PD is complete. She stated that she will be here shortly to disconnect.

## 2021-11-28 NOTE — Consult Note (Signed)
[place] for heparin Indication:  Thrombus  Allergies  Allergen Reactions   Percocet [Oxycodone-Acetaminophen] Itching   Tomato Itching   Gabapentin Other (See Comments)    Extremity tremors    Patient Measurements: Height: 5\' 6"  (167.6 cm) Weight: 131.5 kg (289 lb 14.5 oz) IBW/kg (Calculated) : 59.3 Heparin Dosing Weight: 92.3 Kg  Vital Signs: Temp: 96.4 F (35.8 C) (02/12 0036) Temp Source: Axillary (02/12 0036) BP: 95/52 (02/12 0600) Pulse Rate: 96 (02/12 0600)  Labs: Recent Labs    12/03/2021 1412 11/17/2021 1412 11/22/2021 2222 11/27/21 0528 11/27/21 0906 11/27/21 2048 11/28/21 0602  HGB 9.4*  --   --  8.6*  --   --  8.6*  HCT 29.6*  --   --  27.4*  --   --  27.4*  PLT 247  --   --  242  --   --  253  APTT  --   --  38*  --   --   --   --   LABPROT  --   --  16.3*  --   --   --   --   INR  --   --  1.3*  --   --   --   --   HEPARINUNFRC  --    < > <0.10*  --  0.12* 0.22* 0.44  CREATININE 11.34*  --   --  12.01*  --   --  11.50*   < > = values in this interval not displayed.     Estimated Creatinine Clearance: 9.4 mL/min (A) (by C-G formula based on SCr of 11.5 mg/dL (H)).   Medical History: Past Medical History:  Diagnosis Date   Anemia of chronic disease    Asthma    well controlled-no inhaler   Calciphylaxis 2020   CHF (congestive heart failure) ([place])    CKD (chronic kidney disease), stage III ([place])    Diabetes ([place])    type 2-diet controlled   Dyspnea    Hypertension    Morbid obesity ([place])    Nephrotic syndrome    PAD (peripheral artery disease) ([place])    Sepsis ([place]) 06/2021    Medications:  No chronic anticoagulation PTA  Assessment: Pharmacy has been consulted to initiate Heparin in 35yo patient admitted with stroke-like symptoms. Her symptoms began to resolve, but neurologist noted concern for lower extremity ischemia, likely embolic.   Goal of Therapy:  Heparin level 0.3-0.5 units/ml(Per Stroke  Protocol) Monitor platelets by anticoagulation protocol: Yes  2/10 2222 HL < 0.10, subtherapeutic 2/11 0906 HL 0.12, subtherapeutic 2/11 2048 HL 0.22, subtherapeutic  2/12 0602 HL 0.44, therapeutic x 1   Plan:  Continue heparin infusion at 2100 units/hr Recheck confirmatory HL in 8 hours  Continue to monitor H&H and platelets  [name], PharmD Clinical Pharmacist 11/28/2021 7:38 AM

## 2021-11-28 NOTE — Progress Notes (Addendum)
PD is complete.

## 2021-11-29 ENCOUNTER — Inpatient Hospital Stay: Payer: Medicare Other

## 2021-11-29 DIAGNOSIS — I7025 Atherosclerosis of native arteries of other extremities with ulceration: Secondary | ICD-10-CM

## 2021-11-29 DIAGNOSIS — I639 Cerebral infarction, unspecified: Secondary | ICD-10-CM | POA: Diagnosis not present

## 2021-11-29 DIAGNOSIS — Z978 Presence of other specified devices: Secondary | ICD-10-CM

## 2021-11-29 DIAGNOSIS — I059 Rheumatic mitral valve disease, unspecified: Secondary | ICD-10-CM

## 2021-11-29 DIAGNOSIS — N185 Chronic kidney disease, stage 5: Secondary | ICD-10-CM | POA: Diagnosis not present

## 2021-11-29 DIAGNOSIS — R471 Dysarthria and anarthria: Secondary | ICD-10-CM | POA: Diagnosis not present

## 2021-11-29 DIAGNOSIS — I6340 Cerebral infarction due to embolism of unspecified cerebral artery: Secondary | ICD-10-CM | POA: Diagnosis not present

## 2021-11-29 DIAGNOSIS — I739 Peripheral vascular disease, unspecified: Secondary | ICD-10-CM | POA: Diagnosis not present

## 2021-11-29 LAB — BLOOD GAS, ARTERIAL
Acid-base deficit: 10 mmol/L — ABNORMAL HIGH (ref 0.0–2.0)
Bicarbonate: 16.8 mmol/L — ABNORMAL LOW (ref 20.0–28.0)
FIO2: 0.8
MECHVT: 400 mL
Mechanical Rate: 16
O2 Saturation: 99.7 %
PEEP: 10 cmH2O
Patient temperature: 37
RATE: 16 resp/min
pCO2 arterial: 40 mmHg (ref 32.0–48.0)
pH, Arterial: 7.23 — ABNORMAL LOW (ref 7.350–7.450)
pO2, Arterial: 221 mmHg — ABNORMAL HIGH (ref 83.0–108.0)

## 2021-11-29 LAB — COMPREHENSIVE METABOLIC PANEL
ALT: <5 U/L (ref 0–44)
AST: 12 U/L — ABNORMAL LOW (ref 15–41)
Albumin: 2.7 g/dL — ABNORMAL LOW (ref 3.5–5.0)
Alkaline Phosphatase: 132 U/L — ABNORMAL HIGH (ref 38–126)
Anion gap: 26 — ABNORMAL HIGH (ref 5–15)
BUN: 64 mg/dL — ABNORMAL HIGH (ref 6–20)
CO2: 15 mmol/L — ABNORMAL LOW (ref 22–32)
Calcium: 7.8 mg/dL — ABNORMAL LOW (ref 8.9–10.3)
Chloride: 93 mmol/L — ABNORMAL LOW (ref 98–111)
Creatinine, Ser: 11.44 mg/dL — ABNORMAL HIGH (ref 0.44–1.00)
GFR, Estimated: 4 mL/min — ABNORMAL LOW (ref >60)
Glucose, Bld: 152 mg/dL — ABNORMAL HIGH (ref 70–99)
Potassium: 3.5 mmol/L (ref 3.5–5.1)
Sodium: 134 mmol/L — ABNORMAL LOW (ref 135–145)
Total Bilirubin: 1 mg/dL (ref 0.3–1.2)
Total Protein: 6.6 g/dL (ref 6.5–8.1)

## 2021-11-29 LAB — HOMOCYSTEINE: Homocysteine: 27.6 umol/L — ABNORMAL HIGH (ref 0.0–14.5)

## 2021-11-29 LAB — CBC
HCT: 27.5 % — ABNORMAL LOW (ref 36.0–46.0)
Hemoglobin: 8.5 g/dL — ABNORMAL LOW (ref 12.0–15.0)
MCH: 30.9 pg (ref 26.0–34.0)
MCHC: 30.9 g/dL (ref 30.0–36.0)
MCV: 100 fL (ref 80.0–100.0)
Platelets: 274 10*3/uL (ref 150–400)
RBC: 2.75 MIL/uL — ABNORMAL LOW (ref 3.87–5.11)
RDW: 18 % — ABNORMAL HIGH (ref 11.5–15.5)
WBC: 23.3 10*3/uL — ABNORMAL HIGH (ref 4.0–10.5)
nRBC: 0.7 % — ABNORMAL HIGH (ref 0.0–0.2)

## 2021-11-29 LAB — HEPATITIS PANEL, ACUTE
HCV Ab: NONREACTIVE
Hep A IgM: NONREACTIVE
Hep B C IgM: NONREACTIVE
Hepatitis B Surface Ag: NONREACTIVE

## 2021-11-29 LAB — GLUCOSE, CAPILLARY
Glucose-Capillary: 111 mg/dL — ABNORMAL HIGH (ref 70–99)
Glucose-Capillary: 78 mg/dL (ref 70–99)

## 2021-11-29 LAB — WET PREP, GENITAL
Sperm: NONE SEEN
Trich, Wet Prep: NONE SEEN
WBC, Wet Prep HPF POC: >=10 — AB (ref <10)
Yeast Wet Prep HPF POC: NONE SEEN

## 2021-11-29 LAB — MAGNESIUM: Magnesium: 1.6 mg/dL — ABNORMAL LOW (ref 1.7–2.4)

## 2021-11-29 LAB — PHOSPHORUS: Phosphorus: 9.3 mg/dL — ABNORMAL HIGH (ref 2.5–4.6)

## 2021-11-29 LAB — C3 COMPLEMENT: C3 Complement: 107 mg/dL (ref 82–167)

## 2021-11-29 LAB — C4 COMPLEMENT: Complement C4, Body Fluid: 17 mg/dL (ref 12–38)

## 2021-11-29 LAB — ANTI-DNA ANTIBODY, DOUBLE-STRANDED: ds DNA Ab: <1 IU/mL (ref 0–9)

## 2021-11-29 LAB — HEPARIN LEVEL (UNFRACTIONATED)
Heparin Unfractionated: 0.36 IU/mL (ref 0.30–0.70)
Heparin Unfractionated: 0.4 IU/mL (ref 0.30–0.70)

## 2021-11-29 IMAGING — DX DG CHEST 1V PORT
1 series · 2 of 2 positions shown · non-contrast
Comparison: [DATE]

CLINICAL DATA: Intubated

EXAM:
PORTABLE CHEST 1 VIEW

[Series 1: chest ap · 0.14mm/px · 2 of 2 slices shown]
[im 1/2]
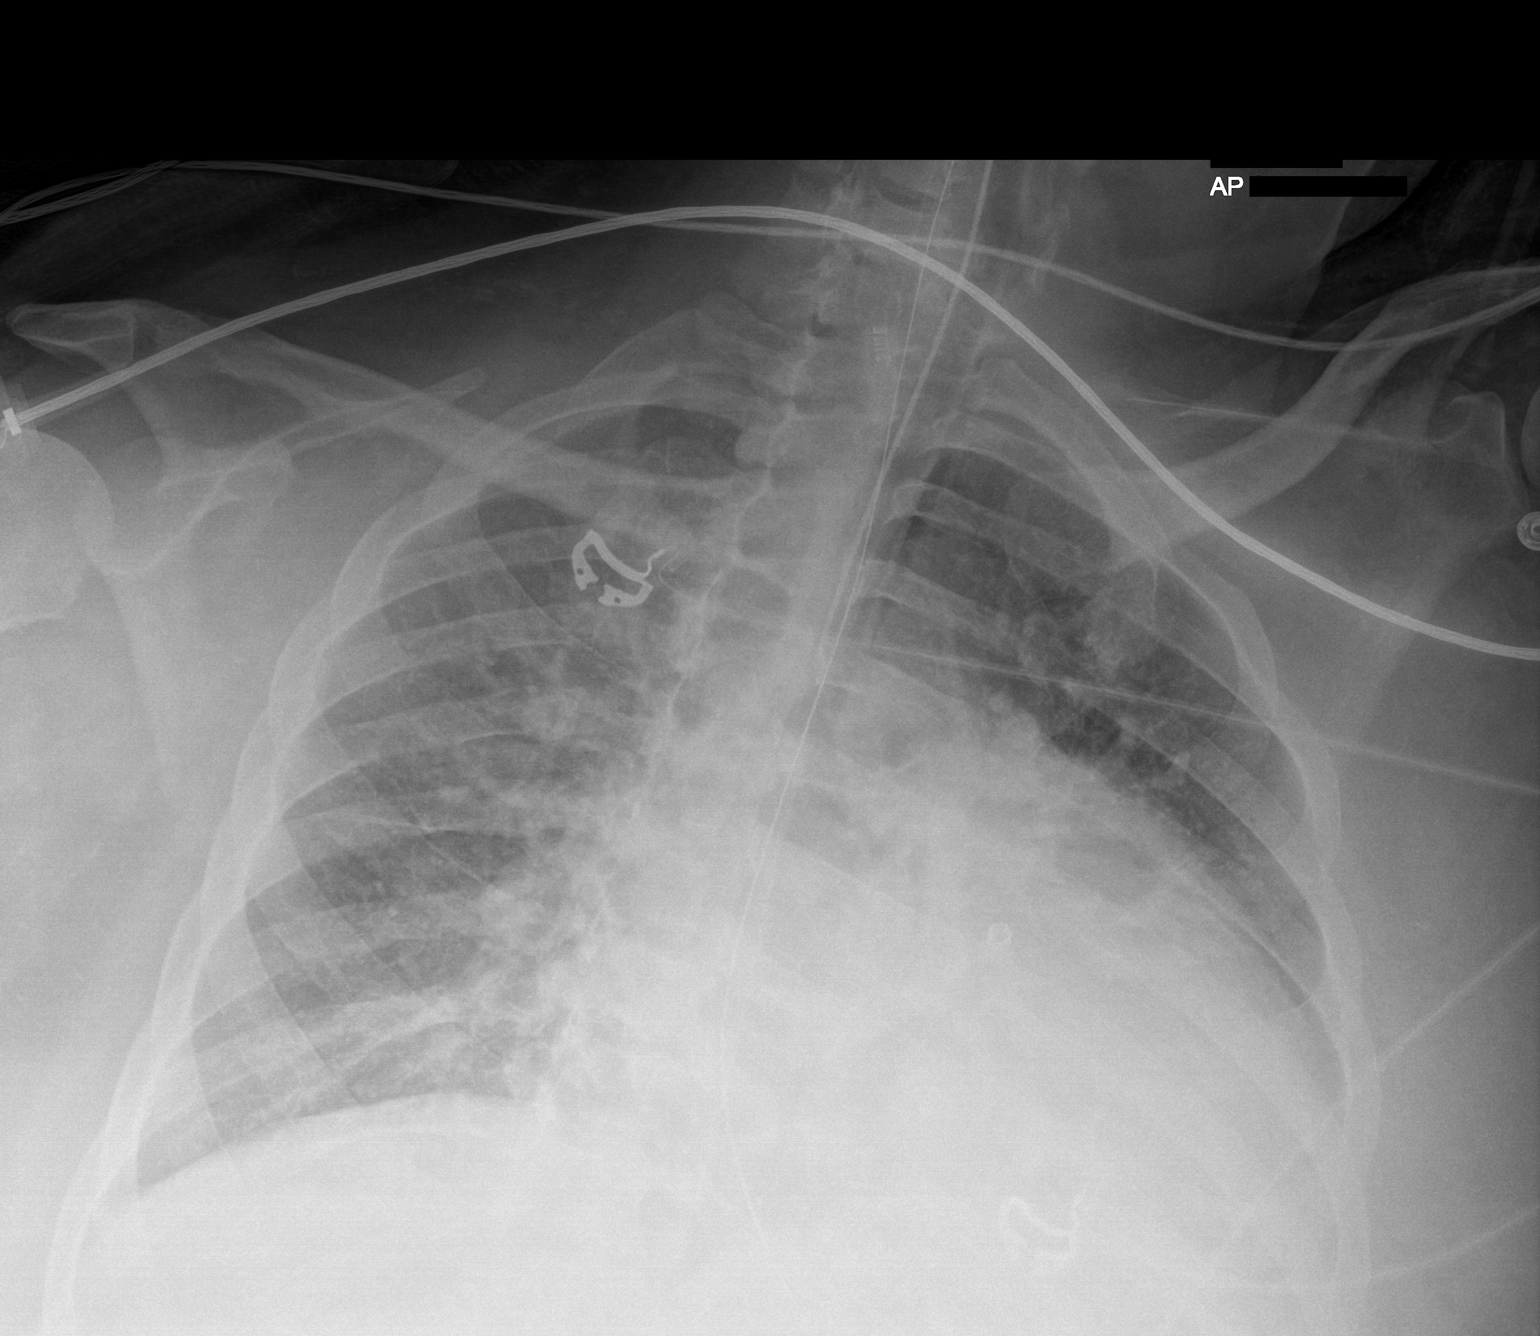
[im 2/2]
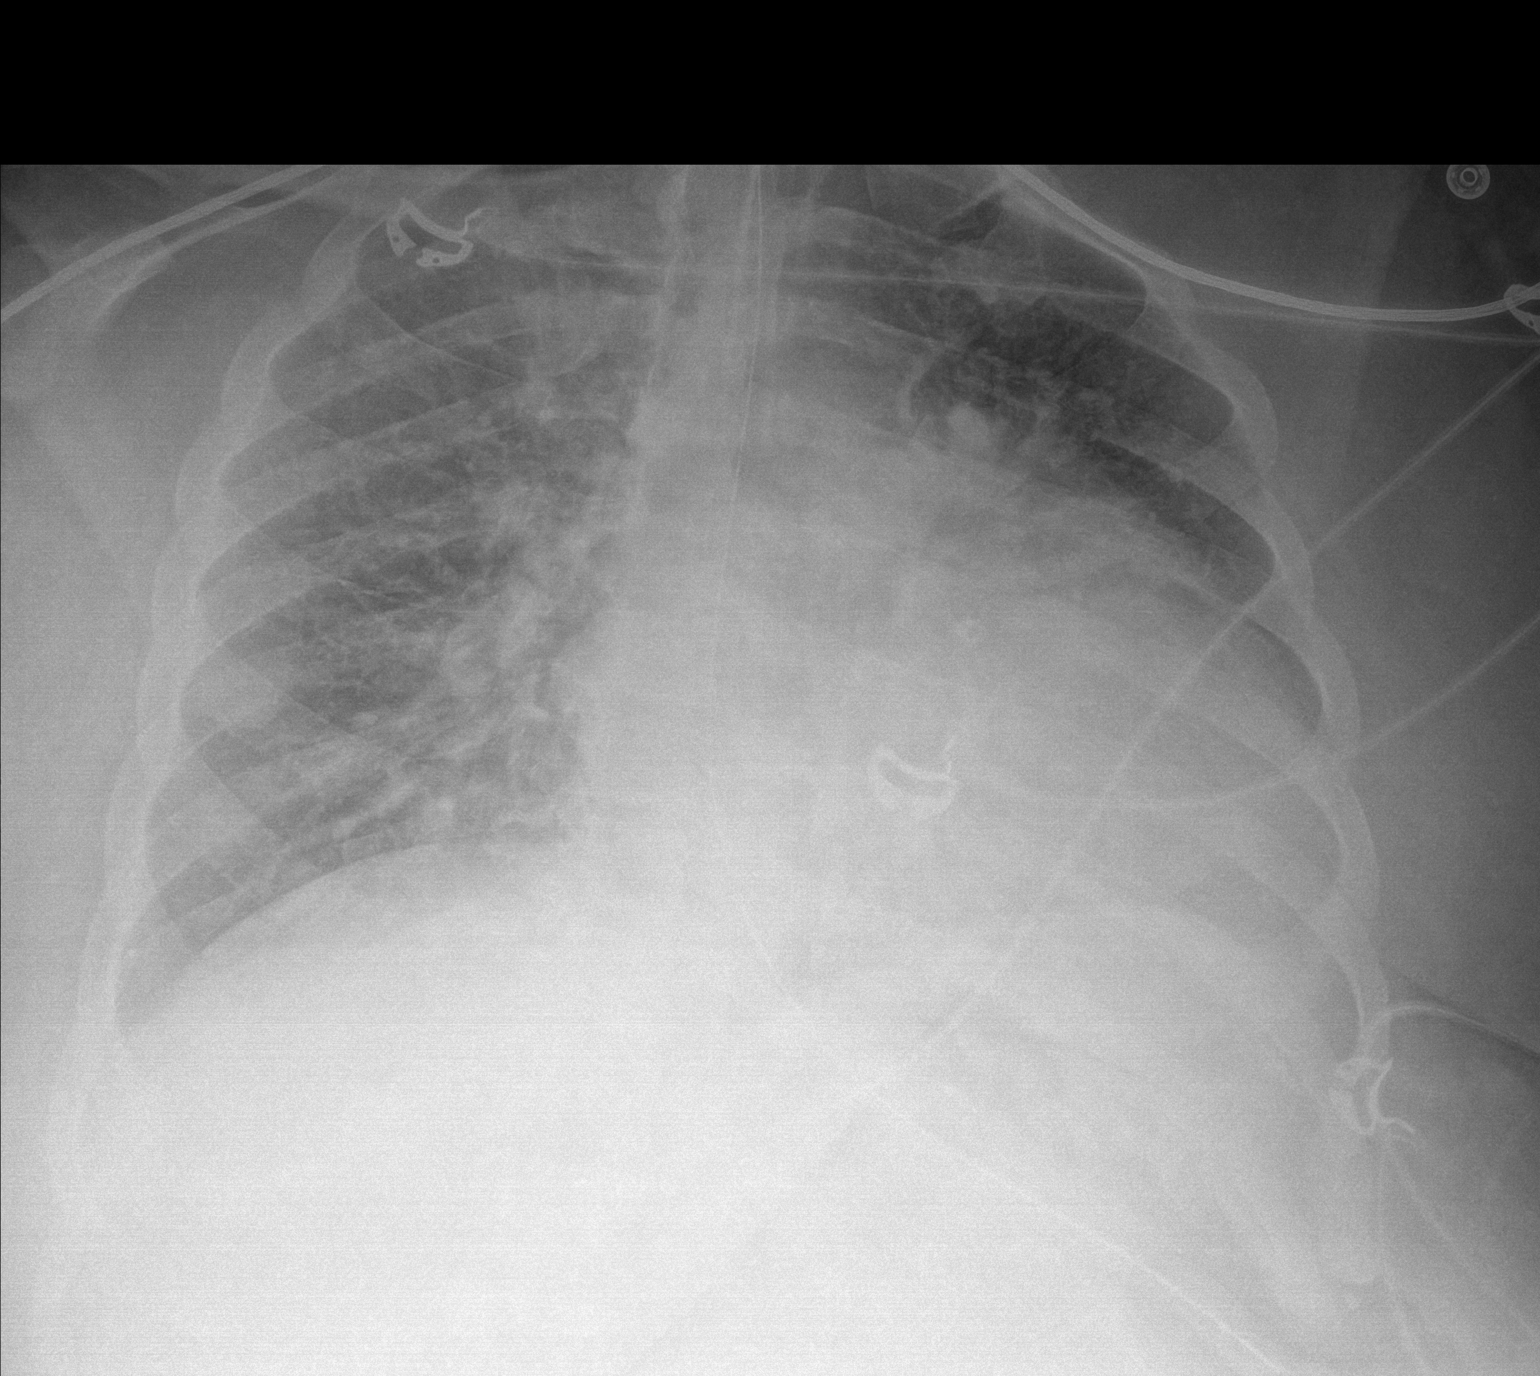

[2 of 2 positions shown; findings below may reference images not displayed]

FINDINGS: Initial film is rotated to the left which was repeated. Endotracheal
tube approximately 2.5 cm above the carina. NG tube within the
stomach.

Heart is enlarged with vascular congestion and diffuse interstitial
prominence suggesting component of mild interstitial edema.
Difficult to exclude small posterior layering effusions. Associated
bibasilar atelectasis. No large pneumothorax.
IMPRESSION: Limited exam but endotracheal tube approximately 2.5 cm above the
carina.

Cardiomegaly with mild interstitial edema pattern and basilar
atelectasis.

## 2021-11-29 MED ORDER — MIDAZOLAM-SODIUM CHLORIDE 100-0.9 MG/100ML-% IV SOLN
0.5000 mg/h | INTRAVENOUS | Status: DC
  Administered 2021-11-29: 16:00:00 1 mg/h via INTRAVENOUS
  Administered 2021-12-01: 4 mg/h via INTRAVENOUS
  Filled 2021-11-29 (×2): qty 100

## 2021-11-29 MED ORDER — FENTANYL 2500MCG IN NS 250ML (10MCG/ML) PREMIX INFUSION
INTRAVENOUS | Status: AC
  Administered 2021-11-29: 50 ug/h via INTRAVENOUS
  Filled 2021-11-29: qty 250

## 2021-11-29 MED ORDER — FENTANYL 2500MCG IN NS 250ML (10MCG/ML) PREMIX INFUSION
50.0000 ug/h | INTRAVENOUS | Status: DC
  Administered 2021-11-30 (×2): 150 ug/h via INTRAVENOUS
  Administered 2021-12-01: 50 ug/h via INTRAVENOUS
  Administered 2021-12-01: 200 ug/h via INTRAVENOUS
  Administered 2021-12-03 – 2021-12-04 (×2): 50 ug/h via INTRAVENOUS
  Filled 2021-11-29 (×4): qty 250

## 2021-11-29 MED ORDER — ROCURONIUM BROMIDE 10 MG/ML (PF) SYRINGE
50.0000 mg | PREFILLED_SYRINGE | Freq: Once | INTRAVENOUS | Status: AC
  Administered 2021-11-29: 50 mg via INTRAVENOUS
  Filled 2021-11-29: qty 10

## 2021-11-29 MED ORDER — MAGNESIUM SULFATE 2 GM/50ML IV SOLN
2.0000 g | Freq: Once | INTRAVENOUS | Status: AC
  Administered 2021-11-29: 2 g via INTRAVENOUS
  Filled 2021-11-29: qty 50

## 2021-11-29 MED ORDER — ORAL CARE MOUTH RINSE
15.0000 mL | OROMUCOSAL | Status: DC
  Administered 2021-11-29 – 2021-12-04 (×49): 15 mL via OROMUCOSAL

## 2021-11-29 MED ORDER — HEPARIN SODIUM (PORCINE) 1000 UNIT/ML IJ SOLN
INTRAMUSCULAR | Status: AC
  Filled 2021-11-29: qty 10

## 2021-11-29 MED ORDER — FENTANYL BOLUS VIA INFUSION
50.0000 ug | INTRAVENOUS | Status: DC | PRN
  Administered 2021-11-30 – 2021-12-04 (×2): 50 ug via INTRAVENOUS
  Filled 2021-11-29: qty 100

## 2021-11-29 MED ORDER — FAMOTIDINE IN NACL 20-0.9 MG/50ML-% IV SOLN
20.0000 mg | Freq: Two times a day (BID) | INTRAVENOUS | Status: DC
  Administered 2021-11-29 – 2021-11-30 (×3): 20 mg via INTRAVENOUS
  Filled 2021-11-29 (×3): qty 50

## 2021-11-29 MED ORDER — FENTANYL CITRATE (PF) 100 MCG/2ML IJ SOLN
100.0000 ug | Freq: Once | INTRAMUSCULAR | Status: AC
  Administered 2021-11-29: 100 ug via INTRAVENOUS
  Filled 2021-11-29: qty 2

## 2021-11-29 MED ORDER — PROPOFOL 1000 MG/100ML IV EMUL: 0.0000 ug/kg/min | INTRAVENOUS | Status: DC

## 2021-11-29 MED ORDER — DOCUSATE SODIUM 50 MG/5ML PO LIQD
100.0000 mg | Freq: Two times a day (BID) | ORAL | Status: DC
  Administered 2021-11-29 – 2021-12-03 (×8): 100 mg
  Filled 2021-11-29 (×8): qty 10

## 2021-11-29 MED ORDER — POLYETHYLENE GLYCOL 3350 17 G PO PACK
17.0000 g | PACK | Freq: Every day | ORAL | Status: DC
  Administered 2021-11-29 – 2021-12-03 (×4): 17 g
  Filled 2021-11-29 (×4): qty 1

## 2021-11-29 MED ORDER — SODIUM CHLORIDE 0.9 % IV SOLN: INTRAVENOUS | Status: DC | PRN

## 2021-11-29 MED ORDER — PROPOFOL 1000 MG/100ML IV EMUL
INTRAVENOUS | Status: AC
  Administered 2021-11-29: 10 ug/kg/min via INTRAVENOUS
  Filled 2021-11-29: qty 100

## 2021-11-29 MED ORDER — CHLORHEXIDINE GLUCONATE 0.12% ORAL RINSE (MEDLINE KIT)
15.0000 mL | Freq: Two times a day (BID) | OROMUCOSAL | Status: DC
  Administered 2021-11-29 – 2021-12-04 (×10): 15 mL via OROMUCOSAL

## 2021-11-29 MED ORDER — ETOMIDATE 2 MG/ML IV SOLN
20.0000 mg | Freq: Once | INTRAVENOUS | Status: AC
Start: 2021-11-29 — End: 2021-11-29
  Administered 2021-11-29: 20 mg via INTRAVENOUS
  Filled 2021-11-29: qty 10

## 2021-11-29 NOTE — Plan of Care (Signed)
S/P intubation today. Placed on PD by dialysis nurse. Remains on fentanyl, versed, heparin, and dextrose infusions.   Problem: Education: Goal: Knowledge of disease or condition will improve 11/29/2021 2103 by Jesse Sans, RN Outcome: Not Progressing  Goal: Knowledge of secondary prevention will improve (SELECT ALL) 11/29/2021 2103 by Jesse Sans, RN Outcome: Not Progressing  Goal: Knowledge of patient specific risk factors will improve (INDIVIDUALIZE FOR PATIENT) 11/29/2021 2103 by Jesse Sans, RN Outcome: Not Progressing Goal: Individualized Educational Video(s) 11/29/2021 2103 by Jesse Sans, RN Outcome: Not Progressing  Problem: Education: Goal: Knowledge of patient specific risk factors will improve (INDIVIDUALIZE FOR PATIENT) 11/29/2021 2103 by Jesse Sans, RN Outcome: Not Progressing   Problem: Education: Goal: Individualized Educational Video(s) 11/29/2021 2103 by Jesse Sans, RN Outcome: Not Progressing   Problem: Coping: Goal: Will verbalize positive feelings about self 11/29/2021 2103 by Jesse Sans, RN Outcome: Not Progressing   Problem: Coping: Goal: Will identify appropriate support needs 11/29/2021 2103 by Jesse Sans, RN Outcome: Not Progressing   Problem: Health Behavior/Discharge Planning: Goal: Ability to manage health-related needs will improve 11/29/2021 2103 by Jesse Sans, RN Outcome: Not Progressing   Problem: Self-Care: Goal: Ability to participate in self-care as condition permits will improve 11/29/2021 2103 by Jesse Sans, RN Outcome: Not Progressing   Problem: Self-Care: Goal: Verbalization of feelings and concerns over difficulty with self-care will improve 11/29/2021 2103 by Jesse Sans, RN Outcome: Not Progressing   Problem: Self-Care: Goal: Ability to communicate needs accurately will improve 11/29/2021 2103 by Jesse Sans, RN Outcome: Not Progressing   Problem: Nutrition: Goal: Risk of  aspiration will decrease 11/29/2021 2103 by Jesse Sans, RN Outcome: Not Progressing   Problem: Nutrition: Goal: Dietary intake will improve 11/29/2021 2103 by Jesse Sans, RN Outcome: Not Progressing   Problem: Ischemic Stroke/TIA Tissue Perfusion: Goal: Complications of ischemic stroke/TIA will be minimized 11/29/2021 2103 by Jesse Sans, RN Outcome: Not Progressing   Problem: Spontaneous Subarachnoid Hemorrhage Tissue Perfusion: Goal: Complications of Spontaneous Subarachnoid Hemorrhage will be minimized 11/29/2021 2103 by Jesse Sans, RN Outcome: Not Progressing   Problem: Education: Goal: Knowledge of General Education information will improve Description: Including pain rating scale, medication(s)/side effects and non-pharmacologic comfort measures 11/29/2021 2103 by Jesse Sans, RN Outcome: Not Progressing   Problem: Health Behavior/Discharge Planning: Goal: Ability to manage health-related needs will improve 11/29/2021 2103 by Jesse Sans, RN Outcome: Not Progressing   Problem: Clinical Measurements: Goal: Ability to maintain clinical measurements within normal limits will improve 11/29/2021 2103 by Jesse Sans, RN Outcome: Not Progressing   Problem: Clinical Measurements: Goal: Will remain free from infection 11/29/2021 2103 by Jesse Sans, RN Outcome: Not Progressing   Problem: Clinical Measurements: Goal: Diagnostic test results will improve 11/29/2021 2103 by Jesse Sans, RN Outcome: Not Progressing   Problem: Clinical Measurements: Goal: Respiratory complications will improve 11/29/2021 2103 by Jesse Sans, RN Outcome: Not Progressing   Problem: Clinical Measurements: Goal: Cardiovascular complication will be avoided 11/29/2021 2103 by Jesse Sans, RN Outcome: Not Progressing   Problem: Activity: Goal: Risk for activity intolerance will decrease 11/29/2021 2103 by Jesse Sans, RN Outcome: Not Progressing    Problem: Nutrition: Goal: Adequate nutrition will be maintained 11/29/2021 2103 by Jesse Sans, RN Outcome: Not Progressing   Problem: Coping: Goal: Level of anxiety will decrease 11/29/2021 2103 by Jesse Sans, RN Outcome: Not Progressing   Problem:  Elimination: Goal: Will not experience complications related to bowel motility 11/29/2021 2103 by Jesse Sans, RN Outcome: Not Progressing   Problem: Elimination: Goal: Will not experience complications related to urinary retention 11/29/2021 2103 by Jesse Sans, RN Outcome: Not Progressing   Problem: Pain Managment: Goal: General experience of comfort will improve 11/29/2021 2103 by Jesse Sans, RN Outcome: Not Progressing   Problem: Safety: Goal: Ability to remain free from injury will improve 11/29/2021 2103 by Jesse Sans, RN Outcome: Not Progressing   Problem: Skin Integrity: Goal: Risk for impaired skin integrity will decrease 11/29/2021 2103 by Jesse Sans, RN Outcome: Not Progressing

## 2021-11-29 NOTE — Progress Notes (Signed)
Neurology Progress Note   S:// Patient transferred to the ICU after having acute respiratory failure with hypoxia while in CT scanner then respiratory status deteriorated this morning and required intubation.  O:// Current vital signs: BP (!) 127/103    Pulse 95    Temp (!) 97.3 F (36.3 C) (Oral)    Resp (!) 29    Ht 5' 6" (1.676 m)    Wt 131.5 kg    SpO2 98%    BMI 46.79 kg/m  Vital signs in last 24 hours: Temp:  [96.5 F (35.8 C)-98.7 F (37.1 C)] 97.3 F (36.3 C) (02/13 0130) Pulse Rate:  [27-103] 95 (02/13 0315) Resp:  [14-32] 29 (02/13 0828) SpO2:  [81 %-100 %] 98 % (02/13 0828) Arterial Line BP: (86-144)/(50-84) 138/84 (02/13 0630)  Exam today without significant changes as compared to yesterday. General: Sick appearing obese woman, appears older than stated age, laying in bed, intubated. HEENT: Normocephalic, atraumatic CVS: Regular rhythm Abdomen: Obese without tenderness Extremities: Edematous, right calf ulcer looks healed, both feet bandaged. Respiratory: bilaterally equal excursions, no audible wheeze/cuff leak. Obtunded on sedation, non verbal. Mimics and follow some commands but remains nonverbal. Cranial nerves: Pupils equal round sluggishly reactive to light, visual fields appear full to confrontation, face grossly symmetric (ET tube in place). Motor withdraws to noxious pain in left upper/lower extremities and  right lower extremity. No withdrawal in right upper. DTRs mute Toes mute.  Medications  Current Facility-Administered Medications:    acetaminophen (TYLENOL) tablet 650 mg, 650 mg, Oral, Q4H PRN **OR** acetaminophen (TYLENOL) 160 MG/5ML solution 650 mg, 650 mg, Per Tube, Q4H PRN, 650 mg at 11/27/21 0123 **OR** acetaminophen (TYLENOL) suppository 650 mg, 650 mg, Rectal, Q4H PRN, Rise Patience, MD, 650 mg at 11/27/21 0827   aspirin suppository 300 mg, 300 mg, Rectal, Daily, 300 mg at 11/27/21 0826 **OR** aspirin tablet 325 mg, 325 mg, Oral, Daily,  Rise Patience, MD, 325 mg at 11/19/2021 1804   ceFEPIme (MAXIPIME) 1 g in sodium chloride 0.9 % 100 mL IVPB, 1 g, Intravenous, Daily, Darnelle Bos, RPH, Last Rate: 200 mL/hr at 11/29/21 0100, Infusion Verify at 11/29/21 0100   chlorhexidine (PERIDEX) 0.12 % solution 15 mL, 15 mL, Mouth Rinse, BID, Rust-Chester, Britton L, NP, 15 mL at 11/28/21 2200   Chlorhexidine Gluconate Cloth 2 % PADS 6 each, 6 each, Topical, Daily, Rise Patience, MD, 6 each at 11/29/21 0215   collagenase (SANTYL) ointment, , Topical, Daily, Rise Patience, MD, Given at 11/29/21 0215   dextrose 10 % infusion, , Intravenous, Continuous, Sharion Settler, NP, Last Rate: 25 mL/hr at 11/29/21 0100, Infusion Verify at 11/29/21 0100   dialysis solution 1.5% low-MG/low-CA dianeal solution, , Intraperitoneal, Q24H, Kolluru, Sarath, MD, New Bag at 11/28/21 1912   fentaNYL (SUBLIMAZE) injection 12.5 mcg, 12.5 mcg, Intravenous, Q2H PRN, Edwin Dada, MD, 12.5 mcg at 11/29/21 0905   gentamicin cream (GARAMYCIN) 0.1 % 1 application, 1 application, Topical, Daily, Kolluru, Sarath, MD, 1 application at 32/12/24 1700   heparin ADULT infusion 100 units/mL (25000 units/2105m), 2,200 Units/hr, Intravenous, Continuous, Rauer, SForde Dandy RPH, Last Rate: 22 mL/hr at 11/29/21 0338, 2,200 Units/hr at 11/29/21 0338   hydrALAZINE (APRESOLINE) injection 5 mg, 5 mg, Intravenous, Q4H PRN, KRise Patience MD   magnesium sulfate IVPB 2 g 50 mL, 2 g, Intravenous, Once, Danford, CSuann Larry MD   MEDLINE mouth rinse, 15 mL, Mouth Rinse, q12n4p, Rust-Chester, Britton L, NP, 15 mL at 11/28/21 1600  pantoprazole (PROTONIX) injection 40 mg, 40 mg, Intravenous, Q24H, Rust-Chester, Britton L, NP, 40 mg at 11/29/21 0215   [COMPLETED] remdesivir 200 mg in sodium chloride 0.9% 250 mL IVPB, 200 mg, Intravenous, Once, Last Rate: 580 mL/hr at 11/27/2021 1846, 200 mg at 12/11/2021 1846 **FOLLOWED BY** remdesivir 100 mg in sodium chloride  0.9 % 100 mL IVPB, 100 mg, Intravenous, Daily, Rise Patience, MD, Stopped at 11/28/21 1158  Labs ABG pH 7.23 A1c 5.1 LDL 76   2D echo IMPRESSION   1. Left ventricular ejection fraction, by estimation, is 55 to 60%. The  left ventricle has normal function. The left ventricle has no regional  wall motion abnormalities. There is mild concentric left ventricular  hypertrophy. Left ventricular diastolic parameters are indeterminate. Elevated left atrial pressure.   2. Right ventricular systolic function is normal. The right ventricular  size is normal.   3. Left atrial size was severely dilated.   4. Restricted anterior and posterior mitral valve leaflet motion.  Compared with the echo in 2017, the mitral valve is now heavily thickened and clacified. These findings are all new. Could represent endocarditis.  Recommend TEE to better evaluate. The mitral valve is degenerative. Moderate mitral valve regurgitation. Severe mitral stenosis. Severe mitral annular calcification.   5. Tricuspid valve regurgitation is moderate to severe.   6. The aortic valve is tricuspid. Aortic valve regurgitation is not  visualized. No aortic stenosis is present.   7. The inferior vena cava is normal in size with greater than 50%  respiratory variability, suggesting right atrial pressure of 3 mmHg.   Imaging I have reviewed images in epic and the results pertinent to this consultation are: CT-head-no acute changes.  No bleed.   MRI examination of the brain-scattered infarcts in the lateral right frontal lobe, posterior left frontal lobe with patchy acute small infarcts in the callosal splenium on the right with additional subacute infarcts question within the left frontoparietal white matter.  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  Background chronic small vessel ischemic changes within the cerebral white matter.  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  Small  chronic cortical subcortical infarcts within the right parietal occipital lobes.  Foot x-ray concerning for osteomyelitis. Repeat CT head yesterday-negative for acute process US Venous Img Lower Bilateral  11/29/2021 - negative for evidence of deep venous thrombosis in either lower extremity to the level of the knees. Calf veins are not well seen due to patient body habitus and subcutaneous edema.   MRA brain without contrast - Pending Carotid Dopplers - Pending   Assessment: 36 year old woman with extensive vascular risk factor history presenting with strokelike symptoms and altered mental status. EMS was called noted left-sided arm drift and speech difficulty. Exam initially encephalopathic with some left leg weakness and mild left facial droop with dysarthria. Stat MRI showed infarcts of different ages precluding IV thrombolysis. Her poor modified Rankin score precludes endovascular thrombectomy. Admitted for stroke work-up and subsequently developed expressive aphasia intact comprehension. Slight improvement of left weakness with some right arm drift, both legs are antigravity but drift down to the bed.  COVID-19 (+) infection which is likely incidental as she was asymptomatic and started on remdesivir.  Started on heparin on the recommendations of vascular surgery due to concern for right lower extremity thrombus.  Given the myriad of medical findings, including renal failure, vascular disease and now strokes, hypercoagulable work-up and a rheumatological work-up will also be ordered.  Impression -Acute  multifocal embolic strokes - Likely central source (Endocarditis versus atrial thrombus) -Peripheral vascular disease -ESRD on dialysis -Mitral valve abnormality on TTE-concern for infective endocarditis -Severe left atrial dilatation - Question underlying arrhythmia/PAF  Recommendations: -MRA head - pending -Carotid Dopplers - pending -Cardiology will consider TEE once medically  stable.  -Antibiotics per primary team for endocarditis and osteomyelitis -Continue telemetry -Continue heparin stroke protocol for possible lower extremity arterial thrombus.   -Given concern for endocarditis-I would stop the  Plavix stopped given concern for endocarditis, already on heparin (Plavix can be resumed once head imaging rules out mycotic aneurysms). -Blood pressure goal systolic [phone] from a neurological standpoint. -Hypercoagulable panel and vasculitis panel ordered  Pending transfer to [facility] for ongoing cardiac/cardiothoracic evaluation: Evaluation for the mitral valve. [facility] and stroke team are aware.  Electronically signed by:  Lynnae Sandhoff, MD Page: [id number] 11/29/2021, 9:29 AM

## 2021-11-29 NOTE — Progress Notes (Signed)
[facility]  ROUNDING NOTE   Subjective:   [name] was admitted to [facility] on 11/30/2021 for PVD (peripheral vascular disease) ([place]) [I73.9] Pain [R52] Dysarthria [R47.1] Acute CVA (cerebrovascular accident) [facility]) [I63.9] Cerebrovascular 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 ([place]) [I63.40] COVID-19 [U07.1]  Peritoneal dialysis last night. Tolerated treatment well.   Patient is agitated, tachpneic Not following commands   Objective:  Vital signs in last 24 hours:  Temp:  [96.5 F (35.8 C)-98.7 F (37.1 C)] 97.6 F (36.4 C) (02/13 0900) Pulse Rate:  [27-109] 109 (02/13 0900) Resp:  [14-32] 28 (02/13 0900) SpO2:  [81 %-100 %] 99 % (02/13 0900) Arterial Line BP: (86-144)/(50-85) 128/74 (02/13 0905)  Weight change:  Filed Weights   11/27/21 0100 11/27/21 1628 11/28/21 0500  Weight: 131.1 kg 133.4 kg 131.5 kg    Intake/Output: I/O last 3 completed shifts: In: 16623.8 [I.V.:1080; Other:15000; IV Piggyback:543.8] Out: 69485 [Other:14545]   Intake/Output this shift:  Total I/O In: 1500 [Other:1500] Out: 14730 [Other:14730]  Physical Exam: General: Laying in bed  Head: Normocephalic, atraumatic. Moist oral mucosal membranes  Lungs:  Coarse breath souinds, normal effort, 10 L [place], tachypneic  Heart: tachycardic  Abdomen:  Soft, nontender, PD catheter, obese  Extremities:  no peripheral edema. Left foot in dressings  Neurologic: Not following commands today. Eyes open. agitated  Skin: No lesions  Access: PD catheter    Basic Metabolic Panel: Recent Labs  Lab 11/24/21 1005 11/23/2021 1412 11/27/21 0528 11/28/21 0602 11/29/21 0342  NA 136 140 138 136 134*  K 3.1* 3.8 4.1 4.0 3.5  CL 94* 94* 94* 93* 93*  CO2 19* 20* 17* 18* 15*  GLUCOSE 110* 122* 98 108* 152*  BUN 69* 65* 67* 67* 64*  CREATININE 12.79* 11.34* 12.01* 11.50* 11.44*  CALCIUM 7.8* 8.3* 8.2* 7.9* 7.8*  MG  --   --   --  1.5* 1.6*  PHOS  --   --   --  10.0* 9.3*      Liver Function Tests: Recent Labs  Lab 11/25/2021 1412 11/27/21 0528 11/28/21 0602 11/29/21 0342  AST 11* 11* 11* 12*  ALT 7 6 6  <5  ALKPHOS 131* 124 120 132*  BILITOT 1.0 0.7 1.0 1.0  PROT 7.1 6.6 6.5 6.6  ALBUMIN 2.8* 2.7* 2.8* 2.7*    No results for input(s): LIPASE, AMYLASE in the last 168 hours. No results for input(s): AMMONIA in the last 168 hours.  CBC: Recent Labs  Lab 11/22/2021 1320 11/30/2021 1412 11/27/21 0528 11/28/21 0602 11/29/21 0342  WBC 17.2* 16.6* 14.4* 17.4* 23.3*  NEUTROABS 14.0* 14.0* 11.8*  --   --   HGB 9.2* 9.4* 8.6* 8.6* 8.5*  HCT 28.4* 29.6* 27.4* 27.4* 27.5*  MCV 99.6 98.7 100.4* 100.4* 100.0  PLT 280 247 242 253 274     Cardiac Enzymes: No results for input(s): CKTOTAL, CKMB, CKMBINDEX, TROPONINI in the last 168 hours.  BNP: Invalid input(s): POCBNP  CBG: Recent Labs  Lab 11/27/21 1629 11/27/21 1902 11/28/21 0034 11/28/21 0524 11/28/21 2338  GLUCAP 75 81 108* 88 122*     Microbiology: Results for orders placed or performed during the hospital encounter of 11/30/2021  Resp Panel by RT-PCR (Flu A&B, Covid) Nasopharyngeal Swab     Status: Abnormal   Collection Time: 12/14/2021  2:12 PM   Specimen: Nasopharyngeal Swab; Nasopharyngeal(NP) swabs in vial transport medium  Result Value Ref Range Status   SARS Coronavirus 2 by RT PCR POSITIVE (  A) NEGATIVE Final    Comment: (NOTE) SARS-CoV-2 target nucleic acids are DETECTED.  The SARS-CoV-2 RNA is generally detectable in upper respiratory specimens during the acute phase of infection. Positive results are indicative of the presence of the identified virus, but do not rule out bacterial infection or co-infection with other pathogens not detected by the test. Clinical correlation with patient history and other diagnostic information is necessary to determine patient infection status. The expected result is Negative.  Fact Sheet for  Patients: EntrepreneurPulse.com.au  Fact Sheet for Healthcare Providers: IncredibleEmployment.be  This test is not yet approved or cleared by the [place] FDA and  has been authorized for detection and/or diagnosis of SARS-CoV-2 by FDA under an Emergency Use Authorization (EUA).  This EUA will remain in effect (meaning this test can be used) for the duration of  the COVID-19 declaration under Section 564(b)(1) of the A ct, 21 U.S.C. section 360bbb-3(b)(1), unless the authorization is terminated or revoked sooner.     Influenza A by PCR NEGATIVE NEGATIVE Final   Influenza B by PCR NEGATIVE NEGATIVE Final    Comment: (NOTE) The Xpert Xpress SARS-CoV-2/FLU/RSV plus assay is intended as an aid in the diagnosis of influenza from Nasopharyngeal swab specimens and should not be used as a sole basis for treatment. Nasal washings and aspirates are unacceptable for Xpert Xpress SARS-CoV-2/FLU/RSV testing.  Fact Sheet for Patients: EntrepreneurPulse.com.au  Fact Sheet for Healthcare Providers: IncredibleEmployment.be  This test is not yet approved or cleared by the [place] FDA and has been authorized for detection and/or diagnosis of SARS-CoV-2 by FDA under an Emergency Use Authorization (EUA). This EUA will remain in effect (meaning this test can be used) for the duration of the COVID-19 declaration under Section 564(b)(1) of the Act, 21 U.S.C. section 360bbb-3(b)(1), unless the authorization is terminated or revoked.  Performed at [facility], [address] [place], [place] [address]   CULTURE, BLOOD (ROUTINE X 2) w Reflex to ID Panel     Status: None (Preliminary result)   Collection Time: 11/27/21 10:32 AM   Specimen: BLOOD  Result Value Ref Range Status   Specimen Description BLOOD BLOOD RIGHT HAND  Final   Special Requests   Final    BOTTLES DRAWN AEROBIC AND ANAEROBIC Blood Culture  adequate volume   Culture   Final    NO GROWTH 2 DAYS Performed at [facility], [address], [place], [place] [address]    Report Status PENDING  Incomplete  CULTURE, BLOOD (ROUTINE X 2) w Reflex to ID Panel     Status: None (Preliminary result)   Collection Time: 11/27/21 10:32 AM   Specimen: BLOOD  Result Value Ref Range Status   Specimen Description BLOOD BLOOD RIGHT ARM  Final   Special Requests   Final    BOTTLES DRAWN AEROBIC AND ANAEROBIC Blood Culture adequate volume   Culture   Final    NO GROWTH 2 DAYS Performed at [facility], [address], [place], [place] [address]    Report Status PENDING  Incomplete  MRSA Next Gen by PCR, Nasal     Status: None   Collection Time: 11/27/21  4:24 PM   Specimen: Nasal Mucosa; Nasal Swab  Result Value Ref Range Status   MRSA by PCR Next Gen NOT DETECTED NOT DETECTED Final    Comment: (NOTE) The GeneXpert MRSA Assay (FDA approved for NASAL specimens only), is one component of a comprehensive MRSA colonization surveillance program. It is not  intended to diagnose MRSA infection nor to guide or monitor treatment for MRSA infections. Test performance is not FDA approved in patients less than 42 years old. Performed at [facility], [address] [place], [place] [address]   CULTURE, BLOOD (ROUTINE X 2) w Reflex to ID Panel     Status: None (Preliminary result)   Collection Time: 11/28/21 11:42 AM   Specimen: BLOOD  Result Value Ref Range Status   Specimen Description BLOOD BLOOD RIGHT HAND  Final   Special Requests   Final    BOTTLES DRAWN AEROBIC AND ANAEROBIC Blood Culture adequate volume   Culture   Final    NO GROWTH < 24 HOURS Performed at [facility], [address], [place], [place] [address]    Report Status PENDING  Incomplete  CULTURE, BLOOD (ROUTINE X 2) w Reflex to ID Panel     Status: None (Preliminary result)   Collection Time: 11/28/21 11:42 AM   Specimen: BLOOD   Result Value Ref Range Status   Specimen Description BLOOD RIGHT ANTECUBITAL  Final   Special Requests   Final    AEROBIC BOTTLE ONLY Blood Culture results may not be optimal due to an inadequate volume of blood received in culture bottles   Culture   Final    NO GROWTH < 24 HOURS Performed at [facility], [address] [place], [place] [address]    Report Status PENDING  Incomplete  Wet prep, genital     Status: Abnormal   Collection Time: 11/29/21  9:16 AM   Specimen: Vaginal  Result Value Ref Range Status   Yeast Wet Prep HPF POC NONE SEEN NONE SEEN Final   Trich, Wet Prep NONE SEEN NONE SEEN Final   Clue Cells Wet Prep HPF POC PRESENT (A) NONE SEEN Final   WBC, Wet Prep HPF POC >=10 (A) <10 Final   Sperm NONE SEEN  Final    Comment: Performed at [facility], [address] [place],  [address]    Coagulation Studies: Recent Labs    11/17/2021 2222  LABPROT 16.3*  INR 1.3*     Urinalysis: No results for input(s): COLORURINE, LABSPEC, PHURINE, GLUCOSEU, HGBUR, BILIRUBINUR, KETONESUR, PROTEINUR, UROBILINOGEN, NITRITE, LEUKOCYTESUR in the last 72 hours.  Invalid input(s): APPERANCEUR    Imaging: CT HEAD WO CONTRAST (5MM)  Result Date: 11/27/2021 CLINICAL DATA:  Right-sided weakness.  Aphasia. EXAM: CT HEAD WITHOUT CONTRAST TECHNIQUE: Contiguous axial images were obtained from the base of the skull through the vertex without intravenous contrast. RADIATION DOSE REDUCTION: This exam was performed according to the departmental dose-optimization program which includes automated exposure control, adjustment of the mA and/or kV according to patient size and/or use of iterative reconstruction technique. COMPARISON:  11/28/2021. FINDINGS: Brain: No evidence of acute infarction, hemorrhage, hydrocephalus, extra-axial collection or mass lesion/mass effect. Small infarcts described on the previous day's exam less well-defined due to patient motion on the  current study. Vascular: No hyperdense vessel or unexpected calcification. Skull: No fracture. No discrete bone lesion. Large right frontal osteoma, stable. Sinuses/Orbits: Globes and orbits are unremarkable. Sinuses are clear. Other: None. IMPRESSION: 1. No acute intracranial abnormalities. No change from the previous day's study. Electronically Signed   By: Lajean Manes M.D.   On: 11/27/2021 15:15   US Venous Img Lower Bilateral (DVT)  Result Date: 11/29/2021 CLINICAL DATA:  Acute ischemic stroke, bilateral lower extremity swelling. Assess for DVT. EXAM: BILATERAL LOWER EXTREMITY VENOUS DOPPLER ULTRASOUND TECHNIQUE: Gray-scale sonography  with graded compression, as well as color Doppler and duplex ultrasound were performed to evaluate the lower extremity deep venous systems from the level of the common femoral vein and including the common femoral, femoral, profunda femoral, popliteal and calf veins including the posterior tibial, peroneal and gastrocnemius veins when visible. The superficial great saphenous vein was also interrogated. Spectral Doppler was utilized to evaluate flow at rest and with distal augmentation maneuvers in the common femoral, femoral and popliteal veins. COMPARISON:  None. FINDINGS: RIGHT LOWER EXTREMITY Common Femoral Vein: No evidence of thrombus. Normal compressibility, respiratory phasicity and response to augmentation. Saphenofemoral Junction: No evidence of thrombus. Normal compressibility and flow on color Doppler imaging. Profunda Femoral Vein: No evidence of thrombus. Normal compressibility and flow on color Doppler imaging. Femoral Vein: No evidence of thrombus. Normal compressibility, respiratory phasicity and response to augmentation. Popliteal Vein: No evidence of thrombus. Normal compressibility, respiratory phasicity and response to augmentation. Calf Veins: Limited visualization of the calf veins. No obvious thrombus. Superficial Great Saphenous Vein: No evidence of  thrombus. Normal compressibility. Venous Reflux:  None. Other Findings:  None. LEFT LOWER EXTREMITY Common Femoral Vein: No evidence of thrombus. Normal compressibility, respiratory phasicity and response to augmentation. Saphenofemoral Junction: No evidence of thrombus. Normal compressibility and flow on color Doppler imaging. Profunda Femoral Vein: No evidence of thrombus. Normal compressibility and flow on color Doppler imaging. Femoral Vein: No evidence of thrombus. Normal compressibility, respiratory phasicity and response to augmentation. Popliteal Vein: No evidence of thrombus. Normal compressibility, respiratory phasicity and response to augmentation. Calf Veins: Limited visualization of the calf veins. No obvious thrombus. Superficial Great Saphenous Vein: No evidence of thrombus. Normal compressibility. Venous Reflux:  None. Other Findings:  None. IMPRESSION: No evidence of deep venous thrombosis in either lower extremity to the level of the knees. Calf veins are not well seen due to patient body habitus and subcutaneous edema. Electronically Signed   By: Jacqulynn Cadet M.D.   On: 11/29/2021 07:06   DG Chest Port 1 View  Result Date: 11/27/2021 CLINICAL DATA:  Hypoxia. EXAM: PORTABLE CHEST 1 VIEW COMPARISON:  11/19/2021 FINDINGS: Cardiac silhouette mildly enlarged.  No mediastinal or hilar masses. Subtle hazy airspace opacity suggested in the left mid to upper lung. Remainder of the lungs is clear. No convincing pleural effusion or pneumothorax. Skeletal structures are grossly intact. IMPRESSION: 1. Subtle hazy airspace adjusted in the left mid to upper lung, which appears to be a change from the previous day's exam. This is consistent with pneumonia. Electronically Signed   By: Lajean Manes M.D.   On: 11/27/2021 15:17     Medications:    ceFEPime (MAXIPIME) IV 200 mL/hr at 11/29/21 0100   dextrose 25 mL/hr at 11/29/21 0100   dialysis solution 1.5% low-MG/low-CA     heparin 2,200 Units/hr  (11/29/21 0338)   magnesium sulfate bolus IVPB 2 g (11/29/21 0918)   remdesivir 100 mg in NS 100 mL Stopped (11/28/21 1158)    aspirin  300 mg Rectal Daily   Or   aspirin  325 mg Oral Daily   chlorhexidine  15 mL Mouth Rinse BID   Chlorhexidine Gluconate Cloth  6 each Topical Daily   collagenase   Topical Daily   gentamicin cream  1 application Topical Daily   mouth rinse  15 mL Mouth Rinse q12n4p   pantoprazole (PROTONIX) IV  40 mg Intravenous Q24H   acetaminophen **OR** acetaminophen (TYLENOL) oral liquid 160 mg/5 mL **OR** acetaminophen, fentaNYL (SUBLIMAZE) injection, hydrALAZINE  Assessment/ Plan:  [name] is a 36 y.o. 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 She was scheduled for debridement of her left foot wound later that day. Procedure was cancelled.   [facility] Peritoneal [facility] Shanon Payor 132kg CCPD 9 hours 5 exchanges 3060mL fills and last fill of 2084mL  End Stage Renal Disease: Peritoneal dialysis treatment last night. Tolerated treatment well. Will continue treatments PD tonight.   Anemia of kidney disease:   Lab Results  Component Value Date   HGB 8.5 (L) 11/29/2021   Hold off on EPO due to ischemic event.   Secondary Hyperparathyroidism with hyperphosphatemia and calciphylaxis: Labs from 2/7: PTH 1882, calcium 8 and phos 8.1. Getting sodium thiosulfate weekly.  - When patient is able to take PO, recommend restarting binders.  5. 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  Hypoglycemia on 2/10. Hemoglobin A1c of 5.1% on admission - monitor blood sugars  6. Altered mental status: with concerns for acute ischemic stroke.   MRI shows multiple chronic vessel small ischemic changes and lacunar infarcts concerning for embolic disease.   7. 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 Patient with high risk of requiring amputation.   8. COVID-19 infection: placed on isolation and remdesivir. Support care.      LOS: 3 Diantha Paxson 2/13/20239:44 AM

## 2021-11-29 NOTE — Progress Notes (Signed)
Lorriane Shire, dialysis RN, notified that PD is complete. Acknowledged.

## 2021-11-29 NOTE — Consult Note (Signed)
Graelyn, Bihl [id number] 04-18-1986 Flora Lipps, MD  Reason for Consult: Evaluate for tracheostomy  HPI: The patient is a 36 year old morbidly obese AAF patient admitted 3 days ago for multiple acute and subacute infarcts found on MRI with brain damage and toxic encephalopathy.  Patient has ESRD on PD, acute RLE ischemia and suspected osteomyelitis of the left foot, with possible endocarditis and progressive respiratory failure.  She required acute intubation to protect her airway and for lung management.  She had acute hypoxic and hypercapnic respiratory failure.  Consultation was placed for placing a trach tube for airway management long-term for rehabilitation.  Allergies:  Allergies  Allergen Reactions   Percocet [Oxycodone-Acetaminophen] Itching   Tomato Itching   Gabapentin Other (See Comments)    Extremity tremors    ROS: Review of systems normal other than 12 systems except per HPI.  PMH:  Past Medical History:  Diagnosis Date   Anemia of chronic disease    Asthma    well controlled-no inhaler   Calciphylaxis 2020   CHF (congestive heart failure) (HCC)    CKD (chronic kidney disease), stage III ([place])    Diabetes ([place])    type 2-diet controlled   Dyspnea    Hypertension    Morbid obesity ([place])    Nephrotic syndrome    PAD (peripheral artery disease) ([place])    Sepsis ([place]) 06/2021    FH:  Family History  Problem Relation Age of Onset   Hypertension Mother    Multiple sclerosis Mother    CAD Mother    Cancer Maternal Grandmother        Ovarian    Cancer Maternal Grandfather        Prostate    Kidney disease Father     SH:  Social History   Socioeconomic History   Marital status: Single    Spouse name: Not on file   Number of children: 0   Years of education: Not on file   Highest education level: Not on file  Occupational History   Occupation: works at [employer] 6   Tobacco Use   Smoking status: Former    Types: Cigars   Smokeless tobacco: Never   Scientific laboratory technician Use: Never used  Substance and Sexual Activity   Alcohol use: No    Alcohol/week: 0.0 standard drinks   Drug use: No   Sexual activity: Yes    Birth control/protection: None  Other Topics Concern   Not on file  Social History Narrative   Lives with mother    Social Determinants of Health   Financial Resource Strain: Not on file  Food Insecurity: Not on file  Transportation Needs: Not on file  Physical Activity: Not on file  Stress: Not on file  Social Connections: Not on file  Intimate Partner Violence: Not on file    PSH:  Past Surgical History:  Procedure Laterality Date   A/V FISTULAGRAM Left 10/29/2020   Procedure: A/V FISTULAGRAM;  Surgeon: Algernon Huxley, MD;  Location: [place] [facility];  Service: Cardiovascular;  Laterality: Left;   APPLICATION OF WOUND VAC Bilateral 09/15/2019   Procedure: APPLICATION OF WOUND VAC;  Surgeon: Benjamine Sprague, DO;  Location: [facility];  Service: General;  Laterality: Bilateral;   AV FISTULA PLACEMENT Left 07/09/2020   Procedure: ARTERIOVENOUS (AV) FISTULA CREATION ( BRACHIAL CEPHALIC);  Surgeon: Algernon Huxley, MD;  Location: [facility];  Service: Vascular;  Laterality: Left;   DIALYSIS/PERMA CATHETER INSERTION N/A 02/28/2019   Procedure: DIALYSIS/PERMA  CATHETER INSERTION;  Surgeon: Algernon Huxley, MD;  Location: [place] [facility];  Service: Cardiovascular;  Laterality: N/A;   DIALYSIS/PERMA CATHETER INSERTION N/A 09/17/2019   Procedure: DIALYSIS/PERMA CATHETER EXCHANGE;  Surgeon: Katha Cabal, MD;  Location: [place] [facility];  Service: Cardiovascular;  Laterality: N/A;   DIALYSIS/PERMA CATHETER REMOVAL N/A 03/18/2020   Procedure: DIALYSIS/PERMA CATHETER REMOVAL;  Surgeon: Algernon Huxley, MD;  Location: [place] [facility];  Service: Cardiovascular;  Laterality: N/A;   LIGATION OF ARTERIOVENOUS  FISTULA Left 06/03/2021   Procedure: LIGATION OF ARTERIOVENOUS  FISTULA;  Surgeon: Algernon Huxley, MD;  Location: [facility];   Service: Vascular;  Laterality: Left;   LOWER EXTREMITY ANGIOGRAPHY Left 12/02/2020   Procedure: LOWER EXTREMITY ANGIOGRAPHY;  Surgeon: Algernon Huxley, MD;  Location: [place] [facility];  Service: Cardiovascular;  Laterality: Left;   LOWER EXTREMITY ANGIOGRAPHY Right 01/25/2021   Procedure: LOWER EXTREMITY ANGIOGRAPHY;  Surgeon: Algernon Huxley, MD;  Location: [place] [facility];  Service: Cardiovascular;  Laterality: Right;   LOWER EXTREMITY ANGIOGRAPHY Left 04/15/2021   Procedure: LOWER EXTREMITY ANGIOGRAPHY;  Surgeon: Algernon Huxley, MD;  Location: [place] [facility];  Service: Cardiovascular;  Laterality: Left;   LOWER EXTREMITY ANGIOGRAPHY Right 05/20/2021   Procedure: LOWER EXTREMITY ANGIOGRAPHY;  Surgeon: Algernon Huxley, MD;  Location: [place] [facility];  Service: Cardiovascular;  Laterality: Right;   LOWER EXTREMITY ANGIOGRAPHY Left 05/27/2021   Procedure: LOWER EXTREMITY ANGIOGRAPHY;  Surgeon: Algernon Huxley, MD;  Location: [place] [facility];  Service: Cardiovascular;  Laterality: Left;   LOWER EXTREMITY ANGIOGRAPHY Right 08/04/2021   Procedure: LOWER EXTREMITY ANGIOGRAPHY;  Surgeon: Algernon Huxley, MD;  Location: [place] [facility];  Service: Cardiovascular;  Laterality: Right;   LOWER EXTREMITY ANGIOGRAPHY Left 11/15/2021   Procedure: LOWER EXTREMITY ANGIOGRAPHY;  Surgeon: Algernon Huxley, MD;  Location: [place] [facility];  Service: Cardiovascular;  Laterality: Left;   pd cath  05/21/2019   TENDON LENGTHENING Left 09/08/2021   Procedure: TENDON ACHILLES LENGTHENING;  Surgeon: Criselda Peaches, DPM;  Location: [facility];  Service: Podiatry;  Laterality: Left;   TONSILLECTOMY     TRANSMETATARSAL AMPUTATION Left 09/08/2021   Procedure: TRANSMETATARSAL AMPUTATION;  Surgeon: Criselda Peaches, DPM;  Location: [facility];  Service: Podiatry;  Laterality: Left;   UPPER EXTREMITY ANGIOGRAPHY Left 04/15/2021   Procedure: UPPER EXTREMITY ANGIOGRAPHY;  Surgeon: Algernon Huxley, MD;  Location: [place]  [facility];  Service: Cardiovascular;  Laterality: Left;   WOUND DEBRIDEMENT Bilateral 09/15/2019   Procedure: DEBRIDEMENT WOUND;  Surgeon: Benjamine Sprague, DO;  Location: [facility];  Service: General;  Laterality: Bilateral;    Physical  Exam: The patient is comatose/sedated in the ICU with oral endotracheal intubation.  She is not moving any extremities currently.  She is not responding in any way.  She is a very short fat neck with a large double chin.  He can feel some of the landmarks but there is very little room in her neck.  I cannot examine the mouth because of the oral tracheal tube.   A/P: Patient is vent dependent and has encephalopathy secondary to multiple embolic strokes.  She is in very critical condition with a poor prognosis.  She will need a tracheostomy tube to maintain ventilatory support and help her to wean from the ventilator and moved towards rehabilitation.  She has some urgent evaluations to be done in the next day or 2 and likely will be  1 week before we can get operating time scheduled for tracheostomy.  We will plan for sometime early to mid next week.  I will plan to discuss this with family in the future they understand the surgery and the potential risks, and the need to do this for maintaining her airway.   Elon Alas Monicia Tse 11/29/2021 6:07 PM

## 2021-11-29 NOTE — Consult Note (Addendum)
[place] for heparin Indication:  Thrombus  Allergies  Allergen Reactions   Percocet [Oxycodone-Acetaminophen] Itching   Tomato Itching   Gabapentin Other (See Comments)    Extremity tremors    Patient Measurements: Height: 5\' 6"  (167.6 cm) Weight: 131.5 kg (289 lb 14.5 oz) IBW/kg (Calculated) : 59.3 Heparin Dosing Weight: 92.3 Kg  Vital Signs: Temp: 97.6 F (36.4 C) (02/13 0900) Temp Source: Axillary (02/13 0900) Pulse Rate: 109 (02/13 0900)  Labs: Recent Labs    12/13/2021 2222 11/27/21 0528 11/27/21 0906 11/28/21 0602 11/28/21 1500 11/28/21 2356 11/29/21 0342 11/29/21 0916  HGB  --  8.6*  --  8.6*  --   --  8.5*  --   HCT  --  27.4*  --  27.4*  --   --  27.5*  --   PLT  --  242  --  253  --   --  274  --   APTT 38*  --   --   --   --   --   --   --   LABPROT 16.3*  --   --   --   --   --   --   --   INR 1.3*  --   --   --   --   --   --   --   HEPARINUNFRC <0.10*  --    < > 0.44 0.29* 0.36  --  0.40  CREATININE  --  12.01*  --  11.50*  --   --  11.44*  --    < > = values in this interval not displayed.     Estimated Creatinine Clearance: 9.5 mL/min (A) (by C-G formula based on SCr of 11.44 mg/dL (H)).   Medical History: Past Medical History:  Diagnosis Date   Anemia of chronic disease    Asthma    well controlled-no inhaler   Calciphylaxis 2020   CHF (congestive heart failure) ([place])    CKD (chronic kidney disease), stage III ([place])    Diabetes ([place])    type 2-diet controlled   Dyspnea    Hypertension    Morbid obesity ([place])    Nephrotic syndrome    PAD (peripheral artery disease) ([place])    Sepsis ([place]) 06/2021    Medications:  No chronic anticoagulation PTA Heparin Dosing Weight: 92.3 Kg  Assessment: Pharmacy has been consulted to initiate Heparin in 35yo patient admitted with stroke-like symptoms. Her symptoms began to resolve, but neurologist noted concern for lower extremity ischemia, likely embolic.    Goal of Therapy:  Heparin level 0.3-0.5 units/ml (Per Stroke Protocol) Monitor platelets by anticoagulation protocol: Yes  Date Time HL  Comment/rate 2/10 2222 HL < 0.10, subtherapeutic 2/11 0906 HL 0.12, subtherapeutic 2/11 2048 HL 0.22, subtherapeutic  2/12 0602 HL 0.44, therapeutic x 1 2/12 1500 HL 0.29, subtherapeutic; 2100>2200 un/hr 2/12 2356 HL 0.36, therapeutic x 1; 2200 un/hr 2/13 0916 HL 0.40, therapeutic x2; 2200 un/hr   Plan:  HL Therapeutic x2; currently 0.4 (goal: 0.3-0.5 un/mL) Continue heparin infusion at 2200 units/hr Recheck HL w/ AM labs since consecutively therapeutic Continue to monitor H&H and platelets daily while on heparin gtt  Lorna Dibble, PharmD, [facility] Clinical Pharmacist 11/29/2021 9:54 AM

## 2021-11-29 NOTE — Plan of Care (Addendum)
PMT note:  Spoke with CCM who is currently attending service for patient as she has now been intubated. Per discussion with CCM Darel Hong NP, goals are set and consult is no longer needed. Per conversation, will D/C consult. Please reconsult if needed.

## 2021-11-29 NOTE — Progress Notes (Signed)
SLP Cancellation Note  Patient Details Name: [name] MRN: [id number] DOB: 1986-07-07   Cancelled treatment:       Reason Eval/Treat Not Completed: Medical issues which prohibited therapy   Per chart review, pt intubated this AM. SLP to sign off given pt's change in medical status and inability to participate in skilled SLP services at this time. Dr. Loleta Books aware and in agreement.  Please reconsult should pt become medically ready for participation in skilled SLP services.  Cherrie Gauze, M.S., [facility] [phone] ([place])  Quintella Baton 11/29/2021, 1:01 PM

## 2021-11-29 NOTE — Progress Notes (Signed)
GOALS OF CARE DISCUSSION  The Clinical status was relayed to family in detail. Mother Francesca Jewett over the phone Sister Mardene Celeste   Updated and notified of patients medical condition.  Patient remains unresponsive and will not open eyes to command.   Patient is having a weak cough and struggling to remove secretions.   Patient with increased WOB and using accessory muscles to breathe Explained to family course of therapy and the modalities    Patient with Progressive multiorgan failure with a very high probablity of a very minimal chance of meaningful recovery despite all aggressive and optimal medical therapy.   PATIENT REMAINS FULL CODE High risk for intubation and cardiac arrest Plan for emergent intubation She will need [facility] as well, will consult ENT Plan for Vascular Surgery to assess ischemic leg, also place CVL's and Dialysis catheters   Family understands the situation but are naive  of morbid prognosis  Family are satisfied with Plan of action and management. All questions answered  Additional CC time 45 mins   Quentyn Kolbeck Patricia Pesa, M.D.  [facility]  Medical Director [place] Director [facility] Cardio-Pulmonary Department

## 2021-11-29 NOTE — Progress Notes (Signed)
[facility]  Daily Progress Note   Subjective  -   Remains intubated, minimally responsive at best. Feet remain mottled/cool Needs to start HD  Objective Vitals:   11/29/21 1200 11/29/21 1245 11/29/21 1300 11/29/21 1315  BP:      Pulse: 96 92 92 91  Resp: 16 19 (!) 21 (!) 21  Temp:      TempSrc:      SpO2:  96% 97% 97%  Weight:      Height:        Intake/Output Summary (Last 24 hours) at 11/29/2021 1323 Last data filed at 11/29/2021 1300 Gross per 24 hour  Intake 3301.64 ml  Output 14730 ml  Net -11428.36 ml    PULM  CTAB CV  RRR VASC  Feet cool, no pedal pulses appreciated  Laboratory CBC    Component Value Date/Time   WBC 23.3 (H) 11/29/2021 0342   HGB 8.5 (L) 11/29/2021 0342   HGB 12.5 12/31/2013 1020   HCT 27.5 (L) 11/29/2021 0342   HCT 38.5 12/31/2013 1020   PLT 274 11/29/2021 0342   PLT 239 12/31/2013 1020    BMET    Component Value Date/Time   NA 134 (L) 11/29/2021 0342   NA 135 (L) 12/31/2013 1020   K 3.5 11/29/2021 0342   K 3.9 12/31/2013 1020   CL 93 (L) 11/29/2021 0342   CL 105 12/31/2013 1020   CO2 15 (L) 11/29/2021 0342   CO2 26 12/31/2013 1020   GLUCOSE 152 (H) 11/29/2021 0342   GLUCOSE 243 (H) 12/31/2013 1020   BUN 64 (H) 11/29/2021 0342   BUN 19 (H) 12/31/2013 1020   CREATININE 11.44 (H) 11/29/2021 0342   CREATININE 2.12 (H) 12/31/2013 1020   CALCIUM 7.8 (L) 11/29/2021 0342   CALCIUM 8.5 12/31/2013 1020   GFRNONAA 4 (L) 11/29/2021 0342   GFRNONAA 31 (L) 12/31/2013 1020   GFRAA 5 (L) 06/18/2020 1217   GFRAA 36 (L) 12/31/2013 1020    Assessment/Planning:   ESRD, needs a temp cath for HD.  Will be placed today. Endocarditis versus atrial thrombus with embolization. Very poor prognosis.   CVA. Likely embolic. Not responsive.  Poor prognosis    [name]  11/29/2021, 1:23 PM

## 2021-11-29 NOTE — Progress Notes (Signed)
SLP Cancellation Note  Patient Details Name: [name] MRN: [id number] DOB: [birth year]   Cancelled treatment:       Reason Eval/Treat Not Completed: Fatigue/lethargy limiting ability to participate;Medical issues which prohibited therapy   Attempted clinical swallowing evaluation. Pt unable to rouse long enough for safe PO intake. Pt with RR of 35 at rest with audible respirations. SLP to defer clinical swallowing evaluation at this time. Recommend continuation of NPO with consideration for short term alternate route of nutrition. RN, Noberto Retort, made aware.  SLP to continue efforts as appropriate.   Cherrie Gauze, M.S., [facility] [phone] [place])  Quintella Baton 11/29/2021, 8:51 AM

## 2021-11-29 NOTE — Progress Notes (Signed)
Patients mother telephoned in for an update. All questions answered at this time. Reassured patients mother that we would call her if there were any changes. Patients mother expressed appreciation and gratitude.

## 2021-11-29 NOTE — Procedures (Signed)
Endotracheal Intubation: Patient required placement of an artificial airway secondary to unresponsive state   Consent: Obtained from pt's mother by Dr. Mortimer Fries.    Hand washing performed prior to starting the procedure.    Medications administered for sedation prior to procedure:  Fentanyl 100 mcg IV, 20 mg Etomidate IV, Rocuronium 50 mg IV     A time out procedure was called and correct patient, name, & ID confirmed. Needed supplies and equipment were assembled and checked to include ETT, 10 ml syringe, Glidescope, Mac and Miller blades, suction, oxygen and bag mask valve, end tidal CO2 monitor.    Patient was positioned to align the mouth and pharynx to facilitate visualization of the glottis.    Heart rate, SpO2 and blood pressure was continuously monitored during the procedure. Pre-oxygenation was conducted prior to intubation and endotracheal tube was placed through the vocal cords into the trachea.       The artificial airway was placed under direct visualization via glidescope route using a Size 8.0 ETT on the first attempt.   ETT was secured at 23 cm at the lip.   Placement was confirmed by auscuitation of lungs with good breath sounds bilaterally and no stomach sounds.  Condensation was noted on endotracheal tube.   Pulse ox 95%. CO2 detector in place with appropriate color change.    Complications: None .        Chest radiograph ordered and pending.     Darel Hong, AGACNP-BC  Pulmonary & Critical Care Prefer epic messenger for cross cover needs If after hours, please call E-link

## 2021-11-29 NOTE — Care Plan (Signed)
Patient tachypneic overnight despite dialysis overnight.  Then respiratory status deteriorated this morning.  Critical Care involved already, evaluated her and felt she required intubation.  Discussed with mother.   Hospitalist service will sign off.  [place], Nephrology, Cardiology, Neurology, and Vascular Surgery expertise.

## 2021-11-29 NOTE — Progress Notes (Signed)
NAME:  [name], MRN:  [id number], DOB:  1986/01/03, LOS: 3 ADMISSION DATE:  12/05/2021, CONSULTATION DATE:  11/27/21 REFERRING MD:  Dr. Loleta Books, CHIEF COMPLAINT:  CODE STROKE   SYNOPSIS  36 yo F presenting to [facility] ED with complaints of difficulty speaking & left sided hemiparesis around 11:30 AM on 11/22/2021.  Patient was originally scheduled for a vascular procedure on 11/24/2021 and prior to arriving she had taken antihypertensives at which point her mother states the patient became confused and had difficulty speaking as well as difficulty moving her left side.   ED course: CODE STROKE initiated patient evaluated by neurology.  SEVERE ENCEPHALOPATHY-MRI showed acute and subacute CVA in multiple vascular territories and the patient was admitted by [facility] for further stroke work-up.  It was also noted on admission the patient was pulseless in the right foot for which vascular surgery was consulted. medications given: Patient was started on heparin drip per vascular surgery recommendation of which neurology is aware. 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 new bony irregularity along the resection margins of the third, fourth and fifth metatarsals concerning for osteomyelitis MRI brain without contrast 12/11/2021: Subcentimeter acute cortically based infarcts with the lateral right frontal lobe and posterior left frontal lobe. patchy small acute infarcts within the callosal splenium on the right additional small recent infarcts (measuring up to 12 mm) are questioned within the left frontoparietal white matter given the small infarcts involving multiple vascular territories there is a suspicion for embolic process. small chronic cortical and subcortical infarct within the right parietoccipital lobes.  Chronic hemorrhagic  lacunar infarct within the left caudate nucleus.  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  Mild mucosal thickening within the bilateral ethmoid sinuses.  Trapped fluid within the left petrous apex.  Pertinent  Medical History  PAD Anemia of chronic disease T2DM Asthma ESRD on PD HFpEF HTN Obesity calciphylaxis  Significant Hospital Events: Including procedures, antibiotic start and stop dates in addition to other pertinent events   2/10 TRH  admission and neurology consulted for acute /subacute multiple infarcts.  Admit to inpatient as CODE STROKE with multiple acute/subacute infarcts on MRI, ESRD on PD, acute RLE ischemia & suspected osteomyelitis on L foot with Neurology, Nephrology & Vascular surgery following Vascular surgery consulted for assistance with right lower extremity ischemia.  Patient was started on heparin drip with additional imaging and work-up for acute and subacute CVA.  Antibiotics started for concerns of osteomyelitis in the foot. COVID +  2/11 Echocardiogram LVEF 55 to 60%, no regional wall motion abnormalities, mild concentric LVH, elevated LA pressure, RV systolic function is normal, LA size severely dilated. Restricted anterior and posterior mitral valve leaflet motion compared with the echo in 2017 with concern for endocarditis. Moderate MR, Severe MS, moderate-severe TR, no AS.   11/27/21: Early in the day neurology documented the patient has aphasic but inact in terms of following commands, with improved left sided hemiparesis and new right arm drift. Per TRH documentation, patient received Dilaudid for severe pain in ischemic LE, then waking up from a nap more confused with more Right sided hemiparesis. STAT CTH ordered. While in CT patient became acutely hypoxic when laid flat, desaturations in the 70's. Rapid response called and patient transferred to SDU for monitoring.  2/11 PCCM consulted for additional management and monitoring  while in  SDU, developed hypoxia while laying flat for CT scan  11/28/21- possible endocarditis.  She also has severe comorbid conditions that require multiple specialists involved. Together the entire medical team reviewed plan and agreement to transfer to [facility] for additional evaluation and management. reviewed medical plan with family including mother [name].  2/13 remains encephalopathic, increased WOB   Interim History / Subjective:  Remains encephalopathic Non verbal +brain damage from strokes Not on pressors Not on oxygen +resp distress High risk for intubation and cardiac arrest  Objective   Blood pressure (!) 127/103, pulse 95, temperature (!) 97.3 F (36.3 C), temperature source Oral, resp. rate (!) 29, height 5\' 6"  (1.676 m), weight 131.5 kg, SpO2 (!) 86 %.        Intake/Output Summary (Last 24 hours) at 11/29/2021 0743 Last data filed at 11/29/2021 0100 Gross per 24 hour  Intake 16126.69 ml  Output 14545 ml  Net 1581.69 ml    Filed Weights   11/27/21 0100 11/27/21 1628 11/28/21 0500  Weight: 131.1 kg 133.4 kg 131.5 kg   REVIEW OF SYSTEMS  PATIENT IS UNABLE TO PROVIDE COMPLETE REVIEW OF SYSTEMS DUE TO  TOXIC METABOLIC ENCEPHALOPATHY   PHYSICAL EXAMINATION:  GENERAL:critically ill appearing, +resp distress EYES: Pupils equal, round, reactive to light.  No scleral icterus.  MOUTH: Moist mucosal membrane.  NECK: Supple.  PULMONARY: +rhonchi, CARDIOVASCULAR: S1 and S2.  No murmurs  GASTROINTESTINAL: Soft, nontender, -distended. Positive bowel sounds.  MUSCULOSKELETAL: No swelling, clubbing, or edema.  NEUROLOGIC: opens eyes to her name, only verbalizing in moans, withdraws to pain in all extremities, unable to follow commands SKIN:intact,warm,dry  Assessment & Plan:   36 yo morbidly obese AAF Admitted to inpatient as CODE STROKE with multiple acute/subacute infarcts  found on MRI,with brain damage and toxic encephalopathy with ESRD on PD, acute  RLE ischemia & suspected osteomyelitis on L foot possible endocarditis with progressive resp failure  Severe ACUTE Hypoxic and Hypercapnic Respiratory Failure Plan for emergent intubation   Multiple Acute/subacute infarcts  +brain damage - Neurology following, appreciate input - MRA, carotid US pending High risk for aspiration will  need [facility]  INFECTIOUS DISEASE LLL OSTEO COVID 19 infection -continue antibiotics as prescribed -follow up cultures -follow up ID consultation Will need TEE to assess for endocarditis   Acute Hypoxic Respiratory Failure - COVID-19 infection  High risk for intubation and cardiac arrest   ACUTE KIDNEY INJURY/Renal Failure On PD -continue Foley Catheter-assess need -Avoid nephrotoxic agents -Follow urine output, BMP -Ensure adequate renal perfusion, optimize oxygenation -Renal dose medications May need HD   Intake/Output Summary (Last 24 hours) at 11/29/2021 1050 Last data filed at 11/29/2021 1000 Gross per 24 hour  Intake 3037.05 ml  Output 14730 ml  Net -11692.95 ml    Ischemic RLE in the setting of severe PAD - vascular surgery following, appreciate input - on heparin drip per pharmacy protocol  Best Practice (right click and "Reselect all SmartList Selections" daily)  Diet/type: NPO DVT prophylaxis: systemic heparin GI prophylaxis: PPI Lines: N/A Foley:  N/A Code Status:  full code Last date of multidisciplinary goals of care discussion [per primary service]  Labs   CBC: Recent Labs  Lab 12/14/2021 1320 12/13/2021 1412 11/27/21 0528 11/28/21 0602 11/29/21 0342  WBC 17.2* 16.6* 14.4* 17.4* 23.3*  NEUTROABS 14.0* 14.0* 11.8*  --   --   HGB 9.2* 9.4* 8.6* 8.6* 8.5*  HCT 28.4* 29.6* 27.4* 27.4* 27.5*  MCV 99.6 98.7 100.4* 100.4* 100.0  PLT 280  247 242 253 274     Basic Metabolic Panel: Recent Labs  Lab 11/24/21 1005 12/12/2021 1412 11/27/21 0528 11/28/21 0602 11/29/21 0342  NA 136 140 138 136 134*  K 3.1* 3.8 4.1 4.0  3.5  CL 94* 94* 94* 93* 93*  CO2 19* 20* 17* 18* 15*  GLUCOSE 110* 122* 98 108* 152*  BUN 69* 65* 67* 67* 64*  CREATININE 12.79* 11.34* 12.01* 11.50* 11.44*  CALCIUM 7.8* 8.3* 8.2* 7.9* 7.8*  MG  --   --   --  1.5* 1.6*  PHOS  --   --   --  10.0* 9.3*    GFR: Estimated Creatinine Clearance: 9.5 mL/min (A) (by C-G formula based on SCr of 11.44 mg/dL (H)). Recent Labs  Lab 12/03/2021 1412 11/27/21 0528 11/28/21 0602 11/29/21 0342  WBC 16.6* 14.4* 17.4* 23.3*     Liver Function Tests: Recent Labs  Lab 12/13/2021 1412 11/27/21 0528 11/28/21 0602 11/29/21 0342  AST 11* 11* 11* 12*  ALT 7 6 6  <5  ALKPHOS 131* 124 120 132*  BILITOT 1.0 0.7 1.0 1.0  PROT 7.1 6.6 6.5 6.6  ALBUMIN 2.8* 2.7* 2.8* 2.7*    No results for input(s): LIPASE, AMYLASE in the last 168 hours. No results for input(s): AMMONIA in the last 168 hours.  ABG    Component Value Date/Time   PHART 7.32 (L) 11/27/2021 1449   PCO2ART 26 (L) 11/27/2021 1449   PO2ART 106 11/27/2021 1449   HCO3 13.4 (L) 11/27/2021 1449   TCO2 26 09/08/2021 1418   ACIDBASEDEF 11.3 (H) 11/27/2021 1449   O2SAT 97.6 11/27/2021 1449      Coagulation Profile: Recent Labs  Lab 11/30/2021 2222  INR 1.3*     HbA1C: Hgb A1c MFr Bld  Date/Time Value Ref Range Status  11/25/2021 06:30 PM 5.1 4.8 - 5.6 % Final    Comment:    (NOTE) Pre diabetes:          5.7%-6.4%  Diabetes:              >6.4%  Glycemic control for   <7.0% adults with diabetes   11/24/2021 10:05 AM 5.0 4.8 - 5.6 % Final    Comment:    (NOTE) Pre diabetes:          5.7%-6.4%  Diabetes:              >6.4%  Glycemic control for   <7.0% adults with diabetes     CBG: Recent Labs  Lab 11/27/21 1629 11/27/21 1902 11/28/21 0034 11/28/21 0524 11/28/21 2338  GLUCAP 75 81 108* 88 122*     Home Medications  Prior to Admission medications   Medication Sig Start Date End Date Taking? Authorizing Provider  amLODipine (NORVASC) 10 MG tablet Take 10  mg by mouth daily as needed (elevated blood pressure).   Yes [provider]  aspirin EC 81 MG EC tablet Take 1 tablet (81 mg total) by mouth daily. 02/16/16  Yes Demetrios Loll, MD  atorvastatin (LIPITOR) 20 MG tablet Take 1 tablet (20 mg total) by mouth daily. 11/15/21  Yes Dew, Erskine Squibb, MD  carvedilol (COREG) 25 MG tablet Take 25 mg by mouth 2 (two) times daily as needed (elevated blood pressure).   Yes [provider]  clopidogrel (PLAVIX) 75 MG tablet Take 1 tablet (75 mg total) by mouth daily. 11/15/21  Yes Dew, Erskine Squibb, MD  isosorbide mononitrate (IMDUR) 30 MG 24 hr tablet Take 30 mg by  mouth 2 (two) times daily as needed (elevated blood pressure).   Yes [provider]  losartan (COZAAR) 100 MG tablet Take 100 mg by mouth daily as needed (elevated blood pressure).   Yes [provider]  cilostazol (PLETAL) 100 MG tablet Take 1 tablet (100 mg total) by mouth 2 (two) times daily before a meal. Patient not taking: Reported on 11/18/2021 05/07/21   Kris Hartmann, NP  lactulose (CHRONULAC) 10 GM/15ML solution Take 30 mLs (20 g total) by mouth 2 (two) times daily as needed for mild constipation. Patient not taking: Reported on 12/02/2021 07/09/21   Edwin Dada, MD  multivitamin (RENA-VIT) TABS tablet Take 1 tablet by mouth at bedtime. Patient not taking: Reported on 11/15/2021 09/18/19   Fritzi Mandes, MD  ondansetron (ZOFRAN) 4 MG tablet Take 1 tablet (4 mg total) by mouth every 6 (six) hours as needed for nausea. Patient not taking: Reported on 11/09/2021 09/08/21   Criselda Peaches, DPM      DVT/GI PRX  assessed I Assessed the need for Labs I Assessed the need for Foley I Assessed the need for Central Venous Line Family Discussion when available I Assessed the need for Mobilization I made an Assessment of medications to be adjusted accordingly Safety Risk assessment completed  CASE DISCUSSED IN MULTIDISCIPLINARY ROUNDS WITH ICU TEAM     Critical  Care Time devoted to patient care services described in this note is 55 minutes.  Critical care was necessary to treat /prevent imminent and life-threatening deterioration. Overall, patient is critically ill, prognosis is guarded.  Patient with Multiorgan failure and at high risk for cardiac arrest and death.    Corrin Parker, M.D.  [facility]  Medical Director [place] Director [facility] Cardio-Pulmonary Department

## 2021-11-29 NOTE — Progress Notes (Signed)
Patients mother updated via telephone.

## 2021-11-29 NOTE — Consult Note (Signed)
[place] for heparin Indication:  Thrombus  Allergies  Allergen Reactions   Percocet [Oxycodone-Acetaminophen] Itching   Tomato Itching   Gabapentin Other (See Comments)    Extremity tremors    Patient Measurements: Height: 5\' 6"  (167.6 cm) Weight: 131.5 kg (289 lb 14.5 oz) IBW/kg (Calculated) : 59.3 Heparin Dosing Weight: 92.3 Kg  Vital Signs: Temp: 97.5 F (36.4 C) (02/12 2000) Temp Source: Axillary (02/12 2000) Pulse Rate: 66 (02/12 2353)  Labs: Recent Labs    11/28/2021 1412 11/28/2021 2222 11/27/21 0528 11/27/21 0906 11/28/21 0602 11/28/21 1500 11/28/21 2356  HGB 9.4*  --  8.6*  --  8.6*  --   --   HCT 29.6*  --  27.4*  --  27.4*  --   --   PLT 247  --  242  --  253  --   --   APTT  --  38*  --   --   --   --   --   LABPROT  --  16.3*  --   --   --   --   --   INR  --  1.3*  --   --   --   --   --   HEPARINUNFRC  --  <0.10*  --    < > 0.44 0.29* 0.36  CREATININE 11.34*  --  12.01*  --  11.50*  --   --    < > = values in this interval not displayed.     Estimated Creatinine Clearance: 9.4 mL/min (A) (by C-G formula based on SCr of 11.5 mg/dL (H)).   Medical History: Past Medical History:  Diagnosis Date   Anemia of chronic disease    Asthma    well controlled-no inhaler   Calciphylaxis 2020   CHF (congestive heart failure) ([place])    CKD (chronic kidney disease), stage III ([place])    Diabetes ([place])    type 2-diet controlled   Dyspnea    Hypertension    Morbid obesity ([place])    Nephrotic syndrome    PAD (peripheral artery disease) ([place])    Sepsis ([place]) 06/2021    Medications:  No chronic anticoagulation PTA  Assessment: Pharmacy has been consulted to initiate Heparin in 35yo patient admitted with stroke-like symptoms. Her symptoms began to resolve, but neurologist noted concern for lower extremity ischemia, likely embolic.   Goal of Therapy:  Heparin level 0.3-0.5 units/ml (Per Stroke Protocol) Monitor  platelets by anticoagulation protocol: Yes  2/10 2222 HL < 0.10, subtherapeutic 2/11 0906 HL 0.12, subtherapeutic 2/11 2048 HL 0.22, subtherapeutic  2/12 0602 HL 0.44, therapeutic x 1 2/12 1500 HL 0.29, subtherapeutic 2/12 2356 HL 0.36, therapeutic x 1   Plan:  Continue heparin infusion at 2200 units/hr Recheck HL in 8 hours to confirm Continue to monitor H&H and platelets  Renda Rolls, PharmD, [facility] 11/29/2021 1:08 AM

## 2021-11-29 NOTE — Plan of Care (Signed)
Patient continues to be nonverbal, moaning and grunting. Pupils equal and reactive. Patient did attempt to say "no" when asked if she was having pain? Patients O2 began to drop on 3L [place], O2 increased to 6 L with no improvement. RT notified and patient and was then placed on bubble HFNC at 10L and was then able to sustain O2 sats > 90%  Wet to dry dressing changed to left lower extremity, patient tolerated well. Patient having pain to left lower extremity, Fentanyl PRN for pain X's 2 this shift. Patient continues to move BL upper and lower extremities with little to no movement to RUE, patient will move RUE to pain stimulus. Patient continues to be on a heparin gtt @ 22 ml/hr and D10 at 25 ml/hr. Patient is a high fall risk patient, bed alarm on and padded floors provided for safety. A-line correlates well with good waveform. NIHSS shift total is 25 and modified SS is 18. Mother updated X's 2 this shift. Mother also reported that the patient had been to her PCP and been told that she had a "female infection" and was not treated prior to this admission because the patient had a stroke and had to come to hospital. NP notified and new order entered for genital wet prep. Patient has been NSR this shift.

## 2021-11-29 NOTE — Op Note (Signed)
°  OPERATIVE NOTE   PROCEDURE: Ultrasound guidance for vascular access right femoral vein Placement of a 30 cm triple lumen dialysis catheter right femoral vein  PRE-OPERATIVE DIAGNOSIS: 1. ESRD   POST-OPERATIVE DIAGNOSIS: Same  SURGEON: [name], MD  ASSISTANT(S): None  ANESTHESIA: local  ESTIMATED BLOOD LOSS: Minimal   FINDING(S): 1.  None  SPECIMEN(S):  None  INDICATIONS:    Patient is a 36 y.o.female who presents with CVA and atrial thrombus and needs to transition from PD to HD and needs a catheter.  Risks and benefits were discussed, and informed consent was obtained..  DESCRIPTION: After obtaining full informed written consent, the patient was laid flat in the bed.  The right groin was sterilely prepped and draped in a sterile surgical field was created. The right femoral vein was visualized with ultrasound and found to be widely patent. It was then accessed under direct guidance without difficulty with a Seldinger needle and a permanent image was recorded. A J-wire was then placed. After skin nick and dilatation, a 30 cm triple lumen dialysis catheter was placed over the wire and the wire was removed. The lumens withdrew dark red nonpulsatile blood and flushed easily with sterile saline. The catheter was secured to the skin with 3 nylon sutures. Sterile dressing was placed.  COMPLICATIONS: None  CONDITION: Stable  [name] 11/29/2021 1:26 PM  This note was created with Dragon Medical transcription system. Any errors in dictation are purely unintentional.

## 2021-11-29 NOTE — Consult Note (Signed)
Pharmacy Antibiotic Note  [name] is a 36 y.o. female admitted on 12/12/2021 with left foot wound infection. Patient had outpatient left foot debridement scheduled for today which was canceled due to concern for stroke. Pharmacy has been consulted for Vancomycin & Cefepime dosing. Patient is ESRD on PD.   Plan: Cefepime 1 g IV q24h (2/10 >>  Vancomycin 2g IV x1 (2/11 1311) A Random Level will be collected b/n 3-4 days from first loading dose (b/n 2/14-2/15 @ 1300) Based upon result, use an ~15mg /kg maintenance dose = Vancomycin IV 2g x1.  Can redose when Random level is <20 mcg/mL to maintain > 83mcg/mL Monitor clinical picture, F/U C&S, abx deescalation / LOT   Temp (24hrs), Avg:97.6 F (36.4 C), Min:96.5 F (35.8 C), Max:98.7 F (37.1 C)  Recent Labs  Lab 11/24/21 1005 11/20/2021 1320 11/21/2021 1412 11/27/21 0528 11/28/21 0602 11/29/21 0342  WBC 16.9* 17.2* 16.6* 14.4* 17.4* 23.3*  CREATININE 12.79*  --  11.34* 12.01* 11.50* 11.44*     Estimated Creatinine Clearance: 9.5 mL/min (A) (by C-G formula based on SCr of 11.44 mg/dL (H)).    Allergies  Allergen Reactions   Percocet [Oxycodone-Acetaminophen] Itching   Tomato Itching   Gabapentin Other (See Comments)    Extremity tremors    Antimicrobials this admission: 2/10 cefepime >>  2/11 Vancomycin >>  Dose adjustments this admission: N/A; CTM since on ESRD-PD dosing  Microbiology results: No cultures have bee ordered at this time  Thank you for allowing pharmacy to be a part of this patients care.  Lorna Dibble, PharmD, [facility] Clinical Pharmacist 11/29/2021 11:23 AM

## 2021-11-29 NOTE — Progress Notes (Addendum)
Progress Note  Patient Name: [name] Date of Encounter: 11/29/2021  [facility] Cardiologist:New  Subjective   Patient still with AMS, able to open eyes. Family is not at bedside.   Inpatient Medications    Scheduled Meds:  aspirin  300 mg Rectal Daily   Or   aspirin  325 mg Oral Daily   chlorhexidine  15 mL Mouth Rinse BID   Chlorhexidine Gluconate Cloth  6 each Topical Daily   collagenase   Topical Daily   gentamicin cream  1 application Topical Daily   mouth rinse  15 mL Mouth Rinse q12n4p   pantoprazole (PROTONIX) IV  40 mg Intravenous Q24H   Continuous Infusions:  ceFEPime (MAXIPIME) IV 200 mL/hr at 11/29/21 0100   dextrose 25 mL/hr at 11/29/21 0100   dialysis solution 1.5% low-MG/low-CA     heparin 2,200 Units/hr (11/29/21 0338)   remdesivir 100 mg in NS 100 mL Stopped (11/28/21 1158)   PRN Meds: acetaminophen **OR** acetaminophen (TYLENOL) oral liquid 160 mg/5 mL **OR** acetaminophen, fentaNYL (SUBLIMAZE) injection, hydrALAZINE   Vital Signs    Vitals:   11/29/21 0545 11/29/21 0600 11/29/21 0615 11/29/21 0630  BP:      Pulse:      Resp: (!) 25 (!) 24 19 (!) 29  Temp:      TempSrc:      SpO2:      Weight:      Height:        Intake/Output Summary (Last 24 hours) at 11/29/2021 0736 Last data filed at 11/29/2021 0100 Gross per 24 hour  Intake 16126.69 ml  Output 14545 ml  Net 1581.69 ml   Last 3 Weights 11/28/2021 11/27/2021 11/27/2021  Weight (lbs) 289 lb 14.5 oz 294 lb 1.5 oz 289 lb 0.4 oz  Weight (kg) 131.5 kg 133.4 kg 131.1 kg      Telemetry    ST, HR 90-105 - Personally Reviewed  ECG    No new - Personally Reviewed  Physical Exam   GEN: critically ill Neck: difficult to assess JVD Cardiac: RRR, + murmur, no rubs, or gallops.  Respiratory: course breath sounds. GI: Soft, nontender, non-distended  MS: No edema; No deformity. Neuro:  unable to assess Psych: unable to assess  Labs    High Sensitivity Troponin:  No  results for input(s): TROPONINIHS in the last 720 hours.   Chemistry Recent Labs  Lab 11/27/21 0528 11/28/21 0602 11/29/21 0342  NA 138 136 134*  K 4.1 4.0 3.5  CL 94* 93* 93*  CO2 17* 18* 15*  GLUCOSE 98 108* 152*  BUN 67* 67* 64*  CREATININE 12.01* 11.50* 11.44*  CALCIUM 8.2* 7.9* 7.8*  MG  --  1.5* 1.6*  PROT 6.6 6.5 6.6  ALBUMIN 2.7* 2.8* 2.7*  AST 11* 11* 12*  ALT 6 6 <5  ALKPHOS 124 120 132*  BILITOT 0.7 1.0 1.0  GFRNONAA 4* 4* 4*  ANIONGAP 27* 25* 26*    Lipids  Recent Labs  Lab 12/07/2021 1830  CHOL 125  TRIG 73  HDL 34*  LDLCALC 76  CHOLHDL 3.7    Hematology Recent Labs  Lab 11/27/21 0528 11/28/21 0602 11/29/21 0342  WBC 14.4* 17.4* 23.3*  RBC 2.73* 2.73* 2.75*  HGB 8.6* 8.6* 8.5*  HCT 27.4* 27.4* 27.5*  MCV 100.4* 100.4* 100.0  MCH 31.5 31.5 30.9  MCHC 31.4 31.4 30.9  RDW 17.9* 18.0* 18.0*  PLT 242 253 274   Thyroid No results for input(s): TSH,  FREET4 in the last 168 hours.  BNPNo results for input(s): BNP, PROBNP in the last 168 hours.  DDimer  Recent Labs  Lab 12/11/2021 2222 11/27/21 0528  DDIMER 0.72* 0.61*     Radiology    CT HEAD WO CONTRAST (5MM)  Result Date: 11/27/2021 CLINICAL DATA:  Right-sided weakness.  Aphasia. EXAM: CT HEAD WITHOUT CONTRAST TECHNIQUE: Contiguous axial images were obtained from the base of the skull through the vertex without intravenous contrast. RADIATION DOSE REDUCTION: This exam was performed according to the departmental dose-optimization program which includes automated exposure control, adjustment of the mA and/or kV according to patient size and/or use of iterative reconstruction technique. COMPARISON:  12/10/2021. FINDINGS: Brain: No evidence of acute infarction, hemorrhage, hydrocephalus, extra-axial collection or mass lesion/mass effect. Small infarcts described on the previous day's exam less well-defined due to patient motion on the current study. Vascular: No hyperdense vessel or unexpected  calcification. Skull: No fracture. No discrete bone lesion. Large right frontal osteoma, stable. Sinuses/Orbits: Globes and orbits are unremarkable. Sinuses are clear. Other: None. IMPRESSION: 1. No acute intracranial abnormalities. No change from the previous day's study. Electronically Signed   By: Lajean Manes M.D.   On: 11/27/2021 15:15   US Venous Img Lower Bilateral (DVT)  Result Date: 11/29/2021 CLINICAL DATA:  Acute ischemic stroke, bilateral lower extremity swelling. Assess for DVT. EXAM: BILATERAL LOWER EXTREMITY VENOUS DOPPLER ULTRASOUND TECHNIQUE: Gray-scale sonography with graded compression, as well as color Doppler and duplex ultrasound were performed to evaluate the lower extremity deep venous systems from the level of the common femoral vein and including the common femoral, femoral, profunda femoral, popliteal and calf veins including the posterior tibial, peroneal and gastrocnemius veins when visible. The superficial great saphenous vein was also interrogated. Spectral Doppler was utilized to evaluate flow at rest and with distal augmentation maneuvers in the common femoral, femoral and popliteal veins. COMPARISON:  None. FINDINGS: RIGHT LOWER EXTREMITY Common Femoral Vein: No evidence of thrombus. Normal compressibility, respiratory phasicity and response to augmentation. Saphenofemoral Junction: No evidence of thrombus. Normal compressibility and flow on color Doppler imaging. Profunda Femoral Vein: No evidence of thrombus. Normal compressibility and flow on color Doppler imaging. Femoral Vein: No evidence of thrombus. Normal compressibility, respiratory phasicity and response to augmentation. Popliteal Vein: No evidence of thrombus. Normal compressibility, respiratory phasicity and response to augmentation. Calf Veins: Limited visualization of the calf veins. No obvious thrombus. Superficial Great Saphenous Vein: No evidence of thrombus. Normal compressibility. Venous Reflux:  None. Other  Findings:  None. LEFT LOWER EXTREMITY Common Femoral Vein: No evidence of thrombus. Normal compressibility, respiratory phasicity and response to augmentation. Saphenofemoral Junction: No evidence of thrombus. Normal compressibility and flow on color Doppler imaging. Profunda Femoral Vein: No evidence of thrombus. Normal compressibility and flow on color Doppler imaging. Femoral Vein: No evidence of thrombus. Normal compressibility, respiratory phasicity and response to augmentation. Popliteal Vein: No evidence of thrombus. Normal compressibility, respiratory phasicity and response to augmentation. Calf Veins: Limited visualization of the calf veins. No obvious thrombus. Superficial Great Saphenous Vein: No evidence of thrombus. Normal compressibility. Venous Reflux:  None. Other Findings:  None. IMPRESSION: No evidence of deep venous thrombosis in either lower extremity to the level of the knees. Calf veins are not well seen due to patient body habitus and subcutaneous edema. Electronically Signed   By: Jacqulynn Cadet M.D.   On: 11/29/2021 07:06   DG Chest Port 1 View  Result Date: 11/27/2021 CLINICAL DATA:  Hypoxia. EXAM: PORTABLE CHEST  1 VIEW COMPARISON:  12/10/2021 FINDINGS: Cardiac silhouette mildly enlarged.  No mediastinal or hilar masses. Subtle hazy airspace opacity suggested in the left mid to upper lung. Remainder of the lungs is clear. No convincing pleural effusion or pneumothorax. Skeletal structures are grossly intact. IMPRESSION: 1. Subtle hazy airspace adjusted in the left mid to upper lung, which appears to be a change from the previous day's exam. This is consistent with pneumonia. Electronically Signed   By: Lajean Manes M.D.   On: 11/27/2021 15:17   ECHOCARDIOGRAM COMPLETE  Result Date: 11/27/2021    ECHOCARDIOGRAM REPORT   Patient Name:   [name] Date of Exam: 11/27/2021 Medical Rec #:  [id number]         Height:       66.0 in Accession #:    [id number]        Weight:        289.0 lb Date of Birth:  [birth year]         BSA:          2.339 m Patient Age:    32 years          BP:           104/91 mmHg Patient Gender: F                 HR:           107 bpm. Exam Location:  [facility] Procedure: 2D Echo Indications:     Stroke I63.9  History:         Patient has prior history of Echocardiogram examinations, most                  recent 02/12/2016.  Sonographer:     Kathlen Brunswick RDCS Referring Phys:  [place] Diagnosing Phys: Skeet Latch MD  Sonographer Comments: Image acquisition challenging due to uncooperative patient. Non-verbal uncooperative patient. IMPRESSIONS  1. Left ventricular ejection fraction, by estimation, is 55 to 60%. The left ventricle has normal function. The left ventricle has no regional wall motion abnormalities. There is mild concentric left ventricular hypertrophy. Left ventricular diastolic parameters are indeterminate. Elevated left atrial pressure.  2. Right ventricular systolic function is normal. The right ventricular size is normal.  3. Left atrial size was severely dilated.  4. Restricted anterior and posterior mitral valve leaflet motion. Compared with the echo in 2017, the mitral valve is now heavily thickened and clacified. These findings are all new. Could represent endocarditis. Recommend TEE to better evaluate. The mitral valve is degenerative. Moderate mitral valve regurgitation. Severe mitral stenosis. Severe mitral annular calcification.  5. Tricuspid valve regurgitation is moderate to severe.  6. The aortic valve is tricuspid. Aortic valve regurgitation is not visualized. No aortic stenosis is present.  7. The inferior vena cava is normal in size with greater than 50% respiratory variability, suggesting right atrial pressure of 3 mmHg. FINDINGS  Left Ventricle: Left ventricular ejection fraction, by estimation, is 55 to 60%. The left ventricle has normal function. The left ventricle has no regional wall motion abnormalities. The left  ventricular internal cavity size was normal in size. There is  mild concentric left ventricular hypertrophy. Left ventricular diastolic parameters are indeterminate. Elevated left atrial pressure. Right Ventricle: The right ventricular size is normal. No increase in right ventricular wall thickness. Right ventricular systolic function is normal. Left Atrium: Left atrial size was severely dilated. Right Atrium: Right atrial size was normal in size. Pericardium: There is  no evidence of pericardial effusion. Mitral Valve: Restricted anterior and posterior mitral valve leaflet motion. Compared with the echo in 2017, the mitral valve is now heavily thickened and clacified. These findings are all new. Could represent endocarditis. Recommend TEE to better evaluate. The mitral valve is degenerative in appearance. There is severe thickening of the anterior mitral valve leaflet(s). There is severe calcification of the anterior mitral valve leaflet(s). Severe mitral annular calcification. Moderate mitral valve regurgitation. Severe mitral valve stenosis. MV peak gradient, 24.8 mmHg. The mean mitral valve gradient is 15.0 mmHg. Tricuspid Valve: The tricuspid valve is normal in structure. Tricuspid valve regurgitation is moderate to severe. No evidence of tricuspid stenosis. Aortic Valve: The aortic valve is tricuspid. Aortic valve regurgitation is not visualized. No aortic stenosis is present. Aortic valve peak gradient measures 6.0 mmHg. Pulmonic Valve: The pulmonic valve was normal in structure. Pulmonic valve regurgitation is not visualized. No evidence of pulmonic stenosis. Aorta: The aortic root is normal in size and structure. Venous: The inferior vena cava is normal in size with greater than 50% respiratory variability, suggesting right atrial pressure of 3 mmHg. IAS/Shunts: No atrial level shunt detected by color flow Doppler.  LEFT VENTRICLE PLAX 2D LVIDd:         4.94 cm      Diastology LVIDs:         3.45 cm      LV  e' medial:    6.42 cm/s LV PW:         1.30 cm      LV E/e' medial:  30.8 LV IVS:        1.22 cm      LV e' lateral:   5.44 cm/s LVOT diam:     2.00 cm      LV E/e' lateral: 36.4 LV SV:         43 LV SV Index:   18 LVOT Area:     3.14 cm  LV Volumes (MOD) LV vol d, MOD A4C: 105.0 ml LV vol s, MOD A4C: 33.0 ml LV SV MOD A4C:     105.0 ml RIGHT VENTRICLE RV Basal diam:  3.61 cm RV S prime:     11.10 cm/s TAPSE (M-mode): 1.5 cm LEFT ATRIUM              Index        RIGHT ATRIUM           Index LA diam:        3.70 cm  1.58 cm/m   RA Area:     18.10 cm LA Vol (A2C):   60.0 ml  25.66 ml/m  RA Volume:   46.90 ml  20.06 ml/m LA Vol (A4C):   100.0 ml 42.76 ml/m LA Biplane Vol: 80.5 ml  34.42 ml/m  AORTIC VALVE AV Area (Vmax): 2.32 cm AV Vmax:        122.00 cm/s AV Peak Grad:   6.0 mmHg LVOT Vmax:      90.10 cm/s LVOT Vmean:     62.400 cm/s LVOT VTI:       0.137 m  AORTA Ao Root diam: 2.40 cm Ao Asc diam:  2.90 cm MITRAL VALVE                TRICUSPID VALVE MV Area (PHT): 1.80 cm     TV Peak grad:   53.1 mmHg MV Area VTI:   0.91 cm     TV Vmax:  3.64 m/s MV Peak grad:  24.8 mmHg    TR Peak grad:   54.8 mmHg MV Mean grad:  15.0 mmHg    TR Vmax:        370.00 cm/s MV Vmax:       2.49 m/s MV Vmean:      182.5 cm/s   SHUNTS MV Decel Time: 421 msec     Systemic VTI:  0.14 m MR Peak grad: 86.5 mmHg     Systemic Diam: 2.00 cm MR Mean grad: 56.0 mmHg MR Vmax:      465.00 cm/s MR Vmean:     352.0 cm/s MV E velocity: 198.00 cm/s Skeet Latch MD Electronically signed by Skeet Latch MD Signature Date/Time: 11/27/2021/3:04:41 PM    Final     Cardiac Studies   Echo 11/27/21  1. Left ventricular ejection fraction, by estimation, is 55 to 60%. The  left ventricle has normal function. The left ventricle has no regional  wall motion abnormalities. There is mild concentric left ventricular  hypertrophy. Left ventricular diastolic  parameters are indeterminate. Elevated left atrial pressure.   2. Right  ventricular systolic function is normal. The right ventricular  size is normal.   3. Left atrial size was severely dilated.   4. Restricted anterior and posterior mitral valve leaflet motion.  Compared with the echo in 2017, the mitral valve is now heavily thickened  and clacified. These findings are all new. Could represent endocarditis.  Recommend TEE to better evaluate. The  mitral valve is degenerative. Moderate mitral valve regurgitation. Severe  mitral stenosis. Severe mitral annular calcification.   5. Tricuspid valve regurgitation is moderate to severe.   6. The aortic valve is tricuspid. Aortic valve regurgitation is not  visualized. No aortic stenosis is present.   7. The inferior vena cava is normal in size with greater than 50%  respiratory variability, suggesting right atrial pressure of 3 mmHg.   DVT study 11/28/21 IMPRESSION: No evidence of deep venous thrombosis in either lower extremity to the level of the knees. Calf veins are not well seen due to patient body habitus and subcutaneous edema.  Echo 2017 -------------------------------------------------------------------  Study Conclusions   - Left ventricle: The cavity size was at the upper limits of    normal. There was mild concentric hypertrophy. Systolic function    was normal. The estimated ejection fraction was in the range of    50% to 55%. Wall motion was normal; there were no regional wall    motion abnormalities. Doppler parameters are consistent with    abnormal left ventricular relaxation (grade 1 diastolic    dysfunction).  - Left atrium: The atrium was mildly dilated.  - Right ventricle: Systolic function was normal.  - Pulmonary arteries: Systolic pressure was within the normal    range.  - Pericardium, extracardiac: A small pericardial effusion was    identified.   Patient Profile     36 y.o. 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 Loleta Books.  Assessment & Plan    Severe Mitral stenosis Possible endocarditis - pt was scheduled for a vascular procedure on 2/10, however before the procedure she became acutely confused and had difficulty speaking. Code stroke called in  the ER. MRI showed acute and subacute strokes in multiple territories consistent with embolic disease. MRI positive for stroke, pulseless in the right foot, COVID positive started on IV heparin and IV abx with concern for osteomyelitis.  - Echo showed LVEF 55-60% with mild MVH, severe MV thickening with moderate mitral valve vegetation and severe mitral stenosis, with moderate mitral vegetation and severe mitral stenosis and moderate severe tricuspid regurgitation, mean gradient 44mmHg and valve area of 1.8cm by pressure half-time and 0.91 cm by the dimensionless index.  - Echo in 2017 with no significant valvular abnormalities.  - Patient needs TEE, however she is high risk of intubation. Can perform if mental status/respiratory status improves.  - Transfer to [facility] for CT surgery eval, however this was reportedly declined. 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 Repeat CT head was unchanged. She became hypoxic and was transferred to [facility]. CXR showed new pulmonary infiltrates. 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

## 2021-11-30 ENCOUNTER — Inpatient Hospital Stay: Payer: Medicare Other

## 2021-11-30 DIAGNOSIS — I050 Rheumatic mitral stenosis: Secondary | ICD-10-CM

## 2021-11-30 DIAGNOSIS — G9341 Metabolic encephalopathy: Secondary | ICD-10-CM | POA: Diagnosis not present

## 2021-11-30 DIAGNOSIS — U071 COVID-19: Secondary | ICD-10-CM | POA: Diagnosis not present

## 2021-11-30 DIAGNOSIS — R471 Dysarthria and anarthria: Secondary | ICD-10-CM | POA: Diagnosis not present

## 2021-11-30 DIAGNOSIS — R579 Shock, unspecified: Secondary | ICD-10-CM | POA: Diagnosis not present

## 2021-11-30 DIAGNOSIS — I739 Peripheral vascular disease, unspecified: Secondary | ICD-10-CM | POA: Diagnosis not present

## 2021-11-30 DIAGNOSIS — I6340 Cerebral infarction due to embolism of unspecified cerebral artery: Secondary | ICD-10-CM | POA: Diagnosis not present

## 2021-11-30 DIAGNOSIS — I639 Cerebral infarction, unspecified: Secondary | ICD-10-CM | POA: Diagnosis not present

## 2021-11-30 LAB — BLOOD GAS, ARTERIAL
Acid-base deficit: 9.3 mmol/L — ABNORMAL HIGH (ref 0.0–2.0)
Bicarbonate: 18 mmol/L — ABNORMAL LOW (ref 20.0–28.0)
FIO2: 40
MECHVT: 400 mL
Mechanical Rate: 16
O2 Saturation: 96.2 %
PEEP: 8 cmH2O
Patient temperature: 37
pCO2 arterial: 44 mmHg (ref 32–48)
pH, Arterial: 7.22 — ABNORMAL LOW (ref 7.35–7.45)
pO2, Arterial: 99 mmHg (ref 83–108)

## 2021-11-30 LAB — GLUCOSE, CAPILLARY
Glucose-Capillary: 121 mg/dL — ABNORMAL HIGH (ref 70–99)
Glucose-Capillary: 126 mg/dL — ABNORMAL HIGH (ref 70–99)
Glucose-Capillary: 66 mg/dL — ABNORMAL LOW (ref 70–99)
Glucose-Capillary: 75 mg/dL (ref 70–99)
Glucose-Capillary: 75 mg/dL (ref 70–99)
Glucose-Capillary: 76 mg/dL (ref 70–99)
Glucose-Capillary: 77 mg/dL (ref 70–99)
Glucose-Capillary: 92 mg/dL (ref 70–99)
Glucose-Capillary: 94 mg/dL (ref 70–99)
Glucose-Capillary: 96 mg/dL (ref 70–99)

## 2021-11-30 LAB — ANCA PROFILE
Anti-MPO Antibodies: <0.2 units (ref 0.0–0.9)
Anti-PR3 Antibodies: <0.2 units (ref 0.0–0.9)
Atypical P-ANCA titer: <1:20 {titer}
C-ANCA: <1:20 {titer}
P-ANCA: <1:20 {titer}

## 2021-11-30 LAB — COMPREHENSIVE METABOLIC PANEL
ALT: 5 U/L (ref 0–44)
AST: 10 U/L — ABNORMAL LOW (ref 15–41)
Albumin: 2.7 g/dL — ABNORMAL LOW (ref 3.5–5.0)
Alkaline Phosphatase: 129 U/L — ABNORMAL HIGH (ref 38–126)
Anion gap: 20 — ABNORMAL HIGH (ref 5–15)
BUN: 56 mg/dL — ABNORMAL HIGH (ref 6–20)
CO2: 20 mmol/L — ABNORMAL LOW (ref 22–32)
Calcium: 8.1 mg/dL — ABNORMAL LOW (ref 8.9–10.3)
Chloride: 95 mmol/L — ABNORMAL LOW (ref 98–111)
Creatinine, Ser: 10.89 mg/dL — ABNORMAL HIGH (ref 0.44–1.00)
GFR, Estimated: 4 mL/min — ABNORMAL LOW (ref >60)
Glucose, Bld: 128 mg/dL — ABNORMAL HIGH (ref 70–99)
Potassium: 3.6 mmol/L (ref 3.5–5.1)
Sodium: 135 mmol/L (ref 135–145)
Total Bilirubin: 0.6 mg/dL (ref 0.3–1.2)
Total Protein: 6.8 g/dL (ref 6.5–8.1)

## 2021-11-30 LAB — CBC
HCT: 28.9 % — ABNORMAL LOW (ref 36.0–46.0)
Hemoglobin: 8.8 g/dL — ABNORMAL LOW (ref 12.0–15.0)
MCH: 31 pg (ref 26.0–34.0)
MCHC: 30.4 g/dL (ref 30.0–36.0)
MCV: 101.8 fL — ABNORMAL HIGH (ref 80.0–100.0)
Platelets: 267 10*3/uL (ref 150–400)
RBC: 2.84 MIL/uL — ABNORMAL LOW (ref 3.87–5.11)
RDW: 18.2 % — ABNORMAL HIGH (ref 11.5–15.5)
WBC: 30.5 10*3/uL — ABNORMAL HIGH (ref 4.0–10.5)
nRBC: 1 % — ABNORMAL HIGH (ref 0.0–0.2)

## 2021-11-30 LAB — TRIGLYCERIDES: Triglycerides: 125 mg/dL (ref <150)

## 2021-11-30 LAB — CARDIOLIPIN ANTIBODIES, IGG, IGM, IGA
Anticardiolipin IgA: <9 APL U/mL (ref 0–11)
Anticardiolipin IgG: <9 GPL U/mL (ref 0–14)
Anticardiolipin IgM: 11 MPL U/mL (ref 0–12)

## 2021-11-30 LAB — PROTEIN C, TOTAL: Protein C, Total: 52 % — ABNORMAL LOW (ref 60–150)

## 2021-11-30 LAB — PHOSPHORUS: Phosphorus: 10.8 mg/dL — ABNORMAL HIGH (ref 2.5–4.6)

## 2021-11-30 LAB — HEPARIN LEVEL (UNFRACTIONATED)
Heparin Unfractionated: 0.51 IU/mL (ref 0.30–0.70)
Heparin Unfractionated: 0.32 IU/mL (ref 0.30–0.70)
Heparin Unfractionated: 0.26 IU/mL — ABNORMAL LOW (ref 0.30–0.70)

## 2021-11-30 LAB — PROTEIN S ACTIVITY: Protein S Activity: 77 % (ref 63–140)

## 2021-11-30 LAB — MAGNESIUM: Magnesium: 2 mg/dL (ref 1.7–2.4)

## 2021-11-30 LAB — BETA-2-GLYCOPROTEIN I ABS, IGG/M/A
Beta-2 Glyco I IgG: <9 GPI IgG units (ref 0–20)
Beta-2-Glycoprotein I IgA: <9 GPI IgA units (ref 0–25)
Beta-2-Glycoprotein I IgM: <9 GPI IgM units (ref 0–32)

## 2021-11-30 LAB — D-DIMER, QUANTITATIVE: D-Dimer, Quant: 2.94 ug/mL-FEU — ABNORMAL HIGH (ref 0.00–0.50)

## 2021-11-30 LAB — VANCOMYCIN, RANDOM: Vancomycin Rm: 18

## 2021-11-30 LAB — PROTEIN S, TOTAL: Protein S Ag, Total: 94 % (ref 60–150)

## 2021-11-30 LAB — C-REACTIVE PROTEIN: CRP: 16.2 mg/dL — ABNORMAL HIGH (ref <1.0)

## 2021-11-30 LAB — FERRITIN: Ferritin: 833 ng/mL — ABNORMAL HIGH (ref 11–307)

## 2021-11-30 LAB — PROTEIN C ACTIVITY: Protein C Activity: 64 % — ABNORMAL LOW (ref 73–180)

## 2021-11-30 IMAGING — DX DG CHEST 1V PORT
1 series · 1 of 1 positions shown · non-contrast
Comparison: Chest radiograph from one day prior.

CLINICAL DATA: Acute respiratory failure with hypoxia

EXAM:
PORTABLE CHEST 1 VIEW

[chest ap]
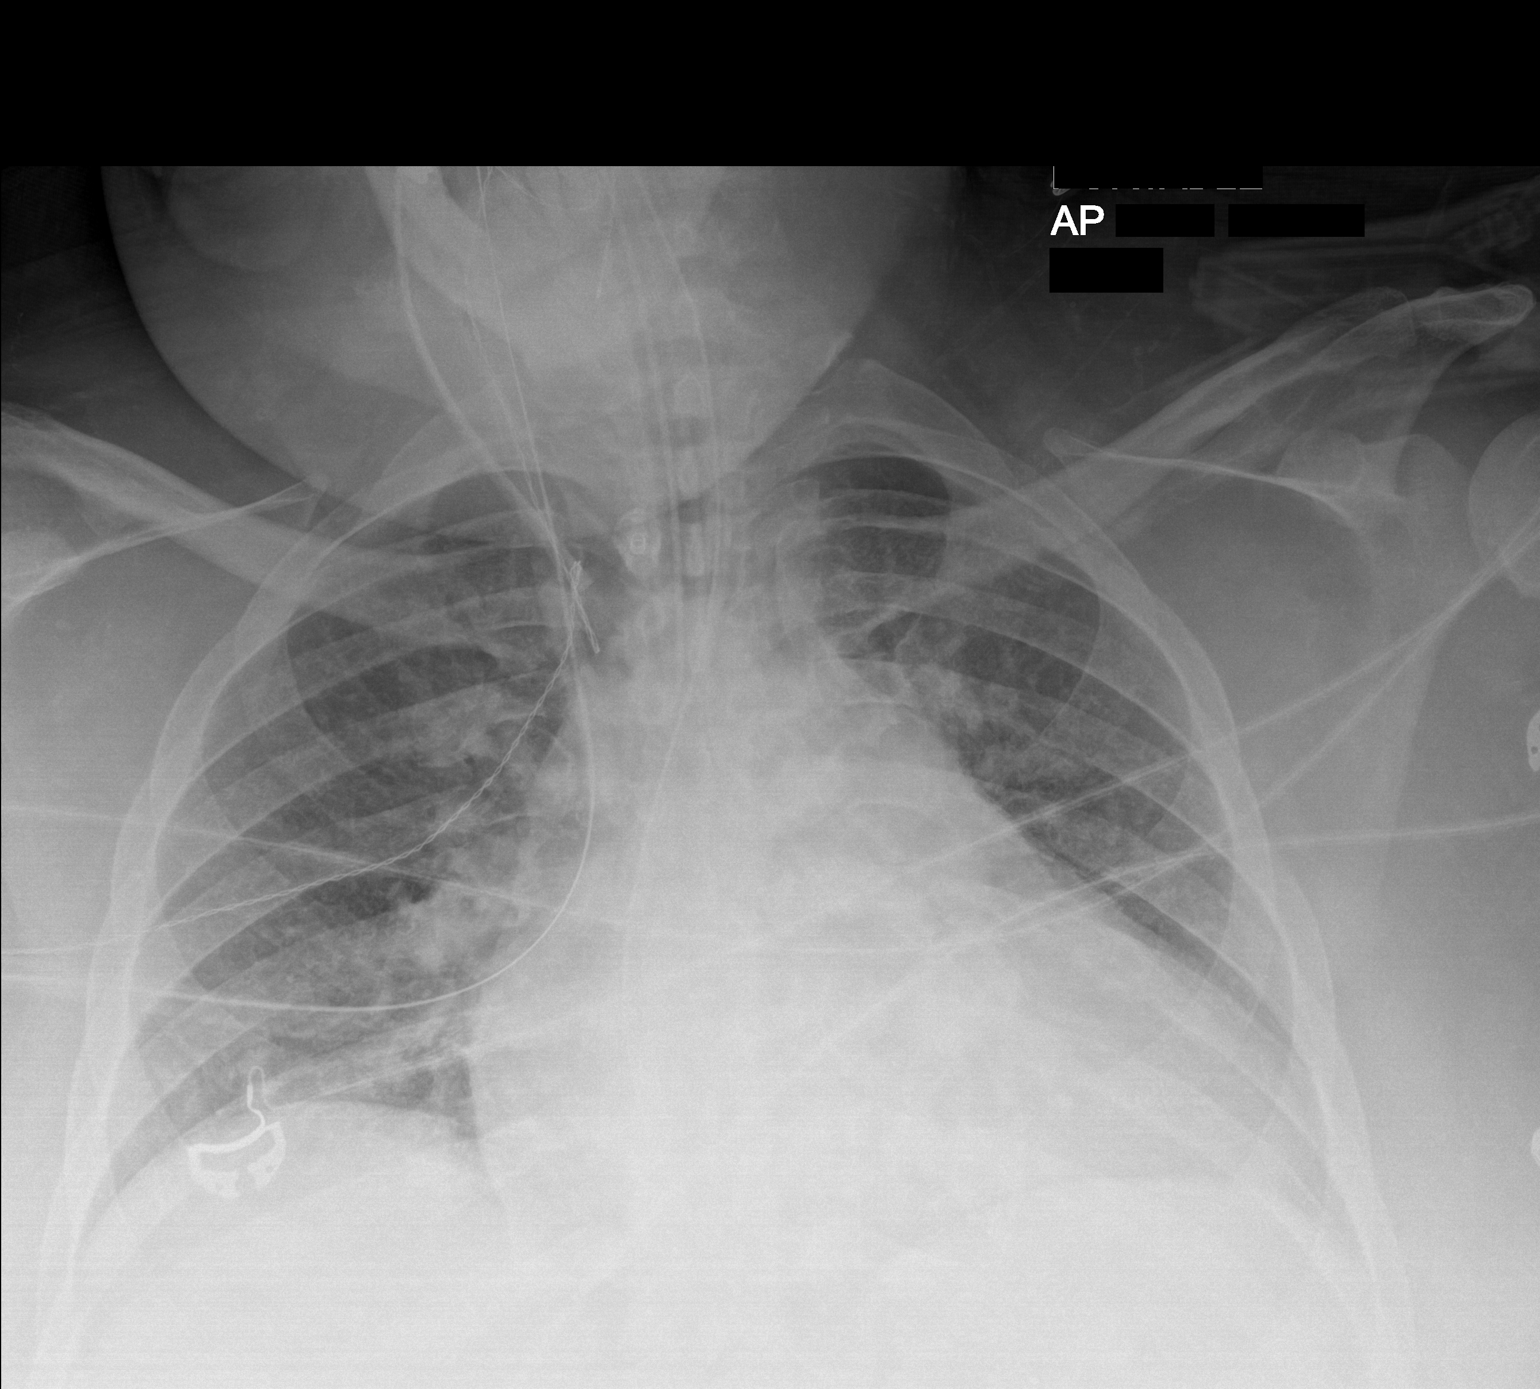

[1 of 1 positions shown; findings below may reference images not displayed]

FINDINGS: Endotracheal tube tip is 2.0 cm above the carina. Enteric tube
enters stomach with the tip not seen on this image. Stable
cardiomediastinal silhouette with mild to moderate cardiomegaly. No
pneumothorax. No pleural effusion. Low lung volumes. Mild pulmonary
edema, improved.
IMPRESSION: 1. Well-positioned support structures.
2. Mild congestive heart failure, improved.
3. Low lung volumes.

## 2021-11-30 MED ORDER — FREE WATER
30.0000 mL | Status: DC
  Administered 2021-11-30 – 2021-12-01 (×9): 30 mL

## 2021-11-30 MED ORDER — ASCORBIC ACID 500 MG PO TABS
500.0000 mg | ORAL_TABLET | Freq: Two times a day (BID) | ORAL | Status: DC
  Administered 2021-11-30 – 2021-12-03 (×5): 500 mg
  Filled 2021-11-30 (×5): qty 1

## 2021-11-30 MED ORDER — SODIUM BICARBONATE 8.4 % IV SOLN
INTRAVENOUS | Status: DC
  Filled 2021-11-30 (×3): qty 150
  Filled 2021-11-30 (×3): qty 1000

## 2021-11-30 MED ORDER — PROSOURCE TF PO LIQD
45.0000 mL | Freq: Three times a day (TID) | ORAL | Status: DC
  Administered 2021-12-01 – 2021-12-03 (×7): 45 mL
  Filled 2021-11-30 (×12): qty 45

## 2021-11-30 MED ORDER — NOREPINEPHRINE 16 MG/250ML-% IV SOLN
0.0000 ug/min | INTRAVENOUS | Status: DC
  Administered 2021-11-30: 2 ug/min via INTRAVENOUS
  Administered 2021-12-01: 18 ug/min via INTRAVENOUS
  Administered 2021-12-01: 26 ug/min via INTRAVENOUS
  Administered 2021-12-01: 30 ug/min via INTRAVENOUS
  Administered 2021-12-02: 16 ug/min via INTRAVENOUS
  Administered 2021-12-03: 11 ug/min via INTRAVENOUS
  Administered 2021-12-03: 30 ug/min via INTRAVENOUS
  Administered 2021-12-04: 36 ug/min via INTRAVENOUS
  Administered 2021-12-04: 39 ug/min via INTRAVENOUS
  Filled 2021-11-30 (×10): qty 250

## 2021-11-30 MED ORDER — METRONIDAZOLE 500 MG/100ML IV SOLN
500.0000 mg | Freq: Two times a day (BID) | INTRAVENOUS | Status: DC
  Administered 2021-11-30 – 2021-12-03 (×6): 500 mg via INTRAVENOUS
  Filled 2021-11-30 (×8): qty 100

## 2021-11-30 MED ORDER — VANCOMYCIN HCL 2000 MG/400ML IV SOLN
2000.0000 mg | INTRAVENOUS | Status: DC
  Administered 2021-11-30: 2000 mg via INTRAVENOUS
  Filled 2021-11-30: qty 400

## 2021-11-30 MED ORDER — FAMOTIDINE IN NACL 20-0.9 MG/50ML-% IV SOLN: 20.0000 mg | INTRAVENOUS | Status: DC

## 2021-11-30 MED ORDER — VITAL AF 1.2 CAL PO LIQD
1000.0000 mL | ORAL | Status: DC
  Administered 2021-11-30 – 2021-12-03 (×4): 1000 mL

## 2021-11-30 MED ORDER — RENA-VITE PO TABS
1.0000 | ORAL_TABLET | Freq: Every day | ORAL | Status: DC
  Administered 2021-12-02 – 2021-12-03 (×2): 1
  Filled 2021-11-30 (×2): qty 1

## 2021-11-30 NOTE — Consult Note (Signed)
[place] for heparin Indication:  Thrombus  Allergies  Allergen Reactions   Percocet [Oxycodone-Acetaminophen] Itching   Tomato Itching   Gabapentin Other (See Comments)    Extremity tremors    Patient Measurements: Height: 5\' 6"  (167.6 cm) Weight: 131.7 kg (290 lb 5.5 oz) IBW/kg (Calculated) : 59.3 Heparin Dosing Weight: 92.3 Kg  Vital Signs: Temp: 97.9 F (36.6 C) (02/14 0715) Temp Source: Esophageal (02/14 0400) BP: 79/65 (02/14 0435) Pulse Rate: 83 (02/14 0715)  Labs: Recent Labs    11/28/21 0602 11/28/21 1500 11/28/21 2356 11/29/21 0342 11/29/21 0916 11/30/21 0314 11/30/21 0430  HGB 8.6*  --   --  8.5*  --   --  8.8*  HCT 27.4*  --   --  27.5*  --   --  28.9*  PLT 253  --   --  274  --   --  267  HEPARINUNFRC 0.44   < > 0.36  --  0.40 0.51  --   CREATININE 11.50*  --   --  11.44*  --  10.89*  --    < > = values in this interval not displayed.     Estimated Creatinine Clearance: 10 mL/min (A) (by C-G formula based on SCr of 10.89 mg/dL (H)).   Medical History: Past Medical History:  Diagnosis Date   Anemia of chronic disease    Asthma    well controlled-no inhaler   Calciphylaxis 2020   CHF (congestive heart failure) ([place])    CKD (chronic kidney disease), stage III ([place])    Diabetes ([place])    type 2-diet controlled   Dyspnea    Hypertension    Morbid obesity ([place])    Nephrotic syndrome    PAD (peripheral artery disease) ([place])    Sepsis ([place]) 06/2021    Medications:  No chronic anticoagulation PTA Heparin Dosing Weight: 92.3 Kg  Assessment: Pharmacy has been consulted to initiate and monitor heparin in 36yo patient admitted with stroke-like symptoms. Her symptoms began to resolve, but neurologist noted concern for lower extremity ischemia, likely embolic.   Goal of Therapy:  Heparin level 0.3-0.5 units/ml (Per Stroke Protocol) Monitor platelets by anticoagulation protocol: Yes  Date Time  HL  Comment/rate 2/10 2222 HL < 0.10, subtherapeutic 2/11 0906 HL 0.12, subtherapeutic 2/11 2048 HL 0.22, subtherapeutic  2/12 0602 HL 0.44, therapeutic x 1 2/12 1500 HL 0.29, subtherapeutic; 2100>2200 un/hr 2/12 2356 HL 0.36, therapeutic x 1; 2200 un/hr 2/13 0916 HL 0.40, therapeutic x2; 2200 un/hr 2/14 0314 HL 0.51: 2200 >> 2100 units/hr 2/14 1339 HL 0.32: 2100 units/hr   Plan:  Continue heparin infusion at 2100 units/hr Recheck heparin level in 8 hours to confirm   Continue to monitor H&H and platelets daily while on heparin infusion  Vallery Sa, PharmD, BCPS Clinical Pharmacist 11/30/2021 8:49 AM

## 2021-11-30 NOTE — Consult Note (Addendum)
NAME: [name]  DOB: 31-Oct-1985  MRN: [id number]  Date/Time: 11/30/2021 2:41 PM  REQUESTING PROVIDER: Dr.[email or web address] Subjective:  REASON FOR CONSULT: culture neg endocarditis ?No history available from patient.  Chart reviewed. [name] is a 36 y.o. female with a history of end-stage renal disease on peritoneal dialysis, hypertension, calciphylaxis, peripheral arterial disease, left TMA Presented to the ED on 11/30/2021 with slurred speech acutely on that day.  She also was having trouble moving her left side.  In the ED vitals showed BP 85/74, pulse 88, respiratory 21 and sats 88% Labs revealed WBC of 17.2, Hb 9.2, platelet 247.  SARS-CoV-2 test was positive.  CT head was done and neurology was consulted who ordered an urgent MRI which showed multiple Subcentimeter acute cortically-based infarcts within the lateral right frontal lobe and posterior left frontal lobe. Patchy small acute infarcts within the callosal splenium on the right. Additional small recent infarcts (measuring up to 12 mm) are questioned within the left frontoparietal white matter. Given small infarcts involving multiple vascular territories, there is suspicion for an embolic process.  She did not have a pulse on the right foot and there was some toe ischemia.  She was worked up for coagulation disorder including antiphospholipid profile, homocystine, protein C and protein S.  The homocystine levels were elevated antiphospholipid screen was negative, ANCA negative.  A 2D echo was done which showed heavily calcified mitral valve with thickening.  Endocarditis was questioned and they wanted to get TEE.  Started on IV heparin.  Patient was admitted to the ICU and anchor speech focus on 11/29/2021 she became agitated and tachypneic and had to be intubated because of Pittore failure and unresponsiveness.  Patient has been on vancomycin and cefepime since admission.  I am also asked to see the patient for the same.   Past Medical  History:  Diagnosis Date   Anemia of chronic disease    Asthma    well controlled-no inhaler   Calciphylaxis 2020   CHF (congestive heart failure) ([place])    CKD (chronic kidney disease), stage III ([place])    Diabetes ([place])    type 2-diet controlled   Dyspnea    Hypertension    Morbid obesity ([place])    Nephrotic syndrome    PAD (peripheral artery disease) ([place])    Sepsis ([place]) 06/2021    Past Surgical History:  Procedure Laterality Date   A/V FISTULAGRAM Left 10/29/2020   Procedure: A/V FISTULAGRAM;  Surgeon: Algernon Huxley, MD;  Location: [place] [facility];  Service: Cardiovascular;  Laterality: Left;   APPLICATION OF WOUND VAC Bilateral 09/15/2019   Procedure: APPLICATION OF WOUND VAC;  Surgeon: Benjamine Sprague, DO;  Location: [facility];  Service: General;  Laterality: Bilateral;   AV FISTULA PLACEMENT Left 07/09/2020   Procedure: ARTERIOVENOUS (AV) FISTULA CREATION ( BRACHIAL CEPHALIC);  Surgeon: Algernon Huxley, MD;  Location: [facility];  Service: Vascular;  Laterality: Left;   DIALYSIS/PERMA CATHETER INSERTION N/A 02/28/2019   Procedure: DIALYSIS/PERMA CATHETER INSERTION;  Surgeon: Algernon Huxley, MD;  Location: [place] [facility];  Service: Cardiovascular;  Laterality: N/A;   DIALYSIS/PERMA CATHETER INSERTION N/A 09/17/2019   Procedure: DIALYSIS/PERMA CATHETER EXCHANGE;  Surgeon: Katha Cabal, MD;  Location: [place] [facility];  Service: Cardiovascular;  Laterality: N/A;   DIALYSIS/PERMA CATHETER REMOVAL N/A 03/18/2020   Procedure: DIALYSIS/PERMA CATHETER REMOVAL;  Surgeon: Algernon Huxley, MD;  Location: [place] [facility];  Service: Cardiovascular;  Laterality: N/A;   LIGATION OF  ARTERIOVENOUS  FISTULA Left 06/03/2021   Procedure: LIGATION OF ARTERIOVENOUS  FISTULA;  Surgeon: Algernon Huxley, MD;  Location: [facility];  Service: Vascular;  Laterality: Left;   LOWER EXTREMITY ANGIOGRAPHY Left 12/02/2020   Procedure: LOWER EXTREMITY ANGIOGRAPHY;  Surgeon: Algernon Huxley, MD;  Location: [place] [facility];  Service: Cardiovascular;  Laterality: Left;   LOWER EXTREMITY ANGIOGRAPHY Right 01/25/2021   Procedure: LOWER EXTREMITY ANGIOGRAPHY;  Surgeon: Algernon Huxley, MD;  Location: [place] [facility];  Service: Cardiovascular;  Laterality: Right;   LOWER EXTREMITY ANGIOGRAPHY Left 04/15/2021   Procedure: LOWER EXTREMITY ANGIOGRAPHY;  Surgeon: Algernon Huxley, MD;  Location: [place] [facility];  Service: Cardiovascular;  Laterality: Left;   LOWER EXTREMITY ANGIOGRAPHY Right 05/20/2021   Procedure: LOWER EXTREMITY ANGIOGRAPHY;  Surgeon: Algernon Huxley, MD;  Location: [place] [facility];  Service: Cardiovascular;  Laterality: Right;   LOWER EXTREMITY ANGIOGRAPHY Left 05/27/2021   Procedure: LOWER EXTREMITY ANGIOGRAPHY;  Surgeon: Algernon Huxley, MD;  Location: [place] [facility];  Service: Cardiovascular;  Laterality: Left;   LOWER EXTREMITY ANGIOGRAPHY Right 08/04/2021   Procedure: LOWER EXTREMITY ANGIOGRAPHY;  Surgeon: Algernon Huxley, MD;  Location: [place] [facility];  Service: Cardiovascular;  Laterality: Right;   LOWER EXTREMITY ANGIOGRAPHY Left 11/15/2021   Procedure: LOWER EXTREMITY ANGIOGRAPHY;  Surgeon: Algernon Huxley, MD;  Location: [place] [facility];  Service: Cardiovascular;  Laterality: Left;   pd cath  05/21/2019   TENDON LENGTHENING Left 09/08/2021   Procedure: TENDON ACHILLES LENGTHENING;  Surgeon: Criselda Peaches, DPM;  Location: [facility];  Service: Podiatry;  Laterality: Left;   TONSILLECTOMY     TRANSMETATARSAL AMPUTATION Left 09/08/2021   Procedure: TRANSMETATARSAL AMPUTATION;  Surgeon: Criselda Peaches, DPM;  Location: [facility];  Service: Podiatry;  Laterality: Left;   UPPER EXTREMITY ANGIOGRAPHY Left 04/15/2021   Procedure: UPPER EXTREMITY ANGIOGRAPHY;  Surgeon: Algernon Huxley, MD;  Location: [place] [facility];  Service: Cardiovascular;  Laterality: Left;   WOUND DEBRIDEMENT Bilateral 09/15/2019   Procedure: DEBRIDEMENT WOUND;  Surgeon: Benjamine Sprague, DO;  Location: [facility];  Service: General;  Laterality: Bilateral;    Social History   Socioeconomic History   Marital status: Single    Spouse name: Not on file   Number of children: 0   Years of education: Not on file   Highest education level: Not on file  Occupational History   Occupation: works at [employer] 6   Tobacco Use   Smoking status: Former    Types: Cigars   Smokeless tobacco: Never  Scientific laboratory technician Use: Never used  Substance and Sexual Activity   Alcohol use: No    Alcohol/week: 0.0 standard drinks   Drug use: No   Sexual activity: Yes    Birth control/protection: None  Other Topics Concern   Not on file  Social History Narrative   Lives with mother    Social Determinants of Health   Financial Resource Strain: Not on file  Food Insecurity: Not on file  Transportation Needs: Not on file  Physical Activity: Not on file  Stress: Not on file  Social Connections: Not on file  Intimate Partner Violence: Not on file    Family History  Problem Relation Age of Onset   Hypertension Mother    Multiple sclerosis Mother    CAD Mother    Cancer Maternal Grandmother        Ovarian    Cancer Maternal  Grandfather        Prostate    Kidney disease Father    Allergies  Allergen Reactions   Percocet [Oxycodone-Acetaminophen] Itching   Tomato Itching   Gabapentin Other (See Comments)    Extremity tremors   I? Current Facility-Administered Medications  Medication Dose Route Frequency Provider Last Rate Last Admin   0.9 %  sodium chloride infusion   Intravenous PRN Flora Lipps, MD       acetaminophen (TYLENOL) tablet 650 mg  650 mg Oral Q4H PRN Rise Patience, MD       Or   acetaminophen (TYLENOL) 160 MG/5ML solution 650 mg  650 mg Per Tube Q4H PRN Rise Patience, MD   650 mg at 11/27/21 0123   Or   acetaminophen (TYLENOL) suppository 650 mg  650 mg Rectal Q4H PRN Rise Patience, MD   650 mg at 11/27/21 6644   ascorbic acid (VITAMIN C) tablet 500 mg  500 mg  Per Tube BID Flora Lipps, MD       aspirin suppository 300 mg  300 mg Rectal Daily Rise Patience, MD   300 mg at 11/30/21 1102   Or   aspirin tablet 325 mg  325 mg Oral Daily Rise Patience, MD   325 mg at 11/17/2021 1804   ceFEPIme (MAXIPIME) 1 g in sodium chloride 0.9 % 100 mL IVPB  1 g Intravenous Daily Darnelle Bos, [facility]   Stopped at 11/30/21 1119   chlorhexidine gluconate (MEDLINE KIT) (PERIDEX) 0.12 % solution 15 mL  15 mL Mouth Rinse BID Flora Lipps, MD   15 mL at 11/30/21 0837   Chlorhexidine Gluconate Cloth 2 % PADS 6 each  6 each Topical Daily Rise Patience, MD   6 each at 11/30/21 0523   collagenase (SANTYL) ointment   Topical Daily Rise Patience, MD   1 application at 03/47/42 5956   dialysis solution 1.5% low-MG/low-CA dianeal solution   Intraperitoneal Q24H Kolluru, Lurena Nida, MD   New Bag at 11/28/21 1912   docusate (COLACE) 50 MG/5ML liquid 100 mg  100 mg Per Tube BID Darel Hong D, NP   100 mg at 11/30/21 0839   [START ON 12/05/2021] famotidine (PEPCID) IVPB 20 mg premix  20 mg Intravenous Q24H Dallie Piles, [facility]       [START ON 12/07/2021] feeding supplement (PROSource TF) liquid 45 mL  45 mL Per Tube TID Flora Lipps, MD       feeding supplement (VITAL AF 1.2 CAL) liquid 1,000 mL  1,000 mL Per Tube Q24H Flora Lipps, MD       fentaNYL (SUBLIMAZE) bolus via infusion 50-100 mcg  50-100 mcg Intravenous Q15 min PRN Darel Hong D, NP   50 mcg at 11/30/21 0624   fentaNYL (SUBLIMAZE) injection 12.5 mcg  12.5 mcg Intravenous Q2H PRN Edwin Dada, MD   12.5 mcg at 11/29/21 0905   fentaNYL 2529mg in NS 2559m(1056mml) infusion-PREMIX  50-200 mcg/hr Intravenous Continuous KeeDarel Hong NP 15 mL/hr at 11/30/21 1231 150 mcg/hr at 11/30/21 1231   free water 30 mL  30 mL Per Tube Q4H KasFlora LippsD       gentamicin cream (GARAMYCIN) 0.1 % 1 application  1 application Topical Daily Kolluru, Sarath, MD   1 application at 02/38/75/6436   heparin  ADULT infusion 100 units/mL (25000 units/250m66m2,100 Units/hr Intravenous Continuous KasaFlora Lipps 21 mL/hr at 11/30/21 1231 2,100 Units/hr at 11/30/21 1231  hydrALAZINE (APRESOLINE) injection 5 mg  5 mg Intravenous Q4H PRN Rise Patience, MD       MEDLINE mouth rinse  15 mL Mouth Rinse 10 times per day Flora Lipps, MD   15 mL at 11/30/21 1300   metroNIDAZOLE (FLAGYL) IVPB 500 mg  500 mg Intravenous Q12H Flora Lipps, MD 100 mL/hr at 11/30/21 1231 Infusion Verify at 11/30/21 1231   midazolam (VERSED) 100 mg/100 mL (1 mg/mL) premix infusion  0.5-10 mg/hr Intravenous Continuous Flora Lipps, MD 1 mL/hr at 11/30/21 1231 1 mg/hr at 11/30/21 1231   [START ON 12/14/2021] multivitamin (RENA-VIT) tablet 1 tablet  1 tablet Per Tube QHS Flora Lipps, MD       norepinephrine (LEVOPHED) 16 mg in 226m premix infusion  0-40 mcg/min Intravenous Titrated Rust-Chester, Britton L, NP 9.38 mL/hr at 11/30/21 1231 10 mcg/min at 11/30/21 1231   pantoprazole (PROTONIX) injection 40 mg  40 mg Intravenous Q24H Rust-Chester, Britton L, NP   40 mg at 11/30/21 0311   polyethylene glycol (MIRALAX / GLYCOLAX) packet 17 g  17 g Per Tube Daily KDarel HongD, NP   17 g at 11/30/21 0840   sodium bicarbonate 150 mEq in dextrose 5 % 1,150 mL infusion   Intravenous Continuous SMurlean Iba MD 75 mL/hr at 11/30/21 1231 Infusion Verify at 11/30/21 1231   vancomycin (VANCOREADY) IVPB 2000 mg/400 mL  2,000 mg Intravenous Q72H GDallie Piles RPH         Abtx:  Anti-infectives (From admission, onward)    Start     Dose/Rate Route Frequency Ordered Stop   11/30/21 1600  vancomycin (VANCOREADY) IVPB 2000 mg/400 mL        2,000 mg 200 mL/hr over 120 Minutes Intravenous every 72 hours 11/30/21 1423     11/30/21 1200  metroNIDAZOLE (FLAGYL) IVPB 500 mg        500 mg 100 mL/hr over 60 Minutes Intravenous Every 12 hours 11/30/21 1104 12/07/21 1159   11/27/21 1100  vancomycin (VANCOREADY) IVPB 2000 mg/400 mL        2,000  mg 200 mL/hr over 120 Minutes Intravenous  Once 11/27/21 1008 11/27/21 1319   11/27/21 1000  remdesivir 100 mg in sodium chloride 0.9 % 100 mL IVPB       See Hyperspace for full Linked Orders Report.   100 mg 200 mL/hr over 30 Minutes Intravenous Daily 11/19/2021 1750 11/30/21 1210   12/10/2021 1815  ceFEPIme (MAXIPIME) 1 g in sodium chloride 0.9 % 100 mL IVPB        1 g 200 mL/hr over 30 Minutes Intravenous Daily 11/30/2021 1814     11/23/2021 1800  remdesivir 200 mg in sodium chloride 0.9% 250 mL IVPB       See Hyperspace for full Linked Orders Report.   200 mg 580 mL/hr over 30 Minutes Intravenous Once 11/22/2021 1750 11/25/2021 1916       REVIEW OF SYSTEMS:  na Objective:  VITALS:  BP (!) 67/20 (BP Location: Right Wrist)    Pulse 88    Temp 98.8 F (37.1 C)    Resp 11    Ht 5' 6"  (1.676 m)    Wt 131.7 kg    SpO2 99%    BMI 46.86 kg/m  PHYSICAL EXAM:  General: Intubated and sedated Head: Bony swelling on the forehead on the right side Eyes: Conjunctivae clear, anicteric sclerae. Pupils are equal ENT cannot be assessed.   Neck:, symmetrical, no adenopathy, thyroid: non tender no  carotid bruit and no JVD. Back lungs: biLateral air entry.Marland Kitchen Heart: Tachycardia Abdomen: Soft, PD catheter in place   extremities: Right toes ischemic Left TMA site has some superficial necrosis with wound at the stump      skin: Scarring of  both calves Lymph: Cervical, supraclavicular normal. Neurologic: Grossly non-focal Right CVC triple-lumen Left arterial line Pertinent Labs Lab Results CBC    Component Value Date/Time   WBC 30.5 (H) 11/30/2021 0430   RBC 2.84 (L) 11/30/2021 0430   HGB 8.8 (L) 11/30/2021 0430   HGB 12.5 12/31/2013 1020   HCT 28.9 (L) 11/30/2021 0430   HCT 38.5 12/31/2013 1020   PLT 267 11/30/2021 0430   PLT 239 12/31/2013 1020   MCV 101.8 (H) 11/30/2021 0430   MCV 84 12/31/2013 1020   MCH 31.0 11/30/2021 0430   MCHC 30.4 11/30/2021 0430   RDW 18.2 (H) 11/30/2021 0430    RDW 15.2 (H) 12/31/2013 1020   LYMPHSABS 1.8 11/27/2021 0528   MONOABS 0.5 11/27/2021 0528   EOSABS 0.2 11/27/2021 0528   BASOSABS 0.0 11/27/2021 0528    CMP Latest Ref Rng & Units 11/30/2021 11/29/2021 11/28/2021  Glucose 70 - 99 mg/dL 128(H) 152(H) 108(H)  BUN 6 - 20 mg/dL 56(H) 64(H) 67(H)  Creatinine 0.44 - 1.00 mg/dL 10.89(H) 11.44(H) 11.50(H)  Sodium 135 - 145 mmol/L 135 134(L) 136  Potassium 3.5 - 5.1 mmol/L 3.6 3.5 4.0  Chloride 98 - 111 mmol/L 95(L) 93(L) 93(L)  CO2 22 - 32 mmol/L 20(L) 15(L) 18(L)  Calcium 8.9 - 10.3 mg/dL 8.1(L) 7.8(L) 7.9(L)  Total Protein 6.5 - 8.1 g/dL 6.8 6.6 6.5  Total Bilirubin 0.3 - 1.2 mg/dL 0.6 1.0 1.0  Alkaline Phos 38 - 126 U/L 129(H) 132(H) 120  AST 15 - 41 U/L 10(L) 12(L) 11(L)  ALT 0 - 44 U/L 5 <5 6      Microbiology: Recent Results (from the past 240 hour(s))  SARS CORONAVIRUS 2 (TAT 6-24 HRS) Nasopharyngeal Nasopharyngeal Swab     Status: None   Collection Time: 11/24/21  9:56 AM   Specimen: Nasopharyngeal Swab  Result Value Ref Range Status   SARS Coronavirus 2 NEGATIVE NEGATIVE Final    Comment: (NOTE) SARS-CoV-2 target nucleic acids are NOT DETECTED.  The SARS-CoV-2 RNA is generally detectable in upper and lower respiratory specimens during the acute phase of infection. Negative results do not preclude SARS-CoV-2 infection, do not rule out co-infections with other pathogens, and should not be used as the sole basis for treatment or other patient management decisions. Negative results must be combined with clinical observations, patient history, and epidemiological information. The expected result is Negative.  Fact Sheet for Patients: SugarRoll.be  Fact Sheet for Healthcare Providers: https://www.woods-mathews.com/  This test is not yet approved or cleared by the [place] FDA and  has been authorized for detection and/or diagnosis of SARS-CoV-2 by FDA under an Emergency Use  Authorization (EUA). This EUA will remain  in effect (meaning this test can be used) for the duration of the COVID-19 declaration under Se ction 564(b)(1) of the Act, 21 U.S.C. section 360bbb-3(b)(1), unless the authorization is terminated or revoked sooner.  Performed at [facility], [place] [address], [place], [place] [address]   Resp Panel by RT-PCR (Flu A&B, Covid) Nasopharyngeal Swab     Status: Abnormal   Collection Time: 12/02/2021  2:12 PM   Specimen: Nasopharyngeal Swab; Nasopharyngeal(NP) swabs in vial transport medium  Result Value Ref Range Status   SARS  Coronavirus 2 by RT PCR POSITIVE (A) NEGATIVE Final    Comment: (NOTE) SARS-CoV-2 target nucleic acids are DETECTED.  The SARS-CoV-2 RNA is generally detectable in upper respiratory specimens during the acute phase of infection. Positive results are indicative of the presence of the identified virus, but do not rule out bacterial infection or co-infection with other pathogens not detected by the test. Clinical correlation with patient history and other diagnostic information is necessary to determine patient infection status. The expected result is Negative.  Fact Sheet for Patients: EntrepreneurPulse.com.au  Fact Sheet for Healthcare Providers: IncredibleEmployment.be  This test is not yet approved or cleared by the [place] FDA and  has been authorized for detection and/or diagnosis of SARS-CoV-2 by FDA under an Emergency Use Authorization (EUA).  This EUA will remain in effect (meaning this test can be used) for the duration of  the COVID-19 declaration under Section 564(b)(1) of the A ct, 21 U.S.C. section 360bbb-3(b)(1), unless the authorization is terminated or revoked sooner.     Influenza A by PCR NEGATIVE NEGATIVE Final   Influenza B by PCR NEGATIVE NEGATIVE Final    Comment: (NOTE) The Xpert Xpress SARS-CoV-2/FLU/RSV plus assay is intended as an aid in the  diagnosis of influenza from Nasopharyngeal swab specimens and should not be used as a sole basis for treatment. Nasal washings and aspirates are unacceptable for Xpert Xpress SARS-CoV-2/FLU/RSV testing.  Fact Sheet for Patients: EntrepreneurPulse.com.au  Fact Sheet for Healthcare Providers: IncredibleEmployment.be  This test is not yet approved or cleared by the [place] FDA and has been authorized for detection and/or diagnosis of SARS-CoV-2 by FDA under an Emergency Use Authorization (EUA). This EUA will remain in effect (meaning this test can be used) for the duration of the COVID-19 declaration under Section 564(b)(1) of the Act, 21 U.S.C. section 360bbb-3(b)(1), unless the authorization is terminated or revoked.  Performed at [facility], [address] [place], [place] [id number]   CULTURE, BLOOD (ROUTINE X 2) w Reflex to ID Panel     Status: None (Preliminary result)   Collection Time: 11/27/21 10:32 AM   Specimen: BLOOD  Result Value Ref Range Status   Specimen Description BLOOD BLOOD RIGHT HAND  Final   Special Requests   Final    BOTTLES DRAWN AEROBIC AND ANAEROBIC Blood Culture adequate volume   Culture   Final    NO GROWTH 3 DAYS Performed at [facility], [address], [place], [place] [address]    Report Status PENDING  Incomplete  CULTURE, BLOOD (ROUTINE X 2) w Reflex to ID Panel     Status: None (Preliminary result)   Collection Time: 11/27/21 10:32 AM   Specimen: BLOOD  Result Value Ref Range Status   Specimen Description BLOOD BLOOD RIGHT ARM  Final   Special Requests   Final    BOTTLES DRAWN AEROBIC AND ANAEROBIC Blood Culture adequate volume   Culture   Final    NO GROWTH 3 DAYS Performed at [facility], [address], [place], [place] [address]    Report Status PENDING  Incomplete  MRSA Next Gen by PCR, Nasal     Status: None   Collection Time: 11/27/21  4:24 PM   Specimen:  Nasal Mucosa; Nasal Swab  Result Value Ref Range Status   MRSA by PCR Next Gen NOT DETECTED NOT DETECTED Final    Comment: (NOTE) The GeneXpert MRSA Assay (FDA approved for NASAL specimens only), is one component of a comprehensive MRSA  colonization surveillance program. It is not intended to diagnose MRSA infection nor to guide or monitor treatment for MRSA infections. Test performance is not FDA approved in patients less than 72 years old. Performed at [facility], [address], [place], [place] [address]   CULTURE, BLOOD (ROUTINE X 2) w Reflex to ID Panel     Status: None (Preliminary result)   Collection Time: 11/28/21 11:42 AM   Specimen: BLOOD  Result Value Ref Range Status   Specimen Description BLOOD BLOOD RIGHT HAND  Final   Special Requests   Final    BOTTLES DRAWN AEROBIC AND ANAEROBIC Blood Culture adequate volume   Culture   Final    NO GROWTH 2 DAYS Performed at [facility], [address] [place], [place] [address]    Report Status PENDING  Incomplete  CULTURE, BLOOD (ROUTINE X 2) w Reflex to ID Panel     Status: None (Preliminary result)   Collection Time: 11/28/21 11:42 AM   Specimen: BLOOD  Result Value Ref Range Status   Specimen Description BLOOD RIGHT ANTECUBITAL  Final   Special Requests   Final    AEROBIC BOTTLE ONLY Blood Culture results may not be optimal due to an inadequate volume of blood received in culture bottles   Culture   Final    NO GROWTH 2 DAYS Performed at [facility], [address], [place], [place] [address]    Report Status PENDING  Incomplete  Wet prep, genital     Status: Abnormal   Collection Time: 11/29/21  9:16 AM   Specimen: Vaginal  Result Value Ref Range Status   Yeast Wet Prep HPF POC NONE SEEN NONE SEEN Final   Trich, Wet Prep NONE SEEN NONE SEEN Final   Clue Cells Wet Prep HPF POC PRESENT (A) NONE SEEN Final   WBC, Wet Prep HPF POC >=10 (A) <10 Final   Sperm NONE SEEN  Final     Comment: Performed at [facility], [address] [place], [place] [address]    IMAGING RESULTS:  I have personally reviewed the films Mild congestive heart failure  Impression/Recommendation 36 year old female with history of hypertension, end-stage renal disease, calciphylaxis, peripheral arterial disease presenting with left-sided weakness.  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 TEE pending Clinically does not appear to be endocarditis Patient has been on peritoneal dialysis the whole time.  Now has a hemodialysis catheter on the right femoral..    History of bilateral ischemia feet leading to left TMA in November 2022 but the site is now necrotic Right toes ischemia  Patient is on vancomycin, cefepime and Flagyl.  Low threshold to stop cefepime and Flagyl( was added today for clue cells )  especially it can contribute to encephalopathy. This is considered to be due to peripheral artery disease  differential diagnosis could be thrombotic episode and clot/coagulation defect. And also autoimmune condition like antiphospholipid antibody syndrome She has been extensively investigated 3 times in the past 2 years and antiphospholipid, ANA, ANCA have been negative The biopsy of the calciphylaxis ulcer from the calf had in November 2020 was proven for calciphylaxis and no vasculitis  COVID-positive Incidental finding.  Doubt it is it is contributing clinically  Hypertension  Peripheral artery disease  Anemia of chronic disease  End-stage renal disease getting him peritoneal dialysis.  Question whether that is adequate in acute setting.  ? ___________________________________________________ Discussed with care team.  Note:  This document was prepared using Dragon  voice recognition software and may include unintentional dictation errors.

## 2021-11-30 NOTE — Progress Notes (Signed)
PD initiated. Site no infection. PD catheter intact. Cycler went into first fill without issue.

## 2021-11-30 NOTE — Consult Note (Signed)
[place] for heparin Indication:  Thrombus  Allergies  Allergen Reactions   Percocet [Oxycodone-Acetaminophen] Itching   Tomato Itching   Gabapentin Other (See Comments)    Extremity tremors    Patient Measurements: Height: 5\' 6"  (167.6 cm) Weight: 131.7 kg (290 lb 5.5 oz) IBW/kg (Calculated) : 59.3 Heparin Dosing Weight: 92.3 Kg  Vital Signs: Temp: 97.9 F (36.6 C) (02/14 2245) Temp Source: Esophageal (02/14 2000) Pulse Rate: 86 (02/14 2245)  Labs: Recent Labs    11/28/21 0602 11/28/21 1500 11/29/21 0342 11/29/21 0916 11/30/21 0314 11/30/21 0430 11/30/21 1339 11/30/21 2226  HGB 8.6*  --  8.5*  --   --  8.8*  --   --   HCT 27.4*  --  27.5*  --   --  28.9*  --   --   PLT 253  --  274  --   --  267  --   --   HEPARINUNFRC 0.44   < >  --    < > 0.51  --  0.32 0.26*  CREATININE 11.50*  --  11.44*  --  10.89*  --   --   --    < > = values in this interval not displayed.     Estimated Creatinine Clearance: 10 mL/min (A) (by C-G formula based on SCr of 10.89 mg/dL (H)).   Medical History: Past Medical History:  Diagnosis Date   Anemia of chronic disease    Asthma    well controlled-no inhaler   Calciphylaxis 2020   CHF (congestive heart failure) ([place])    CKD (chronic kidney disease), stage III ([place])    Diabetes ([place])    type 2-diet controlled   Dyspnea    Hypertension    Morbid obesity ([place])    Nephrotic syndrome    PAD (peripheral artery disease) ([place])    Sepsis ([place]) 06/2021    Medications:  No chronic anticoagulation PTA Heparin Dosing Weight: 92.3 Kg  Assessment: Pharmacy has been consulted to initiate and monitor heparin in 36yo patient admitted with stroke-like symptoms. Her symptoms began to resolve, but neurologist noted concern for lower extremity ischemia, likely embolic.   Goal of Therapy:  Heparin level 0.3-0.5 units/ml (Per Stroke Protocol) Monitor platelets by anticoagulation protocol:  Yes  Date Time HL  Comment/rate 2/10 2222 HL < 0.10, subtherapeutic 2/11 0906 HL 0.12, subtherapeutic 2/11 2048 HL 0.22, subtherapeutic  2/12 0602 HL 0.44, therapeutic x 1 2/12 1500 HL 0.29, subtherapeutic; 2100>2200 un/hr 2/12 2356 HL 0.36, therapeutic x 1; 2200 un/hr 2/13 0916 HL 0.40, therapeutic x2; 2200 un/hr 2/14 0314 HL 0.51: 2200 >> 2100 units/hr 2/14 1339 HL 0.32: 2100 units/hr 2/14 2226 HL 0.26: SUBtherapeutic ; 2100 units/hr   Plan:  2/14:  HL @ 2226 = 0.26, SUBtherapeutic  Will increase drip rate to 2200 units/hr and recheck HL 8 hrs after rate change.   Emersen Carroll D Clinical Pharmacist 11/30/2021 11:08 PM

## 2021-11-30 NOTE — Progress Notes (Addendum)
Neurology Progress Note   S:// No sig overnight events, remains intubated  O:// Current vital signs: BP (!) 79/65    Pulse 83    Temp 97.9 F (36.6 C)    Resp 13    Ht 5' 6"  (1.676 m)    Wt 131.7 kg    SpO2 99%    BMI 46.86 kg/m  Vital signs in last 24 hours: Temp:  [96.2 F (35.7 C)-98.6 F (37 C)] 97.9 F (36.6 C) (02/14 0715) Pulse Rate:  [79-96] 83 (02/14 0715) Resp:  [10-24] 13 (02/14 0715) BP: (79-96)/(56-65) 79/65 (02/14 0435) SpO2:  [94 %-100 %] 99 % (02/14 0831) Arterial Line BP: (80-149)/(42-92) 95/48 (02/14 0800) FiO2 (%):  [40 %-80 %] 40 % (02/14 0831) Weight:  [131.7 kg] 131.7 kg (02/14 0500)  Exam today without significant changes as compared to yesterday. General: Sick appearing obese woman, appears older than stated age, laying in bed, intubated. HEENT: Normocephalic, atraumatic CVS: Regular rhythm Abdomen: Obese without tenderness Extremities: Edematous, right calf ulcer looks healed, both feet bandaged. Respiratory: bilaterally equal excursions, no audible wheeze/cuff leak. Obtunded on sedation, non verbal. Mimics and follow some commands but remains nonverbal. Cranial nerves: Pupils equal round sluggishly reactive to light, visual fields appear full to confrontation, face grossly symmetric (ET tube in place). Motor withdraws to noxious pain in left upper/lower extremities and  right lower extremity. No withdrawal in right upper. DTRs mute Toes mute.  Medications  Current Facility-Administered Medications:    0.9 %  sodium chloride infusion, , Intravenous, PRN, Flora Lipps, MD   acetaminophen (TYLENOL) tablet 650 mg, 650 mg, Oral, Q4H PRN **OR** acetaminophen (TYLENOL) 160 MG/5ML solution 650 mg, 650 mg, Per Tube, Q4H PRN, 650 mg at 11/27/21 0123 **OR** acetaminophen (TYLENOL) suppository 650 mg, 650 mg, Rectal, Q4H PRN, Rise Patience, MD, 650 mg at 11/27/21 0827   aspirin suppository 300 mg, 300 mg, Rectal, Daily, 300 mg at 11/29/21 1059 **OR**  aspirin tablet 325 mg, 325 mg, Oral, Daily, Rise Patience, MD, 325 mg at 11/23/2021 1804   ceFEPIme (MAXIPIME) 1 g in sodium chloride 0.9 % 100 mL IVPB, 1 g, Intravenous, Daily, Darnelle Bos, RPH, Stopped at 11/29/21 1208   chlorhexidine gluconate (MEDLINE KIT) (PERIDEX) 0.12 % solution 15 mL, 15 mL, Mouth Rinse, BID, Kasa, Kurian, MD, 15 mL at 11/30/21 0837   Chlorhexidine Gluconate Cloth 2 % PADS 6 each, 6 each, Topical, Daily, Rise Patience, MD, 6 each at 11/30/21 0523   collagenase (SANTYL) ointment, , Topical, Daily, Rise Patience, MD, 1 application at 81/85/63 0838   dextrose 10 % infusion, , Intravenous, Continuous, Sharion Settler, NP, Last Rate: 25 mL/hr at 11/30/21 0700, Infusion Verify at 11/30/21 0700   dialysis solution 1.5% low-MG/low-CA dianeal solution, , Intraperitoneal, Q24H, Kolluru, Sarath, MD, New Bag at 11/28/21 1912   docusate (COLACE) 50 MG/5ML liquid 100 mg, 100 mg, Per Tube, BID, Darel Hong D, NP, 100 mg at 11/30/21 0839   famotidine (PEPCID) IVPB 20 mg premix, 20 mg, Intravenous, Q12H, Darel Hong D, NP, Stopped at 11/29/21 2216   fentaNYL (SUBLIMAZE) bolus via infusion 50-100 mcg, 50-100 mcg, Intravenous, Q15 min PRN, Darel Hong D, NP, 50 mcg at 11/30/21 0624   fentaNYL (SUBLIMAZE) injection 12.5 mcg, 12.5 mcg, Intravenous, Q2H PRN, Danford, Suann Larry, MD, 12.5 mcg at 11/29/21 0905   fentaNYL 2567mg in NS 2589m(1082mml) infusion-PREMIX, 50-200 mcg/hr, Intravenous, Continuous, KeeDarel Hong NP, Last Rate: 15 mL/hr at 11/30/21 0700, 150  mcg/hr at 11/30/21 0700   gentamicin cream (GARAMYCIN) 0.1 % 1 application, 1 application, Topical, Daily, Kolluru, Sarath, MD, 1 application at 70/14/10 1936   heparin ADULT infusion 100 units/mL (25000 units/237m), 2,100 Units/hr, Intravenous, Continuous, Kasa, Kurian, MD, Last Rate: 21 mL/hr at 11/30/21 0700, 2,100 Units/hr at 11/30/21 0700   hydrALAZINE (APRESOLINE) injection 5 mg, 5 mg,  Intravenous, Q4H PRN, KRise Patience MD   MEDLINE mouth rinse, 15 mL, Mouth Rinse, 10 times per day, KFlora Lipps MD, 15 mL at 11/30/21 0605   midazolam (VERSED) 100 mg/100 mL (1 mg/mL) premix infusion, 0.5-10 mg/hr, Intravenous, Continuous, Kasa, Kurian, MD, Last Rate: 1 mL/hr at 11/30/21 0700, 1 mg/hr at 11/30/21 0700   norepinephrine (LEVOPHED) 16 mg in 2533mpremix infusion, 0-40 mcg/min, Intravenous, Titrated, Rust-Chester, BrToribio Harbour, NP, Last Rate: 5.63 mL/hr at 11/30/21 0700, 6 mcg/min at 11/30/21 0700   pantoprazole (PROTONIX) injection 40 mg, 40 mg, Intravenous, Q24H, Rust-Chester, Britton L, NP, 40 mg at 11/30/21 0311   polyethylene glycol (MIRALAX / GLYCOLAX) packet 17 g, 17 g, Per Tube, Daily, KeDarel Hong, NP, 17 g at 11/30/21 0840   [COMPLETED] remdesivir 200 mg in sodium chloride 0.9% 250 mL IVPB, 200 mg, Intravenous, Once, Last Rate: 580 mL/hr at 12/13/2021 1846, 200 mg at 11/20/2021 1846 **FOLLOWED BY** remdesivir 100 mg in sodium chloride 0.9 % 100 mL IVPB, 100 mg, Intravenous, Daily, KaRise PatienceMD, Last Rate: 200 mL/hr at 11/29/21 1111, Restarted at 11/29/21 1111   sodium bicarbonate 150 mEq in dextrose 5 % 1,150 mL infusion, , Intravenous, Continuous, Singh, Harmeet, MD  Labs ABG pH 7.23 A1c 5.1 LDL 76 Anti thrombin 67 L Homocysteine 27.6 H dsDNA <1, C3 107,  C4 17   2D echo IMPRESSION   1. Left ventricular ejection fraction, by estimation, is 55 to 60%. The  left ventricle has normal function. The left ventricle has no regional  wall motion abnormalities. There is mild concentric left ventricular  hypertrophy. Left ventricular diastolic parameters are indeterminate. Elevated left atrial pressure.   2. Right ventricular systolic function is normal. The right ventricular  size is normal.   3. Left atrial size was severely dilated.   4. Restricted anterior and posterior mitral valve leaflet motion.  Compared with the echo in 2017, the mitral valve is  now heavily thickened and clacified. These findings are all new. Could represent endocarditis.  Recommend TEE to better evaluate. The mitral valve is degenerative. Moderate mitral valve regurgitation. Severe mitral stenosis. Severe mitral annular calcification.   5. Tricuspid valve regurgitation is moderate to severe.   6. The aortic valve is tricuspid. Aortic valve regurgitation is not  visualized. No aortic stenosis is present.   7. The inferior vena cava is normal in size with greater than 50%  respiratory variability, suggesting right atrial pressure of 3 mmHg.   Imaging I have reviewed images in epic and the results pertinent to this consultation are: CT-head-no acute changes.  No bleed.   MRI brain - scattered infarcts in the lateral right frontal lobe, posterior left frontal lobe with patchy acute small infarcts in the callosal splenium on the right with additional subacute infarcts question within the left frontoparietal white matter. 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 Chronic small vessel ischemic changes within the cerebral white matter.  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  Small chronic cortical subcortical infarcts within the right parietal occipital lobes.  Foot x-ray concerning for osteomyelitis. Repeat  CT head yesterday-negative for acute process US Venous Img Lower Bilateral  11/29/2021 - negative for evidence of deep venous thrombosis in either lower extremity to the level of the knees. Calf veins are not well seen due to patient body habitus and subcutaneous edema.   MRA brain without contrast - Pending  Assessment: 36 year old woman extensive vascular risk factors with acute encephalopathy found to have acute strokes. Exam initially encephalopathic with some left leg weakness and mild left facial droop with dysarthria. MRI showed infarcts of different ages not candidate for thrombolysis. Poor mRS precluded thrombectomy.  The following day she developed expressive aphasia intact comprehension with slight improvement of left weakness with some right arm drift, b/l LES antigravity but drift to the bed. Follow up NCT head did not show changes compared to prior. Started on heparin per vascular surgery due to concern for right lower extremity thrombus. Given concern for endocarditis clopidogrel stopped (may restart if indicated). COVID-19 (+) infection which is likely incidental as she was asymptomatic and started on remdesivir. Given the myriad of medical findings, including renal failure, vascular disease and now strokes, hypercoagulable work-up and a rheumatological work-up were ordered and thus far have been unrevealing.  Features on MRI suggest embolic strokes from central source. Additionally, the degree of encephalopathy on exam is significantly worse than acute stroke burden noted on MRI brain.  Impression -Acute encephalopathy -Acute multifocal embolic strokes - Likely central source -Peripheral vascular disease -ESRD on dialysis -Mitral valve abnormality on TTE-concern for infective endocarditis -Severe left atrial dilatation - Question underlying arrhythmia/PAF  Plan: Acute embolic strokes:  Imaging suggests central source.  Hypercoagulable panel unrevealing thus far. - Consider CTA head and neck when stable to complete stroke workup.  - Cardiology will consider TEE once stable (improved mental status, off vasopressors) but still intubated tentatively latter half of this week. -Continue antibiotics per primary team for presumed endocarditis/ osteomyelitis. -Continue telemetry -Continue heparin stroke protocol - Possible thrombus cardiac or lower extremity. - SBP goal 100-140 from a neurological standpoint.   Other comorbid conditions: ESRD: PD per nephrology.  COVID-19 (+) infection - Was asymptomatic.  - Started on remdesivir.  PAD: Per vascular surgery. Plans for intervention are on hold due to  encephalopathy, recurrent strokes and COVID-19 infection.  Electronically signed by:  Lynnae Sandhoff, MD Page: [phone] 11/30/2021, 9:18 AM

## 2021-11-30 NOTE — Consult Note (Signed)
[facility] for heparin Indication:  Thrombus  Allergies  Allergen Reactions   Percocet [Oxycodone-Acetaminophen] Itching   Tomato Itching   Gabapentin Other (See Comments)    Extremity tremors    Patient Measurements: Height: 5\' 6"  (167.6 cm) Weight: 131.5 kg (289 lb 14.5 oz) IBW/kg (Calculated) : 59.3 Heparin Dosing Weight: 92.3 Kg  Vital Signs: Temp: 96.6 F (35.9 C) (02/14 0500) Temp Source: Esophageal (02/14 0400) BP: 79/65 (02/14 0435) Pulse Rate: 86 (02/14 0400)  Labs: Recent Labs    11/27/21 0528 11/27/21 0906 11/28/21 0602 11/28/21 1500 11/28/21 2356 11/29/21 0342 11/29/21 0916 11/30/21 0314  HGB 8.6*  --  8.6*  --   --  8.5*  --   --   HCT 27.4*  --  27.4*  --   --  27.5*  --   --   PLT 242  --  253  --   --  274  --   --   HEPARINUNFRC  --    < > 0.44   < > 0.36  --  0.40 0.51  CREATININE 12.01*  --  11.50*  --   --  11.44*  --   --    < > = values in this interval not displayed.     Estimated Creatinine Clearance: 9.5 mL/min (A) (by C-G formula based on SCr of 11.44 mg/dL (H)).   Medical History: Past Medical History:  Diagnosis Date   Anemia of chronic disease    Asthma    well controlled-no inhaler   Calciphylaxis 2020   CHF (congestive heart failure) ([place])    CKD (chronic kidney disease), stage III ([place])    Diabetes ([place])    type 2-diet controlled   Dyspnea    Hypertension    Morbid obesity ([place])    Nephrotic syndrome    PAD (peripheral artery disease) ([place])    Sepsis ([place]) 06/2021    Medications:  No chronic anticoagulation PTA Heparin Dosing Weight: 92.3 Kg  Assessment: Pharmacy has been consulted to initiate Heparin in 35yo patient admitted with stroke-like symptoms. Her symptoms began to resolve, but neurologist noted concern for lower extremity ischemia, likely embolic.   Goal of Therapy:  Heparin level 0.3-0.5 units/ml (Per Stroke Protocol) Monitor platelets by anticoagulation  protocol: Yes  Date Time HL  Comment/rate 2/10 2222 HL < 0.10, subtherapeutic 2/11 0906 HL 0.12, subtherapeutic 2/11 2048 HL 0.22, subtherapeutic  2/12 0602 HL 0.44, therapeutic x 1 2/12 1500 HL 0.29, subtherapeutic; 2100>2200 un/hr 2/12 2356 HL 0.36, therapeutic x 1; 2200 un/hr 2/13 0916 HL 0.40, therapeutic x2; 2200 un/hr 2/14 0314 HL 0.51, Slightly SUPRAtherapeutic    Plan:  2/14:  HL @ 0314 = 0.51, SUPRAtherapeutic  Will decrease heparin drip to 2100 units/hr and recheck HL 8 hrs after rate change.  Felita Bump D Clinical Pharmacist 11/30/2021 5:16 AM

## 2021-11-30 NOTE — Consult Note (Signed)
Pharmacy Antibiotic Note  [name] is a 36 y.o. female admitted on 11/27/2021 with left foot wound infection. Patient had outpatient left foot debridement scheduled for today which was canceled due to concern for stroke. Pharmacy has been consulted for vancomycin & cefepime dosing. Patient is ESRD on PD. On 11/27/21 she was administered 2 grams IV vancomycin. Three days later a level was drawn as shown below:  Vancomycin Level: 11/30/21 1339 18 mcg/mL  Plan:  1) continue cefepime 1 gram IV every 24  hours  2) vancomycin is within the therapeutic range to re-dose:  The time between the previous dose and the therapeutic level is 72 hours: this will be the dosing interval Start vancomycin 2000 mg IV every 72 hours may recheck level every 5-7 days   Temp (24hrs), Avg:97.4 F (36.3 C), Min:96.2 F (35.7 C), Max:98.6 F (37 C)  Recent Labs  Lab 11/25/2021 1412 11/27/21 0528 11/28/21 0602 11/29/21 0342 11/30/21 0314 11/30/21 0430  WBC 16.6* 14.4* 17.4* 23.3*  --  30.5*  CREATININE 11.34* 12.01* 11.50* 11.44* 10.89*  --      Estimated Creatinine Clearance: 10 mL/min (A) (by C-G formula based on SCr of 10.89 mg/dL (H)).    Allergies  Allergen Reactions   Percocet [Oxycodone-Acetaminophen] Itching   Tomato Itching   Gabapentin Other (See Comments)    Extremity tremors    Antimicrobials this admission: 2/10 remdesivir >> 2/14 2/10 cefepime >>  2/11 vancomycin >>  Dose adjustments this admission: N/A; CTM since on ESRD-PD dosing  Microbiology results: 02/12 BCx: NGTD 02/11 BCx: NGTD 02/11 MRSA PCR: negative 02/10 SARS CoV-2: positive 02/10 influenza A/B: negative   Thank you for allowing pharmacy to be a part of this patients care.  Vallery Sa, PharmD, BCPS Clinical Pharmacist 11/30/2021 8:50 AM

## 2021-11-30 NOTE — Progress Notes (Signed)
[facility]  ROUNDING NOTE   Subjective:   [name] was admitted to [facility] on 12/14/2021 for PVD (peripheral vascular disease) ([place]) [I73.9] Pain [R52] Dysarthria [R47.1] Acute CVA (cerebrovascular accident) [facility]) [I63.9] Cerebrovascular 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 ([place]) [I63.40] COVID-19 [U07.1]  Patient remains critically ill.  Yesterday she became encephalopathic with respiratory distress therefore was intubated by the ICU team.  Patient is now requiring ventilator support and pressors.   Objective:  Vital signs in last 24 hours:  Temp:  [96.2 F (35.7 C)-99.3 F (37.4 C)] 98.8 F (37.1 C) (02/14 1200) Pulse Rate:  [79-92] 88 (02/14 1200) Resp:  [9-24] 11 (02/14 1200) BP: (67-96)/(20-65) 67/20 (02/14 0900) SpO2:  [93 %-100 %] 99 % (02/14 1338) Arterial Line BP: (82-118)/(41-74) 91/52 (02/14 1200) FiO2 (%):  [30 %-40 %] 30 % (02/14 1338) Weight:  [131.7 kg] 131.7 kg (02/14 0500)  Weight change:  Filed Weights   11/27/21 1628 11/28/21 0500 11/30/21 0500  Weight: 133.4 kg 131.5 kg 131.7 kg    Intake/Output: I/O last 3 completed shifts: In: 17136.1 [I.V.:1818.8; Other:15000; NG/GT:40; IV Piggyback:277.3] Out: 67619 [Other:14730]   Intake/Output this shift:  Total I/O In: 788.6 [I.V.:459.8; IV Piggyback:328.8] Out: 0   Physical Exam: General: Laying in bed  Head: ET tube in place  Lungs:  Ventilator assisted, coarse breath sounds  Heart: Regular  Abdomen:  Soft, nontender, PD catheter, obese  Extremities:  no peripheral edema. Left foot in dressings  Neurologic: Sedated  Skin: Warm  Access: PD catheter  Right femoral temporary dialysis catheter 11/29/2021.  Dr. [name]  Basic Metabolic Panel: Recent Labs  Lab 11/22/2021 1412 11/27/21 0528 11/28/21 0602 11/29/21 0342 11/30/21 0314  NA 140 138 136 134* 135  K 3.8 4.1 4.0 3.5 3.6  CL 94* 94* 93* 93* 95*  CO2 20* 17* 18* 15* 20*  GLUCOSE 122* 98 108* 152* 128*  BUN  65* 67* 67* 64* 56*  CREATININE 11.34* 12.01* 11.50* 11.44* 10.89*  CALCIUM 8.3* 8.2* 7.9* 7.8* 8.1*  MG  --   --  1.5* 1.6* 2.0  PHOS  --   --  10.0* 9.3* 10.8*     Liver Function Tests: Recent Labs  Lab 12/09/2021 1412 11/27/21 0528 11/28/21 0602 11/29/21 0342 11/30/21 0314  AST 11* 11* 11* 12* 10*  ALT 7 6 6  <5 5  ALKPHOS 131* 124 120 132* 129*  BILITOT 1.0 0.7 1.0 1.0 0.6  PROT 7.1 6.6 6.5 6.6 6.8  ALBUMIN 2.8* 2.7* 2.8* 2.7* 2.7*    No results for input(s): LIPASE, AMYLASE in the last 168 hours. No results for input(s): AMMONIA in the last 168 hours.  CBC: Recent Labs  Lab 12/08/2021 1320 11/30/2021 1412 11/27/21 0528 11/28/21 0602 11/29/21 0342 11/30/21 0430  WBC 17.2* 16.6* 14.4* 17.4* 23.3* 30.5*  NEUTROABS 14.0* 14.0* 11.8*  --   --   --   HGB 9.2* 9.4* 8.6* 8.6* 8.5* 8.8*  HCT 28.4* 29.6* 27.4* 27.4* 27.5* 28.9*  MCV 99.6 98.7 100.4* 100.4* 100.0 101.8*  PLT 280 247 242 253 274 267     Cardiac Enzymes: No results for input(s): CKTOTAL, CKMB, CKMBINDEX, TROPONINI in the last 168 hours.  BNP: Invalid input(s): POCBNP  CBG: Recent Labs  Lab 11/30/21 0005 11/30/21 0448 11/30/21 1131 11/30/21 1137 11/30/21 1327  GLUCAP 126* 121* 66* 92 32     Microbiology: Results for orders placed or performed during the hospital encounter of 11/28/2021  Resp Panel  by RT-PCR (Flu A&B, Covid) Nasopharyngeal Swab     Status: Abnormal   Collection Time: 12/10/2021  2:12 PM   Specimen: Nasopharyngeal Swab; Nasopharyngeal(NP) swabs in vial transport medium  Result Value Ref Range Status   SARS Coronavirus 2 by RT PCR POSITIVE (A) NEGATIVE Final    Comment: (NOTE) SARS-CoV-2 target nucleic acids are DETECTED.  The SARS-CoV-2 RNA is generally detectable in upper respiratory specimens during the acute phase of infection. Positive results are indicative of the presence of the identified virus, but do not rule out bacterial infection or co-infection with other pathogens  not detected by the test. Clinical correlation with patient history and other diagnostic information is necessary to determine patient infection status. The expected result is Negative.  Fact Sheet for Patients: EntrepreneurPulse.com.au  Fact Sheet for Healthcare Providers: IncredibleEmployment.be  This test is not yet approved or cleared by the [place] FDA and  has been authorized for detection and/or diagnosis of SARS-CoV-2 by FDA under an Emergency Use Authorization (EUA).  This EUA will remain in effect (meaning this test can be used) for the duration of  the COVID-19 declaration under Section 564(b)(1) of the A ct, 21 U.S.C. section 360bbb-3(b)(1), unless the authorization is terminated or revoked sooner.     Influenza A by PCR NEGATIVE NEGATIVE Final   Influenza B by PCR NEGATIVE NEGATIVE Final    Comment: (NOTE) The Xpert Xpress SARS-CoV-2/FLU/RSV plus assay is intended as an aid in the diagnosis of influenza from Nasopharyngeal swab specimens and should not be used as a sole basis for treatment. Nasal washings and aspirates are unacceptable for Xpert Xpress SARS-CoV-2/FLU/RSV testing.  Fact Sheet for Patients: EntrepreneurPulse.com.au  Fact Sheet for Healthcare Providers: IncredibleEmployment.be  This test is not yet approved or cleared by the [place] FDA and has been authorized for detection and/or diagnosis of SARS-CoV-2 by FDA under an Emergency Use Authorization (EUA). This EUA will remain in effect (meaning this test can be used) for the duration of the COVID-19 declaration under Section 564(b)(1) of the Act, 21 U.S.C. section 360bbb-3(b)(1), unless the authorization is terminated or revoked.  Performed at [facility], [address] [place], [place] [address]   CULTURE, BLOOD (ROUTINE X 2) w Reflex to ID Panel     Status: None (Preliminary result)   Collection  Time: 11/27/21 10:32 AM   Specimen: BLOOD  Result Value Ref Range Status   Specimen Description BLOOD BLOOD RIGHT HAND  Final   Special Requests   Final    BOTTLES DRAWN AEROBIC AND ANAEROBIC Blood Culture adequate volume   Culture   Final    NO GROWTH 3 DAYS Performed at [facility], [address], [place], [place] [address]    Report Status PENDING  Incomplete  CULTURE, BLOOD (ROUTINE X 2) w Reflex to ID Panel     Status: None (Preliminary result)   Collection Time: 11/27/21 10:32 AM   Specimen: BLOOD  Result Value Ref Range Status   Specimen Description BLOOD BLOOD RIGHT ARM  Final   Special Requests   Final    BOTTLES DRAWN AEROBIC AND ANAEROBIC Blood Culture adequate volume   Culture   Final    NO GROWTH 3 DAYS Performed at [facility], [address], [place], [place] [address]    Report Status PENDING  Incomplete  MRSA Next Gen by PCR, Nasal     Status: None   Collection Time: 11/27/21  4:24 PM   Specimen: Nasal Mucosa; Nasal  Swab  Result Value Ref Range Status   MRSA by PCR Next Gen NOT DETECTED NOT DETECTED Final    Comment: (NOTE) The GeneXpert MRSA Assay (FDA approved for NASAL specimens only), is one component of a comprehensive MRSA colonization surveillance program. It is not intended to diagnose MRSA infection nor to guide or monitor treatment for MRSA infections. Test performance is not FDA approved in patients less than 33 years old. Performed at [facility], [address] [place],  [place] [address]   CULTURE, BLOOD (ROUTINE X 2) w Reflex to ID Panel     Status: None (Preliminary result)   Collection Time: 11/28/21 11:42 AM   Specimen: BLOOD  Result Value Ref Range Status   Specimen Description BLOOD BLOOD RIGHT HAND  Final   Special Requests   Final    BOTTLES DRAWN AEROBIC AND ANAEROBIC Blood Culture adequate volume   Culture   Final    NO GROWTH 2 DAYS Performed at [facility], [address]  [place], [place] [address]    Report Status PENDING  Incomplete  CULTURE, BLOOD (ROUTINE X 2) w Reflex to ID Panel     Status: None (Preliminary result)   Collection Time: 11/28/21 11:42 AM   Specimen: BLOOD  Result Value Ref Range Status   Specimen Description BLOOD RIGHT ANTECUBITAL  Final   Special Requests   Final    AEROBIC BOTTLE ONLY Blood Culture results may not be optimal due to an inadequate volume of blood received in culture bottles   Culture   Final    NO GROWTH 2 DAYS Performed at [facility], [address] [place], [place] [address]    Report Status PENDING  Incomplete  Wet prep, genital     Status: Abnormal   Collection Time: 11/29/21  9:16 AM   Specimen: Vaginal  Result Value Ref Range Status   Yeast Wet Prep HPF POC NONE SEEN NONE SEEN Final   Trich, Wet Prep NONE SEEN NONE SEEN Final   Clue Cells Wet Prep HPF POC PRESENT (A) NONE SEEN Final   WBC, Wet Prep HPF POC >=10 (A) <10 Final   Sperm NONE SEEN  Final    Comment: Performed at [facility], [address] [place], [place] [address]    Coagulation Studies: No results for input(s): LABPROT, INR in the last 72 hours.   Urinalysis: No results for input(s): COLORURINE, LABSPEC, PHURINE, GLUCOSEU, HGBUR, BILIRUBINUR, KETONESUR, PROTEINUR, UROBILINOGEN, NITRITE, LEUKOCYTESUR in the last 72 hours.  Invalid input(s): APPERANCEUR    Imaging: US Venous Img Lower Bilateral (DVT)  Result Date: 11/29/2021 CLINICAL DATA:  Acute ischemic stroke, bilateral lower extremity swelling. Assess for DVT. EXAM: BILATERAL LOWER EXTREMITY VENOUS DOPPLER ULTRASOUND TECHNIQUE: Gray-scale sonography with graded compression, as well as color Doppler and duplex ultrasound were performed to evaluate the lower extremity deep venous systems from the level of the common femoral vein and including the common femoral, femoral, profunda femoral, popliteal and calf veins including the posterior tibial, peroneal and  gastrocnemius veins when visible. The superficial great saphenous vein was also interrogated. Spectral Doppler was utilized to evaluate flow at rest and with distal augmentation maneuvers in the common femoral, femoral and popliteal veins. COMPARISON:  None. FINDINGS: RIGHT LOWER EXTREMITY Common Femoral Vein: No evidence of thrombus. Normal compressibility, respiratory phasicity and response to augmentation. Saphenofemoral Junction: No evidence of thrombus. Normal compressibility and flow on color Doppler imaging. Profunda Femoral Vein: No evidence of thrombus. Normal compressibility and  flow on color Doppler imaging. Femoral Vein: No evidence of thrombus. Normal compressibility, respiratory phasicity and response to augmentation. Popliteal Vein: No evidence of thrombus. Normal compressibility, respiratory phasicity and response to augmentation. Calf Veins: Limited visualization of the calf veins. No obvious thrombus. Superficial Great Saphenous Vein: No evidence of thrombus. Normal compressibility. Venous Reflux:  None. Other Findings:  None. LEFT LOWER EXTREMITY Common Femoral Vein: No evidence of thrombus. Normal compressibility, respiratory phasicity and response to augmentation. Saphenofemoral Junction: No evidence of thrombus. Normal compressibility and flow on color Doppler imaging. Profunda Femoral Vein: No evidence of thrombus. Normal compressibility and flow on color Doppler imaging. Femoral Vein: No evidence of thrombus. Normal compressibility, respiratory phasicity and response to augmentation. Popliteal Vein: No evidence of thrombus. Normal compressibility, respiratory phasicity and response to augmentation. Calf Veins: Limited visualization of the calf veins. No obvious thrombus. Superficial Great Saphenous Vein: No evidence of thrombus. Normal compressibility. Venous Reflux:  None. Other Findings:  None. IMPRESSION: No evidence of deep venous thrombosis in either lower extremity to the level of the  knees. Calf veins are not well seen due to patient body habitus and subcutaneous edema. Electronically Signed   By: Jacqulynn Cadet M.D.   On: 11/29/2021 07:06   DG Chest Port 1 View  Result Date: 11/30/2021 CLINICAL DATA:  Acute respiratory failure with hypoxia EXAM: PORTABLE CHEST 1 VIEW COMPARISON:  Chest radiograph from one day prior. FINDINGS: Endotracheal tube tip is 2.0 cm above the carina. Enteric tube enters stomach with the tip not seen on this image. Stable cardiomediastinal silhouette with mild to moderate cardiomegaly. No pneumothorax. No pleural effusion. Low lung volumes. Mild pulmonary edema, improved. IMPRESSION: 1. Well-positioned support structures. 2. Mild congestive heart failure, improved. 3. Low lung volumes. Electronically Signed   By: Ilona Sorrel M.D.   On: 11/30/2021 08:08   Portable Chest x-ray  Result Date: 11/29/2021 CLINICAL DATA:  Intubated EXAM: PORTABLE CHEST 1 VIEW COMPARISON:  11/27/2021 FINDINGS: Initial film is rotated to the left which was repeated. Endotracheal tube approximately 2.5 cm above the carina. NG tube within the stomach. Heart is enlarged with vascular congestion and diffuse interstitial prominence suggesting component of mild interstitial edema. Difficult to exclude small posterior layering effusions. Associated bibasilar atelectasis. No large pneumothorax. IMPRESSION: Limited exam but endotracheal tube approximately 2.5 cm above the carina. Cardiomegaly with mild interstitial edema pattern and basilar atelectasis. Electronically Signed   By: Jerilynn Mages.  Shick M.D.   On: 11/29/2021 12:08     Medications:    sodium chloride     ceFEPime (MAXIPIME) IV Stopped (11/30/21 1119)   dialysis solution 1.5% low-MG/low-CA     [START ON 11/22/2021] famotidine (PEPCID) IV     fentaNYL infusion INTRAVENOUS 150 mcg/hr (11/30/21 1231)   heparin 2,100 Units/hr (11/30/21 1231)   metronidazole 100 mL/hr at 11/30/21 1231   midazolam 1 mg/hr (11/30/21 1231)    norepinephrine (LEVOPHED) Adult infusion 10 mcg/min (11/30/21 1231)   sodium bicarbonate 150 mEq in D5W infusion 75 mL/hr at 11/30/21 1231   vancomycin      vitamin C  500 mg Per Tube BID   aspirin  300 mg Rectal Daily   Or   aspirin  325 mg Oral Daily   chlorhexidine gluconate (MEDLINE KIT)  15 mL Mouth Rinse BID   Chlorhexidine Gluconate Cloth  6 each Topical Daily   collagenase   Topical Daily   docusate  100 mg Per Tube BID   [START ON 12/07/2021] feeding supplement (PROSource TF)  45 mL Per Tube TID   feeding supplement (VITAL AF 1.2 CAL)  1,000 mL Per Tube Q24H   free water  30 mL Per Tube Q4H   gentamicin cream  1 application Topical Daily   mouth rinse  15 mL Mouth Rinse 10 times per day   [START ON 11/29/2021] multivitamin  1 tablet Per Tube QHS   pantoprazole (PROTONIX) IV  40 mg Intravenous Q24H   polyethylene glycol  17 g Per Tube Daily   sodium chloride, acetaminophen **OR** acetaminophen (TYLENOL) oral liquid 160 mg/5 mL **OR** acetaminophen, fentaNYL, fentaNYL (SUBLIMAZE) injection, hydrALAZINE  Assessment/ Plan:  [name] is a 36 y.o. 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 She was scheduled for debridement of her left foot wound later that day. Procedure was cancelled.   [facility] Peritoneal [facility] Shanon Payor 132kg CCPD 9 hours 5 exchanges 3047m fills and last fill of 20054m End Stage Renal Disease: Peritoneal dialysis treatment last night. Tolerated treatment well. Will continue PD treatments.  Will monitor  electrolytes closely.  No acute need for CRRT at the moment  Anemia of kidney disease:   Lab Results  Component Value Date   HGB 8.8 (L) 11/30/2021   Hold off on EPO due to ischemic event.   Secondary Hyperparathyroidism with hyperphosphatemia and calciphylaxis: Labs from 2/7: PTH 1882, calcium 8 and phos 8.1. Getting sodium thiosulfate weekly.  - When patient is able to take PO, recommend restarting binders.  5. 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 Hypoglycemia on 2/10. Hemoglobin A1c of 5.1% on admission - monitor blood sugars  6. Altered mental status: with concerns for acute ischemic stroke.   MRI shows multiple chronic vessel small ischemic changes and lacunar infarcts concerning for embolic disease.  2D echo from November 27, 2021-LVEF 55 to 60%, mild LVH, elevated left atrial pressure and severe dilatation, restricted anterior and posterior mitral valve leaflet motion, mitral valve heavily thickened and calcified, could represent endocarditis, severe mitral stenosis, severe mitral annular calcifications, moderate to severe tricuspid regurgitation -Currently on broad-spectrum antibiotics for possible mitral valve endocarditis -Continue on heparin for embolic stroke  7. 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 Patient with high risk of requiring amputation.   8. COVID-19 infection: placed on isolation and remdesivir. Support care.   9.  Acute respiratory failure Intubated 11/29/2021 Management as per ICU team     LOS: 4 Dorlis Judice 2/14/20232:43 PM

## 2021-11-30 NOTE — Progress Notes (Signed)
Progress Note  Patient Name: [name] Date of Encounter: 11/30/2021  [facility] Cardiologist: None   Subjective   Patient remains intubated, sedated, and on norepinephrine for BP support  Inpatient Medications    Scheduled Meds:  aspirin  300 mg Rectal Daily   Or   aspirin  325 mg Oral Daily   chlorhexidine gluconate (MEDLINE KIT)  15 mL Mouth Rinse BID   Chlorhexidine Gluconate Cloth  6 each Topical Daily   collagenase   Topical Daily   docusate  100 mg Per Tube BID   gentamicin cream  1 application Topical Daily   mouth rinse  15 mL Mouth Rinse 10 times per day   pantoprazole (PROTONIX) IV  40 mg Intravenous Q24H   polyethylene glycol  17 g Per Tube Daily   Continuous Infusions:  sodium chloride     ceFEPime (MAXIPIME) IV Stopped (11/29/21 1208)   dextrose 25 mL/hr at 11/30/21 0700   dialysis solution 1.5% low-MG/low-CA     famotidine (PEPCID) IV Stopped (11/29/21 2216)   fentaNYL infusion INTRAVENOUS 150 mcg/hr (11/30/21 0700)   heparin 2,100 Units/hr (11/30/21 0700)   midazolam 1 mg/hr (11/30/21 0700)   norepinephrine (LEVOPHED) Adult infusion 6 mcg/min (11/30/21 0700)   remdesivir 100 mg in NS 100 mL 200 mL/hr at 11/29/21 1111   PRN Meds: sodium chloride, acetaminophen **OR** acetaminophen (TYLENOL) oral liquid 160 mg/5 mL **OR** acetaminophen, fentaNYL, fentaNYL (SUBLIMAZE) injection, hydrALAZINE   Vital Signs    Vitals:   11/30/21 0630 11/30/21 0645 11/30/21 0700 11/30/21 0715  BP:      Pulse: 83 83 83 83  Resp: 10 13 13 13   Temp: (!) 97.5 F (36.4 C) 97.7 F (36.5 C) 97.7 F (36.5 C) 97.9 F (36.6 C)  TempSrc:      SpO2: 100% 100% 100% 100%  Weight:      Height:        Intake/Output Summary (Last 24 hours) at 11/30/2021 0739 Last data filed at 11/30/2021 0700 Gross per 24 hour  Intake 16920.48 ml  Output 14730 ml  Net 2190.48 ml   Last 3 Weights 11/30/2021 11/28/2021 11/27/2021  Weight (lbs) 290 lb 5.5 oz 289 lb 14.5 oz 294 lb 1.5  oz  Weight (kg) 131.7 kg 131.5 kg 133.4 kg      Telemetry    NSR with intermittent bundle branch block - Personally Reviewed  ECG    No new tracing  Physical Exam   GEN: Intubated and sedated Neck: Unable to assess JVP due to body habitus, positioning, and support devices. Cardiac: Distant heart sounds.  RRR w/o murmurs. Respiratory: Coarse breath sounds anteriorly. GI: Soft, nontender, non-distended  MS: Trace pretibial edema.  Right foot appears dusky.  Left foot is partially surgically absent and wrapped in gauze. Neuro:  Intubated and sedated. Psych: Unable to assess as the patient is intubated and sedated.  Labs    High Sensitivity Troponin:  No results for input(s): TROPONINIHS in the last 720 hours.   Chemistry Recent Labs  Lab 11/28/21 0602 11/29/21 0342 11/30/21 0314  NA 136 134* 135  K 4.0 3.5 3.6  CL 93* 93* 95*  CO2 18* 15* 20*  GLUCOSE 108* 152* 128*  BUN 67* 64* 56*  CREATININE 11.50* 11.44* 10.89*  CALCIUM 7.9* 7.8* 8.1*  MG 1.5* 1.6* 2.0  PROT 6.5 6.6 6.8  ALBUMIN 2.8* 2.7* 2.7*  AST 11* 12* 10*  ALT 6 <5 5  ALKPHOS 120 132* 129*  BILITOT  1.0 1.0 0.6  GFRNONAA 4* 4* 4*  ANIONGAP 25* 26* 20*    Lipids  Recent Labs  Lab 11/21/2021 1830 11/30/21 0315  CHOL 125  --   TRIG 73 125  HDL 34*  --   LDLCALC 76  --   CHOLHDL 3.7  --     Hematology Recent Labs  Lab 11/28/21 0602 11/29/21 0342 11/30/21 0430  WBC 17.4* 23.3* 30.5*  RBC 2.73* 2.75* 2.84*  HGB 8.6* 8.5* 8.8*  HCT 27.4* 27.5* 28.9*  MCV 100.4* 100.0 101.8*  MCH 31.5 30.9 31.0  MCHC 31.4 30.9 30.4  RDW 18.0* 18.0* 18.2*  PLT 253 274 267   Thyroid No results for input(s): TSH, FREET4 in the last 168 hours.  BNPNo results for input(s): BNP, PROBNP in the last 168 hours.  DDimer  Recent Labs  Lab 11/20/2021 2222 11/27/21 0528 11/30/21 0314  DDIMER 0.72* 0.61* 2.94*     Radiology    US Venous Img Lower Bilateral (DVT)  Result Date: 11/29/2021 CLINICAL DATA:  Acute  ischemic stroke, bilateral lower extremity swelling. Assess for DVT. EXAM: BILATERAL LOWER EXTREMITY VENOUS DOPPLER ULTRASOUND TECHNIQUE: Gray-scale sonography with graded compression, as well as color Doppler and duplex ultrasound were performed to evaluate the lower extremity deep venous systems from the level of the common femoral vein and including the common femoral, femoral, profunda femoral, popliteal and calf veins including the posterior tibial, peroneal and gastrocnemius veins when visible. The superficial great saphenous vein was also interrogated. Spectral Doppler was utilized to evaluate flow at rest and with distal augmentation maneuvers in the common femoral, femoral and popliteal veins. COMPARISON:  None. FINDINGS: RIGHT LOWER EXTREMITY Common Femoral Vein: No evidence of thrombus. Normal compressibility, respiratory phasicity and response to augmentation. Saphenofemoral Junction: No evidence of thrombus. Normal compressibility and flow on color Doppler imaging. Profunda Femoral Vein: No evidence of thrombus. Normal compressibility and flow on color Doppler imaging. Femoral Vein: No evidence of thrombus. Normal compressibility, respiratory phasicity and response to augmentation. Popliteal Vein: No evidence of thrombus. Normal compressibility, respiratory phasicity and response to augmentation. Calf Veins: Limited visualization of the calf veins. No obvious thrombus. Superficial Great Saphenous Vein: No evidence of thrombus. Normal compressibility. Venous Reflux:  None. Other Findings:  None. LEFT LOWER EXTREMITY Common Femoral Vein: No evidence of thrombus. Normal compressibility, respiratory phasicity and response to augmentation. Saphenofemoral Junction: No evidence of thrombus. Normal compressibility and flow on color Doppler imaging. Profunda Femoral Vein: No evidence of thrombus. Normal compressibility and flow on color Doppler imaging. Femoral Vein: No evidence of thrombus. Normal  compressibility, respiratory phasicity and response to augmentation. Popliteal Vein: No evidence of thrombus. Normal compressibility, respiratory phasicity and response to augmentation. Calf Veins: Limited visualization of the calf veins. No obvious thrombus. Superficial Great Saphenous Vein: No evidence of thrombus. Normal compressibility. Venous Reflux:  None. Other Findings:  None. IMPRESSION: No evidence of deep venous thrombosis in either lower extremity to the level of the knees. Calf veins are not well seen due to patient body habitus and subcutaneous edema. Electronically Signed   By: Jacqulynn Cadet M.D.   On: 11/29/2021 07:06   Portable Chest x-ray  Result Date: 11/29/2021 CLINICAL DATA:  Intubated EXAM: PORTABLE CHEST 1 VIEW COMPARISON:  11/27/2021 FINDINGS: Initial film is rotated to the left which was repeated. Endotracheal tube approximately 2.5 cm above the carina. NG tube within the stomach. Heart is enlarged with vascular congestion and diffuse interstitial prominence suggesting component of mild interstitial edema. Difficult  to exclude small posterior layering effusions. Associated bibasilar atelectasis. No large pneumothorax. IMPRESSION: Limited exam but endotracheal tube approximately 2.5 cm above the carina. Cardiomegaly with mild interstitial edema pattern and basilar atelectasis. Electronically Signed   By: Jerilynn Mages.  Shick M.D.   On: 11/29/2021 12:08    Cardiac Studies   TTE (11/27/2021):  1. Left ventricular ejection fraction, by estimation, is 55 to 60%. The  left ventricle has normal function. The left ventricle has no regional  wall motion abnormalities. There is mild concentric left ventricular  hypertrophy. Left ventricular diastolic  parameters are indeterminate. Elevated left atrial pressure.   2. Right ventricular systolic function is normal. The right ventricular  size is normal.   3. Left atrial size was severely dilated.   4. Restricted anterior and posterior mitral  valve leaflet motion.  Compared with the echo in 2017, the mitral valve is now heavily thickened  and clacified. These findings are all new. Could represent endocarditis.  Recommend TEE to better evaluate. The  mitral valve is degenerative. Moderate mitral valve regurgitation. Severe  mitral stenosis. Severe mitral annular calcification.   5. Tricuspid valve regurgitation is moderate to severe.   6. The aortic valve is tricuspid. Aortic valve regurgitation is not  visualized. No aortic stenosis is present.   7. The inferior vena cava is normal in size with greater than 50%  respiratory variability, suggesting right atrial pressure of 3 mmHg.   Patient Profile     36 y.o. 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  Assessment & Plan    Mitral stenosis and possible endocarditis: TTE personally reviewed, showing severely thickened and calcified mitral valve.  Mean gradient 15 mmHg in setting of HR > 100 bpm.  Valve area more consistent with moderate MS.  Given multiple embolic-appearing strokes and osteomyelitis, endocarditis is a concern, though blood cx this admission have been negative. - Agree with plans for TEE, ideally when patient is more stable (improved mental status, off vasopressors) but still intubated.  We will reassess the patient tomorrow with tentative plans for TEE in the latter half of this week, if possible. - Patient would be poor candidate for mitral valve repair/replacement. - Consider repeating blood cx.  Stroke: Most likely embolic. - Continue heparin and treatment for possible mitral valve endocarditis.  Shock: Most likely septic, possibly confounded by sedation. - Pressor  support per CCM.  Try to avoid tachycardia, as this will accentuate hemodynamic effects of mitral stenosis.  PAD: CLI present with initial plans for intervention per vascular surgery.  However, this has been placed on hold in the setting of AMS, recurrent strokes, and COVID-19 infection. - Per vascular surgery and CCM.  ESRD: Continue PD per nephrology.  For questions or updates, please contact [place] Please consult [email or web address] for contact info under [facility].     Signed, Nelva Bush, MD  11/30/2021, 7:39 AM

## 2021-11-30 NOTE — Progress Notes (Signed)
Initial Nutrition Assessment  DOCUMENTATION CODES:   Morbid obesity  INTERVENTION:   Vital 1.2 @60ml /hr- Initiate at 47ml/hr, once tolerating, advance by 17ml/hr q 8 hours until goal rate is reached.   Pro-Source 68ml TID via tube, provides 40kcal and 11g of protein per serving   Free water flushes 52ml q4 hours to maintain tube patency   Regimen provides 1848kcal/day, 141g/day protein and 1353ml/day of free water   Pt at high refeed risk; recommend monitor potassium, magnesium and phosphorus labs daily until stable  Rena-vit daily via tube   Vitamin C 500mg  BID via tube   NUTRITION DIAGNOSIS:   Inadequate oral intake related to inability to eat (pt sedated and ventilated) as evidenced by NPO status.  GOAL:   Provide needs based on ASPEN/SCCM guidelines  MONITOR:   Labs, Weight trends, Vent status, TF tolerance, Skin, I & O's  REASON FOR ASSESSMENT:   Ventilator    ASSESSMENT:   36 y/o female with h/o ESRD on PD, CHF, DM, HTN and PAD who is admitted with COVID 19, stroke, AKI and osteomyelitis.  Pt sedated and ventilated. OGT in place. Plan is to start trickle feeds today. Per chart, pt is down 8lbs(2%) over the past 2 weeks; this is not significant. RD suspects pt with decreased oral intake pta. Pt being evaluated for trach. Cardiology following; plan is for TEE once pt is more stable. Pt continues on PD.   Medications reviewed and include: aspirin, colace, protonix, miralax, cefepime, pepcid, heparin, metronidazole, levophed, Na bicarb  Labs reviewed: K 3.6 wnl, BUN 56(H), creat 10.89(H), P 10.8(H), Mg 2.0 wnl Wbc- 30.5(H), Hgb 8.8(L), Hct 28.9(L) Cbgs- 92, 66, 121, 126 x 24 hrs AIC 5.1- 2/10  Patient is currently intubated on ventilator support MV: 6.4 L/min Temp (24hrs), Avg:97.4 F (36.3 C), Min:96.2 F (35.7 C), Max:98.6 F (37 C)  Propofol: none   MAP- >16mmHg   NUTRITION - FOCUSED PHYSICAL EXAM:  Flowsheet Row Most Recent Value  Orbital Region  No depletion  Upper Arm Region No depletion  Thoracic and Lumbar Region No depletion  Buccal Region No depletion  Temple Region No depletion  Clavicle Bone Region No depletion  Clavicle and Acromion Bone Region No depletion  Scapular Bone Region No depletion  Dorsal Hand No depletion  Patellar Region No depletion  Anterior Thigh Region No depletion  Posterior Calf Region No depletion  Edema (RD Assessment) Moderate  Hair Reviewed  Eyes Reviewed  Mouth Reviewed  Skin Reviewed  Nails Reviewed   Diet Order:   Diet Order             Diet NPO time specified  Diet effective now                  EDUCATION NEEDS:   No education needs have been identified at this time  Skin:  Skin Assessment: Reviewed RN Assessment (closed incision L toe)  Last BM:  pta  Height:   Ht Readings from Last 1 Encounters:  11/27/21 5\' 6"  (1.676 m)    Weight:   Wt Readings from Last 1 Encounters:  11/30/21 131.7 kg    Ideal Body Weight:  59 kg  BMI:  Body mass index is 46.86 kg/m.  Estimated Nutritional Needs:   Kcal:  1760kcal/day  Protein:  120-150g/day  Fluid:  UOP +1L  Koleen Distance MS, RD, LDN Please refer to [facility] for RD and/or RD on-call/weekend/after hours pager

## 2021-11-30 NOTE — Plan of Care (Addendum)
Patient remains intubated, sedated, on pressors. On PD overnight. PRN Vecuronium X 1 overnight; also requiring increase in sedation for vent synchrony. Now on 3 pressors.    Problem: Education: Goal: Knowledge of disease or condition will improve Outcome: Not Progressing Goal: Knowledge of secondary prevention will improve (SELECT ALL) Outcome: Not Progressing Goal: Knowledge of patient specific risk factors will improve (INDIVIDUALIZE FOR PATIENT) Outcome: Not Progressing Goal: Individualized Educational Video(s) Outcome: Not Progressing   Problem: Coping: Goal: Will verbalize positive feelings about self Outcome: Not Progressing Goal: Will identify appropriate support needs Outcome: Not Progressing   Problem: Health Behavior/Discharge Planning: Goal: Ability to manage health-related needs will improve Outcome: Not Progressing   Problem: Self-Care: Goal: Ability to participate in self-care as condition permits will improve Outcome: Not Progressing Goal: Verbalization of feelings and concerns over difficulty with self-care will improve Outcome: Not Progressing Goal: Ability to communicate needs accurately will improve Outcome: Not Progressing   Problem: Nutrition: Goal: Risk of aspiration will decrease Outcome: Not Progressing Goal: Dietary intake will improve Outcome: Not Progressing   Problem: Ischemic Stroke/TIA Tissue Perfusion: Goal: Complications of ischemic stroke/TIA will be minimized Outcome: Not Progressing   Problem: Spontaneous Subarachnoid Hemorrhage Tissue Perfusion: Goal: Complications of Spontaneous Subarachnoid Hemorrhage will be minimized Outcome: Not Progressing   Problem: Education: Goal: Knowledge of General Education information will improve Description: Including pain rating scale, medication(s)/side effects and non-pharmacologic comfort measures Outcome: Not Progressing   Problem: Health Behavior/Discharge Planning: Goal: Ability to manage  health-related needs will improve Outcome: Not Progressing   Problem: Clinical Measurements: Goal: Ability to maintain clinical measurements within normal limits will improve Outcome: Not Progressing Goal: Will remain free from infection Outcome: Not Progressing Goal: Diagnostic test results will improve Outcome: Not Progressing Goal: Respiratory complications will improve Outcome: Not Progressing Goal: Cardiovascular complication will be avoided Outcome: Not Progressing   Problem: Activity: Goal: Risk for activity intolerance will decrease Outcome: Not Progressing   Problem: Nutrition: Goal: Adequate nutrition will be maintained Outcome: Not Progressing   Problem: Coping: Goal: Level of anxiety will decrease Outcome: Not Progressing   Problem: Elimination: Goal: Will not experience complications related to bowel motility Outcome: Not Progressing Goal: Will not experience complications related to urinary retention Outcome: Not Progressing   Problem: Pain Managment: Goal: General experience of comfort will improve Outcome: Not Progressing   Problem: Safety: Goal: Ability to remain free from injury will improve Outcome: Not Progressing   Problem: Skin Integrity: Goal: Risk for impaired skin integrity will decrease Outcome: Not Progressing

## 2021-11-30 NOTE — Progress Notes (Signed)
NAME:  [name], MRN:  [id number], DOB:  [birth year], LOS: 4 ADMISSION DATE:  11/25/2021, CONSULTATION DATE:  11/27/21 REFERRING MD:  Dr. Loleta Books, CHIEF COMPLAINT:  CODE STROKE   SYNOPSIS  36 yo F presenting to [facility] ED with complaints of difficulty speaking & left sided hemiparesis around 11:30 AM on 12/10/2021.  Patient was originally scheduled for a vascular procedure on 12/07/2021 and prior to arriving she had taken antihypertensives at which point her mother states the patient became confused and had difficulty speaking as well as difficulty moving her left side.   ED course: CODE STROKE initiated patient evaluated by neurology.  SEVERE ENCEPHALOPATHY-MRI showed acute and subacute CVA in multiple vascular territories and the patient was admitted by [facility] for further stroke work-up.  It was also noted on admission the patient was pulseless in the right foot for which vascular surgery was consulted. medications given: Patient was started on heparin drip per vascular surgery recommendation of which neurology is aware. COVID-19 & Influenza A/B: COVID-positive CXR 12/03/2021: Cardiomegaly with mild pulmonary congestion X-ray left foot 2 views 11/25/2021: postsurgical changes of transmetatarsal amputation of the first through fifth rays. new bony irregularity along the resection margins of the third, fourth and fifth metatarsals concerning for osteomyelitis MRI brain without contrast 12/10/2021: Subcentimeter acute cortically based infarcts with the lateral right frontal lobe and posterior left frontal lobe. patchy small acute infarcts within the callosal splenium on the right additional small recent infarcts (measuring up to 12 mm) are questioned within the left frontoparietal white matter given the small infarcts involving multiple vascular territories there is a suspicion for embolic process. small chronic cortical and subcortical infarct within the right parietoccipital lobes.  Chronic hemorrhagic  lacunar infarct within the left caudate nucleus.  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  Mild mucosal thickening within the bilateral ethmoid sinuses.  Trapped fluid within the left petrous apex.  Pertinent  Medical History  PAD Anemia of chronic disease T2DM Asthma ESRD on PD HFpEF HTN Obesity calciphylaxis  Significant Hospital Events: Including procedures, antibiotic start and stop dates in addition to other pertinent events   2/10 [facility]  admission and neurology consulted for acute /subacute multiple infarcts.  Admit to inpatient as CODE STROKE with multiple acute/subacute infarcts on MRI, ESRD on PD, acute RLE ischemia & suspected osteomyelitis on L foot with Neurology, Nephrology & Vascular surgery following Vascular surgery consulted for assistance with right lower extremity ischemia.  Patient was started on heparin drip with additional imaging and work-up for acute and subacute CVA.  Antibiotics started for concerns of osteomyelitis in the foot. COVID +  2/11 Echocardiogram LVEF 55 to 60%, no regional wall motion abnormalities, mild concentric LVH, elevated LA pressure, RV systolic function is normal, LA size severely dilated. Restricted anterior and posterior mitral valve leaflet motion compared with the echo in 2017 with concern for endocarditis. Moderate MR, Severe MS, moderate-severe TR, no AS.   11/27/21: Early in the day neurology documented the patient has aphasic but inact in terms of following commands, with improved left sided hemiparesis and new right arm drift. Per TRH documentation, patient received Dilaudid for severe pain in ischemic LE, then waking up from a nap more confused with more Right sided hemiparesis. STAT CTH ordered. While in CT patient became acutely hypoxic when laid flat, desaturations in the 70's. Rapid response called and patient transferred to SDU for monitoring.  2/11 PCCM consulted for additional management and monitoring  while in  SDU, developed hypoxia while laying flat for CT scan  11/28/21- possible endocarditis.  She also has severe comorbid conditions that require multiple specialists involved. Together the entire medical team reviewed plan and agreement to transfer to [facility] for additional evaluation and management. reviewed medical plan with family including mother Chyla Schlender.  2/13 remains encephalopathic, increased WOB, emergently intubated, family updated 2/14 remains on vent, pressors, consider CRRT  Interim History / Subjective:  Remains encephalopathi Intubated on full vent support +septic shock +RT lef ischemia +brain damage from strokes High risk for cardiac arrest  Objective   Blood pressure (!) 79/65, pulse 83, temperature 97.9 F (36.6 C), resp. rate 13, height _0  (1.676 m), weight 131.7 kg, SpO2 100 %. CVP:  [12 mmHg-22 mmHg] 16 mmHg  Vent Mode: PRVC FiO2 (%):  [40 %-80 %] 40 % Set Rate:  [16 bmp] 16 bmp Vt Set:  [400 mL] 400 mL PEEP:  [8 cmH20-10 cmH20] 8 cmH20 Plateau Pressure:  [13 cmH20-23 cmH20] 13 cmH20   Intake/Output Summary (Last 24 hours) at 11/30/2021 0732 Last data filed at 11/30/2021 0700 Gross per 24 hour  Intake 16920.48 ml  Output 14730 ml  Net 2190.48 ml    Filed Weights   11/27/21 1628 11/28/21 0500 11/30/21 0500  Weight: 133.4 kg 131.5 kg 131.7 kg      REVIEW OF SYSTEMS  PATIENT IS UNABLE TO PROVIDE COMPLETE REVIEW OF SYSTEMS DUE TO SEVERE CRITICAL ILLNESS AND TOXIC METABOLIC ENCEPHALOPATHY    PHYSICAL EXAMINATION:  GENERAL:critically ill appearing, +resp distress EYES: Pupils equal, round, reactive to light.  No scleral icterus.  MOUTH: Moist mucosal membrane. INTUBATED NECK: Supple.  PULMONARY: +rhonchi, +wheezing CARDIOVASCULAR: S1 and S2.  No murmurs  GASTROINTESTINAL: Soft, nontender, -distended. Positive bowel sounds.  MUSCULOSKELETAL: Left foot amputation with bandage, RT foot +cyanosis NEUROLOGIC:  obtunded     Assessment & Plan:   36 yo morbidly obese AAF Admitted to inpatient as CODE STROKE with multiple acute/subacute infarcts  found on MRI,with brain damage and toxic encephalopathy with ESRD on PD, acute RLE ischemia &  osteomyelitis on L foot possible endocarditis with progressive resp failure   Severe ACUTE Hypoxic and Hypercapnic Respiratory Failure -continue Mechanical Ventilator support -Wean Fio2 and PEEP as tolerated -VAP/VENT bundle implementation - Wean PEEP & FiO2 as tolerated, maintain SpO2 > 88% - Head of bed elevated 30 degrees, VAP protocol in place - Plateau pressures less than 30 cm H20  - Intermittent chest x-ray & ABG PRN - Ensure adequate pulmonary hygiene  -will NOT perform SAT/SBT when respiratory parameters are met  SEPTIC shock SOURCE-COVID and RLE, LLE -use vasopressors to keep MAP>65 as needed -follow ABG and LA as needed -follow up cultures -emperic ABX -consider stress dose steroids -aggressive IV fluid Resuscitation   Multiple Acute/subacute infarcts  +brain damage - Neurology following, appreciate input - MRA, carotid US pending High risk for aspiration will  need [facility] ENT consulted    INFECTIOUS DISEASE LLL OSTEO COVID 19 infection -continue antibiotics as prescribed -follow up cultures -follow up ID consultation Will need TEE to assess for endocarditis   Acute hypoxemic respiratory failure due to COVID-19 pneumonia Mechanical ventilation via ARDS protocol, target PRVC 6 cc/kg Wean PEEP and FiO2 as able Goal plateau pressure less than 30, driving pressure less than 15 Paralytics if necessary for vent synchrony, gas exchange Cycle prone positioning if necessary for oxygenation Deep sedation per PAD protocol diuresis as tolerated based on Kidney function VAP prevention order set Follow  inflammatory markers as needed with CRP Vitamin C, zinc as indicated Plan to repeat and check resp cultures if needed    ACUTE  KIDNEY INJURY/Renal Failure END STAGE RENAL FAILURE On PD -continue Foley Catheter-assess need -Avoid nephrotoxic agents -Follow urine output, BMP -Ensure adequate renal perfusion, optimize oxygenation -Renal dose medications Will need CRRT  Intake/Output Summary (Last 24 hours) at 11/30/2021 0737 Last data filed at 11/30/2021 0700 Gross per 24 hour  Intake 16920.48 ml  Output 14730 ml  Net 2190.48 ml      Ischemic RLE in the setting of severe PAD - vascular surgery following, appreciate input - on heparin drip per pharmacy protocol  Best Practice (right click and "Reselect all SmartList Selections" daily)  Diet/type: NPO DVT prophylaxis: systemic heparin GI prophylaxis: PPI Lines: N/A Foley:  N/A Code Status:  full code Last date of multidisciplinary goals of care discussion [per primary service]  Labs   CBC: Recent Labs  Lab 11/28/2021 1320 12/14/2021 1412 11/27/21 0528 11/28/21 0602 11/29/21 0342 11/30/21 0430  WBC 17.2* 16.6* 14.4* 17.4* 23.3* 30.5*  NEUTROABS 14.0* 14.0* 11.8*  --   --   --   HGB 9.2* 9.4* 8.6* 8.6* 8.5* 8.8*  HCT 28.4* 29.6* 27.4* 27.4* 27.5* 28.9*  MCV 99.6 98.7 100.4* 100.4* 100.0 101.8*  PLT 280 247 242 253 274 267     Basic Metabolic Panel: Recent Labs  Lab 11/21/2021 1412 11/27/21 0528 11/28/21 0602 11/29/21 0342 11/30/21 0314  NA 140 138 136 134* 135  K 3.8 4.1 4.0 3.5 3.6  CL 94* 94* 93* 93* 95*  CO2 20* 17* 18* 15* 20*  GLUCOSE 122* 98 108* 152* 128*  BUN 65* 67* 67* 64* 56*  CREATININE 11.34* 12.01* 11.50* 11.44* 10.89*  CALCIUM 8.3* 8.2* 7.9* 7.8* 8.1*  MG  --   --  1.5* 1.6* 2.0  PHOS  --   --  10.0* 9.3* 10.8*    GFR: Estimated Creatinine Clearance: 10 mL/min (A) (by C-G formula based on SCr of 10.89 mg/dL (H)). Recent Labs  Lab 11/27/21 0528 11/28/21 0602 11/29/21 0342 11/30/21 0430  WBC 14.4* 17.4* 23.3* 30.5*     Liver Function Tests: Recent Labs  Lab 11/27/2021 1412 11/27/21 0528 11/28/21 0602  11/29/21 0342 11/30/21 0314  AST 11* 11* 11* 12* 10*  ALT _0 <5 5  ALKPHOS 131* 124 120 132* 129*  BILITOT 1.0 0.7 1.0 1.0 0.6  PROT 7.1 6.6 6.5 6.6 6.8  ALBUMIN 2.8* 2.7* 2.8* 2.7* 2.7*    No results for input(s): LIPASE, AMYLASE in the last 168 hours. No results for input(s): AMMONIA in the last 168 hours.  ABG    Component Value Date/Time   PHART 7.22 (L) 11/30/2021 0316   PCO2ART 44 11/30/2021 0316   PO2ART 99 11/30/2021 0316   HCO3 18.0 (L) 11/30/2021 0316   TCO2 26 09/08/2021 1418   ACIDBASEDEF 9.3 (H) 11/30/2021 0316   O2SAT 96.2 11/30/2021 0316      Coagulation Profile: Recent Labs  Lab 11/30/2021 2222  INR 1.3*     HbA1C: Hgb A1c MFr Bld  Date/Time Value Ref Range Status  11/21/2021 06:30 PM 5.1 4.8 - 5.6 % Final    Comment:    (NOTE) Pre diabetes:          5.7%-6.4%  Diabetes:              >6.4%  Glycemic control for   <7.0% adults with diabetes   11/24/2021 10:05  AM 5.0 4.8 - 5.6 % Final    Comment:    (NOTE) Pre diabetes:          5.7%-6.4%  Diabetes:              >6.4%  Glycemic control for   <7.0% adults with diabetes     CBG: Recent Labs  Lab 11/28/21 2338 11/29/21 1130 11/29/21 1849 11/30/21 0005 11/30/21 0448  GLUCAP 122* 111* 78 126* 121*     Home Medications  Prior to Admission medications   Medication Sig Start Date End Date Taking? Authorizing Provider  amLODipine (NORVASC) 10 MG tablet Take 10 mg by mouth daily as needed (elevated blood pressure).   Yes [provider]  aspirin EC 81 MG EC tablet Take 1 tablet (81 mg total) by mouth daily. 02/16/16  Yes Demetrios Loll, MD  atorvastatin (LIPITOR) 20 MG tablet Take 1 tablet (20 mg total) by mouth daily. 11/15/21  Yes Dew, Erskine Squibb, MD  carvedilol (COREG) 25 MG tablet Take 25 mg by mouth 2 (two) times daily as needed (elevated blood pressure).   Yes [provider]  clopidogrel (PLAVIX) 75 MG tablet Take 1 tablet (75 mg total) by mouth daily. 11/15/21  Yes  Dew, Erskine Squibb, MD  isosorbide mononitrate (IMDUR) 30 MG 24 hr tablet Take 30 mg by mouth 2 (two) times daily as needed (elevated blood pressure).   Yes [provider]  losartan (COZAAR) 100 MG tablet Take 100 mg by mouth daily as needed (elevated blood pressure).   Yes [provider]  cilostazol (PLETAL) 100 MG tablet Take 1 tablet (100 mg total) by mouth 2 (two) times daily before a meal. Patient not taking: Reported on 12/02/2021 05/07/21   Kris Hartmann, NP  lactulose (CHRONULAC) 10 GM/15ML solution Take 30 mLs (20 g total) by mouth 2 (two) times daily as needed for mild constipation. Patient not taking: Reported on 11/30/2021 07/09/21   Edwin Dada, MD  multivitamin (RENA-VIT) TABS tablet Take 1 tablet by mouth at bedtime. Patient not taking: Reported on 11/15/2021 09/18/19   Fritzi Mandes, MD  ondansetron (ZOFRAN) 4 MG tablet Take 1 tablet (4 mg total) by mouth every 6 (six) hours as needed for nausea. Patient not taking: Reported on 11/09/2021 09/08/21   Criselda Peaches, DPM      DVT/GI PRX  assessed I Assessed the need for Labs I Assessed the need for Foley I Assessed the need for Central Venous Line Family Discussion when available I Assessed the need for Mobilization I made an Assessment of medications to be adjusted accordingly Safety Risk assessment completed  CASE DISCUSSED IN MULTIDISCIPLINARY ROUNDS WITH ICU TEAM     Critical Care Time devoted to patient care services described in this note is 55 minutes.  Critical care was necessary to treat /prevent imminent and life-threatening deterioration. Overall, patient is critically ill, prognosis is guarded.  Patient with Multiorgan failure and at high risk for cardiac arrest and death.   PATIENT IS IN THE DYING PROCESS   Rockell Faulks Patricia Pesa, M.D.  [facility]  Medical Director [place] Director [facility] Cardio-Pulmonary Department

## 2021-12-01 ENCOUNTER — Inpatient Hospital Stay: Payer: Medicare Other | Admitting: Anesthesiology

## 2021-12-01 ENCOUNTER — Encounter: Admission: EM | Disposition: E | Payer: Self-pay | Source: Home / Self Care | Attending: Internal Medicine

## 2021-12-01 ENCOUNTER — Inpatient Hospital Stay: Payer: Medicare Other

## 2021-12-01 DIAGNOSIS — I059 Rheumatic mitral valve disease, unspecified: Secondary | ICD-10-CM | POA: Diagnosis not present

## 2021-12-01 DIAGNOSIS — I639 Cerebral infarction, unspecified: Secondary | ICD-10-CM | POA: Diagnosis not present

## 2021-12-01 DIAGNOSIS — I96 Gangrene, not elsewhere classified: Secondary | ICD-10-CM

## 2021-12-01 HISTORY — PX: AMPUTATION: SHX166

## 2021-12-01 LAB — RENAL FUNCTION PANEL
Albumin: 2.1 g/dL — ABNORMAL LOW (ref 3.5–5.0)
Anion gap: 22 — ABNORMAL HIGH (ref 5–15)
BUN: 53 mg/dL — ABNORMAL HIGH (ref 6–20)
CO2: 20 mmol/L — ABNORMAL LOW (ref 22–32)
Calcium: 8.3 mg/dL — ABNORMAL LOW (ref 8.9–10.3)
Chloride: 91 mmol/L — ABNORMAL LOW (ref 98–111)
Creatinine, Ser: 10.56 mg/dL — ABNORMAL HIGH (ref 0.44–1.00)
GFR, Estimated: 4 mL/min — ABNORMAL LOW (ref >60)
Glucose, Bld: 148 mg/dL — ABNORMAL HIGH (ref 70–99)
Phosphorus: 5.8 mg/dL — ABNORMAL HIGH (ref 2.5–4.6)
Potassium: 4.4 mmol/L (ref 3.5–5.1)
Sodium: 133 mmol/L — ABNORMAL LOW (ref 135–145)

## 2021-12-01 LAB — CBC
HCT: 30.1 % — ABNORMAL LOW (ref 36.0–46.0)
Hemoglobin: 9.2 g/dL — ABNORMAL LOW (ref 12.0–15.0)
MCH: 31.3 pg (ref 26.0–34.0)
MCHC: 30.6 g/dL (ref 30.0–36.0)
MCV: 102.4 fL — ABNORMAL HIGH (ref 80.0–100.0)
Platelets: 253 10*3/uL (ref 150–400)
RBC: 2.94 MIL/uL — ABNORMAL LOW (ref 3.87–5.11)
RDW: 18.2 % — ABNORMAL HIGH (ref 11.5–15.5)
WBC: 36.5 10*3/uL — ABNORMAL HIGH (ref 4.0–10.5)
nRBC: 2.1 % — ABNORMAL HIGH (ref 0.0–0.2)

## 2021-12-01 LAB — HEXAGONAL PHASE PHOSPHOLIPID: Hexagonal Phase Phospholipid: 3 s (ref 0–11)

## 2021-12-01 LAB — TROPONIN I (HIGH SENSITIVITY)
Troponin I (High Sensitivity): 269 ng/L (ref <18)
Troponin I (High Sensitivity): 269 ng/L (ref <18)

## 2021-12-01 LAB — GLUCOSE, CAPILLARY
Glucose-Capillary: 114 mg/dL — ABNORMAL HIGH (ref 70–99)
Glucose-Capillary: 123 mg/dL — ABNORMAL HIGH (ref 70–99)
Glucose-Capillary: 127 mg/dL — ABNORMAL HIGH (ref 70–99)
Glucose-Capillary: 133 mg/dL — ABNORMAL HIGH (ref 70–99)
Glucose-Capillary: 133 mg/dL — ABNORMAL HIGH (ref 70–99)
Glucose-Capillary: 138 mg/dL — ABNORMAL HIGH (ref 70–99)
Glucose-Capillary: 141 mg/dL — ABNORMAL HIGH (ref 70–99)
Glucose-Capillary: 151 mg/dL — ABNORMAL HIGH (ref 70–99)

## 2021-12-01 LAB — BLOOD GAS, ARTERIAL
Acid-base deficit: 7.7 mmol/L — ABNORMAL HIGH (ref 0.0–2.0)
Bicarbonate: 20 mmol/L (ref 20.0–28.0)
FIO2: 0.45 %
MECHVT: 400 mL
Mechanical Rate: 15
O2 Saturation: 99 %
PEEP: 5 cmH2O
Patient temperature: 36.4
RATE: 15 resp/min
pCO2 arterial: 49 mmHg — ABNORMAL HIGH (ref 32–48)
pH, Arterial: 7.22 — ABNORMAL LOW (ref 7.35–7.45)
pO2, Arterial: 113 mmHg — ABNORMAL HIGH (ref 83–108)

## 2021-12-01 LAB — COMPREHENSIVE METABOLIC PANEL
ALT: 6 U/L (ref 0–44)
AST: 20 U/L (ref 15–41)
Albumin: 2.5 g/dL — ABNORMAL LOW (ref 3.5–5.0)
Alkaline Phosphatase: 135 U/L — ABNORMAL HIGH (ref 38–126)
Anion gap: 21 — ABNORMAL HIGH (ref 5–15)
BUN: 55 mg/dL — ABNORMAL HIGH (ref 6–20)
CO2: 18 mmol/L — ABNORMAL LOW (ref 22–32)
Calcium: 8.1 mg/dL — ABNORMAL LOW (ref 8.9–10.3)
Chloride: 93 mmol/L — ABNORMAL LOW (ref 98–111)
Creatinine, Ser: 10.3 mg/dL — ABNORMAL HIGH (ref 0.44–1.00)
GFR, Estimated: 5 mL/min — ABNORMAL LOW (ref >60)
Glucose, Bld: 154 mg/dL — ABNORMAL HIGH (ref 70–99)
Potassium: 3.7 mmol/L (ref 3.5–5.1)
Sodium: 132 mmol/L — ABNORMAL LOW (ref 135–145)
Total Bilirubin: 0.9 mg/dL (ref 0.3–1.2)
Total Protein: 6.8 g/dL (ref 6.5–8.1)

## 2021-12-01 LAB — LUPUS ANTICOAGULANT PANEL
DRVVT: 49.8 s — ABNORMAL HIGH (ref 0.0–47.0)
PTT Lupus Anticoagulant: 46 s — ABNORMAL HIGH (ref 0.0–43.5)

## 2021-12-01 LAB — SEDIMENTATION RATE: Sed Rate: 88 mm/hr — ABNORMAL HIGH (ref 0–20)

## 2021-12-01 LAB — BLOOD GAS, VENOUS
Acid-base deficit: 8.5 mmol/L — ABNORMAL HIGH (ref 0.0–2.0)
Bicarbonate: 21.4 mmol/L (ref 20.0–28.0)
FIO2: 50 %
MECHVT: 400 mL
O2 Saturation: 73.1 %
PEEP: 5 cmH2O
Patient temperature: 37
RATE: 15 resp/min
pCO2, Ven: 63 mmHg — ABNORMAL HIGH (ref 44–60)
pH, Ven: 7.14 — CL (ref 7.25–7.43)
pO2, Ven: 51 mmHg — ABNORMAL HIGH (ref 32–45)

## 2021-12-01 LAB — HEMOGLOBIN AND HEMATOCRIT, BLOOD
HCT: 29.6 % — ABNORMAL LOW (ref 36.0–46.0)
Hemoglobin: 9.1 g/dL — ABNORMAL LOW (ref 12.0–15.0)

## 2021-12-01 LAB — FERRITIN: Ferritin: 870 ng/mL — ABNORMAL HIGH (ref 11–307)

## 2021-12-01 LAB — HEPARIN LEVEL (UNFRACTIONATED): Heparin Unfractionated: 0.24 IU/mL — ABNORMAL LOW (ref 0.30–0.70)

## 2021-12-01 LAB — DRVVT CONFIRM: dRVVT Confirm: 0.9 ratio (ref 0.8–1.2)

## 2021-12-01 LAB — C-REACTIVE PROTEIN: CRP: 26.6 mg/dL — ABNORMAL HIGH (ref <1.0)

## 2021-12-01 LAB — D-DIMER, QUANTITATIVE: D-Dimer, Quant: 2.63 ug/mL-FEU — ABNORMAL HIGH (ref 0.00–0.50)

## 2021-12-01 LAB — CK: Total CK: 913 U/L — ABNORMAL HIGH (ref 38–234)

## 2021-12-01 LAB — BRAIN NATRIURETIC PEPTIDE: B Natriuretic Peptide: 1566.6 pg/mL — ABNORMAL HIGH (ref 0.0–100.0)

## 2021-12-01 LAB — DRVVT MIX: dRVVT Mix: 43.5 s — ABNORMAL HIGH (ref 0.0–40.4)

## 2021-12-01 LAB — MAGNESIUM: Magnesium: 1.8 mg/dL (ref 1.7–2.4)

## 2021-12-01 LAB — PTT-LA MIX: PTT-LA Mix: 47.3 s — ABNORMAL HIGH (ref 0.0–40.5)

## 2021-12-01 LAB — LACTIC ACID, PLASMA
Lactic Acid, Venous: 2.9 mmol/L (ref 0.5–1.9)
Lactic Acid, Venous: 2.1 mmol/L (ref 0.5–1.9)

## 2021-12-01 IMAGING — DX DG CHEST 1V
1 series · 1 of 1 positions shown · non-contrast
Comparison: [DATE] at [DATE] a.m.

CLINICAL DATA: Central line placement

EXAM:
CHEST  1 VIEW

[chest ap]
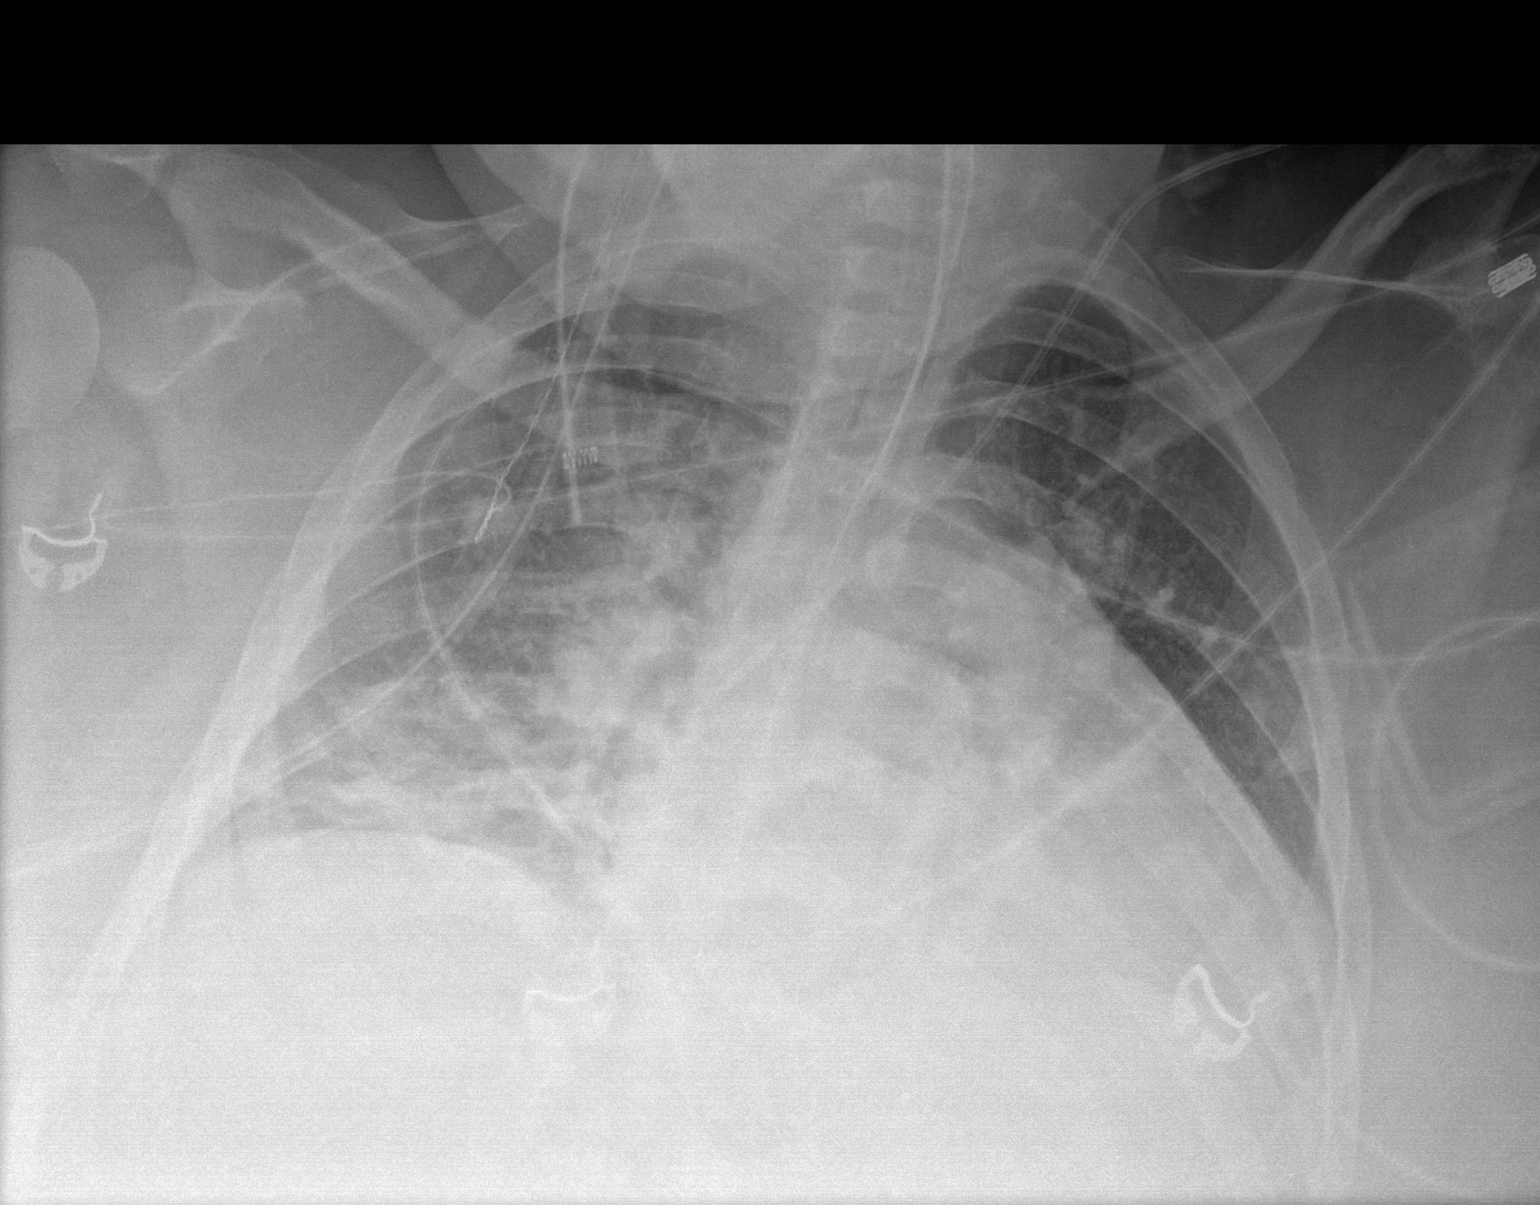

[1 of 1 positions shown; findings below may reference images not displayed]

FINDINGS: Endotracheal tube tip is somewhat indistinct but thought to be about
2.4 cm above the carina. Nasogastric tube indistinct but appears to
enter the stomach.

A left internal jugular central venous catheter is present tip
projecting over the SVC.

Low lung volumes are present, causing crowding of the pulmonary
vasculature. Stable indistinct airspace opacity at the lung bases,
right greater than left. The patient is rotated to the left on
today's radiograph, reducing diagnostic sensitivity and specificity.
IMPRESSION: 1. Left central line tip projects over the SVC. No visible
pneumothorax.
2. Stable bibasilar airspace opacities, right greater than left. Low
lung volumes.

## 2021-12-01 IMAGING — DX DG CHEST 1V
1 series · 1 of 1 positions shown · non-contrast
Comparison: [DATE]

CLINICAL DATA: Pneumothorax

EXAM:
CHEST  1 VIEW

[chest ap]
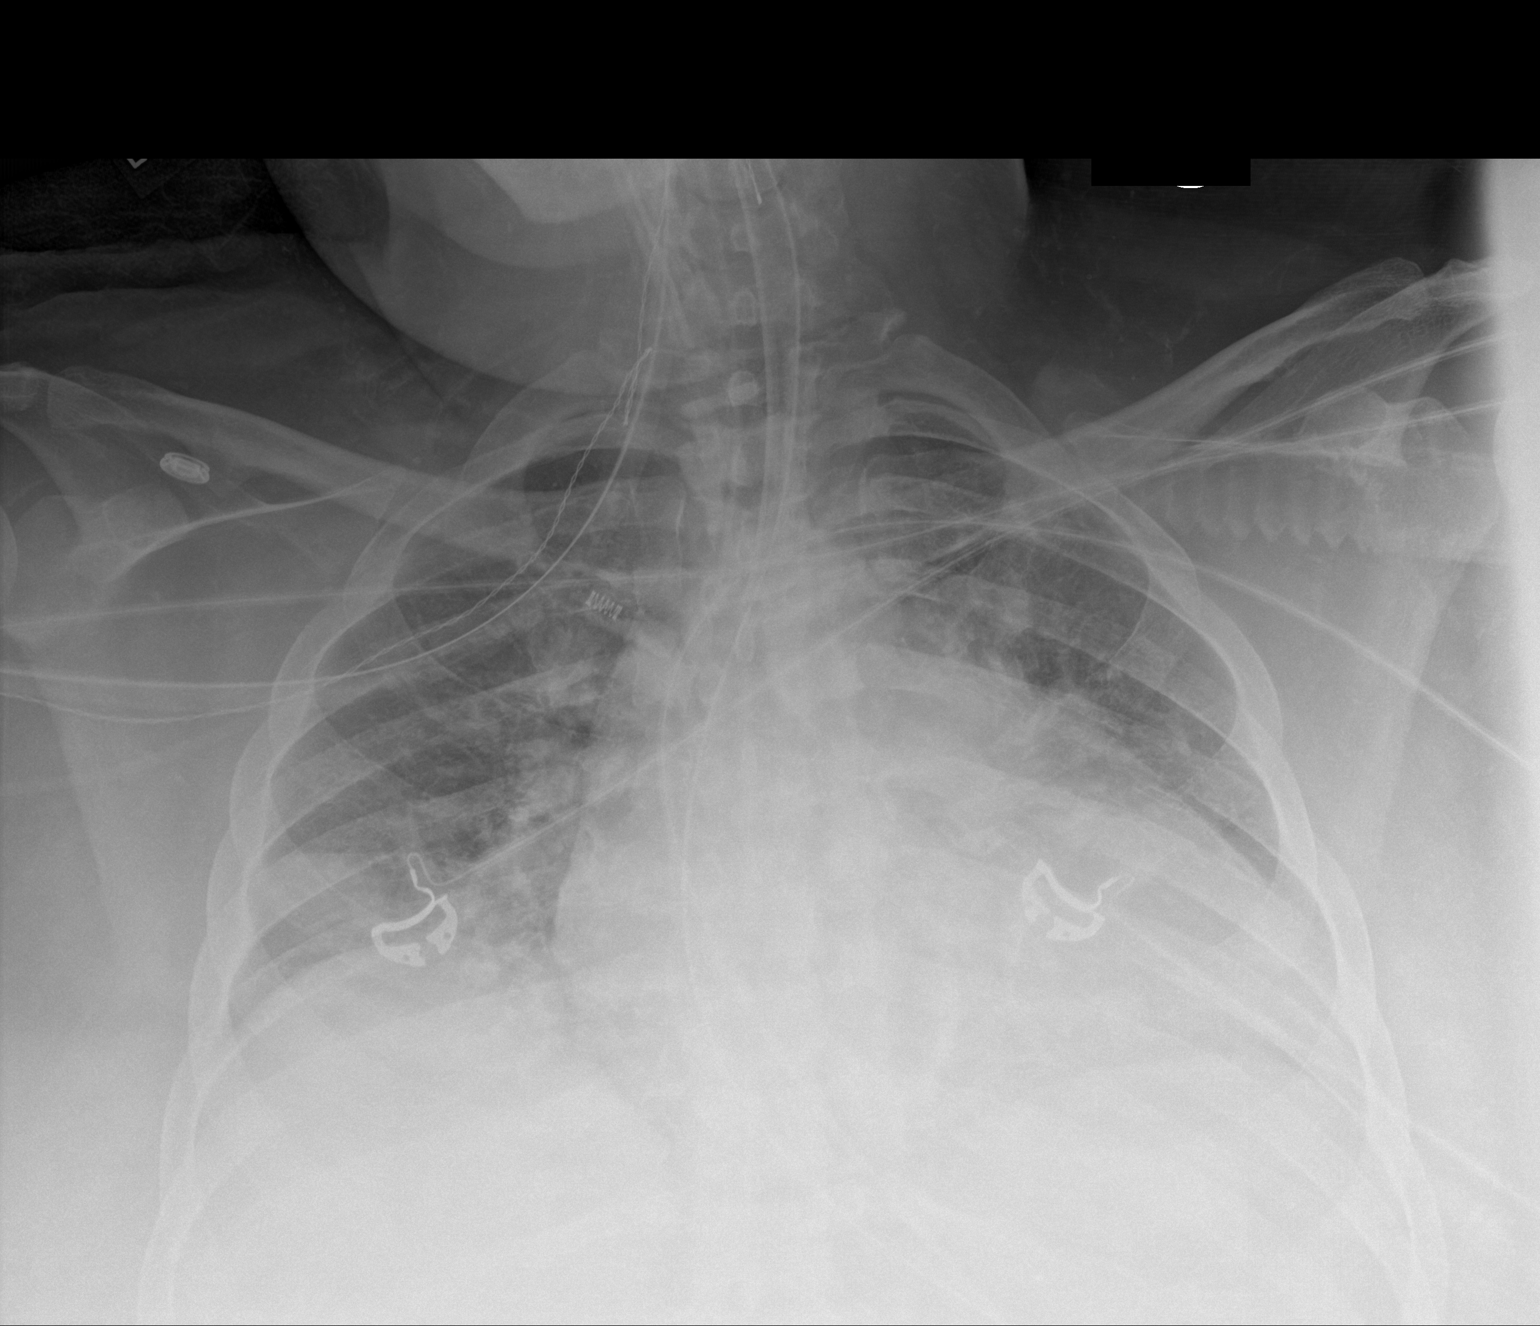

[1 of 1 positions shown; findings below may reference images not displayed]

FINDINGS: Endotracheal tube and NG tube remain in place unchanged.
Cardiomegaly with vascular congestion. Bilateral airspace opacities
slightly worsened since prior study, likely worsening edema.
Decreasing lung volumes. No effusions. No acute bony abnormality.
IMPRESSION: Cardiomegaly with mild vascular congestion and edema/CHF, slightly
worsening since prior study.

Low lung volumes.

## 2021-12-01 IMAGING — CT CT HEAD W/O CM
4 series · 15 of 47 positions shown, 17 images · non-contrast
Comparison: Multiple exams, including [DATE] and MRI brain from
[DATE]

CLINICAL DATA: Follow up stroke



[Series 2: head wo · axial · 0.42mm/px · z∈[-130,-20]mm · 7 of 30 slices shown, 9 images]
[im 4/30  brain]
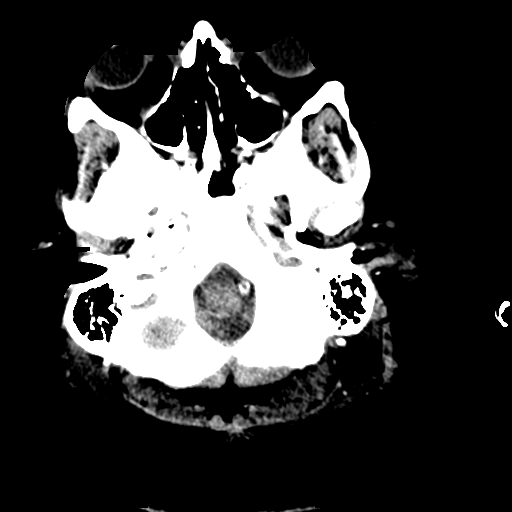
[im 4/30  bone]
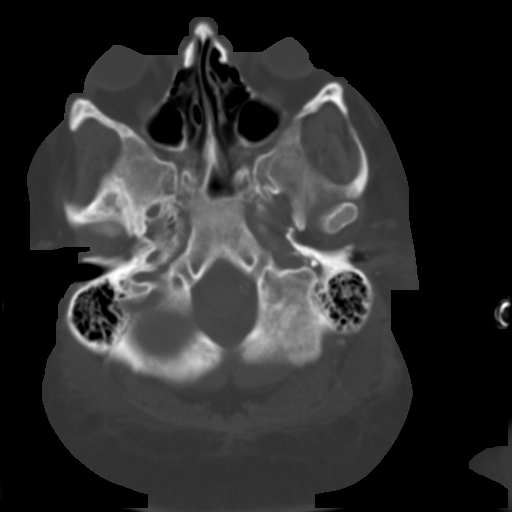
[im 8/30  brain]
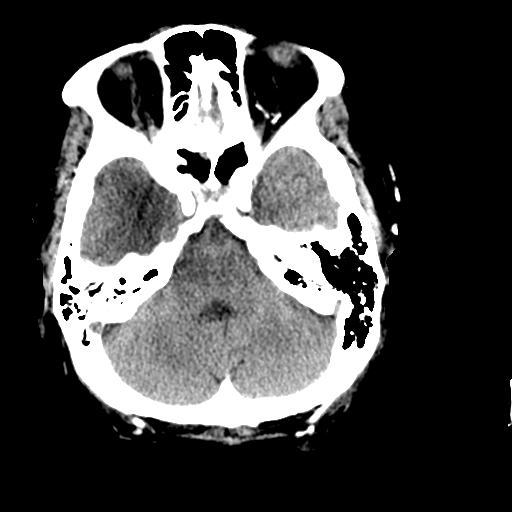
[im 11/30  brain]
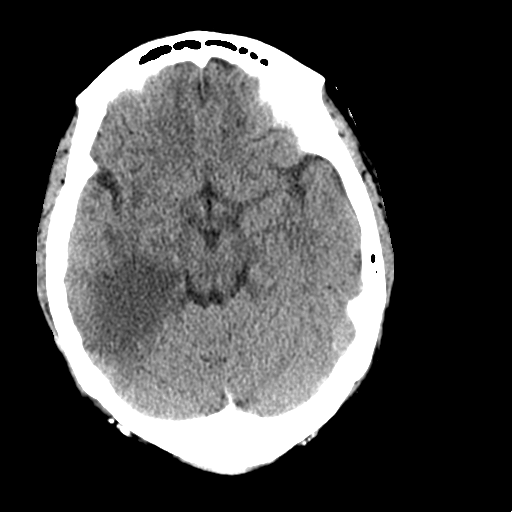
[im 15/30  brain]
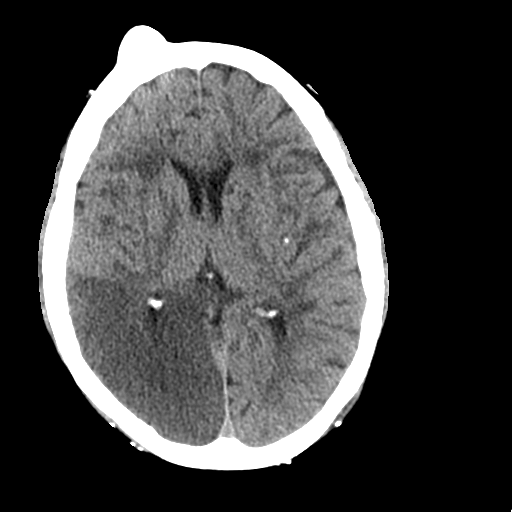
[im 19/30  brain]
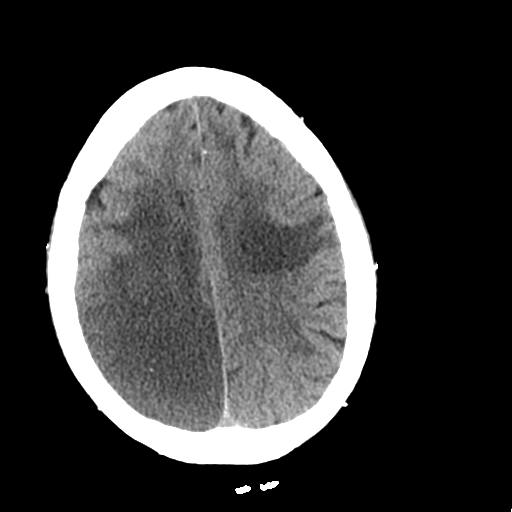
[im 19/30  bone]
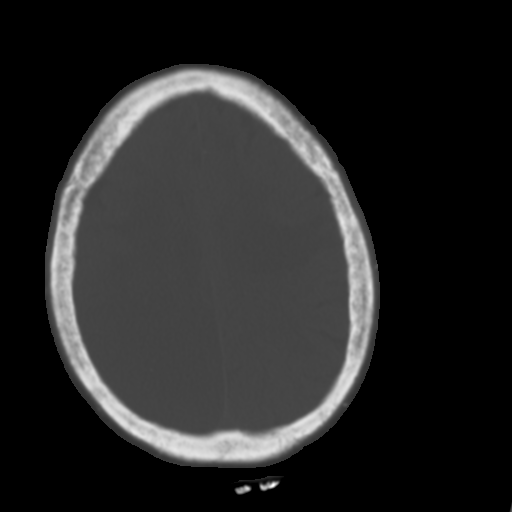
[im 22/30  brain]
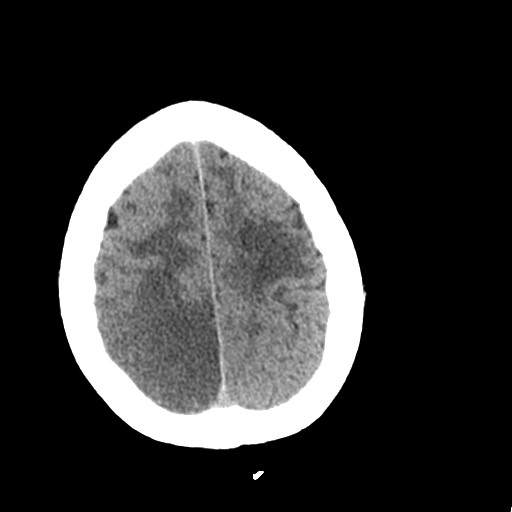
[im 26/30  brain]
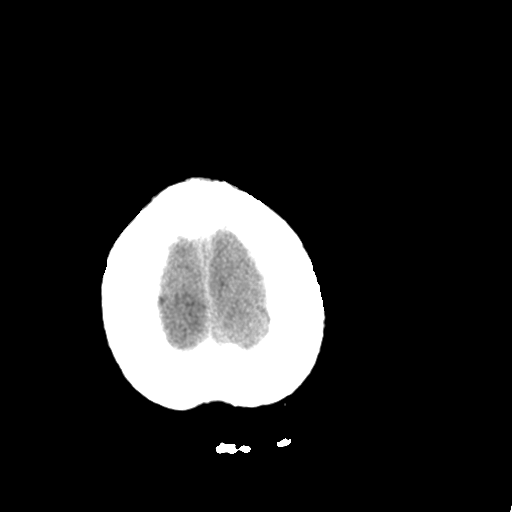

[Series 3: head bone · axial · 0.42mm/px · z∈[-131,-117]mm · 2 of 75 slices shown]
[im 8/75  bone]
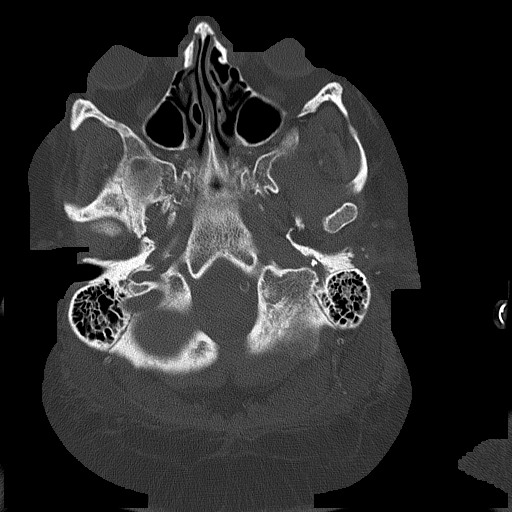
[im 15/75  bone]
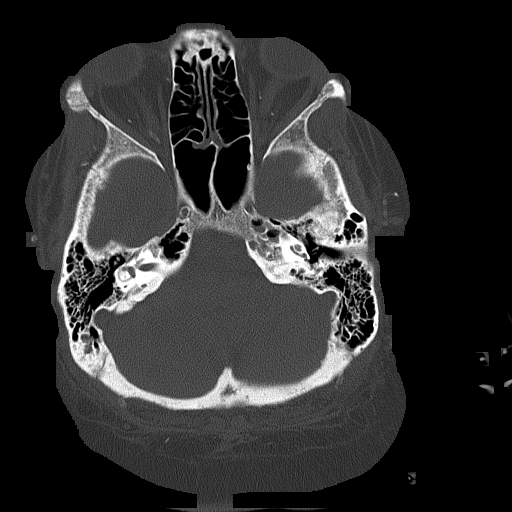

[Series 4: coronal soft tissue · coronal · 0.31mm/px · 3 of 73 slices shown]
[im 25/73  brain]
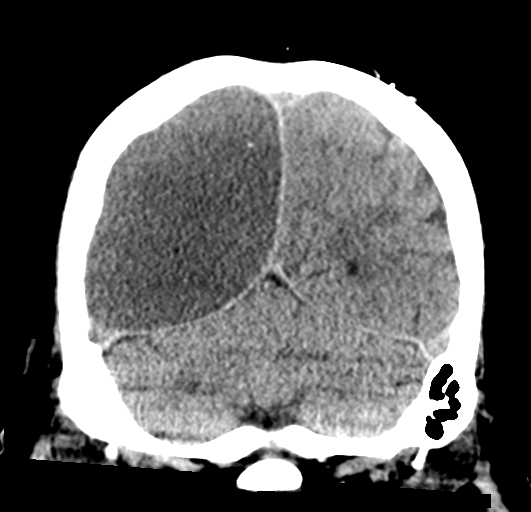
[im 33/73  brain]
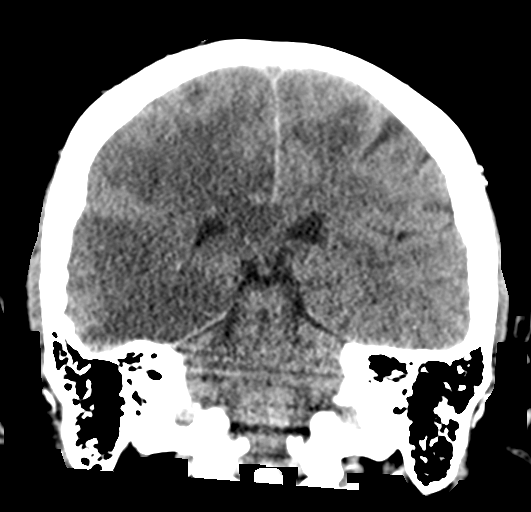
[im 41/73  brain]
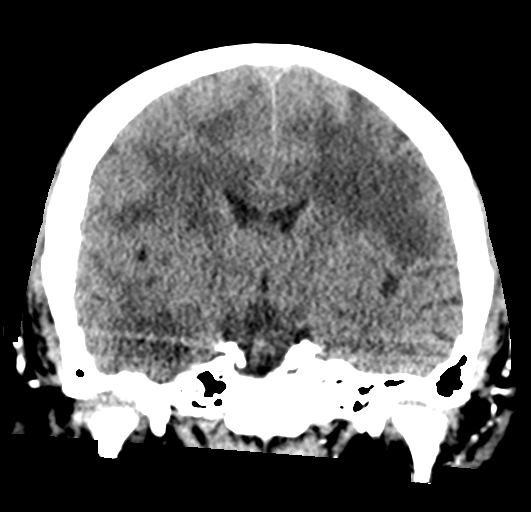

[Series 5: sagittal soft tissue · sagittal · 0.31mm/px · 3 of 56 slices shown]
[im 19/56  brain]
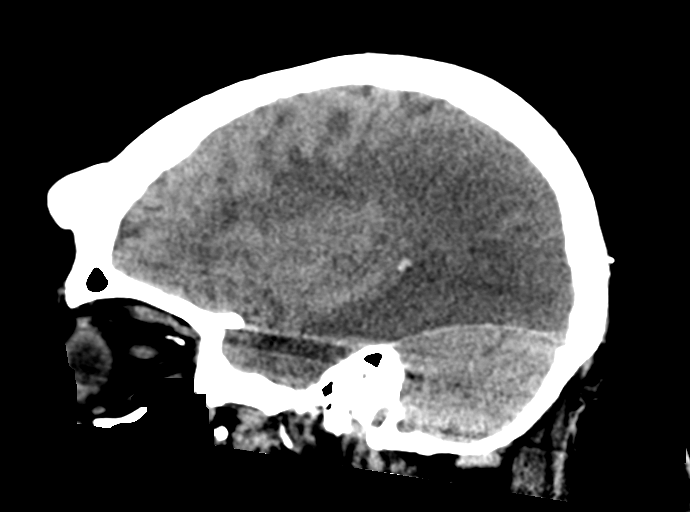
[im 28/56  brain]
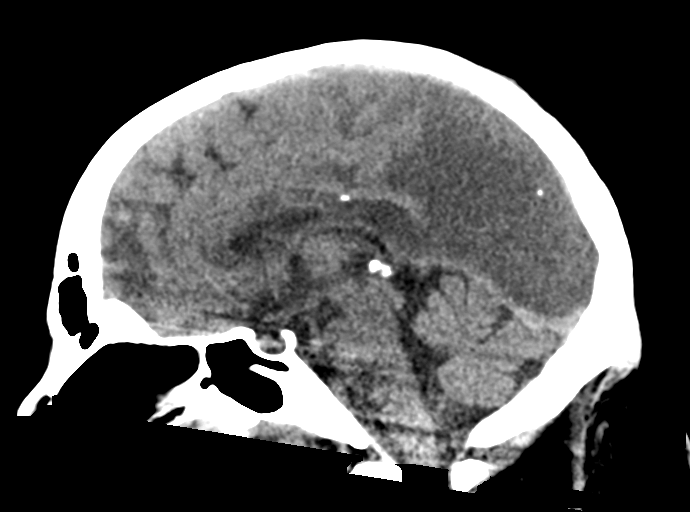
[im 37/56  brain]
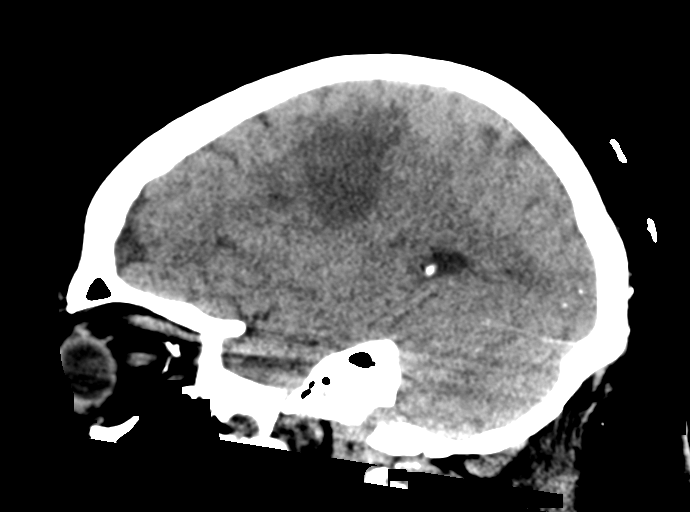

[15 of 47 positions shown; findings below may reference images not displayed]

FINDINGS: Brain: Abnormal hypodensity and loss of gray-white differentiation
throughout the right occipital lobe, a substantial portion of the
right temporal lobe, and much of the right parietal lobe compatible
with a large distribution infarct involving PCA and posterior MCA
distribution on the right, substantially larger than the small
perfusion defects seen on the right side on the MRI from [DATE].

There is also substantial vasogenic edema potentially some cytotoxic
edema posteriorly in the left frontal lobe, substantially
disproportionate to the small infarcts shown on the [DATE] exam
likewise suspicious for progressive infarcts. No midline shift.

New patchy white matter hypodensities in both frontal lobes have
substantially increased from [DATE] and are concerning for
potential white matter infarcts or associated vasogenic edema. This
includes hypodensities along the internal capsules and
periventricular white matter. Strictly speaking in the context of
the patient's lower extremity ischemia, infection is not completely
excluded although ischemia seems more likely given the other
findings intracranially. No large amount of intracranial hemorrhage.
No hydrocephalus or effacement of the basilar cisterns.

Vascular: No hyperdense MCA. Widespread age advanced atherosclerotic
vascular calcifications as can be encountered in the setting of
diabetes.

Skull: Right frontal osteoma noted. Patchy demineralization in the
calvarium is unchanged.

Sinuses/Orbits: Unremarkable

Other: No supplemental non-categorized findings.
IMPRESSION: 1. Large subacute infarct involving the right temporal, parietal,
and occipital lobes. Indistinct subacute infarct in the left central
MCA distribution, posteriorly in the left frontal lobe. Patchy new
white matter hypodensities along the internal capsules and
periventricular white matter concerning for additional ischemic
lesions in this clinical context. Overall this represents a marked
worsening/progression compared to prior images of [DATE] and
[DATE]. No current overt intracranial hematoma identified. No
current hydrocephalus or effacement of the basilar cisterns.

These results were called by telephone at the time of interpretation
on [DATE] at [DATE] to provider JACQUELIN , who verbally
acknowledged these results.

## 2021-12-01 SURGERY — AMPUTATION BELOW KNEE
Anesthesia: General | Site: Ankle | Laterality: Bilateral

## 2021-12-01 MED ORDER — FREE WATER
30.0000 mL | Status: DC
  Administered 2021-12-01 – 2021-12-03 (×11): 30 mL

## 2021-12-01 MED ORDER — CEFAZOLIN SODIUM-DEXTROSE 2-4 GM/100ML-% IV SOLN: 2.0000 g | INTRAVENOUS | Status: DC

## 2021-12-01 MED ORDER — VASOPRESSIN 20 UNITS/100 ML INFUSION FOR SHOCK
0.0000 [IU]/min | INTRAVENOUS | Status: DC
  Administered 2021-12-01 – 2021-12-04 (×9): 0.03 [IU]/min via INTRAVENOUS
  Filled 2021-12-01 (×10): qty 100

## 2021-12-01 MED ORDER — VECURONIUM BROMIDE 10 MG IV SOLR
10.0000 mg | Freq: Once | INTRAVENOUS | Status: AC
  Administered 2021-12-01: 10 mg via INTRAVENOUS
  Filled 2021-12-01: qty 10

## 2021-12-01 MED ORDER — MAGNESIUM SULFATE 2 GM/50ML IV SOLN
2.0000 g | Freq: Once | INTRAVENOUS | Status: AC
  Administered 2021-12-01: 2 g via INTRAVENOUS
  Filled 2021-12-01: qty 50

## 2021-12-01 MED ORDER — MAGNESIUM SULFATE 2 GM/50ML IV SOLN: 2.0000 g | Freq: Once | INTRAVENOUS | Status: DC

## 2021-12-01 MED ORDER — SODIUM CHLORIDE 0.9 % IV SOLN
2.0000 g | Freq: Two times a day (BID) | INTRAVENOUS | Status: DC
  Administered 2021-12-01 – 2021-12-04 (×6): 2 g via INTRAVENOUS
  Filled 2021-12-01 (×8): qty 2

## 2021-12-01 MED ORDER — FENTANYL CITRATE (PF) 100 MCG/2ML IJ SOLN
INTRAMUSCULAR | Status: AC
  Filled 2021-12-01: qty 2

## 2021-12-01 MED ORDER — PHENYLEPHRINE CONCENTRATED 100MG/250ML (0.4 MG/ML) INFUSION SIMPLE
0.0000 ug/min | INTRAVENOUS | Status: DC
  Filled 2021-12-01: qty 250

## 2021-12-01 MED ORDER — ROCURONIUM BROMIDE 100 MG/10ML IV SOLN
INTRAVENOUS | Status: DC | PRN
  Administered 2021-12-01: 50 mg via INTRAVENOUS

## 2021-12-01 MED ORDER — VANCOMYCIN HCL 1500 MG/300ML IV SOLN
1500.0000 mg | INTRAVENOUS | Status: DC
  Administered 2021-12-02 – 2021-12-03 (×2): 1500 mg via INTRAVENOUS
  Filled 2021-12-01 (×3): qty 300

## 2021-12-01 MED ORDER — SODIUM CHLORIDE 0.9 % FOR CRRT
INTRAVENOUS_CENTRAL | Status: DC | PRN
  Filled 2021-12-01: qty 1000

## 2021-12-01 MED ORDER — SODIUM CHLORIDE 0.9 % IV SOLN: INTRAVENOUS | Status: DC | PRN

## 2021-12-01 MED ORDER — FENTANYL CITRATE (PF) 100 MCG/2ML IJ SOLN
INTRAMUSCULAR | Status: DC | PRN
Start: 2021-12-01 — End: 2021-12-01
  Administered 2021-12-01 (×2): 50 ug via INTRAVENOUS

## 2021-12-01 MED ORDER — HYDROCORTISONE SOD SUC (PF) 100 MG IJ SOLR
100.0000 mg | Freq: Three times a day (TID) | INTRAMUSCULAR | Status: DC
  Administered 2021-12-01 – 2021-12-04 (×10): 100 mg via INTRAVENOUS
  Filled 2021-12-01 (×10): qty 2

## 2021-12-01 MED ORDER — SODIUM CHLORIDE 0.9 % IV SOLN: INTRAVENOUS | Status: DC

## 2021-12-01 MED ORDER — INSULIN ASPART 100 UNIT/ML IJ SOLN
0.0000 [IU] | INTRAMUSCULAR | Status: DC
  Administered 2021-12-02 – 2021-12-04 (×11): 1 [IU] via SUBCUTANEOUS
  Filled 2021-12-01 (×9): qty 1

## 2021-12-01 MED ORDER — VASOPRESSIN 20 UNIT/ML IV SOLN
INTRAVENOUS | Status: AC
  Filled 2021-12-01: qty 1

## 2021-12-01 MED ORDER — HEPARIN SODIUM (PORCINE) 1000 UNIT/ML IJ SOLN
1000.0000 [IU] | INTRAMUSCULAR | Status: DC | PRN
  Administered 2021-12-03: 3600 [IU]
  Filled 2021-12-01 (×2): qty 3
  Filled 2021-12-01: qty 1

## 2021-12-01 MED ORDER — PRISMASOL BGK 4/2.5 32-4-2.5 MEQ/L EC SOLN: Status: DC

## 2021-12-01 MED ORDER — PRISMASOL BGK 4/2.5 32-4-2.5 MEQ/L REPLACEMENT SOLN
Status: DC
  Filled 2021-12-01 (×6): qty 5000

## 2021-12-01 MED ORDER — DEXMEDETOMIDINE (PRECEDEX) IN NS 20 MCG/5ML (4 MCG/ML) IV SYRINGE
PREFILLED_SYRINGE | INTRAVENOUS | Status: DC | PRN
  Administered 2021-12-01: 12 ug via INTRAVENOUS

## 2021-12-01 MED ORDER — PHENYLEPHRINE HCL-NACL 20-0.9 MG/250ML-% IV SOLN
0.0000 ug/min | INTRAVENOUS | Status: DC
  Administered 2021-12-01: 140 ug/min via INTRAVENOUS
  Administered 2021-12-01: 130 ug/min via INTRAVENOUS
  Administered 2021-12-01: 20 ug/min via INTRAVENOUS
  Administered 2021-12-01: 150 ug/min via INTRAVENOUS
  Filled 2021-12-01 (×4): qty 250

## 2021-12-01 MED ORDER — SENNOSIDES 8.8 MG/5ML PO SYRP
5.0000 mL | ORAL_SOLUTION | Freq: Two times a day (BID) | ORAL | Status: DC
  Administered 2021-12-01 – 2021-12-03 (×4): 5 mL
  Filled 2021-12-01 (×8): qty 5

## 2021-12-01 MED ORDER — STERILE WATER FOR INJECTION IJ SOLN
INTRAMUSCULAR | Status: AC
  Filled 2021-12-01: qty 10

## 2021-12-01 SURGICAL SUPPLY — 44 items
APL PRP STRL LF DISP 70% ISPRP (MISCELLANEOUS) ×2
BLADE SAGITTAL WIDE XTHICK NO (BLADE) ×1 IMPLANT
BLADE SAW SAG 25.4X90 (BLADE) ×2 IMPLANT
BNDG CMPR STD VLCR NS LF 5.8X6 (GAUZE/BANDAGES/DRESSINGS)
BNDG COHESIVE 4X5 TAN ST LF (GAUZE/BANDAGES/DRESSINGS) ×3 IMPLANT
BNDG ELASTIC 6X5.8 VLCR NS LF (GAUZE/BANDAGES/DRESSINGS) ×1 IMPLANT
BNDG GAUZE ELAST 4 BULKY (GAUZE/BANDAGES/DRESSINGS) ×2 IMPLANT
BRUSH SCRUB EZ  4% CHG (MISCELLANEOUS)
BRUSH SCRUB EZ 4% CHG (MISCELLANEOUS) ×1 IMPLANT
CANISTER WOUND CARE 500ML ATS (WOUND CARE) ×1 IMPLANT
CHLORAPREP W/TINT 26 (MISCELLANEOUS) ×3 IMPLANT
DRAPE BILAT LIMB 76X120 89291 (MISCELLANEOUS) ×1 IMPLANT
DRAPE INCISE IOBAN 66X45 STRL (DRAPES) ×4 IMPLANT
DRSG VAC ATS LRG SENSATRAC (GAUZE/BANDAGES/DRESSINGS) ×2 IMPLANT
ELECT CAUTERY BLADE 6.4 (BLADE) ×2 IMPLANT
ELECT REM PT RETURN 9FT ADLT (ELECTROSURGICAL) ×2
ELECTRODE REM PT RTRN 9FT ADLT (ELECTROSURGICAL) ×1 IMPLANT
GAUZE XEROFORM 1X8 LF (GAUZE/BANDAGES/DRESSINGS) ×2 IMPLANT
GLOVE SURG SYN 7.0 (GLOVE) ×4 IMPLANT
GLOVE SURG SYN 7.0 PF PI (GLOVE) ×1 IMPLANT
GOWN STRL REUS W/ TWL LRG LVL3 (GOWN DISPOSABLE) ×1 IMPLANT
GOWN STRL REUS W/ TWL XL LVL3 (GOWN DISPOSABLE) ×1 IMPLANT
GOWN STRL REUS W/TWL LRG LVL3 (GOWN DISPOSABLE) ×2
GOWN STRL REUS W/TWL XL LVL3 (GOWN DISPOSABLE) ×2
HANDLE YANKAUER SUCT BULB TIP (MISCELLANEOUS) ×2 IMPLANT
KIT TURNOVER KIT A (KITS) ×2 IMPLANT
LABEL OR SOLS (LABEL) ×2 IMPLANT
MANIFOLD NEPTUNE II (INSTRUMENTS) ×2 IMPLANT
NS IRRIG 1000ML POUR BTL (IV SOLUTION) ×2 IMPLANT
PACK EXTREMITY ARMC (MISCELLANEOUS) ×2 IMPLANT
PAD ABD DERMACEA PRESS 5X9 (GAUZE/BANDAGES/DRESSINGS) ×2 IMPLANT
PAD NEG PRESSURE SENSATRAC (MISCELLANEOUS) ×1 IMPLANT
PAD PREP 24X41 OB/GYN DISP (PERSONAL CARE ITEMS) ×1 IMPLANT
SPONGE T-LAP 18X18 ~~LOC~~+RFID (SPONGE) ×2 IMPLANT
STAPLER SKIN PROX 35W (STAPLE) ×2 IMPLANT
STOCKINETTE M/LG 89821 (MISCELLANEOUS) ×3 IMPLANT
SUT SILK 2 0 (SUTURE) ×2
SUT SILK 2 0 SH (SUTURE) ×4 IMPLANT
SUT SILK 2-0 18XBRD TIE 12 (SUTURE) ×1 IMPLANT
SUT SILK 3 0 (SUTURE) ×2
SUT SILK 3-0 18XBRD TIE 12 (SUTURE) ×1 IMPLANT
SUT VIC AB 0 CT1 36 (SUTURE) ×4 IMPLANT
SUT VIC AB 2-0 CT1 (SUTURE) ×4 IMPLANT
WATER STERILE IRR 500ML POUR (IV SOLUTION) ×1 IMPLANT

## 2021-12-01 NOTE — Progress Notes (Signed)
Vent check not done due to pt in Surgery

## 2021-12-01 NOTE — Progress Notes (Incomplete)
Date of Admission:  12/12/2021   Total days of antibiotics ***        Day ***        Day ***        Day ***   ID: [name] is a 36 y.o. female with  *** Principal Problem:   Acute ischemic stroke ([place]) Active Problems:   Anemia in chronic kidney disease   Iron deficiency anemia   Chronic diastolic CHF (congestive heart failure) (HCC)   Obesity, Class III, BMI 40-49.9 (morbid obesity) ([place])   ESRD (end stage renal disease) on dialysis [facility])   Essential hypertension   Atherosclerosis of native arteries of the extremities with ulceration (HCC)   Ischemic foot   COVID-19 virus infection   Acute metabolic encephalopathy   Hyperphosphatemia   Metabolic acidosis   Likely osteomyelitis ([place])   Cerebrovascular 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 (HCC)    Subjective: ***  Medications:   vitamin C  500 mg Per Tube BID   chlorhexidine gluconate (MEDLINE KIT)  15 mL Mouth Rinse BID   Chlorhexidine Gluconate Cloth  6 each Topical Daily   collagenase   Topical Daily   docusate  100 mg Per Tube BID   feeding supplement (PROSource TF)  45 mL Per Tube TID   feeding supplement (VITAL AF 1.2 CAL)  1,000 mL Per Tube Q24H   free water  30 mL Per Tube Q4H   gentamicin cream  1 application Topical Daily   hydrocortisone sod succinate (SOLU-CORTEF) inj  100 mg Intravenous Q8H   insulin aspart  0-6 Units Subcutaneous Q4H   mouth rinse  15 mL Mouth Rinse 10 times per day   multivitamin  1 tablet Per Tube QHS   pantoprazole (PROTONIX) IV  40 mg Intravenous Q24H   polyethylene glycol  17 g Per Tube Daily   sennosides  5 mL Per Tube BID    Objective: Vital signs in last 24 hours: Temp:  [96.4 F (35.8 C)-99.3 F (37.4 C)] 96.8 F (36 C) (02/15 0835) Pulse Rate:  [83-101] 88 (02/15 0835) Resp:  [7-28] 17 (02/15 0835) SpO2:  [88 %-100 %] 97 % (02/15 0835) Arterial Line BP: (80-120)/(41-63) 104/48 (02/15 0835) FiO2 (%):  [30 %-45 %] 40 % (02/15 0835) Weight:   [131.7 kg] 131.7 kg (02/14 1809)  PHYSICAL EXAM:  General: Alert, cooperative, no distress, appears stated age.  Head: Normocephalic, without obvious abnormality, atraumatic. Eyes: Conjunctivae clear, anicteric sclerae. Pupils are equal ENT Nares normal. No drainage or sinus tenderness. Lips, mucosa, and tongue normal. No Thrush Neck: Supple, symmetrical, no adenopathy, thyroid: non tender no carotid bruit and no JVD. Back: No CVA tenderness. Lungs: Clear to auscultation bilaterally. No Wheezing or Rhonchi. No rales. Heart: Regular rate and rhythm, no murmur, rub or gallop. Abdomen: Soft, non-tender,not distended. Bowel sounds normal. No masses Extremities: atraumatic, no cyanosis. No edema. No clubbing Skin: No rashes or lesions. Or bruising Lymph: Cervical, supraclavicular normal. Neurologic: Grossly non-focal  Lab Results Recent Labs    11/30/21 0314 11/30/21 0430 11/24/2021 0338  WBC  --  30.5* 36.5*  HGB  --  8.8* 9.2*  HCT  --  28.9* 30.1*  NA 135  --  132*  K 3.6  --  3.7  CL 95*  --  93*  CO2 20*  --  18*  BUN 56*  --  55*  CREATININE 10.89*  --  10.30*   Liver Panel Recent Labs  11/30/21 0314 12/14/2021 0338  PROT 6.8 6.8  ALBUMIN 2.7* 2.5*  AST 10* 20  ALT 5 6  ALKPHOS 129* 135*  BILITOT 0.6 0.9   Sedimentation Rate No results for input(s): ESRSEDRATE in the last 72 hours. C-Reactive Protein Recent Labs    11/30/21 0315 11/27/2021 0338  CRP 16.2* 26.6*    Microbiology:  Studies/Results: DG Chest 1 View  Result Date: 12/08/2021 CLINICAL DATA:  Pneumothorax EXAM: CHEST  1 VIEW COMPARISON:  11/30/2021 FINDINGS: Endotracheal tube and NG tube remain in place unchanged. Cardiomegaly with vascular congestion. Bilateral airspace opacities slightly worsened since prior study, likely worsening edema. Decreasing lung volumes. No effusions. No acute bony abnormality. IMPRESSION: Cardiomegaly with mild vascular congestion and edema/CHF, slightly worsening since  prior study. Low lung volumes. Electronically Signed   By: Rolm Baptise M.D.   On: 11/18/2021 03:55   DG Chest Port 1 View  Result Date: 11/30/2021 CLINICAL DATA:  Acute respiratory failure with hypoxia EXAM: PORTABLE CHEST 1 VIEW COMPARISON:  Chest radiograph from one day prior. FINDINGS: Endotracheal tube tip is 2.0 cm above the carina. Enteric tube enters stomach with the tip not seen on this image. Stable cardiomediastinal silhouette with mild to moderate cardiomegaly. No pneumothorax. No pleural effusion. Low lung volumes. Mild pulmonary edema, improved. IMPRESSION: 1. Well-positioned support structures. 2. Mild congestive heart failure, improved. 3. Low lung volumes. Electronically Signed   By: Ilona Sorrel M.D.   On: 11/30/2021 08:08   Portable Chest x-ray  Result Date: 11/29/2021 CLINICAL DATA:  Intubated EXAM: PORTABLE CHEST 1 VIEW COMPARISON:  11/27/2021 FINDINGS: Initial film is rotated to the left which was repeated. Endotracheal tube approximately 2.5 cm above the carina. NG tube within the stomach. Heart is enlarged with vascular congestion and diffuse interstitial prominence suggesting component of mild interstitial edema. Difficult to exclude small posterior layering effusions. Associated bibasilar atelectasis. No large pneumothorax. IMPRESSION: Limited exam but endotracheal tube approximately 2.5 cm above the carina. Cardiomegaly with mild interstitial edema pattern and basilar atelectasis. Electronically Signed   By: Jerilynn Mages.  Shick M.D.   On: 11/29/2021 12:08     Assessment/Plan: ***

## 2021-12-01 NOTE — Progress Notes (Signed)
GOALS OF CARE DISCUSSION  The Clinical status was relayed to family in detail- Met with Mother Cleda Clarks and Mardene Celeste, Dr Candiss Norse and Hinton Dyer NP  Updated and notified of patients medical condition- Patient remains unresponsive and will not open eyes to command.   Patient is having a weak cough and struggling to remove secretions.   Patient with increased WOB and using accessory muscles to breathe Explained to family course of therapy and the modalities   Severe multiorgan failure with severe strokes, patient in dying process   Patient with Progressive multiorgan failure with a very high probablity of a very minimal chance of meaningful recovery despite all aggressive and optimal medical therapy.   PATIENT REMAINS FULL CODE  Family understands the situation. Family are satisfied with Plan of action and management. All questions answered  Additional CC time 35 mins   Kalesha Irving Patricia Pesa, M.D.  [facility]  Medical Director [place] Director [facility] Cardio-Pulmonary Department

## 2021-12-01 NOTE — Anesthesia Preprocedure Evaluation (Signed)
Anesthesia Evaluation  Patient identified by MRN, date of birth, ID band Patient awake and Patient unresponsive    Reviewed: Allergy & Precautions, NPO status , Patient's Chart, lab work & pertinent test results, Unable to perform ROS - Chart review onlyPreop documentation limited or incomplete due to emergent nature of procedure.  Airway Mallampati: Intubated  TM Distance: >3 FB Neck ROM: full   Comment: Patient intubated Dental  (+) Chipped Patient intubated:   Pulmonary neg pulmonary ROS, shortness of breath and with exertion, asthma , former smoker,    Pulmonary exam normal  + decreased breath sounds      Cardiovascular Exercise Tolerance: Poor hypertension, Pt. on medications + Peripheral Vascular Disease and +CHF  negative cardio ROS Normal cardiovascular exam Rate:Tachycardia     Neuro/Psych CVA negative neurological ROS  negative psych ROS   GI/Hepatic negative GI ROS, Neg liver ROS,   Endo/Other  negative endocrine ROSdiabetes, Poorly Controlled, Type 1  Renal/GU DialysisRenal disease     Musculoskeletal negative musculoskeletal ROS (+)   Abdominal (+) + obese,   Peds  Hematology negative hematology ROS (+) Blood dyscrasia, anemia ,   Anesthesia Other Findings Past Medical History: No date: Anemia of chronic disease No date: Asthma     Comment:  well controlled-no inhaler 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 No date: CHF (congestive heart failure) (HCC) No date: CKD (chronic kidney disease), stage III (HCC) No date: Diabetes ([place])     Comment:  type 2-diet controlled No date: Dyspnea No date: Hypertension No date: Morbid obesity ([place]) No date: Nephrotic syndrome No date: PAD (peripheral artery disease) ([place]) 06/2021: Sepsis [facility])  Past Surgical History: 10/29/2020: A/V FISTULAGRAM; Left     Comment:  Procedure: A/V FISTULAGRAM;  Surgeon: Algernon Huxley, MD;               Location: [place] [facility];  Service:  Cardiovascular;              Laterality: Left; 88/50/2774: APPLICATION OF WOUND VAC; Bilateral     Comment:  Procedure: APPLICATION OF WOUND VAC;  Surgeon: Benjamine Sprague, DO;  Location: [facility];  Service: General;                Laterality: Bilateral; 07/09/2020: AV FISTULA PLACEMENT; Left     Comment:  Procedure: ARTERIOVENOUS (AV) FISTULA CREATION (               BRACHIAL CEPHALIC);  Surgeon: Algernon Huxley, MD;                Location: [facility];  Service: Vascular;  Laterality:               Left; 02/28/2019: DIALYSIS/PERMA CATHETER INSERTION; N/A     Comment:  Procedure: DIALYSIS/PERMA CATHETER INSERTION;  Surgeon:               Algernon Huxley, MD;  Location: [place] [facility];                Service: Cardiovascular;  Laterality: N/A; 09/17/2019: DIALYSIS/PERMA CATHETER INSERTION; N/A     Comment:  Procedure: DIALYSIS/PERMA CATHETER EXCHANGE;  Surgeon:               Katha Cabal, MD;  Location: [place] [facility];               Service: Cardiovascular;  Laterality: N/A; 03/18/2020: DIALYSIS/PERMA CATHETER REMOVAL;  N/A     Comment:  Procedure: DIALYSIS/PERMA CATHETER REMOVAL;  Surgeon:               Algernon Huxley, MD;  Location: [place] [facility];                Service: Cardiovascular;  Laterality: N/A; 06/03/2021: LIGATION OF ARTERIOVENOUS  FISTULA; Left     Comment:  Procedure: LIGATION OF ARTERIOVENOUS  FISTULA;  Surgeon:              Algernon Huxley, MD;  Location: [facility];  Service:               Vascular;  Laterality: Left; 12/02/2020: LOWER EXTREMITY ANGIOGRAPHY; Left     Comment:  Procedure: LOWER EXTREMITY ANGIOGRAPHY;  Surgeon: Algernon Huxley, MD;  Location: [place] [facility];  Service:               Cardiovascular;  Laterality: Left; 01/25/2021: LOWER EXTREMITY ANGIOGRAPHY; Right     Comment:  Procedure: LOWER EXTREMITY ANGIOGRAPHY;  Surgeon: Algernon Huxley, MD;  Location: [place] [facility];  Service:                Cardiovascular;  Laterality: Right; 04/15/2021: LOWER EXTREMITY ANGIOGRAPHY; Left     Comment:  Procedure: LOWER EXTREMITY ANGIOGRAPHY;  Surgeon: Algernon Huxley, MD;  Location: [place] [facility];  Service:               Cardiovascular;  Laterality: Left; 05/20/2021: LOWER EXTREMITY ANGIOGRAPHY; Right     Comment:  Procedure: LOWER EXTREMITY ANGIOGRAPHY;  Surgeon: Algernon Huxley, MD;  Location: [place] [facility];  Service:               Cardiovascular;  Laterality: Right; 05/27/2021: LOWER EXTREMITY ANGIOGRAPHY; Left     Comment:  Procedure: LOWER EXTREMITY ANGIOGRAPHY;  Surgeon: Algernon Huxley, MD;  Location: [place] [facility];  Service:               Cardiovascular;  Laterality: Left; 08/04/2021: LOWER EXTREMITY ANGIOGRAPHY; Right     Comment:  Procedure: LOWER EXTREMITY ANGIOGRAPHY;  Surgeon: Algernon Huxley, MD;  Location: [place] [facility];  Service:               Cardiovascular;  Laterality: Right; 11/15/2021: LOWER EXTREMITY ANGIOGRAPHY; Left     Comment:  Procedure: LOWER EXTREMITY ANGIOGRAPHY;  Surgeon: Algernon Huxley, MD;  Location: [place] [facility];  Service:               Cardiovascular;  Laterality: Left; 05/21/2019: pd cath 09/08/2021: TENDON LENGTHENING; Left     Comment:  Procedure: TENDON ACHILLES LENGTHENING;  Surgeon:               Criselda Peaches, DPM;  Location: [facility];  Service:  Podiatry;  Laterality: Left; No date: TONSILLECTOMY 09/08/2021: TRANSMETATARSAL AMPUTATION; Left     Comment:  Procedure: TRANSMETATARSAL AMPUTATION;  Surgeon:               Criselda Peaches, DPM;  Location: [facility];  Service:               Podiatry;  Laterality: Left; 04/15/2021: UPPER EXTREMITY ANGIOGRAPHY; Left     Comment:  Procedure: UPPER EXTREMITY ANGIOGRAPHY;  Surgeon: Algernon Huxley, MD;  Location: [place] [facility];  Service:               Cardiovascular;  Laterality:  Left; 09/15/2019: WOUND DEBRIDEMENT; Bilateral     Comment:  Procedure: DEBRIDEMENT WOUND;  Surgeon: Benjamine Sprague,               DO;  Location: [facility];  Service: General;  Laterality:               Bilateral;  BMI    Body Mass Index: 46.86 kg/m      Reproductive/Obstetrics negative OB ROS                             Anesthesia Physical Anesthesia Plan  ASA: 4 and emergent  Anesthesia Plan: General   Post-op Pain Management:    Induction: Inhalational  PONV Risk Score and Plan: Ondansetron, Dexamethasone, Midazolam and Treatment may vary due to age or medical condition  Airway Management Planned: Oral ETT  Additional Equipment:   Intra-op Plan:   Post-operative Plan: Post-operative intubation/ventilation  Informed Consent: I have reviewed the patients History and Physical, chart, labs and discussed the procedure including the risks, benefits and alternatives for the proposed anesthesia with the patient or authorized representative who has indicated his/her understanding and acceptance.     Dental Advisory Given  Plan Discussed with: CRNA and Surgeon  Anesthesia Plan Comments:         Anesthesia Quick Evaluation

## 2021-12-01 NOTE — Progress Notes (Addendum)
NAME:  [name], MRN:  [id number], DOB:  [birth year], LOS: 5 ADMISSION DATE:  12/06/2021, CONSULTATION DATE:  11/27/21 REFERRING MD:  Dr. Loleta Books, CHIEF COMPLAINT:  CODE STROKE   History of Present Illness:  36 yo F presenting to [facility] ED with complaints of difficulty speaking & left sided hemiparesis around 11:30 AM on 11/19/2021.  (All information gathered from chart review and attending/EDP documentation as patient is unable to participate in interview and there is no family at bedside.) Patient was originally scheduled for a vascular procedure on 11/30/2021 and prior to arriving she had taken antihypertensives at which point her mother states the patient became confused and had difficulty speaking as well as difficulty moving her left side.   ED course: CODE STROKE initiated patient evaluated by neurology.  Patient speech had improved as well as left-sided weakness, however she remains encephalopathic. MRI showed acute and subacute CVA in multiple vascular territories and the patient was admitted by [facility] for further stroke work-up.  It was also noted on admission the patient was pulseless in the right foot for which vascular surgery was consulted. medications given: Patient was started on heparin drip per vascular surgery recommendation of which neurology is aware. Initial Vitals: Afebrile at 98.8, mildly tachypneic at 21, NSR at 88, BP soft 85/74 > 95/74 with SPO2 88% on 2 L nasal cannula. Significant labs: (Labs/ Imaging personally reviewed) I, Domingo Pulse [place], AGACNP-BC, personally viewed and interpreted this ECG. 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 new bony irregularity along the resection margins of the third, fourth and fifth metatarsals concerning for osteomyelitis MRI brain without contrast 11/20/2021: Subcentimeter acute cortically based infarcts with the lateral right frontal lobe and posterior left frontal lobe. patchy small acute infarcts within the callosal splenium on the right additional small recent infarcts (measuring up to 12 mm) are questioned within the left frontoparietal white matter given the small infarcts involving multiple vascular territories there is a suspicion for embolic process. small chronic cortical and subcortical infarct within the right parietoccipital lobes.  Chronic hemorrhagic lacunar infarct within the left caudate nucleus.  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  Mild mucosal thickening within the bilateral ethmoid sinuses.  Trapped fluid within the left petrous apex.  Hospital course: [facility] consulted for admission and neurology consulted for acute /subacute multiple infarcts.  Nephrology consulted due to peritoneal dialysis requirements.  Vascular surgery consulted for assistance with right lower extremity ischemia.  Patient was started on heparin drip with additional imaging and work-up for acute and subacute CVA.  Antibiotics started for concerns of osteomyelitis in the foot.  Echocardiogram 11/27/2021: LVEF 55 to 60%, no regional wall motion abnormalities, mild concentric LVH, elevated LA pressure, RV systolic function is normal, LA size severely dilated. Restricted anterior and posterior mitral valve leaflet motion compared with the echo in 2017 with concern for endocarditis. Moderate MR, Severe MS, moderate-severe TR, no AS.   11/27/21: Early in the day neurology  documented the patient has aphasic but inact in terms of following  commands, with improved left sided hemiparesis and new right arm drift. Per TRH documentation, patient received Dilaudid for severe pain in ischemic LE, then waking up from a nap more confused with more Right sided hemiparesis. STAT CTH ordered. While in CT patient became acutely hypoxic when laid flat, desaturations in the 70's. Rapid response called and patient transferred to SDU for monitoring. CT head wo contrast 11/27/21: No new intracranial abnormalities  PCCM consulted for additional management and monitoring while in SDU.  Pertinent  Medical History  PAD Anemia of chronic disease T2DM Asthma ESRD on PD HFpEF HTN Obesity calciphylaxis  Significant Hospital Events: Including procedures, antibiotic start and stop dates in addition to other pertinent events   11/25/2021: Admit to inpatient as CODE STROKE with multiple  acute/subacute infarcts on MRI, ESRD on PD, acute RLE ischemia & suspected osteomyelitis on L foot with Neurology, Nephrology & Vascular surgery following 11/27/21: Echo concerning for endocarditis, new confusion/right sided hemiparesis-STAT CT ordered, but when patient laid flat became hypoxic- rapid response to SDU with PCCM consult. 11/28/21: US Venous Bilateral revealed No evidence of deep venous thrombosis in either lower extremity to the level of the knees. Calf veins are not well seen due to patient body habitus and subcutaneous edema. 11/28/21: possible endocarditis.  She also has severe comorbid conditions that require multiple specialists involved. Together the entire medical team reviewed plan and agreement to transfer to [facility] for additional evaluation and management. reviewed medical plan with family including mother Ardena Gangl.  11/29/21: remains encephalopathic, increased WOB, emergently intubated, family updated 2/14 remains on vent, pressors, per Nephrology PD overnight 11/19/2021: Pt  with worsening hypotension overnight requiring levophed/neo-synephrine/vasopressin gtts.  Also, vent dyssynchrony overnight no amenable to vent changes or increased sedation requiring vecuronium x1 dose   Cultures:  Respiratory Panel by RT-PCR 02/10>>positive for COVID; negative influenza A/B/ MRSA PCR 02/11>>negative  Blood x2 02/11>>negative Blood x2 02/12>>negative  Genital Wet Prep 02/13>>clue cells present and wbc >=10  Interim History / Subjective:  Pt remains sedated and mechanically intubated.  Requiring 3 pressors.  Underwent peritoneal dialysis overnight   Objective   Blood pressure (!) 67/20, pulse 88, temperature (!) 97 F (36.1 C), resp. rate 17, height _0  (1.676 m), weight 131.7 kg, SpO2 97 %.    Vent Mode: PRVC FiO2 (%):  [30 %-45 %] 40 % Set Rate:  [15 bmp-16 bmp] 15 bmp Vt Set:  [400 mL] 400 mL PEEP:  [5 cmH20] 5 cmH20   Intake/Output Summary (Last 24 hours) at 11/17/2021 0840 Last data filed at 11/28/2021 0700 Gross per 24 hour  Intake 4554.57 ml  Output 0 ml  Net 4554.57 ml   Filed Weights   11/28/21 0500 11/30/21 0500 11/30/21 1809  Weight: 131.5 kg 131.7 kg 131.7 kg    Examination: General: Acutely ill appearing female, sedated/mechanically intubated NAD  HEENT: Supple, no JVD, moist mucous membranes  Neuro: s/p paralytic RASS -5, not following commands, right pupil 4 mm/left pupil 3 mm reactive/sluggish  CV: S1S2, rrr, no M/R/G, trace bilateral lower extremity edema, 1+ via doppler right radial/bilateral popliteal pulses, 1+ palpable left radial pulse Pulm: Faint crackles throughout, even, non labored  GI: soft, rounded, non tender, non distended, bs x 4 Skin: Left TMA wound unable to visualize dressing dry/intact; mottled right foot/toes appear ischemic  Extremities: Left TMA  Resolved Hospital Problem list     Assessment & Plan:  Multiple Acute/subacute infarcts most likely embolic  PMHx: HLD, HTN - Neurology consulted appreciate  input~TEE  once stable; sbp goal 100-140; continue heparin stroke protocol; consider CTA Head and Neck once stable  Hypotension secondary to sepsis suspected due to possible osteomyelitis, possible mitral valve endocarditis, and ischemic right foot Mitral stenosis  - Continuous telemetry monitoring - Vasopressin/Levophed/Neo-synephrine gtts to maintain map >65 - Will start stress dose steroids  - Cardiology consulted appreciate input~TEE once stabilized   Worsening Leukocytosis Suspected Osteomyelitis Query Mitral Endocarditis - Continue cefepime, metronidazole, & vancomycin - Daily CBC, monitor WBC/fever curve - f/u cultures - ID consulted appreciate input  - Continuous cardiac monitoring - BP evaluation inconsistent, concern regarding accurate readings. BP in ICU soft but MAP > 65. PCCM contacted regarding arterial line placement. Discussed with Dr. Lanney Gins that in the setting of severe PAD in BLE and steal syndrome with old fistula would defer any central line placement to vascular surgery if needed. Dr. Lanney Gins in agreement with plan recommending that overnight will treat based on clinical appearance.  Type 2 Diabetes Mellitus Controlled, HgbA1c: 5.1  - Q 4 CBG monitoring - SSI  - Target CBG range 140-180 - Follow ICU hypo/hyper-glycemia protocol  Acute Hypoxic Respiratory Failure  Mechanical intubation COVID-19 infection  - Mechanical ventilation via ARDS protocol, target PRVC 6 cc/kg - Wean PEEP and FiO2 as able to maintain O2 sats >88% - Goal plateau pressure less than 30, driving pressure less than 15 - Paralytics if necessary for vent synchrony, gas exchange - Cycle prone positioning if necessary for oxygenation - Deep sedation per PAD protocol, goal RASS -3 to -4 for now, currently fentanyl, midazolam - Diuresis as blood pressure and renal function can tolerate, goal CVP 5-8.   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  Hx: PAD - Vascular surgery following, appreciate input~proceeding with bilateral lower extremity guillotine  - Sedimentation rate pending  - On heparin drip per pharmacy protocol  Best Practice (right click and "Reselect all SmartList Selections" daily)  Diet/type: NPO; will restart trickle feeds following surgical procedure  DVT prophylaxis: systemic heparin GI prophylaxis: PPI Lines: Right femoral trialysis catheter; peritoneal dialysis catheter  Foley:  N/A Code Status:  full code Last date of multidisciplinary goals of care discussion [11/28/2021]  Extensive discussion with pts sister Frederik Pear and mother Avabella Wailes (myself, Dr. Mortimer Fries, and Dr, Candiss Norse present) regarding pt prognosis, code status, and current plan of care.  Following discussion pts family would like to continue with aggressive measures, however if pt cardiac arrest they would only want 1 round of CPR. Please contact pts sister Frederik Pear first if pt declines and she will inform pts mother Suhaila Troiano per Mrs. Kolle's request.  Labs   CBC: Recent Labs  Lab 11/19/2021 1320 12/10/2021 1412 11/27/21 0528 11/28/21 0602 11/29/21 0342 11/30/21 0430 12/02/2021 0338  WBC 17.2* 16.6* 14.4* 17.4* 23.3* 30.5* 36.5*  NEUTROABS 14.0* 14.0* 11.8*  --   --   --   --   HGB 9.2* 9.4* 8.6* 8.6* 8.5* 8.8* 9.2*  HCT 28.4* 29.6* 27.4* 27.4* 27.5* 28.9* 30.1*  MCV 99.6 98.7 100.4* 100.4* 100.0 101.8* 102.4*  PLT 280 247 242 253 274 267 656    Basic Metabolic  Panel: Recent Labs  Lab 11/27/21 0528 11/28/21 0602 11/29/21 0342 11/30/21 0314 12/11/2021 0338 11/30/2021 0601  NA 138 136 134* 135 132*  --   K 4.1 4.0 3.5 3.6 3.7   --   CL 94* 93* 93* 95* 93*  --   CO2 17* 18* 15* 20* 18*  --   GLUCOSE 98 108* 152* 128* 154*  --   BUN 67* 67* 64* 56* 55*  --   CREATININE 12.01* 11.50* 11.44* 10.89* 10.30*  --   CALCIUM 8.2* 7.9* 7.8* 8.1* 8.1*  --   MG  --  1.5* 1.6* 2.0  --  1.8  PHOS  --  10.0* 9.3* 10.8*  --   --    GFR: Estimated Creatinine Clearance: 10.5 mL/min (A) (by C-G formula based on SCr of 10.3 mg/dL (H)). Recent Labs  Lab 11/28/21 0602 11/29/21 0342 11/30/21 0430 11/30/2021 0338 12/05/2021 0339 11/25/2021 0601  WBC 17.4* 23.3* 30.5* 36.5*  --   --   LATICACIDVEN  --   --   --   --  2.9* 2.1*    Liver Function Tests: Recent Labs  Lab 11/27/21 0528 11/28/21 0602 11/29/21 0342 11/30/21 0314 11/24/2021 0338  AST 11* 11* 12* 10* 20  ALT 6 6 <_0 ALKPHOS 124 120 132* 129* 135*  BILITOT 0.7 1.0 1.0 0.6 0.9  PROT 6.6 6.5 6.6 6.8 6.8  ALBUMIN 2.7* 2.8* 2.7* 2.7* 2.5*   No results for input(s): LIPASE, AMYLASE in the last 168 hours. No results for input(s): AMMONIA in the last 168 hours.  ABG    Component Value Date/Time   PHART 7.22 (L) 12/11/2021 0406   PCO2ART 49 (H) 12/13/2021 0406   PO2ART 113 (H) 12/08/2021 0406   HCO3 20.0 11/30/2021 0406   TCO2 26 09/08/2021 1418   ACIDBASEDEF 7.7 (H) 11/29/2021 0406   O2SAT 99.0 11/29/2021 0406     Coagulation Profile: Recent Labs  Lab 12/05/2021 2222  INR 1.3*    Cardiac Enzymes: No results for input(s): CKTOTAL, CKMB, CKMBINDEX, TROPONINI in the last 168 hours.  HbA1C: Hgb A1c MFr Bld  Date/Time Value Ref Range Status  12/13/2021 06:30 PM 5.1 4.8 - 5.6 % Final    Comment:    (NOTE) Pre diabetes:          5.7%-6.4%  Diabetes:              >6.4%  Glycemic control for   <7.0% adults with diabetes   11/24/2021 10:05 AM 5.0 4.8 - 5.6 % Final    Comment:    (NOTE) Pre diabetes:          5.7%-6.4%  Diabetes:              >6.4%  Glycemic control for   <7.0% adults with diabetes     CBG: Recent Labs  Lab 11/30/21 2221  12/07/2021 0003 12/07/2021 0217 12/02/2021 0356 11/25/2021 0633  GLUCAP 76 141* 133* 151* 123*    Review of Systems:   Unable to assess pt mechanically intubated   Past Medical History:  She,  has a past medical history of Anemia of chronic disease, Asthma, Calciphylaxis (2020), CHF (congestive heart failure) ([place]), CKD (chronic kidney disease), stage III ([place]), Diabetes ([place]), Dyspnea, Hypertension, Morbid obesity ([place]), Nephrotic syndrome, PAD (peripheral artery disease) ([place]), and Sepsis ([place]) (06/2021).   Surgical History:   Past Surgical History:  Procedure Laterality Date   A/V FISTULAGRAM Left 10/29/2020   Procedure: A/V FISTULAGRAM;  Surgeon: Algernon Huxley, MD;  Location: [place] [facility];  Service: Cardiovascular;  Laterality: Left;   APPLICATION OF WOUND VAC Bilateral 09/15/2019   Procedure: APPLICATION OF WOUND VAC;  Surgeon: Benjamine Sprague, DO;  Location: [facility];  Service: General;  Laterality: Bilateral;   AV FISTULA PLACEMENT Left 07/09/2020   Procedure: ARTERIOVENOUS (AV) FISTULA CREATION ( BRACHIAL CEPHALIC);  Surgeon: Algernon Huxley, MD;  Location: [facility];  Service: Vascular;  Laterality: Left;   DIALYSIS/PERMA CATHETER INSERTION N/A 02/28/2019   Procedure: DIALYSIS/PERMA CATHETER INSERTION;  Surgeon: Algernon Huxley, MD;  Location: [place] [facility];  Service: Cardiovascular;  Laterality: N/A;   DIALYSIS/PERMA CATHETER INSERTION N/A 09/17/2019   Procedure: DIALYSIS/PERMA CATHETER EXCHANGE;  Surgeon: Katha Cabal, MD;  Location: [place] [facility];  Service: Cardiovascular;  Laterality: N/A;   DIALYSIS/PERMA CATHETER REMOVAL N/A 03/18/2020   Procedure: DIALYSIS/PERMA CATHETER REMOVAL;  Surgeon: Algernon Huxley, MD;  Location: [place] [facility];  Service: Cardiovascular;  Laterality: N/A;   LIGATION OF ARTERIOVENOUS  FISTULA Left 06/03/2021   Procedure: LIGATION OF ARTERIOVENOUS  FISTULA;  Surgeon: Algernon Huxley, MD;  Location: [facility];  Service: Vascular;  Laterality:  Left;   LOWER EXTREMITY ANGIOGRAPHY Left 12/02/2020   Procedure: LOWER EXTREMITY ANGIOGRAPHY;  Surgeon: Algernon Huxley, MD;  Location: [place] [facility];  Service: Cardiovascular;  Laterality: Left;   LOWER EXTREMITY ANGIOGRAPHY Right 01/25/2021   Procedure: LOWER EXTREMITY ANGIOGRAPHY;  Surgeon: Algernon Huxley, MD;  Location: [place] [facility];  Service: Cardiovascular;  Laterality: Right;   LOWER EXTREMITY ANGIOGRAPHY Left 04/15/2021   Procedure: LOWER EXTREMITY ANGIOGRAPHY;  Surgeon: Algernon Huxley, MD;  Location: [place] [facility];  Service: Cardiovascular;  Laterality: Left;   LOWER EXTREMITY ANGIOGRAPHY Right 05/20/2021   Procedure: LOWER EXTREMITY ANGIOGRAPHY;  Surgeon: Algernon Huxley, MD;  Location: [place] [facility];  Service: Cardiovascular;  Laterality: Right;   LOWER EXTREMITY ANGIOGRAPHY Left 05/27/2021   Procedure: LOWER EXTREMITY ANGIOGRAPHY;  Surgeon: Algernon Huxley, MD;  Location: [place] [facility];  Service: Cardiovascular;  Laterality: Left;   LOWER EXTREMITY ANGIOGRAPHY Right 08/04/2021   Procedure: LOWER EXTREMITY ANGIOGRAPHY;  Surgeon: Algernon Huxley, MD;  Location: [place] [facility];  Service: Cardiovascular;  Laterality: Right;   LOWER EXTREMITY ANGIOGRAPHY Left 11/15/2021   Procedure: LOWER EXTREMITY ANGIOGRAPHY;  Surgeon: Algernon Huxley, MD;  Location: [place] [facility];  Service: Cardiovascular;  Laterality: Left;   pd cath  05/21/2019   TENDON LENGTHENING Left 09/08/2021   Procedure: TENDON ACHILLES LENGTHENING;  Surgeon: Criselda Peaches, DPM;  Location: [facility];  Service: Podiatry;  Laterality: Left;   TONSILLECTOMY     TRANSMETATARSAL AMPUTATION Left 09/08/2021   Procedure: TRANSMETATARSAL AMPUTATION;  Surgeon: Criselda Peaches, DPM;  Location: [facility];  Service: Podiatry;  Laterality: Left;   UPPER EXTREMITY ANGIOGRAPHY Left 04/15/2021   Procedure: UPPER EXTREMITY ANGIOGRAPHY;  Surgeon: Algernon Huxley, MD;  Location: [place] [facility];  Service: Cardiovascular;   Laterality: Left;   WOUND DEBRIDEMENT Bilateral 09/15/2019   Procedure: DEBRIDEMENT WOUND;  Surgeon: Benjamine Sprague, DO;  Location: [facility];  Service: General;  Laterality: Bilateral;     Social History:   reports that she has quit smoking. Her smoking use included cigars. She has never used smokeless tobacco. She reports that she does not drink alcohol and does not use drugs.   Family History:  Her family history includes CAD in her mother; Cancer  in her maternal grandfather and maternal grandmother; Hypertension in her mother; Kidney disease in her father; Multiple sclerosis in her mother.   Allergies Allergies  Allergen Reactions   Percocet [Oxycodone-Acetaminophen] Itching   Tomato Itching   Gabapentin Other (See Comments)    Extremity tremors     Home Medications  Prior to Admission medications   Medication Sig Start Date End Date Taking? Authorizing Provider  amLODipine (NORVASC) 10 MG tablet Take 10 mg by mouth daily as needed (elevated blood pressure).   Yes [provider]  aspirin EC 81 MG EC tablet Take 1 tablet (81 mg total) by mouth daily. 02/16/16  Yes Demetrios Loll, MD  atorvastatin (LIPITOR) 20 MG tablet Take 1 tablet (20 mg total) by mouth daily. 11/15/21  Yes Dew, Erskine Squibb, MD  carvedilol (COREG) 25 MG tablet Take 25 mg by mouth 2 (two) times daily as needed (elevated blood pressure).   Yes [provider]  clopidogrel (PLAVIX) 75 MG tablet Take 1 tablet (75 mg total) by mouth daily. 11/15/21  Yes Dew, Erskine Squibb, MD  isosorbide mononitrate (IMDUR) 30 MG 24 hr tablet Take 30 mg by mouth 2 (two) times daily as needed (elevated blood pressure).   Yes [provider]  losartan (COZAAR) 100 MG tablet Take 100 mg by mouth daily as needed (elevated blood pressure).   Yes [provider]  cilostazol (PLETAL) 100 MG tablet Take 1 tablet (100 mg total) by mouth 2 (two) times daily before a meal. Patient not taking: Reported on 11/27/2021 05/07/21    Kris Hartmann, NP  lactulose (CHRONULAC) 10 GM/15ML solution Take 30 mLs (20 g total) by mouth 2 (two) times daily as needed for mild constipation. Patient not taking: Reported on 12/13/2021 07/09/21   Edwin Dada, MD  multivitamin (RENA-VIT) TABS tablet Take 1 tablet by mouth at bedtime. Patient not taking: Reported on 11/15/2021 09/18/19   Fritzi Mandes, MD  ondansetron (ZOFRAN) 4 MG tablet Take 1 tablet (4 mg total) by mouth every 6 (six) hours as needed for nausea. Patient not taking: Reported on 11/09/2021 09/08/21   Criselda Peaches, DPM     Critical care time: 60 minutes     Rosilyn Mings, [place] Pager [phone] (please enter 7 digits) PCCM Consult Pager [phone] (please enter 7 digits)

## 2021-12-01 NOTE — TOC Initial Note (Signed)
Transition of Care [facility]) - Initial/Assessment Note    Patient Details  Name: [name] MRN: [id number] Date of Birth: [birth year]  Transition of Care [facility]) CM/SW Contact:    Shelbie Hutching, RN Phone Number: 12/02/2021, 10:08 AM  Clinical Narrative:                 Patient admitted to the hospital after acute CVA, patient also COVID positive.  Patient has PVD previous partial left foot amputation, was scheduled for foot amputation day of admission.  Patient is ESRD on peritoneal dialysis at home. Patient is currently critically ill in the ICU, intubated and sedated requiring pressors.  TOC will follow.    Expected Discharge Plan:  (TBD) Barriers to Discharge: Continued Medical Work up   Patient Goals and CMS Choice Patient states their goals for this hospitalization and ongoing recovery are:: patient unable to state- intubated and sedated- family wants to continue care      Expected Discharge Plan and Services Expected Discharge Plan:  (TBD)       Living arrangements for the past 2 months: Single Family Home                 DME Arranged: N/A DME Agency: NA       HH Arranged: NA [place] Agency: NA        Prior Living Arrangements/Services Living arrangements for the past 2 months: Single Family Home   Patient language and need for interpreter reviewed:: Yes        Need for Family Participation in Patient Care: Yes (Comment) (ESRD, acute CVA) Care giver support system in place?: Yes (comment) (mother and sister)   Criminal Activity/Legal Involvement Pertinent to Current Situation/Hospitalization: No - Comment as needed  Activities of Daily Living Home Assistive Devices/Equipment: Environmental consultant (specify type), Wheelchair ADL Screening (condition at time of admission) Patient's cognitive ability adequate to safely complete daily activities?: Yes Is the patient deaf or have difficulty hearing?: No Does the patient have difficulty seeing, even when wearing  glasses/contacts?: No Does the patient have difficulty concentrating, remembering, or making decisions?: No Patient able to express need for assistance with ADLs?: No (dysarthria due to recent stroke) Does the patient have difficulty dressing or bathing?: Yes Independently performs ADLs?: No Communication: Needs assistance Is this a change from baseline?: Change from baseline, expected to last >3 days Dressing (OT): Needs assistance Is this a change from baseline?: Change from baseline, expected to last >3 days Grooming: Needs assistance Is this a change from baseline?: Change from baseline, expected to last >3 days Feeding: Needs assistance Is this a change from baseline?: Change from baseline, expected to last >3 days Bathing: Needs assistance Is this a change from baseline?: Change from baseline, expected to last >3 days Toileting: Needs assistance Is this a change from baseline?: Change from baseline, expected to last >3days In/Out Bed: Dependent Is this a change from baseline?: Pre-admission baseline Walks in Home: Needs assistance Is this a change from baseline?: Pre-admission baseline Does the patient have difficulty walking or climbing stairs?: Yes Weakness of Legs: Left Weakness of Arms/Hands: Left  Permission Sought/Granted   Permission granted to share information with : No              Emotional Assessment Appearance:: Appears older than stated age Attitude/Demeanor/Rapport: Intubated (Following Commands or Not Following Commands) Affect (typically observed): Unable to Assess   Alcohol / Substance Use: Not Applicable Psych Involvement: No (comment)  Admission diagnosis:  PVD (peripheral vascular  disease) ([place]) [I73.9] Pain [R52] Dysarthria [R47.1] Acute CVA (cerebrovascular accident) ([place]) [I63.9] Cerebrovascular 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 ([place]) [I63.40] COVID-19 [U07.1] Patient Active Problem List   Diagnosis Date Noted    Hyperphosphatemia 73/53/2992   Metabolic acidosis 42/68/3419   Likely osteomyelitis ([place]) 11/28/2021   Cerebrovascular 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 ([place])    COVID-19 virus infection 62/22/9798   Acute metabolic encephalopathy 92/08/9416   Acute ischemic stroke ([place]) 12/13/2021   Ischemic foot 12/02/2021   Atherosclerosis of native arteries of the extremities with ulceration ([place]) 11/09/2021   Equinus contracture of left ankle    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, left (HCC)    Cholecystitis, unspecified 07/08/2021   Extremity cyanosis 09/29/2020   Obesity, Class III, BMI 40-49.9 (morbid obesity) ([place]) 09/15/2019   Infection of anterior lower leg 09/14/2019   Calciphylaxis    Chronic diastolic CHF (congestive heart failure) ([place])    Asthma 07/18/2019   ESRD (end stage renal disease) on dialysis ([place]) 05/02/2019   Essential hypertension 05/02/2019   Impaired fasting glucose 08/31/2017   Menorrhagia    Nephrotic syndrome    Anemia in chronic kidney disease    Iron deficiency anemia 02/12/2016   PCP:  Danelle Berry, NP Pharmacy:   [facility] [address] ([address]), [place] [address] [place]) [place] [address] Phone: [phone] Fax: [phone]     Social Determinants of Health (SDOH) Interventions    Readmission Risk Interventions Readmission Risk Prevention Plan 09/18/2019  Transportation Screening Complete  PCP or Specialist Appt within 3-5 Days Complete  HRI or [place] Complete  Social Work Consult for [place] Planning/Counseling Complete  Palliative Care Screening Not Applicable  Medication Review Press photographer) Complete  Some recent data might be hidden

## 2021-12-01 NOTE — Progress Notes (Incomplete)
Progress Note  Patient Name: [name] Date of Encounter: 12/11/2021  [facility] Cardiologist: None ***  Subjective   ***  Inpatient Medications    Scheduled Meds:  vitamin C  500 mg Per Tube BID   aspirin  300 mg Rectal Daily   Or   aspirin  325 mg Oral Daily   chlorhexidine gluconate (MEDLINE KIT)  15 mL Mouth Rinse BID   Chlorhexidine Gluconate Cloth  6 each Topical Daily   collagenase   Topical Daily   docusate  100 mg Per Tube BID   feeding supplement (PROSource TF)  45 mL Per Tube TID   feeding supplement (VITAL AF 1.2 CAL)  1,000 mL Per Tube Q24H   free water  30 mL Per Tube Q4H   gentamicin cream  1 application Topical Daily   mouth rinse  15 mL Mouth Rinse 10 times per day   multivitamin  1 tablet Per Tube QHS   pantoprazole (PROTONIX) IV  40 mg Intravenous Q24H   polyethylene glycol  17 g Per Tube Daily   Continuous Infusions:  sodium chloride Stopped (11/30/2021 0747)   ceFEPime (MAXIPIME) IV Stopped (11/30/21 1119)   dialysis solution 1.5% low-MG/low-CA     famotidine (PEPCID) IV     fentaNYL infusion INTRAVENOUS 200 mcg/hr (11/24/2021 0800)   heparin 2,300 Units/hr (11/22/2021 0817)   metronidazole Stopped (12/14/2021 0122)   midazolam 4 mg/hr (12/08/2021 0800)   norepinephrine (LEVOPHED) Adult infusion 18 mcg/min (11/23/2021 0816)   phenylephrine (NEO-SYNEPHRINE) Adult infusion 140 mcg/min (11/30/2021 0815)   sodium bicarbonate 150 mEq in D5W infusion 100 mL/hr at 12/10/2021 0837   vancomycin Stopped (11/30/21 1857)   vasopressin 0.03 Units/min (12/12/2021 0800)   PRN Meds: sodium chloride, acetaminophen **OR** acetaminophen (TYLENOL) oral liquid 160 mg/5 mL **OR** acetaminophen, fentaNYL, fentaNYL (SUBLIMAZE) injection, hydrALAZINE   Vital Signs    Vitals:   11/29/2021 0700 12/03/2021 0715 12/05/2021 0800 12/02/2021 0835  BP:      Pulse: 93 94 87 88  Resp: _0 Temp: (!) 97 F (36.1 C) (!) 97 F (36.1 C) (!) 97.3 F (36.3 C) (!) 96.8 F (36 C)   TempSrc:      SpO2: 94% 94% 96% 97%  Weight:      Height:        Intake/Output Summary (Last 24 hours) at 12/13/2021 0959 Last data filed at 12/14/2021 0800 Gross per 24 hour  Intake 4796.89 ml  Output 0 ml  Net 4796.89 ml   Last 3 Weights 11/30/2021 11/30/2021 11/28/2021  Weight (lbs) 290 lb 5.5 oz 290 lb 5.5 oz 289 lb 14.5 oz  Weight (kg) 131.7 kg 131.7 kg 131.5 kg      Telemetry    *** - Personally Reviewed  ECG    *** - Personally Reviewed  Physical Exam  *** GEN: No acute distress.   Neck: No JVD Cardiac: RRR, no murmurs, rubs, or gallops.  Respiratory: Clear to auscultation bilaterally. GI: Soft, nontender, non-distended  MS: No edema; No deformity. Neuro:  Nonfocal  Psych: Normal affect   Labs    High Sensitivity Troponin:   Recent Labs  Lab 12/06/2021 0338 11/18/2021 0532  TROPONINIHS 269* 269*     Chemistry Recent Labs  Lab 11/29/21 0342 11/30/21 0314 12/07/2021 0338 11/21/2021 0601  NA 134* 135 132*  --   K 3.5 3.6 3.7  --   CL 93* 95* 93*  --   CO2 15* 20* 18*  --  GLUCOSE 152* 128* 154*  --   BUN 64* 56* 55*  --   CREATININE 11.44* 10.89* 10.30*  --   CALCIUM 7.8* 8.1* 8.1*  --   MG 1.6* 2.0  --  1.8  PROT 6.6 6.8 6.8  --   ALBUMIN 2.7* 2.7* 2.5*  --   AST 12* 10* 20  --   ALT <_0 --   ALKPHOS 132* 129* 135*  --   BILITOT 1.0 0.6 0.9  --   GFRNONAA 4* 4* 5*  --   ANIONGAP 26* 20* 21*  --     Lipids  Recent Labs  Lab 11/20/2021 1830 11/30/21 0315  CHOL 125  --   TRIG 73 125  HDL 34*  --   LDLCALC 76  --   CHOLHDL 3.7  --     Hematology Recent Labs  Lab 11/29/21 0342 11/30/21 0430 11/21/2021 0338  WBC 23.3* 30.5* 36.5*  RBC 2.75* 2.84* 2.94*  HGB 8.5* 8.8* 9.2*  HCT 27.5* 28.9* 30.1*  MCV 100.0 101.8* 102.4*  MCH 30.9 31.0 31.3  MCHC 30.9 30.4 30.6  RDW 18.0* 18.2* 18.2*  PLT 274 267 253   Thyroid No results for input(s): TSH, FREET4 in the last 168 hours.  BNP Recent Labs  Lab 11/19/2021 0338  BNP 1,566.6*     DDimer  Recent Labs  Lab 11/27/21 0528 11/30/21 0314 12/07/2021 0338  DDIMER 0.61* 2.94* 2.63*     Radiology    DG Chest 1 View  Result Date: 12/07/2021 CLINICAL DATA:  Pneumothorax EXAM: CHEST  1 VIEW COMPARISON:  11/30/2021 FINDINGS: Endotracheal tube and NG tube remain in place unchanged. Cardiomegaly with vascular congestion. Bilateral airspace opacities slightly worsened since prior study, likely worsening edema. Decreasing lung volumes. No effusions. No acute bony abnormality. IMPRESSION: Cardiomegaly with mild vascular congestion and edema/CHF, slightly worsening since prior study. Low lung volumes. Electronically Signed   By: Rolm Baptise M.D.   On: 11/17/2021 03:55   DG Chest Port 1 View  Result Date: 11/30/2021 CLINICAL DATA:  Acute respiratory failure with hypoxia EXAM: PORTABLE CHEST 1 VIEW COMPARISON:  Chest radiograph from one day prior. FINDINGS: Endotracheal tube tip is 2.0 cm above the carina. Enteric tube enters stomach with the tip not seen on this image. Stable cardiomediastinal silhouette with mild to moderate cardiomegaly. No pneumothorax. No pleural effusion. Low lung volumes. Mild pulmonary edema, improved. IMPRESSION: 1. Well-positioned support structures. 2. Mild congestive heart failure, improved. 3. Low lung volumes. Electronically Signed   By: Ilona Sorrel M.D.   On: 11/30/2021 08:08   Portable Chest x-ray  Result Date: 11/29/2021 CLINICAL DATA:  Intubated EXAM: PORTABLE CHEST 1 VIEW COMPARISON:  11/27/2021 FINDINGS: Initial film is rotated to the left which was repeated. Endotracheal tube approximately 2.5 cm above the carina. NG tube within the stomach. Heart is enlarged with vascular congestion and diffuse interstitial prominence suggesting component of mild interstitial edema. Difficult to exclude small posterior layering effusions. Associated bibasilar atelectasis. No large pneumothorax. IMPRESSION: Limited exam but endotracheal tube approximately 2.5 cm above  the carina. Cardiomegaly with mild interstitial edema pattern and basilar atelectasis. Electronically Signed   By: Jerilynn Mages.  Shick M.D.   On: 11/29/2021 12:08    Cardiac Studies   Echo 11/27/21  1. Left ventricular ejection fraction, by estimation, is 55 to 60%. The  left ventricle has normal function. The left ventricle has no regional  wall motion abnormalities. There is mild concentric left ventricular  hypertrophy.  Left ventricular diastolic  parameters are indeterminate. Elevated left atrial pressure.   2. Right ventricular systolic function is normal. The right ventricular  size is normal.   3. Left atrial size was severely dilated.   4. Restricted anterior and posterior mitral valve leaflet motion.  Compared with the echo in 2017, the mitral valve is now heavily thickened  and clacified. These findings are all new. Could represent endocarditis.  Recommend TEE to better evaluate. The  mitral valve is degenerative. Moderate mitral valve regurgitation. Severe  mitral stenosis. Severe mitral annular calcification.   5. Tricuspid valve regurgitation is moderate to severe.   6. The aortic valve is tricuspid. Aortic valve regurgitation is not  visualized. No aortic stenosis is present.   7. The inferior vena cava is normal in size with greater than 50%  respiratory variability, suggesting right atrial pressure of 3 mmHg.    DVT study 11/28/21 IMPRESSION: No evidence of deep venous thrombosis in either lower extremity to the level of the knees. Calf veins are not well seen due to patient body habitus and subcutaneous edema.   Echo 2017 -------------------------------------------------------------------  Study Conclusions   - Left ventricle: The cavity size was at the upper limits of    normal. There was mild concentric hypertrophy. Systolic function    was normal. The estimated ejection fraction was in the range of    50% to 55%. Wall motion was normal; there were no regional wall     motion abnormalities. Doppler parameters are consistent with    abnormal left ventricular relaxation (grade 1 diastolic    dysfunction).  - Left atrium: The atrium was mildly dilated.  - Right ventricle: Systolic function was normal.  - Pulmonary arteries: Systolic pressure was within the normal    range.  - Pericardium, extracardiac: A small pericardial effusion was    identified.   Patient Profile     36 y.o. 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  Assessment & Plan    Severe Mitral stenosis Possible endocarditis - pt was scheduled for a vascular procedure on 2/10, however before the procedure she became acutely confused and had difficulty speaking. Code stroke called in the ER. MRI showed acute and subacute strokes in multiple territories consistent with embolic disease. MRI positive for stroke, pulseless in the right foot, COVID positive started on IV heparin and IV abx with concern for osteomyelitis.  - Echo showed LVEF 55-60% with mild MVH, severe MV thickening with moderate mitral valve vegetation and severe mitral stenosis, with moderate mitral vegetation and severe mitral stenosis and moderate severe tricuspid regurgitation, mean gradient 19mHg and valve area of 1.8cm by pressure half-time and 0.91 cm by the dimensionless index.  - Echo in 2017 with no significant valvular abnormalities.  [facility] however [facility] was [facility], also may not be a surgical candidate - Patient needs TEE, however she is high risk of intubation. Can perform if mental status/respiratory status improves.  - Monitor Hgb with IV heparin - IV abx per CCM/IM   AMS Embolic  CVA - MRI head on admission with acute and subacute strokes - On 2/11 patient was persistently encephalopathic. Repeat CT head was unchanged. She became hypoxic and  was transferred to [facility]. CXR showed new pulmonary infiltrates. - Echo with possible MV endocarditis as above - DVT study negative - aspirin suppository - IV heparin and IV abx - Neurology following   Septic shock Acute hypoxic and hypercapnic respiratory failure COVID 19 infection - IV abx  - vent management per CCM   Leukocytosis Suspected osteomyelitis - IV abx per IM   PAD - ischemic foot on admission - Vascular following - IV heparin    ESRD on PD - nephrology following    For questions or updates, please contact [facility] Please consult [email or web address] for contact info under        Signed, Nykole Matos Ninfa Meeker, PA-C  11/30/2021, 9:59 AM

## 2021-12-01 NOTE — Progress Notes (Signed)
Rate change made per NP

## 2021-12-01 NOTE — Progress Notes (Signed)
Telephone report called to Elizabeth Palau, RN.  Patient to be transported to OR for bilateral Guillotine amputation preformed by Dr. [name].

## 2021-12-01 NOTE — Progress Notes (Signed)
Neurology Progress Note   S:// Yesterday pupils were unreactive and CT head showed new multifocal areas of stroke some of which are large territory.  O:// Current vital signs: BP (!) 67/20 (BP Location: Right Wrist)    Pulse 94    Temp (!) 97 F (36.1 C)    Resp 15    Ht 5' 6" (1.676 m)    Wt 131.7 kg    SpO2 94%    BMI 46.86 kg/m  Vital signs in last 24 hours: Temp:  [96.4 F (35.8 C)-99.3 F (37.4 C)] 97 F (36.1 C) (02/15 0715) Pulse Rate:  [83-101] 94 (02/15 0715) Resp:  [7-28] 15 (02/15 0715) BP: (67)/(20) 67/20 (02/14 0900) SpO2:  [88 %-100 %] 94 % (02/15 0715) Arterial Line BP: (80-120)/(41-66) 105/52 (02/15 0715) FiO2 (%):  [30 %-45 %] 40 % (02/15 0600) Weight:  [131.7 kg] 131.7 kg (02/14 1809)  General: Sick appearing obese woman, appears older than stated age, laying in bed, intubated. HEENT: Normocephalic, atraumatic CVS: Regular rhythm Abdomen: Obese without tenderness Extremities: Edematous, right calf ulcer looks healed, both feet bandaged. Respiratory: bilaterally equal excursions, no audible wheeze/cuff leak. Comatose on sedation, nonverbal. Does not open eyes. Pupils ~34m equal round sluggishly reactive No spontaneous motor movent. No withdrawal to noxious stimulus DTRs mute Toes mute.  Medications  Current Facility-Administered Medications:    0.9 %  sodium chloride infusion, , Intravenous, PRN, KFlora Lipps MD, Last Rate: 10 mL/hr at 12/02/2021 0700, Infusion Verify at 11/22/2021 0700   acetaminophen (TYLENOL) tablet 650 mg, 650 mg, Oral, Q4H PRN **OR** acetaminophen (TYLENOL) 160 MG/5ML solution 650 mg, 650 mg, Per Tube, Q4H PRN, 650 mg at 11/27/21 0123 **OR** acetaminophen (TYLENOL) suppository 650 mg, 650 mg, Rectal, Q4H PRN, KRise Patience MD, 650 mg at 11/27/21 09179  ascorbic acid (VITAMIN C) tablet 500 mg, 500 mg, Per Tube, BID, KMortimer Fries Kurian, MD, 500 mg at 11/30/21 2118   aspirin suppository 300 mg, 300 mg, Rectal, Daily, 300 mg at 11/30/21  1102 **OR** aspirin tablet 325 mg, 325 mg, Oral, Daily, KRise Patience MD, 325 mg at 11/22/2021 1804   ceFEPIme (MAXIPIME) 1 g in sodium chloride 0.9 % 100 mL IVPB, 1 g, Intravenous, Daily, HDarnelle Bos RPH, Stopped at 11/30/21 1119   chlorhexidine gluconate (MEDLINE KIT) (PERIDEX) 0.12 % solution 15 mL, 15 mL, Mouth Rinse, BID, Kasa, Kurian, MD, 15 mL at 11/30/21 1950   Chlorhexidine Gluconate Cloth 2 % PADS 6 each, 6 each, Topical, Daily, KRise Patience MD, 6 each at 11/30/21 0523   collagenase (SANTYL) ointment, , Topical, Daily, KRise Patience MD, Given at 11/30/2021 0432   dialysis solution 1.5% low-MG/low-CA dianeal solution, , Intraperitoneal, Q24H, Kolluru, Sarath, MD, 6,000 mL at 11/30/21 1839   docusate (COLACE) 50 MG/5ML liquid 100 mg, 100 mg, Per Tube, BID, KDarel HongD, NP, 100 mg at 11/30/21 2118   famotidine (PEPCID) IVPB 20 mg premix, 20 mg, Intravenous, Q24H, GVallery SaD, RPH   feeding supplement (PROSource TF) liquid 45 mL, 45 mL, Per Tube, TID, KMortimer Fries Kurian, MD   feeding supplement (VITAL AF 1.2 CAL) liquid 1,000 mL, 1,000 mL, Per Tube, Q24H, Kasa, Kurian, MD, Last Rate: 20 mL/hr at 11/30/21 1503, 1,000 mL at 11/30/21 1503   fentaNYL (SUBLIMAZE) bolus via infusion 50-100 mcg, 50-100 mcg, Intravenous, Q15 min PRN, KDarel HongD, NP, 50 mcg at 11/30/21 0624   fentaNYL (SUBLIMAZE) injection 12.5 mcg, 12.5 mcg, Intravenous, Q2H PRN, Danford, Christopher P,  MD, 12.5 mcg at 11/29/21 0905   fentaNYL 2569mg in NS 2563m(1018mml) infusion-PREMIX, 50-200 mcg/hr, Intravenous, Continuous, KeeDarel Hong NP, Last Rate: 20 mL/hr at 12/05/2021 0700, 200 mcg/hr at 11/23/2021 0700   free water 30 mL, 30 mL, Per Tube, Q4H, KasFlora LippsD, 30 mL at 11/19/2021 [id number] cream (GARAMYCIN) 0.1 % 1 application, 1 application, Topical, Daily, Kolluru, Sarath, MD, 1 application at 02/69/79/4836   heparin ADULT infusion 100 units/mL (25000 units/250m45m2,300  Units/hr, Intravenous, Continuous, GrubDallie PilesH, Last Rate: 22 mL/hr at 12/03/2021 0700, 2,200 Units/hr at 12/02/2021 0700   hydrALAZINE (APRESOLINE) injection 5 mg, 5 mg, Intravenous, Q4H PRN, KakrRise Patience   magnesium sulfate IVPB 2 g 50 mL, 2 g, Intravenous, Once, NelsTeressa Lower   MEDLINE mouth rinse, 15 mL, Mouth Rinse, 10 times per day, KasaFlora Lipps, 15 mL at 11/19/2021 [id number] (FLAGYL) IVPB 500 mg, 500 mg, Intravenous, Q12H, KasaFlora Lipps, Stopped at 11/30/2021 0122   midazolam (VERSED) 100 mg/100 mL (1 mg/mL) premix infusion, 0.5-10 mg/hr, Intravenous, Continuous, Kasa, Kurian, MD, Last Rate: 4 mL/hr at 12/08/2021 0700, 4 mg/hr at 12/14/2021 0700   multivitamin (RENA-VIT) tablet 1 tablet, 1 tablet, Per Tube, QHS, KasaFlora Lipps   norepinephrine (LEVOPHED) 16 mg in 250mL52mmix infusion, 0-40 mcg/min, Intravenous, Titrated, Rust-Chester, BrittHuel Cote Last Rate: 16.88 mL/hr at 11/28/2021 0816, 18 mcg/min at 12/05/2021 0816   pantoprazole (PROTONIX) injection 40 mg, 40 mg, Intravenous, Q24H, Rust-Chester, BrittToribio HarbourP, 40 mg at 12/08/2021 0214   phenylephrine (NEO-SYNEPHRINE) 20mg/20m50mL p45mx infusion, 0-400 mcg/min, Intravenous, Titrated, Ouma, ElizabeBing Neighborsast Rate: 105 mL/hr at 12/03/2021 0815, 140 mcg/min at 12/11/2021 0815   polyethylene glycol (MIRALAX / GLYCOLAX) packet 17 g, 17 g, Per Tube, Daily, Keene, Darel Hong 17 g at 11/30/21 0840   sodium bicarbonate 150 mEq in dextrose 5 % 1,150 mL infusion, , Intravenous, Continuous, Singh, Murlean Ibaast Rate: 75 mL/hr at 11/30/2021 0700, Infusion Verify at 11/20/2021 0700   vancomycin (VANCOREADY) IVPB 2000 mg/400 mL, 2,000 mg, Intravenous, Q72H, Grubb, Dallie PilesStopped at 11/30/21 1857   vasopressin (PITRESSIN) 20 Units in sodium chloride 0.9 % 100 mL infusion-*FOR SHOCK*, 0-0.03 Units/min, Intravenous, Continuous, Ouma, ElizabeBing Neighborsast Rate: 9 mL/hr at 12/12/2021 0700, 0.03 Units/min  at 11/27/2021 0700  Labs ABG pH 7.23 A1c 5.1 LDL 76 Anti thrombin 67 L Homocysteine 27.6 H dsDNA <1, C3 107,  C4 17  2D echo IMPRESSION   1. Left ventricular ejection fraction, by estimation, is 55 to 60%. The left ventricle has normal function. The left ventricle has no regional wall motion abnormalities. There is mild concentric left ventricular hypertrophy. Left ventricular diastolic parameters are indeterminate. Elevated left atrial pressure.   2. Right ventricular systolic function is normal. The right ventricular size is normal.   3. Left atrial size was severely dilated.   4. Restricted anterior and posterior mitral valve leaflet motion.  Compared with the echo in 2017, the mitral valve is now heavily thickened and clacified. These findings are all new. Could represent endocarditis.  Recommend TEE to better evaluate. The mitral valve is degenerative. Moderate mitral valve regurgitation. Severe mitral stenosis. Severe mitral annular calcification.   5. Tricuspid valve regurgitation is moderate to severe.   6. The aortic valve is tricuspid. Aortic valve regurgitation is not visualized. No aortic stenosis is present.   7. The inferior vena cava  is normal in size with greater than 50% respiratory variability, suggesting right atrial pressure of 3 mmHg.   Imaging I have reviewed images in epic and the results pertinent to this consultation are: CT head - no acute changes.  No bleed.   MRI brain - scattered infarcts in lateral right frontal lobe, posterior left frontal lobe with patchy acute small infarcts in callosal splenium on right with additional subacute infarcts within left frontoparietal white matter. Chronic hemorrhagic lacunar infarct in left caudate. Chronic white matter small vessel ischemic changes.  Small chronic cortical subcortical infarcts within the right parietal occipital lobes. 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    Foot x-ray concerning  for osteomyelitis. Repeat CT head yesterday-negative for acute process US Venous Img Lower Bilateral  11/29/2021 - negative for evidence of deep venous thrombosis in either lower extremity to the level of the knees. Calf veins are not well seen due to patient body habitus and subcutaneous edema.  Repeat NCT head showed large subacute infarct in right temporal, parietal, and occipital lobes. Subacute infarct in the left central MCA distribution, posteriorly in left frontal lobe. Patchy new white matter hypodensities along internal capsules and periventricular white matter concerning for additional ischemic strokes. No overt intracranial hematoma and no current hydrocephalus or effacement of the basilar cisterns.   Assessment: 36 year old woman extensive vascular risk factors with acute encephalopathy found to have acute strokes. Exam initially encephalopathic with some left leg weakness and mild left facial droop with dysarthria. MRI showed infarcts of different ages not candidate for thrombolysis. Poor mRS precluded thrombectomy. The following day she developed expressive aphasia intact comprehension with slight improvement of left weakness with some right arm drift, b/l LES antigravity but drift to the bed. Follow up NCT head did not show changes compared to prior. Started on heparin per vascular surgery due to concern for right lower extremity thrombus. Given concern for endocarditis clopidogrel stopped (may restart if indicated). COVID-19 (+) infection completed remdesivir. Given the myriad of medical findings, including renal failure, vascular disease and now strokes, hypercoagulable work-up and a rheumatological work-up were ordered. She was noted to have unreactive pupils yesterday and repeat CT head showed new multifocal large territory strokes with scattered smaller areas of stroke as compared to CT head 11/27/2021. She is critically ill in setting of sepsis, gangrene s/p b/l LE amputations with  suspected endocarditis versus thrombus in setting of acute COVID possibly with COVID-19-associated coagulopathy.   Unfortunately, her stroke burden at this point will be severely debilitating with visual field defects, expressive aphasia, bilateral paresis with an increased risk for seizure however cannot predict when or if seizures may occur and this was discussed this on conference call with CCM and her family.   Impression Acute large territory multifocal embolic stroke  Subacute multifocal embolic strokes. Acute encephalopathy - Multifactorial. Septic shock - b/l LE gangrene (s/p b/l amputation), osteomyelitis and COVID. Peripheral vascular disease Mitral valve abnormal on TTE - Concern endocarditis vs MS Severe left atrial dilatation - Question underlying arrhythmia/PAF ESRD on dialysis. Worsening CHF  Plan: Acute embolic strokes: Possibly multifactorial in setting of sepsis with multiorgan failure, b/l LE osteomyelitis and COVID-19-associated coagulopathy. Hypercoagulable panel unrevealing thus far. - SBP goal 100-140/MAP>65 from a neurology standpoint. - Continue heparin stroke protocol - Continue telemetry - Consider CTA head and neck when stable to complete stroke workup.   Septic shock: Likely sepsis b/l LE osteomyelitis, COVID. - Management of antibiotics, ventilator management and pressor support per  CCM.  ESRD: Management and HD per nephrology.  PAD: S/p guillotine amputations both feet. Management per vascular surgery.  Will continue to follow peripherally. Neurology will remain available, please call for questions.   This patient is critically ill and at significant risk of neurological worsening, death and care requires constant monitoring of vital signs, hemodynamics,respiratory and cardiac monitoring, neurological assessment, discussion with family, other specialists and medical decision making of high complexity. I spent 60 minutes of neurocritical care time   in the care of  this patient. This was time spent independent of any time provided by nurse practitioner or PA.  Electronically signed by:  Lynnae Sandhoff, MD Page: [phone] 11/30/2021, 8:17 AM

## 2021-12-01 NOTE — Progress Notes (Addendum)
GOALS OF CARE DISCUSSION  The Clinical status was relayed to family in detail- Mother and Sister at Bedside Family called and asked to come for update to due abnormal CT of head.           Updated and notified of patients medical condition- Patient remains unresponsive and will not open eyes to command.   Patient is having a weak cough and struggling to remove secretions.   Patient with increased WOB and using accessory muscles to breathe Explained to family course of therapy and the modalities   CT head shows severe progression of strokes Based on Discussion with Neurology and Family, patient will have severe permanent brain damage that is irreversible  These new findings is highly suggestive of hypercoagulable state from [place] 19 infection   Patient is suffering and in the dying process   Patient with Progressive multiorgan failure with a very high probablity of a very minimal chance of meaningful recovery despite all aggressive and optimal medical therapy.   Family has consented and agreed to DNR STATUS  Family are satisfied with Plan of action and management. All questions answered  Additional CC time 35 mins   Dashonna Chagnon Patricia Pesa, M.D.  [facility]  Medical Director [place] Director [facility]-P[facility] Department

## 2021-12-01 NOTE — Progress Notes (Signed)
Progress Note  Patient Name: [name] Date of Encounter: 11/30/2021  [facility] Cardiologist: None   Subjective   No acute events overnight, not responding to commands, still requiring pressors for BP support.  Mother and daughter at bedside  Inpatient Medications    Scheduled Meds:  vitamin C  500 mg Per Tube BID   chlorhexidine gluconate (MEDLINE KIT)  15 mL Mouth Rinse BID   Chlorhexidine Gluconate Cloth  6 each Topical Daily   collagenase   Topical Daily   docusate  100 mg Per Tube BID   feeding supplement (PROSource TF)  45 mL Per Tube TID   feeding supplement (VITAL AF 1.2 CAL)  1,000 mL Per Tube Q24H   free water  30 mL Per Tube Q4H   gentamicin cream  1 application Topical Daily   hydrocortisone sod succinate (SOLU-CORTEF) inj  100 mg Intravenous Q8H   insulin aspart  0-6 Units Subcutaneous Q4H   mouth rinse  15 mL Mouth Rinse 10 times per day   multivitamin  1 tablet Per Tube QHS   pantoprazole (PROTONIX) IV  40 mg Intravenous Q24H   polyethylene glycol  17 g Per Tube Daily   sennosides  5 mL Per Tube BID   Continuous Infusions:  sodium chloride Stopped (12/14/2021 0747)   ceFEPime (MAXIPIME) IV 1 g (12/09/2021 1014)   dialysis solution 1.5% low-MG/low-CA     fentaNYL infusion INTRAVENOUS 200 mcg/hr (11/23/2021 1152)   heparin Stopped (12/13/2021 1143)   metronidazole Stopped (11/20/2021 0122)   midazolam 4 mg/hr (12/07/2021 1154)   norepinephrine (LEVOPHED) Adult infusion 18 mcg/min (12/05/2021 1125)   phenylephrine (NEO-SYNEPHRINE) Adult infusion 100 mcg/min (11/25/2021 1132)   sodium bicarbonate 150 mEq in D5W infusion 100 mL/hr at 11/21/2021 0837   vancomycin Stopped (11/30/21 1857)   vasopressin 0.03 Units/min (12/10/2021 1125)   PRN Meds: sodium chloride, acetaminophen **OR** acetaminophen (TYLENOL) oral liquid 160 mg/5 mL **OR** acetaminophen, fentaNYL, fentaNYL (SUBLIMAZE) injection   Vital Signs    Vitals:   12/06/2021 0700 12/07/2021 0715 12/12/2021 0800  11/22/2021 0835  BP:      Pulse: 93 94 87 88  Resp: 15 15 15 17   Temp: (!) 97 F (36.1 C) (!) 97 F (36.1 C) (!) 97.3 F (36.3 C) (!) 96.8 F (36 C)  TempSrc:      SpO2: 94% 94% 96% 97%  Weight:      Height:        Intake/Output Summary (Last 24 hours) at 11/30/2021 1352 Last data filed at 12/07/2021 1244 Gross per 24 hour  Intake 4175.91 ml  Output 50 ml  Net 4125.91 ml   Last 3 Weights 11/30/2021 11/30/2021 11/28/2021  Weight (lbs) 290 lb 5.5 oz 290 lb 5.5 oz 289 lb 14.5 oz  Weight (kg) 131.7 kg 131.7 kg 131.5 kg      Telemetry    Sinus rhythm- Personally Reviewed  ECG     - Personally Reviewed  Physical Exam   GEN: Intubated Neck: No JVD Cardiac: RRR, no murmurs, rubs, or gallops.  Respiratory: Vented breath sounds GI: Soft, nontender, non-distended  MS: Left foot amputation, right to gangrene noted Neuro: Unable to assess Psych: Unable to assess  Labs    High Sensitivity Troponin:   Recent Labs  Lab 11/25/2021 0338 11/25/2021 0532  TROPONINIHS 269* 269*     Chemistry Recent Labs  Lab 11/29/21 0342 11/30/21 0314 11/20/2021 0338 11/19/2021 0601  NA 134* 135 132*  --   K  3.5 3.6 3.7  --   CL 93* 95* 93*  --   CO2 15* 20* 18*  --   GLUCOSE 152* 128* 154*  --   BUN 64* 56* 55*  --   CREATININE 11.44* 10.89* 10.30*  --   CALCIUM 7.8* 8.1* 8.1*  --   MG 1.6* 2.0  --  1.8  PROT 6.6 6.8 6.8  --   ALBUMIN 2.7* 2.7* 2.5*  --   AST 12* 10* 20  --   ALT <5 5 6   --   ALKPHOS 132* 129* 135*  --   BILITOT 1.0 0.6 0.9  --   GFRNONAA 4* 4* 5*  --   ANIONGAP 26* 20* 21*  --     Lipids  Recent Labs  Lab 12/06/2021 1830 11/30/21 0315  CHOL 125  --   TRIG 73 125  HDL 34*  --   LDLCALC 76  --   CHOLHDL 3.7  --     Hematology Recent Labs  Lab 11/29/21 0342 11/30/21 0430 11/23/2021 0338  WBC 23.3* 30.5* 36.5*  RBC 2.75* 2.84* 2.94*  HGB 8.5* 8.8* 9.2*  HCT 27.5* 28.9* 30.1*  MCV 100.0 101.8* 102.4*  MCH 30.9 31.0 31.3  MCHC 30.9 30.4 30.6  RDW 18.0*  18.2* 18.2*  PLT 274 267 253   Thyroid No results for input(s): TSH, FREET4 in the last 168 hours.  BNP Recent Labs  Lab 11/30/2021 0338  BNP 1,566.6*    DDimer  Recent Labs  Lab 11/27/21 0528 11/30/21 0314 12/07/2021 0338  DDIMER 0.61* 2.94* 2.63*     Radiology    DG Chest 1 View  Result Date: 11/28/2021 CLINICAL DATA:  Central line placement EXAM: CHEST  1 VIEW COMPARISON:  11/25/2021 at 3:47 a.m. FINDINGS: Endotracheal tube tip is somewhat indistinct but thought to be about 2.4 cm above the carina. Nasogastric tube indistinct but appears to enter the stomach. A left internal jugular central venous catheter is present tip projecting over the SVC. Low lung volumes are present, causing crowding of the pulmonary vasculature. Stable indistinct airspace opacity at the lung bases, right greater than left. The patient is rotated to the left on today's radiograph, reducing diagnostic sensitivity and specificity. IMPRESSION: 1. Left central line tip projects over the SVC. No visible pneumothorax. 2. Stable bibasilar airspace opacities, right greater than left. Low lung volumes. Electronically Signed   By: Van Clines M.D.   On: 12/09/2021 13:11   DG Chest 1 View  Result Date: 12/09/2021 CLINICAL DATA:  Pneumothorax EXAM: CHEST  1 VIEW COMPARISON:  11/30/2021 FINDINGS: Endotracheal tube and NG tube remain in place unchanged. Cardiomegaly with vascular congestion. Bilateral airspace opacities slightly worsened since prior study, likely worsening edema. Decreasing lung volumes. No effusions. No acute bony abnormality. IMPRESSION: Cardiomegaly with mild vascular congestion and edema/CHF, slightly worsening since prior study. Low lung volumes. Electronically Signed   By: Rolm Baptise M.D.   On: 12/09/2021 03:55   DG Chest Port 1 View  Result Date: 11/30/2021 CLINICAL DATA:  Acute respiratory failure with hypoxia EXAM: PORTABLE CHEST 1 VIEW COMPARISON:  Chest radiograph from one day prior.  FINDINGS: Endotracheal tube tip is 2.0 cm above the carina. Enteric tube enters stomach with the tip not seen on this image. Stable cardiomediastinal silhouette with mild to moderate cardiomegaly. No pneumothorax. No pleural effusion. Low lung volumes. Mild pulmonary edema, improved. IMPRESSION: 1. Well-positioned support structures. 2. Mild congestive heart failure, improved. 3. Low lung volumes. Electronically Signed  By: Ilona Sorrel M.D.   On: 11/30/2021 08:08    Cardiac Studies   TTE 11/2021  1. Left ventricular ejection fraction, by estimation, is 55 to 60%. The  left ventricle has normal function. The left ventricle has no regional  wall motion abnormalities. There is mild concentric left ventricular  hypertrophy. Left ventricular diastolic  parameters are indeterminate. Elevated left atrial pressure.   2. Right ventricular systolic function is normal. The right ventricular  size is normal.   3. Left atrial size was severely dilated.   4. Restricted anterior and posterior mitral valve leaflet motion.  Compared with the echo in 2017, the mitral valve is now heavily thickened  and clacified. These findings are all new. Could represent endocarditis.  Recommend TEE to better evaluate. The  mitral valve is degenerative. Moderate mitral valve regurgitation. Severe  mitral stenosis. Severe mitral annular calcification.   5. Tricuspid valve regurgitation is moderate to severe.   6. The aortic valve is tricuspid. Aortic valve regurgitation is not  visualized. No aortic stenosis is present.   7. The inferior vena cava is normal in size with greater than 50%  respiratory variability, suggesting right atrial pressure of 3 mmHg.   Patient Profile     36 y.o. female female with a history of end-stage renal disease on PD, lower extremity gangrene, obesity, hypertension, HFpEF presenting for peripheral vascular procedure, became altered, and left-sided weakness, diagnosed with acute stroke.   Echocardiogram showed mitral valve abnormality/calcification being seen for possible endocarditis.  Assessment & Plan    Mitral valve abnormality -Mitral valve appears calcified, gradient 15 mmHg at heart rate 108 -Unclear if this represents calcification or infection -Patient still unstable for TEE.  She still requires pressors. -Even if endocarditis is diagnosed, this would not change management plan including IV antibiotics. -She is also not a surgical candidate at this point.  2.  End-stage renal disease on PD -Management as per nephrology  3.  Respiratory failure, hypotension, COVID-19 infection -Intubated, on pressors as per ICU team -On antibiotics  4.  Peripheral arterial disease, foot gangrene -Management as per vascular team.  Total encounter time more than 50 minutes  Greater than 50% was spent in counseling and coordination of care with ICU team.  Discussed with patient's mother and daughter at bedside     Signed, Kate Sable, MD  11/24/2021, 1:52 PM

## 2021-12-01 NOTE — Progress Notes (Signed)
Patient taken to OR by Kirtland Bouchard, RN and OR staff.

## 2021-12-01 NOTE — Progress Notes (Signed)
Patient transported to CT by this RN, Bambi, cardiopulmonary, and orderly.

## 2021-12-01 NOTE — Progress Notes (Addendum)
Patient returned to ICU bed 5.  All monitoring devices in place.  Vital signs remain stable.  Patient still unresponsive.  Fentanyl drip turned off to assess neurological status.

## 2021-12-01 NOTE — Consult Note (Signed)
[place] for heparin Indication:  Thrombus  Allergies  Allergen Reactions   Percocet [Oxycodone-Acetaminophen] Itching   Tomato Itching   Gabapentin Other (See Comments)    Extremity tremors    Patient Measurements: Height: 5\' 6"  (167.6 cm) Weight: 131.7 kg (290 lb 5.5 oz) IBW/kg (Calculated) : 59.3 Heparin Dosing Weight: 92.3 Kg  Vital Signs: Temp: 96.8 F (36 C) (02/15 0835) Temp Source: Esophageal (02/15 0400) Pulse Rate: 88 (02/15 0835)  Labs: Recent Labs    11/29/21 0342 11/29/21 0916 11/30/21 0314 11/30/21 0430 11/30/21 1339 11/30/21 2226 11/21/2021 0338 12/14/2021 0532 12/08/2021 0601  HGB 8.5*  --   --  8.8*  --   --  9.2*  --   --   HCT 27.5*  --   --  28.9*  --   --  30.1*  --   --   PLT 274  --   --  267  --   --  253  --   --   HEPARINUNFRC  --    < > 0.51  --  0.32 0.26*  --   --  0.24*  CREATININE 11.44*  --  10.89*  --   --   --  10.30*  --   --   CKTOTAL  --   --   --   --   --   --   --   --  913*  TROPONINIHS  --   --   --   --   --   --  269* 269*  --    < > = values in this interval not displayed.     Estimated Creatinine Clearance: 10.5 mL/min (A) (by C-G formula based on SCr of 10.3 mg/dL (H)).   Medical History: Past Medical History:  Diagnosis Date   Anemia of chronic disease    Asthma    well controlled-no inhaler   Calciphylaxis 2020   CHF (congestive heart failure) ([place])    CKD (chronic kidney disease), stage III ([place])    Diabetes ([place])    type 2-diet controlled   Dyspnea    Hypertension    Morbid obesity ([place])    Nephrotic syndrome    PAD (peripheral artery disease) ([place])    Sepsis ([place]) 06/2021    Medications:  No chronic anticoagulation PTA Heparin Dosing Weight: 92.3 Kg  Assessment: Pharmacy has been consulted to initiate and monitor heparin in 36yo patient admitted with stroke-like symptoms. Her symptoms began to resolve, but neurologist noted concern for lower extremity  ischemia, likely embolic. H&H, platelets have been stable. Heparin was stopped today for Bilateral guillotine amputations below the knee  Goal of Therapy:  Heparin level 0.3-0.5 units/ml (Per Stroke Protocol) Monitor platelets by anticoagulation protocol: Yes  Date Time HL  Comment/rate 2/10 2222 HL < 0.10, subtherapeutic 2/11 0906 HL 0.12, subtherapeutic 2/11 2048 HL 0.22, subtherapeutic  2/12 0602 HL 0.44, therapeutic x 1 2/12 1500 HL 0.29, subtherapeutic; 2100>2200 un/hr 2/12 2356 HL 0.36, therapeutic x 1; 2200 un/hr 2/13 0916 HL 0.40, therapeutic x2; 2200 un/hr 2/14 0314 HL 0.51: 2200 >> 2100 units/hr 2/14 1339 HL 0.32: 2100 units/hr 2/14 2226 HL 0.26: 2100 >>2200 units/hr 2/15 0601 HL 0.24  2200 >>2300 units/hr   Plan:  Surgery has been completed: Will restart heparin infusion at 2300 units/hr recheck level 8 hrs after restarting  Daily CBC while on IV heparin  Dallie Piles Clinical Pharmacist 11/21/2021 2:23 PM

## 2021-12-01 NOTE — Consult Note (Signed)
PHARMACY NOTE:  ANTIMICROBIAL RENAL DOSAGE ADJUSTMENT  Current antimicrobial regimen includes a mismatch between antimicrobial dosage and estimated renal function.  As per policy approved by the Pharmacy & Therapeutics and Medical Executive Committees, the antimicrobial dosage will be adjusted accordingly.  Current antimicrobial dosage:  Cefepime 1 g IV q24h & vancomycin 2 g IV q72h (ESRD on PD)  Indication: Wound infection / ischemic feet s/p BL BKA  Renal Function:  Estimated Creatinine Clearance: 10.3 mL/min (A) (by C-G formula based on SCr of 10.56 mg/dL (H)). []      On intermittent HD, scheduled: [x]      On CRRT >> started 2/15 PM    Antimicrobial dosage has been changed to:  Cefepime 2 g IV q12h & vancomycin 1500 mg IV q24h  Additional comments: Continue to follow RRT plan and adjust antibiotics as indicated   Thank you for allowing pharmacy to be a part of this patient's care.  Benita Gutter, [facility] 11/27/2021 8:27 PM

## 2021-12-01 NOTE — Progress Notes (Signed)
Attempted to place right femoral arterial line, however unable to thread guidewire.    Rosilyn Mings, AGNP  Pulmonary/Critical Care Pager [phone] (please enter 7 digits) PCCM Consult Pager [phone] (please enter 7 digits)

## 2021-12-01 NOTE — Progress Notes (Signed)
Donell Beers, NP and Dr. Mortimer Fries having family meeting with patient mother and sister.

## 2021-12-01 NOTE — Consult Note (Signed)
[place] for heparin Indication:  Thrombus  Allergies  Allergen Reactions   Percocet [Oxycodone-Acetaminophen] Itching   Tomato Itching   Gabapentin Other (See Comments)    Extremity tremors    Patient Measurements: Height: 5\' 6"  (167.6 cm) Weight: 131.7 kg (290 lb 5.5 oz) IBW/kg (Calculated) : 59.3 Heparin Dosing Weight: 92.3 Kg  Vital Signs: Temp: 97.2 F (36.2 C) (02/15 0630) Temp Source: Esophageal (02/15 0400) Pulse Rate: 92 (02/15 0630)  Labs: Recent Labs    11/29/21 0342 11/29/21 0916 11/30/21 0314 11/30/21 0430 11/30/21 1339 11/30/21 2226 11/25/2021 0338 12/14/2021 0532 12/03/2021 0601  HGB 8.5*  --   --  8.8*  --   --  9.2*  --   --   HCT 27.5*  --   --  28.9*  --   --  30.1*  --   --   PLT 274  --   --  267  --   --  253  --   --   HEPARINUNFRC  --    < > 0.51  --  0.32 0.26*  --   --  0.24*  CREATININE 11.44*  --  10.89*  --   --   --  10.30*  --   --   TROPONINIHS  --   --   --   --   --   --  269* 269*  --    < > = values in this interval not displayed.     Estimated Creatinine Clearance: 10.5 mL/min (A) (by C-G formula based on SCr of 10.3 mg/dL (H)).   Medical History: Past Medical History:  Diagnosis Date   Anemia of chronic disease    Asthma    well controlled-no inhaler   Calciphylaxis 2020   CHF (congestive heart failure) ([place])    CKD (chronic kidney disease), stage III ([place])    Diabetes ([place])    type 2-diet controlled   Dyspnea    Hypertension    Morbid obesity ([place])    Nephrotic syndrome    PAD (peripheral artery disease) ([place])    Sepsis ([place]) 06/2021    Medications:  No chronic anticoagulation PTA Heparin Dosing Weight: 92.3 Kg  Assessment: Pharmacy has been consulted to initiate and monitor heparin in 35yo patient admitted with stroke-like symptoms. Her symptoms began to resolve, but neurologist noted concern for lower extremity ischemia, likely embolic.   Goal of Therapy:  Heparin  level 0.3-0.5 units/ml (Per Stroke Protocol) Monitor platelets by anticoagulation protocol: Yes  Date Time HL  Comment/rate 2/10 2222 HL < 0.10, subtherapeutic 2/11 0906 HL 0.12, subtherapeutic 2/11 2048 HL 0.22, subtherapeutic  2/12 0602 HL 0.44, therapeutic x 1 2/12 1500 HL 0.29, subtherapeutic; 2100>2200 un/hr 2/12 2356 HL 0.36, therapeutic x 1; 2200 un/hr 2/13 0916 HL 0.40, therapeutic x2; 2200 un/hr 2/14 0314 HL 0.51: 2200 >> 2100 units/hr 2/14 1339 HL 0.32: 2100 units/hr 2/14 2226 HL 0.26: 2100 >>2200 units/hr 2/15 0601 HL 0.24  2200 >>2300 units/hr   Plan:  Heparin level remains SUBtherapeutic despite recent rate increase Will increase drip rate to 2300 units/hr and recheck level 8 hrs after rate change.  Daily CBC while on IV heparin  Dallie Piles Clinical Pharmacist 12/07/2021 7:02 AM

## 2021-12-01 NOTE — Progress Notes (Signed)
[facility]  ROUNDING NOTE   Subjective:   [name] was admitted to [facility] on 11/17/2021 for PVD (peripheral vascular disease) ([place]) [I73.9] Pain [R52] Dysarthria [R47.1] Acute CVA (cerebrovascular accident) [facility]) [I63.9] Cerebrovascular 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 ([place]) [I63.40] COVID-19 [U07.1]  Patient remains critically ill, intubated and sedated.  Now requiring 3 pressors.  Overnight clinical condition has worsened.  She was able to tolerate peritoneal dialysis.  Multidisciplinary family meeting was held and it was decided to continue aggressive care.    Objective:  Vital signs in last 24 hours:  Temp:  [96.4 F (35.8 C)-99.1 F (37.3 C)] 96.8 F (36 C) (02/15 0835) Pulse Rate:  [83-101] 88 (02/15 0835) Resp:  [7-28] 17 (02/15 0835) SpO2:  [88 %-100 %] 97 % (02/15 0835) Arterial Line BP: (80-120)/(41-63) 104/48 (02/15 0835) FiO2 (%):  [30 %-45 %] 40 % (02/15 0835) Weight:  [131.7 kg] 131.7 kg (02/14 1809)  Weight change: 0 kg Filed Weights   11/28/21 0500 11/30/21 0500 11/30/21 1809  Weight: 131.5 kg 131.7 kg 131.7 kg    Intake/Output: I/O last 3 completed shifts: In: 20465 [I.V.:3996; Other:15030; NG/GT:539; IV Piggyback:900] Out: 02542 [Other:14616]   Intake/Output this shift:  Total I/O In: 242.3 [I.V.:242.3] Out: -   Physical Exam: General: Laying in bed  Head: ET tube in place  Lungs:  Ventilator assisted,  Heart: Regular  Abdomen:  Soft, nontender, PD catheter, obese  Extremities:  no peripheral edema. Left foot in dressings  Neurologic: Sedated  Skin: Warm  Access: PD catheter  Right femoral temporary dialysis catheter 11/29/2021.  Dr. [name]  Basic Metabolic Panel: Recent Labs  Lab 11/27/21 7062 11/28/21 0602 11/29/21 3762 11/30/21 0314 12/14/2021 0338 11/23/2021 0601  NA 138 136 134* 135 132*  --   K 4.1 4.0 3.5 3.6 3.7  --   CL 94* 93* 93* 95* 93*  --   CO2 17* 18* 15* 20* 18*  --   GLUCOSE 98  108* 152* 128* 154*  --   BUN 67* 67* 64* 56* 55*  --   CREATININE 12.01* 11.50* 11.44* 10.89* 10.30*  --   CALCIUM 8.2* 7.9* 7.8* 8.1* 8.1*  --   MG  --  1.5* 1.6* 2.0  --  1.8  PHOS  --  10.0* 9.3* 10.8*  --   --      Liver Function Tests: Recent Labs  Lab 11/27/21 0528 11/28/21 0602 11/29/21 0342 11/30/21 0314 11/23/2021 0338  AST 11* 11* 12* 10* 20  ALT 6 6 <_0 ALKPHOS 124 120 132* 129* 135*  BILITOT 0.7 1.0 1.0 0.6 0.9  PROT 6.6 6.5 6.6 6.8 6.8  ALBUMIN 2.7* 2.8* 2.7* 2.7* 2.5*    No results for input(s): LIPASE, AMYLASE in the last 168 hours. No results for input(s): AMMONIA in the last 168 hours.  CBC: Recent Labs  Lab 11/17/2021 1320 12/12/2021 1412 11/27/21 0528 11/28/21 0602 11/29/21 0342 11/30/21 0430 12/11/2021 0338  WBC 17.2* 16.6* 14.4* 17.4* 23.3* 30.5* 36.5*  NEUTROABS 14.0* 14.0* 11.8*  --   --   --   --   HGB 9.2* 9.4* 8.6* 8.6* 8.5* 8.8* 9.2*  HCT 28.4* 29.6* 27.4* 27.4* 27.5* 28.9* 30.1*  MCV 99.6 98.7 100.4* 100.4* 100.0 101.8* 102.4*  PLT 280 247 242 253 274 267 253     Cardiac Enzymes: Recent Labs  Lab 12/08/2021 0601  CKTOTAL 913*    BNP: Invalid input(s): POCBNP  CBG: Recent Labs  Lab 11/20/2021 0003 11/20/2021 0217 11/27/2021 0356 11/22/2021 0633 11/28/2021 0831  GLUCAP 141* 133* 151* 123* 127*     Microbiology: Results for orders placed or performed during the hospital encounter of 11/20/2021  Resp Panel by RT-PCR (Flu A&B, Covid) Nasopharyngeal Swab     Status: Abnormal   Collection Time: 11/21/2021  2:12 PM   Specimen: Nasopharyngeal Swab; Nasopharyngeal(NP) swabs in vial transport medium  Result Value Ref Range Status   SARS Coronavirus 2 by RT PCR POSITIVE (A) NEGATIVE Final    Comment: (NOTE) SARS-CoV-2 target nucleic acids are DETECTED.  The SARS-CoV-2 RNA is generally detectable in upper respiratory specimens during the acute phase of infection. Positive results are indicative of the presence of the identified virus, but  do not rule out bacterial infection or co-infection with other pathogens not detected by the test. Clinical correlation with patient history and other diagnostic information is necessary to determine patient infection status. The expected result is Negative.  Fact Sheet for Patients: EntrepreneurPulse.com.au  Fact Sheet for Healthcare Providers: IncredibleEmployment.be  This test is not yet approved or cleared by the [place] FDA and  has been authorized for detection and/or diagnosis of SARS-CoV-2 by FDA under an Emergency Use Authorization (EUA).  This EUA will remain in effect (meaning this test can be used) for the duration of  the COVID-19 declaration under Section 564(b)(1) of the A ct, 21 U.S.C. section 360bbb-3(b)(1), unless the authorization is terminated or revoked sooner.     Influenza A by PCR NEGATIVE NEGATIVE Final   Influenza B by PCR NEGATIVE NEGATIVE Final    Comment: (NOTE) The Xpert Xpress SARS-CoV-2/FLU/RSV plus assay is intended as an aid in the diagnosis of influenza from Nasopharyngeal swab specimens and should not be used as a sole basis for treatment. Nasal washings and aspirates are unacceptable for Xpert Xpress SARS-CoV-2/FLU/RSV testing.  Fact Sheet for Patients: EntrepreneurPulse.com.au  Fact Sheet for Healthcare Providers: IncredibleEmployment.be  This test is not yet approved or cleared by the [place] FDA and has been authorized for detection and/or diagnosis of SARS-CoV-2 by FDA under an Emergency Use Authorization (EUA). This EUA will remain in effect (meaning this test can be used) for the duration of the COVID-19 declaration under Section 564(b)(1) of the Act, 21 U.S.C. section 360bbb-3(b)(1), unless the authorization is terminated or revoked.  Performed at [facility], [address] [place], [place] [address]   CULTURE, BLOOD (ROUTINE X 2) w  Reflex to ID Panel     Status: None (Preliminary result)   Collection Time: 11/27/21 10:32 AM   Specimen: BLOOD  Result Value Ref Range Status   Specimen Description BLOOD BLOOD RIGHT HAND  Final   Special Requests   Final    BOTTLES DRAWN AEROBIC AND ANAEROBIC Blood Culture adequate volume   Culture   Final    NO GROWTH 4 DAYS Performed at [facility], [address], [place], [place] [address]    Report Status PENDING  Incomplete  CULTURE, BLOOD (ROUTINE X 2) w Reflex to ID Panel     Status: None (Preliminary result)   Collection Time: 11/27/21 10:32 AM   Specimen: BLOOD  Result Value Ref Range Status   Specimen Description BLOOD BLOOD RIGHT ARM  Final   Special Requests   Final    BOTTLES DRAWN AEROBIC AND ANAEROBIC Blood Culture adequate volume   Culture   Final    NO GROWTH 4 DAYS Performed at [facility],  [place], [place] [address]    Report Status PENDING  Incomplete  MRSA Next Gen by PCR, Nasal     Status: None   Collection Time: 11/27/21  4:24 PM   Specimen: Nasal Mucosa; Nasal Swab  Result Value Ref Range Status   MRSA by PCR Next Gen NOT DETECTED NOT DETECTED Final    Comment: (NOTE) The GeneXpert MRSA Assay (FDA approved for NASAL specimens only), is one component of a comprehensive MRSA colonization surveillance program. It is not intended to diagnose MRSA infection nor to guide or monitor treatment for MRSA infections. Test performance is not FDA approved in patients less than 7 years old. Performed at [facility], [address] [place], [place] [address]   CULTURE, BLOOD (ROUTINE X 2) w Reflex to ID Panel     Status: None (Preliminary result)   Collection Time: 11/28/21 11:42 AM   Specimen: BLOOD  Result Value Ref Range Status   Specimen Description BLOOD BLOOD RIGHT HAND  Final   Special Requests   Final    BOTTLES DRAWN AEROBIC AND ANAEROBIC Blood Culture adequate volume   Culture   Final    NO GROWTH 3  DAYS Performed at [facility], [address] [place], [place] [address]    Report Status PENDING  Incomplete  CULTURE, BLOOD (ROUTINE X 2) w Reflex to ID Panel     Status: None (Preliminary result)   Collection Time: 11/28/21 11:42 AM   Specimen: BLOOD  Result Value Ref Range Status   Specimen Description BLOOD RIGHT ANTECUBITAL  Final   Special Requests   Final    AEROBIC BOTTLE ONLY Blood Culture results may not be optimal due to an inadequate volume of blood received in culture bottles   Culture   Final    NO GROWTH 3 DAYS Performed at [facility], [address] [place], [place] [address]    Report Status PENDING  Incomplete  Wet prep, genital     Status: Abnormal   Collection Time: 11/29/21  9:16 AM   Specimen: Vaginal  Result Value Ref Range Status   Yeast Wet Prep HPF POC NONE SEEN NONE SEEN Final   Trich, Wet Prep NONE SEEN NONE SEEN Final   Clue Cells Wet Prep HPF POC PRESENT (A) NONE SEEN Final   WBC, Wet Prep HPF POC >=10 (A) <10 Final   Sperm NONE SEEN  Final    Comment: Performed at [facility], [address] [place], [place] [address]    Coagulation Studies: No results for input(s): LABPROT, INR in the last 72 hours.   Urinalysis: No results for input(s): COLORURINE, LABSPEC, PHURINE, GLUCOSEU, HGBUR, BILIRUBINUR, KETONESUR, PROTEINUR, UROBILINOGEN, NITRITE, LEUKOCYTESUR in the last 72 hours.  Invalid input(s): APPERANCEUR    Imaging: DG Chest 1 View  Result Date: 12/12/2021 CLINICAL DATA:  Pneumothorax EXAM: CHEST  1 VIEW COMPARISON:  11/30/2021 FINDINGS: Endotracheal tube and NG tube remain in place unchanged. Cardiomegaly with vascular congestion. Bilateral airspace opacities slightly worsened since prior study, likely worsening edema. Decreasing lung volumes. No effusions. No acute bony abnormality. IMPRESSION: Cardiomegaly with mild vascular congestion and edema/CHF, slightly worsening since prior study. Low lung  volumes. Electronically Signed   By: Rolm Baptise M.D.   On: 12/05/2021 03:55   DG Chest Port 1 View  Result Date: 11/30/2021 CLINICAL DATA:  Acute respiratory failure with hypoxia EXAM: PORTABLE CHEST 1 VIEW COMPARISON:  Chest radiograph from one day prior. FINDINGS: Endotracheal  tube tip is 2.0 cm above the carina. Enteric tube enters stomach with the tip not seen on this image. Stable cardiomediastinal silhouette with mild to moderate cardiomegaly. No pneumothorax. No pleural effusion. Low lung volumes. Mild pulmonary edema, improved. IMPRESSION: 1. Well-positioned support structures. 2. Mild congestive heart failure, improved. 3. Low lung volumes. Electronically Signed   By: Ilona Sorrel M.D.   On: 11/30/2021 08:08     Medications:    [MAR Hold] sodium chloride Stopped (11/27/2021 0747)   [MAR Hold] ceFEPime (MAXIPIME) IV 1 g (11/22/2021 1014)   [MAR Hold] dialysis solution 1.5% low-MG/low-CA     fentaNYL infusion INTRAVENOUS 200 mcg/hr (11/22/2021 1152)   heparin Stopped (11/28/2021 1143)   [MAR Hold] metronidazole Stopped (11/21/2021 0122)   midazolam 4 mg/hr (11/27/2021 1154)   [MAR Hold] norepinephrine (LEVOPHED) Adult infusion 18 mcg/min (11/27/2021 1125)   [MAR Hold] phenylephrine (NEO-SYNEPHRINE) Adult infusion 100 mcg/min (12/09/2021 1132)   sodium bicarbonate 150 mEq in D5W infusion 100 mL/hr at 11/18/2021 0837   [MAR Hold] vancomycin Stopped (11/30/21 1857)   vasopressin 0.03 Units/min (11/28/2021 1125)    [MAR Hold] vitamin C  500 mg Per Tube BID   [MAR Hold] chlorhexidine gluconate (MEDLINE KIT)  15 mL Mouth Rinse BID   [MAR Hold] Chlorhexidine Gluconate Cloth  6 each Topical Daily   [MAR Hold] collagenase   Topical Daily   [MAR Hold] docusate  100 mg Per Tube BID   [MAR Hold] feeding supplement (PROSource TF)  45 mL Per Tube TID   [MAR Hold] feeding supplement (VITAL AF 1.2 CAL)  1,000 mL Per Tube Q24H   [MAR Hold] free water  30 mL Per Tube Q4H   [MAR Hold] gentamicin cream  1 application  Topical Daily   [MAR Hold] hydrocortisone sod succinate (SOLU-CORTEF) inj  100 mg Intravenous Q8H   [MAR Hold] insulin aspart  0-6 Units Subcutaneous Q4H   [MAR Hold] mouth rinse  15 mL Mouth Rinse 10 times per day   [MAR Hold] multivitamin  1 tablet Per Tube QHS   [MAR Hold] pantoprazole (PROTONIX) IV  40 mg Intravenous Q24H   [MAR Hold] polyethylene glycol  17 g Per Tube Daily   [MAR Hold] sennosides  5 mL Per Tube BID   [MAR Hold] sodium chloride, [MAR Hold] acetaminophen **OR** [MAR Hold] acetaminophen (TYLENOL) oral liquid 160 mg/5 mL **OR** [MAR Hold] acetaminophen, [MAR Hold] fentaNYL, [MAR Hold] fentaNYL (SUBLIMAZE) injection  Assessment/ Plan:  [name] is a 36 y.o. black female with end stage renal disease on peritoneal dialysis, hypertension, congestive heart failure, diabetes mellitus type II, peripheral vascular disease who is admitted to [facility] on 11/25/2021 for PVD (peripheral vascular disease) ([place]) [I73.9] Pain [R52] Dysarthria [R47.1] Acute CVA (cerebrovascular accident) ([place]) [I63.9] Cerebrovascular 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 ([place]) [I63.40] COVID-19 [U07.1]  COVID-19 + Patient was admitted as a code stroke. She was scheduled for debridement of her left foot wound later that day. Procedure was cancelled.   [facility]itoneal [facility] Shanon Payor 132kg CCPD 9 hours 5 exchanges 304m fills and last fill of 20077m End Stage Renal Disease: Peritoneal dialysis treatment last night. Tolerated treatment well.  Discussed with family that we would like to switch to CRRT for aggressive electrolytes and volume management.  We will place PD on hold and initiate CRRT after patient returns from the OR  Anemia of kidney disease:   Lab Results  Component Value Date   HGB 9.2 (L) 11/25/2021  Hold off on EPO due to ischemic event.   Secondary Hyperparathyroidism with hyperphosphatemia and calciphylaxis: Labs from 2/7: PTH 1882, calcium 8 and phos  8.1. was getting sodium thiosulfate as outpatient with peritoneal dialysis. - When patient is able to take PO, recommend restarting binders.  4. 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 Hypoglycemia on 2/10. Hemoglobin A1c of 5.1% on admission - monitor blood sugars  5. Altered mental status: with concerns for acute ischemic stroke.   MRI shows multiple chronic vessel small ischemic changes and lacunar infarcts concerning for embolic disease.  2D echo from November 27, 2021-LVEF 55 to 60%, mild LVH, elevated left atrial pressure and severe dilatation, restricted anterior and posterior mitral valve leaflet motion, mitral valve heavily thickened and calcified, could represent endocarditis, severe mitral stenosis, severe mitral annular calcifications, moderate to severe tricuspid regurgitation -Currently on broad-spectrum antibiotics for possible mitral valve endocarditis -Continue on heparin for embolic stroke  6. Peripheral vascular disease and left foot osteomyelitis causing sepsis: Antibiotics as per podiatry and vascular.  Plan for bilateral guillotine amputation or infected lower limb  7. COVID-19 infection: placed on isolation and remdesivir. Support care.   8.  Acute respiratory failure Intubated 11/29/2021 Management as per ICU team     LOS: [place] 2/15/202312:08 PM

## 2021-12-01 NOTE — Progress Notes (Signed)
Donell Beers, NP at bedside assessing patient.  Hinton Dyer aware of being unable to doppler pedal pulses.

## 2021-12-01 NOTE — Procedures (Signed)
Central line  Date/Time: 12/05/2021 12:33 PM Performed by: Elmore Guise, MD Authorized by: Elmore Guise, MD   Consent:    Consent obtained:  Emergent situation   Consent given by:  Healthcare agent   Risks, benefits, and alternatives were discussed: yes     Alternatives discussed:  No treatment Universal protocol:    Patient identity confirmed:  Hospital-assigned identification number Pre-procedure details:    Indication(s): central venous access     Hand hygiene: Hand hygiene performed prior to insertion     Sterile barrier technique: All elements of maximal sterile technique followed     Skin preparation:  Chlorhexidine   Skin preparation agent: Skin preparation agent completely dried prior to procedure   Sedation:    Sedation type:  Deep Anesthesia:    Anesthesia method:  None Procedure details:    Location:  L internal jugular   Site selection rationale:  Left Internal Jugular.  Patient intubated and procedure performed in the operating room   Patient position:  Supine   Procedural supplies:  Triple lumen   Catheter size:  7 Fr   Ultrasound guidance: yes     Ultrasound guidance timing: prior to insertion     Sterile ultrasound techniques: Sterile gel and sterile probe covers were used     Number of attempts:  1   Successful placement: yes   Post-procedure details:    Post-procedure:  Line sutured and dressing applied   Assessment:  Blood return through all ports   Procedure completion:  Tolerated   Complications:  None

## 2021-12-01 NOTE — Progress Notes (Signed)
Donell Beers, NP notified that pupils do not appear to be reactive.  Versed drip turned off to assess patients neurological status.

## 2021-12-01 NOTE — Progress Notes (Signed)
Patient noted with vent dyssynchrony overnight not amenable to vent changes or increase in sedation. PRN Vecuronium administered x 1 one with improvement however her BP dropped requiring higher doses of Levo and addition of second pressor. Chest xray this am shows vascular congestion and edema/CHF, slightly worsening since prior study otherwise no pneumothorax.    PLAN -Follow Repeat labs, troponin, Lactate, CMP, CBC, BNP, and Inflammatory markers -Arterial Blood Gas result:  pO2 113; pCO2 49; pH 7.22;  HCO3 20, %O2 Sat 99. -EKG      Rufina Falco, DNP, CCRN, FNP-C, AGACNP-BC Acute Care Nurse Practitioner  [facility] Pager: [phone] [facility]

## 2021-12-01 NOTE — Progress Notes (Signed)
[facility]  Daily Progress Note   Subjective  -   Patient remains intubated on 3 pressors with severe multisystem organ failure.  Unable to provide history.  Critical care service is concerned that her ischemic feet are driving this response.  Objective Vitals:   11/25/2021 0700 11/20/2021 0715 11/18/2021 0800 12/07/2021 0835  BP:      Pulse: 93 94 87 88  Resp: 15 15 15 17   Temp: (!) 97 F (36.1 C) (!) 97 F (36.1 C) (!) 97.3 F (36.3 C) (!) 96.8 F (36 C)  TempSrc:      SpO2: 94% 94% 96% 97%  Weight:      Height:        Intake/Output Summary (Last 24 hours) at 11/17/2021 1037 Last data filed at 12/06/2021 0800 Gross per 24 hour  Intake 4796.89 ml  Output 0 ml  Net 4796.89 ml    PULM  coarse breath sounds on the ventilator CV  RRR VASC  both feet are cyanotic and cool worse on the right than the left.  No distal pulses.  Laboratory CBC    Component Value Date/Time   WBC 36.5 (H) 12/09/2021 0338   HGB 9.2 (L) 11/23/2021 0338   HGB 12.5 12/31/2013 1020   HCT 30.1 (L) 11/23/2021 0338   HCT 38.5 12/31/2013 1020   PLT 253 12/03/2021 0338   PLT 239 12/31/2013 1020    BMET    Component Value Date/Time   NA 132 (L) 11/25/2021 0338   NA 135 (L) 12/31/2013 1020   K 3.7 11/19/2021 0338   K 3.9 12/31/2013 1020   CL 93 (L) 12/02/2021 0338   CL 105 12/31/2013 1020   CO2 18 (L) 12/06/2021 0338   CO2 26 12/31/2013 1020   GLUCOSE 154 (H) 11/28/2021 0338   GLUCOSE 243 (H) 12/31/2013 1020   BUN 55 (H) 12/06/2021 0338   BUN 19 (H) 12/31/2013 1020   CREATININE 10.30 (H) 12/14/2021 0338   CREATININE 2.12 (H) 12/31/2013 1020   CALCIUM 8.1 (L) 11/18/2021 0338   CALCIUM 8.5 12/31/2013 1020   GFRNONAA 5 (L) 12/09/2021 0338   GFRNONAA 31 (L) 12/31/2013 1020   GFRAA 5 (L) 06/18/2020 1217   GFRAA 36 (L) 12/31/2013 1020    Assessment/Planning:   Sepsis with multisystem organ failure and multiple ongoing issues Still requiring pressors Worsening  ischemia of both lower extremities particularly the right foot now developing gangrenous changes In discussions with the critical care service, there is some concern that her ischemic feet are driving much of this inflammatory response.  Her COVID is not felt to be that bad. Given this finding and concern, guillotine amputations are reasonable to remove that as a source and can be done quickly with minimal blood loss. Have discussed with the family and we will plan on performing bilateral guillotine amputations today   [name]  12/03/2021, 10:37 AM

## 2021-12-01 NOTE — Transfer of Care (Signed)
Immediate Anesthesia Transfer of Care Note  Patient: [name]  Procedure(s) Performed: AMPUTATION BELOW KNEE-Guillotine (Bilateral: Ankle)  Patient Location: PACU and ICU  Anesthesia Type:General  Level of Consciousness: Patient remains intubated per anesthesia plan  Airway & Oxygen Therapy: Patient remains intubated per anesthesia plan  Post-op Assessment: Report given to RN  Post vital signs: stable  Last Vitals:  Vitals Value Taken Time  BP    Temp    Pulse    Resp    SpO2      Last Pain:  Vitals:   12/02/2021 0400  TempSrc: Esophageal  PainSc:       Patients Stated Pain Goal: 2 (77/37/36 6815)  Complications: No notable events documented.

## 2021-12-01 NOTE — Progress Notes (Signed)
PD catheter clean and intact.   HD nurse present to remove patient from cycler, cycler off, unable to retreive information. Drain bag full, 3 L of dialysate remain of 18 L placed on the cycler indicating that the cycle was complete. Efforts made to gain information from the machine unsuccessful after speaking with company representative, machine to be exchanged.

## 2021-12-01 NOTE — Progress Notes (Signed)
Family called and asked to come for update to due abnormal CT of head.

## 2021-12-01 NOTE — Progress Notes (Signed)
Left ankle stump with moderate amount of bright red blood.  Negative pressure wound vac turned off.  Donell Beers, NP notified and at bedside. Dr. [name] paged and notified.  Per Dr. [name] remove left wound vac dressing and replace with wet to dry dressing.  Remove Y site from wound vac and restart right ankle stump negative pressure dressing.

## 2021-12-01 NOTE — Progress Notes (Signed)
Went to obtain ABG. This RT was asked to hold off due to family meeting. Asked RN to let me know when they are ready.

## 2021-12-01 NOTE — Plan of Care (Addendum)
Unable to see patient as team preparing to perform procedure. Will try to see later.  Addendum: Repeat NCT head showed new multifocal areas of stroke some of which are large territory.  Discussed with team and family.   Electronically signed by:  Lynnae Sandhoff, MD Page: [phone] 12/12/2021, 9:37 PM

## 2021-12-01 NOTE — Op Note (Signed)
[facility]   OPERATIVE NOTE  DATE: 11/23/2021  PRE-OPERATIVE DIAGNOSIS: Gangrene bilateral feet, sepsis, multisystem organ failure  POST-OPERATIVE DIAGNOSIS: same as above  PROCEDURE: 1.   Bilateral guillotine amputations below the knee  SURGEON: [name]  ASSISTANT(S): Elmore Guise, MD  ANESTHESIA: general  ESTIMATED BLOOD LOSS: 25 cc  FINDING(S): 1.  none   INDICATIONS:   [name] is a 36 y.o. female who presents with gangrenous feet bilaterally with multisystem organ failure and sepsis.  It is unclear if the feet or driving part of the sepsis and the critical care team is asked [place] to consider guillotine amputations to remove a possible source of sepsis.  She is too unstable for definitive amputations and both feet appear nonsalvageable at this point.  Risks and benefits were discussed and informed consent was obtained. An assistant was present during the procedure to help facilitate the exposure and expedite the procedure.   DESCRIPTION: After obtaining full informed written consent, the patient was brought back to the operating room and placed supine upon the operating table.  The patient was prepped and draped in the standard fashion.  The assistant provided retraction and mobilization to help facilitate exposure and expedite the procedure throughout the entire procedure.  This included following suture, using retractors, and optimizing lighting.  A central line was placed and will be dictated separately.  A circular incision was made on the right leg just above the ankle dissected down through the skin, soft tissue, and muscle with combination of scalpel and electrocautery.  The oscillating saw was then used to transect the bone of the tibia as well as the fibula.  The posterior flap was completed sharply.  On the right side, there is essentially no bleeding.  A few small vessels were cauterized and then a VAC sponge was cut to fit the wound with  strips of [place] for an occlusive seal.  We then turned our attention to the left.  A circular incision was made just above the left ankle as well.  Dissecting down through the skin, soft tissue, and muscle with a combination of the scalpel and electrocautery.  The tibia and fibula were then transected with the oscillating saw and the posterior flap was completed sharply.  There was a peroneal artery that was ligated and a few vessels that were cauterized after transection.  A VAC sponge was then cut at the wound and strips bander used and occlusive seal was obtained.  These were connected together and the seal was maintained. At this point, we elected to complete the procedure.  The patient was taken to the recovery room in stable condition.   COMPLICATIONS: None  CONDITION: Stable  [name]  11/27/2021, 12:41 PM    This note was created with Dragon Medical transcription system. Any errors in dictation are purely unintentional.

## 2021-12-01 NOTE — Progress Notes (Signed)
Left ankle wound vac dressing removed.  Wet to dry dressing applied and wrapped with kerlex.  Right ankle stump placed back to negative pressure. Patient did not exhibit signs of pain.  Heart rate did not change with dressing change.

## 2021-12-01 NOTE — Progress Notes (Signed)
Patient returned to ICU by OR staff. Cardiac monitors in place.  Vitals signs stable.

## 2021-12-01 NOTE — Consult Note (Incomplete)
Pharmacy Antibiotic Note  [name] is a 36 y.o. female admitted on 11/28/2021 with left foot wound infection. Patient had outpatient left foot debridement scheduled for today which was canceled due to concern for stroke. Pharmacy has been consulted for vancomycin & cefepime dosing. Patient is ESRD on PD. On 11/27/21 she was administered 2 grams IV vancomycin. Three days later a level was drawn as shown below:  Vancomycin Level: 11/30/21 1339 18 mcg/mL  Plan:  1) continue cefepime 1 gram IV every 24  hours  2) vancomycin is within the therapeutic range to re-dose:  The time between the previous dose and the therapeutic level is 72 hours: this will be the dosing interval Start vancomycin 2000 mg IV every 72 hours may recheck level every 5-7 days   Temp (24hrs), Avg:97.7 F (36.5 C), Min:96.4 F (35.8 C), Max:99.1 F (37.3 C)  Recent Labs  Lab 11/27/21 0528 11/28/21 0602 11/29/21 0342 11/30/21 0314 11/30/21 0430 11/30/21 1339 11/22/2021 0338 11/18/2021 0339 12/07/2021 0601  WBC 14.4* 17.4* 23.3*  --  30.5*  --  36.5*  --   --   CREATININE 12.01* 11.50* 11.44* 10.89*  --   --  10.30*  --   --   LATICACIDVEN  --   --   --   --   --   --   --  2.9* 2.1*  VANCORANDOM  --   --   --   --   --  18  --   --   --      Estimated Creatinine Clearance: 10.5 mL/min (A) (by C-G formula based on SCr of 10.3 mg/dL (H)).    Allergies  Allergen Reactions   Percocet [Oxycodone-Acetaminophen] Itching   Tomato Itching   Gabapentin Other (See Comments)    Extremity tremors    Antimicrobials this admission: 2/10 remdesivir >> 2/14 2/10 cefepime >>  2/11 vancomycin >>  Dose adjustments this admission: N/A; CTM since on ESRD-PD dosing  Microbiology results: 02/12 BCx: NGTD 02/11 BCx: NGTD 02/11 MRSA PCR: negative 02/10 SARS CoV-2: positive 02/10 influenza A/B: negative   Thank you for allowing pharmacy to be a part of this patients care.  Vallery Sa, PharmD, BCPS Clinical  Pharmacist 12/02/2021 2:41 PM

## 2021-12-01 NOTE — Progress Notes (Incomplete)
Date of Admission:  11/29/2021   Total days of antibiotics ***        Day ***        Day ***        Day ***   ID: [name] is a 36 y.o. 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 ([place])   Cerebrovascular 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 (HCC)   Mitral valve disorder    Subjective: ***  Medications:   vitamin C  500 mg Per Tube BID   chlorhexidine gluconate (MEDLINE KIT)  15 mL Mouth Rinse BID   Chlorhexidine Gluconate Cloth  6 each Topical Daily   collagenase   Topical Daily   docusate  100 mg Per Tube BID   feeding supplement (PROSource TF)  45 mL Per Tube TID   feeding supplement (VITAL AF 1.2 CAL)  1,000 mL Per Tube Q24H   free water  30 mL Per Tube Q4H   gentamicin cream  1 application Topical Daily   hydrocortisone sod succinate (SOLU-CORTEF) inj  100 mg Intravenous Q8H   insulin aspart  0-6 Units Subcutaneous Q4H   mouth rinse  15 mL Mouth Rinse 10 times per day   multivitamin  1 tablet Per Tube QHS   pantoprazole (PROTONIX) IV  40 mg Intravenous Q24H   polyethylene glycol  17 g Per Tube Daily   sennosides  5 mL Per Tube BID    Objective: Vital signs in last 24 hours: Temp:  [96.4 F (35.8 C)-98.6 F (37 C)] 97.5 F (36.4 C) (02/15 1500) Pulse Rate:  [83-101] 97 (02/15 1500) Resp:  [7-28] 13 (02/15 1500) SpO2:  [88 %-100 %] 92 % (02/15 1500) Arterial Line BP: (80-144)/(41-75) 110/62 (02/15 1500) FiO2 (%):  [30 %-50 %] 50 %  (02/15 1500) Weight:  [131.7 kg] 131.7 kg (02/14 1809)  PHYSICAL EXAM:  General: Alert, cooperative, no distress, appears stated age.  Head: Normocephalic, without obvious abnormality, atraumatic. Eyes: Conjunctivae clear, anicteric sclerae. Pupils are equal ENT Nares normal. No drainage or sinus tenderness. Lips, mucosa, and tongue normal. No Thrush Neck: Supple, symmetrical, no adenopathy, thyroid: non tender no carotid bruit and no JVD. Back: No CVA tenderness. Lungs: Clear to auscultation bilaterally. No Wheezing or Rhonchi. No rales. Heart: Regular rate and rhythm, no murmur, rub or gallop. Abdomen: Soft, non-tender,not distended. Bowel sounds normal. No masses Extremities: atraumatic, no cyanosis. No edema. No clubbing Skin: No rashes or lesions. Or bruising Lymph: Cervical, supraclavicular normal. Neurologic: Grossly non-focal  Lab Results Recent Labs    11/30/21 0314 11/30/21 0430 12/02/2021 0338  WBC  --  30.5* 36.5*  HGB  --  8.8* 9.2*  HCT  --  28.9* 30.1*  NA 135  --  132*  K 3.6  --  3.7  CL 95*  --  93*  CO2 20*  --  18*  BUN 56*  --  55*  CREATININE 10.89*  --  10.30*  Liver Panel Recent Labs    11/30/21 0314 12/07/2021 0338  PROT 6.8 6.8  ALBUMIN 2.7* 2.5*  AST 10* 20  ALT 5 6  ALKPHOS 129* 135*  BILITOT 0.6 0.9   Sedimentation Rate Recent Labs    11/22/2021 1108  ESRSEDRATE 88*   C-Reactive Protein Recent Labs    11/30/21 0315 12/07/2021 0338  CRP 16.2* 26.6*    Microbiology:  Studies/Results: DG Chest 1 View  Result Date: 12/14/2021 CLINICAL DATA:  Central line placement EXAM: CHEST  1 VIEW COMPARISON:  12/07/2021 at 3:47 a.m. FINDINGS: Endotracheal tube tip is somewhat indistinct but thought to be about 2.4 cm above the carina. Nasogastric tube indistinct but appears to enter the stomach. A left internal jugular central venous catheter is present tip projecting over the SVC. Low lung volumes are present, causing crowding of the pulmonary  vasculature. Stable indistinct airspace opacity at the lung bases, right greater than left. The patient is rotated to the left on today's radiograph, reducing diagnostic sensitivity and specificity. IMPRESSION: 1. Left central line tip projects over the SVC. No visible pneumothorax. 2. Stable bibasilar airspace opacities, right greater than left. Low lung volumes. Electronically Signed   By: Van Clines M.D.   On: 11/18/2021 13:11   DG Chest 1 View  Result Date: 12/13/2021 CLINICAL DATA:  Pneumothorax EXAM: CHEST  1 VIEW COMPARISON:  11/30/2021 FINDINGS: Endotracheal tube and NG tube remain in place unchanged. Cardiomegaly with vascular congestion. Bilateral airspace opacities slightly worsened since prior study, likely worsening edema. Decreasing lung volumes. No effusions. No acute bony abnormality. IMPRESSION: Cardiomegaly with mild vascular congestion and edema/CHF, slightly worsening since prior study. Low lung volumes. Electronically Signed   By: Rolm Baptise M.D.   On: 11/21/2021 03:55   DG Chest Port 1 View  Result Date: 11/30/2021 CLINICAL DATA:  Acute respiratory failure with hypoxia EXAM: PORTABLE CHEST 1 VIEW COMPARISON:  Chest radiograph from one day prior. FINDINGS: Endotracheal tube tip is 2.0 cm above the carina. Enteric tube enters stomach with the tip not seen on this image. Stable cardiomediastinal silhouette with mild to moderate cardiomegaly. No pneumothorax. No pleural effusion. Low lung volumes. Mild pulmonary edema, improved. IMPRESSION: 1. Well-positioned support structures. 2. Mild congestive heart failure, improved. 3. Low lung volumes. Electronically Signed   By: Ilona Sorrel M.D.   On: 11/30/2021 08:08     Assessment/Plan: ***

## 2021-12-02 ENCOUNTER — Encounter: Payer: Self-pay | Admitting: Vascular Surgery

## 2021-12-02 DIAGNOSIS — J9601 Acute respiratory failure with hypoxia: Secondary | ICD-10-CM | POA: Diagnosis not present

## 2021-12-02 DIAGNOSIS — G9341 Metabolic encephalopathy: Secondary | ICD-10-CM | POA: Diagnosis not present

## 2021-12-02 DIAGNOSIS — I10 Essential (primary) hypertension: Secondary | ICD-10-CM

## 2021-12-02 DIAGNOSIS — I6340 Cerebral infarction due to embolism of unspecified cerebral artery: Secondary | ICD-10-CM | POA: Diagnosis not present

## 2021-12-02 DIAGNOSIS — R471 Dysarthria and anarthria: Secondary | ICD-10-CM

## 2021-12-02 DIAGNOSIS — E872 Acidosis, unspecified: Secondary | ICD-10-CM

## 2021-12-02 DIAGNOSIS — I739 Peripheral vascular disease, unspecified: Secondary | ICD-10-CM

## 2021-12-02 DIAGNOSIS — I5032 Chronic diastolic (congestive) heart failure: Secondary | ICD-10-CM

## 2021-12-02 DIAGNOSIS — M869 Osteomyelitis, unspecified: Secondary | ICD-10-CM

## 2021-12-02 DIAGNOSIS — Z992 Dependence on renal dialysis: Secondary | ICD-10-CM

## 2021-12-02 DIAGNOSIS — N186 End stage renal disease: Secondary | ICD-10-CM | POA: Diagnosis not present

## 2021-12-02 DIAGNOSIS — R0902 Hypoxemia: Secondary | ICD-10-CM

## 2021-12-02 DIAGNOSIS — I639 Cerebral infarction, unspecified: Secondary | ICD-10-CM | POA: Diagnosis not present

## 2021-12-02 DIAGNOSIS — D631 Anemia in chronic kidney disease: Secondary | ICD-10-CM

## 2021-12-02 LAB — RENAL FUNCTION PANEL
Albumin: 2.1 g/dL — ABNORMAL LOW (ref 3.5–5.0)
Anion gap: 13 (ref 5–15)
BUN: 37 mg/dL — ABNORMAL HIGH (ref 6–20)
CO2: 24 mmol/L (ref 22–32)
Calcium: 8.7 mg/dL — ABNORMAL LOW (ref 8.9–10.3)
Chloride: 97 mmol/L — ABNORMAL LOW (ref 98–111)
Creatinine, Ser: 5.59 mg/dL — ABNORMAL HIGH (ref 0.44–1.00)
GFR, Estimated: 9 mL/min — ABNORMAL LOW (ref >60)
Glucose, Bld: 178 mg/dL — ABNORMAL HIGH (ref 70–99)
Phosphorus: 5.9 mg/dL — ABNORMAL HIGH (ref 2.5–4.6)
Potassium: 4.2 mmol/L (ref 3.5–5.1)
Sodium: 134 mmol/L — ABNORMAL LOW (ref 135–145)

## 2021-12-02 LAB — CULTURE, BLOOD (ROUTINE X 2)
Culture: NO GROWTH
Culture: NO GROWTH
Special Requests: ADEQUATE
Special Requests: ADEQUATE

## 2021-12-02 LAB — COMPREHENSIVE METABOLIC PANEL
ALT: 8 U/L (ref 0–44)
AST: 36 U/L (ref 15–41)
Albumin: 2.1 g/dL — ABNORMAL LOW (ref 3.5–5.0)
Alkaline Phosphatase: 141 U/L — ABNORMAL HIGH (ref 38–126)
Anion gap: 15 (ref 5–15)
BUN: 45 mg/dL — ABNORMAL HIGH (ref 6–20)
CO2: 23 mmol/L (ref 22–32)
Calcium: 8.5 mg/dL — ABNORMAL LOW (ref 8.9–10.3)
Chloride: 95 mmol/L — ABNORMAL LOW (ref 98–111)
Creatinine, Ser: 7.62 mg/dL — ABNORMAL HIGH (ref 0.44–1.00)
GFR, Estimated: 7 mL/min — ABNORMAL LOW (ref >60)
Glucose, Bld: 183 mg/dL — ABNORMAL HIGH (ref 70–99)
Potassium: 4.2 mmol/L (ref 3.5–5.1)
Sodium: 133 mmol/L — ABNORMAL LOW (ref 135–145)
Total Bilirubin: 0.9 mg/dL (ref 0.3–1.2)
Total Protein: 6.4 g/dL — ABNORMAL LOW (ref 6.5–8.1)

## 2021-12-02 LAB — CBC
HCT: 28.4 % — ABNORMAL LOW (ref 36.0–46.0)
Hemoglobin: 9 g/dL — ABNORMAL LOW (ref 12.0–15.0)
MCH: 31.1 pg (ref 26.0–34.0)
MCHC: 31.7 g/dL (ref 30.0–36.0)
MCV: 98.3 fL (ref 80.0–100.0)
Platelets: 231 10*3/uL (ref 150–400)
RBC: 2.89 MIL/uL — ABNORMAL LOW (ref 3.87–5.11)
RDW: 17.9 % — ABNORMAL HIGH (ref 11.5–15.5)
WBC: 39.7 10*3/uL — ABNORMAL HIGH (ref 4.0–10.5)
nRBC: 1.8 % — ABNORMAL HIGH (ref 0.0–0.2)

## 2021-12-02 LAB — MAGNESIUM: Magnesium: 2.2 mg/dL (ref 1.7–2.4)

## 2021-12-02 LAB — GLUCOSE, CAPILLARY
Glucose-Capillary: 153 mg/dL — ABNORMAL HIGH (ref 70–99)
Glucose-Capillary: 143 mg/dL — ABNORMAL HIGH (ref 70–99)
Glucose-Capillary: 156 mg/dL — ABNORMAL HIGH (ref 70–99)
Glucose-Capillary: 181 mg/dL — ABNORMAL HIGH (ref 70–99)
Glucose-Capillary: 171 mg/dL — ABNORMAL HIGH (ref 70–99)
Glucose-Capillary: 160 mg/dL — ABNORMAL HIGH (ref 70–99)

## 2021-12-02 LAB — C-REACTIVE PROTEIN: CRP: 35.2 mg/dL — ABNORMAL HIGH (ref <1.0)

## 2021-12-02 LAB — HEPARIN LEVEL (UNFRACTIONATED): Heparin Unfractionated: 0.42 IU/mL (ref 0.30–0.70)

## 2021-12-02 LAB — FERRITIN: Ferritin: 750 ng/mL — ABNORMAL HIGH (ref 11–307)

## 2021-12-02 LAB — D-DIMER, QUANTITATIVE: D-Dimer, Quant: 3.02 ug/mL-FEU — ABNORMAL HIGH (ref 0.00–0.50)

## 2021-12-02 LAB — FACTOR 5 LEIDEN

## 2021-12-02 MED ORDER — IPRATROPIUM-ALBUTEROL 0.5-2.5 (3) MG/3ML IN SOLN
3.0000 mL | Freq: Four times a day (QID) | RESPIRATORY_TRACT | Status: DC
  Administered 2021-12-02 – 2021-12-04 (×9): 3 mL via RESPIRATORY_TRACT
  Filled 2021-12-02 (×9): qty 3

## 2021-12-02 MED ORDER — LACTULOSE 10 GM/15ML PO SOLN
30.0000 g | Freq: Three times a day (TID) | ORAL | Status: DC
  Administered 2021-12-02 – 2021-12-03 (×4): 30 g
  Filled 2021-12-02 (×4): qty 60

## 2021-12-02 MED ORDER — ALBUTEROL SULFATE (2.5 MG/3ML) 0.083% IN NEBU: 2.5000 mg | INHALATION_SOLUTION | RESPIRATORY_TRACT | Status: DC | PRN

## 2021-12-02 NOTE — Consult Note (Signed)
[place] for heparin Indication:  Thrombus  Allergies  Allergen Reactions   Percocet [Oxycodone-Acetaminophen] Itching   Tomato Itching   Gabapentin Other (See Comments)    Extremity tremors    Patient Measurements: Height: 5\' 6"  (167.6 cm) Weight: (!) 138 kg (304 lb 3.8 oz) IBW/kg (Calculated) : 59.3 Heparin Dosing Weight: 92.3 Kg  Vital Signs: Temp: 98.1 F (36.7 C) (02/16 1100) Temp Source: Esophageal (02/16 1100) Pulse Rate: 81 (02/16 1100)  Labs: Recent Labs    11/30/21 0430 11/30/21 1339 11/30/21 2226 12/06/2021 0338 11/30/2021 0532 11/23/2021 0601 12/05/2021 1810 12/12/2021 2135 12/02/21 0407  HGB 8.8*  --   --  9.2*  --   --   --  9.1* 9.0*  HCT 28.9*  --   --  30.1*  --   --   --  29.6* 28.4*  PLT 267  --   --  253  --   --   --   --  231  HEPARINUNFRC  --  0.32 0.26*  --   --  0.24*  --   --   --   CREATININE  --   --   --  10.30*  --   --  10.56*  --  7.62*  CKTOTAL  --   --   --   --   --  913*  --   --   --   TROPONINIHS  --   --   --  269* 269*  --   --   --   --      Estimated Creatinine Clearance: 14.6 mL/min (A) (by C-G formula based on SCr of 7.62 mg/dL (H)).   Medical History: Past Medical History:  Diagnosis Date   Anemia of chronic disease    Asthma    well controlled-no inhaler   Calciphylaxis 2020   CHF (congestive heart failure) ([place])    CKD (chronic kidney disease), stage III ([place])    Diabetes ([place])    type 2-diet controlled   Dyspnea    Hypertension    Morbid obesity ([place])    Nephrotic syndrome    PAD (peripheral artery disease) ([place])    Sepsis ([place]) 06/2021    Medications:  No chronic anticoagulation PTA Heparin Dosing Weight: 92.3 Kg  Assessment: Pharmacy has been consulted to initiate and monitor heparin in 35yo patient admitted with stroke-like symptoms. Her symptoms began to resolve, but neurologist noted concern for lower extremity ischemia, likely embolic. H&H, platelets have been  stable. 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  Goal of Therapy:  Heparin level 0.3-0.5 units/ml (Per Stroke Protocol) Monitor platelets by anticoagulation protocol: Yes  Date Time HL  Comment/rate 2/10 2222 HL < 0.10, subtherapeutic 2/11 0906 HL 0.12, subtherapeutic 2/11 2048 HL 0.22, subtherapeutic  2/12 0602 HL 0.44, therapeutic x 1 2/12 1500 HL 0.29, subtherapeutic; 2100>2200 un/hr 2/12 2356 HL 0.36, therapeutic x 1; 2200 un/hr 2/13 0916 HL 0.40, therapeutic x2; 2200 un/hr 2/14 0314 HL 0.51: 2200 >> 2100 units/hr 2/14 1339 HL 0.32: 2100 units/hr 2/14 2226 HL 0.26: 2100 >>2200 units/hr 2/15 0601 HL 0.24  2200 >>2300 units/hr   Plan:  Will restart heparin infusion at 2300 units/hr recheck level 8 hrs after restarting  Daily CBC while on IV heparin  Dallie Piles Clinical Pharmacist 12/02/2021 11:25 AM

## 2021-12-02 NOTE — Progress Notes (Signed)
Order for Duoneb every 6 hr per protocol assessment score of 13 and very decreased breath sounds

## 2021-12-02 NOTE — Progress Notes (Signed)
Progress Note  Patient Name: [name] Date of Encounter: 12/02/2021  [facility] Cardiologist: [facility]  Subjective   Intubated, no family at the bedside Underwent amputation lower extremity yesterday On pressors, sedation infusion On CRRT  MRI shows multiple chronic vessel small ischemic changes and lacunar infarcts concerning for embolic disease.   COVID-positive, has completed remdesivir  Inpatient Medications    Scheduled Meds:  vitamin C  500 mg Per Tube BID   chlorhexidine gluconate (MEDLINE KIT)  15 mL Mouth Rinse BID   Chlorhexidine Gluconate Cloth  6 each Topical Daily   collagenase   Topical Daily   docusate  100 mg Per Tube BID   feeding supplement (PROSource TF)  45 mL Per Tube TID   feeding supplement (VITAL AF 1.2 CAL)  1,000 mL Per Tube Q24H   free water  30 mL Per Tube Q4H   gentamicin cream  1 application Topical Daily   hydrocortisone sod succinate (SOLU-CORTEF) inj  100 mg Intravenous Q8H   insulin aspart  0-6 Units Subcutaneous Q4H   ipratropium-albuterol  3 mL Nebulization Q6H   lactulose  30 g Per Tube TID   mouth rinse  15 mL Mouth Rinse 10 times per day   multivitamin  1 tablet Per Tube QHS   pantoprazole (PROTONIX) IV  40 mg Intravenous Q24H   polyethylene glycol  17 g Per Tube Daily   sennosides  5 mL Per Tube BID   Continuous Infusions:   prismasol BGK 4/2.5 400 mL/hr at 12/02/21 0747    prismasol BGK 4/2.5 400 mL/hr at 12/02/21 0747   sodium chloride Stopped (12/02/21 1238)   ceFEPime (MAXIPIME) IV Stopped (12/02/21 1034)   fentaNYL infusion INTRAVENOUS 50 mcg/hr (12/02/21 1300)   heparin 2,300 Units/hr (12/02/21 1300)   metronidazole 100 mL/hr at 12/02/21 1300   midazolam Stopped (11/30/2021 1800)   norepinephrine (LEVOPHED) Adult infusion 14 mcg/min (12/02/21 1300)   prismasol BGK 4/2.5 1,500 mL/hr at 12/02/21 1209   vancomycin     vasopressin 0.03 Units/min (12/02/21 1300)   PRN Meds: sodium chloride, acetaminophen **OR**  acetaminophen (TYLENOL) oral liquid 160 mg/5 mL **OR** acetaminophen, albuterol, fentaNYL, fentaNYL (SUBLIMAZE) injection, heparin sodium (porcine), sodium chloride   Vital Signs    Vitals:   12/02/21 1100 12/02/21 1145 12/02/21 1200 12/02/21 1300  BP:      Pulse: 81  81 80  Resp: (!) 27  (!) 27 (!) 27  Temp: 98.1 F (36.7 C)  97.9 F (36.6 C) 98.4 F (36.9 C)  TempSrc: Esophageal  Esophageal Esophageal  SpO2: 98% 97% 97% 97%  Weight:      Height:        Intake/Output Summary (Last 24 hours) at 12/02/2021 1317 Last data filed at 12/02/2021 1300 Gross per 24 hour  Intake 5165.66 ml  Output 2962 ml  Net 2203.66 ml   Last 3 Weights 12/02/2021 11/30/2021 11/30/2021  Weight (lbs) 304 lb 3.8 oz 290 lb 5.5 oz 290 lb 5.5 oz  Weight (kg) 138 kg 131.7 kg 131.7 kg      Telemetry    Normal sinus rhythm- Personally Reviewed  ECG     - Personally Reviewed  Physical Exam   GEN: No acute distress.   Neck: No JVD Cardiac: RRR, no murmurs, rubs, or gallops.  Respiratory: Clear to auscultation bilaterally. GI: Soft, nontender, non-distended  MS: No edema; No deformity. Neuro:  Nonfocal  Psych: Normal affect   Labs    High Sensitivity Troponin:  Recent Labs  Lab 11/29/2021 0338 12/13/2021 0532  TROPONINIHS 269* 269*     Chemistry Recent Labs  Lab 11/30/21 0314 11/18/2021 0338 11/21/2021 0601 11/21/2021 1810 12/02/21 0407  NA 135 132*  --  133* 133*  K 3.6 3.7  --  4.4 4.2  CL 95* 93*  --  91* 95*  CO2 20* 18*  --  20* 23  GLUCOSE 128* 154*  --  148* 183*  BUN 56* 55*  --  53* 45*  CREATININE 10.89* 10.30*  --  10.56* 7.62*  CALCIUM 8.1* 8.1*  --  8.3* 8.5*  MG 2.0  --  1.8  --  2.2  PROT 6.8 6.8  --   --  6.4*  ALBUMIN 2.7* 2.5*  --  2.1* 2.1*  AST 10* 20  --   --  36  ALT 5 6  --   --  8  ALKPHOS 129* 135*  --   --  141*  BILITOT 0.6 0.9  --   --  0.9  GFRNONAA 4* 5*  --  4* 7*  ANIONGAP 20* 21*  --  22* 15    Lipids  Recent Labs  Lab 12/09/2021 1830  11/30/21 0315  CHOL 125  --   TRIG 73 125  HDL 34*  --   LDLCALC 76  --   CHOLHDL 3.7  --     Hematology Recent Labs  Lab 11/30/21 0430 11/29/2021 0338 12/11/2021 2135 12/02/21 0407  WBC 30.5* 36.5*  --  39.7*  RBC 2.84* 2.94*  --  2.89*  HGB 8.8* 9.2* 9.1* 9.0*  HCT 28.9* 30.1* 29.6* 28.4*  MCV 101.8* 102.4*  --  98.3  MCH 31.0 31.3  --  31.1  MCHC 30.4 30.6  --  31.7  RDW 18.2* 18.2*  --  17.9*  PLT 267 253  --  231   Thyroid No results for input(s): TSH, FREET4 in the last 168 hours.  BNP Recent Labs  Lab 12/11/2021 0338  BNP 1,566.6*    DDimer  Recent Labs  Lab 11/30/21 0314 11/30/2021 0338 12/02/21 0407  DDIMER 2.94* 2.63* 3.02*     Radiology    DG Chest 1 View  Result Date: 12/08/2021 CLINICAL DATA:  Central line placement EXAM: CHEST  1 VIEW COMPARISON:  11/23/2021 at 3:47 a.m. FINDINGS: Endotracheal tube tip is somewhat indistinct but thought to be about 2.4 cm above the carina. Nasogastric tube indistinct but appears to enter the stomach. A left internal jugular central venous catheter is present tip projecting over the SVC. Low lung volumes are present, causing crowding of the pulmonary vasculature. Stable indistinct airspace opacity at the lung bases, right greater than left. The patient is rotated to the left on today's radiograph, reducing diagnostic sensitivity and specificity. IMPRESSION: 1. Left central line tip projects over the SVC. No visible pneumothorax. 2. Stable bibasilar airspace opacities, right greater than left. Low lung volumes. Electronically Signed   By: Van Clines M.D.   On: 11/20/2021 13:11   DG Chest 1 View  Result Date: 11/17/2021 CLINICAL DATA:  Pneumothorax EXAM: CHEST  1 VIEW COMPARISON:  11/30/2021 FINDINGS: Endotracheal tube and NG tube remain in place unchanged. Cardiomegaly with vascular congestion. Bilateral airspace opacities slightly worsened since prior study, likely worsening edema. Decreasing lung volumes. No effusions.  No acute bony abnormality. IMPRESSION: Cardiomegaly with mild vascular congestion and edema/CHF, slightly worsening since prior study. Low lung volumes. Electronically Signed   By: Rolm Baptise M.D.  On: 11/19/2021 03:55   CT HEAD WO CONTRAST (5MM)  Result Date: 11/19/2021 CLINICAL DATA:  Follow up stroke EXAM: CT HEAD WITHOUT CONTRAST TECHNIQUE: Contiguous axial images were obtained from the base of the skull through the vertex without intravenous contrast. RADIATION DOSE REDUCTION: This exam was performed according to the departmental dose-optimization program which includes automated exposure control, adjustment of the mA and/or kV according to patient size and/or use of iterative reconstruction technique. COMPARISON:  Multiple exams, including 11/27/2021 and MRI brain from 11/27/2021 FINDINGS: Brain: Abnormal hypodensity and loss of gray-white differentiation throughout the right occipital lobe, a substantial portion of the right temporal lobe, and much of the right parietal lobe compatible with a large distribution infarct involving PCA and posterior MCA distribution on the right, substantially larger than the small perfusion defects seen on the right side on the MRI from 12/03/2021. There is also substantial vasogenic edema potentially some cytotoxic edema posteriorly in the left frontal lobe, substantially disproportionate to the small infarcts shown on the 11/19/2021 exam likewise suspicious for progressive infarcts. No midline shift. New patchy white matter hypodensities in both frontal lobes have substantially increased from 11/27/2021 and are concerning for potential white matter infarcts or associated vasogenic edema. This includes hypodensities along the internal capsules and periventricular white matter. Strictly speaking in the context of the patient's lower extremity ischemia, infection is not completely excluded although ischemia seems more likely given the other findings intracranially. No  large amount of intracranial hemorrhage. No hydrocephalus or effacement of the basilar cisterns. Vascular: No hyperdense MCA. Widespread age advanced atherosclerotic vascular calcifications as can be encountered in the setting of diabetes. Skull: Right frontal osteoma noted. Patchy demineralization in the calvarium is unchanged. Sinuses/Orbits: Unremarkable Other: No supplemental non-categorized findings. IMPRESSION: 1. Large subacute infarct involving the right temporal, parietal, and occipital lobes. Indistinct subacute infarct in the left central MCA distribution, posteriorly in the left frontal lobe. Patchy new white matter hypodensities along the internal capsules and periventricular white matter concerning for additional ischemic lesions in this clinical context. Overall this represents a marked worsening/progression compared to prior images of 11/17/2021 and 11/27/2021. No current overt intracranial hematoma identified. No current hydrocephalus or effacement of the basilar cisterns. These results were called by telephone at the time of interpretation on 11/24/2021 at 4:38 pm to provider DANA NELSON , who verbally acknowledged these results. Electronically Signed   By: Van Clines M.D.   On: 12/08/2021 16:45    Cardiac Studies   Echo  1. Left ventricular ejection fraction, by estimation, is 55 to 60%. The  left ventricle has normal function. The left ventricle has no regional  wall motion abnormalities. There is mild concentric left ventricular  hypertrophy. Left ventricular diastolic  parameters are indeterminate. Elevated left atrial pressure.   2. Right ventricular systolic function is normal. The right ventricular  size is normal.   3. Left atrial size was severely dilated.   4. Restricted anterior and posterior mitral valve leaflet motion.  Compared with the echo in 2017, the mitral valve is now heavily thickened  and clacified. These findings are all new. Could represent  endocarditis.  Recommend TEE to better evaluate. The  mitral valve is degenerative. Moderate mitral valve regurgitation. Severe  mitral stenosis. Severe mitral annular calcification.   5. Tricuspid valve regurgitation is moderate to severe.   6. The aortic valve is tricuspid. Aortic valve regurgitation is not  visualized. No aortic stenosis is present.   7. The inferior vena cava is normal  in size with greater than 50%  respiratory variability, suggesting right atrial pressure of 3 mmHg.   Patient Profile     36 y.o. female with a history of end-stage renal disease on PD, lower extremity gangrene, obesity, hypertension, HFpEF presenting for peripheral vascular procedure, became altered, and left-sided weakness, diagnosed with acute stroke.  Echocardiogram showed mitral valve abnormality/calcification being seen for possible endocarditis.  Assessment & Plan    Mitral valve abnormality Appears calcified with gradient Unable to definitively exclude valve infection Not a candidate for TEE or surgical intervention on a valve given intubated, hemodynamically unstable, COVID, recent amputation, respiratory distress, strokes  Strokes New multifocal areas, large territory Followed by neurology Concern for severe brain damage that is irreversible per ICU team  End-stage renal disease on dialysis Management per nephrology On CRRT  COVID 19, acute respiratory failure Intubated, sedated on antibiotics  PAD, foot gangrene Amputation yesterday  Anemia of chronic kidney disease  Diabetes type 2 with chronic kidney disease   Critically ill, multiorgan system failure  Total encounter time more than 50 minutes  Greater than 50% was spent in counseling and coordination of care with the patient  [facility] will sign off.   Medication Recommendations: No further changes Other recommendations (labs, testing, etc): No further testing Follow up as an outpatient: If meaningful recovery  obtained  For questions or updates, please contact [place] Please consult [email or web address] for contact info under        Signed, Ida Rogue, MD  12/02/2021, 1:17 PM

## 2021-12-02 NOTE — Anesthesia Postprocedure Evaluation (Signed)
Anesthesia Post Note  Patient: [name]  Procedure(s) Performed: AMPUTATION BELOW KNEE-Guillotine (Bilateral: Ankle)  Patient location during evaluation: SICU Anesthesia Type: General Level of consciousness: sedated Pain management: pain level controlled Vital Signs Assessment: post-procedure vital signs reviewed and stable Respiratory status: patient on ventilator - see flowsheet for VS Cardiovascular status: stable Postop Assessment: no apparent nausea or vomiting Anesthetic complications: no   No notable events documented.   Last Vitals:  Vitals:   12/02/21 0600 12/02/21 0700  BP:    Pulse: 86 86  Resp: (!) 22 (!) 25  Temp: 36.9 C 37 C  SpO2: 98% 97%    Last Pain:  Vitals:   12/02/21 0700  TempSrc: Esophageal  PainSc:                  Alison Stalling

## 2021-12-02 NOTE — Progress Notes (Signed)
NAME:  [name], MRN:  [id number], DOB:  1986/09/30, LOS: 6 ADMISSION DATE:  11/28/2021, CONSULTATION DATE:  11/27/21 REFERRING MD:  Dr. Loleta Books, CHIEF COMPLAINT:  CODE STROKE   History of Present Illness:  36 yo F presenting to [facility] ED with complaints of difficulty speaking & left sided hemiparesis around 11:30 AM on 11/25/2021.  (All information gathered from chart review and attending/EDP documentation as patient is unable to participate in interview and there is no family at bedside.) Patient was originally scheduled for a vascular procedure on 11/25/2021 and prior to arriving she had taken antihypertensives at which point her mother states the patient became confused and had difficulty speaking as well as difficulty moving her left side.   ED course: CODE STROKE initiated patient evaluated by neurology.  Patient speech had improved as well as left-sided weakness, however she remains encephalopathic. MRI showed acute and subacute CVA in multiple vascular territories and the patient was admitted by [facility] for further stroke work-up.  It was also noted on admission the patient was pulseless in the right foot for which vascular surgery was consulted. medications given: Patient was started on heparin drip per vascular surgery recommendation of which neurology is aware. Initial Vitals: Afebrile at 98.8, mildly tachypneic at 21, NSR at 88, BP soft 85/74 > 95/74 with SPO2 88% on 2 L nasal cannula. Significant labs: (Labs/ Imaging personally reviewed) I, Domingo Pulse [place], AGACNP-BC, personally viewed and interpreted this ECG. EKG Interpretation: Date: 11/25/2021, EKG Time: 14:37, Rate: 94, Rhythm: NSR, QRS Axis: Normal, Intervals: Prolonged QTc, ST/T Wave abnormalities: None, Narrative Interpretation: NSR with nonspecific intraventricular block and baseline wander Chemistry: Cl: 94, BUN/Cr.:  65/11.34, Serum CO2/ AG: 20/26, alk phos: 131, albumin: 2.8, CRP: 5.9 Hematology: WBC: 16.6, Hgb: 9.4,   COVID-19 & Influenza A/B: COVID-positive CXR 11/29/2021: Cardiomegaly with mild pulmonary congestion X-ray left foot 2 views 11/23/2021: postsurgical changes of transmetatarsal amputation of the first through fifth rays. new bony irregularity along the resection margins of the third, fourth and fifth metatarsals concerning for osteomyelitis MRI brain without contrast 11/27/2021: Subcentimeter acute cortically based infarcts with the lateral right frontal lobe and posterior left frontal lobe. patchy small acute infarcts within the callosal splenium on the right additional small recent infarcts (measuring up to 12 mm) are questioned within the left frontoparietal white matter given the small infarcts involving multiple vascular territories there is a suspicion for embolic process. small chronic cortical and subcortical infarct within the right parietoccipital lobes.  Chronic hemorrhagic lacunar infarct within the left caudate nucleus.  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  Mild mucosal thickening within the bilateral ethmoid sinuses.  Trapped fluid within the left petrous apex.  Hospital course: [facility] consulted for admission and neurology consulted for acute /subacute multiple infarcts.  Nephrology consulted due to peritoneal dialysis requirements.  Vascular surgery consulted for assistance with right lower extremity ischemia.  Patient was started on heparin drip with additional imaging and work-up for acute and subacute CVA.  Antibiotics started for concerns of osteomyelitis in the foot.  Echocardiogram 11/27/2021: LVEF 55 to 60%, no regional wall motion abnormalities, mild concentric LVH, elevated LA pressure, RV systolic function is normal, LA size severely dilated. Restricted anterior and posterior mitral valve leaflet motion compared with the echo in 2017 with concern for endocarditis. Moderate MR, Severe MS, moderate-severe TR, no AS.   11/27/21: Early in the day neurology  documented the patient has aphasic but inact in terms of following  commands, with improved left sided hemiparesis and new right arm drift. Per TRH documentation, patient received Dilaudid for severe pain in ischemic LE, then waking up from a nap more confused with more Right sided hemiparesis. STAT CTH ordered. While in CT patient became acutely hypoxic when laid flat, desaturations in the 70's. Rapid response called and patient transferred to SDU for monitoring. CT head wo contrast 11/27/21: No new intracranial abnormalities  PCCM consulted for additional management and monitoring while in SDU.  Pertinent  Medical History  PAD Anemia of chronic disease T2DM Asthma ESRD on PD HFpEF HTN Obesity calciphylaxis  Significant Hospital Events: Including procedures, antibiotic start and stop dates in addition to other pertinent events   11/19/2021: Admit to inpatient as CODE STROKE with multiple  acute/subacute infarcts on MRI, ESRD on PD, acute RLE ischemia & suspected osteomyelitis on L foot with Neurology, Nephrology & Vascular surgery following 11/27/21: Echo concerning for endocarditis, new confusion/right sided hemiparesis-STAT CT ordered, but when patient laid flat became hypoxic- rapid response to SDU with PCCM consult. 11/28/21: US Venous Bilateral revealed No evidence of deep venous thrombosis in either lower extremity to the level of the knees. Calf veins are not well seen due to patient body habitus and subcutaneous edema. 11/28/21: possible endocarditis.  She also has severe comorbid conditions that require multiple specialists involved. Together the entire medical team reviewed plan and agreement to transfer to [facility] for additional evaluation and management. reviewed medical plan with family including mother Lakota Schweppe.  11/29/21: remains encephalopathic, increased WOB, emergently intubated, family updated 2/14 remains on vent, pressors, per Nephrology PD overnight 12/14/2021: Pt  with worsening hypotension overnight requiring levophed/neo-synephrine/vasopressin gtts.  Also, vent dyssynchrony overnight no amenable to vent changes or increased sedation requiring vecuronium x1 dose. CRRT started.  pupils were unreactive and CT head showed new multifocal areas of stroke some of which are large territory. 2/16:  Continues on CRRT.  Continues to require Levophed and Vasopressin.  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 Subacute infarct in the left central MCA distribution, posteriorly in left frontal lobe. Patchy new white matter hypodensities along internal capsules and periventricular white matter concerning for additional ischemic strokes. No overt intracranial hematoma and no current hydrocephalus or effacement of the basilar cisterns. -Neurology is following -Discussed with  Vascular Surgery, plan to restart Heparin gtt -Discussed with Dr. Merrilee Jansky, will obtain ANA w/ reflex panel and Limited Echo with bubble study  Objective   Blood pressure (!) 69/39, pulse 80, temperature 98.6 F (37 C), temperature source Esophageal, resp. rate (!) 26, height 5' 6"  (1.676 m), weight (!) 138 kg, SpO2 98 %.    Vent Mode: PRVC FiO2 (%):  [50 %] 50 % Set Rate:  [15 bmp-20 bmp] 20 bmp Vt Set:  [400 mL] 400 mL PEEP:  [5 cmH20] 5 cmH20 Plateau Pressure:  [20 cmH20] 20 cmH20   Intake/Output Summary (Last 24 hours) at 12/02/2021 1440 Last data filed at 12/02/2021 1400 Gross per 24 hour  Intake 5300.66 ml  Output 3042  ml  Net 2258.66 ml    [employer]  11/30/21 0500 11/30/21 1809 12/02/21 0317  Weight: 131.7 kg 131.7 kg (!) 138 kg    Examination: General: Critically ill appearing female, sedated/mechanically intubated NAD  HEENT: Supple, no JVD, moist mucous membranes  Neuro: sedated (RASS -4), not following commands, right pupil 4 mm/left pupil 3 mm reactive/sluggish  CV: S1S2, rrr, no M/R/G, trace bilateral lower extremity edema, 1+ via doppler right radial/bilateral popliteal pulses, 1+ palpable left radial pulse Pulm: Faint crackles throughout, synchronous with the vent, even, non labored  GI: soft, rounded, non tender, non distended, bs x 4 Skin: Bilateral BKA Extremities: Left TMA  Resolved Hospital Problem list     Assessment & Plan:   Acute Metabolic Encephalopathy Multiple Acute/subacute infarcts most likely embolic  Sedation needs in setting of mechanical ventilation PMHx: HLD, HTN -Maintain a RASS goal of -3 to -4 -Fentanyl gtt as needed to maintain RASS goal -Avoid sedating medications as able -Daily wake up assessment -Neurology following, appreciate input ~ will follow recommendations -TEE once stable -SBP goal 100-140 -continue heparin stroke protocol -consider CTA Head and Neck once stable  Septic Shock ? Mitral Valve Endocarditis Mitral Stenosis -Continuous cardiac monitoring -Maintain MAP >65 -Vasopressors as needed to maintain MAP goal -Stress dose steroids -Trend lactic acid until normalized -Trend HS Troponin until peaked -Echocardiogram pending - Cardiology consulted appreciate input~TEE once stabilized   Severe Sepsis due to possible osteomyelitis of the Left TMA, possible mitral valve endocarditis, and ischemic right foot  Mitral stenosis  -Monitor fever curve -Trend WBC's & Procalcitonin -Follow cultures as above -Continue empiric Cefepime, Flagyl, & Vancomycin pending cultures & sensitivities -Vascular Surgery following, appreciate input ~ s/p  bilateral guillotine amputation on 11/19/2021 -Restart Heparin gtt -Cardiology following, appreciate input ~ TEE once stabilized  Type 2 Diabetes Mellitus Controlled, HgbA1c: 5.1  - Q 4 CBG monitoring - SSI  - Target CBG range 140-180 - Follow ICU hypo/hyper-glycemia protocol  Acute Hypoxic Respiratory Failure in the setting of COVID-19 infection & multiple metabolic derangements, and metabolic encephalopathy - Mechanical ventilation via ARDS protocol, target PRVC 6 cc/kg - Wean PEEP and FiO2 as able to maintain O2 sats >88% - Goal plateau pressure less than 30, driving pressure less than 15 - Paralytics if necessary for vent synchrony, gas exchange - Cycle prone positioning if necessary for oxygenation - Deep sedation per PAD protocol, goal RASS -3 to -4 for now, currently fentanyl, midazolam - Diuresis as blood pressure and renal function can tolerate, goal CVP 5-8.   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  Prognosis is extremely guarded, and long term prognosis is poor.  Pt is DNR/DNI.  Best Practice (right click and "Reselect all SmartList Selections" daily)  Diet/type: NPO; trickle feeds  DVT prophylaxis: systemic heparin GI prophylaxis: PPI Lines: Right femoral trialysis catheter; peritoneal dialysis catheter  Foley:  N/A Code Status:   DNR Last date of multidisciplinary goals of care discussion [12/02/2021]  Labs   CBC: Recent Labs  Lab 11/18/2021 1320 11/24/2021 1412 11/27/21 0528 11/28/21 0602 11/29/21 0342 11/30/21 0430 11/30/2021 0338 12/02/2021 2135 12/02/21 0407  WBC 17.2* 16.6* 14.4* 17.4* 23.3* 30.5* 36.5*  --  39.7*  NEUTROABS 14.0* 14.0* 11.8*  --   --   --   --   --   --   HGB 9.2* 9.4* 8.6* 8.6* 8.5* 8.8* 9.2* 9.1* 9.0*  HCT 28.4* 29.6* 27.4* 27.4* 27.5* 28.9* 30.1* 29.6* 28.4*  MCV 99.6 98.7 100.4* 100.4* 100.0 101.8* 102.4*  --  98.3  PLT 280 247 242 253 274 267 253  --  231     Basic Metabolic Panel: Recent Labs  Lab 11/28/21 0602 11/29/21 0342 11/30/21 0314 11/18/2021 0338 11/20/2021 0601 12/06/2021 1810 12/02/21 0407  NA 136 134* 135 132*  --  133* 133*  K 4.0 3.5 3.6 3.7  --  4.4 4.2  CL 93* 93* 95* 93*  --  91* 95*  CO2 18* 15* 20* 18*  --  20* 23  GLUCOSE 108* 152* 128* 154*  --  148* 183*  BUN 67* 64* 56* 55*  --  53* 45*  CREATININE 11.50* 11.44* 10.89* 10.30*  --  10.56* 7.62*  CALCIUM 7.9* 7.8* 8.1* 8.1*  --  8.3* 8.5*  MG 1.5* 1.6* 2.0  --  1.8  --  2.2  PHOS 10.0* 9.3* 10.8*  --   --  5.8*  --     GFR: Estimated Creatinine Clearance: 14.6 mL/min (A) (by C-G formula based on SCr of 7.62 mg/dL (H)). Recent Labs  Lab 11/29/21 0342 11/30/21 0430 12/14/2021 0338 12/09/2021 0339 11/30/2021 0601 12/02/21 0407  WBC 23.3* 30.5* 36.5*  --   --  39.7*  LATICACIDVEN  --   --   --  2.9* 2.1*  --      Liver Function Tests: Recent Labs  Lab 11/28/21 0602 11/29/21 0342 11/30/21 0314 12/10/2021 0338 11/21/2021 1810 12/02/21 0407  AST 11* 12* 10* 20  --  36  ALT 6 <5 5 6   --  8  ALKPHOS 120 132* 129* 135*  --  141*  BILITOT 1.0 1.0 0.6 0.9  --  0.9  PROT 6.5 6.6 6.8 6.8  --  6.4*  ALBUMIN 2.8* 2.7* 2.7* 2.5* 2.1* 2.1*    No results for input(s): LIPASE, AMYLASE in the last 168 hours. No results for input(s): AMMONIA in the last 168 hours.  ABG    Component Value Date/Time    PHART 7.22 (L) 12/03/2021 0406   PCO2ART 49 (H) 11/22/2021 0406   PO2ART 113 (H) 11/30/2021 0406   HCO3 21.4 11/30/2021 1932   TCO2 26 09/08/2021 1418   ACIDBASEDEF 8.5 (H) 11/22/2021 1932   O2SAT 73.1 12/07/2021 1932      Coagulation Profile: Recent Labs  Lab 11/30/2021 2222  INR 1.3*     Cardiac Enzymes: Recent Labs  Lab 12/09/2021 0601  CKTOTAL 913*    HbA1C: Hgb A1c MFr Bld  Date/Time Value Ref Range Status  12/14/2021 06:30 PM 5.1 4.8 - 5.6 % Final    Comment:    (NOTE) Pre diabetes:          5.7%-6.4%  Diabetes:              >6.4%  Glycemic control for   <7.0% adults with diabetes   11/24/2021 10:05 AM 5.0 4.8 - 5.6 % Final    Comment:    (NOTE) Pre diabetes:  5.7%-6.4%  Diabetes:              >6.4%  Glycemic control for   <7.0% adults with diabetes     CBG: Recent Labs  Lab 12/09/2021 1917 11/25/2021 2303 12/02/21 0307 12/02/21 0745 12/02/21 1201  GLUCAP 114* 133* 153* 143* 156*     Review of Systems:   Unable to assess pt mechanically intubated and sedated  Past Medical History:  She,  has a past medical history of Anemia of chronic disease, Asthma, Calciphylaxis (2020), CHF (congestive heart failure) ([place]), CKD (chronic kidney disease), stage III ([place]), Diabetes ([place]), Dyspnea, Hypertension, Morbid obesity ([place]), Nephrotic syndrome, PAD (peripheral artery disease) ([place]), and Sepsis ([place]) (06/2021).   Surgical History:   Past Surgical History:  Procedure Laterality Date   A/V FISTULAGRAM Left 10/29/2020   Procedure: A/V FISTULAGRAM;  Surgeon: Algernon Huxley, MD;  Location: [place] [facility];  Service: Cardiovascular;  Laterality: Left;   AMPUTATION Bilateral 11/27/2021   Procedure: AMPUTATION BELOW KNEE-Guillotine;  Surgeon: Algernon Huxley, MD;  Location: [facility];  Service: Vascular;  Laterality: Bilateral;   APPLICATION OF WOUND VAC Bilateral 09/15/2019   Procedure: APPLICATION OF WOUND VAC;  Surgeon: Benjamine Sprague, DO;  Location:  [facility];  Service: General;  Laterality: Bilateral;   AV FISTULA PLACEMENT Left 07/09/2020   Procedure: ARTERIOVENOUS (AV) FISTULA CREATION ( BRACHIAL CEPHALIC);  Surgeon: Algernon Huxley, MD;  Location: [facility];  Service: Vascular;  Laterality: Left;   DIALYSIS/PERMA CATHETER INSERTION N/A 02/28/2019   Procedure: DIALYSIS/PERMA CATHETER INSERTION;  Surgeon: Algernon Huxley, MD;  Location: [place] [facility];  Service: Cardiovascular;  Laterality: N/A;   DIALYSIS/PERMA CATHETER INSERTION N/A 09/17/2019   Procedure: DIALYSIS/PERMA CATHETER EXCHANGE;  Surgeon: Katha Cabal, MD;  Location: [place] [facility];  Service: Cardiovascular;  Laterality: N/A;   DIALYSIS/PERMA CATHETER REMOVAL N/A 03/18/2020   Procedure: DIALYSIS/PERMA CATHETER REMOVAL;  Surgeon: Algernon Huxley, MD;  Location: [place] [facility];  Service: Cardiovascular;  Laterality: N/A;   LIGATION OF ARTERIOVENOUS  FISTULA Left 06/03/2021   Procedure: LIGATION OF ARTERIOVENOUS  FISTULA;  Surgeon: Algernon Huxley, MD;  Location: [facility];  Service: Vascular;  Laterality: Left;   LOWER EXTREMITY ANGIOGRAPHY Left 12/02/2020   Procedure: LOWER EXTREMITY ANGIOGRAPHY;  Surgeon: Algernon Huxley, MD;  Location: [place] [facility];  Service: Cardiovascular;  Laterality: Left;   LOWER EXTREMITY ANGIOGRAPHY Right 01/25/2021   Procedure: LOWER EXTREMITY ANGIOGRAPHY;  Surgeon: Algernon Huxley, MD;  Location: [place] [facility];  Service: Cardiovascular;  Laterality: Right;   LOWER EXTREMITY ANGIOGRAPHY Left 04/15/2021   Procedure: LOWER EXTREMITY ANGIOGRAPHY;  Surgeon: Algernon Huxley, MD;  Location: [place] [facility];  Service: Cardiovascular;  Laterality: Left;   LOWER EXTREMITY ANGIOGRAPHY Right 05/20/2021   Procedure: LOWER EXTREMITY ANGIOGRAPHY;  Surgeon: Algernon Huxley, MD;  Location: [place] [facility];  Service: Cardiovascular;  Laterality: Right;   LOWER EXTREMITY ANGIOGRAPHY Left 05/27/2021   Procedure: LOWER EXTREMITY ANGIOGRAPHY;  Surgeon: Algernon Huxley, MD;  Location: [place] [facility];  Service: Cardiovascular;  Laterality: Left;   LOWER EXTREMITY ANGIOGRAPHY Right 08/04/2021   Procedure: LOWER EXTREMITY ANGIOGRAPHY;  Surgeon: Algernon Huxley, MD;  Location: [place] [facility];  Service: Cardiovascular;  Laterality: Right;   LOWER EXTREMITY ANGIOGRAPHY Left 11/15/2021   Procedure: LOWER EXTREMITY ANGIOGRAPHY;  Surgeon: Algernon Huxley, MD;  Location: [place] [facility];  Service: Cardiovascular;  Laterality: Left;   pd  cath  05/21/2019   TENDON LENGTHENING Left 09/08/2021   Procedure: TENDON ACHILLES LENGTHENING;  Surgeon: Criselda Peaches, DPM;  Location: [facility];  Service: Podiatry;  Laterality: Left;   TONSILLECTOMY     TRANSMETATARSAL AMPUTATION Left 09/08/2021   Procedure: TRANSMETATARSAL AMPUTATION;  Surgeon: Criselda Peaches, DPM;  Location: [facility];  Service: Podiatry;  Laterality: Left;   UPPER EXTREMITY ANGIOGRAPHY Left 04/15/2021   Procedure: UPPER EXTREMITY ANGIOGRAPHY;  Surgeon: Algernon Huxley, MD;  Location: [place] [facility];  Service: Cardiovascular;  Laterality: Left;   WOUND DEBRIDEMENT Bilateral 09/15/2019   Procedure: DEBRIDEMENT WOUND;  Surgeon: Benjamine Sprague, DO;  Location: [facility];  Service: General;  Laterality: Bilateral;     Social History:   reports that she has quit smoking. Her smoking use included cigars. She has never used smokeless tobacco. She reports that she does not drink alcohol and does not use drugs.   Family History:  Her family history includes CAD in her mother; Cancer in her maternal grandfather and maternal grandmother; Hypertension in her mother; Kidney disease in her father; Multiple sclerosis in her mother.   Allergies Allergies  Allergen Reactions   Percocet [Oxycodone-Acetaminophen] Itching   Tomato Itching   Gabapentin Other (See Comments)    Extremity tremors     Home Medications  Prior to Admission medications   Medication Sig Start Date End Date Taking? Authorizing  Provider  amLODipine (NORVASC) 10 MG tablet Take 10 mg by mouth daily as needed (elevated blood pressure).   Yes [provider]  aspirin EC 81 MG EC tablet Take 1 tablet (81 mg total) by mouth daily. 02/16/16  Yes Demetrios Loll, MD  atorvastatin (LIPITOR) 20 MG tablet Take 1 tablet (20 mg total) by mouth daily. 11/15/21  Yes Dew, Erskine Squibb, MD  carvedilol (COREG) 25 MG tablet Take 25 mg by mouth 2 (two) times daily as needed (elevated blood pressure).   Yes [provider]  clopidogrel (PLAVIX) 75 MG tablet Take 1 tablet (75 mg total) by mouth daily. 11/15/21  Yes Dew, Erskine Squibb, MD  isosorbide mononitrate (IMDUR) 30 MG 24 hr tablet Take 30 mg by mouth 2 (two) times daily as needed (elevated blood pressure).   Yes [provider]  losartan (COZAAR) 100 MG tablet Take 100 mg by mouth daily as needed (elevated blood pressure).   Yes [provider]  cilostazol (PLETAL) 100 MG tablet Take 1 tablet (100 mg total) by mouth 2 (two) times daily before a meal. Patient not taking: Reported on 12/02/2021 05/07/21   Kris Hartmann, NP  lactulose (CHRONULAC) 10 GM/15ML solution Take 30 mLs (20 g total) by mouth 2 (two) times daily as needed for mild constipation. Patient not taking: Reported on 12/03/2021 07/09/21   Edwin Dada, MD  multivitamin (RENA-VIT) TABS tablet Take 1 tablet by mouth at bedtime. Patient not taking: Reported on 11/15/2021 09/18/19   Fritzi Mandes, MD  ondansetron (ZOFRAN) 4 MG tablet Take 1 tablet (4 mg total) by mouth every 6 (six) hours as needed for nausea. Patient not taking: Reported on 11/09/2021 09/08/21   Criselda Peaches, DPM     Critical care time: 45 minutes    Darel Hong, AGACNP-BC [facility] Prefer epic messenger for cross cover needs If after hours, please call E-link

## 2021-12-02 NOTE — Consult Note (Signed)
[place] for heparin Indication:  Thrombus  Allergies  Allergen Reactions   Percocet [Oxycodone-Acetaminophen] Itching   Tomato Itching   Gabapentin Other (See Comments)    Extremity tremors    Patient Measurements: Height: 5\' 6"  (167.6 cm) Weight: (!) 138 kg (304 lb 3.8 oz) IBW/kg (Calculated) : 59.3 Heparin Dosing Weight: 92.3 Kg  Vital Signs: Temp: 98.2 F (36.8 C) (02/16 2000) Temp Source: Esophageal (02/16 1900) Pulse Rate: 78 (02/16 2000)  Labs: Recent Labs    11/30/21 0430 11/30/21 1339 11/30/21 2226 12/06/2021 0338 12/02/2021 0532 12/09/2021 0601 11/18/2021 1810 11/22/2021 2135 12/02/21 0407 12/02/21 1600 12/02/21 2046  HGB 8.8*  --   --  9.2*  --   --   --  9.1* 9.0*  --   --   HCT 28.9*  --   --  30.1*  --   --   --  29.6* 28.4*  --   --   PLT 267  --   --  253  --   --   --   --  231  --   --   HEPARINUNFRC  --    < > 0.26*  --   --  0.24*  --   --   --   --  0.42  CREATININE  --   --   --  10.30*  --   --  10.56*  --  7.62* 5.59*  --   CKTOTAL  --   --   --   --   --  913*  --   --   --   --   --   TROPONINIHS  --   --   --  269* 269*  --   --   --   --   --   --    < > = values in this interval not displayed.     Estimated Creatinine Clearance: 19.9 mL/min (A) (by C-G formula based on SCr of 5.59 mg/dL (H)).   Medical History: Past Medical History:  Diagnosis Date   Anemia of chronic disease    Asthma    well controlled-no inhaler   Calciphylaxis 2020   CHF (congestive heart failure) ([place])    CKD (chronic kidney disease), stage III ([place])    Diabetes ([place])    type 2-diet controlled   Dyspnea    Hypertension    Morbid obesity ([place])    Nephrotic syndrome    PAD (peripheral artery disease) ([place])    Sepsis ([place]) 06/2021    Medications:  No chronic anticoagulation PTA Heparin Dosing Weight: 92.3 Kg  Assessment: Pharmacy has been consulted to initiate and monitor heparin in 35yo patient admitted with  stroke-like symptoms. Her symptoms began to resolve, but neurologist noted concern for lower extremity ischemia, likely embolic. H&H, platelets have been stable. 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  Goal of Therapy:  Heparin level 0.3-0.5 units/ml (Per Stroke Protocol) Monitor platelets by anticoagulation protocol: Yes  Date Time HL  Comment/rate 2/10 2222 HL < 0.10, subtherapeutic 2/11 0906 HL 0.12, subtherapeutic 2/11 2048 HL 0.22, subtherapeutic  2/12 0602 HL 0.44, therapeutic x 1 2/12 1500 HL 0.29, subtherapeutic; 2100>2200 un/hr 2/12 2356 HL 0.36, therapeutic x 1; 2200 un/hr 2/13 0916 HL 0.40, therapeutic x2; 2200 un/hr 2/14 0314 HL 0.51: 2200 >> 2100 units/hr 2/14 1339 HL 0.32: 2100 units/hr 2/14 2226 HL 0.26: 2100 >>2200 units/hr 2/15 0601 HL  0.24  2200 >>2300 units/hr 2/16 2046 HL 0.42 Therapeutic x 1 @ 2300 units/hr   Plan:  2/16 2046 HL 0.42 Therapeutic x 1 @ 2300 units/hr Continue heparin infusion at 2300 units/hr Recheck level 8 hrs to confirm Daily CBC while on IV heparin  Dorothe Pea, PharmD, BCPS Clinical Pharmacist   12/02/2021 9:29 PM

## 2021-12-02 NOTE — Progress Notes (Addendum)
Nutrition Follow Up Note   DOCUMENTATION CODES:   Morbid obesity  INTERVENTION:   Vital 1.2 _0 /hr- continue 45m/hr, once pt has BM, advance by 167mhr q 8 hours until goal rate is reached.   Pro-Source 453mID via tube, provides 40kcal and 11g of protein per serving   Free water flushes 41m60m hours to maintain tube patency   Regimen provides 1848kcal/day, 141g/day protein and 1348ml25m of free water   Pt at high refeed risk; recommend monitor potassium, magnesium and phosphorus labs daily until stable  Rena-vit daily via tube   Vitamin C 500mg 65mvia tube   Check Vitamin D level   NUTRITION DIAGNOSIS:   Inadequate oral intake related to inability to eat (pt sedated and ventilated) as evidenced by NPO status.  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 OGT in place; pt tolerating trickle feeds currently. No BM since 2/12; lactulose started today. Pt remains on multiple pressors. Pt continues on CRRT. CT scan reports worsening of stroke. Palliative care consult pending; family deciding about GOC. Will plan to increase to goal rate after BM. Pt will need TEE once more stable. Per chart, pt up ~15lbs since admit; pt +13.0L on her I & Os. Pt with secondary hyperparathyroidism; PTH 1882, will check vitamin D.   Medications reviewed and include: vtiamin C, colace, solu-cortef, insulin, lactulose, rena-vit, protonix, miralax, senokot, cefepime, metronidazole, levophed, vancomycin,  vasopressin   Labs reviewed: Na 133(L), K 4.2 wnl, BUN 45(H), creat 7.62(H), Mg 2.2 wnl P 5.8(H)- 2/15 Wbc- 39.7(H), Hgb 9.0(L), Hct 28.4(L) Cbgs- 143, 153 x 24 hrs  Patient is currently intubated on ventilator support MV: 9.2 L/min Temp (24hrs), Avg:98.2 F (36.8 C), Min:97.2 F (36.2 C), Max:99 F (37.2 C)  Propofol: none   MAP- >60mmHg35miet Order:   Diet Order     None      EDUCATION NEEDS:   No education needs have been identified at this time  Skin:  Skin Assessment: Reviewed RN Assessment (closed incision L toe)  Last BM:  2/12- type 6  Height:   Ht Readings from Last 1 Encounters:  11/27/21 _1  (1.676 m)    Weight:   Wt Readings from Last 1 Encounters:  12/02/21 (!) 138 kg    Ideal Body Weight:  59 kg  BMI:  Body mass index is 49.1 kg/m.  Estimated Nutritional Needs:   Kcal:  1760kcal/day  Protein:  120-150g/day  Fluid:  UOP +1L  Tomy Khim CKoleen Distance, LDN Please refer to [facility] and/or RD on-call/weekend/after hours pager

## 2021-12-02 NOTE — Progress Notes (Signed)
[facility]  ROUNDING NOTE   Subjective:   [name] was admitted to [facility] on 11/19/2021 for PVD (peripheral vascular disease) ([place]) [I73.9] Pain [R52] Dysarthria [R47.1] Acute CVA (cerebrovascular accident) [facility]) [I63.9] Cerebrovascular 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 ([place]) [I63.40] COVID-19 [U07.1]  Patient remains critically ill, intubated and sedated.   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  FiO2 50% GI: Trickle feeds to be started today Renal-continued on CRRT  Objective:  Vital signs in last 24 hours:  Temp:  [97 F (36.1 C)-99 F (37.2 C)] 98.4 F (36.9 C) (02/16 0900) Pulse Rate:  [80-98] 80 (02/16 0900) Resp:  [11-25] 20 (02/16 0900) BP: (69-85)/(39-71) 69/39 (02/15 2100) SpO2:  [90 %-99 %] 98 % (02/16 0900) Arterial Line BP: (89-144)/(58-99) 99/79 (02/16 0900) FiO2 (%):  [50 %] 50 % (02/16 0900) Weight:  [355 kg] 138 kg (02/16 0317)  Weight change: 6.3 kg Filed Weights   11/30/21 0500 11/30/21 1809 12/02/21 0317  Weight: 131.7 kg 131.7 kg (!) 138 kg    Intake/Output: I/O last 3 completed shifts: In: 7303.7 [I.V.:5943.2; Other:480; NG/GT:580; IV Piggyback:300.5] Out: 2368 [Drains:100; DDUKG:2542; Blood:50]   Intake/Output this shift:  Total I/O In: 319.4 [I.V.:249.4; NG/GT:70] Out: 320 [Other:320]  Physical Exam: General: Laying in bed  Head: ET tube in place  Lungs:  Ventilator assisted,  Heart: Regular  Abdomen:  Soft, nontender, PD catheter, obese  Extremities:  B/l BKA  Neurologic: Sedated  Skin: Warm  Access: PD catheter  Right femoral temporary dialysis catheter 11/29/2021.  Dr. [name]  Basic Metabolic Panel: Recent Labs  Lab 11/28/21 0602 11/29/21 7062 11/30/21 3762 11/21/2021 0338 12/05/2021 0601 12/06/2021 1810 12/02/21 0407  NA 136 134* 135 132*  --  133* 133*  K 4.0 3.5 3.6 3.7   --  4.4 4.2  CL 93* 93* 95* 93*  --  91* 95*  CO2 18* 15* 20* 18*  --  20* 23  GLUCOSE 108* 152* 128* 154*  --  148* 183*  BUN 67* 64* 56* 55*  --  53* 45*  CREATININE 11.50* 11.44* 10.89* 10.30*  --  10.56* 7.62*  CALCIUM 7.9* 7.8* 8.1* 8.1*  --  8.3* 8.5*  MG 1.5* 1.6* 2.0  --  1.8  --  2.2  PHOS 10.0* 9.3* 10.8*  --   --  5.8*  --     Liver Function Tests: Recent Labs  Lab 11/28/21 0602 11/29/21 0342 11/30/21 0314 11/30/2021 0338 11/23/2021 1810 12/02/21 0407  AST 11* 12* 10* 20  --  36  ALT 6 <5 5 6   --  8  ALKPHOS 120 132* 129* 135*  --  141*  BILITOT 1.0 1.0 0.6 0.9  --  0.9  PROT 6.5 6.6 6.8 6.8  --  6.4*  ALBUMIN 2.8* 2.7* 2.7* 2.5* 2.1* 2.1*   No results for input(s): LIPASE, AMYLASE in the last 168 hours. No results for input(s): AMMONIA in the last 168 hours.  CBC: Recent Labs  Lab 12/05/2021 1320 12/11/2021 1412 11/27/21 0528 11/28/21 0602 11/29/21 0342 11/30/21 0430 12/14/2021 0338 11/28/2021 2135 12/02/21 0407  WBC 17.2* 16.6* 14.4* 17.4* 23.3* 30.5* 36.5*  --  39.7*  NEUTROABS 14.0* 14.0* 11.8*  --   --   --   --   --   --   HGB 9.2* 9.4* 8.6* 8.6* 8.5* 8.8* 9.2* 9.1* 9.0*  HCT 28.4* 29.6*  27.4* 27.4* 27.5* 28.9* 30.1* 29.6* 28.4*  MCV 99.6 98.7 100.4* 100.4* 100.0 101.8* 102.4*  --  98.3  PLT 280 247 242 253 274 267 253  --  231    Cardiac Enzymes: Recent Labs  Lab 11/20/2021 0601  CKTOTAL 913*    BNP: Invalid input(s): POCBNP  CBG: Recent Labs  Lab 11/20/2021 1514 11/25/2021 1917 11/29/2021 2303 12/02/21 0307 12/02/21 0745  GLUCAP 138* 114* 133* 153* 143*    Microbiology: Results for orders placed or performed during the hospital encounter of 12/06/2021  Resp Panel by RT-PCR (Flu A&B, Covid) Nasopharyngeal Swab     Status: Abnormal   Collection Time: 11/28/2021  2:12 PM   Specimen: Nasopharyngeal Swab; Nasopharyngeal(NP) swabs in vial transport medium  Result Value Ref Range Status   SARS Coronavirus 2 by RT PCR POSITIVE (A) NEGATIVE Final     Comment: (NOTE) SARS-CoV-2 target nucleic acids are DETECTED.  The SARS-CoV-2 RNA is generally detectable in upper respiratory specimens during the acute phase of infection. Positive results are indicative of the presence of the identified virus, but do not rule out bacterial infection or co-infection with other pathogens not detected by the test. Clinical correlation with patient history and other diagnostic information is necessary to determine patient infection status. The expected result is Negative.  Fact Sheet for Patients: EntrepreneurPulse.com.au  Fact Sheet for Healthcare Providers: IncredibleEmployment.be  This test is not yet approved or cleared by the [place] FDA and  has been authorized for detection and/or diagnosis of SARS-CoV-2 by FDA under an Emergency Use Authorization (EUA).  This EUA will remain in effect (meaning this test can be used) for the duration of  the COVID-19 declaration under Section 564(b)(1) of the A ct, 21 U.S.C. section 360bbb-3(b)(1), unless the authorization is terminated or revoked sooner.     Influenza A by PCR NEGATIVE NEGATIVE Final   Influenza B by PCR NEGATIVE NEGATIVE Final    Comment: (NOTE) The Xpert Xpress SARS-CoV-2/FLU/RSV plus assay is intended as an aid in the diagnosis of influenza from Nasopharyngeal swab specimens and should not be used as a sole basis for treatment. Nasal washings and aspirates are unacceptable for Xpert Xpress SARS-CoV-2/FLU/RSV testing.  Fact Sheet for Patients: EntrepreneurPulse.com.au  Fact Sheet for Healthcare Providers: IncredibleEmployment.be  This test is not yet approved or cleared by the [place] FDA and has been authorized for detection and/or diagnosis of SARS-CoV-2 by FDA under an Emergency Use Authorization (EUA). This EUA will remain in effect (meaning this test can be used) for the duration of  the COVID-19 declaration under Section 564(b)(1) of the Act, 21 U.S.C. section 360bbb-3(b)(1), unless the authorization is terminated or revoked.  Performed at [facility], [address] [place], [place] [address]   CULTURE, BLOOD (ROUTINE X 2) w Reflex to ID Panel     Status: None   Collection Time: 11/27/21 10:32 AM   Specimen: BLOOD  Result Value Ref Range Status   Specimen Description BLOOD BLOOD RIGHT HAND  Final   Special Requests   Final    BOTTLES DRAWN AEROBIC AND ANAEROBIC Blood Culture adequate volume   Culture   Final    NO GROWTH 5 DAYS Performed at [facility], [address] [place], [place] [address]    Report Status 12/02/2021 FINAL  Final  CULTURE, BLOOD (ROUTINE X 2) w Reflex to ID Panel     Status: None   Collection Time: 11/27/21 10:32 AM   Specimen: BLOOD  Result  Value Ref Range Status   Specimen Description BLOOD BLOOD RIGHT ARM  Final   Special Requests   Final    BOTTLES DRAWN AEROBIC AND ANAEROBIC Blood Culture adequate volume   Culture   Final    NO GROWTH 5 DAYS Performed at [facility], [address], [place], [place] [address]    Report Status 12/02/2021 FINAL  Final  MRSA Next Gen by PCR, Nasal     Status: None   Collection Time: 11/27/21  4:24 PM   Specimen: Nasal Mucosa; Nasal Swab  Result Value Ref Range Status   MRSA by PCR Next Gen NOT DETECTED NOT DETECTED Final    Comment: (NOTE) The GeneXpert MRSA Assay (FDA approved for NASAL specimens only), is one component of a comprehensive MRSA colonization surveillance program. It is not intended to diagnose MRSA infection nor to guide or monitor treatment for MRSA infections. Test performance is not FDA approved in patients less than 36 years old. Performed at [facility], [address] [place], [place] [address]   CULTURE, BLOOD (ROUTINE X 2) w Reflex to ID Panel     Status: None (Preliminary result)   Collection Time: 11/28/21 11:42 AM    Specimen: BLOOD  Result Value Ref Range Status   Specimen Description BLOOD BLOOD RIGHT HAND  Final   Special Requests   Final    BOTTLES DRAWN AEROBIC AND ANAEROBIC Blood Culture adequate volume   Culture   Final    NO GROWTH 4 DAYS Performed at [facility], [address], [place], [place] [address]    Report Status PENDING  Incomplete  CULTURE, BLOOD (ROUTINE X 2) w Reflex to ID Panel     Status: None (Preliminary result)   Collection Time: 11/28/21 11:42 AM   Specimen: BLOOD  Result Value Ref Range Status   Specimen Description BLOOD RIGHT ANTECUBITAL  Final   Special Requests   Final    AEROBIC BOTTLE ONLY Blood Culture results may not be optimal due to an inadequate volume of blood received in culture bottles   Culture   Final    NO GROWTH 4 DAYS Performed at [facility], [address] [place], [place] [address]    Report Status PENDING  Incomplete  Wet prep, genital     Status: Abnormal   Collection Time: 11/29/21  9:16 AM   Specimen: Vaginal  Result Value Ref Range Status   Yeast Wet Prep HPF POC NONE SEEN NONE SEEN Final   Trich, Wet Prep NONE SEEN NONE SEEN Final   Clue Cells Wet Prep HPF POC PRESENT (A) NONE SEEN Final   WBC, Wet Prep HPF POC >=10 (A) <10 Final   Sperm NONE SEEN  Final    Comment: Performed at [facility], [address] [place], [place] [address]  Culture, Respiratory w Gram Stain     Status: None (Preliminary result)   Collection Time: 11/21/2021  2:51 PM   Specimen: Tracheal Aspirate; Respiratory  Result Value Ref Range Status   Specimen Description   Final    TRACHEAL ASPIRATE Performed at [facility], [address] [place], [place] [address]    Special Requests   Final    NONE Performed at [facility], [address] [place], [place] [address]    Gram Stain   Final    FEW WBC PRESENT, PREDOMINANTLY MONONUCLEAR NO ORGANISMS SEEN    Culture   Final    NO GROWTH <  12  HOURS Performed at [facility], [place] [address], [place], [place] [address]    Report Status PENDING  Incomplete    Coagulation Studies: No results for input(s): LABPROT, INR in the last 72 hours.   Urinalysis: No results for input(s): COLORURINE, LABSPEC, PHURINE, GLUCOSEU, HGBUR, BILIRUBINUR, KETONESUR, PROTEINUR, UROBILINOGEN, NITRITE, LEUKOCYTESUR in the last 72 hours.  Invalid input(s): APPERANCEUR    Imaging: DG Chest 1 View  Result Date: 12/07/2021 CLINICAL DATA:  Central line placement EXAM: CHEST  1 VIEW COMPARISON:  12/14/2021 at 3:47 a.m. FINDINGS: Endotracheal tube tip is somewhat indistinct but thought to be about 2.4 cm above the carina. Nasogastric tube indistinct but appears to enter the stomach. A left internal jugular central venous catheter is present tip projecting over the SVC. Low lung volumes are present, causing crowding of the pulmonary vasculature. Stable indistinct airspace opacity at the lung bases, right greater than left. The patient is rotated to the left on today's radiograph, reducing diagnostic sensitivity and specificity. IMPRESSION: 1. Left central line tip projects over the SVC. No visible pneumothorax. 2. Stable bibasilar airspace opacities, right greater than left. Low lung volumes. Electronically Signed   By: Van Clines M.D.   On: 11/28/2021 13:11   DG Chest 1 View  Result Date: 12/13/2021 CLINICAL DATA:  Pneumothorax EXAM: CHEST  1 VIEW COMPARISON:  11/30/2021 FINDINGS: Endotracheal tube and NG tube remain in place unchanged. Cardiomegaly with vascular congestion. Bilateral airspace opacities slightly worsened since prior study, likely worsening edema. Decreasing lung volumes. No effusions. No acute bony abnormality. IMPRESSION: Cardiomegaly with mild vascular congestion and edema/CHF, slightly worsening since prior study. Low lung volumes. Electronically Signed   By: Rolm Baptise M.D.   On: 11/23/2021 03:55   CT HEAD WO CONTRAST  (5MM)  Result Date: 11/25/2021 CLINICAL DATA:  Follow up stroke EXAM: CT HEAD WITHOUT CONTRAST TECHNIQUE: Contiguous axial images were obtained from the base of the skull through the vertex without intravenous contrast. RADIATION DOSE REDUCTION: This exam was performed according to the departmental dose-optimization program which includes automated exposure control, adjustment of the mA and/or kV according to patient size and/or use of iterative reconstruction technique. COMPARISON:  Multiple exams, including 11/27/2021 and MRI brain from 12/03/2021 FINDINGS: Brain: Abnormal hypodensity and loss of gray-white differentiation throughout the right occipital lobe, a substantial portion of the right temporal lobe, and much of the right parietal lobe compatible with a large distribution infarct involving PCA and posterior MCA distribution on the right, substantially larger than the small perfusion defects seen on the right side on the MRI from 11/17/2021. There is also substantial vasogenic edema potentially some cytotoxic edema posteriorly in the left frontal lobe, substantially disproportionate to the small infarcts shown on the 11/25/2021 exam likewise suspicious for progressive infarcts. No midline shift. New patchy white matter hypodensities in both frontal lobes have substantially increased from 11/27/2021 and are concerning for potential white matter infarcts or associated vasogenic edema. This includes hypodensities along the internal capsules and periventricular white matter. Strictly speaking in the context of the patient's lower extremity ischemia, infection is not completely excluded although ischemia seems more likely given the other findings intracranially. No large amount of intracranial hemorrhage. No hydrocephalus or effacement of the basilar cisterns. Vascular: No hyperdense MCA. Widespread age advanced atherosclerotic vascular calcifications as can be encountered in the setting of diabetes. Skull:  Right frontal osteoma noted. Patchy demineralization in the calvarium is unchanged. Sinuses/Orbits: Unremarkable Other: No supplemental non-categorized findings. IMPRESSION: 1. Large  subacute infarct involving the right temporal, parietal, and occipital lobes. Indistinct subacute infarct in the left central MCA distribution, posteriorly in the left frontal lobe. Patchy new white matter hypodensities along the internal capsules and periventricular white matter concerning for additional ischemic lesions in this clinical context. Overall this represents a marked worsening/progression compared to prior images of 12/09/2021 and 11/27/2021. No current overt intracranial hematoma identified. No current hydrocephalus or effacement of the basilar cisterns. These results were called by telephone at the time of interpretation on 11/27/2021 at 4:38 pm to provider DANA NELSON , who verbally acknowledged these results. Electronically Signed   By: Van Clines M.D.   On: 11/27/2021 16:45     Medications:     prismasol BGK 4/2.5 400 mL/hr at 12/02/21 0747    prismasol BGK 4/2.5 400 mL/hr at 12/02/21 0747   sodium chloride 5 mL/hr at 12/02/21 0854   ceFEPime (MAXIPIME) IV Stopped (12/14/2021 2153)   fentaNYL infusion INTRAVENOUS 50 mcg/hr (12/02/21 0900)   heparin Stopped (11/24/2021 1130)   metronidazole Stopped (12/02/21 0009)   midazolam Stopped (11/20/2021 1800)   norepinephrine (LEVOPHED) Adult infusion 17 mcg/min (12/02/21 0900)   phenylephrine (NEO-SYNEPHRINE) Adult infusion     prismasol BGK 4/2.5 1,500 mL/hr at 12/02/21 2025   vancomycin     vasopressin 0.03 Units/min (12/02/21 0900)    vitamin C  500 mg Per Tube BID   chlorhexidine gluconate (MEDLINE KIT)  15 mL Mouth Rinse BID   Chlorhexidine Gluconate Cloth  6 each Topical Daily   collagenase   Topical Daily   docusate  100 mg Per Tube BID   feeding supplement (PROSource TF)  45 mL Per Tube TID   feeding supplement (VITAL AF 1.2 CAL)  1,000 mL Per  Tube Q24H   free water  30 mL Per Tube Q4H   gentamicin cream  1 application Topical Daily   hydrocortisone sod succinate (SOLU-CORTEF) inj  100 mg Intravenous Q8H   insulin aspart  0-6 Units Subcutaneous Q4H   ipratropium-albuterol  3 mL Nebulization Q6H   mouth rinse  15 mL Mouth Rinse 10 times per day   multivitamin  1 tablet Per Tube QHS   pantoprazole (PROTONIX) IV  40 mg Intravenous Q24H   polyethylene glycol  17 g Per Tube Daily   sennosides  5 mL Per Tube BID   sodium chloride, acetaminophen **OR** acetaminophen (TYLENOL) oral liquid 160 mg/5 mL **OR** acetaminophen, albuterol, fentaNYL, fentaNYL (SUBLIMAZE) injection, heparin sodium (porcine), sodium chloride  Assessment/ Plan:  [name] is a 36 y.o. black female with end stage renal disease on peritoneal dialysis, hypertension, congestive heart failure, diabetes mellitus type II, peripheral vascular disease who is admitted to [facility] on 11/25/2021 for PVD (peripheral vascular disease) ([place]) [I73.9] Pain [R52] Dysarthria [R47.1] Acute CVA (cerebrovascular accident) ([place]) [I63.9] Cerebrovascular 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 ([place]) [I63.40] COVID-19 [U07.1]  COVID-19 + Patient was admitted as a code stroke. She was scheduled for debridement of her left foot wound later that day. Procedure was cancelled.   [facility]itoneal [facility] Shanon Payor 132kg CCPD 9 hours 5 exchanges 3057m fills and last fill of 20054m End Stage Renal Disease: With hypotension Peritoneal dialysis on hold Undergoing CRRT now Electrolytes and volume status are acceptable No changes today.  Continue 4K bath.  Fluid balance net even May discontinue bicarbonate drip Requiring pressors for hypotension  Anemia of kidney disease:   Lab Results  Component Value Date   HGB  9.0 (L) 12/02/2021   Hold off on EPO due to ischemic event.   Secondary Hyperparathyroidism with hyperphosphatemia and calciphylaxis: Labs from 2/7: PTH  1882, calcium 8 and phos 8.1. was getting sodium thiosulfate as outpatient with peritoneal dialysis. - When patient is able to take PO, recommend restarting binders. Phosphorus level improving with dialysis  4. 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 Hypoglycemia on 2/10. Hemoglobin A1c of 5.1% on admission - monitor blood sugars  5. Altered mental status: with concerns for acute ischemic stroke.   MRI shows multiple chronic vessel small ischemic changes and lacunar infarcts concerning for embolic disease.  2D echo from November 27, 2021-LVEF 55 to 60%, mild LVH, elevated left atrial pressure and severe dilatation, restricted anterior and posterior mitral valve leaflet motion, mitral valve heavily thickened and calcified, could represent endocarditis, severe mitral stenosis, severe mitral annular calcifications, moderate to severe tricuspid regurgitation -Currently on broad-spectrum antibiotics for possible mitral valve endocarditis -On heparin for embolic stroke  6. Peripheral vascular disease and left foot osteomyelitis causing sepsis: Antibiotics as per podiatry and vascular.  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 COVID-19 infection:  Respiratory isolation  Remdesivir completed. Supportive care  8.  Acute respiratory failure Intubated 11/29/2021 Continued on ventilator support Management as per ICU team     LOS: [place] 2/16/20239:52 AM

## 2021-12-02 NOTE — Progress Notes (Signed)
[facility]  Daily Progress Note   Subjective  -   Remains intubated, sedated, on pressors and critically ill  Objective Vitals:   12/02/21 0700 12/02/21 0800 12/02/21 0855 12/02/21 0900  BP:      Pulse: 86 85  80  Resp: (!) 25 (!) 23  20  Temp: 98.6 F (37 C) 98.4 F (36.9 C)  98.4 F (36.9 C)  TempSrc: Esophageal Esophageal  Esophageal  SpO2: 97% 98% 99% 98%  Weight:      Height:        Intake/Output Summary (Last 24 hours) at 12/02/2021 0909 Last data filed at 12/02/2021 0900 Gross per 24 hour  Intake 4860.4 ml  Output 2688 ml  Net 2172.4 ml     CV  RRR VASC  Sites clean and dry  Laboratory CBC    Component Value Date/Time   WBC 39.7 (H) 12/02/2021 0407   HGB 9.0 (L) 12/02/2021 0407   HGB 12.5 12/31/2013 1020   HCT 28.4 (L) 12/02/2021 0407   HCT 38.5 12/31/2013 1020   PLT 231 12/02/2021 0407   PLT 239 12/31/2013 1020    BMET    Component Value Date/Time   NA 133 (L) 12/02/2021 0407   NA 135 (L) 12/31/2013 1020   K 4.2 12/02/2021 0407   K 3.9 12/31/2013 1020   CL 95 (L) 12/02/2021 0407   CL 105 12/31/2013 1020   CO2 23 12/02/2021 0407   CO2 26 12/31/2013 1020   GLUCOSE 183 (H) 12/02/2021 0407   GLUCOSE 243 (H) 12/31/2013 1020   BUN 45 (H) 12/02/2021 0407   BUN 19 (H) 12/31/2013 1020   CREATININE 7.62 (H) 12/02/2021 0407   CREATININE 2.12 (H) 12/31/2013 1020   CALCIUM 8.5 (L) 12/02/2021 0407   CALCIUM 8.5 12/31/2013 1020   GFRNONAA 7 (L) 12/02/2021 0407   GFRNONAA 31 (L) 12/31/2013 1020   GFRAA 5 (L) 06/18/2020 1217   GFRAA 36 (L) 12/31/2013 1020    Assessment/Planning: POD #1 s/p bilateral guillotine amputations  Remains critically ill, on pressors Vascular access is adequate If she improves, would consider formal amputations early next week No other recs at this point    [name]  12/02/2021, 9:09 AM

## 2021-12-03 ENCOUNTER — Inpatient Hospital Stay (HOSPITAL_COMMUNITY)
Admit: 2021-12-03 | Discharge: 2021-12-03 | Disposition: A | Discharge status: Home / Self Care | Payer: Medicare Other | Attending: Pulmonary Disease | Admitting: Pulmonary Disease

## 2021-12-03 ENCOUNTER — Inpatient Hospital Stay: Payer: Medicare Other

## 2021-12-03 DIAGNOSIS — I739 Peripheral vascular disease, unspecified: Secondary | ICD-10-CM | POA: Diagnosis not present

## 2021-12-03 DIAGNOSIS — N186 End stage renal disease: Secondary | ICD-10-CM | POA: Diagnosis not present

## 2021-12-03 DIAGNOSIS — D631 Anemia in chronic kidney disease: Secondary | ICD-10-CM | POA: Diagnosis not present

## 2021-12-03 DIAGNOSIS — R471 Dysarthria and anarthria: Secondary | ICD-10-CM | POA: Diagnosis not present

## 2021-12-03 DIAGNOSIS — I6340 Cerebral infarction due to embolism of unspecified cerebral artery: Secondary | ICD-10-CM | POA: Diagnosis not present

## 2021-12-03 DIAGNOSIS — I639 Cerebral infarction, unspecified: Secondary | ICD-10-CM | POA: Diagnosis not present

## 2021-12-03 DIAGNOSIS — Z992 Dependence on renal dialysis: Secondary | ICD-10-CM | POA: Diagnosis not present

## 2021-12-03 DIAGNOSIS — I6389 Other cerebral infarction: Secondary | ICD-10-CM

## 2021-12-03 DIAGNOSIS — D72829 Elevated white blood cell count, unspecified: Secondary | ICD-10-CM

## 2021-12-03 LAB — COMPREHENSIVE METABOLIC PANEL
ALT: 11 U/L (ref 0–44)
AST: 30 U/L (ref 15–41)
Albumin: 2.1 g/dL — ABNORMAL LOW (ref 3.5–5.0)
Alkaline Phosphatase: 131 U/L — ABNORMAL HIGH (ref 38–126)
Anion gap: 8 (ref 5–15)
BUN: 32 mg/dL — ABNORMAL HIGH (ref 6–20)
CO2: 24 mmol/L (ref 22–32)
Calcium: 8.5 mg/dL — ABNORMAL LOW (ref 8.9–10.3)
Chloride: 102 mmol/L (ref 98–111)
Creatinine, Ser: 4.26 mg/dL — ABNORMAL HIGH (ref 0.44–1.00)
GFR, Estimated: 13 mL/min — ABNORMAL LOW (ref >60)
Glucose, Bld: 187 mg/dL — ABNORMAL HIGH (ref 70–99)
Potassium: 4.3 mmol/L (ref 3.5–5.1)
Sodium: 134 mmol/L — ABNORMAL LOW (ref 135–145)
Total Bilirubin: 1.1 mg/dL (ref 0.3–1.2)
Total Protein: 6.3 g/dL — ABNORMAL LOW (ref 6.5–8.1)

## 2021-12-03 LAB — ENA+DNA/DS+ANTICH+CENTRO+JO...
Anti JO-1: <0.2 AI (ref 0.0–0.9)
Centromere Ab Screen: 7.8 AI — ABNORMAL HIGH (ref 0.0–0.9)
Chromatin Ab SerPl-aCnc: <0.2 AI (ref 0.0–0.9)
ENA SM Ab Ser-aCnc: <0.2 AI (ref 0.0–0.9)
Ribonucleic Protein: <0.2 AI (ref 0.0–0.9)
SSA (Ro) (ENA) Antibody, IgG: <0.2 AI (ref 0.0–0.9)
SSB (La) (ENA) Antibody, IgG: <0.2 AI (ref 0.0–0.9)
Scleroderma (Scl-70) (ENA) Antibody, IgG: <0.2 AI (ref 0.0–0.9)
ds DNA Ab: 1 IU/mL (ref 0–9)

## 2021-12-03 LAB — GLUCOSE, CAPILLARY
Glucose-Capillary: 150 mg/dL — ABNORMAL HIGH (ref 70–99)
Glucose-Capillary: 165 mg/dL — ABNORMAL HIGH (ref 70–99)
Glucose-Capillary: 188 mg/dL — ABNORMAL HIGH (ref 70–99)
Glucose-Capillary: 151 mg/dL — ABNORMAL HIGH (ref 70–99)
Glucose-Capillary: 117 mg/dL — ABNORMAL HIGH (ref 70–99)
Glucose-Capillary: 132 mg/dL — ABNORMAL HIGH (ref 70–99)

## 2021-12-03 LAB — CBC
HCT: 27 % — ABNORMAL LOW (ref 36.0–46.0)
Hemoglobin: 8.4 g/dL — ABNORMAL LOW (ref 12.0–15.0)
MCH: 31.2 pg (ref 26.0–34.0)
MCHC: 31.1 g/dL (ref 30.0–36.0)
MCV: 100.4 fL — ABNORMAL HIGH (ref 80.0–100.0)
Platelets: 228 10*3/uL (ref 150–400)
RBC: 2.69 MIL/uL — ABNORMAL LOW (ref 3.87–5.11)
RDW: 18.1 % — ABNORMAL HIGH (ref 11.5–15.5)
WBC: 34.3 10*3/uL — ABNORMAL HIGH (ref 4.0–10.5)
nRBC: 2.6 % — ABNORMAL HIGH (ref 0.0–0.2)

## 2021-12-03 LAB — CULTURE, BLOOD (ROUTINE X 2)
Culture: NO GROWTH
Culture: NO GROWTH
Special Requests: ADEQUATE

## 2021-12-03 LAB — RENAL FUNCTION PANEL
Albumin: 2 g/dL — ABNORMAL LOW (ref 3.5–5.0)
Anion gap: 4 — ABNORMAL LOW (ref 5–15)
BUN: 33 mg/dL — ABNORMAL HIGH (ref 6–20)
CO2: 26 mmol/L (ref 22–32)
Calcium: 8.2 mg/dL — ABNORMAL LOW (ref 8.9–10.3)
Chloride: 107 mmol/L (ref 98–111)
Creatinine, Ser: 4.02 mg/dL — ABNORMAL HIGH (ref 0.44–1.00)
GFR, Estimated: 14 mL/min — ABNORMAL LOW (ref >60)
Glucose, Bld: 175 mg/dL — ABNORMAL HIGH (ref 70–99)
Phosphorus: 5.8 mg/dL — ABNORMAL HIGH (ref 2.5–4.6)
Potassium: 4.9 mmol/L (ref 3.5–5.1)
Sodium: 137 mmol/L (ref 135–145)

## 2021-12-03 LAB — VITAMIN D 25 HYDROXY (VIT D DEFICIENCY, FRACTURES): Vit D, 25-Hydroxy: 28.76 ng/mL — ABNORMAL LOW (ref 30–100)

## 2021-12-03 LAB — FERRITIN: Ferritin: 621 ng/mL — ABNORMAL HIGH (ref 11–307)

## 2021-12-03 LAB — SODIUM
Sodium: 135 mmol/L (ref 135–145)
Sodium: 139 mmol/L (ref 135–145)

## 2021-12-03 LAB — BLOOD GAS, ARTERIAL
Acid-base deficit: 6.5 mmol/L — ABNORMAL HIGH (ref 0.0–2.0)
Bicarbonate: 22.8 mmol/L (ref 20.0–28.0)
O2 Saturation: 98.8 %
Patient temperature: 37
pCO2 arterial: 61 mmHg — ABNORMAL HIGH (ref 32–48)
pH, Arterial: 7.18 — CL (ref 7.35–7.45)
pO2, Arterial: 100 mmHg (ref 83–108)

## 2021-12-03 LAB — ANA W/REFLEX IF POSITIVE: Anti Nuclear Antibody (ANA): POSITIVE — AB

## 2021-12-03 LAB — MAGNESIUM: Magnesium: 2.4 mg/dL (ref 1.7–2.4)

## 2021-12-03 LAB — HEPARIN LEVEL (UNFRACTIONATED): Heparin Unfractionated: 0.48 IU/mL (ref 0.30–0.70)

## 2021-12-03 LAB — SURGICAL PATHOLOGY

## 2021-12-03 LAB — ECHOCARDIOGRAM LIMITED BUBBLE STUDY: S' Lateral: 4.5 cm

## 2021-12-03 LAB — C-REACTIVE PROTEIN: CRP: 27.1 mg/dL — ABNORMAL HIGH (ref <1.0)

## 2021-12-03 LAB — D-DIMER, QUANTITATIVE: D-Dimer, Quant: 1.82 ug/mL-FEU — ABNORMAL HIGH (ref 0.00–0.50)

## 2021-12-03 IMAGING — CT CT HEAD W/O CM
4 series · 15 of 47 positions shown, 17 images · non-contrast
Comparison: CT head [DATE]

CLINICAL DATA: Stroke follow-up



[Series 3: head wo · axial · 0.48mm/px · z∈[-154,-29]mm · 7 of 35 slices shown, 9 images]
[im 5/35  brain]
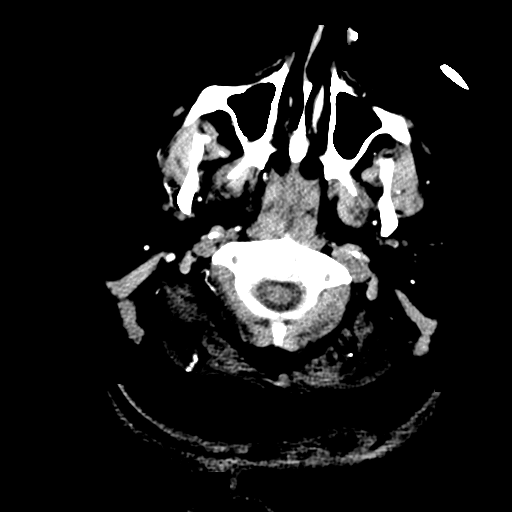
[im 5/35  bone]
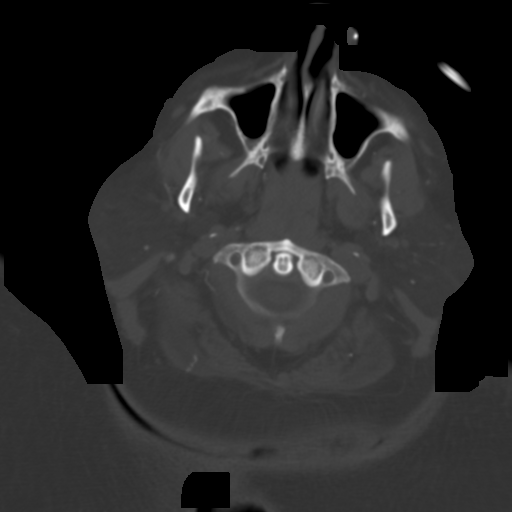
[im 9/35  brain]
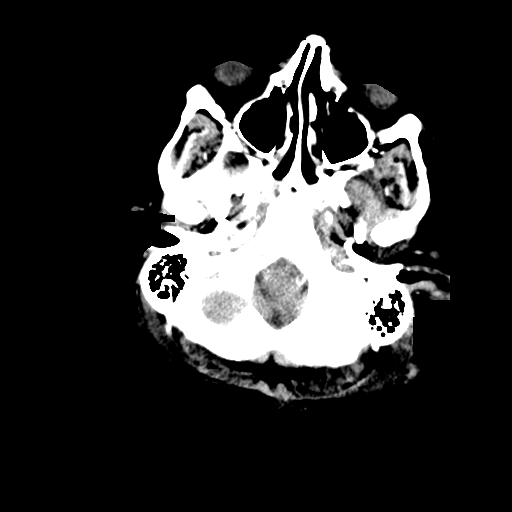
[im 13/35  brain]
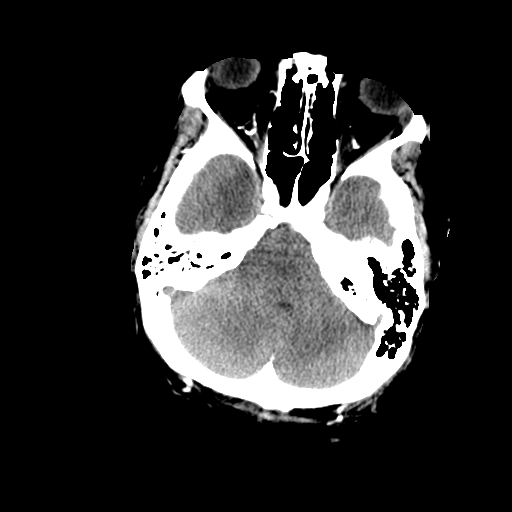
[im 18/35  brain]
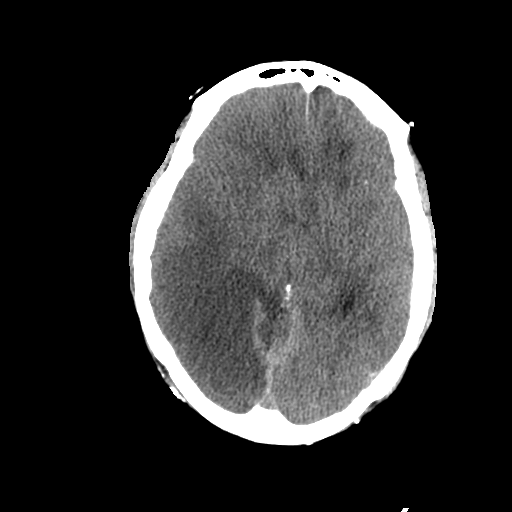
[im 22/35  brain]
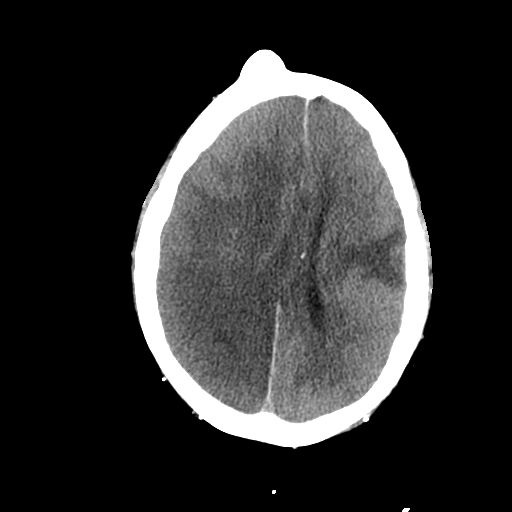
[im 22/35  bone]
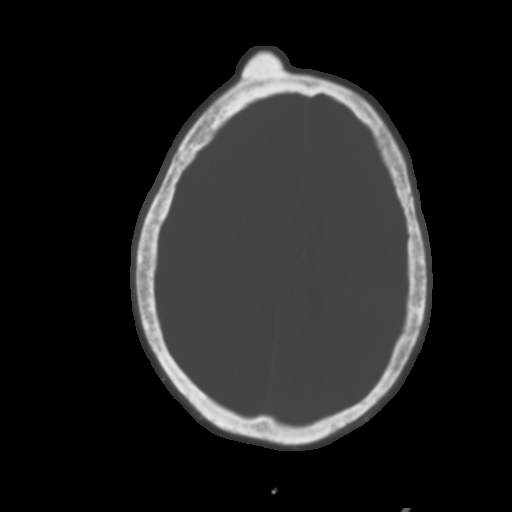
[im 26/35  brain]
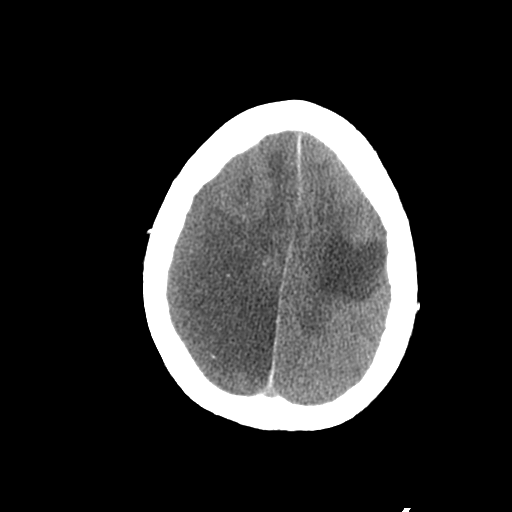
[im 30/35  brain]
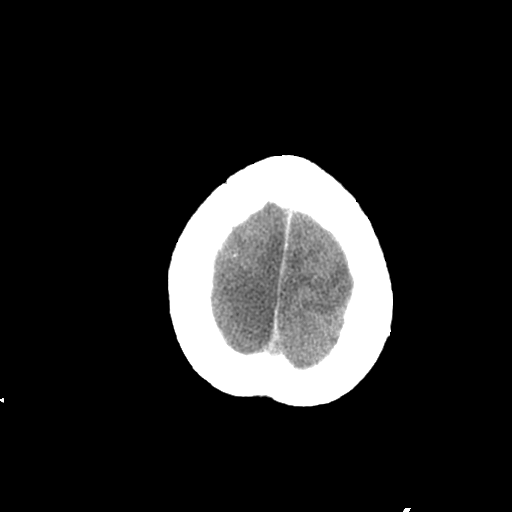

[Series 4: head bone · axial · 0.48mm/px · z∈[-158,-140]mm · 2 of 88 slices shown]
[im 9/88  bone]
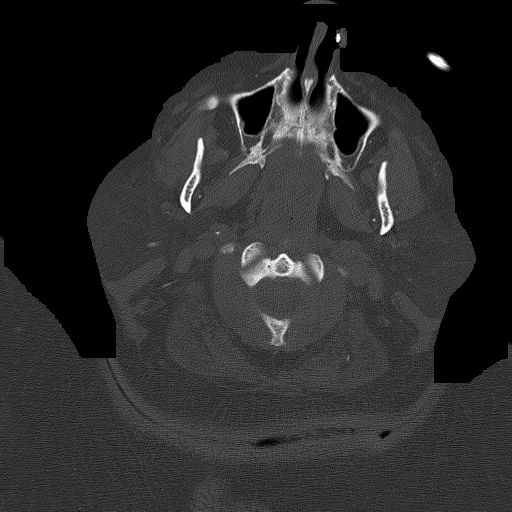
[im 18/88  bone]
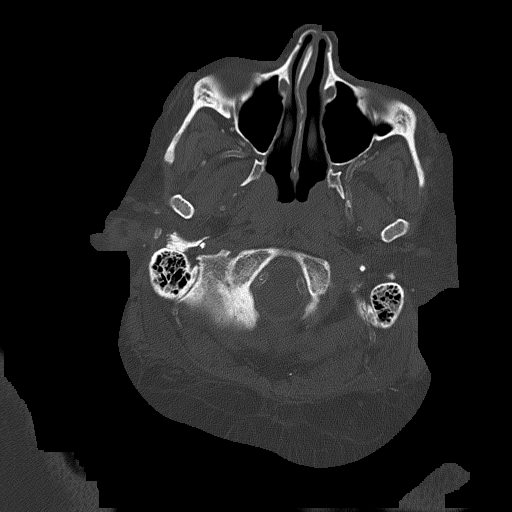

[Series 5: coronal soft tissue · coronal · 0.34mm/px · 3 of 76 slices shown]
[im 26/76  brain]
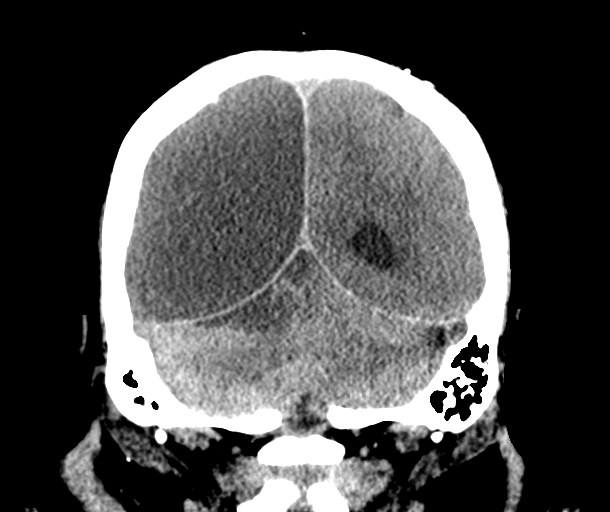
[im 34/76  brain]
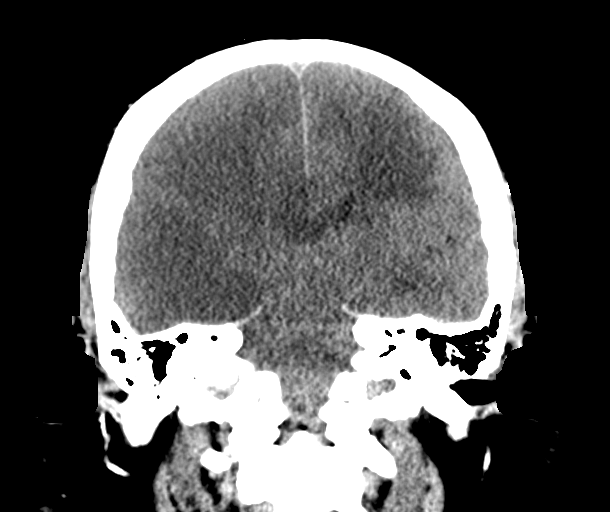
[im 42/76  brain]
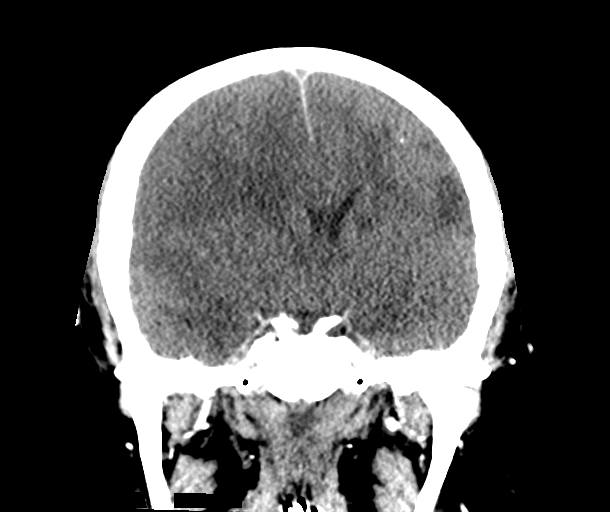

[Series 6: sagittal soft tissue · sagittal · 0.34mm/px · 3 of 69 slices shown]
[im 23/69  brain]
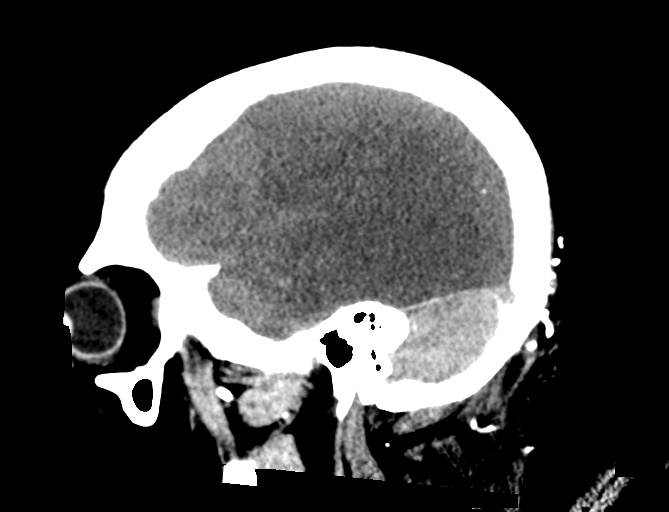
[im 35/69  brain]
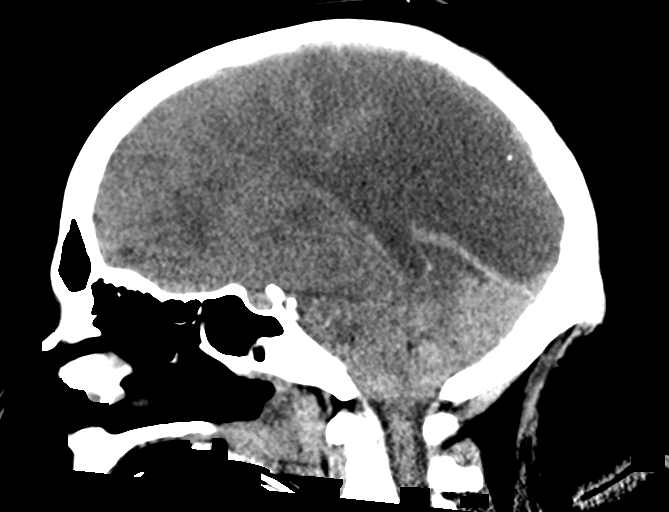
[im 46/69  brain]
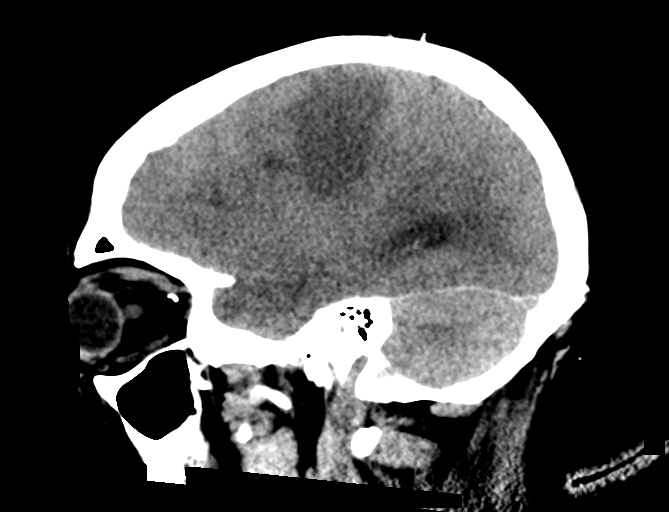

[15 of 47 positions shown; findings below may reference images not displayed]

FINDINGS: Brain: Extensive infarcts are again seen in the right cerebral
hemisphere involving the frontal, parietal, occipital, and temporal
lobes with increased involvement of the frontal lobe superiorly.
There is relative sparing of the anterior frontal lobe, with ongoing
evolution since [DATE]. There is increased indistinctness of the
basal ganglia.

There is ongoing evolution of the infarct in the left MCA
distribution. Mass effect from the right-sided infarcts has
increased, with sulcal effacement, near-complete effacement of the
right lateral ventricle, and new 9 mm midline shift. The basal
cisterns are effaced, and there is crowding of the posterior fossa.

There is a new infarct in the right cerebellar hemisphere in the SCA
distribution.

There is no evidence of acute intracranial hemorrhage.

Vascular: There is calcification of the bilateral cavernous ICAs and
vertebral arteries, advanced for age.

Skull: The right frontal osteoma is unchanged.

Sinuses/Orbits: The paranasal sinuses are clear. The globes and
orbits are unremarkable.

Other: Endotracheal and enteric catheters are partially imaged.
IMPRESSION: 1. Ongoing evolution of extensive ischemia in the right cerebral
hemisphere with increased indistinctness of the right basal ganglia,
and worsened ischemia in the right superior frontal lobe. There is
relative sparing of the anterior frontal lobe.
2. Additional evolving infarcts in the left MCA distribution.
3. New infarct in the right cerebellar hemisphere in the SCA
distribution.
4. Mass effect from the right-sided infarcts has increased, with new
effacement of the right lateral ventricle and basal cisterns, and
new 9 mm leftward midline shift.
5. No evidence of acute intracranial hemorrhage.

These results were called by telephone at the time of interpretation
on [DATE] at [DATE] to provider SUNDER , who verbally
acknowledged these results.

## 2021-12-03 MED ORDER — PRISMASOL BGK 0/2.5 32-2.5 MEQ/L EC SOLN
Status: DC
  Filled 2021-12-03 (×3): qty 5000

## 2021-12-03 MED ORDER — ATROPINE SULFATE 1 MG/ML IV SOLN
0.5000 mg | Freq: Once | INTRAVENOUS | Status: AC
  Administered 2021-12-03: 0.5 mg via INTRAVENOUS
  Filled 2021-12-03: qty 0.5

## 2021-12-03 MED ORDER — SODIUM CHLORIDE 3 % IV BOLUS
250.0000 mL | Freq: Once | INTRAVENOUS | Status: AC
  Administered 2021-12-03: 250 mL via INTRAVENOUS
  Filled 2021-12-03: qty 500

## 2021-12-03 MED ORDER — PRISMASOL BGK 0/2.5 32-2.5 MEQ/L EC SOLN
Status: DC
  Filled 2021-12-03 (×14): qty 5000

## 2021-12-03 MED ORDER — SODIUM CHLORIDE 3 % IV SOLN
INTRAVENOUS | Status: DC
  Filled 2021-12-03 (×10): qty 500

## 2021-12-03 MED ORDER — VITAL 1.5 CAL PO LIQD
1000.0000 mL | ORAL | Status: DC
  Administered 2021-12-03: 1000 mL

## 2021-12-03 MED ORDER — VITAMIN D 25 MCG (1000 UNIT) PO TABS: 2000.0000 [IU] | ORAL_TABLET | Freq: Every day | ORAL | Status: DC

## 2021-12-03 MED ORDER — LACTATED RINGERS IV BOLUS
1000.0000 mL | Freq: Once | INTRAVENOUS | Status: AC
  Administered 2021-12-03: 1000 mL via INTRAVENOUS

## 2021-12-03 NOTE — Consult Note (Signed)
Pharmacy Antibiotic Note  [name] is a 36 y.o. female admitted on 11/25/2021 with left foot wound infection. Patient had outpatient left foot debridement scheduled for today which was canceled due to concern for stroke. Pharmacy has been consulted for vancomycin & cefepime dosing. Patient was previously ESRD on PD and now receiving CRRT.  Plan:  1) continue cefepime 2 grams IV every 12  hours  2) continue vancomycin 1500 mg IV every 24 hours Today is the second scheduled vancomycin dose Check vancomycin trough level prior to 3rd dose on 12/14/2021 Trough goal 10 - 20 mcg/mL may recheck level every 3-5 days   Temp (24hrs), Avg:98.1 F (36.7 C), Min:97 F (36.1 C), Max:98.6 F (37 C)  Recent Labs  Lab 11/29/21 0342 11/30/21 0314 11/30/21 0430 11/30/21 1339 12/10/2021 0338 11/24/2021 0339 12/11/2021 0601 12/05/2021 1810 12/02/21 0407 12/02/21 1600 12/03/21 0359  WBC 23.3*  --  30.5*  --  36.5*  --   --   --  39.7*  --  34.3*  CREATININE 11.44*   < >  --   --  10.30*  --   --  10.56* 7.62* 5.59* 4.26*  LATICACIDVEN  --   --   --   --   --  2.9* 2.1*  --   --   --   --   VANCORANDOM  --   --   --  18  --   --   --   --   --   --   --    < > = values in this interval not displayed.     Estimated Creatinine Clearance: 26.1 mL/min (A) (by C-G formula based on SCr of 4.26 mg/dL (H)).    Allergies  Allergen Reactions   Percocet [Oxycodone-Acetaminophen] Itching   Tomato Itching   Gabapentin Other (See Comments)    Extremity tremors    Antimicrobials this admission: 2/10 remdesivir >> 2/14 2/10 cefepime >>  2/11 vancomycin >>  Microbiology results: 02/12 BCx: NG final 02/11 BCx: NG final 02/12 RCx: NG x 2 days (pending) 02/11 MRSA PCR: negative 02/10 SARS CoV-2: positive 02/10 influenza A/B: negative   Thank you for allowing pharmacy to be a part of this patients care.  Vallery Sa, PharmD, BCPS Clinical Pharmacist 12/03/2021 7:00 AM

## 2021-12-03 NOTE — Progress Notes (Signed)
[facility]  ROUNDING NOTE   Subjective:   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  FiO2 40% GI: Tube feeds at 20 cc/h Renal-continued on CRRT.  [phone] prefilter/dialysate/post filter replacement Blood flow rate 230  Objective:  Vital signs in last 24 hours:  Temp:  [97 F (36.1 C)-98.6 F (37 C)] 98.2 F (36.8 C) (02/17 0900) Pulse Rate:  [78-103] 101 (02/17 0900) Resp:  [0-27] 20 (02/17 0900) SpO2:  [94 %-98 %] 95 % (02/17 0900) Arterial Line BP: (91-122)/(65-97) 93/66 (02/17 0900) FiO2 (%):  [40 %-50 %] 40 % (02/17 0900) Weight:  [137.8 kg] 137.8 kg (02/17 0425)  Weight change: -0.2 kg Filed Weights   11/30/21 1809 12/02/21 0317 12/03/21 0425  Weight: 131.7 kg (!) 138 kg (!) 137.8 kg    Intake/Output: I/O last 3 completed shifts: In: 5389.8 [I.V.:3319.1; Other:450; NG/GT:720; IV Piggyback:900.7] Out: 6226 [Drains:100; JFHLK:5625]   Intake/Output this shift:  Total I/O In: 175.6 [I.V.:105.3; NG/GT:70.3] Out: 200 [Other:200]  Physical Exam: General: Laying in bed  Head: ET tube in place  Lungs:  Ventilator assisted,  Heart: Regular  Abdomen:  Soft, nontender, PD catheter, obese  Extremities:  B/l BKA  Neurologic: Sedated  Skin: Warm  Access: PD catheter  Right femoral temporary dialysis catheter 11/29/2021.  Dr. [name]  Basic Metabolic Panel: Recent Labs  Lab 11/28/21 0602 11/29/21 6389 11/30/21 3734 12/08/2021 2876 11/24/2021 0601 12/13/2021 1810 12/02/21 0407 12/02/21 1600  12/03/21 0359  NA 136 134* 135 132*  --  133* 133* 134* 134*  K 4.0 3.5 3.6 3.7  --  4.4 4.2 4.2 4.3  CL 93* 93* 95* 93*  --  91* 95* 97* 102  CO2 18* 15* 20* 18*  --  20* 23 24 24   GLUCOSE 108* 152* 128* 154*  --  148* 183* 178* 187*  BUN 67* 64* 56* 55*  --  53* 45* 37* 32*  CREATININE 11.50* 11.44* 10.89* 10.30*  --  10.56* 7.62* 5.59* 4.26*  CALCIUM 7.9* 7.8* 8.1* 8.1*  --  8.3* 8.5* 8.7* 8.5*  MG 1.5* 1.6* 2.0  --  1.8  --  2.2  --  2.4  PHOS 10.0* 9.3* 10.8*  --   --  5.8*  --  5.9*  --      Liver Function Tests: Recent Labs  Lab 11/29/21 0342 11/30/21 0314 12/02/2021 0338 12/07/2021 1810 12/02/21 0407 12/02/21 1600 12/03/21 0359  AST 12* 10* 20  --  36  --  30  ALT <5 5 6   --  8  --  11  ALKPHOS 132* 129* 135*  --  141*  --  131*  BILITOT 1.0 0.6 0.9  --  0.9  --  1.1  PROT 6.6 6.8 6.8  --  6.4*  --  6.3*  ALBUMIN 2.7* 2.7* 2.5* 2.1* 2.1* 2.1* 2.1*    No results for input(s): LIPASE, AMYLASE in the last 168 hours. No results for input(s): AMMONIA in the last 168 hours.  CBC: Recent Labs  Lab 11/19/2021 1320 11/27/2021 1412 11/27/21 0528 11/28/21 0602 11/29/21 0342 11/30/21 0430 12/05/2021 0338 12/10/2021 2135 12/02/21  0407 12/03/21 0359  WBC 17.2* 16.6* 14.4*   < > 23.3* 30.5* 36.5*  --  39.7* 34.3*  NEUTROABS 14.0* 14.0* 11.8*  --   --   --   --   --   --   --   HGB 9.2* 9.4* 8.6*   < > 8.5* 8.8* 9.2* 9.1* 9.0* 8.4*  HCT 28.4* 29.6* 27.4*   < > 27.5* 28.9* 30.1* 29.6* 28.4* 27.0*  MCV 99.6 98.7 100.4*   < > 100.0 101.8* 102.4*  --  98.3 100.4*  PLT 280 247 242   < > 274 267 253  --  231 228   < > = values in this interval not displayed.     Cardiac Enzymes: Recent Labs  Lab 12/05/2021 0601  CKTOTAL 913*     BNP: Invalid input(s): POCBNP  CBG: Recent Labs  Lab 12/02/21 1605 12/02/21 1929 12/02/21 2314 12/03/21 0313 12/03/21 0724  GLUCAP 181* 171* 160* 150* 165*     Microbiology: Results for orders placed or performed during the hospital  encounter of 12/05/2021  Resp Panel by RT-PCR (Flu A&B, Covid) Nasopharyngeal Swab     Status: Abnormal   Collection Time: 11/17/2021  2:12 PM   Specimen: Nasopharyngeal Swab; Nasopharyngeal(NP) swabs in vial transport medium  Result Value Ref Range Status   SARS Coronavirus 2 by RT PCR POSITIVE (A) NEGATIVE Final    Comment: (NOTE) SARS-CoV-2 target nucleic acids are DETECTED.  The SARS-CoV-2 RNA is generally detectable in upper respiratory specimens during the acute phase of infection. Positive results are indicative of the presence of the identified virus, but do not rule out bacterial infection or co-infection with other pathogens not detected by the test. Clinical correlation with patient history and other diagnostic information is necessary to determine patient infection status. The expected result is Negative.  Fact Sheet for Patients: EntrepreneurPulse.com.au  Fact Sheet for Healthcare Providers: IncredibleEmployment.be  This test is not yet approved or cleared by the [place] FDA and  has been authorized for detection and/or diagnosis of SARS-CoV-2 by FDA under an Emergency Use Authorization (EUA).  This EUA will remain in effect (meaning this test can be used) for the duration of  the COVID-19 declaration under Section 564(b)(1) of the A ct, 21 U.S.C. section 360bbb-3(b)(1), unless the authorization is terminated or revoked sooner.     Influenza A by PCR NEGATIVE NEGATIVE Final   Influenza B by PCR NEGATIVE NEGATIVE Final    Comment: (NOTE) The Xpert Xpress SARS-CoV-2/FLU/RSV plus assay is intended as an aid in the diagnosis of influenza from Nasopharyngeal swab specimens and should not be used as a sole basis for treatment. Nasal washings and aspirates are unacceptable for Xpert Xpress SARS-CoV-2/FLU/RSV testing.  Fact Sheet for Patients: EntrepreneurPulse.com.au  Fact Sheet for Healthcare  Providers: IncredibleEmployment.be  This test is not yet approved or cleared by the [place] FDA and has been authorized for detection and/or diagnosis of SARS-CoV-2 by FDA under an Emergency Use Authorization (EUA). This EUA will remain in effect (meaning this test can be used) for the duration of the COVID-19 declaration under Section 564(b)(1) of the Act, 21 U.S.C. section 360bbb-3(b)(1), unless the authorization is terminated or revoked.  Performed at [facility], [address] [place],  [address]   CULTURE, BLOOD (ROUTINE X 2) w Reflex to ID Panel     Status: None   Collection Time: 11/27/21 10:32 AM   Specimen: BLOOD  Result Value Ref Range Status  Specimen Description BLOOD BLOOD RIGHT HAND  Final   Special Requests   Final    BOTTLES DRAWN AEROBIC AND ANAEROBIC Blood Culture adequate volume   Culture   Final    NO GROWTH 5 DAYS Performed at [facility], [address] [place], [place] [address]    Report Status 12/02/2021 FINAL  Final  CULTURE, BLOOD (ROUTINE X 2) w Reflex to ID Panel     Status: None   Collection Time: 11/27/21 10:32 AM   Specimen: BLOOD  Result Value Ref Range Status   Specimen Description BLOOD BLOOD RIGHT ARM  Final   Special Requests   Final    BOTTLES DRAWN AEROBIC AND ANAEROBIC Blood Culture adequate volume   Culture   Final    NO GROWTH 5 DAYS Performed at [facility], [address] [place], [place] [address]    Report Status 12/02/2021 FINAL  Final  MRSA Next Gen by PCR, Nasal     Status: None   Collection Time: 11/27/21  4:24 PM   Specimen: Nasal Mucosa; Nasal Swab  Result Value Ref Range Status   MRSA by PCR Next Gen NOT DETECTED NOT DETECTED Final    Comment: (NOTE) The GeneXpert MRSA Assay (FDA approved for NASAL specimens only), is one component of a comprehensive MRSA colonization surveillance program. It is not intended to diagnose MRSA infection nor to guide or  monitor treatment for MRSA infections. Test performance is not FDA approved in patients less than 78 years old. Performed at [facility] [address] [place], [place] [address]   CULTURE, BLOOD (ROUTINE X 2) w Reflex to ID Panel     Status: None   Collection Time: 11/28/21 11:42 AM   Specimen: BLOOD  Result Value Ref Range Status   Specimen Description BLOOD BLOOD RIGHT HAND  Final   Special Requests   Final    BOTTLES DRAWN AEROBIC AND ANAEROBIC Blood Culture adequate volume   Culture   Final    NO GROWTH 5 DAYS Performed at [facility], [address] [place], [place] [address]    Report Status 12/03/2021 FINAL  Final  CULTURE, BLOOD (ROUTINE X 2) w Reflex to ID Panel     Status: None   Collection Time: 11/28/21 11:42 AM   Specimen: BLOOD  Result Value Ref Range Status   Specimen Description BLOOD RIGHT ANTECUBITAL  Final   Special Requests   Final    AEROBIC BOTTLE ONLY Blood Culture results may not be optimal due to an inadequate volume of blood received in culture bottles   Culture   Final    NO GROWTH 5 DAYS Performed at [facility], [address] [place], [place] [address]    Report Status 12/03/2021 FINAL  Final  Wet prep, genital     Status: Abnormal   Collection Time: 11/29/21  9:16 AM   Specimen: Vaginal  Result Value Ref Range Status   Yeast Wet Prep HPF POC NONE SEEN NONE SEEN Final   Trich, Wet Prep NONE SEEN NONE SEEN Final   Clue Cells Wet Prep HPF POC PRESENT (A) NONE SEEN Final   WBC, Wet Prep HPF POC >=10 (A) <10 Final   Sperm NONE SEEN  Final    Comment: Performed at [facility], [address] [place], [place] [address]  Culture, Respiratory w Gram Stain     Status: None (Preliminary result)   Collection Time: 11/28/2021  2:51 PM   Specimen:  Tracheal Aspirate; Respiratory  Result Value Ref Range Status   Specimen Description   Final    TRACHEAL ASPIRATE Performed at [facility], [address] [place], [place] [address]    Special Requests   Final    NONE Performed at [facility], [address] [place], [place] [address]    Gram Stain   Final    FEW WBC PRESENT, PREDOMINANTLY MONONUCLEAR NO ORGANISMS SEEN    Culture   Final    NO GROWTH 2 DAYS Performed at [facility], [place] [address], [place], [place] [address]    Report Status PENDING  Incomplete    Coagulation Studies: No results for input(s): LABPROT, INR in the last 72 hours.   Urinalysis: No results for input(s): COLORURINE, LABSPEC, PHURINE, GLUCOSEU, HGBUR, BILIRUBINUR, KETONESUR, PROTEINUR, UROBILINOGEN, NITRITE, LEUKOCYTESUR in the last 72 hours.  Invalid input(s): APPERANCEUR    Imaging: DG Chest 1 View  Result Date: 11/20/2021 CLINICAL DATA:  Central line placement EXAM: CHEST  1 VIEW COMPARISON:  12/14/2021 at 3:47 a.m. FINDINGS: Endotracheal tube tip is somewhat indistinct but thought to be about 2.4 cm above the carina. Nasogastric tube indistinct but appears to enter the stomach. A left internal jugular central venous catheter is present tip projecting over the SVC. Low lung volumes are present, causing crowding of the pulmonary vasculature. Stable indistinct airspace opacity at the lung bases, right greater than left. The patient is rotated to the left on today's radiograph, reducing diagnostic sensitivity and specificity. IMPRESSION: 1. Left central line tip projects over the SVC. No visible pneumothorax. 2. Stable bibasilar airspace opacities, right greater than left. Low lung volumes. Electronically Signed   By: Van Clines M.D.   On: 11/22/2021 13:11   CT HEAD WO CONTRAST (5MM)  Result Date: 11/30/2021 CLINICAL DATA:  Follow up stroke EXAM: CT HEAD WITHOUT CONTRAST TECHNIQUE: Contiguous axial images were obtained from the base of the skull through the vertex without intravenous contrast. RADIATION DOSE REDUCTION: This exam was performed according to the departmental  dose-optimization program which includes automated exposure control, adjustment of the mA and/or kV according to patient size and/or use of iterative reconstruction technique. COMPARISON:  Multiple exams, including 11/27/2021 and MRI brain from 11/22/2021 FINDINGS: Brain: Abnormal hypodensity and loss of gray-white differentiation throughout the right occipital lobe, a substantial portion of the right temporal lobe, and much of the right parietal lobe compatible with a large distribution infarct involving PCA and posterior MCA distribution on the right, substantially larger than the small perfusion defects seen on the right side on the MRI from 11/29/2021. There is also substantial vasogenic edema potentially some cytotoxic edema posteriorly in the left frontal lobe, substantially disproportionate to the small infarcts shown on the 12/11/2021 exam likewise suspicious for progressive infarcts. No midline shift. New patchy white matter hypodensities in both frontal lobes have substantially increased from 11/27/2021 and are concerning for potential white matter infarcts or associated vasogenic edema. This includes hypodensities along the internal capsules and periventricular white matter. Strictly speaking in the context of the patient's lower extremity ischemia, infection is not completely excluded although ischemia seems more likely given the other findings intracranially. No large amount of intracranial hemorrhage. No hydrocephalus or effacement of the basilar cisterns. Vascular: No hyperdense MCA. Widespread age advanced atherosclerotic vascular calcifications as can be encountered in the setting of diabetes. Skull: Right frontal osteoma noted. Patchy demineralization in the calvarium is unchanged. Sinuses/Orbits: Unremarkable Other: No supplemental non-categorized findings. IMPRESSION: 1.  Large subacute infarct involving the right temporal, parietal, and occipital lobes. Indistinct subacute infarct in the left  central MCA distribution, posteriorly in the left frontal lobe. Patchy new white matter hypodensities along the internal capsules and periventricular white matter concerning for additional ischemic lesions in this clinical context. Overall this represents a marked worsening/progression compared to prior images of 12/14/2021 and 11/27/2021. No current overt intracranial hematoma identified. No current hydrocephalus or effacement of the basilar cisterns. These results were called by telephone at the time of interpretation on 11/19/2021 at 4:38 pm to provider DANA NELSON , who verbally acknowledged these results. Electronically Signed   By: Van Clines M.D.   On: 11/30/2021 16:45     Medications:     prismasol BGK 4/2.5 400 mL/hr at 12/03/21 0741    prismasol BGK 4/2.5 400 mL/hr at 12/03/21 0741   sodium chloride 5 mL/hr at 12/03/21 0900   ceFEPime (MAXIPIME) IV 2 g (12/03/21 0914)   fentaNYL infusion INTRAVENOUS 50 mcg/hr (12/03/21 0900)   heparin Stopped (12/03/21 0920)   metronidazole Stopped (12/03/21 0004)   midazolam Stopped (12/02/2021 1800)   norepinephrine (LEVOPHED) Adult infusion 14 mcg/min (12/03/21 0900)   prismasol BGK 4/2.5 1,500 mL/hr at 12/03/21 0742   vancomycin Stopped (12/02/21 2000)   vasopressin 0.03 Units/min (12/03/21 0900)    vitamin C  500 mg Per Tube BID   chlorhexidine gluconate (MEDLINE KIT)  15 mL Mouth Rinse BID   Chlorhexidine Gluconate Cloth  6 each Topical Daily   collagenase   Topical Daily   docusate  100 mg Per Tube BID   feeding supplement (PROSource TF)  45 mL Per Tube TID   feeding supplement (VITAL AF 1.2 CAL)  1,000 mL Per Tube Q24H   free water  30 mL Per Tube Q4H   gentamicin cream  1 application Topical Daily   hydrocortisone sod succinate (SOLU-CORTEF) inj  100 mg Intravenous Q8H   insulin aspart  0-6 Units Subcutaneous Q4H   ipratropium-albuterol  3 mL Nebulization Q6H   lactulose  30 g Per Tube TID   mouth rinse  15 mL Mouth Rinse 10  times per day   multivitamin  1 tablet Per Tube QHS   pantoprazole (PROTONIX) IV  40 mg Intravenous Q24H   polyethylene glycol  17 g Per Tube Daily   sennosides  5 mL Per Tube BID   sodium chloride, acetaminophen **OR** acetaminophen (TYLENOL) oral liquid 160 mg/5 mL **OR** acetaminophen, albuterol, fentaNYL, fentaNYL (SUBLIMAZE) injection, heparin sodium (porcine), sodium chloride  Assessment/ Plan:  [name] is a 36 y.o. black female with end stage renal disease on peritoneal dialysis, hypertension, congestive heart failure, diabetes mellitus type II, peripheral vascular disease who is admitted to [facility] on 11/27/2021 for PVD (peripheral vascular disease) ([place]) [I73.9] Pain [R52] Dysarthria [R47.1] Acute CVA (cerebrovascular accident) ([place]) [I63.9] Cerebrovascular 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 ([place]) [I63.40] COVID-19 [U07.1]  COVID-19 + Patient was admitted as a code stroke. She was scheduled for debridement of her left foot wound later that day. Procedure was cancelled.   [facility] Peritoneal [facility]on Payor 132kg CCPD 9 hours 5 exchanges 3038m fills and last fill of 20026m End Stage Renal Disease: With hypotension Peritoneal dialysis on hold Undergoing CRRT now No changes today.  Continue 4K bath.  Fluid balance net even Requiring pressors for hypotension  Anemia of kidney disease:   Lab Results  Component Value Date   HGB 8.4 (L) 12/03/2021  Hold off on EPO due to ischemic event.   Secondary Hyperparathyroidism with hyperphosphatemia and calciphylaxis: Labs from 2/7: PTH 1882, calcium 8 and phos 8.1. was getting sodium thiosulfate as outpatient with peritoneal dialysis. - When patient is able to take PO, recommend restarting binders. Phosphorus level improving with hemo-dialysis  4. 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 Hypoglycemia on 2/10. Hemoglobin A1c of 5.1% on admission - monitor blood  sugars  5. Altered mental status: with concerns for acute ischemic stroke.   MRI shows multiple chronic vessel small ischemic changes and lacunar infarcts concerning for embolic disease.  2D echo from November 27, 2021-LVEF 55 to 60%, mild LVH, elevated left atrial pressure and severe dilatation, restricted anterior and posterior mitral valve leaflet motion, mitral valve heavily thickened and calcified, could represent endocarditis, severe mitral stenosis, severe mitral annular calcifications, moderate to severe tricuspid regurgitation -Currently on broad-spectrum antibiotics for possible mitral valve endocarditis -On heparin for embolic stroke  6. Peripheral vascular disease and left foot osteomyelitis causing sepsis: Antibiotics as per podiatry and vascular.  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 COVID-19 infection:  Respiratory isolation  Remdesivir completed. Supportive care  8.  Acute respiratory failure Intubated 11/29/2021 Continued on ventilator support Management as per ICU team     LOS: 7 Darlette Dubow 2/17/20239:48 AM

## 2021-12-03 NOTE — Consult Note (Signed)
[place] for heparin Indication:  Thrombus  Allergies  Allergen Reactions   Percocet [Oxycodone-Acetaminophen] Itching   Tomato Itching   Gabapentin Other (See Comments)    Extremity tremors    Patient Measurements: Height: 5\' 6"  (167.6 cm) Weight: (!) 137.8 kg (303 lb 12.7 oz) IBW/kg (Calculated) : 59.3 Heparin Dosing Weight: 92.3 Kg  Vital Signs: Temp: 97.9 F (36.6 C) (02/17 0100) Temp Source: Esophageal (02/16 1900) Pulse Rate: 78 (02/17 0100)  Labs: Recent Labs    11/17/2021 0338 11/20/2021 0532 11/17/2021 0601 12/07/2021 1810 11/30/2021 2135 12/02/21 0407 12/02/21 1600 12/02/21 2046 12/03/21 0359  HGB 9.2*  --   --   --  9.1* 9.0*  --   --  8.4*  HCT 30.1*  --   --   --  29.6* 28.4*  --   --  27.0*  PLT 253  --   --   --   --  231  --   --  228  HEPARINUNFRC  --   --  0.24*  --   --   --   --  0.42 0.48  CREATININE 10.30*  --   --    < >  --  7.62* 5.59*  --  4.26*  CKTOTAL  --   --  913*  --   --   --   --   --   --   TROPONINIHS 269* 269*  --   --   --   --   --   --   --    < > = values in this interval not displayed.     Estimated Creatinine Clearance: 26.1 mL/min (A) (by C-G formula based on SCr of 4.26 mg/dL (H)).   Medical History: Past Medical History:  Diagnosis Date   Anemia of chronic disease    Asthma    well controlled-no inhaler   Calciphylaxis 2020   CHF (congestive heart failure) ([place])    CKD (chronic kidney disease), stage III ([place])    Diabetes ([place])    type 2-diet controlled   Dyspnea    Hypertension    Morbid obesity ([place])    Nephrotic syndrome    PAD (peripheral artery disease) ([place])    Sepsis ([place]) 06/2021    Medications:  No chronic anticoagulation PTA Heparin Dosing Weight: 92.3 Kg  Assessment: Pharmacy has been consulted to initiate and monitor heparin in 36yo patient admitted with stroke-like symptoms. Her symptoms began to resolve, but neurologist noted concern for lower extremity  ischemia, likely embolic. H&H, platelets have been stable. 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  Goal of Therapy:  Heparin level 0.3-0.5 units/ml (Per Stroke Protocol) Monitor platelets by anticoagulation protocol: Yes  Date Time HL  Comment/rate 2/10 2222 HL < 0.10, subtherapeutic 2/11 0906 HL 0.12, subtherapeutic 2/11 2048 HL 0.22, subtherapeutic  2/12 0602 HL 0.44, therapeutic x 1 2/12 1500 HL 0.29, subtherapeutic; 2100>2200 un/hr 2/12 2356 HL 0.36, therapeutic x 1; 2200 un/hr 2/13 0916 HL 0.40, therapeutic x2; 2200 un/hr 2/14 0314 HL 0.51: 2200 >> 2100 units/hr 2/14 1339 HL 0.32: 2100 units/hr 2/14 2226 HL 0.26: 2100 >>2200 units/hr 2/15 0601 HL 0.24  2200 >>2300 units/hr 2/16 2046 HL 0.42 Therapeutic x 1 @ 2300 units/hr 2/17 0359 HL 0.48 Therapeutic X 2 @ 2300 units/hr   Plan:  2/17:  HL @ 0359 = 0.48 , therapeutic X 2  Will continue pt on current  rate and recheck HL on 2/18 with AM labs.   Orene Desanctis, PharmD Clinical Pharmacist   12/03/2021 5:50 AM

## 2021-12-03 NOTE — Progress Notes (Signed)
Pt transported to CT and back to CCU without any issues.

## 2021-12-03 NOTE — Progress Notes (Signed)
NAME:  [name], MRN:  [id number], DOB:  [birth year], LOS: 7 ADMISSION DATE:  12/05/2021, CONSULTATION DATE:  11/27/21 REFERRING MD:  Dr. Loleta Books, CHIEF COMPLAINT:  CODE STROKE   History of Present Illness:  36 yo F presenting to [facility] ED with complaints of difficulty speaking & left sided hemiparesis around 11:30 AM on 11/18/2021.  (All information gathered from chart review and attending/EDP documentation as patient is unable to participate in interview and there is no family at bedside.) Patient was originally scheduled for a vascular procedure on 11/18/2021 and prior to arriving she had taken antihypertensives at which point her mother states the patient became confused and had difficulty speaking as well as difficulty moving her left side.   ED course: CODE STROKE initiated patient evaluated by neurology.  Patient speech had improved as well as left-sided weakness, however she remains encephalopathic. MRI showed acute and subacute CVA in multiple vascular territories and the patient was admitted by [facility] for further stroke work-up.  It was also noted on admission the patient was pulseless in the right foot for which vascular surgery was consulted. medications given: Patient was started on heparin drip per vascular surgery recommendation of which neurology is aware. Initial Vitals: Afebrile at 98.8, mildly tachypneic at 21, NSR at 88, BP soft 85/74 > 95/74 with SPO2 88% on 2 L nasal cannula. Significant labs: (Labs/ Imaging personally reviewed) I, Domingo Pulse [place], AGACNP-BC, personally viewed and interpreted this ECG. EKG Interpretation: Date: 12/03/2021, EKG Time: 14:37, Rate: 94, Rhythm: NSR, QRS Axis: Normal, Intervals: Prolonged QTc, ST/T Wave abnormalities: None, Narrative Interpretation: NSR with nonspecific intraventricular block and baseline wander Chemistry: Cl: 94, BUN/Cr.:  65/11.34, Serum CO2/ AG: 20/26, alk phos: 131, albumin: 2.8, CRP: 5.9 Hematology: WBC: 16.6, Hgb: 9.4,   COVID-19 & Influenza A/B: COVID-positive CXR 11/17/2021: Cardiomegaly with mild pulmonary congestion X-ray left foot 2 views 12/10/2021: postsurgical changes of transmetatarsal amputation of the first through fifth rays. new bony irregularity along the resection margins of the third, fourth and fifth metatarsals concerning for osteomyelitis MRI brain without contrast 11/22/2021: Subcentimeter acute cortically based infarcts with the lateral right frontal lobe and posterior left frontal lobe. patchy small acute infarcts within the callosal splenium on the right additional small recent infarcts (measuring up to 12 mm) are questioned within the left frontoparietal white matter given the small infarcts involving multiple vascular territories there is a suspicion for embolic process. small chronic cortical and subcortical infarct within the right parietoccipital lobes.  Chronic hemorrhagic lacunar infarct within the left caudate nucleus.  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  Mild mucosal thickening within the bilateral ethmoid sinuses.  Trapped fluid within the left petrous apex.  Hospital course: [facility] consulted for admission and neurology consulted for acute /subacute multiple infarcts.  Nephrology consulted due to peritoneal dialysis requirements.  Vascular surgery consulted for assistance with right lower extremity ischemia.  Patient was started on heparin drip with additional imaging and work-up for acute and subacute CVA.  Antibiotics started for concerns of osteomyelitis in the foot.  Echocardiogram 11/27/2021: LVEF 55 to 60%, no regional wall motion abnormalities, mild concentric LVH, elevated LA pressure, RV systolic function is normal, LA size severely dilated. Restricted anterior and posterior mitral valve leaflet motion compared with the echo in 2017 with concern for endocarditis. Moderate MR, Severe MS, moderate-severe TR, no AS.   11/27/21: Early in the day neurology  documented the patient has aphasic but inact in terms of following  commands, with improved left sided hemiparesis and new right arm drift. Per TRH documentation, patient received Dilaudid for severe pain in ischemic LE, then waking up from a nap more confused with more Right sided hemiparesis. STAT CTH ordered. While in CT patient became acutely hypoxic when laid flat, desaturations in the 70's. Rapid response called and patient transferred to SDU for monitoring. CT head wo contrast 11/27/21: No new intracranial abnormalities  PCCM consulted for additional management and monitoring while in SDU.  Pertinent  Medical History  PAD Anemia of chronic disease T2DM Asthma ESRD on PD HFpEF HTN Obesity calciphylaxis  Significant Hospital Events: Including procedures, antibiotic start and stop dates in addition to other pertinent events   12/02/2021: Admit to inpatient as CODE STROKE with multiple  acute/subacute infarcts on MRI, ESRD on PD, acute RLE ischemia & suspected osteomyelitis on L foot with Neurology, Nephrology & Vascular surgery following 11/27/21: Echo concerning for endocarditis, new confusion/right sided hemiparesis-STAT CT ordered, but when patient laid flat became hypoxic- rapid response to SDU with PCCM consult. 11/28/21: US Venous Bilateral revealed No evidence of deep venous thrombosis in either lower extremity to the level of the knees. Calf veins are not well seen due to patient body habitus and subcutaneous edema. 11/28/21: possible endocarditis.  She also has severe comorbid conditions that require multiple specialists involved. Together the entire medical team reviewed plan and agreement to transfer to [facility] cone for additional evaluation and management. reviewed medical plan with family including mother Jaya Lapka.  11/29/21: remains encephalopathic, increased WOB, emergently intubated, family updated 2/14 remains on vent, pressors, per Nephrology PD overnight 12/13/2021: Pt  with worsening hypotension overnight requiring levophed/neo-synephrine/vasopressin gtts.  Also, vent dyssynchrony overnight no amenable to vent changes or increased sedation requiring vecuronium x1 dose. CRRT started.  pupils were unreactive and CT head showed new multifocal areas of stroke some of which are large territory. 2/16:  Continues on CRRT.  Continues to require Levophed and Vasopressin. 2/17: Worsened Neuro exam (no cough/gag, pupils 5 mm and sluggish), STAT repeat CTH shows evolving right sided infarcts and left MCA infarct, new infarct of the Right cerebellar hemisphere in SCA distribution, increasing mass effect with new 9 mm leftward midline shift. Neurology following, given Hypertonic saline bolus and infusion.  Plan for family meeting  Cultures:  Respiratory Panel by RT-PCR 02/10>>positive for COVID; negative influenza A/B/ MRSA PCR 02/11>>negative  Blood x2 02/11>>negative Blood x2 02/12>>negative  Genital Wet Prep 02/13>>clue cells present and wbc >=10  Interim History / Subjective:  -No significant events noted overnight -This morning's Neuro exam is significantly worse (no cough/gag reflexes, pupils sluggish and dilated at 5 mm bilaterally) ~ STAT CT Head is pending ~ have updated Neurology of change, holding Heparin until CT resulted, may need Hypertonic saline or Mannitol -CT head shows evolving right sided infarcts and left MCA infarct, new infarct of the Right cerebellar hemisphere in SCA distribution, increasing mass effect with new 9 mm leftward midline shift -Neurology following, ordered Hypertonic saline bolus and infusion -Afebrile, continues to require Vasopressors (Levophed 9 mcg, Vasopressin at shock dose) -Continues on CRRT -Leukocytosis slowly improving to 34.3 (39.7)  Objective   Blood pressure (!) 69/39, pulse (!) 102, temperature 98.1 F (36.7 C), temperature source Esophageal, resp. rate 20, height 5' 6"  (1.676 m), weight (!) 137.8 kg, SpO2 95 %.    Vent  Mode: PRVC FiO2 (%):  [40 %-50 %] 40 % Set Rate:  [20 bmp] 20 bmp Vt Set:  [400 mL] 400  mL PEEP:  [5 cmH20] 5 cmH20 Plateau Pressure:  [24 cmH20] 24 cmH20   Intake/Output Summary (Last 24 hours) at 12/03/2021 0911 Last data filed at 12/03/2021 0800 Gross per 24 hour  Intake 2579.12 ml  Output 2499 ml  Net 80.12 ml    Filed Weights   11/30/21 1809 12/02/21 0317 12/03/21 0425  Weight: 131.7 kg (!) 138 kg (!) 137.8 kg    Examination: General: Critically ill appearing female, sedated/mechanically intubated NAD  HEENT: Supple, no JVD, moist mucous membranes  Neuro: sedated on low dose fentanyl, not following commands, NO COUGH/GAG REFLEXES, pupils dilated and sluggish at 5 mm bilaterally CV: Tachycardia, regular rhythm, S1S2, no M/R/G, trace bilateral lower extremity edema, 1+ via doppler right radial/bilateral popliteal pulses, 1+ palpable left radial pulse Pulm: Diminished breath sounds bilaterally, occasionally overbreathes the vent, even, non labored  GI: soft, rounded, non tender, non distended, bs x 4 Extremities: Bilateral BKA  Resolved Hospital Problem list     Assessment & Plan:   Acute Metabolic Encephalopathy Multiple Acute/subacute infarcts most likely embolic ~ progressing now with new mass effect with 9 mm leftward shift Sedation needs in setting of mechanical ventilation PMHx: HLD, HTN -Maintain a RASS goal of 0 to -1 -Fentanyl gtt as needed to maintain RASS goal -Avoid sedating medications as able -Daily wake up assessment -Neurology following, appreciate input ~ will follow recommendations -TEE once stable -SBP goal 100-140 -continue heparin stroke protocol ~ discussed with Neurology, may continue Heparin -consider CTA Head and Neck once stable -CT Head 2/17: IMPRESSION: 1. Ongoing evolution of extensive ischemia in the right cerebral hemisphere with increased indistinctness of the right basal ganglia, and worsened ischemia in the right superior frontal  lobe. There is relative sparing of the anterior frontal lobe. 2. Additional evolving infarcts in the left MCA distribution. 3. New infarct in the right cerebellar hemisphere in the SCA distribution. 4. Mass effect from the right-sided infarcts has increased, with new effacement of the right lateral ventricle and basal cisterns, and new 9 mm leftward midline shift. 5. No evidence of acute intracranial hemorrhage. -Discussed with Neurology ~ given 250 ml 3% saline bolus followed by 3% Hypertonic saline infusion at 75 cc/hr -Follow Hypertonic Saline protocol for Cerebral Edema/Impending Herniation -Follow serum Na q6h -Goal Serum Na 150-155 (hold hypertonic saline if serum Na >160)  Septic Shock ? Mitral Valve Endocarditis Mitral Stenosis -Continuous cardiac monitoring -Maintain MAP >65 -Vasopressors as needed to maintain MAP goal -Stress dose steroids -Trend lactic acid until normalized (2.9 ~ 2.1) -Trend HS Troponin until peaked (269 ~ 269) -Echocardiogram 11/27/21: LVEF 55-60%, indeterminate diastolic parameters, RV systolic function normal, Mitral valve is heavily thickened and calcified, could represent endocarditis, moderate mitral valve regurgitation, severe mitral stenosis, moderate to severe tricuspid valve regurgitation - Cardiology following,  appreciate input~ possible TEE once stabilized   Severe Sepsis due to possible osteomyelitis of the Left TMA, possible mitral valve endocarditis, and ischemic right foot  Mitral stenosis  -Monitor fever curve -Trend WBC's & Procalcitonin -Follow cultures as above -Continue empiric Cefepime, Flagyl, & Vancomycin pending cultures & sensitivities -Vascular Surgery following, appreciate input ~ 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 -Continue Heparin gtt (ok to continue per discussion with Neuro 2/17) -Cardiology following, appreciate input ~ TEE once stabilized  Type 2 Diabetes Mellitus Controlled, HgbA1c: 5.1  - Q 4 CBG  monitoring - SSI  - Target CBG range 140-180 - Follow ICU hypo/hyper-glycemia protocol  Acute Hypoxic Respiratory Failure in the setting of COVID-19  infection & multiple metabolic derangements, and metabolic encephalopathy - Mechanical ventilation via ARDS protocol, target PRVC 6 cc/kg - Wean PEEP and FiO2 as able to maintain O2 sats >88% - Goal plateau pressure less than 30, driving pressure less than 15 - Paralytics if necessary for vent synchrony, gas exchange - Cycle prone positioning if necessary for oxygenation - Deep sedation per PAD protocol, goal RASS -3 to -4 for now, currently fentanyl, midazolam - Diuresis as blood pressure and renal function can tolerate, goal CVP 5-8.   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  Have requested her to come to bedside urgently for update.  Dr. Merrilee Jansky, Neurology, and myself will be present for family meeting.  Pt is critically ill with multiorgan failure, progressive strokes with new midline shift.  Concern for impending herniation and progression to brain death.  Prognosis is extremely guarded, high risk for cardiac arrest and death.  Long term prognosis is dismal.  Pt is DNR/DNI.  Recommend transition to comfort measures.  Best  Practice (right click and "Reselect all SmartList Selections" daily)  Diet/type: NPO; trickle feeds  DVT prophylaxis: systemic heparin GI prophylaxis: PPI Lines: Right femoral trialysis catheter; peritoneal dialysis catheter  Foley:  N/A Code Status:  DNR Last date of multidisciplinary goals of care discussion [12/03/2021]    Labs   CBC: Recent Labs  Lab 12/02/2021 1320 11/30/2021 1412 11/27/21 0528 11/28/21 0602 11/29/21 0342 11/30/21 0430 11/19/2021 0338 12/07/2021 2135 12/02/21 0407 12/03/21 0359  WBC 17.2* 16.6* 14.4*   < > 23.3* 30.5* 36.5*  --  39.7* 34.3*  NEUTROABS 14.0* 14.0* 11.8*  --   --   --   --   --   --   --   HGB 9.2* 9.4* 8.6*   < > 8.5* 8.8* 9.2* 9.1* 9.0* 8.4*  HCT 28.4* 29.6* 27.4*   < > 27.5* 28.9* 30.1* 29.6* 28.4* 27.0*  MCV 99.6 98.7 100.4*   < > 100.0 101.8* 102.4*  --  98.3 100.4*  PLT 280 247 242   < > 274 267 253  --  231 228   < > = values in this interval not displayed.     Basic Metabolic Panel: Recent Labs  Lab 11/28/21 0602 11/29/21 0342 11/30/21 0314 11/28/2021 0338 11/23/2021 0601 11/23/2021 1810 12/02/21 0407 12/02/21 1600 12/03/21 0359  NA 136 134* 135 132*  --  133* 133* 134* 134*  K 4.0 3.5 3.6 3.7  --  4.4 4.2 4.2 4.3  CL 93* 93* 95* 93*  --  91* 95* 97* 102  CO2 18* 15* 20* 18*  --  20* 23 24 24   GLUCOSE 108* 152* 128* 154*  --  148* 183* 178* 187*  BUN 67* 64* 56* 55*  --  53* 45* 37* 32*  CREATININE 11.50* 11.44* 10.89* 10.30*  --  10.56* 7.62*  5.59* 4.26*  CALCIUM 7.9* 7.8* 8.1* 8.1*  --  8.3* 8.5* 8.7* 8.5*  MG 1.5* 1.6* 2.0  --  1.8  --  2.2  --  2.4  PHOS 10.0* 9.3* 10.8*  --   --  5.8*  --  5.9*  --     GFR: Estimated Creatinine Clearance: 26.1 mL/min (A) (by C-G formula based on SCr of 4.26 mg/dL (H)). Recent Labs  Lab 11/30/21 0430 12/08/2021 0338 12/09/2021 0339 11/25/2021 0601 12/02/21 0407 12/03/21 0359  WBC 30.5* 36.5*  --   --  39.7* 34.3*  LATICACIDVEN  --   --  2.9* 2.1*  --   --      Liver Function  Tests: Recent Labs  Lab 11/29/21 0342 11/30/21 0314 12/08/2021 0338 11/19/2021 1810 12/02/21 0407 12/02/21 1600 12/03/21 0359  AST 12* 10* 20  --  36  --  30  ALT <5 5 6   --  8  --  11  ALKPHOS 132* 129* 135*  --  141*  --  131*  BILITOT 1.0 0.6 0.9  --  0.9  --  1.1  PROT 6.6 6.8 6.8  --  6.4*  --  6.3*  ALBUMIN 2.7* 2.7* 2.5* 2.1* 2.1* 2.1* 2.1*    No results for input(s): LIPASE, AMYLASE in the last 168 hours. No results for input(s): AMMONIA in the last 168 hours.  ABG    Component Value Date/Time   PHART 7.22 (L) 11/24/2021 0406   PCO2ART 49 (H) 11/27/2021 0406   PO2ART 113 (H) 12/05/2021 0406   HCO3 21.4 11/17/2021 1932   TCO2 26 09/08/2021 1418   ACIDBASEDEF 8.5 (H) 12/08/2021 1932   O2SAT 73.1 11/28/2021 1932      Coagulation Profile: Recent Labs  Lab 12/12/2021 2222  INR 1.3*     Cardiac Enzymes: Recent Labs  Lab 11/29/2021 0601  CKTOTAL 913*     HbA1C: Hgb A1c MFr Bld  Date/Time Value Ref Range Status  11/23/2021 06:30 PM 5.1 4.8 - 5.6 % Final    Comment:    (NOTE) Pre diabetes:          5.7%-6.4%  Diabetes:              >6.4%  Glycemic control for   <7.0% adults with diabetes   11/24/2021 10:05 AM 5.0 4.8 - 5.6 % Final    Comment:    (NOTE) Pre diabetes:          5.7%-6.4%  Diabetes:              >6.4%  Glycemic control for   <7.0% adults with diabetes     CBG: Recent Labs  Lab 12/02/21 1605 12/02/21 1929 12/02/21 2314 12/03/21 0313 12/03/21 0724  GLUCAP 181* 171* 160* 150* 165*     Review of Systems:   Unable to assess pt mechanically intubated, unresponsive.   Past Medical History:  She,  has a past medical history of Anemia of chronic disease, Asthma, Calciphylaxis (2020), CHF (congestive heart failure) ([place]), CKD (chronic kidney disease), stage III ([place]), Diabetes ([place]), Dyspnea, Hypertension, Morbid obesity ([place]), Nephrotic syndrome, PAD (peripheral artery disease) ([place]), and Sepsis ([place]) (06/2021).   Surgical  History:   Past Surgical History:  Procedure Laterality Date   A/V FISTULAGRAM Left 10/29/2020   Procedure: A/V FISTULAGRAM;  Surgeon: Algernon Huxley, MD;  Location: [place] [facility];  Service: Cardiovascular;  Laterality: Left;   AMPUTATION Bilateral 11/20/2021   Procedure: AMPUTATION BELOW KNEE-Guillotine;  Surgeon: Algernon Huxley, MD;  Location: [facility];  Service: Vascular;  Laterality: Bilateral;   APPLICATION OF WOUND VAC Bilateral 09/15/2019   Procedure: APPLICATION OF WOUND VAC;  Surgeon: Benjamine Sprague, DO;  Location: [facility];  Service: General;  Laterality: Bilateral;   AV FISTULA PLACEMENT Left 07/09/2020   Procedure: ARTERIOVENOUS (AV) FISTULA CREATION ( BRACHIAL CEPHALIC);  Surgeon: Algernon Huxley, MD;  Location: [facility];  Service: Vascular;  Laterality: Left;   DIALYSIS/PERMA CATHETER INSERTION N/A 02/28/2019   Procedure: DIALYSIS/PERMA CATHETER INSERTION;  Surgeon: Algernon Huxley, MD;  Location: [place] [facility];  Service: Cardiovascular;  Laterality: N/A;   DIALYSIS/PERMA CATHETER INSERTION N/A 09/17/2019   Procedure: DIALYSIS/PERMA CATHETER EXCHANGE;  Surgeon: Katha Cabal, MD;  Location: [place] [facility];  Service: Cardiovascular;  Laterality: N/A;   DIALYSIS/PERMA CATHETER REMOVAL N/A 03/18/2020   Procedure: DIALYSIS/PERMA CATHETER REMOVAL;  Surgeon: Algernon Huxley, MD;  Location: [place] [facility];  Service: Cardiovascular;  Laterality: N/A;   LIGATION OF ARTERIOVENOUS  FISTULA Left 06/03/2021   Procedure: LIGATION OF ARTERIOVENOUS  FISTULA;  Surgeon: Algernon Huxley, MD;  Location: [facility];  Service: Vascular;  Laterality: Left;   LOWER EXTREMITY ANGIOGRAPHY Left 12/02/2020   Procedure: LOWER EXTREMITY ANGIOGRAPHY;  Surgeon: Algernon Huxley, MD;  Location: [place] [facility];  Service: Cardiovascular;  Laterality: Left;   LOWER EXTREMITY ANGIOGRAPHY Right 01/25/2021   Procedure: LOWER EXTREMITY ANGIOGRAPHY;  Surgeon: Algernon Huxley, MD;  Location: [place] [facility];   Service: Cardiovascular;  Laterality: Right;   LOWER EXTREMITY ANGIOGRAPHY Left 04/15/2021   Procedure: LOWER EXTREMITY ANGIOGRAPHY;  Surgeon: Algernon Huxley, MD;  Location: [place] [facility];  Service: Cardiovascular;  Laterality: Left;   LOWER EXTREMITY ANGIOGRAPHY Right 05/20/2021   Procedure: LOWER EXTREMITY ANGIOGRAPHY;  Surgeon: Algernon Huxley, MD;  Location: [place] [facility];  Service: Cardiovascular;  Laterality: Right;   LOWER EXTREMITY ANGIOGRAPHY Left 05/27/2021   Procedure: LOWER EXTREMITY ANGIOGRAPHY;  Surgeon: Algernon Huxley, MD;  Location: [place] [facility];  Service: Cardiovascular;  Laterality: Left;   LOWER EXTREMITY ANGIOGRAPHY Right 08/04/2021   Procedure: LOWER EXTREMITY ANGIOGRAPHY;  Surgeon: Algernon Huxley, MD;  Location: [place] [facility];  Service: Cardiovascular;  Laterality: Right;   LOWER EXTREMITY ANGIOGRAPHY Left 11/15/2021   Procedure: LOWER EXTREMITY ANGIOGRAPHY;  Surgeon: Algernon Huxley, MD;  Location: [place] [facility];  Service: Cardiovascular;  Laterality: Left;   pd cath  05/21/2019   TENDON LENGTHENING Left 09/08/2021   Procedure: TENDON ACHILLES LENGTHENING;  Surgeon: Criselda Peaches, DPM;  Location: [facility];  Service: Podiatry;  Laterality: Left;   TONSILLECTOMY     TRANSMETATARSAL AMPUTATION Left 09/08/2021   Procedure: TRANSMETATARSAL AMPUTATION;  Surgeon: Criselda Peaches, DPM;  Location: [facility];  Service: Podiatry;  Laterality: Left;   UPPER EXTREMITY ANGIOGRAPHY Left 04/15/2021   Procedure: UPPER EXTREMITY ANGIOGRAPHY;  Surgeon: Algernon Huxley, MD;  Location: [place] [facility];  Service: Cardiovascular;  Laterality: Left;   WOUND DEBRIDEMENT Bilateral 09/15/2019   Procedure: DEBRIDEMENT WOUND;  Surgeon: Benjamine Sprague, DO;  Location: [facility];  Service: General;  Laterality: Bilateral;     Social History:   reports that she has quit smoking. Her smoking use included cigars. She has never used smokeless tobacco. She reports that she does not  drink alcohol and does not use drugs.   Family History:  Her family history includes CAD in her mother; Cancer in her  maternal grandfather and maternal grandmother; Hypertension in her mother; Kidney disease in her father; Multiple sclerosis in her mother.   Allergies Allergies  Allergen Reactions   Percocet [Oxycodone-Acetaminophen] Itching   Tomato Itching   Gabapentin Other (See Comments)    Extremity tremors     Home Medications  Prior to Admission medications   Medication Sig Start Date End Date Taking? Authorizing Provider  amLODipine (NORVASC) 10 MG tablet Take 10 mg by mouth daily as needed (elevated blood pressure).   Yes [provider]  aspirin EC 81 MG EC tablet Take 1 tablet (81 mg total) by mouth daily. 02/16/16  Yes Demetrios Loll, MD  atorvastatin (LIPITOR) 20 MG tablet Take 1 tablet (20 mg total) by mouth daily. 11/15/21  Yes Dew, Erskine Squibb, MD  carvedilol (COREG) 25 MG tablet Take 25 mg by mouth 2 (two) times daily as needed (elevated blood pressure).   Yes [provider]  clopidogrel (PLAVIX) 75 MG tablet Take 1 tablet (75 mg total) by mouth daily. 11/15/21  Yes Dew, Erskine Squibb, MD  isosorbide mononitrate (IMDUR) 30 MG 24 hr tablet Take 30 mg by mouth 2 (two) times daily as needed (elevated blood pressure).   Yes [provider]  losartan (COZAAR) 100 MG tablet Take 100 mg by mouth daily as needed (elevated blood pressure).   Yes [provider]  cilostazol (PLETAL) 100 MG tablet Take 1 tablet (100 mg total) by mouth 2 (two) times daily before a meal. Patient not taking: Reported on 12/14/2021 05/07/21   Kris Hartmann, NP  lactulose (CHRONULAC) 10 GM/15ML solution Take 30 mLs (20 g total) by mouth 2 (two) times daily as needed for mild constipation. Patient not taking: Reported on 11/22/2021 07/09/21   Edwin Dada, MD  multivitamin (RENA-VIT) TABS tablet Take 1 tablet by mouth at bedtime. Patient not taking: Reported on 11/15/2021  09/18/19   Fritzi Mandes, MD  ondansetron (ZOFRAN) 4 MG tablet Take 1 tablet (4 mg total) by mouth every 6 (six) hours as needed for nausea. Patient not taking: Reported on 11/09/2021 09/08/21   Criselda Peaches, DPM     Critical care time: 45 minutes    Darel Hong, AGACNP-BC [facility] Prefer epic messenger for cross cover needs If after hours, please call E-link

## 2021-12-03 NOTE — Progress Notes (Signed)
Nutrition Follow-up  DOCUMENTATION CODES:   Morbid obesity  INTERVENTION:   -Trickle feeds of Vital 1.5 @ 20 ml/hr  45 ml Prosource TF TID  Regimen provides 840 kcals, 65 grams protein, and 367 ml free water daily   Pt at high refeed risk; recommend monitor potassium, magnesium and phosphorus labs daily until stable   Rena-vit daily via tube    Vitamin C 500mg  BID via tube    Check Vitamin D level   NUTRITION DIAGNOSIS:   Inadequate oral intake related to inability to eat (pt sedated and ventilated) as evidenced by NPO status.  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 Pt currently down in CT scan. Pt has had a significant change in her neurological status. Pt with multiorgan failure with poor prognosis. Plan for goals of care meeting with family.   Pt continues on CRRT (on PD PTA).   Per RN, pt with 700 ml residual from feedings. RD will transition to more nutrient dense formula. Plan to continue trickle feeds at this time.   Medications reviewed and include miralax, senokot, versed, levophed, pitressin  Labs reviewed: Na: 134, CBGS: 150-188 (inpatient orders for glycemic control are 0-6 units  insulin aspart every 4 hours).     Diet Order:   Diet Order     None       EDUCATION NEEDS:   No education needs have been identified at this time  Skin:  Skin Assessment: Reviewed RN Assessment (closed incision L toe)  Last BM:  12/02/21  Height:   Ht Readings from Last 1 Encounters:  11/27/21 5\' 6"  (1.676 m)    Weight:   Wt Readings from Last 1 Encounters:  12/03/21 (!) 137.8 kg    Ideal Body Weight:  59 kg  BMI:  Body mass index is 49.03 kg/m.  Estimated Nutritional Needs:   Kcal:  1760kcal/day  Protein:  120-150g/day  Fluid:  UOP +1L    Loistine Chance, RD, LDN, [place] Registered Dietitian II Certified Diabetes Care and Education Specialist Please refer to [facility] for RD and/or RD on-call/weekend/after hours pager

## 2021-12-03 NOTE — Progress Notes (Signed)
Neurology Progress Note   S:// Yesterday pupils were unreactive and CT head showed new multifocal areas of stroke some of which are large territory.  O:// Current vital signs: BP (!) 69/39    Pulse (!) 101    Temp 98.2 F (36.8 C) (Esophageal)    Resp 20    Ht _0  (1.676 m)    Wt (!) 137.8 kg    SpO2 95%    BMI 49.03 kg/m  Vital signs in last 24 hours: Temp:  [97 F (36.1 C)-98.6 F (37 C)] 98.2 F (36.8 C) (02/17 0900) Pulse Rate:  [78-103] 101 (02/17 0900) Resp:  [0-27] 20 (02/17 0900) SpO2:  [94 %-98 %] 95 % (02/17 0900) Arterial Line BP: (91-122)/(65-97) 93/66 (02/17 0900) FiO2 (%):  [40 %-50 %] 40 % (02/17 0900) Weight:  [137.8 kg] 137.8 kg (02/17 0425)  General: Sick appearing obese woman, appears older than stated age, laying in bed, intubated. HEENT: Normocephalic, atraumatic CVS: Regular rhythm Abdomen: Obese without tenderness Extremities: Edematous, right calf ulcer looks healed, both feet bandaged. Respiratory: bilaterally equal excursions, no audible wheeze/cuff leak. Comatose on sedation, nonverbal. Eyes open. No oculocephalic reflexes. Pupils ~3m fixed unreactive. No spontaneous motor movent. No withdrawal to noxious stimulus DTRs mute Toes mute.  Medications  Current Facility-Administered Medications:     prismasol BGK 4/2.5 infusion, , CRRT, Continuous, SMurlean Iba MD, Last Rate: 400 mL/hr at 12/03/21 0741, New Bag at 12/03/21 0741    prismasol BGK 4/2.5 infusion, , CRRT, Continuous, SMurlean Iba MD, Last Rate: 400 mL/hr at 12/03/21 0741, New Bag at 12/03/21 0741   0.9 %  sodium chloride infusion, , Intravenous, PRN, DAlgernon Huxley MD, Last Rate: 5 mL/hr at 12/03/21 0900, Infusion Verify at 12/03/21 0900   acetaminophen (TYLENOL) tablet 650 mg, 650 mg, Oral, Q4H PRN **OR** acetaminophen (TYLENOL) 160 MG/5ML solution 650 mg, 650 mg, Per Tube, Q4H PRN, 650 mg at 11/27/21 0123 **OR** acetaminophen (TYLENOL) suppository 650 mg, 650 mg, Rectal, Q4H  PRN, DAlgernon Huxley MD, 650 mg at 11/27/21 0827   albuterol (PROVENTIL) (2.5 MG/3ML) 0.083% nebulizer solution 2.5 mg, 2.5 mg, Nebulization, Q4H PRN, BNelle Don MD   ascorbic acid (VITAMIN C) tablet 500 mg, 500 mg, Per Tube, BID, DAlgernon Huxley MD, 500 mg at 12/03/21 0915   ceFEPIme (MAXIPIME) 2 g in sodium chloride 0.9 % 100 mL IVPB, 2 g, Intravenous, Q12H, CBenita Gutter RPH, Last Rate: 200 mL/hr at 12/03/21 0914, 2 g at 12/03/21 0914   chlorhexidine gluconate (MEDLINE KIT) (PERIDEX) 0.12 % solution 15 mL, 15 mL, Mouth Rinse, BID, Dew, JErskine Squibb MD, 15 mL at 12/03/21 06010  Chlorhexidine Gluconate Cloth 2 % PADS 6 each, 6 each, Topical, Daily, Dew, JErskine Squibb MD, 6 each at 12/02/2021 1044   collagenase (SANTYL) ointment, , Topical, Daily, Dew, JErskine Squibb MD, Given at 12/06/2021 0432   docusate (COLACE) 50 MG/5ML liquid 100 mg, 100 mg, Per Tube, BID, DAlgernon Huxley MD, 100 mg at 12/03/21 0915   feeding supplement (PROSource TF) liquid 45 mL, 45 mL, Per Tube, TID, DAlgernon Huxley MD, 45 mL at 12/03/21 0916   feeding supplement (VITAL AF 1.2 CAL) liquid 1,000 mL, 1,000 mL, Per Tube, Q24H, Dew, JErskine Squibb MD, Last Rate: 20 mL/hr at 12/02/21 1343, 1,000 mL at 12/02/21 1343   fentaNYL (SUBLIMAZE) bolus via infusion 50-100 mcg, 50-100 mcg, Intravenous, Q15 min PRN, DAlgernon Huxley MD, 50 mcg at 11/30/21 0624   fentaNYL (SUBLIMAZE)  injection 12.5 mcg, 12.5 mcg, Intravenous, Q2H PRN, Algernon Huxley, MD, 12.5 mcg at 11/29/21 0905   fentaNYL 2511mg in NS 2538m(101mml) infusion-PREMIX, 50-200 mcg/hr, Intravenous, Continuous, Dew, JasErskine SquibbD, Last Rate: 5 mL/hr at 12/03/21 0900, 50 mcg/hr at 12/03/21 0900   free water 30 mL, 30 mL, Per Tube, Q4H, [place], EliBing NeighborsP, 30 mL at 12/03/21 0738   gentamicin cream (GARAMYCIN) 0.1 % 1 application, 1 application, Topical, Daily, Dew, JasErskine SquibbD, 1 application at 02/56/38/7516   heparin ADULT infusion 100 units/mL (25000 units/250m6m2,300 Units/hr, Intravenous,  Continuous, Dew, JasoErskine Squibb, Stopped at 12/03/21 0920   heparin sodium (porcine) injection 1,000-6,000 Units, 1,000-6,000 Units, Intracatheter, PRN, SingMurlean Iba, 3,000 Units at 12/03/21 0920   hydrocortisone sodium succinate (SOLU-CORTEF) 100 MG injection 100 mg, 100 mg, Intravenous, Q8H, Dew, JasoErskine Squibb, 100 mg at 12/03/21 0349   insulin aspart (novoLOG) injection 0-6 Units, 0-6 Units, Subcutaneous, Q4H, Dew, JasoErskine Squibb, 1 Units at 12/03/21 0804   ipratropium-albuterol (DUONEB) 0.5-2.5 (3) MG/3ML nebulizer solution 3 mL, 3 mL, Nebulization, Q6H, BresNelle Don, 3 mL at 12/03/21 0825   lactulose (CHRONULAC) 10 GM/15ML solution 30 g, 30 g, Per Tube, TID, GrubDallie PilesH, 30 g at 12/03/21 [id number] mouth rinse, 15 mL, Mouth Rinse, 10 times per day, Dew,Algernon Huxley, 15 mL at 12/03/21 0916   metroNIDAZOLE (FLAGYL) IVPB 500 mg, 500 mg, Intravenous, Q12H, Dew, JasoErskine Squibb, Stopped at 12/03/21 0004   midazolam (VERSED) 100 mg/100 mL (1 mg/mL) premix infusion, 0.5-10 mg/hr, Intravenous, Continuous, Dew, JasoErskine Squibb, Stopped at 12/11/2021 1800   multivitamin (RENA-VIT) tablet 1 tablet, 1 tablet, Per Tube, QHS, Dew,Algernon Huxley, 1 tablet at 12/03/21 0915   norepinephrine (LEVOPHED) 16 mg in 250mL86mmix infusion, 0-40 mcg/min, Intravenous, Titrated, Dew, JasonErskine Squibb Last Rate: 13.13 mL/hr at 12/03/21 0900, 14 mcg/min at 12/03/21 0900   pantoprazole (PROTONIX) injection 40 mg, 40 mg, Intravenous, Q24H, Dew, JasonErskine Squibb 40 mg at 12/03/21 0349   polyethylene glycol (MIRALAX / GLYCOLAX) packet 17 g, 17 g, Per Tube, Daily, Dew, JasonErskine Squibb 17 g at 12/03/21 0917 2951ismasol BGK 4/2.5 infusion, , CRRT, Continuous, SinghMurlean Iba Last Rate: 1,500 mL/hr at 12/03/21 0742, New Bag at 12/03/21 0742   sennosides (SENOKOT) 8.8 MG/5ML syrup 5 mL, 5 mL, Per Tube, BID, Dew, Algernon Huxley 5 mL at 12/03/21 0917   sodium chloride 0.9 % primer fluid for CRRT, , CRRT, PRN, SinghMurlean Iba   vancomycin (VANCOREADY) IVPB 1500 mg/300 mL, 1,500 mg, Intravenous, Q24H, ChappBenita Gutter, Paused at 12/02/21 2000   vasopressin (PITRESSIN) 20 Units in sodium chloride 0.9 % 100 mL infusion-*FOR SHOCK*, 0-0.03 Units/min, Intravenous, Continuous, Dew, JasonErskine Squibb Last Rate: 9 mL/hr at 12/03/21 0900, 0.03 Units/min at 12/03/21 0900  Labs ABG pH 7.23 A1c 5.1 LDL 76 Anti thrombin 67 L Homocysteine 27.6 H dsDNA <1, C3 107,  C4 17  2D echo IMPRESSION   1. Left ventricular ejection fraction, by estimation, is 55 to 60%. The left ventricle has normal function. The left ventricle has no regional wall motion abnormalities. There is mild concentric left ventricular hypertrophy. Left ventricular diastolic parameters are indeterminate. Elevated left atrial pressure.   2. Right ventricular systolic function is normal. The right ventricular size is normal.   3. Left atrial size was severely dilated.   4. Restricted anterior  and posterior mitral valve leaflet motion.  Compared with the echo in 2017, the mitral valve is now heavily thickened and clacified. These findings are all new. Could represent endocarditis.  Recommend TEE to better evaluate. The mitral valve is degenerative. Moderate mitral valve regurgitation. Severe mitral stenosis. Severe mitral annular calcification.   5. Tricuspid valve regurgitation is moderate to severe.   6. The aortic valve is tricuspid. Aortic valve regurgitation is not visualized. No aortic stenosis is present.   7. The inferior vena cava is normal in size with greater than 50% respiratory variability, suggesting right atrial pressure of 3 mmHg.   Imaging I have reviewed images in epic and the results pertinent to this consultation are: CT head - no acute changes.  No bleed.   MRI brain - scattered infarcts in lateral right frontal lobe, posterior left frontal lobe with patchy acute small infarcts in callosal splenium on right with additional subacute infarcts  within left frontoparietal white matter. Chronic hemorrhagic lacunar infarct in left caudate. Chronic white matter small vessel ischemic changes.  Small chronic cortical subcortical infarcts within the right parietal occipital lobes. 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    Foot x-ray concerning for osteomyelitis. Repeat CT head yesterday-negative for acute process US Venous Img Lower Bilateral  11/29/2021 - negative for evidence of deep venous thrombosis in either lower extremity to the level of the knees. Calf veins are not well seen due to patient body habitus and subcutaneous edema.  Repeat NCT head  11/20/2021 showed large subacute infarct in right temporal, parietal, and occipital lobes. Subacute infarct in the left central MCA distribution, posteriorly in left frontal lobe. Patchy new white matter hypodensities along internal capsules and periventricular white matter concerning for additional ischemic strokes. No overt intracranial hematoma and no current hydrocephalus or effacement of the basilar cisterns.  Repeat NCT head 12/03/2021 Ongoing evolution of extensive ischemia in the right cerebral hemisphere with increased indistinctness of the right basal ganglia, and worsened ischemia in the right superior frontal lobe. There is relative sparing of the anterior frontal lobe. 2. Additional evolving infarcts in the left MCA distribution. 3. New infarct in the right cerebellar hemisphere in the SCA distribution. 4. Mass effect from the right-sided infarcts has increased, with new effacement of the right lateral ventricle and basal cisterns, and new 9 mm leftward midline shift.  Assessment: 36 year old woman extensive vascular risk factors with acute encephalopathy found to have acute strokes. MRI showed infarcts of different ages not candidate for intervention. The following day developed expressive aphasia intact comprehension. Follow up NCT head without. Started  heparin per vascular surgery for possible LE thrombus and concern for endocarditis. COVID-19 (+) infection completed remdesivir.  s/p b/l LE amputations due to gangrene and sepsis 12/11/2021 and unreactive pupils later noted. CT head showed new multifocal large territory strokes with scattered smaller areas of stroke.   Unfortunately, her pupils became unreactive and her follow up CT head shows new infarct in the right cerebellar hemisphere and extensive evolving ischemia in right cerebral hemisphere, additional evolving infarcts in the left MCA distribution. There is relatively little brain that has not been affected and her stroke burden is not compatible with life and discussed CCM.   Impression Acute large territory multifocal embolic stroke  Subacute multifocal embolic strokes. Acute encephalopathy - Multifactorial. Septic shock - b/l LE gangrene (s/p b/l amputation), osteomyelitis and COVID. Peripheral vascular disease. Mitral valve abnormal on TTE - Concern endocarditis vs MS Severe left atrial  dilatation - Question underlying arrhythmia/PAF ESRD on dialysis. Worsening CHF  Plan: Acute embolic strokes: Possibly multifactorial in setting of sepsis with multiorgan failure, b/l LE osteomyelitis and COVID-19-associated coagulopathy. - SBP goal 100-140/MAP>65 from a neurology standpoint. - Continue heparin stroke protocol - Continue telemetry - Neurology will remain available, please call for questions.     Septic shock: Likely sepsis b/l LE osteomyelitis, COVID. - Management of antibiotics, ventilator management and pressor support per CCM.  ESRD: Management and HD per nephrology.  PAD: S/p guillotine amputations both feet. Management per vascular surgery.  This patient is critically ill and at significant risk of neurological worsening, death and care requires constant monitoring of vital signs, hemodynamics,respiratory and cardiac monitoring, neurological assessment,  discussion with family, other specialists and medical decision making of high complexity. I spent 60 minutes of neurocritical care time  in the care of  this patient. This was time spent independent of any time provided by nurse practitioner or PA.  Electronically signed by:  Lynnae Sandhoff, MD Page: [phone] 12/03/2021, 9:29 AM

## 2021-12-03 NOTE — Progress Notes (Signed)
*  PRELIMINARY RESULTS* Echocardiogram 2D Echocardiogram has been performed.  [name] 12/03/2021, 12:08 PM

## 2021-12-03 NOTE — Progress Notes (Signed)
° °  BRAIN DEATH DOCUMENTATION PHYSICAL EXAM 1   A. No Evidence of Reversible CNS Depression Temp 37.2 BMP without significant electrolytic abnormality on CRRT ABG pCO2 was in 40's Venous 63 SpO2 96% Sedation has been off since Analgesics (Fentanyl off since this morning) No Muscle blockade has been administered since intubation   B. Absence of Cortical Function: GCS 3 No response to painful stim at any extremity No spontaneous motion  No verbal response to stimuli  C. Pupils are fixed to light Corneals are absent to water strike There is no cough nor gag with movement of ETT or deep suction There are no Doll's Eyes Cold Calorics do not illicit any nystagmus  D. There are no spontaneous Respirations at the time of my exam  E. There is no response to the administration of Atropine         (testing of CN X)  //[name]

## 2021-12-03 NOTE — Progress Notes (Signed)
MEDICATION RELATED CONSULT NOTE - INITIAL   Pharmacy Consult for 3% saline Indication: cerebral edema  Allergies  Allergen Reactions   Percocet [Oxycodone-Acetaminophen] Itching   Tomato Itching   Gabapentin Other (See Comments)    Extremity tremors    Patient Measurements: Height: 5' 6"  (167.6 cm) Weight: (!) 137.8 kg (303 lb 12.7 oz) IBW/kg (Calculated) : 59.3  Vital Signs: Temp: 98.4 F (36.9 C) (02/17 1300) Temp Source: Esophageal (02/17 1300) Pulse Rate: 103 (02/17 1300) Intake/Output from previous day: 02/16 0701 - 02/17 0700 In: 2795.8 [I.V.:1375.7; NG/GT:720; IV Piggyback:700.2] Out: 2719  Intake/Output from this shift: Total I/O In: 1029.7 [I.V.:339.8; NG/GT:240.3; IV Piggyback:449.6] Out: 200 [Other:200]  Labs: Recent Labs    11/18/2021 0338 12/10/2021 0601 11/27/2021 1810 11/19/2021 2135 12/02/21 0407 12/02/21 1600 12/03/21 0359  WBC 36.5*  --   --   --  39.7*  --  34.3*  HGB 9.2*  --   --  9.1* 9.0*  --  8.4*  HCT 30.1*  --   --  29.6* 28.4*  --  27.0*  PLT 253  --   --   --  231  --  228  CREATININE 10.30*  --  10.56*  --  7.62* 5.59* 4.26*  MG  --  1.8  --   --  2.2  --  2.4  PHOS  --   --  5.8*  --   --  5.9*  --   ALBUMIN 2.5*  --  2.1*  --  2.1* 2.1* 2.1*  PROT 6.8  --   --   --  6.4*  --  6.3*  AST 20  --   --   --  36  --  30  ALT 6  --   --   --  8  --  11  ALKPHOS 135*  --   --   --  141*  --  131*  BILITOT 0.9  --   --   --  0.9  --  1.1   Estimated Creatinine Clearance: 26.1 mL/min (A) (by C-G formula based on SCr of 4.26 mg/dL (H)).   Microbiology: Recent Results (from the past 720 hour(s))  SARS CORONAVIRUS 2 (TAT 6-24 HRS) Nasopharyngeal Nasopharyngeal Swab     Status: None   Collection Time: 11/24/21  9:56 AM   Specimen: Nasopharyngeal Swab  Result Value Ref Range Status   SARS Coronavirus 2 NEGATIVE NEGATIVE Final    Comment: (NOTE) SARS-CoV-2 target nucleic acids are NOT DETECTED.  The SARS-CoV-2 RNA is generally detectable in  upper and lower respiratory specimens during the acute phase of infection. Negative results do not preclude SARS-CoV-2 infection, do not rule out co-infections with other pathogens, and should not be used as the sole basis for treatment or other patient management decisions. Negative results must be combined with clinical observations, patient history, and epidemiological information. The expected result is Negative.  Fact Sheet for Patients: SugarRoll.be  Fact Sheet for Healthcare Providers: https://www.woods-mathews.com/  This test is not yet approved or cleared by the [place] FDA and  has been authorized for detection and/or diagnosis of SARS-CoV-2 by FDA under an Emergency Use Authorization (EUA). This EUA will remain  in effect (meaning this test can be used) for the duration of the COVID-19 declaration under Se ction 564(b)(1) of the Act, 21 U.S.C. section 360bbb-3(b)(1), unless the authorization is terminated or revoked sooner.  Performed at [facility], [place] [address] [place], [place] [address]  Resp Panel by RT-PCR (Flu A&B, Covid) Nasopharyngeal Swab     Status: Abnormal   Collection Time: 11/30/2021  2:12 PM   Specimen: Nasopharyngeal Swab; Nasopharyngeal(NP) swabs in vial transport medium  Result Value Ref Range Status   SARS Coronavirus 2 by RT PCR POSITIVE (A) NEGATIVE Final    Comment: (NOTE) SARS-CoV-2 target nucleic acids are DETECTED.  The SARS-CoV-2 RNA is generally detectable in upper respiratory specimens during the acute phase of infection. Positive results are indicative of the presence of the identified virus, but do not rule out bacterial infection or co-infection with other pathogens not detected by the test. Clinical correlation with patient history and other diagnostic information is necessary to determine patient infection status. The expected result is Negative.  Fact Sheet for  Patients: EntrepreneurPulse.com.au  Fact Sheet for Healthcare Providers: IncredibleEmployment.be  This test is not yet approved or cleared by the [place] FDA and  has been authorized for detection and/or diagnosis of SARS-CoV-2 by FDA under an Emergency Use Authorization (EUA).  This EUA will remain in effect (meaning this test can be used) for the duration of  the COVID-19 declaration under Section 564(b)(1) of the A ct, 21 U.S.C. section 360bbb-3(b)(1), unless the authorization is terminated or revoked sooner.     Influenza A by PCR NEGATIVE NEGATIVE Final   Influenza B by PCR NEGATIVE NEGATIVE Final    Comment: (NOTE) The Xpert Xpress SARS-CoV-2/FLU/RSV plus assay is intended as an aid in the diagnosis of influenza from Nasopharyngeal swab specimens and should not be used as a sole basis for treatment. Nasal washings and aspirates are unacceptable for Xpert Xpress SARS-CoV-2/FLU/RSV testing.  Fact Sheet for Patients: EntrepreneurPulse.com.au  Fact Sheet for Healthcare Providers: IncredibleEmployment.be  This test is not yet approved or cleared by the [place] FDA and has been authorized for detection and/or diagnosis of SARS-CoV-2 by FDA under an Emergency Use Authorization (EUA). This EUA will remain in effect (meaning this test can be used) for the duration of the COVID-19 declaration under Section 564(b)(1) of the Act, 21 U.S.C. section 360bbb-3(b)(1), unless the authorization is terminated or revoked.  Performed at [facility], [address] [place], [place] [address]   CULTURE, BLOOD (ROUTINE X 2) w Reflex to ID Panel     Status: None   Collection Time: 11/27/21 10:32 AM   Specimen: BLOOD  Result Value Ref Range Status   Specimen Description BLOOD BLOOD RIGHT HAND  Final   Special Requests   Final    BOTTLES DRAWN AEROBIC AND ANAEROBIC Blood Culture adequate volume    Culture   Final    NO GROWTH 5 DAYS Performed at [facility], [address] [place], [place] [address]    Report Status 12/02/2021 FINAL  Final  CULTURE, BLOOD (ROUTINE X 2) w Reflex to ID Panel     Status: None   Collection Time: 11/27/21 10:32 AM   Specimen: BLOOD  Result Value Ref Range Status   Specimen Description BLOOD BLOOD RIGHT ARM  Final   Special Requests   Final    BOTTLES DRAWN AEROBIC AND ANAEROBIC Blood Culture adequate volume   Culture   Final    NO GROWTH 5 DAYS Performed at [facility], [address], [place], [place] [address]    Report Status 12/02/2021 FINAL  Final  MRSA Next Gen by PCR, Nasal     Status: None   Collection Time: 11/27/21  4:24 PM   Specimen: Nasal Mucosa; Nasal  Swab  Result Value Ref Range Status   MRSA by PCR Next Gen NOT DETECTED NOT DETECTED Final    Comment: (NOTE) The GeneXpert MRSA Assay (FDA approved for NASAL specimens only), is one component of a comprehensive MRSA colonization surveillance program. It is not intended to diagnose MRSA infection nor to guide or monitor treatment for MRSA infections. Test performance is not FDA approved in patients less than 52 years old. Performed at [facility], [address] [place], [place] [address]   CULTURE, BLOOD (ROUTINE X 2) w Reflex to ID Panel     Status: None   Collection Time: 11/28/21 11:42 AM   Specimen: BLOOD  Result Value Ref Range Status   Specimen Description BLOOD BLOOD RIGHT HAND  Final   Special Requests   Final    BOTTLES DRAWN AEROBIC AND ANAEROBIC Blood Culture adequate volume   Culture   Final    NO GROWTH 5 DAYS Performed at [facility], [address] [place], [place] [address]    Report Status 12/03/2021 FINAL  Final  CULTURE, BLOOD (ROUTINE X 2) w Reflex to ID Panel     Status: None   Collection Time: 11/28/21 11:42 AM   Specimen: BLOOD  Result Value Ref Range Status   Specimen Description BLOOD RIGHT ANTECUBITAL   Final   Special Requests   Final    AEROBIC BOTTLE ONLY Blood Culture results may not be optimal due to an inadequate volume of blood received in culture bottles   Culture   Final    NO GROWTH 5 DAYS Performed at [facility], [address] [place], [place] [address]    Report Status 12/03/2021 FINAL  Final  Wet prep, genital     Status: Abnormal   Collection Time: 11/29/21  9:16 AM   Specimen: Vaginal  Result Value Ref Range Status   Yeast Wet Prep HPF POC NONE SEEN NONE SEEN Final   Trich, Wet Prep NONE SEEN NONE SEEN Final   Clue Cells Wet Prep HPF POC PRESENT (A) NONE SEEN Final   WBC, Wet Prep HPF POC >=10 (A) <10 Final   Sperm NONE SEEN  Final    Comment: Performed at [facility], [address] [place], [place] [address]  Culture, Respiratory w Gram Stain     Status: None (Preliminary result)   Collection Time: 12/12/2021  2:51 PM   Specimen: Tracheal Aspirate; Respiratory  Result Value Ref Range Status   Specimen Description   Final    TRACHEAL ASPIRATE Performed at [facility], [address] [place], [place] [address]    Special Requests   Final    NONE Performed at [facility], [address] [place], [place] [address]    Gram Stain   Final    FEW WBC PRESENT, PREDOMINANTLY MONONUCLEAR NO ORGANISMS SEEN    Culture   Final    NO GROWTH 2 DAYS Performed at [facility], [place] [address] [place], [place] [address]    Report Status PENDING  Incomplete    Medical History: Past Medical History:  Diagnosis Date   Anemia of chronic disease    Asthma    well controlled-no inhaler   Calciphylaxis 2020   CHF (congestive heart failure) ([place])    CKD (chronic kidney disease), stage III ([place])    Diabetes ([place])    type 2-diet controlled   Dyspnea    Hypertension    Morbid obesity ([place])  Nephrotic syndrome    PAD (peripheral artery disease) ([facility])    Sepsis ([place]) 06/2021    Medications:  Scheduled:   vitamin  C  500 mg Per Tube BID   chlorhexidine gluconate (MEDLINE KIT)  15 mL Mouth Rinse BID   Chlorhexidine Gluconate Cloth  6 each Topical Daily   [START ON 2021-12-09] cholecalciferol  2,000 Units Per Tube Daily   collagenase   Topical Daily   docusate  100 mg Per Tube BID   feeding supplement (PROSource TF)  45 mL Per Tube TID   feeding supplement (VITAL AF 1.2 CAL)  1,000 mL Per Tube Q24H   free water  30 mL Per Tube Q4H   gentamicin cream  1 application Topical Daily   hydrocortisone sod succinate (SOLU-CORTEF) inj  100 mg Intravenous Q8H   insulin aspart  0-6 Units Subcutaneous Q4H   ipratropium-albuterol  3 mL Nebulization Q6H   mouth rinse  15 mL Mouth Rinse 10 times per day   multivitamin  1 tablet Per Tube QHS   pantoprazole (PROTONIX) IV  40 mg Intravenous Q24H   polyethylene glycol  17 g Per Tube Daily   sennosides  5 mL Per Tube BID    Assessment: 36 y/o female with h/o ESRD on PD, CHF, DM, HTN, secondary hyperparathyroidism, calciphylaxis and PAD who is admitted with COVID 19, stroke, AKI and 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 CTH shows evolving right sided infarcts and left MCA infarct, new infarct of the Right cerebellar hemisphere in SCA distribution, increasing mass effect with new 9 mm leftward midline shift. Neurology following, recommends hypertonic saline bolus and infusion.   Baseline Na: 135 mEq/L   Goal of Therapy:  Goal sodium 150 - 155 mEq/L If Na > 160 stop 3% saline   Plan:  250 mL 3% NaCl bolus  start 3% NaCl at 75 mL/hr  Check Na level Q 6 hours  If Na > 160 stop 3% saline Pharmacy will monitor peripherally and contact MD/RN if necessary   Dallie Piles 12/03/2021,1:24 PM

## 2021-12-03 NOTE — Progress Notes (Signed)
ID Patient is extremely ill Intubated  Today she had bilateral pupillary dilatation and underwent another CAT scan of the head which showed extensive ischemia in the right cerebral hemisphere with increased indistinctness of the right basal ganglia and worsened ischemia in the right superior frontal lobe.  Additionally evolving infarcts in the left MCA distribution.  New infarct in the right cerebellar hemisphere in the [place] distribution.  Mass effect on the right sided infarcts is increased with new effacement of the right lateral ventricle and basal cisterns and a new 9 mm leftward midline shift. Unresponsive No sedation On pressors Apnea test has been conducted. On the vent B/l air entry Tachycardia B/l guillotine amputation lower extremities- wound vac'  Labs CBC Latest Ref Rng & Units 12/03/2021 12/02/2021 12/14/2021  WBC 4.0 - 10.5 K/uL 34.3(H) 39.7(H) -  Hemoglobin 12.0 - 15.0 g/dL 8.4(L) 9.0(L) 9.1(L)  Hematocrit 36.0 - 46.0 % 27.0(L) 28.4(L) 29.6(L)  Platelets 150 - 400 K/uL 228 231 -     CMP Latest Ref Rng & Units 12/03/2021 12/03/2021 12/03/2021  Glucose 70 - 99 mg/dL - 175(H) -  BUN 6 - 20 mg/dL - 33(H) -  Creatinine 0.44 - 1.00 mg/dL - 4.02(H) -  Sodium 135 - 145 mmol/L 139 137 135  Potassium 3.5 - 5.1 mmol/L - 4.9 -  Chloride 98 - 111 mmol/L - 107 -  CO2 22 - 32 mmol/L - 26 -  Calcium 8.9 - 10.3 mg/dL - 8.2(L) -  Total Protein 6.5 - 8.1 g/dL - - -  Total Bilirubin 0.3 - 1.2 mg/dL - - -  Alkaline Phos 38 - 126 U/L - - -  AST 15 - 41 U/L - - -  ALT 0 - 44 U/L - - -  Microbiology 11/28/2021 blood culture no growth 12/06/2021 respiratory culture no growth  Imaging  Bilateral infarct with midline shift  Impression/recommendation Extensive infarcts of the brain bilateral with midline shift now progressing to brain death PFO questioned on 2D echo  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  Call if needed.

## 2021-12-03 NOTE — Progress Notes (Signed)
Spoke with pt's sister Frederik Pear via telephone regarding changes in Neuro exam and  CT Head results.  Discussed evolution of strokes with resultant mass effect and new leftward midline shift.  Given these findings, concern for progression to brain death.  Per Jon Billings, she and her mother will be able to arrive at bedside around 18:00.   Repeat Neuro examination performed with Dr. Merrilee Jansky at bedside.  Pt has NO BRAINSTEM REFLEXES (absent cough/gag/corneal reflexes, no respiratory drive when placed on spontaneous mode on vent, positive doll's eyes).  Pt has no response when atropine administered.  Findings concerning for brain death.  Discussed with Dr. Merrilee Jansky, will consider Apnea test once family arrives and have been updated in person.      Darel Hong, AGACNP-BC [facility] Prefer epic messenger for cross cover needs If after hours, please call E-link

## 2021-12-04 DIAGNOSIS — I639 Cerebral infarction, unspecified: Secondary | ICD-10-CM | POA: Diagnosis not present

## 2021-12-04 DIAGNOSIS — Z978 Presence of other specified devices: Secondary | ICD-10-CM

## 2021-12-04 DIAGNOSIS — Z9911 Dependence on respirator [ventilator] status: Secondary | ICD-10-CM

## 2021-12-04 DIAGNOSIS — Q219 Congenital malformation of cardiac septum, unspecified: Secondary | ICD-10-CM

## 2021-12-04 DIAGNOSIS — G935 Compression of brain: Secondary | ICD-10-CM

## 2021-12-04 LAB — COMPREHENSIVE METABOLIC PANEL
ALT: 11 U/L (ref 0–44)
AST: 26 U/L (ref 15–41)
Albumin: 2.3 g/dL — ABNORMAL LOW (ref 3.5–5.0)
Alkaline Phosphatase: 137 U/L — ABNORMAL HIGH (ref 38–126)
Anion gap: 11 (ref 5–15)
BUN: 32 mg/dL — ABNORMAL HIGH (ref 6–20)
CO2: 21 mmol/L — ABNORMAL LOW (ref 22–32)
Calcium: 8.7 mg/dL — ABNORMAL LOW (ref 8.9–10.3)
Chloride: 108 mmol/L (ref 98–111)
Creatinine, Ser: 3.06 mg/dL — ABNORMAL HIGH (ref 0.44–1.00)
GFR, Estimated: 20 mL/min — ABNORMAL LOW (ref >60)
Glucose, Bld: 193 mg/dL — ABNORMAL HIGH (ref 70–99)
Potassium: 4.4 mmol/L (ref 3.5–5.1)
Sodium: 140 mmol/L (ref 135–145)
Total Bilirubin: 1.5 mg/dL — ABNORMAL HIGH (ref 0.3–1.2)
Total Protein: 6.7 g/dL (ref 6.5–8.1)

## 2021-12-04 LAB — FERRITIN: Ferritin: 829 ng/mL — ABNORMAL HIGH (ref 11–307)

## 2021-12-04 LAB — C-REACTIVE PROTEIN: CRP: 17 mg/dL — ABNORMAL HIGH (ref <1.0)

## 2021-12-04 LAB — SODIUM
Sodium: 140 mmol/L (ref 135–145)
Sodium: 142 mmol/L (ref 135–145)

## 2021-12-04 LAB — RENAL FUNCTION PANEL
Albumin: 2.2 g/dL — ABNORMAL LOW (ref 3.5–5.0)
Anion gap: 13 (ref 5–15)
BUN: 29 mg/dL — ABNORMAL HIGH (ref 6–20)
CO2: 19 mmol/L — ABNORMAL LOW (ref 22–32)
Calcium: 8.9 mg/dL (ref 8.9–10.3)
Chloride: 109 mmol/L (ref 98–111)
Creatinine, Ser: 2.67 mg/dL — ABNORMAL HIGH (ref 0.44–1.00)
GFR, Estimated: 23 mL/min — ABNORMAL LOW (ref >60)
Glucose, Bld: 209 mg/dL — ABNORMAL HIGH (ref 70–99)
Phosphorus: 3.8 mg/dL (ref 2.5–4.6)
Potassium: 3.9 mmol/L (ref 3.5–5.1)
Sodium: 141 mmol/L (ref 135–145)

## 2021-12-04 LAB — GLUCOSE, CAPILLARY
Glucose-Capillary: 128 mg/dL — ABNORMAL HIGH (ref 70–99)
Glucose-Capillary: 191 mg/dL — ABNORMAL HIGH (ref 70–99)
Glucose-Capillary: 176 mg/dL — ABNORMAL HIGH (ref 70–99)
Glucose-Capillary: 163 mg/dL — ABNORMAL HIGH (ref 70–99)

## 2021-12-04 LAB — VANCOMYCIN, RANDOM: Vancomycin Rm: 23

## 2021-12-04 LAB — CULTURE, RESPIRATORY W GRAM STAIN: Culture: NO GROWTH

## 2021-12-04 LAB — HEPARIN LEVEL (UNFRACTIONATED)
Heparin Unfractionated: <0.1 IU/mL — ABNORMAL LOW (ref 0.30–0.70)
Heparin Unfractionated: 0.69 IU/mL (ref 0.30–0.70)

## 2021-12-04 LAB — CBC
HCT: 28 % — ABNORMAL LOW (ref 36.0–46.0)
Hemoglobin: 8.4 g/dL — ABNORMAL LOW (ref 12.0–15.0)
MCH: 31.3 pg (ref 26.0–34.0)
MCHC: 30 g/dL (ref 30.0–36.0)
MCV: 104.5 fL — ABNORMAL HIGH (ref 80.0–100.0)
Platelets: 213 10*3/uL (ref 150–400)
RBC: 2.68 MIL/uL — ABNORMAL LOW (ref 3.87–5.11)
RDW: 18.5 % — ABNORMAL HIGH (ref 11.5–15.5)
WBC: 34.7 10*3/uL — ABNORMAL HIGH (ref 4.0–10.5)
nRBC: 2.9 % — ABNORMAL HIGH (ref 0.0–0.2)

## 2021-12-04 LAB — D-DIMER, QUANTITATIVE: D-Dimer, Quant: 2.07 ug/mL-FEU — ABNORMAL HIGH (ref 0.00–0.50)

## 2021-12-04 LAB — PHOSPHORUS: Phosphorus: 4.2 mg/dL (ref 2.5–4.6)

## 2021-12-04 LAB — MAGNESIUM: Magnesium: 2.3 mg/dL (ref 1.7–2.4)

## 2021-12-04 MED ORDER — ONDANSETRON HCL 4 MG/2ML IJ SOLN: 4.0000 mg | Freq: Four times a day (QID) | INTRAMUSCULAR | Status: DC | PRN

## 2021-12-04 MED ORDER — MAGIC MOUTHWASH W/LIDOCAINE
15.0000 mL | Freq: Four times a day (QID) | ORAL | Status: DC | PRN
  Filled 2021-12-04: qty 15

## 2021-12-04 MED ORDER — GLYCOPYRROLATE 0.2 MG/ML IJ SOLN
INTRAMUSCULAR | Status: AC
  Administered 2021-12-04: 0.1 mg via INTRAVENOUS
  Filled 2021-12-04: qty 1

## 2021-12-04 MED ORDER — HALOPERIDOL 0.5 MG PO TABS
0.5000 mg | ORAL_TABLET | ORAL | Status: DC | PRN
  Filled 2021-12-04: qty 1

## 2021-12-04 MED ORDER — LORAZEPAM 2 MG/ML PO CONC
1.0000 mg | ORAL | Status: DC | PRN
  Filled 2021-12-04: qty 0.5

## 2021-12-04 MED ORDER — LORAZEPAM 1 MG PO TABS: 1.0000 mg | ORAL_TABLET | ORAL | Status: DC | PRN

## 2021-12-04 MED ORDER — GLYCOPYRROLATE 0.2 MG/ML IJ SOLN: 0.1000 mg | INTRAMUSCULAR | Status: DC | PRN

## 2021-12-04 MED ORDER — ONDANSETRON 4 MG PO TBDP
4.0000 mg | ORAL_TABLET | Freq: Four times a day (QID) | ORAL | Status: DC | PRN
  Filled 2021-12-04: qty 1

## 2021-12-04 MED ORDER — HALOPERIDOL LACTATE 5 MG/ML IJ SOLN
0.5000 mg | INTRAMUSCULAR | Status: DC | PRN
  Filled 2021-12-04: qty 1

## 2021-12-04 MED ORDER — VANCOMYCIN HCL 1250 MG/250ML IV SOLN
1250.0000 mg | INTRAVENOUS | Status: DC
  Filled 2021-12-04: qty 250

## 2021-12-04 MED ORDER — ACETAMINOPHEN 650 MG RE SUPP: 650.0000 mg | Freq: Four times a day (QID) | RECTAL | Status: DC | PRN

## 2021-12-04 MED ORDER — ACETAMINOPHEN 325 MG PO TABS: 650.0000 mg | ORAL_TABLET | Freq: Four times a day (QID) | ORAL | Status: DC | PRN

## 2021-12-04 MED ORDER — LORAZEPAM 2 MG/ML IJ SOLN
1.0000 mg | INTRAMUSCULAR | Status: DC | PRN
  Administered 2021-12-04: 1 mg via INTRAVENOUS
  Filled 2021-12-04: qty 1

## 2021-12-04 MED ORDER — HALOPERIDOL LACTATE 2 MG/ML PO CONC
0.5000 mg | ORAL | Status: DC | PRN
  Filled 2021-12-04: qty 0.3

## 2021-12-06 ENCOUNTER — Encounter: Payer: Medicare Other | Admitting: Podiatry

## 2021-12-07 LAB — PROTHROMBIN GENE MUTATION

## 2021-12-07 SURGERY — CREATION, TRACHEOSTOMY
Anesthesia: General

## 2021-12-08 ENCOUNTER — Encounter (INDEPENDENT_AMBULATORY_CARE_PROVIDER_SITE_OTHER): Payer: Medicare Other

## 2021-12-08 ENCOUNTER — Ambulatory Visit (INDEPENDENT_AMBULATORY_CARE_PROVIDER_SITE_OTHER): Payer: Medicare Other | Admitting: Nurse Practitioner

## 2021-12-08 ENCOUNTER — Ambulatory Visit: Payer: Medicare Other | Admitting: Surgery

## 2021-12-08 ENCOUNTER — Telehealth: Payer: Self-pay | Admitting: Urgent Care

## 2021-12-09 NOTE — Progress Notes (Signed)
°  Perioperative Services Pre-Admission/Anesthesia Testing    Date: 12/09/21  Name: [name] MRN:   [id number]  Re: [place] notification that MyChart message had not been read. Of note, I ended up speaking with patient on the phone about the HYPOkalemia for which I also sent the MyChart message about. In attempts to close out message, I learned that patient had passed away during recent admission to the hospital.   Honor Loh, MSN, APRN, FNP-C, [place]  Peri-operative Services Nurse Practitioner Phone: [phone] 12/09/21 9:06 AM  NOTE: This note has been prepared using Dragon dictation software. Despite my best ability to proofread, there is always the potential that unintentional transcriptional errors may still occur from this process.

## 2021-12-15 LAB — IMMUNOFIXATION ELECT: Cryoglobulin %: 0 %

## 2021-12-15 LAB — CRYOGLOBULIN

## 2021-12-15 NOTE — Consult Note (Signed)
[place] for heparin Indication:  Thrombus  Allergies  Allergen Reactions   Percocet [Oxycodone-Acetaminophen] Itching   Tomato Itching   Gabapentin Other (See Comments)    Extremity tremors    Patient Measurements: Height: 5\' 6"  (167.6 cm) Weight: (!) 139.2 kg (306 lb 14.1 oz) IBW/kg (Calculated) : 59.3 Heparin Dosing Weight: 92.3 Kg  Vital Signs: Temp: 99.5 F (37.5 C) (02/18 0400) Temp Source: Esophageal (02/18 0400) Pulse Rate: 113 (02/18 0400)  Labs: Recent Labs    12/02/21 0407 12/02/21 1600 12/02/21 2046 12/03/21 0359 12/03/21 1510 Dec 09, 2021 0404  HGB 9.0*  --   --  8.4*  --  8.4*  HCT 28.4*  --   --  27.0*  --  28.0*  PLT 231  --   --  228  --  213  HEPARINUNFRC  --   --  0.42 0.48  --  <0.10*  CREATININE 7.62*   < >  --  4.26* 4.02* 3.06*   < > = values in this interval not displayed.     Estimated Creatinine Clearance: 36.6 mL/min (A) (by C-G formula based on SCr of 3.06 mg/dL (H)).   Medical History: Past Medical History:  Diagnosis Date   Anemia of chronic disease    Asthma    well controlled-no inhaler   Calciphylaxis 2020   CHF (congestive heart failure) ([place])    CKD (chronic kidney disease), stage III ([place])    Diabetes ([place])    type 2-diet controlled   Dyspnea    Hypertension    Morbid obesity ([place])    Nephrotic syndrome    PAD (peripheral artery disease) ([place])    Sepsis ([place]) 06/2021    Medications:  No chronic anticoagulation PTA Heparin Dosing Weight: 92.3 Kg  Assessment: Pharmacy has been consulted to initiate and monitor heparin in 35yo patient admitted with stroke-like symptoms. Her symptoms began to resolve, but neurologist noted concern for lower extremity ischemia, likely embolic. H&H, platelets have been stable. 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  Goal of Therapy:  Heparin level 0.3-0.5 units/ml (Per Stroke  Protocol) Monitor platelets by anticoagulation protocol: Yes  Date Time HL  Comment/rate 2/10 2222 HL < 0.10, subtherapeutic 2/11 0906 HL 0.12, subtherapeutic 2/11 2048 HL 0.22, subtherapeutic  2/12 0602 HL 0.44, therapeutic x 1 2/12 1500 HL 0.29, subtherapeutic; 2100>2200 un/hr 2/12 2356 HL 0.36, therapeutic x 1; 2200 un/hr 2/13 0916 HL 0.40, therapeutic x2; 2200 un/hr 2/14 0314 HL 0.51: 2200 >> 2100 units/hr 2/14 1339 HL 0.32: 2100 units/hr 2/14 2226 HL 0.26: 2100 >>2200 units/hr 2/15 0601 HL 0.24  2200 >>2300 units/hr 2/16 2046 HL 0.42 Therapeutic x 1 @ 2300 units/hr 2/17 0359 HL 0.48 Therapeutic X 2 @ 2300 units/hr 2/18 0404 HL < 0.1 SUBtherapeutic - heparin gtt stopped on 2/17 @ 1748   Plan:  2/18:  HL @ 0404:  < 0.1 , SUBtherapeutic  Heparin gtt was stopped on 2/17 @ 1748 but order still active in [place].  Unclear if heparin needs to continue , will need to f/u with MD in AM.   Violeta Gelinas D, PharmD Clinical Pharmacist   2021-12-09 7:07 AM

## 2021-12-15 NOTE — Progress Notes (Signed)
°[facility]  °ROUNDING NOTE  ° °Subjective:  ° °[name] was admitted to [facility] on 12/11/2021 for PVD (peripheral vascular disease) (HCC) [I73.9] °Pain [R52] °Dysarthria [R47.1] °Acute CVA (cerebrovascular accident) (HCC) [I63.9] °Cerebrovascular 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 (HCC) [I63.40] °COVID-19 [U07.1] ° °Patient still criticially ill. °Requiring vent support and CRRT. °However it appears that family moving towards comfort care.  °Awaiting additioanl famiy members.  ° °Objective:  °Vital signs in last 24 hours:  °Temp:  [97.3 °F (36.3 °C)-99.5 °F (37.5 °C)] 97.3 °F (36.3 °C) (02/18 1600) °Pulse Rate:  [95-117] 110 (02/18 1600) °Resp:  [0-29] 20 (02/18 1600) °SpO2:  [92 %-100 %] 100 % (02/18 1600) °Arterial Line BP: (89-127)/(74-107) 103/84 (02/18 1600) °FiO2 (%):  [60 %-100 %] 100 % (02/18 1600) °Weight:  [139.2 kg] 139.2 kg (02/18 0315) ° °Weight change: 1.4 kg °Filed Weights  ° 12/02/21 0317 12/03/21 0425 12/05/2021 0315  °Weight: (!) 138 kg (!) 137.8 kg (!) 139.2 kg  ° ° °Intake/Output: °I/O last 3 completed shifts: °In: 4911.6 [I.V.:3035.9; NG/GT:675; IV Piggyback:1200.7] °Out: 4424 [Emesis/NG output:500; Other:3924] °  °Intake/Output this shift: ° Total I/O °In: 1380.5 [I.V.:1280.6; IV Piggyback:99.9] °Out: 1380 [Drains:25; Other:1355] ° °Physical Exam: °General: Critically ill appearing  °Head: ET tube in place  °Lungs:  Ventilator assisted  °Heart: Regular  °Abdomen:  Soft, nontender, PD catheter, obese  °Extremities: B/l BKA  °Neurologic: Sedated  °Skin: Warm  °Access: PD catheter  °Right femoral temporary dialysis catheter 11/29/2021.  Dr. Dew ° °Basic Metabolic Panel: °Recent Labs  °Lab 11/30/21 °0314 12/10/2021 °0338 11/18/2021 °0601 11/28/2021 °1810 12/02/21 °0407 12/02/21 °1600 12/03/21 °0359 12/03/21 °1100 12/03/21 °1510 12/03/21 °2049 11/27/2021 °0308 12/11/2021 °0404 12/03/2021 °0923  °NA 135   < >  --  133* 133* 134* 134*   < > 137 139 140 140 142  °K 3.6   < >  --   4.4 4.2 4.2 4.3  --  4.9  --   --  4.4  --   °CL 95*   < >  --  91* 95* 97* 102  --  107  --   --  108  --   °CO2 20*   < >  --  20* 23 24 24  --  26  --   --  21*  --   °GLUCOSE 128*   < >  --  148* 183* 178* 187*  --  175*  --   --  193*  --   °BUN 56*   < >  --  53* 45* 37* 32*  --  33*  --   --  32*  --   °CREATININE 10.89*   < >  --  10.56* 7.62* 5.59* 4.26*  --  4.02*  --   --  3.06*  --   °CALCIUM 8.1*   < >  --  8.3* 8.5* 8.7* 8.5*  --  8.2*  --   --  8.7*  --   °MG 2.0  --  1.8  --  2.2  --  2.4  --   --   --   --  2.3  --   °PHOS 10.8*  --   --  5.8*  --  5.9*  --   --  5.8*  --   --  4.2  --   ° < > = values in this interval not displayed.  ° ° ° °Liver Function Tests: °Recent Labs  °  Lab 11/30/21 °0314 12/08/2021 °0338 11/25/2021 °1810 12/02/21 °0407 12/02/21 °1600 12/03/21 °0359 12/03/21 °1510 11/30/2021 °0404  °AST 10* 20  --  36  --  30  --  26  °ALT 5 6  --  8  --  11  --  11  °ALKPHOS 129* 135*  --  141*  --  131*  --  137*  °BILITOT 0.6 0.9  --  0.9  --  1.1  --  1.5*  °PROT 6.8 6.8  --  6.4*  --  6.3*  --  6.7  °ALBUMIN 2.7* 2.5*   < > 2.1* 2.1* 2.1* 2.0* 2.3*  ° < > = values in this interval not displayed.  ° ° °No results for input(s): LIPASE, AMYLASE in the last 168 hours. °No results for input(s): AMMONIA in the last 168 hours. ° °CBC: °Recent Labs  °Lab 11/30/21 °0430 12/07/2021 °0338 11/28/2021 °2135 12/02/21 °0407 12/03/21 °0359 11/29/2021 °0404  °WBC 30.5* 36.5*  --  39.7* 34.3* 34.7*  °HGB 8.8* 9.2* 9.1* 9.0* 8.4* 8.4*  °HCT 28.9* 30.1* 29.6* 28.4* 27.0* 28.0*  °MCV 101.8* 102.4*  --  98.3 100.4* 104.5*  °PLT 267 253  --  231 228 213  ° ° ° °Cardiac Enzymes: °Recent Labs  °Lab 11/20/2021 °0601  °CKTOTAL 913*  ° ° ° °BNP: °Invalid input(s): POCBNP ° °CBG: °Recent Labs  °Lab 12/03/21 °2305 11/27/2021 °0308 11/30/2021 °0809 12/06/2021 °1128 11/25/2021 °1542  °GLUCAP 132* 128* 191* 176* 163*  ° ° ° °Microbiology: °Results for orders placed or performed during the hospital encounter of 12/03/2021  °Resp Panel by  RT-PCR (Flu A&B, Covid) Nasopharyngeal Swab     Status: Abnormal  ° Collection Time: 11/30/2021  2:12 PM  ° Specimen: Nasopharyngeal Swab; Nasopharyngeal(NP) swabs in vial transport medium  °Result Value Ref Range Status  ° SARS Coronavirus 2 by RT PCR POSITIVE (A) NEGATIVE Final  °  Comment: (NOTE) °SARS-CoV-2 target nucleic acids are DETECTED. ° °The SARS-CoV-2 RNA is generally detectable in upper respiratory °specimens during the acute phase of infection. Positive results are °indicative of the presence of the identified virus, but do not rule °out bacterial infection or co-infection with other pathogens not °detected by the test. Clinical correlation with patient history and °other diagnostic information is necessary to determine patient °infection status. The expected result is Negative. ° °Fact Sheet for Patients: °https://www.fda.gov/media/152166/download ° °Fact Sheet for Healthcare Providers: °https://www.fda.gov/media/152162/download ° °This test is not yet approved or cleared by the [place] FDA and  °has been authorized for detection and/or diagnosis of SARS-CoV-2 by °FDA under an Emergency Use Authorization (EUA).  This EUA will °remain in effect (meaning this test can be used) for the duration of  °the COVID-19 declaration under Section 564(b)(1) of the A ct, 21 °U.S.C. section 360bbb-3(b)(1), unless the authorization is °terminated or revoked sooner. ° °  ° Influenza A by PCR NEGATIVE NEGATIVE Final  ° Influenza B by PCR NEGATIVE NEGATIVE Final  °  Comment: (NOTE) °The Xpert Xpress SARS-CoV-2/FLU/RSV plus assay is intended as an aid °in the diagnosis of influenza from Nasopharyngeal swab specimens and °should not be used as a sole basis for treatment. Nasal washings and °aspirates are unacceptable for Xpert Xpress SARS-CoV-2/FLU/RSV °testing. ° °Fact Sheet for Patients: °https://www.fda.gov/media/152166/download ° °Fact Sheet for Healthcare  Providers: °https://www.fda.gov/media/152162/download ° °This test is not yet approved or cleared by the [place] FDA and °has been authorized for detection and/or diagnosis of SARS-CoV-2 by °FDA under an Emergency Use   Authorization (EUA). This EUA will remain °in effect (meaning this test can be used) for the duration of the °COVID-19 declaration under Section 564(b)(1) of the Act, 21 U.S.C. °section 360bbb-3(b)(1), unless the authorization is terminated or °revoked. ° °Performed at [facility], [address] [place], °[place] [address] °  °CULTURE, BLOOD (ROUTINE X 2) w Reflex to ID Panel     Status: None  ° Collection Time: 11/27/21 10:32 AM  ° Specimen: BLOOD  °Result Value Ref Range Status  ° Specimen Description BLOOD BLOOD RIGHT HAND  Final  ° Special Requests   Final  °  BOTTLES DRAWN AEROBIC AND ANAEROBIC Blood Culture adequate volume  ° Culture   Final  °  NO GROWTH 5 DAYS °Performed at [facility], [address] [place], [place] [address] °  ° Report Status 12/02/2021 FINAL  Final  °CULTURE, BLOOD (ROUTINE X 2) w Reflex to ID Panel     Status: None  ° Collection Time: 11/27/21 10:32 AM  ° Specimen: BLOOD  °Result Value Ref Range Status  ° Specimen Description BLOOD BLOOD RIGHT ARM  Final  ° Special Requests   Final  °  BOTTLES DRAWN AEROBIC AND ANAEROBIC Blood Culture adequate volume  ° Culture   Final  °  NO GROWTH 5 DAYS °Performed at [facility], [address] [place], [place] [address] °  ° Report Status 12/02/2021 FINAL  Final  °MRSA Next Gen by PCR, Nasal     Status: None  ° Collection Time: 11/27/21  4:24 PM  ° Specimen: Nasal Mucosa; Nasal Swab  °Result Value Ref Range Status  ° MRSA by PCR Next Gen NOT DETECTED NOT DETECTED Final  °  Comment: (NOTE) °The GeneXpert MRSA Assay (FDA approved for NASAL specimens only), °is one component of a comprehensive MRSA colonization surveillance °program. It is not intended to diagnose MRSA infection nor to guide °or  monitor treatment for MRSA infections. °Test performance is not FDA approved in patients less than 2 years °old. °Performed at [facility], [address] [place], °[place] [address] °  °CULTURE, BLOOD (ROUTINE X 2) w Reflex to ID Panel     Status: None  ° Collection Time: 11/28/21 11:42 AM  ° Specimen: BLOOD  °Result Value Ref Range Status  ° Specimen Description BLOOD BLOOD RIGHT HAND  Final  ° Special Requests   Final  °  BOTTLES DRAWN AEROBIC AND ANAEROBIC Blood Culture adequate volume  ° Culture   Final  °  NO GROWTH 5 DAYS °Performed at [facility], [address] , [place] [address] °  ° Report Status 12/03/2021 FINAL  Final  °CULTURE, BLOOD (ROUTINE X 2) w Reflex to ID Panel     Status: None  ° Collection Time: 11/28/21 11:42 AM  ° Specimen: BLOOD  °Result Value Ref Range Status  ° Specimen Description BLOOD RIGHT ANTECUBITAL  Final  ° Special Requests   Final  °  AEROBIC BOTTLE ONLY Blood Culture results may not be optimal due to an inadequate volume of blood received in culture bottles  ° Culture   Final  °  NO GROWTH 5 DAYS °Performed at [facility], [address] , [place] [address] °  ° Report Status 12/03/2021 FINAL  Final  °Wet prep, genital     Status: Abnormal  ° Collection Time: 11/29/21  9:16 AM  ° Specimen: Vaginal  °Result Value Ref Range Status  ° Yeast Wet Prep HPF   POC NONE SEEN NONE SEEN Final  ° Trich, Wet Prep NONE SEEN NONE SEEN Final  ° Clue Cells Wet Prep HPF POC PRESENT (A) NONE SEEN Final  ° WBC, Wet Prep HPF POC >=10 (A) <10 Final  ° Sperm NONE SEEN  Final  °  Comment: Performed at [facility], [address] [place], [place] [address]  °Culture, Respiratory w Gram Stain     Status: None  ° Collection Time: 11/18/2021  2:51 PM  ° Specimen: Tracheal Aspirate; Respiratory  °Result Value Ref Range Status  ° Specimen Description   Final  °  TRACHEAL ASPIRATE °Performed at [facility], [address] [place],  [place] [address] °  ° Special Requests   Final  °  NONE °Performed at [facility], [address] [place], [place] [address] °  ° Gram Stain   Final  °  FEW WBC PRESENT, PREDOMINANTLY MONONUCLEAR °NO ORGANISMS SEEN °  ° Culture   Final  °  NO GROWTH °Performed at [facility], [address] [place], [place] [address] °  ° Report Status 12/03/2021 FINAL  Final  ° ° °Coagulation Studies: °No results for input(s): LABPROT, INR in the last 72 hours. ° ° °Urinalysis: °No results for input(s): COLORURINE, LABSPEC, PHURINE, GLUCOSEU, HGBUR, BILIRUBINUR, KETONESUR, PROTEINUR, UROBILINOGEN, NITRITE, LEUKOCYTESUR in the last 72 hours. ° °Invalid input(s): APPERANCEUR  ° ° °Imaging: °CT HEAD WO CONTRAST (5MM) ° °Result Date: 12/03/2021 °CLINICAL DATA:  Stroke follow-up EXAM: CT HEAD WITHOUT CONTRAST TECHNIQUE: Contiguous axial images were obtained from the base of the skull through the vertex without intravenous contrast. RADIATION DOSE REDUCTION: This exam was performed according to the departmental dose-optimization program which includes automated exposure control, adjustment of the mA and/or kV according to patient size and/or use of iterative reconstruction technique. COMPARISON:  CT head 12/13/2021 FINDINGS: Brain: Extensive infarcts are again seen in the right cerebral hemisphere involving the frontal, parietal, occipital, and temporal lobes with increased involvement of the frontal lobe superiorly. There is relative sparing of the anterior frontal lobe, with ongoing evolution since 11/22/2021. There is increased indistinctness of the basal ganglia. There is ongoing evolution of the infarct in the left MCA distribution. Mass effect from the right-sided infarcts has increased, with sulcal effacement, near-complete effacement of the right lateral ventricle, and new 9 mm midline shift. The basal cisterns are effaced, and there is crowding of the posterior fossa. There is a new infarct in the right cerebellar  hemisphere in the SCA distribution. There is no evidence of acute intracranial hemorrhage. Vascular: There is calcification of the bilateral cavernous ICAs and vertebral arteries, advanced for age. 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 Sinuses/Orbits: The paranasal sinuses are clear. The globes and orbits are unremarkable. Other: Endotracheal and enteric catheters are partially imaged. IMPRESSION: 1. Ongoing evolution of extensive ischemia in the right cerebral hemisphere with increased indistinctness of the right basal ganglia, and worsened ischemia in the right superior frontal lobe. There is relative sparing of the anterior frontal lobe. 2. Additional evolving infarcts in the left MCA distribution. 3. New infarct in the right cerebellar hemisphere in the SCA distribution. 4. Mass effect from the right-sided infarcts has increased, with new effacement of the right lateral ventricle and basal cisterns, and new 9 mm leftward midline shift. 5. No evidence of acute intracranial hemorrhage. These results were called by telephone at the time of interpretation on 12/03/2021 at 10:41 am to provider JEREMIAH KEENE , who verbally   acknowledged these results. Electronically Signed   By: Peter  Noone M.D.   On: 12/03/2021 10:42  ° °ECHOCARDIOGRAM LIMITED BUBBLE STUDY ° °Result Date: 12/03/2021 °   ECHOCARDIOGRAM LIMITED REPORT   Patient Name:   [name] Date of Exam: 12/03/2021 Medical Rec #:  [id number]         Height:       66.0 in Accession #:    [id number]        Weight:       303.8 lb Date of Birth:  07/13/1986         BSA:          2.389 m² Patient Age:    35 years          BP:           69/39 mmHg Patient Gender: F                 HR:           107 bpm. Exam Location:  [facility] Procedure: Limited Echo, Limited Color Doppler and Saline Contrast Bubble Study Indications:     I63.9 Stroke  History:         Patient has prior history of Echocardiogram examinations, most                  recent 11/27/2021. CHF, CKD  and PAD; Risk Factors:Hypertension                  and Diabetes. Pt tested positive for COVID-19 on 12/11/2021.  Sonographer:     Joan Heiss Referring Phys:  [id number] JEREMIAH D KEENE Diagnosing Phys: Timothy Gollan MD  Sonographer Comments: Technically difficult study due to poor echo windows and patient is morbidly obese. IMPRESSIONS  1. Agitated saline contrast bubble study was positive with shunting observed within 3-6 cardiac cycles suggestive of interatrial shunt. Suspected PFO.  2. Left ventricular ejection fraction, by estimation, is 55 to 60%. The left ventricle has normal function. The left ventricle has no regional wall motion abnormalities. There is mild left ventricular hypertrophy. Left ventricular diastolic parameters are indeterminate.  3. Right ventricular systolic function is normal. The right ventricular size is normal.  4. Left atrial size was mildly dilated.  5. The mitral valve is calcified. Moderate mitral valve regurgitation. Severe mitral annular calcification. Gradient not measured.  6. The aortic valve is normal in structure. Aortic valve regurgitation is not visualized. No aortic stenosis is present.  7. The inferior vena cava is dilated in size with <50% respiratory variability, suggesting right atrial pressure of 15 mmHg. FINDINGS  Left Ventricle: Left ventricular ejection fraction, by estimation, is 55 to 60%. The left ventricle has normal function. The left ventricle has no regional wall motion abnormalities. The left ventricular internal cavity size was normal in size. There is  mild left ventricular hypertrophy. Left ventricular diastolic parameters are indeterminate. Right Ventricle: The right ventricular size is normal. No increase in right ventricular wall thickness. Right ventricular systolic function is normal. Left Atrium: Left atrial size was mildly dilated. Right Atrium: Right atrial size was normal in size. Pericardium: There is no evidence of pericardial effusion. Mitral  Valve: The mitral valve is degenerative in appearance. There is moderate calcification of the mitral valve leaflet(s). Severe mitral annular calcification. Moderate mitral valve regurgitation. Tricuspid Valve: The tricuspid valve is normal in structure. Tricuspid valve regurgitation is not demonstrated. No evidence of tricuspid stenosis. Aortic Valve: The aortic valve is normal in   normal in structure. Aortic valve regurgitation is not visualized. No aortic stenosis is present. Pulmonic Valve: The pulmonic valve was normal in structure. Pulmonic valve regurgitation is not visualized. No evidence of pulmonic stenosis. Aorta: The aortic root is normal in size and structure. Venous: The inferior vena cava is dilated in size with less than 50% respiratory variability, suggesting right atrial pressure of 15 mmHg. IAS/Shunts: No atrial level shunt detected by color flow Doppler. Agitated saline contrast was given intravenously to evaluate for intracardiac shunting. Agitated saline contrast bubble study was positive with shunting observed within 3-6 cardiac cycles suggestive of interatrial shunt. LEFT VENTRICLE PLAX 2D LVIDd:         5.51 cm LVIDs:         4.50 cm LV PW:         1.12 cm LV IVS:        1.00 cm  LEFT ATRIUM         Index LA diam:    4.50 cm 1.88 cm/m Ida Rogue MD Electronically signed by Ida Rogue MD Signature Date/Time: 12/03/2021/12:57:45 PM    Final      Medications:    sodium chloride 5 mL/hr at 06-Dec-2021 1600   ceFEPime (MAXIPIME) IV Stopped (2021/12/06 1003)   feeding supplement (VITAL 1.5 CAL) Stopped (12/03/21 1700)   fentaNYL infusion INTRAVENOUS Stopped (12/03/21 1127)   heparin 2,300 Units/hr (December 06, 2021 1600)   midazolam Stopped (11/22/2021 1800)   norepinephrine (LEVOPHED) Adult infusion 40 mcg/min (06-Dec-2021 1600)   prismasol BGK 2/2.5 dialysis solution 1,500 mL/hr at 12-06-21 1434   prismasol BGK 2/2.5 replacement solution 300 mL/hr at 2021-12-06 1021   prismasol BGK 2/2.5 replacement  solution 300 mL/hr at 2021/12/06 1021   sodium chloride (hypertonic) 75 mL/hr at 2021-12-06 1600   vancomycin Stopped (12/03/21 2116)   vasopressin 0.03 Units/min (2021-12-06 1600)    vitamin C  500 mg Per Tube BID   chlorhexidine gluconate (MEDLINE KIT)  15 mL Mouth Rinse BID   Chlorhexidine Gluconate Cloth  6 each Topical Daily   cholecalciferol  2,000 Units Per Tube Daily   collagenase   Topical Daily   docusate  100 mg Per Tube BID   feeding supplement (PROSource TF)  45 mL Per Tube TID   free water  30 mL Per Tube Q4H   gentamicin cream  1 application Topical Daily   hydrocortisone sod succinate (SOLU-CORTEF) inj  100 mg Intravenous Q8H   insulin aspart  0-6 Units Subcutaneous Q4H   ipratropium-albuterol  3 mL Nebulization Q6H   mouth rinse  15 mL Mouth Rinse 10 times per day   multivitamin  1 tablet Per Tube QHS   pantoprazole (PROTONIX) IV  40 mg Intravenous Q24H   polyethylene glycol  17 g Per Tube Daily   sennosides  5 mL Per Tube BID   sodium chloride, acetaminophen **OR** acetaminophen (TYLENOL) oral liquid 160 mg/5 mL **OR** acetaminophen, albuterol, fentaNYL, fentaNYL (SUBLIMAZE) injection, heparin sodium (porcine), sodium chloride  Assessment/ Plan:  [name] is a 36 y.o. black female with end stage renal disease on peritoneal dialysis, hypertension, congestive heart failure, diabetes mellitus type II, peripheral vascular disease who is admitted to [facility] on 11/22/2021 for PVD (peripheral vascular disease) ([place]) [I73.9] Pain [R52] Dysarthria [R47.1] Acute CVA (cerebrovascular accident) ([place]) [I63.9] Cerebrovascular 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 ([place]) [I63.40] COVID-19 [U07.1]  COVID-19 + Patient was admitted as a code stroke. She was scheduled for debridement of her left foot  wound later that day. Procedure was cancelled.   [facility] Peritoneal [facility] Shanon Payor 132kg CCPD 9 hours 5 exchanges 3033m fills and last fill of 20055m End Stage  Renal Disease: With hypotension It appears patient's family moving towards comfort care. Continue CRRT until family ready to perform terminal extubation.  Anemia of kidney disease:   Lab Results  Component Value Date   HGB 8.4 (L) 022023/02/27 Hold off on EPO due to ischemic event.   Secondary Hyperparathyroidism with hyperphosphatemia and calciphylaxis: Labs from 2/7: PTH 1882, calcium 8 and phos 8.1. was getting sodium thiosulfate as outpatient with peritoneal dialysis. -No further need to monitor serum phosphorus.  4. 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 Hypoglycemia on 2/10. Hemoglobin A1c of 5.1% on admission -Once comfort care no need to follow blood glucose.  5. Altered mental status: with concerns for acute ischemic stroke.   MRI shows multiple chronic vessel small ischemic changes and lacunar infarcts concerning for embolic disease.  2D echo from November 27, 2021-LVEF 55 to 60%, mild LVH, elevated left atrial pressure and severe dilatation, restricted anterior and posterior mitral valve leaflet motion, mitral valve heavily thickened and calcified, could represent endocarditis, severe mitral stenosis, severe mitral annular calcifications, moderate to severe tricuspid regurgitation -Supportive care.  6. Peripheral vascular disease and left foot osteomyelitis causing sepsis: Antibiotics as per podiatry and vascular.  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 COVID-19 infection:  Respiratory isolation  Remdesivir completed.   8.  Acute respiratory failure Intubated 11/29/2021 Continued on ventilator support Management as per ICU team     LOS: 8 Atzel Mccambridge 2/Feb 27, 202330 PM

## 2021-12-15 NOTE — Progress Notes (Signed)
Plan to transition to [place] measures was confirmed with family at bedside. The family will make the medical team aware of when they would like to proceed with compassionate extubation and withdrawal of life-sustaining measures.  Comfort Care orders placed at this time per family wishes to facilitate expedient transition.  Bennie Pierini, MD 2021-12-10 6:52 PM

## 2021-12-15 NOTE — Death Summary Note (Signed)
DEATH SUMMARY   Patient Details  Name: [name] MRN: [id number] DOB: 1985-12-30  Admission/Discharge Information   Admit Date:  2021/12/18  Date of Death: Date of Death: Dec 26, 2021  Time of Death: Time of Death: 01/22/30  Length of Stay: 14-Jan-2023  Referring Physician: Danelle Berry, NP   Reason(s) for Hospitalization  CODE STROKE initiated due to complaints of aphasia and left-sided hemiparesis with correlating acute encephalopathy.  Diagnoses  Preliminary cause of death:  Secondary Diagnoses (including complications and co-morbidities):  Principal Problem:   Acute ischemic stroke (HCC) Active Problems:   Anemia in chronic kidney disease   Iron deficiency anemia   Chronic diastolic CHF (congestive heart failure) (HCC)   Obesity, Class III, BMI 40-49.9 (morbid obesity) ([place])   Encounter for continuous renal replacement therapy (CRRT) for end-stage renal disease ([place])   Essential hypertension   Septic shock (HCC)   Atherosclerosis of native arteries of the extremities with ulceration (HCC)   Ischemic foot   UMPNT-[id number]   Acute metabolic encephalopathy   Hyperphosphatemia   Metabolic acidosis   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 year old female who presented to [facility] ED with complaints of aphasia and left-sided hemiparesis starting around 11:30 AM on Dec 18, 2021.  Patient was originally scheduled for a vascular procedure on 2021-12-18 and prior to arriving she had taken her antihypertensives, at which point mother reported the patient became confused with correlating aphasia and left-sided  hemiparesis.  Code stroke initiated on arrival and she was evaluated by neurology, aphasia and left-sided hemiparesis improved however she remained encephalopathic.  MRI revealed acute and subacute CVA in multiple vascular territories and was admitted by hospitalist service to inpatient for further stroke work-up with neurology consulted.  Also on admission it was noted that the patient was pulseless in the right foot for which vascular surgery was consulted.  Nephrology was consulted to assist with peritoneal dialysis as the patient is ESRD on PD at home.  Patient was started on a heparin drip per vascular surgery with neurology's approval.  X-ray of the patient's left foot revealed postsurgical changes from transmetatarsal amputation with a new bony irregularity concerning for osteomyelitis.  Patient was placed on antibiotics with infectious disease consulted for additional recommendations.  Echocardiogram on 11/27/2021 concerning for endocarditis as there was restricted anterior and posterior mitral valve leaflet motion with severe mitral stenosis and moderate mitral regurgitation & Cardiology consulted with recommendations for TEE.  On 11/27/2021 patient also had new confusion and right-sided hemiparesis with stat CT ordered but when patient laid flat she became hypoxic with SPO2 in the 70s and was rapid response to stepdown unit with PCCM consult.  On 11/29/2021 patient remains encephalopathic with increased work of breathing requiring emergent intubation and mechanical ventilatory support.  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 Patient then developed worsening hypotension overnight requiring Levophed Neo-Synephrine and vasopressin drips as well as ventilator asynchrony not amenable to vent changes or increase sedation requiring vecuronium x1 dose.  CRRT was initiated for dialysis needs.  Neuro  exam revealed pupils unreactive and CT head showed new multifocal areas of stroke some of which are large territory.  12/03/2021 neuro exam again worsened with no cough/gag, pupils 5 mm and sluggish.  Stat repeat head CT showed evolving right-sided infarcts in the left MCA infarct with new infarct of the right cerebellar hemisphere and SCA distribution with increased mass effect and new 9 mm leftward midline shift.  3% hypertonic saline bolus and infusion started per neurology's recommendations.  Echo limited bubble study on 12/03/21 confirmed septal defect.  On 2021/12/06 the decision was made by family to transition to comfort care measures, patient was compassionately extubated with family present.   Pertinent Labs and Studies  Significant Diagnostic Studies DG Chest 1 View  Result Date: 12/14/2021 CLINICAL DATA:  Central line placement EXAM: CHEST  1 VIEW COMPARISON:  11/24/2021 at 3:47 a.m. FINDINGS: Endotracheal tube tip is somewhat indistinct but thought to be about 2.4 cm above the carina. Nasogastric tube indistinct but appears to enter the stomach. A left internal jugular central venous catheter is present tip projecting over the SVC. Low lung volumes are present, causing crowding of the pulmonary vasculature. Stable indistinct airspace opacity at the lung bases, right greater than left. The patient is rotated to the left on today's radiograph, reducing diagnostic sensitivity and specificity. IMPRESSION: 1. Left central line tip projects over the SVC. No visible pneumothorax. 2. Stable bibasilar airspace opacities, right greater than left. Low lung volumes. Electronically Signed   By: Van Clines M.D.   On: 12/12/2021 13:11   DG Chest 1 View  Result Date: 12/08/2021 CLINICAL DATA:  Pneumothorax EXAM: CHEST  1 VIEW COMPARISON:  11/30/2021 FINDINGS: Endotracheal tube and NG tube remain in place unchanged. Cardiomegaly with vascular congestion. Bilateral airspace opacities slightly worsened  since prior study, likely worsening edema. Decreasing lung volumes. No effusions. No acute bony abnormality. IMPRESSION: Cardiomegaly with mild vascular congestion and edema/CHF, slightly worsening since prior study. Low lung volumes. Electronically Signed   By: Rolm Baptise M.D.   On: 12/08/2021 03:55   CT HEAD WO CONTRAST (5MM)  Result Date: 12/03/2021 CLINICAL DATA:  Stroke follow-up EXAM: CT HEAD WITHOUT CONTRAST TECHNIQUE: Contiguous axial images were obtained from the base of the skull through the vertex without intravenous contrast. RADIATION DOSE REDUCTION: This exam was performed according to the departmental dose-optimization program which includes automated exposure control, adjustment of the mA and/or kV according to patient size and/or use of iterative reconstruction technique. COMPARISON:  CT head 11/22/2021 FINDINGS: Brain: Extensive infarcts are again seen in the right cerebral hemisphere involving the frontal, parietal, occipital, and temporal lobes with increased involvement of the frontal lobe superiorly. There is relative sparing of the anterior frontal lobe, with ongoing evolution since 12/06/2021. There is increased indistinctness of the basal ganglia. There is ongoing evolution of the infarct in the left MCA distribution. Mass effect from the right-sided infarcts has increased, with sulcal effacement, near-complete effacement of the right lateral ventricle, and new 9 mm midline shift. The basal cisterns are effaced, and there is crowding of the posterior fossa. There is a new infarct in the right cerebellar hemisphere in the SCA distribution. There is no evidence of acute intracranial hemorrhage. Vascular: There is calcification of the bilateral cavernous ICAs and vertebral arteries, advanced for age. 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 Sinuses/Orbits: The paranasal sinuses are clear. The globes and orbits are unremarkable. Other: Endotracheal and enteric catheters are partially  imaged. IMPRESSION: 1. Ongoing evolution of extensive ischemia  in the right cerebral hemisphere with increased indistinctness of the right basal ganglia, and worsened ischemia in the right superior frontal lobe. There is relative sparing of the anterior frontal lobe. 2. Additional evolving infarcts in the left MCA distribution. 3. New infarct in the right cerebellar hemisphere in the SCA distribution. 4. Mass effect from the right-sided infarcts has increased, with new effacement of the right lateral ventricle and basal cisterns, and new 9 mm leftward midline shift. 5. No evidence of acute intracranial hemorrhage. These results were called by telephone at the time of interpretation on 12/03/2021 at 10:41 am to provider [facility] , who verbally acknowledged these results. Electronically Signed   By: Valetta Mole M.D.   On: 12/03/2021 10:42   CT HEAD WO CONTRAST (5MM)  Result Date: 12/02/2021 CLINICAL DATA:  Follow up stroke EXAM: CT HEAD WITHOUT CONTRAST TECHNIQUE: Contiguous axial images were obtained from the base of the skull through the vertex without intravenous contrast. RADIATION DOSE REDUCTION: This exam was performed according to the departmental dose-optimization program which includes automated exposure control, adjustment of the mA and/or kV according to patient size and/or use of iterative reconstruction technique. COMPARISON:  Multiple exams, including 11/27/2021 and MRI brain from 11/30/2021 FINDINGS: Brain: Abnormal hypodensity and loss of gray-white differentiation throughout the right occipital lobe, a substantial portion of the right temporal lobe, and much of the right parietal lobe compatible with a large distribution infarct involving PCA and posterior MCA distribution on the right, substantially larger than the small perfusion defects seen on the right side on the MRI from 12/09/2021. There is also substantial vasogenic edema potentially some cytotoxic edema posteriorly in the left  frontal lobe, substantially disproportionate to the small infarcts shown on the 11/19/2021 exam likewise suspicious for progressive infarcts. No midline shift. New patchy white matter hypodensities in both frontal lobes have substantially increased from 11/27/2021 and are concerning for potential white matter infarcts or associated vasogenic edema. This includes hypodensities along the internal capsules and periventricular white matter. Strictly speaking in the context of the patient's lower extremity ischemia, infection is not completely excluded although ischemia seems more likely given the other findings intracranially. No large amount of intracranial hemorrhage. No hydrocephalus or effacement of the basilar cisterns. Vascular: No hyperdense MCA. Widespread age advanced atherosclerotic vascular calcifications as can be encountered in the setting of diabetes. Skull: Right frontal osteoma noted. Patchy demineralization in the calvarium is unchanged. Sinuses/Orbits: Unremarkable Other: No supplemental non-categorized findings. IMPRESSION: 1. Large subacute infarct involving the right temporal, parietal, and occipital lobes. Indistinct subacute infarct in the left central MCA distribution, posteriorly in the left frontal lobe. Patchy new white matter hypodensities along the internal capsules and periventricular white matter concerning for additional ischemic lesions in this clinical context. Overall this represents a marked worsening/progression compared to prior images of 12/11/2021 and 11/27/2021. No current overt intracranial hematoma identified. No current hydrocephalus or effacement of the basilar cisterns. These results were called by telephone at the time of interpretation on 11/28/2021 at 4:38 pm to provider DANA NELSON , who verbally acknowledged these results. Electronically Signed   By: Van Clines M.D.   On: 11/25/2021 16:45   CT HEAD WO CONTRAST (5MM)  Result Date: 11/27/2021 CLINICAL DATA:   Right-sided weakness.  Aphasia. EXAM: CT HEAD WITHOUT CONTRAST TECHNIQUE: Contiguous axial images were obtained from the base of the skull through the vertex without intravenous contrast. RADIATION DOSE REDUCTION: This exam was performed according to the departmental dose-optimization program which includes automated  exposure control, adjustment of the mA and/or kV according to patient size and/or use of iterative reconstruction technique. COMPARISON:  12/12/2021. FINDINGS: Brain: No evidence of acute infarction, hemorrhage, hydrocephalus, extra-axial collection or mass lesion/mass effect. Small infarcts described on the previous day's exam less well-defined due to patient motion on the current study. Vascular: No hyperdense vessel or unexpected calcification. Skull: No fracture. No discrete bone lesion. Large right frontal osteoma, stable. Sinuses/Orbits: Globes and orbits are unremarkable. Sinuses are clear. Other: None. IMPRESSION: 1. No acute intracranial abnormalities. No change from the previous day's study. Electronically Signed   By: Lajean Manes M.D.   On: 11/27/2021 15:15   MR BRAIN WO CONTRAST  Result Date: 11/22/2021 CLINICAL DATA:  Provided history: Neuro deficit, acute, stroke suspected. EXAM: MRI HEAD WITHOUT CONTRAST TECHNIQUE: Multiplanar, multiecho pulse sequences of the brain and surrounding structures were obtained without intravenous contrast. COMPARISON:  Noncontrast head CT performed immediately prior 12/08/2021. FINDINGS: Brain: An abbreviated protocol examination was performed at the provider's request. The following sequences were acquired: Axial and coronal diffusion-weighted imaging, axial T2 FLAIR sequence and axial SWI sequence. The axial T2 FLAIR sequence is moderately motion degraded, limiting evaluation. Subcentimeter acute cortically-based infarct within the posterior left frontal lobe (series 5, image 35). Subcentimeter acute cortically-based infarct within the lateral right  frontal lobe (at the anterior aspect of the right sylvian fissure)(series 5, image 28). Patchy small acute infarcts within the callosal splenium on the right. 12 mm focus of diffusion-weighted hyperintensity within the mid left frontal lobe white matter (series 5, image 32). No definite ADC correlate is appreciated. However, this finding is present on both the axial and coronal diffusion-weighted sequences, and this may reflect a recent infarct. Similarly, there is an ill-defined focus of diffusion-weighted hyperintensity within the left frontoparietal periventricular white matter measuring 10 mm (series 5, image 31). No definite ADC correlate is appreciated. However, this finding is present on both the axial and coronal diffusion-weighted sequences, and this may reflect a recent infarct. Small chronic cortical/subcortical infarct within the right parietooccipital lobes. Chronic lacunar infarct within the left caudate nucleus with associated chronic hemosiderin deposition at this site. Multifocal T2 FLAIR hyperintense signal abnormality within the cerebral white matter, nonspecific but compatible with chronic small vessel ischemic disease. These signal changes are overall mild, but advanced for age. No evidence of an intracranial mass or extra-axial fluid collection. No midline shift. Vascular: Flow voids poorly assessed in the absence of T2 TSE imaging. Skull and upper cervical spine: 1.9 cm bony protuberance projecting outward from the right frontal calvarium, likely reflecting an osteoma. Sinuses/Orbits: No acute orbital finding. Mild mucosal thickening within the bilateral ethmoid sinuses. Other: Trapped fluid within the left petrous apex. Impression #2 called by telephone at the time of interpretation on 11/30/2021 at 2:05 pm to provider [facility] , who verbally acknowledged these results. IMPRESSION: 1. Abbreviated protocol motion degraded examination, as described. 2. Subcentimeter acute cortically-based  infarcts within the lateral right frontal lobe and posterior left frontal lobe. Patchy small acute infarcts within the callosal splenium on the right. Additional small recent infarcts (measuring up to 12 mm) are questioned within the left frontoparietal white matter. Given small infarcts involving multiple vascular territories, there is suspicion for an embolic process. 3. Small chronic cortical/subcortical infarct within the right parietooccipital lobes. 4. Chronic hemorrhagic lacunar infarct within the left caudate nucleus. 5. Background chronic small-vessel ischemic changes within the cerebral white matter, mild but advanced for age. 6. 1.9 cm osteoma projecting outward from  the right frontal calvarium. 7. Mild mucosal thickening within the bilateral ethmoid sinuses. 8. Trapped fluid within the left petrous apex. Electronically Signed   By: Kellie Simmering D.O.   On: 12/13/2021 14:31   PERIPHERAL VASCULAR CATHETERIZATION  Result Date: 11/15/2021 See surgical note for result.  US Venous Img Lower Bilateral (DVT)  Result Date: 11/29/2021 CLINICAL DATA:  Acute ischemic stroke, bilateral lower extremity swelling. Assess for DVT. EXAM: BILATERAL LOWER EXTREMITY VENOUS DOPPLER ULTRASOUND TECHNIQUE: Gray-scale sonography with graded compression, as well as color Doppler and duplex ultrasound were performed to evaluate the lower extremity deep venous systems from the level of the common femoral vein and including the common femoral, femoral, profunda femoral, popliteal and calf veins including the posterior tibial, peroneal and gastrocnemius veins when visible. The superficial great saphenous vein was also interrogated. Spectral Doppler was utilized to evaluate flow at rest and with distal augmentation maneuvers in the common femoral, femoral and popliteal veins. COMPARISON:  None. FINDINGS: RIGHT LOWER EXTREMITY Common Femoral Vein: No evidence of thrombus. Normal compressibility, respiratory phasicity and response  to augmentation. Saphenofemoral Junction: No evidence of thrombus. Normal compressibility and flow on color Doppler imaging. Profunda Femoral Vein: No evidence of thrombus. Normal compressibility and flow on color Doppler imaging. Femoral Vein: No evidence of thrombus. Normal compressibility, respiratory phasicity and response to augmentation. Popliteal Vein: No evidence of thrombus. Normal compressibility, respiratory phasicity and response to augmentation. Calf Veins: Limited visualization of the calf veins. No obvious thrombus. Superficial Great Saphenous Vein: No evidence of thrombus. Normal compressibility. Venous Reflux:  None. Other Findings:  None. LEFT LOWER EXTREMITY Common Femoral Vein: No evidence of thrombus. Normal compressibility, respiratory phasicity and response to augmentation. Saphenofemoral Junction: No evidence of thrombus. Normal compressibility and flow on color Doppler imaging. Profunda Femoral Vein: No evidence of thrombus. Normal compressibility and flow on color Doppler imaging. Femoral Vein: No evidence of thrombus. Normal compressibility, respiratory phasicity and response to augmentation. Popliteal Vein: No evidence of thrombus. Normal compressibility, respiratory phasicity and response to augmentation. Calf Veins: Limited visualization of the calf veins. No obvious thrombus. Superficial Great Saphenous Vein: No evidence of thrombus. Normal compressibility. Venous Reflux:  None. Other Findings:  None. IMPRESSION: No evidence of deep venous thrombosis in either lower extremity to the level of the knees. Calf veins are not well seen due to patient body habitus and subcutaneous edema. Electronically Signed   By: Jacqulynn Cadet M.D.   On: 11/29/2021 07:06   DG Chest Port 1 View  Result Date: 11/30/2021 CLINICAL DATA:  Acute respiratory failure with hypoxia EXAM: PORTABLE CHEST 1 VIEW COMPARISON:  Chest radiograph from one day prior. FINDINGS: Endotracheal tube tip is 2.0 cm above  the carina. Enteric tube enters stomach with the tip not seen on this image. Stable cardiomediastinal silhouette with mild to moderate cardiomegaly. No pneumothorax. No pleural effusion. Low lung volumes. Mild pulmonary edema, improved. IMPRESSION: 1. Well-positioned support structures. 2. Mild congestive heart failure, improved. 3. Low lung volumes. Electronically Signed   By: Ilona Sorrel M.D.   On: 11/30/2021 08:08   Portable Chest x-ray  Result Date: 11/29/2021 CLINICAL DATA:  Intubated EXAM: PORTABLE CHEST 1 VIEW COMPARISON:  11/27/2021 FINDINGS: Initial film is rotated to the left which was repeated. Endotracheal tube approximately 2.5 cm above the carina. NG tube within the stomach. Heart is enlarged with vascular congestion and diffuse interstitial prominence suggesting component of mild interstitial edema. Difficult to exclude small posterior layering effusions. Associated bibasilar atelectasis. No large pneumothorax.  IMPRESSION: Limited exam but endotracheal tube approximately 2.5 cm above the carina. Cardiomegaly with mild interstitial edema pattern and basilar atelectasis. Electronically Signed   By: Jerilynn Mages.  Shick M.D.   On: 11/29/2021 12:08   DG Chest Port 1 View  Result Date: 11/27/2021 CLINICAL DATA:  Hypoxia. EXAM: PORTABLE CHEST 1 VIEW COMPARISON:  12/10/2021 FINDINGS: Cardiac silhouette mildly enlarged.  No mediastinal or hilar masses. Subtle hazy airspace opacity suggested in the left mid to upper lung. Remainder of the lungs is clear. No convincing pleural effusion or pneumothorax. Skeletal structures are grossly intact. IMPRESSION: 1. Subtle hazy airspace adjusted in the left mid to upper lung, which appears to be a change from the previous day's exam. This is consistent with pneumonia. Electronically Signed   By: Lajean Manes M.D.   On: 11/27/2021 15:17   DG Chest Port 1 View  Result Date: 11/28/2021 CLINICAL DATA:  Weakness EXAM: PORTABLE CHEST 1 VIEW COMPARISON:  07/07/2021 FINDINGS:  Stable cardiomegaly. Mild pulmonary vascular congestion. No overt pulmonary edema. No focal airspace consolidation. No pleural effusion or pneumothorax. IMPRESSION: Cardiomegaly with mild pulmonary vascular congestion. Electronically Signed   By: Davina Poke D.O.   On: 11/28/2021 18:38   DG Foot 2 Views Left  Result Date: 11/30/2021 CLINICAL DATA:  Pain EXAM: LEFT FOOT - 2 VIEW COMPARISON:  09/15/2021 FINDINGS: Postsurgical changes of transmetatarsal amputation of the first through fifth rays. There is new bony irregularity along the resection margins of the third, fourth, and fifth metatarsals. No acute fracture. No dislocation. No deep soft tissue ulceration is evident radiographically. Severe small vessel atherosclerotic calcifications. 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 New bony irregularity along the resection margins of the third, fourth, and fifth metatarsals concerning for osteomyelitis. Electronically Signed   By: Davina Poke D.O.   On: 11/21/2021 18:37   VAS [place] ABI WITH/WO TBI  Result Date: 11/15/2021  LOWER EXTREMITY DOPPLER STUDY Patient Name:  [name]  Date of Exam:   11/09/2021 Medical Rec #: [id number]          Accession #:    [id number] Date of Birth: 28-Feb-1986          Patient Gender: F Patient Age:   42 years Exam Location:  [facility] Procedure:      VAS [place] ABI WITH/WO TBI Referring Phys: [name] --------------------------------------------------------------------------------  Indications: Peripheral artery disease.  Vascular Interventions: 05/20/2021 Rt mechanical thrombecomy tibioperoneal trunk                         and prox bilateral PTA and peroneal artery. PTA of right                         PTA and peroneal arteries. Left distal SFA and above                         knee popiteal artery with stent.                         05/27/2021 PTA of left peroneal artery. Sten left distal                          SFA and above knee popliteal artery.  08/04/2021: Aorta and Selective Right Lower Extremity                         Angiogram. Mechanical Thrombectomy of the Right Distal                         SFA and of the Tibioperoneal Trunk with penumbra CAT 6                         device. PTA of the Right Tibioperoneal trunk and                         Proximal Peroneal Artery with 3 mm diameter angioplasty                         balloon. PTA of the Right Anterior tibial Artery with 2                         mm diameter angioplasty balloon. PTA of the Right                         Posterior tibial Artery with 57mm diameter angioplasty                         balloon into the foot and then multiple inflations with                         a 3 mm diameter angioplasty balloon from just above the                         ankle up to the Tibioperoneal trunk. Primary Stent                         placement to the Right distal SFA fro the Thromotic                         lesion. Comparison Study: 07/30/2021 Performing Technologist: Almira Coaster RVS  Examination Guidelines: A complete evaluation includes at minimum, Doppler waveform signals and systolic blood pressure reading at the level of bilateral brachial, anterior tibial, and posterior tibial arteries, when vessel segments are accessible. Bilateral testing is considered an integral part of a complete examination. Photoelectric Plethysmograph (PPG) waveforms and toe systolic pressure readings are included as required and additional duplex testing as needed. Limited examinations for reoccurring indications may be performed as noted.  ABI Findings: +---------+------------------+-----+--------+--------+  Right     Rt Pressure (mmHg) Index Waveform Comment   +---------+------------------+-----+--------+--------+  Brachial  152                                         +---------+------------------+-----+--------+--------+  PTA       118                 0.78                     +---------+------------------+-----+--------+--------+  Great Toe 11  0.07                     +---------+------------------+-----+--------+--------+ +---------+------------------+-----+--------+--------------+  Left      Lt Pressure (mmHg) Index Waveform Comment         +---------+------------------+-----+--------+--------------+  ATA       110                0.72                           +---------+------------------+-----+--------+--------------+  PERO      104                0.68                           +---------+------------------+-----+--------+--------------+  Great Toe                                   Toes Amputated  +---------+------------------+-----+--------+--------------+ +-------+-----------+--------------+------------+------------+  ABI/TBI Today's ABI Today's TBI    Previous ABI Previous TBI  +-------+-----------+--------------+------------+------------+  Right   .78         .07            0            0             +-------+-----------+--------------+------------+------------+  Left    .72         Toes Amputated 0            0             +-------+-----------+--------------+------------+------------+ Bilateral ABIs appear increased compared to prior study on 07/30/2021.  Summary: Right: Resting right ankle-brachial index indicates moderate right lower extremity arterial disease. The right toe-brachial index is abnormal. Left: Resting left ankle-brachial index indicates moderate left lower extremity arterial disease.  *See table(s) above for measurements and observations.  Electronically signed by [name] MD on 11/15/2021 at 10:19:43 AM.    Final    ECHOCARDIOGRAM COMPLETE  Result Date: 11/27/2021    ECHOCARDIOGRAM REPORT   Patient Name:   [name] Date of Exam: 11/27/2021 Medical Rec #:  [id number]         Height:       66.0 in Accession #:    [id number]        Weight:       289.0 lb Date of Birth:  [birth year]         BSA:          2.339  m Patient Age:    80 years          BP:           104/91 mmHg Patient Gender: F                 HR:           107 bpm. Exam Location:  [facility] Procedure: 2D Echo Indications:     Stroke I63.9  History:         Patient has prior history of Echocardiogram examinations, most                  recent 02/12/2016.  Sonographer:     Kathlen Brunswick RDCS Referring Phys:  Day Diagnosing Phys: Skeet Latch MD  Sonographer Comments: Image acquisition challenging  due to uncooperative patient. Non-verbal uncooperative patient. IMPRESSIONS  1. Left ventricular ejection fraction, by estimation, is 55 to 60%. The left ventricle has normal function. The left ventricle has no regional wall motion abnormalities. There is mild concentric left ventricular hypertrophy. Left ventricular diastolic parameters are indeterminate. Elevated left atrial pressure.  2. Right ventricular systolic function is normal. The right ventricular size is normal.  3. Left atrial size was severely dilated.  4. Restricted anterior and posterior mitral valve leaflet motion. Compared with the echo in 2017, the mitral valve is now heavily thickened and clacified. These findings are all new. Could represent endocarditis. Recommend TEE to better evaluate. The mitral valve is degenerative. Moderate mitral valve regurgitation. Severe mitral stenosis. Severe mitral annular calcification.  5. Tricuspid valve regurgitation is moderate to severe.  6. The aortic valve is tricuspid. Aortic valve regurgitation is not visualized. No aortic stenosis is present.  7. The inferior vena cava is normal in size with greater than 50% respiratory variability, suggesting right atrial pressure of 3 mmHg. FINDINGS  Left Ventricle: Left ventricular ejection fraction, by estimation, is 55 to 60%. The left ventricle has normal function. The left ventricle has no regional wall motion abnormalities. The left ventricular internal cavity size was normal in size. There is   mild concentric left ventricular hypertrophy. Left ventricular diastolic parameters are indeterminate. Elevated left atrial pressure. Right Ventricle: The right ventricular size is normal. No increase in right ventricular wall thickness. Right ventricular systolic function is normal. Left Atrium: Left atrial size was severely dilated. Right Atrium: Right atrial size was normal in size. Pericardium: There is no evidence of pericardial effusion. Mitral Valve: Restricted anterior and posterior mitral valve leaflet motion. Compared with the echo in 2017, the mitral valve is now heavily thickened and clacified. These findings are all new. Could represent endocarditis. Recommend TEE to better evaluate. The mitral valve is degenerative in appearance. There is severe thickening of the anterior mitral valve leaflet(s). There is severe calcification of the anterior mitral valve leaflet(s). Severe mitral annular calcification. Moderate mitral valve regurgitation. Severe mitral valve stenosis. MV peak gradient, 24.8 mmHg. The mean mitral valve gradient is 15.0 mmHg. Tricuspid Valve: The tricuspid valve is normal in structure. Tricuspid valve regurgitation is moderate to severe. No evidence of tricuspid stenosis. Aortic Valve: The aortic valve is tricuspid. Aortic valve regurgitation is not visualized. No aortic stenosis is present. Aortic valve peak gradient measures 6.0 mmHg. Pulmonic Valve: The pulmonic valve was normal in structure. Pulmonic valve regurgitation is not visualized. No evidence of pulmonic stenosis. Aorta: The aortic root is normal in size and structure. Venous: The inferior vena cava is normal in size with greater than 50% respiratory variability, suggesting right atrial pressure of 3 mmHg. IAS/Shunts: No atrial level shunt detected by color flow Doppler.  LEFT VENTRICLE PLAX 2D LVIDd:         4.94 cm      Diastology LVIDs:         3.45 cm      LV e' medial:    6.42 cm/s LV PW:         1.30 cm      LV E/e'  medial:  30.8 LV IVS:        1.22 cm      LV e' lateral:   5.44 cm/s LVOT diam:     2.00 cm      LV E/e' lateral: 36.4 LV SV:         43  LV SV Index:   18 LVOT Area:     3.14 cm  LV Volumes (MOD) LV vol d, MOD A4C: 105.0 ml LV vol s, MOD A4C: 33.0 ml LV SV MOD A4C:     105.0 ml RIGHT VENTRICLE RV Basal diam:  3.61 cm RV S prime:     11.10 cm/s TAPSE (M-mode): 1.5 cm LEFT ATRIUM              Index        RIGHT ATRIUM           Index LA diam:        3.70 cm  1.58 cm/m   RA Area:     18.10 cm LA Vol (A2C):   60.0 ml  25.66 ml/m  RA Volume:   46.90 ml  20.06 ml/m LA Vol (A4C):   100.0 ml 42.76 ml/m LA Biplane Vol: 80.5 ml  34.42 ml/m  AORTIC VALVE AV Area (Vmax): 2.32 cm AV Vmax:        122.00 cm/s AV Peak Grad:   6.0 mmHg LVOT Vmax:      90.10 cm/s LVOT Vmean:     62.400 cm/s LVOT VTI:       0.137 m  AORTA Ao Root diam: 2.40 cm Ao Asc diam:  2.90 cm MITRAL VALVE                TRICUSPID VALVE MV Area (PHT): 1.80 cm     TV Peak grad:   53.1 mmHg MV Area VTI:   0.91 cm     TV Vmax:        3.64 m/s MV Peak grad:  24.8 mmHg    TR Peak grad:   54.8 mmHg MV Mean grad:  15.0 mmHg    TR Vmax:        370.00 cm/s MV Vmax:       2.49 m/s MV Vmean:      182.5 cm/s   SHUNTS MV Decel Time: 421 msec     Systemic VTI:  0.14 m MR Peak grad: 86.5 mmHg     Systemic Diam: 2.00 cm MR Mean grad: 56.0 mmHg MR Vmax:      465.00 cm/s MR Vmean:     352.0 cm/s MV E velocity: 198.00 cm/s Skeet Latch MD Electronically signed by Skeet Latch MD Signature Date/Time: 11/27/2021/3:04:41 PM    Final    ECHOCARDIOGRAM LIMITED BUBBLE STUDY  Result Date: 12/03/2021    ECHOCARDIOGRAM LIMITED REPORT   Patient Name:   [name] Date of Exam: 12/03/2021 Medical Rec #:  [id number]         Height:       66.0 in Accession #:    [id number]        Weight:       303.8 lb Date of Birth:  [birth year]         BSA:          2.389 m Patient Age:    35 years          BP:           69/39 mmHg Patient Gender: F                 HR:           107  bpm. Exam Location:  [facility] Procedure: Limited Echo, Limited Color Doppler and Saline Contrast Bubble Study Indications:     I63.9 Stroke  History:  Patient has prior history of Echocardiogram examinations, most                  recent 11/27/2021. CHF, CKD and PAD; Risk Factors:Hypertension                  and Diabetes. Pt tested positive for COVID-19 on 12/10/2021.  Sonographer:     Charmayne Sheer Referring Phys:  [id number] Bradly Bienenstock Diagnosing Phys: Ida Rogue MD  Sonographer Comments: Technically difficult study due to poor echo windows and patient is morbidly obese. IMPRESSIONS  1. Agitated saline contrast bubble study was positive with shunting observed within 3-6 cardiac cycles suggestive of interatrial shunt. Suspected PFO.  2. Left ventricular ejection fraction, by estimation, is 55 to 60%. The left ventricle has normal function. The left ventricle has no regional wall motion abnormalities. There is mild left ventricular hypertrophy. Left ventricular diastolic parameters are indeterminate.  3. Right ventricular systolic function is normal. The right ventricular size is normal.  4. Left atrial size was mildly dilated.  5. The mitral valve is calcified. Moderate mitral valve regurgitation. Severe mitral annular calcification. Gradient not measured.  6. The aortic valve is normal in structure. Aortic valve regurgitation is not visualized. No aortic stenosis is present.  7. The inferior vena cava is dilated in size with <50% respiratory variability, suggesting right atrial pressure of 15 mmHg. FINDINGS  Left Ventricle: Left ventricular ejection fraction, by estimation, is 55 to 60%. The left ventricle has normal function. The left ventricle has no regional wall motion abnormalities. The left ventricular internal cavity size was normal in size. There is  mild left ventricular hypertrophy. Left ventricular diastolic parameters are indeterminate. Right Ventricle: The right ventricular size is normal. No  increase in right ventricular wall thickness. Right ventricular systolic function is normal. Left Atrium: Left atrial size was mildly dilated. Right Atrium: Right atrial size was normal in size. Pericardium: There is no evidence of pericardial effusion. Mitral Valve: The mitral valve is degenerative in appearance. There is moderate calcification of the mitral valve leaflet(s). Severe mitral annular calcification. Moderate mitral valve regurgitation. Tricuspid Valve: The tricuspid valve is normal in structure. Tricuspid valve regurgitation is not demonstrated. No evidence of tricuspid stenosis. Aortic Valve: The aortic valve is normal in structure. Aortic valve regurgitation is not visualized. No aortic stenosis is present. Pulmonic Valve: The pulmonic valve was normal in structure. Pulmonic valve regurgitation is not visualized. No evidence of pulmonic stenosis. Aorta: The aortic root is normal in size and structure. Venous: The inferior vena cava is dilated in size with less than 50% respiratory variability, suggesting right atrial pressure of 15 mmHg. IAS/Shunts: No atrial level shunt detected by color flow Doppler. Agitated saline contrast was given intravenously to evaluate for intracardiac shunting. Agitated saline contrast bubble study was positive with shunting observed within 3-6 cardiac cycles suggestive of interatrial shunt. LEFT VENTRICLE PLAX 2D LVIDd:         5.51 cm LVIDs:         4.50 cm LV PW:         1.12 cm LV IVS:        1.00 cm  LEFT ATRIUM         Index LA diam:    4.50 cm 1.88 cm/m Ida Rogue MD Electronically signed by Ida Rogue MD Signature Date/Time: 12/03/2021/12:57:45 PM    Final    CT HEAD CODE STROKE WO CONTRAST  Result Date: 12/06/2021 CLINICAL DATA:  Code stroke. Provided  history: Neuro deficit, acute, stroke suspected. Additional history provided: Aphasia, left-sided weakness. Last known normal 11:30 a.m. EXAM: CT HEAD WITHOUT CONTRAST TECHNIQUE: Contiguous axial images  were obtained from the base of the skull through the vertex without intravenous contrast. RADIATION DOSE REDUCTION: This exam was performed according to the departmental dose-optimization program which includes automated exposure control, adjustment of the mA and/or kV according to patient size and/or use of iterative reconstruction technique. COMPARISON:  Report from head CT 02/21/2002 (images unavailable). FINDINGS: Brain: Mild generalized cerebral atrophy, advanced for age. Small chronic cortical/subcortical infarct within the right parietooccipital lobes. Patchy and ill-defined hypoattenuation within the cerebral white matter, nonspecific but compatible with chronic small vessel ischemic disease. Findings are overall mild, but advanced for age. Small age-indeterminate lacunar infarct within the anterior limb of right internal capsule (series 3, image 18) (series 5, image 32). 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 Scattered supratentorial calcifications, some many of which appear parenchymal. There is no acute intracranial hemorrhage. No acute demarcated cortical infarct is identified. No extra-axial fluid collection. No evidence of an intracranial mass. No midline shift. Vascular: No hyperdense vessel. Atherosclerotic calcifications. Skull: No calvarial fracture. Bony protuberance projecting outward from the right frontal calvarium, measuring 1.9 x 1.1 cm in transaxial dimensions, compatible with an osteoma (series 4, image 42). Sinuses/Orbits: Visualized orbits show no acute finding. No significant paranasal sinus disease. ASPECTS ([place] Stroke Program Early CT Score) - Ganglionic level infarction (caudate, lentiform nuclei, internal capsule, insula, M1-M3 cortex): 6 - Supraganglionic infarction (M4-M6 cortex): 3 Total score (0-10 with 10 being normal): 9 Impressions #1 and #2 were communicated to Dr. Rory Percy at 1:50 pmon 11/27/2021 by text page via the [facility] messaging system. IMPRESSION: 1.  No acute intracranial hemorrhage or acute demarcated cortical infarct. 2. Age-indeterminate lacunar infarct within the anterior limb of right internal capsule. 3. Small chronic cortical/subcortical infarct within the right parietooccipital lobes. 4. 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 5. Chronic small-vessel ischemic changes within the cerebral white matter, overall mild but advanced for age. 6. Scattered punctate supratentorial parenchymal calcifications. Findings are nonspecific, but may be seen as sequela of neurocysticercosis. 7. Mild cerebral atrophy, advanced for age. 8. 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 Electronically Signed   By: Kellie Simmering D.O.   On: 11/20/2021 13:51    Microbiology Recent Results (from the past 240 hour(s))  Resp Panel by RT-PCR (Flu A&B, Covid) Nasopharyngeal Swab     Status: Abnormal   Collection Time: 11/29/2021  2:12 PM   Specimen: Nasopharyngeal Swab; Nasopharyngeal(NP) swabs in vial transport medium  Result Value Ref Range Status   SARS Coronavirus 2 by RT PCR POSITIVE (A) NEGATIVE Final    Comment: (NOTE) SARS-CoV-2 target nucleic acids are DETECTED.  The SARS-CoV-2 RNA is generally detectable in upper respiratory specimens during the acute phase of infection. Positive results are indicative of the presence of the identified virus, but do not rule out bacterial infection or co-infection with other pathogens not detected by the test. Clinical correlation with patient history and other diagnostic information is necessary to determine patient infection status. The expected result is Negative.  Fact Sheet for Patients: EntrepreneurPulse.com.au  Fact Sheet for Healthcare Providers: IncredibleEmployment.be  This test is not yet approved or cleared by the [place] FDA and  has been authorized for detection and/or diagnosis of SARS-CoV-2 by FDA under an Emergency Use  Authorization (EUA).  This EUA will remain in effect (meaning this test can  be used) for the duration of  the COVID-19 declaration under Section 564(b)(1) of the A ct, 21 U.S.C. section 360bbb-3(b)(1), unless the authorization is terminated or revoked sooner.     Influenza A by PCR NEGATIVE NEGATIVE Final   Influenza B by PCR NEGATIVE NEGATIVE Final    Comment: (NOTE) The Xpert Xpress SARS-CoV-2/FLU/RSV plus assay is intended as an aid in the diagnosis of influenza from Nasopharyngeal swab specimens and should not be used as a sole basis for treatment. Nasal washings and aspirates are unacceptable for Xpert Xpress SARS-CoV-2/FLU/RSV testing.  Fact Sheet for Patients: EntrepreneurPulse.com.au  Fact Sheet for Healthcare Providers: IncredibleEmployment.be  This test is not yet approved or cleared by the [place] FDA and has been authorized for detection and/or diagnosis of SARS-CoV-2 by FDA under an Emergency Use Authorization (EUA). This EUA will remain in effect (meaning this test can be used) for the duration of the COVID-19 declaration under Section 564(b)(1) of the Act, 21 U.S.C. section 360bbb-3(b)(1), unless the authorization is terminated or revoked.  Performed at [facility], [address] [place], [place] [address]   CULTURE, BLOOD (ROUTINE X 2) w Reflex to ID Panel     Status: None   Collection Time: 11/27/21 10:32 AM   Specimen: BLOOD  Result Value Ref Range Status   Specimen Description BLOOD BLOOD RIGHT HAND  Final   Special Requests   Final    BOTTLES DRAWN AEROBIC AND ANAEROBIC Blood Culture adequate volume   Culture   Final    NO GROWTH 5 DAYS Performed at [facility], [address] [place], [place] [address]    Report Status 12/02/2021 FINAL  Final  CULTURE, BLOOD (ROUTINE X 2) w Reflex to ID Panel     Status: None   Collection Time: 11/27/21 10:32 AM   Specimen: BLOOD  Result Value Ref  Range Status   Specimen Description BLOOD BLOOD RIGHT ARM  Final   Special Requests   Final    BOTTLES DRAWN AEROBIC AND ANAEROBIC Blood Culture adequate volume   Culture   Final    NO GROWTH 5 DAYS Performed at [facility], [address] [place], [place] [address]    Report Status 12/02/2021 FINAL  Final  MRSA Next Gen by PCR, Nasal     Status: None   Collection Time: 11/27/21  4:24 PM   Specimen: Nasal Mucosa; Nasal Swab  Result Value Ref Range Status   MRSA by PCR Next Gen NOT DETECTED NOT DETECTED Final    Comment: (NOTE) The GeneXpert MRSA Assay (FDA approved for NASAL specimens only), is one component of a comprehensive MRSA colonization surveillance program. It is not intended to diagnose MRSA infection nor to guide or monitor treatment for MRSA infections. Test performance is not FDA approved in patients less than 65 years old. Performed at [facility], [address] [place], [place] [address]   CULTURE, BLOOD (ROUTINE X 2) w Reflex to ID Panel     Status: None   Collection Time: 11/28/21 11:42 AM   Specimen: BLOOD  Result Value Ref Range Status   Specimen Description BLOOD BLOOD RIGHT HAND  Final   Special Requests   Final    BOTTLES DRAWN AEROBIC AND ANAEROBIC Blood Culture adequate volume   Culture   Final    NO GROWTH 5 DAYS Performed at [facility], [address], [place], [place] [address]    Report Status 12/03/2021 FINAL  Final  CULTURE, BLOOD (  ROUTINE X 2) w Reflex to ID Panel     Status: None   Collection Time: 11/28/21 11:42 AM   Specimen: BLOOD  Result Value Ref Range Status   Specimen Description BLOOD RIGHT ANTECUBITAL  Final   Special Requests   Final    AEROBIC BOTTLE ONLY Blood Culture results may not be optimal due to an inadequate volume of blood received in culture bottles   Culture   Final    NO GROWTH 5 DAYS Performed at [facility], [address] [place], [place] [address]    Report Status  12/03/2021 FINAL  Final  Wet prep, genital     Status: Abnormal   Collection Time: 11/29/21  9:16 AM   Specimen: Vaginal  Result Value Ref Range Status   Yeast Wet Prep HPF POC NONE SEEN NONE SEEN Final   Trich, Wet Prep NONE SEEN NONE SEEN Final   Clue Cells Wet Prep HPF POC PRESENT (A) NONE SEEN Final   WBC, Wet Prep HPF POC >=10 (A) <10 Final   Sperm NONE SEEN  Final    Comment: Performed at [facility], [address] [place], [place] [address]  Culture, Respiratory w Gram Stain     Status: None   Collection Time: 11/25/2021  2:51 PM   Specimen: Tracheal Aspirate; Respiratory  Result Value Ref Range Status   Specimen Description   Final    TRACHEAL ASPIRATE Performed at [facility], [address], [place], [place] [address]    Special Requests   Final    NONE Performed at [facility], [address] [place], [place] [address]    Gram Stain   Final    FEW WBC PRESENT, PREDOMINANTLY MONONUCLEAR NO ORGANISMS SEEN    Culture   Final    NO GROWTH Performed at [facility], [place], [place], [place] [address]    Report Status Dec 30, 2021 FINAL  Final    Lab Basic Metabolic Panel: Recent Labs  Lab 11/30/21 0314 12/05/2021 0338 11/30/2021 0601 11/30/2021 1810 12/02/21 0407 12/02/21 1600 12/03/21 0359 12/03/21 1100 12/03/21 1510 12/03/21 2049 12/30/21 0308 2021-12-30 0404 Dec 30, 2021 0923 2021/12/30 1534  NA 135   < >  --  133* 133* 134* 134*   < > 137 139 140 140 142 141  K 3.6   < >  --  4.4 4.2 4.2 4.3  --  4.9  --   --  4.4  --  3.9  CL 95*   < >  --  91* 95* 97* 102  --  107  --   --  108  --  109  CO2 20*   < >  --  20* 23 24 24   --  26  --   --  21*  --  19*  GLUCOSE 128*   < >  --  148* 183* 178* 187*  --  175*  --   --  193*  --  209*  BUN 56*   < >  --  53* 45* 37* 32*  --  33*  --   --  32*  --  29*  CREATININE 10.89*   < >  --  10.56* 7.62* 5.59* 4.26*  --  4.02*  --   --  3.06*  --  2.67*  CALCIUM 8.1*   < >  --  8.3*  8.5* 8.7* 8.5*  --  8.2*  --   --  8.7*  --  8.9  MG 2.0  --  1.8  --  2.2  --  2.4  --   --   --   --  2.3  --   --   PHOS 10.8*  --   --  5.8*  --  5.9*  --   --  5.8*  --   --  4.2  --  3.8   < > = values in this interval not displayed.   Liver Function Tests: Recent Labs  Lab 11/30/21 0314 11/30/2021 0338 12/07/2021 1810 12/02/21 0407 12/02/21 1600 12/03/21 0359 12/03/21 1510 12-14-21 0404 12-14-2021 1534  AST 10* 20  --  36  --  30  --  26  --   ALT 5 6  --  8  --  11  --  11  --   ALKPHOS 129* 135*  --  141*  --  131*  --  137*  --   BILITOT 0.6 0.9  --  0.9  --  1.1  --  1.5*  --   PROT 6.8 6.8  --  6.4*  --  6.3*  --  6.7  --   ALBUMIN 2.7* 2.5*   < > 2.1* 2.1* 2.1* 2.0* 2.3* 2.2*   < > = values in this interval not displayed.   No results for input(s): LIPASE, AMYLASE in the last 168 hours. No results for input(s): AMMONIA in the last 168 hours. CBC: Recent Labs  Lab 11/30/21 0430 12/02/2021 0338 11/23/2021 2135 12/02/21 0407 12/03/21 0359 2021/12/14 0404  WBC 30.5* 36.5*  --  39.7* 34.3* 34.7*  HGB 8.8* 9.2* 9.1* 9.0* 8.4* 8.4*  HCT 28.9* 30.1* 29.6* 28.4* 27.0* 28.0*  MCV 101.8* 102.4*  --  98.3 100.4* 104.5*  PLT 267 253  --  231 228 213   Cardiac Enzymes: Recent Labs  Lab 11/28/2021 0601  CKTOTAL 913*   Sepsis Labs: Recent Labs  Lab 11/20/2021 0338 12/14/2021 0339 12/10/2021 0601 12/02/21 0407 12/03/21 0359 Dec 14, 2021 0404  WBC 36.5*  --   --  39.7* 34.3* 34.7*  LATICACIDVEN  --  2.9* 2.1*  --   --   --     Procedures/Operations  11/28/2021 arterial line placement 11/29/2021 ETT placement 12/11/2021 central line placement in Left IJ 11/27/2021 Trialysis HD catheter placement in Right femoral  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

## 2021-12-15 NOTE — Progress Notes (Signed)
I responded to a page from the nurse to provide spiritual support for the patient's family. I arrived at the patient's room, where several family members were present. I shared words of comfort, read from the [place], and led in prayer.    16-Dec-2021 1912  Clinical Encounter Type  Visited With Patient and family together  Visit Type Initial;Spiritual support  Referral From Nurse  Consult/Referral To Chaplain  Spiritual Encounters  Spiritual Needs Sacred text;Prayer;Emotional    Chaplain Dr Redgie Grayer

## 2021-12-15 NOTE — Progress Notes (Signed)
Neuro: unresponsive, pupils fixed and dilated, no response to pain, no gag/cough Resp: stable on ventilator until extubation CV: afebrile, temp management with thermax via CRRT which remained on until time of withdrawal of care GIGU: OG remained clamped throughout the day-removed upon extubation, anuric, no BM Skin: see flowsheets Social: Many family members at the bedside for withdrawing care-transition to end of life.  Events: Family elected to withdrawal care this evening. Extubation at 01-29-18, time of death 46. She passed peacefully and was surrounded by loved ones.

## 2021-12-15 NOTE — Progress Notes (Signed)
Patient extubated to room air per order, RN and Family at bedside.

## 2021-12-15 NOTE — Progress Notes (Signed)
NAME:  [name], MRN:  [id number], DOB:  1986-04-11, LOS: 8 ADMISSION DATE:  11/25/2021, CONSULTATION DATE:  11/27/21 REFERRING MD:  Dr. Loleta Books, CHIEF COMPLAINT:  CODE STROKE   History of Present Illness:  36 yo F presenting to [facility] ED with complaints of difficulty speaking & left sided hemiparesis around 11:30 AM on 11/30/2021.  (All information gathered from chart review and attending/EDP documentation as patient is unable to participate in interview and there is no family at bedside.) Patient was originally scheduled for a vascular procedure on 12/07/2021 and prior to arriving she had taken antihypertensives at which point her mother states the patient became confused and had difficulty speaking as well as difficulty moving her left side.   ED course: CODE STROKE initiated patient evaluated by neurology.  Patient speech had improved as well as left-sided weakness, however she remains encephalopathic. MRI showed acute and subacute CVA in multiple vascular territories and the patient was admitted by [facility] for further stroke work-up.  It was also noted on admission the patient was pulseless in the right foot for which vascular surgery was consulted. medications given: Patient was started on heparin drip per vascular surgery recommendation of which neurology is aware. Initial Vitals: Afebrile at 98.8, mildly tachypneic at 21, NSR at 88, BP soft 85/74 > 95/74 with SPO2 88% on 2 L nasal cannula. Significant labs: (Labs/ Imaging personally reviewed) I, Domingo Pulse Rust-Chester, AGACNP-BC, personally viewed and interpreted this ECG. EKG Interpretation: Date: 11/20/2021, EKG Time: 14:37, Rate: 94, Rhythm: NSR, QRS Axis: Normal, Intervals: Prolonged QTc, ST/T Wave abnormalities: None, Narrative Interpretation: NSR with nonspecific intraventricular block and baseline wander Chemistry: Cl: 94, BUN/Cr.:  65/11.34, Serum CO2/ AG: 20/26, alk phos: 131, albumin: 2.8, CRP: 5.9 Hematology: WBC: 16.6, Hgb: 9.4,   COVID-19 & Influenza A/B: COVID-positive CXR 11/21/2021: Cardiomegaly with mild pulmonary congestion X-ray left foot 2 views 11/24/2021: postsurgical changes of transmetatarsal amputation of the first through fifth rays. new bony irregularity along the resection margins of the third, fourth and fifth metatarsals concerning for osteomyelitis MRI brain without contrast 11/21/2021: Subcentimeter acute cortically based infarcts with the lateral right frontal lobe and posterior left frontal lobe. patchy small acute infarcts within the callosal splenium on the right additional small recent infarcts (measuring up to 12 mm) are questioned within the left frontoparietal white matter given the small infarcts involving multiple vascular territories there is a suspicion for embolic process. small chronic cortical and subcortical infarct within the right parietoccipital lobes.  Chronic hemorrhagic lacunar infarct within the left caudate nucleus.  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  Mild mucosal thickening within the bilateral ethmoid sinuses.  Trapped fluid within the left petrous apex.  Hospital course: [facility] consulted for admission and neurology consulted for acute /subacute multiple infarcts.  Nephrology consulted due to peritoneal dialysis requirements.  Vascular surgery consulted for assistance with right lower extremity ischemia.  Patient was started on heparin drip with additional imaging and work-up for acute and subacute CVA.  Antibiotics started for concerns of osteomyelitis in the foot.  Echocardiogram 11/27/2021: LVEF 55 to 60%, no regional wall motion abnormalities, mild concentric LVH, elevated LA pressure, RV systolic function is normal, LA size severely dilated. Restricted anterior and posterior mitral valve leaflet motion compared with the echo in 2017 with concern for endocarditis. Moderate MR, Severe MS, moderate-severe TR, no AS.   11/27/21: Early in the day neurology  documented the patient has aphasic but inact in terms of  following commands, with improved left sided hemiparesis and new right arm drift. Per TRH documentation, patient received Dilaudid for severe pain in ischemic LE, then waking up from a nap more confused with more Right sided hemiparesis. STAT CTH ordered. While in CT patient became acutely hypoxic when laid flat, desaturations in the 70's. Rapid response called and patient transferred to SDU for monitoring. CT head wo contrast 11/27/21: No new intracranial abnormalities  PCCM consulted for additional management and monitoring while in SDU.  Pertinent  Medical History  PAD Anemia of chronic disease T2DM Asthma ESRD on PD HFpEF HTN Obesity calciphylaxis  Significant Hospital Events: Including procedures, antibiotic start and stop dates in addition to other pertinent events   12/06/2021: Admit to inpatient as CODE STROKE with multiple  acute/subacute infarcts on MRI, ESRD on PD, acute RLE ischemia & suspected osteomyelitis on L foot with Neurology, Nephrology & Vascular surgery following 11/27/21: Echo concerning for endocarditis, new confusion/right sided hemiparesis-STAT CT ordered, but when patient laid flat became hypoxic- rapid response to SDU with PCCM consult. 11/28/21: US Venous Bilateral revealed No evidence of deep venous thrombosis in either lower extremity to the level of the knees. Calf veins are not well seen due to patient body habitus and subcutaneous edema. 11/28/21: possible endocarditis.  She also has severe comorbid conditions that require multiple specialists involved. Together the entire medical team reviewed plan and agreement to transfer to [facility] cone for additional evaluation and management. reviewed medical plan with family including mother Kalista Laguardia.  11/29/21: remains encephalopathic, increased WOB, emergently intubated, family updated 2/14 remains on vent, pressors, per Nephrology PD overnight 11/18/2021: Pt  with worsening hypotension overnight requiring levophed/neo-synephrine/vasopressin gtts.  Also, vent dyssynchrony overnight no amenable to vent changes or increased sedation requiring vecuronium x1 dose. CRRT started.  pupils were unreactive and CT head showed new multifocal areas of stroke some of which are large territory. 2/16:  Continues on CRRT.  Continues to require Levophed and Vasopressin. 2/17: Worsened Neuro exam (no cough/gag, pupils 5 mm and sluggish), STAT repeat CTH shows evolving right sided infarcts and left MCA infarct, new infarct of the Right cerebellar hemisphere in SCA distribution, increasing mass effect with new 9 mm leftward midline shift. Neurology following, given Hypertonic saline bolus and infusion.  Plan for family meeting 2/17: CT Head revealed ongoing evolution of extensive ischemia in the right cerebral hemisphere with increased indistinctness of the right basal ganglia, and worsened ischemia in the right superior frontal lobe. There is relative sparing of the anterior frontal lobe. Additional evolving infarcts in the left MCA distribution. New infarct in the right cerebellar hemisphere in the SCA distribution. Mass effect from the right-sided infarcts has increased, with new effacement of the right lateral ventricle and basal cisterns, and new 9 mm leftward midline shift. No evidence of acute intracranial hemorrhage.  Cultures:  Respiratory Panel by RT-PCR 02/10>>positive for COVID; negative influenza A/B/ MRSA PCR 02/11>>negative  Blood x2 02/11>>negative Blood x2 02/12>>negative  Genital Wet Prep 02/13>>clue cells present and wbc >=10  Interim History / Subjective:  No acute events overnight.  Neuro  Objective   Blood pressure (!) 69/39, pulse (!) 114, temperature 98.8 F (37.1 C), resp. rate (!) 26, height 5' 6" (1.676 m), weight (!) 139.2 kg, SpO2 94 %.    Vent Mode: PRVC FiO2 (%):  [40 %-100 %] 100 % Set Rate:  [20 bmp-26 bmp] 26 bmp Vt Set:  [400 mL-410  mL] 410 mL PEEP:  [5 Globe  Pressure:  [20 cmH20-25 cmH20] 20 cmH20   Intake/Output Summary (Last 24 hours) at 21-Dec-2021 0859 Last data filed at 12/21/21 0800 Gross per 24 hour  Intake 3584.87 ml  Output 2956 ml  Net 628.87 ml   Filed Weights   12/02/21 0317 12/03/21 0425 12-21-2021 0315  Weight: (!) 138 kg (!) 137.8 kg (!) 139.2 kg    Examination: General: Critically ill appearing female, sedated/mechanically intubated NAD  HEENT: Supple, no JVD, moist mucous membranes  Neuro: sedated on low dose fentanyl, not following commands, NO COUGH/GAG REFLEXES/CORNEAL REFLEXES, right pupil dilated non reactive/left 4 mm non reactive  CV: Tachycardia, regular rhythm, S1S2, no M/R/G, trace bilateral lower extremity edema, 1+ via doppler bilateral radial/bilateral popliteal pulses Pulm: Diminished breath sounds bilaterally, occasionally overbreathes the vent, even, non labored  GI: soft, rounded, non distended, bs x 4 Extremities: 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; left stump dressing present with serosanguinous drainage present   Resolved Hospital Problem list     Assessment & Plan:   Acute Metabolic Encephalopathy Multiple Acute/subacute infarcts most likely embolic ~ progressing now with new mass effect with 9 mm leftward shift~Per neurology new CT Head findings not compatible with life due to stroke burden  Sedation needs in setting of mechanical ventilation PMHx: HLD, HTN -Maintain a RASS goal of 0 to -1 -Fentanyl gtt as needed to maintain RASS goal -Avoid sedating medications as able -Daily wake up assessment -Neurology following, appreciate input ~ will follow recommendations -TEE once stable -SBP goal 100-140 -Continue heparin stroke protocol  -Follow Hypertonic Saline protocol (drip rate currently _0  ml/hr) for Cerebral Edema/Impending Herniation -Follow serum Na q6h -Goal Serum Na 150-155 (hold hypertonic saline if serum Na >160)  Septic Shock ?  Mitral Valve Endocarditis~Echo Limited Bubble Study 02/17 confirmed septal defect  Mitral Stenosis -Continuous cardiac monitoring -Maintain MAP >65 -Vasopressors as needed to maintain MAP goal -Stress dose steroids - Cardiology following,  appreciate input  Severe Sepsis due to possible osteomyelitis of the Left TMA, possible mitral valve endocarditis, and ischemic right foot  Mitral stenosis  -Monitor fever curve -Trend WBC's & Procalcitonin -Follow cultures as above -ID consulted appreciate input~continue empiric Cefepime, Flagyl, & Vancomycin pending cultures & sensitivities -Vascular Surgery following, appreciate input ~ 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  Type 2 Diabetes Mellitus Controlled, HgbA1c: 5.1  - Q 4 CBG monitoring - SSI  - Target CBG range 140-180 - Follow ICU hypo/hyper-glycemia protocol  Acute Hypoxic Respiratory Failure in the setting of COVID-19 infection & multiple metabolic derangements, and acute CVA  - Mechanical ventilation via ARDS protocol, target PRVC 6 cc/kg - Wean PEEP and FiO2 as able to maintain O2 sats >88% - Goal plateau pressure less than 30, driving pressure less than 15 - Paralytics if necessary for vent synchrony, gas exchange - Cycle prone positioning if necessary for oxygenation - Deep sedation per PAD protocol, goal RASS -3 to -4 for now, currently fentanyl, midazolam - Diuresis as blood pressure and renal function can tolerate, goal CVP 5-8.   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  Therefore, left voicemail message instructing her to return my phone call.  Will recommend transitioning pt to comfort care measures due to CT Head findings.  Labs   CBC: Recent Labs  Lab 11/30/21 0430 11/23/2021 0338 11/30/2021 2135 12/02/21 0407 12/03/21 0359 12-25-21 0404  WBC 30.5* 36.5*  --  39.7* 34.3* 34.7*  HGB 8.8* 9.2* 9.1* 9.0* 8.4* 8.4*  HCT 28.9* 30.1* 29.6* 28.4* 27.0* 28.0*  MCV 101.8* 102.4*  --  98.3 100.4* 104.5*  PLT 267 253  --  231 228 193    Basic Metabolic Panel: Recent Labs  Lab 11/30/21 0314 11/17/2021 0338 11/21/2021 0601 12/03/2021 1810 12/02/21 0407 12/02/21 1600 12/03/21 0359 12/03/21 1100 12/03/21 1510 12/03/21 2049 12/25/21 0308 12/25/2021 0404  NA 135   < >  --  133* 133* 134* 134* 135 137 139 140 140  K 3.6   < >  --  4.4 4.2 4.2 4.3  --  4.9  --   --  4.4  CL 95*   < >  --  91* 95* 97* 102  --  107  --   --  108  CO2 20*   < >  --  20* _0 --  26  --   --  21*  GLUCOSE 128*   < >  --  148* 183* 178* 187*  --  175*  --   --  193*  BUN 56*   < >  --  53* 45* 37* 32*  --  33*  --   --  32*  CREATININE 10.89*   < >  --  10.56* 7.62* 5.59* 4.26*  --  4.02*  --   --  3.06*  CALCIUM 8.1*   < >  --  8.3* 8.5* 8.7* 8.5*  --  8.2*  --   --  8.7*  MG 2.0  --  1.8  --  2.2  --  2.4  --   --   --   --  2.3  PHOS 10.8*  --   --  5.8*  --  5.9*  --   --  5.8*  --   --  4.2   < > = values in this interval not  displayed.   GFR: Estimated Creatinine Clearance: 36.6 mL/min (A) (by C-G formula based on SCr of 3.06 mg/dL (H)). Recent Labs  Lab 11/30/2021 0338 12/14/2021 0339 11/23/2021 0601 12/02/21 0407 12/03/21 0359 25-Dec-2021 0404  WBC 36.5*  --   --  39.7* 34.3* 34.7*  LATICACIDVEN  --  2.9* 2.1*  --   --   --     Liver Function Tests: Recent Labs  Lab 11/30/21 0314 11/17/2021 0338 12/12/2021 1810 12/02/21 0407 12/02/21 1600 12/03/21 0359 12/03/21 1510 25-Dec-2021 0404  AST 10* 20  --  36  --  30  --  26  ALT 5 6  --  8  --  11  --  11  ALKPHOS 129* 135*  --  141*  --  131*  --  137*  BILITOT 0.6 0.9  --  0.9  --  1.1  --  1.5*  PROT 6.8 6.8  --  6.4*  --  6.3*  --  6.7  ALBUMIN 2.7* 2.5*   < > 2.1* 2.1* 2.1* 2.0* 2.3*   < > = values in this interval not displayed.   No results for input(s): LIPASE, AMYLASE in the last 168 hours. No results for input(s): AMMONIA in the last 168 hours.  ABG    Component Value Date/Time   PHART 7.18 (LL) 12/03/2021 1826   PCO2ART 61 (H) 12/03/2021 1826   PO2ART 100 12/03/2021 1826   HCO3 22.8 12/03/2021 1826   TCO2 26 09/08/2021 1418   ACIDBASEDEF 6.5 (H) 12/03/2021 1826   O2SAT 98.8 12/03/2021 1826      Coagulation Profile: No results for input(s): INR, PROTIME in the last 168 hours.  Cardiac Enzymes: Recent Labs  Lab 11/17/2021 0601  CKTOTAL 913*    HbA1C: Hgb A1c MFr Bld  Date/Time Value Ref Range Status  11/30/2021 06:30 PM 5.1 4.8 - 5.6 % Final    Comment:    (NOTE) Pre diabetes:          5.7%-6.4%  Diabetes:              >6.4%  Glycemic control for   <7.0% adults with diabetes   11/24/2021 10:05 AM 5.0 4.8 - 5.6 % Final    Comment:    (NOTE) Pre diabetes:          5.7%-6.4%  Diabetes:              >6.4%  Glycemic control for   <7.0% adults with diabetes     CBG: Recent Labs  Lab 12/03/21 1614 12/03/21 1931 12/03/21 2305 12-09-2021 0308 2021-12-09 0809  GLUCAP 151* 117* 132* 128* 191*    Review of Systems:    Unable to assess pt mechanically intubated, unresponsive.   Past Medical History:  She,  has a past medical history of Anemia of chronic disease, Asthma, Calciphylaxis (2020), CHF (congestive heart failure) ([place]), CKD (chronic kidney disease), stage III ([place]), Diabetes ([place]), Dyspnea, Hypertension, Morbid obesity ([place]), Nephrotic syndrome, PAD (peripheral artery disease) ([place]), and Sepsis ([place]) (06/2021).   Surgical History:   Past Surgical History:  Procedure Laterality Date   A/V FISTULAGRAM Left 10/29/2020   Procedure: A/V FISTULAGRAM;  Surgeon: Algernon Huxley, MD;  Location: [facility];  Service: Cardiovascular;  Laterality: Left;   AMPUTATION Bilateral 11/20/2021   Procedure: AMPUTATION BELOW KNEE-Guillotine;  Surgeon: Algernon Huxley, MD;  Location: [facility];  Service: Vascular;  Laterality: Bilateral;   APPLICATION OF WOUND VAC Bilateral 09/15/2019   Procedure: APPLICATION OF WOUND VAC;  Surgeon: Benjamine Sprague, DO;  Location: [facility];  Service: General;  Laterality: Bilateral;   AV FISTULA PLACEMENT Left 07/09/2020   Procedure: ARTERIOVENOUS (AV) FISTULA CREATION ( BRACHIAL CEPHALIC);  Surgeon: Algernon Huxley, MD;  Location: [facility];  Service: Vascular;  Laterality: Left;   DIALYSIS/PERMA CATHETER INSERTION N/A 02/28/2019   Procedure: DIALYSIS/PERMA CATHETER INSERTION;  Surgeon: Algernon Huxley, MD;  Location: [place] [facility];  Service: Cardiovascular;  Laterality: N/A;   DIALYSIS/PERMA CATHETER INSERTION N/A 09/17/2019   Procedure: DIALYSIS/PERMA CATHETER EXCHANGE;  Surgeon: Katha Cabal, MD;  Location: [facility] [facility];  Service: Cardiovascular;  Laterality: N/A;   DIALYSIS/PERMA CATHETER REMOVAL N/A 03/18/2020   Procedure: DIALYSIS/PERMA CATHETER REMOVAL;  Surgeon: Algernon Huxley, MD;  Location: [place] [facility];  Service: Cardiovascular;  Laterality: N/A;   LIGATION OF ARTERIOVENOUS  FISTULA Left 06/03/2021   Procedure: LIGATION OF ARTERIOVENOUS  FISTULA;  Surgeon: Algernon Huxley, MD;  Location: [facility];  Service: Vascular;  Laterality: Left;   LOWER EXTREMITY ANGIOGRAPHY Left 12/02/2020   Procedure: LOWER EXTREMITY ANGIOGRAPHY;  Surgeon: Algernon Huxley, MD;  Location: [place] [facility];  Service: Cardiovascular;  Laterality: Left;   LOWER EXTREMITY ANGIOGRAPHY Right 01/25/2021   Procedure: LOWER EXTREMITY ANGIOGRAPHY;  Surgeon: Algernon Huxley, MD;  Location: [place] [facility];  Service: Cardiovascular;  Laterality: Right;   LOWER EXTREMITY ANGIOGRAPHY Left 04/15/2021   Procedure: LOWER EXTREMITY ANGIOGRAPHY;  Surgeon: Algernon Huxley, MD;  Location: [place] [facility];  Service: Cardiovascular;  Laterality: Left;   LOWER EXTREMITY ANGIOGRAPHY Right 05/20/2021   Procedure: LOWER EXTREMITY ANGIOGRAPHY;  Surgeon: Algernon Huxley, MD;  Location: [place] [facility];  Service: Cardiovascular;  Laterality: Right;   LOWER EXTREMITY ANGIOGRAPHY Left 05/27/2021   Procedure: LOWER EXTREMITY ANGIOGRAPHY;  Surgeon: Algernon Huxley, MD;  Location: [place] [facility];  Service: Cardiovascular;  Laterality: Left;   LOWER EXTREMITY ANGIOGRAPHY Right 08/04/2021   Procedure: LOWER EXTREMITY ANGIOGRAPHY;  Surgeon: Algernon Huxley, MD;  Location: [place] [facility] [facility];  Service: Cardiovascular;  Laterality: Right;   LOWER EXTREMITY ANGIOGRAPHY Left 11/15/2021   Procedure: LOWER EXTREMITY ANGIOGRAPHY;  Surgeon: Algernon Huxley, MD;  Location: [place] [facility];  Service: Cardiovascular;  Laterality: Left;   pd cath  05/21/2019   TENDON LENGTHENING Left 09/08/2021   Procedure: TENDON ACHILLES LENGTHENING;  Surgeon: Criselda Peaches, DPM;  Location: [facility];  Service: Podiatry;  Laterality: Left;   TONSILLECTOMY     TRANSMETATARSAL AMPUTATION Left 09/08/2021   Procedure: TRANSMETATARSAL AMPUTATION;  Surgeon: Criselda Peaches, DPM;  Location: [facility];  Service: Podiatry;  Laterality: Left;   UPPER EXTREMITY ANGIOGRAPHY Left 04/15/2021   Procedure: UPPER EXTREMITY ANGIOGRAPHY;  Surgeon: Algernon Huxley, MD;  Location: [place] [facility];  Service: Cardiovascular;  Laterality: Left;   WOUND DEBRIDEMENT Bilateral 09/15/2019   Procedure: DEBRIDEMENT WOUND;  Surgeon: Benjamine Sprague, DO;  Location: [facility];  Service: General;  Laterality: Bilateral;     Social History:   reports that she has quit smoking. Her smoking use included cigars. She has never used smokeless tobacco. She reports that she does not drink alcohol and does not use drugs.   Family History:  Her family history includes CAD in her mother; Cancer in her maternal grandfather and maternal grandmother; Hypertension in her mother; Kidney disease in her father; Multiple sclerosis in her mother.   Allergies Allergies  Allergen Reactions   Percocet [Oxycodone-Acetaminophen] Itching   Tomato Itching   Gabapentin Other (See Comments)    Extremity tremors     Home Medications  Prior to Admission medications   Medication Sig Start Date End Date Taking? Authorizing Provider  amLODipine (NORVASC) 10 MG tablet Take 10 mg by mouth daily as needed (elevated blood pressure).   Yes [provider]  aspirin EC 81 MG EC tablet Take 1 tablet (81 mg total) by mouth daily. 02/16/16  Yes Demetrios Loll, MD  atorvastatin (LIPITOR) 20 MG tablet Take 1 tablet (20 mg total) by mouth daily. 11/15/21  Yes Dew, Erskine Squibb, MD  carvedilol (COREG) 25 MG tablet Take 25 mg by mouth 2 (two) times daily as needed (elevated blood pressure).   Yes [provider]  clopidogrel (PLAVIX) 75 MG tablet Take 1 tablet (75 mg total) by mouth daily. 11/15/21  Yes Dew, Erskine Squibb, MD  isosorbide mononitrate (IMDUR) 30 MG 24 hr tablet Take 30 mg by mouth 2 (two) times daily as needed (elevated blood pressure).   Yes [provider]  losartan (COZAAR) 100 MG tablet Take 100 mg by mouth daily as needed (elevated blood pressure).   Yes [provider]  cilostazol (PLETAL) 100 MG tablet Take 1 tablet (100 mg total) by mouth 2 (two) times daily  before a meal. Patient not taking: Reported on 11/24/2021 05/07/21   Kris Hartmann, NP  lactulose (CHRONULAC) 10 GM/15ML solution Take 30 mLs (20 g total) by mouth 2 (two) times daily as needed for mild constipation. Patient not taking: Reported on 11/20/2021 07/09/21   Edwin Dada, MD  multivitamin (RENA-VIT) TABS tablet Take 1 tablet by mouth at bedtime. Patient not taking: Reported on 11/15/2021 09/18/19   Fritzi Mandes, MD  ondansetron (ZOFRAN) 4 MG tablet Take 1 tablet (4 mg total) by mouth every 6 (six) hours as needed for nausea. Patient not taking: Reported on 11/09/2021 09/08/21   Criselda Peaches, DPM     Critical care time: 57 minutes    Rosilyn Mings, [place] Pager [phone] (please enter 7 digits) PCCM Consult Pager [phone] (please enter 7 digits)

## 2021-12-15 NOTE — Consult Note (Signed)
[place] for heparin Indication:  Thrombus  Allergies  Allergen Reactions   Percocet [Oxycodone-Acetaminophen] Itching   Tomato Itching   Gabapentin Other (See Comments)    Extremity tremors    Patient Measurements: Height: 5\' 6"  (167.6 cm) Weight: (!) 139.2 kg (306 lb 14.1 oz) IBW/kg (Calculated) : 59.3 Heparin Dosing Weight: 92.3 Kg  Vital Signs: Temp: 97.7 F (36.5 C) (02/18 1700) Temp Source: Esophageal (02/18 1600) Pulse Rate: 109 (02/18 1700)  Labs: Recent Labs    12/02/21 0407 12/02/21 1600 12/03/21 0359 12/03/21 1510 12-14-2021 0404 December 14, 2021 1534  HGB 9.0*  --  8.4*  --  8.4*  --   HCT 28.4*  --  27.0*  --  28.0*  --   PLT 231  --  228  --  213  --   HEPARINUNFRC  --    < > 0.48  --  <0.10* 0.69  CREATININE 7.62*   < > 4.26* 4.02* 3.06* 2.67*   < > = values in this interval not displayed.     Estimated Creatinine Clearance: 42 mL/min (A) (by C-G formula based on SCr of 2.67 mg/dL (H)).   Medical History: Past Medical History:  Diagnosis Date   Anemia of chronic disease    Asthma    well controlled-no inhaler   Calciphylaxis 2020   CHF (congestive heart failure) ([place])    CKD (chronic kidney disease), stage III ([place])    Diabetes ([place])    type 2-diet controlled   Dyspnea    Hypertension    Morbid obesity ([place])    Nephrotic syndrome    PAD (peripheral artery disease) ([place])    Sepsis ([place]) 06/2021    Medications:  No chronic anticoagulation PTA Heparin Dosing Weight: 92.3 Kg  Assessment: Pharmacy has been consulted to initiate and monitor heparin in 35yo patient admitted with stroke-like symptoms. Her symptoms began to resolve, but neurologist noted concern for lower extremity ischemia, likely embolic. H&H, platelets have been stable. 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  Goal of Therapy:  Heparin level 0.3-0.5 units/ml (Per Stroke  Protocol) Monitor platelets by anticoagulation protocol: Yes  Date Time HL  Comment/rate 2/10 2222 HL < 0.10, subtherapeutic 2/11 0906 HL 0.12, subtherapeutic 2/11 2048 HL 0.22, subtherapeutic  2/12 0602 HL 0.44, therapeutic x 1 2/12 1500 HL 0.29, subtherapeutic; 2100>2200 un/hr 2/12 2356 HL 0.36, therapeutic x 1; 2200 un/hr 2/13 0916 HL 0.40, therapeutic x2; 2200 un/hr 2/14 0314 HL 0.51: 2200 >> 2100 units/hr 2/14 1339 HL 0.32: 2100 units/hr 2/14 2226 HL 0.26: 2100 >>2200 units/hr 2/15 0601 HL 0.24  2200 >>2300 units/hr 2/16 2046 HL 0.42 Therapeutic x 1 @ 2300 units/hr 2/17 0359 HL 0.48 Therapeutic X 2 @ 2300 units/hr 2/18 0404 HL < 0.1 SUBtherapeutic - heparin gtt stopped on 2/17 @ 1748 2/18 1534 HL 0.69 SUPRA therapeutic   Plan:  Heparin level supratherapeutic. Will decrease heparin infusion to 2100 units/hr. Recheck heparin level 8 hours after rate change. CBC daily while on heparin.   Sherilyn Banker, PharmD Clinical Pharmacist  2021/12/14 5:22 PM

## 2021-12-15 DEATH — deceased

## 2021-12-20 ENCOUNTER — Encounter: Payer: Medicare Other | Admitting: Podiatry

## 2022-01-12 ENCOUNTER — Encounter: Payer: Medicare Other | Admitting: Podiatry
